# Patient Record
Sex: Male | Born: 1945 | Race: White | Hispanic: No | Smoking: Former smoker
Health system: Southern US, Community
[De-identification: ages and names within clinical notes are randomized; demographics above are authoritative.]

## PROBLEM LIST (undated history)

## (undated) DIAGNOSIS — I10 Essential (primary) hypertension: Secondary | ICD-10-CM

## (undated) DIAGNOSIS — C4491 Basal cell carcinoma of skin, unspecified: Secondary | ICD-10-CM

## (undated) DIAGNOSIS — G35 Multiple sclerosis: Secondary | ICD-10-CM

## (undated) DIAGNOSIS — F32A Depression, unspecified: Secondary | ICD-10-CM

## (undated) DIAGNOSIS — G4733 Obstructive sleep apnea (adult) (pediatric): Secondary | ICD-10-CM

## (undated) DIAGNOSIS — E785 Hyperlipidemia, unspecified: Secondary | ICD-10-CM

## (undated) DIAGNOSIS — Z9289 Personal history of other medical treatment: Secondary | ICD-10-CM

## (undated) DIAGNOSIS — R42 Dizziness and giddiness: Secondary | ICD-10-CM

## (undated) DIAGNOSIS — R7303 Prediabetes: Secondary | ICD-10-CM

## (undated) DIAGNOSIS — F329 Major depressive disorder, single episode, unspecified: Secondary | ICD-10-CM

## (undated) DIAGNOSIS — I2510 Atherosclerotic heart disease of native coronary artery without angina pectoris: Secondary | ICD-10-CM

## (undated) DIAGNOSIS — G35D Multiple sclerosis, unspecified: Secondary | ICD-10-CM

## (undated) HISTORY — PX: COLONOSCOPY: SHX174

## (undated) HISTORY — DX: Obstructive sleep apnea (adult) (pediatric): G47.33

## (undated) HISTORY — PX: TONSILLECTOMY: SHX5217

## (undated) HISTORY — DX: Atherosclerotic heart disease of native coronary artery without angina pectoris: I25.10

## (undated) HISTORY — PX: CATARACT EXTRACTION, BILATERAL: SHX1313

## (undated) HISTORY — DX: Multiple sclerosis, unspecified: G35.D

## (undated) HISTORY — DX: Essential (primary) hypertension: I10

## (undated) HISTORY — DX: Major depressive disorder, single episode, unspecified: F32.9

## (undated) HISTORY — DX: Dizziness and giddiness: R42

## (undated) HISTORY — DX: Hyperlipidemia, unspecified: E78.5

## (undated) HISTORY — DX: Personal history of other medical treatment: Z92.89

## (undated) HISTORY — DX: Multiple sclerosis: G35

## (undated) HISTORY — DX: Basal cell carcinoma of skin, unspecified: C44.91

## (undated) HISTORY — DX: Depression, unspecified: F32.A

---

## 1998-06-26 ENCOUNTER — Ambulatory Visit (HOSPITAL_COMMUNITY): Admission: RE | Admit: 1998-06-26 | Discharge: 1998-06-26 | Payer: Self-pay | Admitting: Internal Medicine

## 1998-06-26 ENCOUNTER — Encounter: Payer: Self-pay | Admitting: Internal Medicine

## 1999-01-14 HISTORY — PX: CORONARY STENT PLACEMENT: SHX1402

## 1999-05-16 ENCOUNTER — Inpatient Hospital Stay (HOSPITAL_COMMUNITY): Admission: RE | Admit: 1999-05-16 | Discharge: 1999-05-17 | Payer: Self-pay | Admitting: *Deleted

## 2003-02-05 ENCOUNTER — Ambulatory Visit (HOSPITAL_BASED_OUTPATIENT_CLINIC_OR_DEPARTMENT_OTHER): Admission: RE | Admit: 2003-02-05 | Discharge: 2003-02-05 | Payer: Self-pay | Admitting: Internal Medicine

## 2003-02-05 ENCOUNTER — Encounter: Payer: Self-pay | Admitting: Internal Medicine

## 2003-11-24 ENCOUNTER — Ambulatory Visit: Payer: Self-pay | Admitting: Psychology

## 2003-12-12 ENCOUNTER — Ambulatory Visit: Payer: Self-pay | Admitting: Psychology

## 2003-12-25 ENCOUNTER — Ambulatory Visit: Payer: Self-pay | Admitting: Psychology

## 2004-05-15 ENCOUNTER — Ambulatory Visit: Payer: Self-pay | Admitting: *Deleted

## 2004-12-26 ENCOUNTER — Ambulatory Visit: Payer: Self-pay | Admitting: *Deleted

## 2004-12-31 ENCOUNTER — Ambulatory Visit: Payer: Self-pay | Admitting: *Deleted

## 2005-08-07 ENCOUNTER — Ambulatory Visit: Payer: Self-pay | Admitting: Internal Medicine

## 2005-08-13 ENCOUNTER — Ambulatory Visit: Payer: Self-pay | Admitting: *Deleted

## 2005-08-20 ENCOUNTER — Ambulatory Visit: Payer: Self-pay | Admitting: *Deleted

## 2005-12-08 ENCOUNTER — Ambulatory Visit: Payer: Self-pay | Admitting: Internal Medicine

## 2005-12-08 LAB — CONVERTED CEMR LAB
ALT: 49 units/L — ABNORMAL HIGH (ref 0–40)
AST: 33 units/L (ref 0–37)
Albumin: 3.8 g/dL (ref 3.5–5.2)
Alkaline Phosphatase: 48 units/L (ref 39–117)
BUN: 14 mg/dL (ref 6–23)
Basophils Absolute: 0.1 10*3/uL (ref 0.0–0.1)
Basophils Relative: 0.8 % (ref 0.0–1.0)
Bilirubin Urine: NEGATIVE
CO2: 30 meq/L (ref 19–32)
Calcium: 9.3 mg/dL (ref 8.4–10.5)
Chloride: 110 meq/L (ref 96–112)
Chol/HDL Ratio, serum: 3.4
Cholesterol: 165 mg/dL (ref 0–200)
Creatinine, Ser: 1.1 mg/dL (ref 0.4–1.5)
Eosinophil percent: 4.3 % (ref 0.0–5.0)
GFR calc non Af Amer: 73 mL/min
Glomerular Filtration Rate, Af Am: 88 mL/min/{1.73_m2}
Glucose, Bld: 118 mg/dL — ABNORMAL HIGH (ref 70–99)
HCT: 45.9 % (ref 39.0–52.0)
HDL: 48.4 mg/dL (ref >39.0)
Hemoglobin, Urine: NEGATIVE
Hemoglobin: 15.7 g/dL (ref 13.0–17.0)
Ketones, ur: NEGATIVE mg/dL
LDL Cholesterol: 91 mg/dL (ref 0–99)
Leukocytes, UA: NEGATIVE
Lymphocytes Relative: 28.4 % (ref 12.0–46.0)
MCHC: 34.1 g/dL (ref 30.0–36.0)
MCV: 95 fL (ref 78.0–100.0)
Monocytes Absolute: 0.8 10*3/uL — ABNORMAL HIGH (ref 0.2–0.7)
Monocytes Relative: 10 % (ref 3.0–11.0)
Neutro Abs: 4.6 10*3/uL (ref 1.4–7.7)
Neutrophils Relative %: 56.5 % (ref 43.0–77.0)
Nitrite: NEGATIVE
PSA: 0.51 ng/mL (ref 0.10–4.00)
Platelets: 212 10*3/uL (ref 150–400)
Potassium: 5.5 meq/L — ABNORMAL HIGH (ref 3.5–5.1)
RBC: 4.83 M/uL (ref 4.22–5.81)
RDW: 12.4 % (ref 11.5–14.6)
Sodium: 143 meq/L (ref 135–145)
Specific Gravity, Urine: 1.025 (ref 1.000–1.03)
TSH: 1.89 microintl units/mL (ref 0.35–5.50)
Total Bilirubin: 0.9 mg/dL (ref 0.3–1.2)
Total Protein, Urine: NEGATIVE mg/dL
Total Protein: 6.3 g/dL (ref 6.0–8.3)
Triglyceride fasting, serum: 130 mg/dL (ref 0–149)
Urine Glucose: NEGATIVE mg/dL
Urobilinogen, UA: 0.2 (ref 0.0–1.0)
VLDL: 26 mg/dL (ref 0–40)
WBC: 8.2 10*3/uL (ref 4.5–10.5)
pH: 6 (ref 5.0–8.0)

## 2005-12-15 ENCOUNTER — Ambulatory Visit: Payer: Self-pay | Admitting: Internal Medicine

## 2006-01-23 ENCOUNTER — Ambulatory Visit: Payer: Self-pay | Admitting: Gastroenterology

## 2006-08-21 ENCOUNTER — Ambulatory Visit: Payer: Self-pay | Admitting: Internal Medicine

## 2006-09-22 ENCOUNTER — Ambulatory Visit: Payer: Self-pay | Admitting: Cardiology

## 2006-09-29 ENCOUNTER — Ambulatory Visit: Payer: Self-pay

## 2007-07-06 ENCOUNTER — Ambulatory Visit: Payer: Self-pay | Admitting: Internal Medicine

## 2007-07-06 DIAGNOSIS — R252 Cramp and spasm: Secondary | ICD-10-CM | POA: Insufficient documentation

## 2007-07-06 DIAGNOSIS — I2510 Atherosclerotic heart disease of native coronary artery without angina pectoris: Secondary | ICD-10-CM | POA: Insufficient documentation

## 2007-07-06 DIAGNOSIS — I10 Essential (primary) hypertension: Secondary | ICD-10-CM | POA: Insufficient documentation

## 2007-07-06 DIAGNOSIS — I152 Hypertension secondary to endocrine disorders: Secondary | ICD-10-CM | POA: Insufficient documentation

## 2007-07-06 DIAGNOSIS — F339 Major depressive disorder, recurrent, unspecified: Secondary | ICD-10-CM | POA: Insufficient documentation

## 2007-07-07 ENCOUNTER — Ambulatory Visit: Payer: Self-pay | Admitting: Internal Medicine

## 2007-07-07 LAB — CONVERTED CEMR LAB
ALT: 33 units/L (ref 0–53)
AST: 21 units/L (ref 0–37)
Albumin: 3.7 g/dL (ref 3.5–5.2)
Alkaline Phosphatase: 48 units/L (ref 39–117)
BUN: 14 mg/dL (ref 6–23)
Bilirubin, Direct: 0.1 mg/dL (ref 0.0–0.3)
CO2: 26 meq/L (ref 19–32)
Calcium: 9.2 mg/dL (ref 8.4–10.5)
Chloride: 107 meq/L (ref 96–112)
Cholesterol: 189 mg/dL (ref 0–200)
Creatinine, Ser: 1 mg/dL (ref 0.4–1.5)
GFR calc Af Amer: 98 mL/min
GFR calc non Af Amer: 81 mL/min
Glucose, Bld: 120 mg/dL — ABNORMAL HIGH (ref 70–99)
HDL: 61.1 mg/dL (ref >39.0)
LDL Cholesterol: 91 mg/dL (ref 0–99)
Magnesium: 2.1 mg/dL (ref 1.5–2.5)
Potassium: 4.2 meq/L (ref 3.5–5.1)
Sodium: 141 meq/L (ref 135–145)
Total Bilirubin: 0.8 mg/dL (ref 0.3–1.2)
Total CHOL/HDL Ratio: 3.1
Total Protein: 6.7 g/dL (ref 6.0–8.3)
Triglycerides: 185 mg/dL — ABNORMAL HIGH (ref 0–149)
VLDL: 37 mg/dL (ref 0–40)

## 2007-07-12 ENCOUNTER — Encounter: Payer: Self-pay | Admitting: Internal Medicine

## 2007-07-30 ENCOUNTER — Telehealth: Payer: Self-pay | Admitting: Internal Medicine

## 2007-08-11 ENCOUNTER — Ambulatory Visit: Payer: Self-pay | Admitting: Psychology

## 2007-08-11 ENCOUNTER — Ambulatory Visit: Payer: Self-pay | Admitting: Internal Medicine

## 2007-08-24 ENCOUNTER — Ambulatory Visit: Payer: Self-pay | Admitting: Psychology

## 2007-09-09 ENCOUNTER — Ambulatory Visit: Payer: Self-pay | Admitting: Psychology

## 2007-09-30 ENCOUNTER — Ambulatory Visit: Payer: Self-pay | Admitting: Psychology

## 2007-10-29 ENCOUNTER — Ambulatory Visit: Payer: Self-pay | Admitting: Psychology

## 2007-11-02 ENCOUNTER — Telehealth: Payer: Self-pay | Admitting: Internal Medicine

## 2007-11-05 ENCOUNTER — Ambulatory Visit: Payer: Self-pay | Admitting: Cardiovascular Disease

## 2007-11-15 ENCOUNTER — Telehealth: Payer: Self-pay | Admitting: Internal Medicine

## 2007-11-16 ENCOUNTER — Ambulatory Visit: Payer: Self-pay

## 2007-11-26 ENCOUNTER — Ambulatory Visit: Payer: Self-pay | Admitting: Psychology

## 2007-11-30 ENCOUNTER — Ambulatory Visit: Payer: Self-pay | Admitting: Cardiovascular Disease

## 2008-11-09 DIAGNOSIS — E1169 Type 2 diabetes mellitus with other specified complication: Secondary | ICD-10-CM | POA: Insufficient documentation

## 2008-11-09 DIAGNOSIS — E785 Hyperlipidemia, unspecified: Secondary | ICD-10-CM | POA: Insufficient documentation

## 2008-11-10 ENCOUNTER — Ambulatory Visit: Payer: Self-pay | Admitting: Cardiovascular Disease

## 2009-01-01 ENCOUNTER — Telehealth: Payer: Self-pay | Admitting: Internal Medicine

## 2009-01-02 ENCOUNTER — Telehealth: Payer: Self-pay | Admitting: Internal Medicine

## 2009-01-26 ENCOUNTER — Telehealth: Payer: Self-pay | Admitting: Internal Medicine

## 2009-05-28 ENCOUNTER — Ambulatory Visit: Payer: Self-pay | Admitting: Internal Medicine

## 2009-07-03 ENCOUNTER — Telehealth: Payer: Self-pay | Admitting: Internal Medicine

## 2009-08-03 ENCOUNTER — Encounter: Payer: Self-pay | Admitting: Internal Medicine

## 2009-08-06 ENCOUNTER — Telehealth: Payer: Self-pay | Admitting: Internal Medicine

## 2009-08-13 ENCOUNTER — Telehealth: Payer: Self-pay | Admitting: Internal Medicine

## 2009-08-20 ENCOUNTER — Ambulatory Visit: Payer: Self-pay | Admitting: Pulmonary Disease

## 2009-08-20 DIAGNOSIS — G4733 Obstructive sleep apnea (adult) (pediatric): Secondary | ICD-10-CM | POA: Insufficient documentation

## 2009-08-21 ENCOUNTER — Telehealth: Payer: Self-pay | Admitting: Pulmonary Disease

## 2009-08-29 ENCOUNTER — Encounter: Payer: Self-pay | Admitting: Pulmonary Disease

## 2009-09-24 ENCOUNTER — Encounter: Payer: Self-pay | Admitting: Cardiovascular Disease

## 2009-09-25 ENCOUNTER — Encounter: Admission: RE | Admit: 2009-09-25 | Discharge: 2009-09-25 | Payer: Self-pay | Admitting: Otolaryngology

## 2009-10-29 ENCOUNTER — Encounter: Payer: Self-pay | Admitting: Cardiovascular Disease

## 2009-10-29 ENCOUNTER — Ambulatory Visit: Payer: Self-pay | Admitting: Cardiovascular Disease

## 2009-10-29 DIAGNOSIS — R079 Chest pain, unspecified: Secondary | ICD-10-CM | POA: Insufficient documentation

## 2009-10-29 DIAGNOSIS — R0602 Shortness of breath: Secondary | ICD-10-CM | POA: Insufficient documentation

## 2009-11-06 ENCOUNTER — Telehealth (INDEPENDENT_AMBULATORY_CARE_PROVIDER_SITE_OTHER): Payer: Self-pay | Admitting: Radiology

## 2009-11-07 ENCOUNTER — Encounter: Payer: Self-pay | Admitting: Cardiovascular Disease

## 2009-11-07 ENCOUNTER — Ambulatory Visit: Payer: Self-pay | Admitting: Cardiology

## 2009-11-07 ENCOUNTER — Ambulatory Visit (HOSPITAL_COMMUNITY): Admission: RE | Admit: 2009-11-07 | Discharge: 2009-11-07 | Payer: Self-pay | Admitting: Cardiovascular Disease

## 2009-11-07 ENCOUNTER — Encounter (HOSPITAL_COMMUNITY)
Admission: RE | Admit: 2009-11-07 | Discharge: 2010-01-12 | Payer: Self-pay | Source: Home / Self Care | Attending: Cardiovascular Disease | Admitting: Cardiovascular Disease

## 2009-11-07 ENCOUNTER — Ambulatory Visit: Payer: Self-pay

## 2009-11-07 ENCOUNTER — Ambulatory Visit: Payer: Self-pay | Admitting: Cardiovascular Disease

## 2009-12-13 ENCOUNTER — Telehealth: Payer: Self-pay | Admitting: Internal Medicine

## 2010-02-07 ENCOUNTER — Ambulatory Visit
Admission: RE | Admit: 2010-02-07 | Discharge: 2010-02-07 | Payer: Self-pay | Source: Home / Self Care | Attending: Internal Medicine | Admitting: Internal Medicine

## 2010-02-12 NOTE — Assessment & Plan Note (Signed)
Summary: F1Y/DM  Medications Added IMIQUIMOD 5 % CREA (IMIQUIMOD) as directed TRIAMTERENE-HCTZ 37.5-25 MG TABS (TRIAMTERENE-HCTZ) 1 by mouth daily PRAVASTATIN SODIUM 80 MG TABS (PRAVASTATIN SODIUM) take 1 tablet every evening      Allergies Added: NKDA  Referring Provider:  Dr. Debby Bud, Dr. Eden Emms Primary Provider:  Jacques Navy MD   History of Present Illness: Roy [name] is seen today for F/U CAD, HTN, and elevated lipids.  He had a stent to the RCA in 2001. He had a non-ischemic myovue in 2009.  He is not having any SSCP.  He is compliant with his meds and indicates that Dr. Arthur Holms is following his lipids.  He is unemployed now and very depressed about it.  He has been on prozac and this may need to be changes.  He denies dyspnea, palpitations or edema.  He has had some positions vertigo with certain neck positions 1/month.  He is a non-smoker He is going throug a separation with his wife and this is very stressful.  He has had some chest pressure with stress and some walking.  Previous angina was bilateral arm tingling.  Also marked increase in SOB and fatigue.  Recent decreased hearing especially in left ear.  Seeing Lovey Newcomer and had lab work recently.  Has seen Sood for SOB but does not want to wear CPAP  Current Problems (verified): 1)  Obstructive Sleep Apnea  (ICD-327.23) 2)  Coronary Artery Disease  (ICD-414.00) 3)  Hypercholesterolemia  (ICD-272.0) 4)  Hypertension  (ICD-401.9) 5)  Hyperlipidemia  (ICD-272.4) 6)  Depression  (ICD-311) 7)  Cramp in Limb  (ICD-729.82)  Current Medications (verified): 1)  Lisinopril 5 Mg Tabs (Lisinopril) .... Take One Tablet By Mouth Daily 2)  Simvastatin 80 Mg  Tabs (Simvastatin) .... 1/2 Tab By Mouth Once Daily 3)  Lunesta 3 Mg Tabs (Eszopiclone) .... Use As Directed As Needed 4)  Carvedilol 3.125 Mg Tabs (Carvedilol) .... Take One Tablet By Mouth Twice A Day 5)  Alprazolam 0.25 Mg  Tabs (Alprazolam) .... Take 1 Tablet By Mouth Three Times A  Day As Needed 6)  Aspirin 81 Mg  Tabs (Aspirin) .... Take 1 Tablet By Mouth Once A Day 7)  Fluoxetine Hcl 40 Mg  Caps (Fluoxetine Hcl) .Marland Kitchen.. 1 By Mouth Once Daily 8)  Nitrostat 0.4 Mg Subl (Nitroglycerin) .Marland Kitchen.. 1 Tablet Under Tongue At Onset of Chest Pain; You May Repeat Every 5 Minutes For Up To 3 Doses. 9)  Imiquimod 5 % Crea (Imiquimod) .... As Directed 10)  Triamterene-Hctz 37.5-25 Mg Tabs (Triamterene-Hctz) .Marland Kitchen.. 1 By Mouth Daily  Allergies (verified): No Known Drug Allergies  Past History:  Past Medical History: Last updated: 08/20/2009 Current Problems:  CORONARY ARTERY DISEASE (ICD-414.00) HYPERCHOLESTEROLEMIA (ICD-272.0) HYPERTENSION (ICD-401.9) HYPERLIPIDEMIA (ICD-272.4) DEPRESSION (ICD-311) Relative bradycardia may be contributing to fatigue Obstructive sleep apnea      - PSG 02/05/03 RDI 36 Cramps-Generalized  Past Surgical History: Last updated: 11/09/2008 Tonsillectomy   coronary artery bypass surgery I believe in 2001   Successful PTCA with stent placement in the distal right coronary artery reducing an 80% stenosis to 0% residual with TIMI-3 flow.PLAN:  Integrilin will be continued for 18 hours.  Plavix will be administered for four weeks.DD:  05/03/01TD:  05/07/01Job: [id number]:  Alric Quan CC:  Sonda Primes, M.D. [facility] Cecil Cranker, M.D. [facility] Catheterization Lab  Family History: Last updated: 07/06/2007 Coronary artery disease in his father. Vascular disease, periperal in his father. Mother had cirrhosis  Social History: Last updated: 05/28/2009  The patient is currently working Hydrographic surveyor.  He reports that his marriage is problematic and they have been to counseling. Had to stop due to loss of insurance. He is a grandfather.   Review of Systems       Denies fever, malais, weight loss, blurry vision, decreased visual acuity, cough, sputum,  hemoptysis, pleuritic pain, palpitaitons, heartburn,  abdominal pain, melena, lower extremity edema, claudication, or rash.   Vital Signs:  Patient profile:   65 year old male Height:      66 inches Weight:      165 pounds BMI:     26.73 Pulse rate:   102 / minute Resp:     16 per minute BP sitting:   100 / 62  (left arm)  Vitals Entered By: Marrion Coy, CNA (October 29, 2009 11:45 AM)  Physical Exam  General:  Affect appropriate Healthy:  appears stated age HEENT: normal Neck supple with no adenopathy JVP normal no bruits no thyromegaly Lungs clear with no wheezing and good diaphragmatic motion Heart:  S1/S2 no murmur,rub, gallop or click PMI normal Abdomen: benighn, BS positve, no tenderness, no AAA no bruit.  No HSM or HJR Distal pulses intact with no bruits No edema Neuro non-focal Skin warm and dry    Impression & Recommendations:  Problem # 1:  CORONARY ARTERY DISEASE (ICD-414.00)  Pressure with stress and anxiety.  Continue meds  Stress myovue His updated medication list for this problem includes:    Lisinopril 5 Mg Tabs (Lisinopril) .Marland Kitchen... Take one tablet by mouth daily    Carvedilol 3.125 Mg Tabs (Carvedilol) .Marland Kitchen... Take one tablet by mouth twice a day    Aspirin 81 Mg Tabs (Aspirin) .Marland Kitchen... Take 1 tablet by mouth once a day    Nitrostat 0.4 Mg Subl (Nitroglycerin) .Marland Kitchen... 1 tablet under tongue at onset of chest pain; you may repeat every 5 minutes for up to 3 doses.  His updated medication list for this problem includes:    Lisinopril 5 Mg Tabs (Lisinopril) .Marland Kitchen... Take one tablet by mouth daily    Carvedilol 3.125 Mg Tabs (Carvedilol) .Marland Kitchen... Take one tablet by mouth twice a day    Aspirin 81 Mg Tabs (Aspirin) .Marland Kitchen... Take 1 tablet by mouth once a day    Nitrostat 0.4 Mg Subl (Nitroglycerin) .Marland Kitchen... 1 tablet under tongue at onset of chest pain; you may repeat every 5 minutes for up to 3 doses.  Problem # 2:  HYPERTENSION (ICD-401.9)  Well controlled His updated medication list for this problem includes:    Lisinopril  5 Mg Tabs (Lisinopril) .Marland Kitchen... Take one tablet by mouth daily    Carvedilol 3.125 Mg Tabs (Carvedilol) .Marland Kitchen... Take one tablet by mouth twice a day    Aspirin 81 Mg Tabs (Aspirin) .Marland Kitchen... Take 1 tablet by mouth once a day    Triamterene-hctz 37.5-25 Mg Tabs (Triamterene-hctz) .Marland Kitchen... 1 by mouth daily  His updated medication list for this problem includes:    Lisinopril 5 Mg Tabs (Lisinopril) .Marland Kitchen... Take one tablet by mouth daily    Carvedilol 3.125 Mg Tabs (Carvedilol) .Marland Kitchen... Take one tablet by mouth twice a day    Aspirin 81 Mg Tabs (Aspirin) .Marland Kitchen... Take 1 tablet by mouth once a day    Triamterene-hctz 37.5-25 Mg Tabs (Triamterene-hctz) .Marland Kitchen... 1 by mouth daily  Problem # 3:  HYPERCHOLESTEROLEMIA (ICD-272.0) Change to pravachol given FDA warning on high dose simvastatin The following medications were removed from the medication list:  Simvastatin 80 Mg Tabs (Simvastatin) .Marland Kitchen... 1/2 tab by mouth once daily His updated medication list for this problem includes:    Pravastatin Sodium 80 Mg Tabs (Pravastatin sodium) .Marland Kitchen... Take 1 tablet every evening  CHOL: 189 (07/07/2007)   LDL: 91 (07/07/2007)   HDL: 61.1 (07/07/2007)   TG: 185 (07/07/2007)  Problem # 4:  DYSPNEA (ICD-786.05)  No signs of CHF Prev normal EF  Echo.  Get lab work from [place] to review His updated medication list for this problem includes:    Lisinopril 5 Mg Tabs (Lisinopril) .Marland Kitchen... Take one tablet by mouth daily    Carvedilol 3.125 Mg Tabs (Carvedilol) .Marland Kitchen... Take one tablet by mouth twice a day    Aspirin 81 Mg Tabs (Aspirin) .Marland Kitchen... Take 1 tablet by mouth once a day    Triamterene-hctz 37.5-25 Mg Tabs (Triamterene-hctz) .Marland Kitchen... 1 by mouth daily  Orders: Echocardiogram (Echo)  His updated medication list for this problem includes:    Lisinopril 5 Mg Tabs (Lisinopril) .Marland Kitchen... Take one tablet by mouth daily    Carvedilol 3.125 Mg Tabs (Carvedilol) .Marland Kitchen... Take one tablet by mouth twice a day    Aspirin 81 Mg Tabs (Aspirin) .Marland Kitchen... Take 1  tablet by mouth once a day    Triamterene-hctz 37.5-25 Mg Tabs (Triamterene-hctz) .Marland Kitchen... 1 by mouth daily  Other Orders: Nuclear Stress Test (Nuc Stress Test)  Patient Instructions: 1)  Your physician recommends that you schedule a follow-up appointment in: we will call you 2)  Your physician has recommended you make the following change in your medication: please discontinue your simvastatin and start pravastatin 80mg  each evening 3)  Your physician has requested that you have an echocardiogram.  Echocardiography is a painless test that uses sound waves to create images of your heart. It provides your doctor with information about the size and shape of your heart and how well your heart's chambers and valves are working.  This procedure takes approximately one hour. There are no restrictions for this procedure. 4)  Your physician has requested that you have an exercise stress myoview.  For further information please visit [email or web address]  Please follow instruction sheet, as given. Prescriptions: CARVEDILOL 3.125 MG TABS (CARVEDILOL) Take one tablet by mouth twice a day  #180 x 3   Entered by:   Ledon Snare, RN   Authorized by:   Colon Branch, MD, [facility]   Signed by:   Ledon Snare, RN on 10/29/2009   Method used:   Electronically to        [facility]* (retail)       [address]       [place], [place]  [id number]       Ph: [phone]       Fax: [phone]   RxID:   [id number] LISINOPRIL 5 MG TABS (LISINOPRIL) Take one tablet by mouth daily  #90 x 3   Entered by:   Ledon Snare, RN   Authorized by:   Colon Branch, MD, [facility]   Signed by:   Ledon Snare, RN on 10/29/2009   Method used:   Electronically to        [facility]* (retail)       [address]       [place], [place]  [id number]       Ph: [phone]       Fax: [phone]   RxID:   [id number] PRAVASTATIN SODIUM 80 MG TABS (PRAVASTATIN SODIUM) take 1 tablet  every evening  #  90 x 3   Entered by:   Ledon Snare, RN   Authorized by:   Colon Branch, MD, [facility]   Signed by:   Ledon Snare, RN on 10/29/2009   Method used:   Electronically to        [facility]* (retail)       [address]       [place], [place]  [id number]       Ph: [phone]       Fax: [phone]   RxID:   [id number]

## 2010-02-12 NOTE — Assessment & Plan Note (Signed)
Summary: SLEEP APNEA///kp   Visit Type:  Initial Consult Copy to:  Dr. Debby Bud, Dr. Eden Emms Primary Provider/Referring Provider:  Jacques Navy MD  CC:  sleep consult. Epworth score is 5. The patient had sleep study in 2005.Marland Kitchen  History of Present Illness: 65 yo male with sleep difficulties.  He has noticed problems with his sleep for years.  His wife has told him that he snores, and stops breathing while asleep.  He had a sleep test in January of 2005, and was found to have severe sleep apnea.  However, he was under the impression that his sleep apnea was mild, and nothing further needed to be done.  He has not been on any specific therapy for sleep apnea.  His symptoms have persisted, and as a result he contacted his primary physician to determine if he needed CPAP.  As a result sleep consultation was requested.  He goes to bed at 10pm.  It can sometimes take up to an hour to fall asleep.  He has lunesta, but does not use this much.  He has a lot on his mind.  He is worried he may lose his job soon, and he is separated from his wife.  Thinking about these things can make it difficult for him to fall asleep.  Once he is asleep he wakes up once to use the bathroom, but then doesnt have too much trouble falling back to sleep.  He gets out of bed at 7am.  He usually feels tired, and has been feeling more depressed lately.  He denies headaches.  As the day goes on he will feel more tired, and will sometimes nap in the late afternoon.  He does a lot of driving with his work, and will sometimes get sleepy while driving.  When this happens he will pull to the side of the road and nap.  He does not dream much.  He denies sleep walking, sleep talking, or bruxism.  There is no history of restless legs, but he does get frequent leg cramps.  He denies sleep hallucinations, sleep paralysis, or cataplexy.  He drinks one cup of coffee in the morning.  He will drink one to two glasses of alcohol per day, but never  to help sleep.  His weight has been steady.  There is no history of thyroid disease.   Preventive Screening-Counseling & Management  Alcohol-Tobacco     Alcohol drinks/day: 2     Smoking Status: quit     Packs/Day: 1.5     Year Started: 1963     Year Quit: 2006  Comments: The patient says he quit  2 other times during his years of smoking.Michel Bickers [facility]  August 20, 2009 2:27 PM  Current Medications (verified): 1)  Lisinopril 5 Mg Tabs (Lisinopril) .... Take One Tablet By Mouth Daily 2)  Simvastatin 80 Mg  Tabs (Simvastatin) .... 1/2 Tab By Mouth Once Daily 3)  Lunesta 3 Mg Tabs (Eszopiclone) .... Use As Directed As Needed 4)  Carvedilol 3.125 Mg Tabs (Carvedilol) .... Take One Tablet By Mouth Twice A Day 5)  Alprazolam 0.25 Mg  Tabs (Alprazolam) .... Take 1 Tablet By Mouth Three Times A Day As Needed 6)  Aspirin 81 Mg  Tabs (Aspirin) .... Take 1 Tablet By Mouth Once A Day 7)  Fluoxetine Hcl 40 Mg  Caps (Fluoxetine Hcl) .Marland Kitchen.. 1 By Mouth Once Daily 8)  Nitrostat 0.4 Mg Subl (Nitroglycerin) .Marland Kitchen.. 1 Tablet Under Tongue At Onset of Chest  Pain; You May Repeat Every 5 Minutes For Up To 3 Doses.  Allergies (verified): No Known Drug Allergies  Past History:  Past Medical History: Current Problems:  CORONARY ARTERY DISEASE (ICD-414.00) HYPERCHOLESTEROLEMIA (ICD-272.0) HYPERTENSION (ICD-401.9) HYPERLIPIDEMIA (ICD-272.4) DEPRESSION (ICD-311) Relative bradycardia may be contributing to fatigue Obstructive sleep apnea      - PSG 02/05/03 RDI 36 Cramps-Generalized  Past Surgical History: Reviewed history from 11/09/2008 and no changes required. Tonsillectomy   coronary artery bypass surgery I believe in 2001   Successful PTCA with stent placement in the distal right coronary artery reducing an 80% stenosis to 0% residual with TIMI-3 flow.PLAN:  Integrilin will be continued for 18 hours.  Plavix will be administered for four weeks.DD:  05/03/01TD:  05/07/01Job: [id number]:   Alric Quan CC:  Sonda Primes, M.D. [facility] Cecil Cranker, M.D. [facility] Catheterization Lab  Family History: Reviewed history from 07/06/2007 and no changes required. Coronary artery disease in his father. Vascular disease, periperal in his father. Mother had cirrhosis  Social History: Reviewed history from 05/28/2009 and no changes required. The patient is currently working Hydrographic surveyor.  He reports that his marriage is problematic and they have been to counseling. Had to stop due to loss of insurance. He is a grandfather. Alcohol drinks/day:  2 Packs/Day:  1.5  Review of Systems       The patient complains of shortness of breath with activity, productive cough, anxiety, and depression.  The patient denies shortness of breath at rest, non-productive cough, coughing up blood, chest pain, irregular heartbeats, acid heartburn, indigestion, loss of appetite, weight change, abdominal pain, difficulty swallowing, sore throat, tooth/dental problems, headaches, nasal congestion/difficulty breathing through nose, sneezing, itching, ear ache, hand/feet swelling, joint stiffness or pain, rash, change in color of mucus, and fever.    Vital Signs:  Patient profile:   65 year old male Height:      66 inches (167.64 cm) Weight:      171 pounds (77.73 kg) BMI:     27.70 O2 Sat:      96 % on Room air Temp:     98.5 degrees F (36.94 degrees C) oral Pulse rate:   87 / minute BP sitting:   106 / 82  (right arm) Cuff size:   regular  Vitals Entered By: Michel Bickers CMA (August 20, 2009 2:23 PM)  O2 Sat at Rest %:  96 O2 Flow:  Room air CC: sleep consult. Epworth score is 5. The patient had sleep study in 2005.   Physical Exam  General:  normal appearance and healthy appearing.   Eyes:  PERRLA and EOMI.   Nose:  no deformity, discharge, inflammation, or lesions Mouth:  MP 2, decreased AP diameter, scalloped tongue Neck:  no JVD.   Chest Wall:  no  deformities noted Lungs:  clear bilaterally to auscultation and percussion Heart:  regular rate and rhythm, S1, S2 without murmurs, rubs, gallops, or clicks Abdomen:  bowel sounds positive; abdomen soft and non-tender without masses, or organomegaly Msk:  no deformity or scoliosis noted with normal posture Pulses:  pulses normal Extremities:  no clubbing, cyanosis, edema, or deformity noted Neurologic:  normal CN II-XII and strength normal.   Cervical Nodes:  no significant adenopathy Psych:  depressed affect.     Impression & Recommendations:  Problem # 1:  OBSTRUCTIVE SLEEP APNEA (ICD-327.23) He had his sleep study in 2005.  This showed severe sleep apnea.  He has continued symptoms of  sleep disruption and daytime sleepiness.  He also has a history of cardiovascular disease and depression.    I reviewed his sleep test with him.  I explained how sleep apnea can affect his health.  Driving precautions were discussed.  Explained the need to maintain a reasonable weight.    Treatment options were discussed.  He would like to pursue an oral appliance if this is feasible.  I explained that he would need to check with his dentist to determine if he is a candidate for an oral appliance.  He will also need to check with his insurance company to determine if he has coverage for an oral appliance.  Explained that he would need repeat testing after he was fitted for the oral appliance.  Also explained that his insurance company may require him to repeat his sleep test since his original study was 6 years ago.  If he can not get fitted for an oral appliance, then he will call me to proceed with setting up CPAP.  Of note is that he may lose his job soon, and he is not sure what he will be able to do about medical insurance.  Problem # 2:  DEPRESSION (ICD-311) He has continued anxiety and depression.  Much of this is related to his marital status and work situation.  Hopefully, treating his sleep apnea  will help to some degree.  He is to follow up with primary care for further assessment of his depression.  Complete Medication List: 1)  Lisinopril 5 Mg Tabs (Lisinopril) .... Take one tablet by mouth daily 2)  Simvastatin 80 Mg Tabs (Simvastatin) .... 1/2 tab by mouth once daily 3)  Lunesta 3 Mg Tabs (Eszopiclone) .... Use as directed as needed 4)  Carvedilol 3.125 Mg Tabs (Carvedilol) .... Take one tablet by mouth twice a day 5)  Alprazolam 0.25 Mg Tabs (Alprazolam) .... Take 1 tablet by mouth three times a day as needed 6)  Aspirin 81 Mg Tabs (Aspirin) .... Take 1 tablet by mouth once a day 7)  Fluoxetine Hcl 40 Mg Caps (Fluoxetine hcl) .Marland Kitchen.. 1 by mouth once daily 8)  Nitrostat 0.4 Mg Subl (Nitroglycerin) .Marland Kitchen.. 1 tablet under tongue at onset of chest pain; you may repeat every 5 minutes for up to 3 doses.  Other Orders: Consultation Level IV ([address])  Patient Instructions: 1)  Check with your dentist and insurance company to see if you are a candidate for an oral appliance (mandibular advancement device) for the treatment of sleep apnea. 2)  Follow up in 4 to 6 weeks

## 2010-02-12 NOTE — Progress Notes (Signed)
Summary: ORDER  Phone Note Call from Patient Call back at Home Phone [phone]   Summary of Call: Patient left message on triage that he needs script for cpap. Please call when ready and it is ok to speak with patient spouse Rod Holler. Initial call taken by: Lucious Groves,  July 03, 2009 10:58 AM  Follow-up for Phone Call        reviewed EMR - no documentation of sleep apnea or sleep evaluation. I will need some doucmentation before I can write an order for CPAP Follow-up by: Jacques Navy MD,  July 03, 2009 5:21 PM  Additional Follow-up for Phone Call Additional follow up Details #1::        left message on voicemail to call back to office.Lucious Groves,  July 04, 2009 9:50 AM    Additional Follow-up for Phone Call Additional follow up Details #2::    pt went to sleep center at [facility]. Pt states this was about 3 years ago. What do you advise Follow-up by: Ami Bullins CMA,  July 04, 2009 3:00 PM  Additional Follow-up for Phone Call Additional follow up Details #3:: Details for Additional Follow-up Action Taken: call the sleep center and have them send me a copy of the report. Will also need paper chart to see if he had pulmonary consult.   Thanks  Rx done Additional Follow-up by: Jacques Navy MD,  July 05, 2009 9:29 AM  spoke with Sleep center. They are faxing over Pt's last sleep study/Ami Bullins CMA  August 03, 2009 3:45 PM  recieved fax from sleep center/Ami Bullins CMA  August 03, 2009 3:54 PM

## 2010-02-12 NOTE — Assessment & Plan Note (Signed)
Summary: Cardiology Nuclear Testing  Nuclear Med Background Indications for Stress Test: Evaluation for Ischemia, Graft Patency  Indications Comments: OSA  History: Angioplasty, CABG, Echo, Myocardial Perfusion Study, Stents  History Comments: '01 CABG, STENT-RCA, ECHO NML EF, MPI NO ISCH.   Symptoms: Chest Pain, Chest Pressure with Exertion, Fatigue, Palpitations, SOB    Nuclear Pre-Procedure Cardiac Risk Factors: Family History - CAD, Hypertension, Lipids Caffeine/Decaff Intake: None NPO After: 7:30 PM Lungs: clear IV 0.9% NS with Angio Cath: 22g     IV Site: R Wrist IV Started by: Bonnita Levan, RN Chest Size (in) 40     Height (in): 66 Weight (lb): 169 BMI: 27.38  Nuclear Med Study 1 or 2 day study:  1 day     Stress Test Type:  Stress Reading MD:  Charlton Haws, MD     Referring MD:  P.Nishan Resting Radionuclide:  Technetium 44m Tetrofosmin     Resting Radionuclide Dose:  11 mCi  Stress Radionuclide:  Technetium 71m Tetrofosmin     Stress Radionuclide Dose:  32.9 mCi   Stress Protocol Exercise Time (min):  5:00 min     Max HR:  153 bpm     Predicted Max HR:  157 bpm  Max Systolic BP: 152 mm Hg     Percent Max HR:  97.45 %     METS: 7.2 Rate Pressure Product:  62130    Stress Test Technologist:  Milana Na, EMT-P     Nuclear Technologist:  Domenic Polite, CNMT  Rest Procedure  Myocardial perfusion imaging was performed at rest 45 minutes following the intravenous administration of Technetium 38m Tetrofosmin.  Stress Procedure  The patient exercised for 5:00. The patient stopped due to fatigue, sob, and denied any chest pain.  There were no significant ST-T wave changes and a rare pac.  Technetium 43m Tetrofosmin was injected at peak exercise and myocardial perfusion imaging was performed after a brief delay.  QPS Raw Data Images:  Normal; no motion artifact; normal heart/lung ratio. Stress Images:  Normal homogeneous uptake in all areas of the  myocardium. Rest Images:  Normal homogeneous uptake in all areas of the myocardium. Subtraction (SDS):  Normal Transient Ischemic Dilatation:  0.91  (Normal <1.22)  Lung/Heart Ratio:  0.20  (Normal <0.45)  Quantitative Gated Spect Images QGS EDV:  70 ml QGS ESV:  22 ml QGS EF:  68 % QGS cine images:  normal  Findings Normal nuclear study      Overall Impression  Exercise Capacity: Fair exercise capacity. BP Response: Normal blood pressure response. Clinical Symptoms: Dyspnea ECG Impression: No significant ST segment change suggestive of ischemia. Overall Impression: Normal stress nuclear study.  Appended Document: Cardiology Nuclear Testing normal nulcear  Appended Document: Cardiology Nuclear Testing pt aware of results

## 2010-02-12 NOTE — Progress Notes (Signed)
  Phone Note Refill Request Message from:  Fax from Pharmacy on December 13, 2009 2:40 PM  Refills Requested: Medication #1:  LUNESTA 3 MG TABS use as directed as needed   Dosage confirmed as above?Dosage Confirmed   Supply Requested: 1 month   Last Refilled: 01/26/2009  Medication #2:  ALPRAZOLAM 0.25 MG  TABS Take 1 tablet by mouth three times a day as needed  Is this ok to refill? [facility] pharmacy  Initial call taken by: Rock Nephew CMA,  December 13, 2009 2:40 PM  Follow-up for Phone Call        1. refill as needed 2. refll x 5 Follow-up by: Jacques Navy MD,  December 13, 2009 3:50 PM    Prescriptions: ALPRAZOLAM 0.25 MG  TABS (ALPRAZOLAM) Take 1 tablet by mouth three times a day as needed  #90 x 5   Entered by:   Ami Bullins CMA   Authorized by:   Jacques Navy MD   Signed by:   Bill Salinas CMA on 12/13/2009   Method used:   Telephoned to ...       [facility]* (retail)       [address]       [place], [place]  [id number]       Ph: [phone]       Fax: [phone]   RxID:   [id number] LUNESTA 3 MG TABS (ESZOPICLONE) use as directed as needed  #90 x 0   Entered by:   Bill Salinas CMA   Authorized by:   Jacques Navy MD   Signed by:   Bill Salinas CMA on 12/13/2009   Method used:   Telephoned to ...       [facility]* (retail)       [address]       [place], [place]  [id number]       Ph: [phone]       Fax: [phone]   RxID:   [id number]

## 2010-02-12 NOTE — Progress Notes (Signed)
Summary: urgent due to ins  Phone Note Call from Patient Call back at [phone]   Caller: Patient Call For: sood Reason for Call: Talk to Nurse Summary of Call: pt has decided to go with cpap machine.  Needs to be done asap - pt about to lose job. Initial call taken by: Eugene Gavia,  August 21, 2009 9:57 AM  Follow-up for Phone Call        Per OV with VS pending this phone call for next step. Will forward to VS for review. Please advise. Thanks. Zackery Barefoot CMA  August 21, 2009 10:03 AM   Additional Follow-up for Phone Call Additional follow up Details #1::        will send order for [facility] to arrange for autoCPAP. Additional Follow-up by: Coralyn Helling MD,  August 21, 2009 10:06 AM    Additional Follow-up for Phone Call Additional follow up Details #2::    Pt informed. Zackery Barefoot CMA  August 21, 2009 10:11 AM

## 2010-02-12 NOTE — Progress Notes (Signed)
Summary: CPAP  Phone Note Call from Patient Call back at [phone]   Summary of Call: Spoke w/pt. He had sleep study approx 4 years ago, See previous phone notes report must be in paper chart. Pt has never had a CPAP or any other sleep machine. Will he need another study? pulmonary referral? Please advise  Initial call taken by: Lamar Sprinkles, CMA,  August 13, 2009 3:55 PM  Follow-up for Phone Call        needs to see sleep doc. Referral made - [facility] notified. Follow-up by: Jacques Navy MD,  August 13, 2009 5:52 PM  Additional Follow-up for Phone Call Additional follow up Details #1::        left message with pt's wife for pt to call back Additional Follow-up by: Ami Bullins CMA,  August 14, 2009 8:24 AM  New Problems: SLEEP APNEA (ICD-780.57)   Additional Follow-up for Phone Call Additional follow up Details #2::    lmoam for pt to call back Follow-up by: Ami Bullins CMA,  August 15, 2009 11:01 AM  Additional Follow-up for Phone Call Additional follow up Details #3:: Details for Additional Follow-up Action Taken: Pt has had appt with sleep MD Additional Follow-up by: Ami Bullins CMA,  August 21, 2009 10:08 AM  New Problems: SLEEP APNEA (ICD-780.57)

## 2010-02-12 NOTE — Assessment & Plan Note (Signed)
Summary: R ARM AND HAND PAIN  X  2 WKS  STC   Vital Signs:  Patient profile:   65 year old male Height:      65 inches Weight:      179 pounds BMI:     29.89 O2 Sat:      96 % on Room air Temp:     97.0 degrees F oral Pulse rate:   78 / minute BP sitting:   100 / 66  (left arm) Cuff size:   regular  Vitals Entered By: Bill Salinas CMA (May 28, 2009 4:35 PM)  O2 Flow:  Room air CC: pt here with complaint of pain in his right arm x 2 weeks/ ab   Primary Care Provider:  Norins  CC:  pt here with complaint of pain in his right arm x 2 weeks/ ab.  History of Present Illness: Patient presents for pain in the hand and shoulder in the morning early. The pain is greatest in the forearm. He does have paresthesia in the right UE at times. Decreased grip strength. NO recent injury. He has been doing more physical activity but no chronic reptitive activity.   Current Medications (verified): 1)  Lisinopril 5 Mg Tabs (Lisinopril) .... Take One Tablet By Mouth Daily 2)  Simvastatin 80 Mg  Tabs (Simvastatin) .... 1/2 Tab By Mouth Once Daily 3)  Lunesta 3 Mg Tabs (Eszopiclone) .... Use As Directed As Needed 4)  Carvedilol 3.125 Mg Tabs (Carvedilol) .... Take One Tablet By Mouth Twice A Day 5)  Alprazolam 0.25 Mg  Tabs (Alprazolam) .... Take 1 Tablet By Mouth Three Times A Day As Needed 6)  Aspirin 81 Mg  Tabs (Aspirin) .... Take 1 Tablet By Mouth Once A Day 7)  Fluoxetine Hcl 40 Mg  Caps (Fluoxetine Hcl) .Marland Kitchen.. 1 By Mouth Once Daily  Allergies (verified): No Known Drug Allergies  Past History:  Past Surgical History: Last updated: 11/09/2008 Tonsillectomy   coronary artery bypass surgery I believe in 2001   Successful PTCA with stent placement in the distal right coronary artery reducing an 80% stenosis to 0% residual with TIMI-3 flow.PLAN:  Integrilin will be continued for 18 hours.  Plavix will be administered for four weeks.DD:  05/03/01TD:  05/07/01Job: [id number]:   Alric Quan CC:  Sonda Primes, M.D. [facility] Cecil Cranker, M.D. [facility] Catheterization Lab  Family History: Last updated: 07/06/2007 Coronary artery disease in his father. Vascular disease, periperal in his father. Mother had cirrhosis  Social History: The patient is currently working Hydrographic surveyor.  He reports that his marriage is problematic and they have been to counseling. Had to stop due to loss of insurance. He is a grandfather.   Review of Systems  The patient denies anorexia, weight loss, weight gain, chest pain, syncope, dyspnea on exertion, abdominal pain, muscle weakness, depression, and enlarged lymph nodes.    Physical Exam  General:  alert, well-developed, well-nourished, and well-hydrated.   Head:  normocephalic and atraumatic.   Eyes:  pupils equal, pupils round, corneas and lenses clear, and no injection.   Lungs:  normal respiratory effort and normal breath sounds.   Heart:  normal rate and regular rhythm.   Msk:  no deformity of UE, hands Neurologic:  alert & oriented X3 and cranial nerves II-XII intact.  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  Plan - advised to wear a cock-up wrist splint           if symptoms persist will need to move ahead with c-spine series.   Complete Medication List: 1)  Lisinopril 5 Mg Tabs (Lisinopril) .... Take one tablet by mouth daily 2)  Simvastatin 80 Mg Tabs (Simvastatin) .... 1/2 tab by mouth once daily 3)  Lunesta 3 Mg Tabs (Eszopiclone) .... Use as directed as needed 4)  Carvedilol 3.125 Mg Tabs (Carvedilol) .... Take one tablet by mouth twice a day 5)  Alprazolam 0.25 Mg Tabs (Alprazolam) .... Take 1 tablet by mouth three times a day as needed 6)  Aspirin 81 Mg Tabs (Aspirin) .... Take 1 tablet by mouth once a  day 7)  Fluoxetine Hcl 40 Mg Caps (Fluoxetine hcl) .Marland Kitchen.. 1 by mouth once daily

## 2010-02-12 NOTE — Progress Notes (Signed)
Summary: CPAP RX  Phone Note Call from Patient Call back at [phone]   Summary of Call: Patient called again to check on the status of CPAP prescription. Please see previous phone note. Initial call taken by: Lucious Groves CMA,  August 06, 2009 10:08 AM  Follow-up for Phone Call        Seem to recall writing a an order. But....does he know the settings for his CPAP? Is this on record with the providing company? Written order, without settings, on triage cart.  Follow-up by: Jacques Navy MD,  August 06, 2009 10:55 AM  Additional Follow-up for Phone Call Additional follow up Details #1::        left mess to call office back w/info regarding where order needs to go Additional Follow-up by: Lamar Sprinkles, CMA,  August 08, 2009 5:14 PM    Additional Follow-up for Phone Call Additional follow up Details #2::    left message to callback office to leave info on where order needs to go Brenton Grills MA  August 10, 2009 9:51 AM   Left vm. Pt wants cpap rx to go to [facility]. I will fax to pharm and then mail to patient. Follow-up by: Lamar Sprinkles, CMA,  August 10, 2009 11:45 AM

## 2010-02-12 NOTE — Medication Information (Signed)
Summary: PAP Supplies/[facility]  PAP Supplies/[facility]   Imported By: Sherian Rein 09/07/2009 15:18:57  _____________________________________________________________________  External Attachment:    Type:   Image     Comment:   External Document

## 2010-02-12 NOTE — Progress Notes (Signed)
  Phone Note Refill Request Message from:  Fax from Pharmacy on January 26, 2009 9:42 AM  Refills Requested: Medication #1:  LUNESTA 3 MG TABS use as directed as needed Please Advise refill? thanks  Initial call taken by: Ami Bullins CMA,  January 26, 2009 9:42 AM  Follow-up for Phone Call        ok for two months. Last OV to me July '09. Needs OV Follow-up by: Jacques Navy MD,  January 26, 2009 1:00 PM    Prescriptions: LUNESTA 3 MG TABS (ESZOPICLONE) use as directed as needed  #90 x 0   Entered by:   Ami Bullins CMA   Authorized by:   Jacques Navy MD   Signed by:   Bill Salinas CMA on 01/26/2009   Method used:   Telephoned to ...       [facility]* (retail)       [address]       [place], [place]  [id number]       Ph: [phone]       Fax: [phone]   RxID:   [id number]

## 2010-02-12 NOTE — Progress Notes (Signed)
Summary: NUC PRE-PROCEDURE  Phone Note Outgoing Call   Call placed by: Domenic Polite, CNMT,  November 06, 2009 4:15 PM Call placed to: Patient Reason for Call: Confirm/change Appt Summary of Call: Reviewed information on Myoview Information Sheet (see scanned document for further details).  Spoke with patient.  Initial call taken by: Domenic Polite, CNMT,  November 06, 2009 4:15 PM     Nuclear Med Background Indications for Stress Test: Evaluation for Ischemia, Graft Patency  Indications Comments: OSA  History: Angioplasty, CABG, Echo, Myocardial Perfusion Study, Stents  History Comments: '01 CABG, STENT-RCA, ECHO NML EF, MPI NO ISCH.   Symptoms: Chest Pain, Chest Pressure with Exertion, Fatigue, SOB    Nuclear Pre-Procedure Cardiac Risk Factors: Family History - CAD, Hypertension, Lipids Height (in): 66

## 2010-02-20 NOTE — Assessment & Plan Note (Signed)
Summary: pt requests consultation to discuss a few issues/#/cd   Vital Signs:  Patient profile:   65 year old male Height:      66 inches Weight:      185 pounds BMI:     29.97 O2 Sat:      96 % on Room air Temp:     97.9 degrees F oral Pulse rate:   97 / minute BP sitting:   120 / 76  (left arm) Cuff size:   regular  Vitals Entered By: Bill Salinas CMA (February 07, 2010 1:47 PM)  O2 Flow:  Room air CC: ov to discuss issue with MD/ ab   Primary Care Provider:  Jacques Navy MD  CC:  ov to discuss issue with MD/ ab.  History of Present Illness: Patient presents with the complaint of deep depression and restlessness. He recounts how he has lost two jobs in the last 12 months and is now unemployed; his marriage is in bad shape and he has moved out living in a small place approx. 1 hr from [place]. He feels restless, purposeless, frustrated and uncertain about his future. He is also financially stressed. He denies suicidal thoughts. He is sporadic about taking his medications.   Current Medications (verified): 1)  Lisinopril 5 Mg Tabs (Lisinopril) .... Take One Tablet By Mouth Daily 2)  Lunesta 3 Mg Tabs (Eszopiclone) .... Use As Directed As Needed 3)  Carvedilol 3.125 Mg Tabs (Carvedilol) .... Take One Tablet By Mouth Twice A Day 4)  Alprazolam 0.25 Mg  Tabs (Alprazolam) .... Take 1 Tablet By Mouth Three Times A Day As Needed 5)  Aspirin 81 Mg  Tabs (Aspirin) .... Take 1 Tablet By Mouth Once A Day 6)  Fluoxetine Hcl 40 Mg  Caps (Fluoxetine Hcl) .Marland Kitchen.. 1 By Mouth Once Daily 7)  Nitrostat 0.4 Mg Subl (Nitroglycerin) .Marland Kitchen.. 1 Tablet Under Tongue At Onset of Chest Pain; You May Repeat Every 5 Minutes For Up To 3 Doses. 8)  Triamterene-Hctz 37.5-25 Mg Tabs (Triamterene-Hctz) .Marland Kitchen.. 1 By Mouth Daily 9)  Pravastatin Sodium 80 Mg Tabs (Pravastatin Sodium) .... Take 1 Tablet Every Evening  Allergies (verified): No Known Drug Allergies  Past History:  Past Medical History: Last updated:  08/20/2009 Current Problems:  CORONARY ARTERY DISEASE (ICD-414.00) HYPERCHOLESTEROLEMIA (ICD-272.0) HYPERTENSION (ICD-401.9) HYPERLIPIDEMIA (ICD-272.4) DEPRESSION (ICD-311) Relative bradycardia may be contributing to fatigue Obstructive sleep apnea      - PSG 02/05/03 RDI 36 Cramps-Generalized  Past Surgical History: Last updated: 11/09/2008 Tonsillectomy   coronary artery bypass surgery I believe in 2001   Successful PTCA with stent placement in the distal right coronary artery reducing an 80% stenosis to 0% residual with TIMI-3 flow.PLAN:  Integrilin will be continued for 18 hours.  Plavix will be administered for four weeks.DD:  05/03/01TD:  05/07/01Job: [id number]:  Alric Quan CC:  Sonda Primes, M.D. [facility] Cecil Cranker, M.D. [facility] Catheterization Lab  Family History: Last updated: 07/06/2007 Coronary artery disease in his father. Vascular disease, periperal in his father. Mother had cirrhosis  Social History: The patient is currently unemplyed. He had been working Hydrographic surveyor.  He reports that his marriage is problematic and they have been to counseling. Currently he is seperated from his weife.  He is a grandfather.   Review of Systems  The patient denies anorexia, fever, weight loss, weight gain, chest pain, dyspnea on exertion, peripheral edema, headaches, abdominal pain, severe indigestion/heartburn, muscle weakness, transient blindness, difficulty walking, abnormal  bleeding, and angioedema.    Physical Exam  General:  WNWD well groomed white man Head:  normocephalic and atraumatic.   Eyes:  pupils equal and pupils round.  C&S clear Lungs:  normal respiratory effort.   Heart:  normal rate and regular rhythm.   Neurologic:  alert & oriented X3 and gait normal.   Skin:  turgor normal and color normal.   Psych:  Oriented X3, memory intact for recent and remote, normally interactive, good eye contact, and  moderately anxious.  Depressed.   Impression & Recommendations:  Problem # 1:  DEPRESSION (ICD-311) Primary issue today. He is not suicidal. He is willing to have help.  Plan Rx - effexor XR starting at 75mg  once daily        provided names of therapist and number for behavioral medicine. He has seen Dr. Dellia Cloud in the past but doesn't wish to return there.   His updated medication list for this problem includes:    Alprazolam 0.25 Mg Tabs (Alprazolam) .Marland Kitchen... Take 1 tablet by mouth three times a day as needed    Venlafaxine Hcl 75 Mg Xr24h-tab (Venlafaxine hcl) .Marland Kitchen... 1 by mouth once daily for depression.  Problem # 2:  HYPERCHOLESTEROLEMIA (ICD-272.0) Over due for follow-up lab.  His updated medication list for this problem includes:    Pravastatin Sodium 80 Mg Tabs (Pravastatin sodium) .Marland Kitchen... Take 1 tablet every evening  Problem # 3:  HYPERTENSION (ICD-401.9)  His updated medication list for this problem includes:    Lisinopril 5 Mg Tabs (Lisinopril) .Marland Kitchen... Take one tablet by mouth daily    Carvedilol 3.125 Mg Tabs (Carvedilol) .Marland Kitchen... Take one tablet by mouth twice a day    Triamterene-hctz 37.5-25 Mg Tabs (Triamterene-hctz) .Marland Kitchen... 1 by mouth daily  BP today: 120/76 Prior BP: 100/62 (10/29/2009)  BP is well controlled.  Complete Medication List: 1)  Lisinopril 5 Mg Tabs (Lisinopril) .... Take one tablet by mouth daily 2)  Lunesta 3 Mg Tabs (Eszopiclone) .... Use as directed as needed 3)  Carvedilol 3.125 Mg Tabs (Carvedilol) .... Take one tablet by mouth twice a day 4)  Alprazolam 0.25 Mg Tabs (Alprazolam) .... Take 1 tablet by mouth three times a day as needed 5)  Aspirin 81 Mg Tabs (Aspirin) .... Take 1 tablet by mouth once a day 6)  Venlafaxine Hcl 75 Mg Xr24h-tab (Venlafaxine hcl) .Marland Kitchen.. 1 by mouth once daily for depression. 7)  Nitrostat 0.4 Mg Subl (Nitroglycerin) .Marland Kitchen.. 1 tablet under tongue at onset of chest pain; you may repeat every 5 minutes for up to 3 doses. 8)   Triamterene-hctz 37.5-25 Mg Tabs (Triamterene-hctz) .Marland Kitchen.. 1 by mouth daily 9)  Pravastatin Sodium 80 Mg Tabs (Pravastatin sodium) .... Take 1 tablet every evening Prescriptions: VENLAFAXINE HCL 75 MG XR24H-TAB (VENLAFAXINE HCL) 1 by mouth once daily for depression.  #30 x 2   Entered and Authorized by:   Jacques Navy MD   Signed by:   Bill Salinas CMA on 02/08/2010   Method used:   Electronically to        [facility]* (retail)       [address]       [place], [place]  [id number]       Ph: [phone]       Fax: [phone]   RxID:   [id number]

## 2010-05-05 ENCOUNTER — Other Ambulatory Visit: Payer: Self-pay | Admitting: Internal Medicine

## 2010-05-28 NOTE — Assessment & Plan Note (Signed)
[facility]                            CARDIOLOGY OFFICE NOTE   NAME:Roy [name]                   MRN:          [id number]  DATE:11/30/2007                            DOB:          1945-04-19    [name] seen today in followup.  He is a previous patient of Dr.  Diona Browner.  He has had coronary artery bypass surgery I believe in 2001.  He had a Myoview study done in November which was nonischemic.  He is  not having any significant chest pain.  His risk factors well modified.  He is about to lose his [place] insurance and wanted to make sure that no  other testing needed to be done.  I told him I thought that his heart  was stable.  He needs a refill on his prescriptions through [facility].  Last  time I saw him, he was relatively hypotensive and bradycardic.  We  switched him over to lower dose ACE inhibitor and beta blockers.  He has  had a nice response to this.  Unfortunately, he continues to be a bit  depressed about his unemployment.   He is otherwise doing well.   REVIEW OF SYSTEMS:  Negative.  In particular, he is not having any  significant chest pain, PND, or orthopnea.  No palpitations.   MEDICATIONS:  1. Aspirin a day.  2. Lisinopril 5 a day.  3. Coreg 3.125 b.i.d.  4. Simvastatin 40 a day.  5. Centrum.  6. Fluoxetine 40 a day.  7. P.r.n. Xanax.   PHYSICAL EXAMINATION:  GENERAL:  Remarkable for a well-kept elderly male  in no distress.  VITAL SIGNS:  His blood pressure is 120/70, pulse is 68 and regular,  respiratory rate 14, afebrile.  HEENT:  Unremarkable.  NECK:  Carotids are normal without bruit.  No lymphadenopathy,  thyromegaly, or JVP elevation.  LUNGS:  Clear.  Good diaphragmatic motion.  No wheezing.  CARDIAC:  S1, S2 with normal heart sounds.  PMI normal.  ABDOMEN:  Benign.  Bowel sounds positive.  No AAA.  No tenderness.  No  bruit.  No hepatosplenomegaly or hepatojugular reflux.  No tenderness.  EXTREMITIES:  Distal  pulses are intact.  No edema.  NEUROLOGICAL:  Nonfocal.  SKIN:  Warm and dry.  MUSCULOSKELETAL:  No muscular weakness.   IMPRESSION:  1. Stable coronary artery disease, previous coronary artery bypass      graft.  Continue aspirin and beta-blocker.  Nonischemic Myoview      earlier this month.  2. Hypercholesterolemia in the setting of coronary artery disease.      Continue simvastatin.  Lipid and liver profile in 6 months.  3. Depression secondary to job loss.  Continue fluoxetine.  Follow up      with primary care MD.  4. Hypertension, currently well controlled.  Continue low-dose      lisinopril.  Overall, I think Roy [name] is doing fine.  I will see      him back in a year.     Noralyn Pick. Eden Emms, MD, [facility]  Electronically Signed    PCN/MedQ  DD: 11/30/2007  DT: 11/30/2007  Job #: [id number]

## 2010-05-28 NOTE — Assessment & Plan Note (Signed)
[facility]                            CARDIOLOGY OFFICE NOTE   NAME:[name], Roy COSTANZO                   MRN:          [id number]  DATE:11/05/2007                            DOB:          1945/12/07    Roy [name] turns today for followup.  He is a new patient to me.  He  has previously seen by Dr. Diona Browner.  He has a distant history of  coronary artery bypass surgery with a stent to the right coronary artery  in 2001.  His last Myoview was done in September 2008, which showed a  question of an inferior wall infarct but no ischemia.  EF 60% was deemed  low-risk.  The patient has been having a little bit of exertional  dyspnea and chest pain.  The chest pain is not typical of angina.  It is  central in his chest.  It can radiate to the shoulders.  There is no  associated diaphoresis, I suspect the biggest issue is that he lost his  job.  He worked for [employer], a Hospital doctor.  He had been  there for 4 years, but in the industry for 38 years.  He was let go a  few months ago.  He has been severely pressed about this.  He has been  placed on fluoxetine 40 mg a day and has Xanax.   He also complains of significant fatigue.  He does not seem to have a  lot of energy.  He occasionally gets dizziness.  Sometimes he thinks  when he turns to the right, his eyes do not track.  He almost appears to  be describing nystagmus.   ALLERGIES:  He has no known allergies.   MEDICATIONS:  1. Aspirin a day.  2. Altace 10 a day.  3. Metoprolol 50 a day.  4. Simvastatin 40 a day.  5. Centrum.  6. Fluoxetine 40 a day.  7. P.r.n. Xanax.   PHYSICAL EXAMINATION:  GENERAL:  Exam is remarkable for a pleasant  middle-aged white male, in no distress.  VITAL SIGNS:  Blood pressure is 108/73, is not postural, pulse is only  48-51, respiratory rate is 14, afebrile.  Weight is 164.  HEENT:  Unremarkable.  NECK:  Carotids are normal without bruit.  No  lymphadenopathy,  thyromegaly, or JVP elevation.  LUNGS:  Clear with good diaphragmatic motion.  No wheezing.  HEART:  S1 and S2.  Normal heart sounds.  PMI normal.  ABDOMEN:  Benign.  Bowel sounds positive.  No AAA.  No tenderness.  No  bruit.  No hepatosplenomegaly or hepatojugular reflux.  No tenderness.  EXTREMITIES:  Distal pulses are intact.  No edema.  NEURO:  Nonfocal.  SKIN:  Warm and dry.  MUSCULOSKELETAL:  No muscular weakness.   EKG shows sinus bradycardia, is otherwise normal.   IMPRESSION:  1. Chest pain, history of coronary artery disease with stent to the      right coronary artery in 2001.  Follow up stress Myoview.  2. Relative bradycardia may be contributing to fatigue, decrease long-      acting  metoprolol 50 a day changed to Coreg 3.125 b.i.d.  I will      further assess and it may be that he needs to be off beta-blockers      altogether.  I will see him back in 4 weeks to see if his heart      rate is running in the 60s.  We will also see what his heart rate      responses to exercise during his treadmill.  3. Relatively low blood pressure in the setting of fatigue.  We will      change his Altace from 10 mg to lisinopril 5 mg.  Again, I get the      feeling that he is being overmedicated slightly in the addition of      Prozac and Xanax is probably not helping.   Further recommendations will be based on the results of his stress test  in response to adjusting his medicines.     Noralyn Pick. Eden Emms, MD, [facility]  Electronically Signed    PCN/MedQ  DD: 11/05/2007  DT: 11/05/2007  Job #: [id number]

## 2010-05-28 NOTE — Assessment & Plan Note (Signed)
[facility]                            CARDIOLOGY OFFICE NOTE   NAME:Graef, MARCO RAPER                   MRN:          [id number]  DATE:09/22/2006                            DOB:          1945/05/04    PRIMARY CARE PHYSICIAN:  Rosalyn Gess. Norins, M.D.   REASON FOR VISIT:  Cardiology followup.   HISTORY OF PRESENT ILLNESS:  Mr. Yusko comes in for a routine visit.  He is a former patient of Dr. Corinda Gubler.  Cardiac history includes  previous stent placement to the right coronary artery, back in May of  2001.  He has done fairly well over the years, in terms of no major  recurrent symptomatology.  His last Myoview was in 2004, demonstrating a  moderate to large inferior defect, which was nonreversible, suggesting  potential scar, although ejection fraction was 57% at that time.   Since his last visit, he quit smoking, back in April.  He states that,  generally, he has been more tired and felt bloated, although has also  gained weight, at least ten pounds, since this time.  He is not  exercising regularly, except that he walks his dog at a slow pace most  days of the week.  He is not experiencing any angina or limiting dyspnea  on exertion.  His medications are outlined below.   Today, his electrocardiogram shows normal sinus rhythm at 74 beats per  minute.   ALLERGIES:  No known drug allergies.   MEDICATIONS:  1. Enteric coated aspirin 81 mg p.o. daily.  2. Altace 10 mg p.o. daily.  3. Toprol XL 50 mg p.o. daily.  4. Zocor 40 mg p.o. daily.  5. He has Xanax and Lunesta p.r.n.   REVIEW OF SYSTEMS:  As described in History of Present Illness,  otherwise negative.   PHYSICAL EXAMINATION:  Blood pressure 112/80, heart rate 74, weight 181  pounds, up from 167 at his last visit.  Patient is comfortable, in no acute distress, tanned.  HEENT:  Normal.  NECK:  Supple, no elevated jugular venous pressure, no loud bruits, no  thyromegaly is noted.  LUNGS:  Clear without labored breathing.  CARDIAC EXAM:  Reveals a regular rate and rhythm, no loud murmur or  gallop, no pericardial rub.  ABDOMEN:  Soft, no bruits.  EXTREMITIES:  Exhibit no pitting edema.  Distal pulses 2+.  SKIN:  Warm and dry.  MUSCULOSKELETAL:  No kyphosis noted.  NEUROPSYCHIATRIC:  Patient is alert and oriented times three.  Affect is  normal.   IMPRESSION/RECOMMENDATIONS:  1. History of single-vessel coronary artery disease, status post      remote intervention in 2001.  Last ischemic evaluation was in 2004.      We will plan a followup adenosine Myoview with low-level ambulation      on medical therapy for reassessment of underlying ischemic burden      and cardiac risk stratification.  I suspect that his general      fatigue may be more related to inactivity and weight-gain,      following his smoking cessation, rather than progressive ischemia.  I spoke with him about increasing his walking regimen, assuming his      Myoview is low risk, and otherwise diet.  We will plan annual      followup, assuming he does not develop any new symptoms.  He will      otherwise continue regular followup with Dr. Debby Bud, who has been      managing his cholesterol.  Goal LDL would be around 70.  2. Further plans to follow.     Jonelle Sidle, MD  Electronically Signed    SGM/MedQ  DD: 09/22/2006  DT: 09/23/2006  Job #: [id number]   cc:   Rosalyn Gess. Norins, MD

## 2010-05-31 NOTE — Cardiovascular Report (Signed)
[place]  Patient:    Roy [name], Roy [name]                   MRN: [id number] Proc. Date: 05/16/99 Adm. Date:  [id number] Disc. Date: [id number] Attending:  Alric Quan CC:         Sonda Primes, M.D. [facility]             E. Graceann Congress, M.D. [facility]             Catheterization Lab                        Cardiac Catheterization  PROCEDURE PERFORMED: 1. Left heart catheterization with coronary angiography and left    ventriculography. 2. Percutaneous transluminal coronary angioplasty with stent placement in the    distal right coronary artery.  INDICATION:  Mr. Antkowiak is a 65 year old male who presented with recent onset of unstable angina.  CATHETERIZATION PROCEDURAL NOTE:  A 6 French sheath was placed in the right femoral artery.  Standard Judkins 6 French catheters were utilized.  Contrast was Omnipaque.  There were no complications.  CATHETERIZATION RESULTS:  Hemodynamics:  Left ventricular pressure 134/16.  Aortic pressure 134/84. No aortic valve gradient.  Left ventriculogram:  Wall motion is normal.  Ejection fraction calculated at 58%.  There is trace mitral regurgitation.  Coronary arteriography (right dominant): 1. Left main is absent.  The left anterior descending artery and left    circumflex arise from separate ostia. 2. LAD has a 25% stenosis in the proximal vessel and a 25% stenosis in the mid    vessel.  The LAD gives rise to two normal-sized diagonal branches. 3. Left circumflex is a relatively small-caliber vessel.  It gives rise to a    normal-sized OM #1 and a small OM #2.  It is free of angiographic disease. 4. Right coronary artery is a very tortuous vessel.  In the proximal right    coronary artery is a 25% stenosis.  In the mid vessel is a 20%.  In the    distal vessel is an 80% stenosis with haze and the appearance of a ruptured    plaque.  The right coronary artery gives rise to a normal-sized posterior  descending artery, a normal-sized first posterolateral branch, small second    posterolateral, and a normal third posterolateral.  IMPRESSIONS: 1. Normal left ventricular systolic function. 2. One-vessel coronary artery disease as described with an 80% stenosis in the    distal right coronary artery which has the appearance of a ruptured plaque.  PLAN:  Percutaneous intervention of the distal right coronary artery.  See below.  PTCA PROCEDURAL NOTE:  Following completion of the diagnostic catheterization, we proceed with percutaneous intervention of the right coronary artery.  The patient was enrolled in the Cruise trial and randomized to receive heparin and Integrilin.  We used a 7 [place] JR-4 guiding catheter with side holes and a BMW wire.  Pre-dilatation of the lesion was performed with a 3.0 x 15 mm Quantum Ranger balloon inflated to 10 atmospheres.  We then placed a 3.5 x 13 mm BX Velocity stent.  The stent was initially deployed at 10 atmospheres. We subsequently performed inflations with the stent delivery balloon to 15 atmospheres and then to 20 atmospheres for a final diameter of 3.9 mm.  Final angiographic images reveal an excellent result with 0% residual stenosis and TIMI-3 flow.  COMPLICATIONS:  None.  RESULTS:  Successful PTCA with stent placement in the distal right coronary artery reducing an 80% stenosis to 0% residual with TIMI-3 flow.  PLAN:  Integrilin will be continued for 18 hours.  Plavix will be administered for four weeks. DD:  05/16/99 TD:  05/20/99 Job: [id number]

## 2010-05-31 NOTE — Assessment & Plan Note (Signed)
[facility]                              CARDIOLOGY OFFICE NOTE   NAME:Bruna, DECOREY WAHLERT                   MRN:          [id number]  DATE:                                      DOB:          [birth year]    ADDENDUM:  His EKG was within normal limits.   Because his rate is a little fast, we plan to increase the metoprolol to 75  mg b.i.d.                              Cecil Cranker, MD, [facility]    EJL/MedQ  DD:  08/13/2005  DT:  08/13/2005  Job #:  [id number]

## 2010-05-31 NOTE — Assessment & Plan Note (Signed)
[facility]                           PRIMARY CARE OFFICE NOTE   NAME:Roy [name], Roy [name]                   MRN:          [id number]  DATE:12/15/2005                            DOB:          [birth year]    Roy [name] is a 65 year old Caucasian gentleman who is followed for  hyperlipidemia, hypertension, and a history of depression.  He was last  seen by cardiology on August 13, 2005.  Please see that dictated note by  Dr. Corinda Gubler.  The patient was stable at that time.  The patient was last  seen in primary care by Dr. Jonny Ruiz on August 07, 2005 when he presented with  fever of unknown origin.  His laboratory work at that time included  blood cultures that were negative x2, a CBC with a white count of 4200.  Urinalysis was unremarkable with no sign of bacteria.  Chest x-ray at  that time was stable with no active cardiac or pulmonary process.  The  patient had a prostate exam done at that visit which was nontender and  unremarkable.  The patient has made a good recovery since that time.   INTERVAL HISTORY:  The patient was to increase his Toprol-XL to 75 mg  daily, but this made him too tired, and he actually resumed 50 mg daily.  The patient has had followup laboratory with Dr. Glennon Hamilton which  revealed that his cholesterol was 121 with triglycerides of 191.  HDL  was 25.6 and LDL was 57.  The patient has had mildly elevated serum  glucoses, a fasting value of 118.   The patient reports he does have some difficulty initiating his stream.  It feels like his bladder is not emptying completely.   PAST MEDICAL HISTORY:   SURGERIES:  Tonsillectomy.   MEDICAL:  1. Usual childhood diseases.  2. Hyperlipidemia.  3. Hypertension.  4. Coronary artery disease status post PCI and stent placement in the      RCA in 2001.  5. History of tobacco abuse in the past.  6. Depression.   CURRENT MEDICATIONS:  1. Aspirin 81 mg daily.  2. Altace 10 mg daily.  3.  Toprol-XL 50 mg daily.  4. Lipitor 40 mg daily.  5. Wellbutrin XL 300 mg daily.   FAMILY HISTORY:  Coronary artery disease in his father.  Vascular  disease, peripheral, in his father.  Insulin-dependent diabetes in his  father.  Mother had _________ cirrhosis.   SOCIAL HISTORY:  The patient is in sales of dry cleaning equipment.  He  reports his marriage is stable, and he is moderately happy.   REVIEW OF SYSTEMS:  Negative for any constitutional, cardiovascular,  respiratory or GI complaints at this time.   PHYSICAL EXAMINATION:  Physical with the assistance of Stevphen Meuse,  NP student.  VITAL SIGNS:  Temperature was 98.1, blood pressure 109/78, pulse 81,  weight 176.  GENERAL:  This is a well-nourished, well-developed, Caucasian male in no  acute distress.  HEENT:  Normocephalic and atraumatic.  Right EAC clear with normal TM.  Left EAC with a cerumen impaction which was irrigated without  difficulty.  Oropharynx with native dentition in good repair.  No buccal  or palatal lesions were noted.  Posterior pharynx was clear.  Conjunctivae and sclerae were clear.  Pupils equal, round and reactive  to light and accommodation.  Extraocular movements intact.  Funduscopic  exam was unremarkable.  NECK:  Supple without thyromegaly.  NODES:  No adenopathy was noted in the cervical or supraclavicular  regions.  CHEST:  No CVA tenderness.  LUNGS:  Clear to auscultation and percussion.  CARDIOVASCULAR:  There were 2+ radial pulses.  No JVD or carotid bruits.  He had a quite precordium with regular rate and rhythm without murmurs,  rubs, or gallops.  ABDOMEN:  Soft.  No guarding, no rebound.  No organomegaly was noted.  RECTAL EXAM:  Normal sphincter tone was noted.  Prostate was smooth with  normal size and contour but generous.  No nodules were appreciated.  Stool was negative.  EXTREMITIES:  Without clubbing, cyanosis, edema or deformity.  NEUROLOGIC:  Nonfocal.  DERMATOLOGIC:   The patient has keratotic lesions in the left axillary  line.   DATABASE:  Labs from December 08, 2005 with hemoglobin of 15.7 gm.  White count was 8200 with a normal differential.  Chemistries were  unremarkable, except for minimally elevated potassium at 5.5.  Glucose  elevated at 118.  Liver functions were normal.  Kidney function was  normal with a creatinine of 1.1, GFR of 73.  TSH normal at 1.89.  PSA  was normal at 0.51.  Urinalysis was negative.  Cholesterol was 165,  triglycerides 130, HDL 48.4, LDL was 91.   ASSESSMENT AND PLAN:  1. Hypertension, well controlled.  The patient will continue his      present medications, even on a reduced of Toprol.  2. Cardiovascular.  The patient is stable.  Will follow on a regular      basis by Dr. Glennon Hamilton.  The patient's last stress/Cardiolite      study was in September of 2004 and was unremarkable.  3. Hyperlipidemia.  The patient is well controlled.  He will continue      on Lipitor.  4. Hyperglycemia.  The patient does have an elevated serum glucose of      118.  I have encouraged the patient to follow a low carbonate no      sweets diet.  5. Genitourinary.  Question if the patient has benign prostatic      hypertrophy.  Have given him a slower screen.  He is not having      nocturia.  He is not having urinary frequency or disruption in his      sleep pattern.  Would not start medications at this time.  6. Hyperkalemia.  The patient with borderline elevated potassium.  He      takes no exogenous potassium.  None of his medications would foster      a rise in potassium, except possibly Altace.  The patient's      creatinine was normal.  Plan - The patient to have followup      laboratory in 6 months.  7. Psychosocial.  The patient reports he is stable, doing well.  His      mood and affect seem normal.  We discussed whether Wellbutrin      should be continued at this point, given that he is doing well.  He     will continue  Wellbutrin XL 300 mg daily, as well as Xanax 0.25 mg  t.i.d. on a p.r.n. basis.  The patient also will continue on      Lunesta 3 mg q.h.s. p.r.n. for sleep.  8. Health maintenance.  The patient last had a flexible sigmoidoscopy      in 1997.  He is a candidate for colonoscopy.  We attempted to      schedule in 2005, but will re-attempt to schedule at this time for      routine colorectal cancer screening.   In summary, this is a pleasant gentleman with problems as outlined  above.  He does seem medically stable at this time.  He will return to  see me on a p.r.n. basis or in 1 year.  He will return in 6 months for  followup laboratory.     Rosalyn Gess Norins, MD  Electronically Signed    MEN/MedQ  DD: 12/15/2005  DT: 12/16/2005  Job #: [id number]   cc:   Lyndel Safe, MD, [facility]

## 2010-05-31 NOTE — Discharge Summary (Signed)
[place]  Patient:    Roy [name], Roy [name]                   MRN: [id number] Adm. Date:  [id number] Disc. Date: [id number] Attending:  Alric Quan Dictator:   Leonides Cave, P.A. CC:         Rosalyn Gess. Norins, M.D. [facility]                  Referring Physician Discharge [place]  DATE OF BIRTH:  1945-12-13.  DISCHARGE DIAGNOSES: 1. Coronary artery disease status post percutaneous coronary intervention on    the right coronary artery reduced in a distal 80% ruptured plaque to 0%. 2. Patient enrolled in ______ study. 3. Multiple risk factors for coronary artery disease including tobacco abuse,    family history and hyperlipidemia.  HISTORY OF PRESENT ILLNESS:  The patient is a pleasant 65 year old white married male with recurrent chest pain radiating to both arms.  Initially, his symptoms presented as angina decubitus occurring while he lied on his back. Dr. Posey Rea had ordered a Cardiolite, and this was done on May 09, 1999 which revealed poor exercise tolerance and was electronically negative for ischemia. Ejection fraction was normal at 61%.  The patient, however, continued to have symptoms and was seen by Dr. Graceann Congress on May 16, 1999, and Dr. Corinda Gubler felt that aggressive care should be taken; so, therefore, he was brought over to [facility] on the same day to the catheterization lab.  HOSPITAL COURSE:  Dr. Loraine Leriche Pulsipher performed a cardiac catheterization on the patient finding the left main absent of coronary disease, the LAD with a proximal 25% lesion, a mid 25% lesion, the left circumflex was small and free of disease, the RCA had a proximal 25% lesion and mid 20% lesion and a distal 80% lesion with haze and ruptured plaque seen.  Dr. Gerri Spore proceeded to do a percutaneous intervention and stenting of the distal RCA reducing the 80% lesion to 0% residually.  The patient was enrolled into the ______ study and was  continued on Integrilin post procedure.  The patient was discharged home on the following day, May 17, 1999, in stable condition the following medications.  DISCHARGE MEDICATIONS: 1. Plavix 75 mg for four weeks. 2. Toprol XL 50 mg q.d. 3. Altace 2.5 mg q.d. 4. Nortriptyline as taken before. 5. Coated aspirin 325 mg q.d. 6. Nitroglycerin 0.4 mg sublingually p.r.n. for chest pain.  DISCHARGE INSTRUCTIONS:  The patient was instructed to undergo no heavy lifting, driving or sexual activity for two days.  He was told to adhere to a low-fat, low-cholesterol diet.  He was told to stop smoking.  He was told that if bleeding or swelling occurred at the catheterization site he should call the [facility] cardiology office immediately.  FOLLOW-UP:  The patient was asked to follow up with Dr. Debby Bud in two to three weeks and to follow up with Dr. Graceann Congress on June 30 at 3 p.m. DD:  05/17/99 TD:  05/17/99 Job: [id number]

## 2010-05-31 NOTE — Assessment & Plan Note (Signed)
[facility]                              CARDIOLOGY OFFICE NOTE   NAME:Roy [name]                   MRN:          [id number]  DATE:08/13/2005                            DOB:          02-26-1945    A very pleasant 65 year old, white, married male, with coronary artery  disease, multiple risk factors, underwent percutaneous intervention with  stenting of the right coronary artery in February 2001.  He has had no  recurrent symptoms.  Lipids have been well controlled.  He is having no  cardiac symptoms, working full time and feeling well, unfortunately  continues to smoke and has no desire to stop.   MEDICATIONS:  1.  Aspirin 81.  2.  Altace 10.  3.  Toprol XL 50.  4.  Lipitor 40.  5.  Wellbutrin.  6.  Toprol XL 50.   Recently, the patient had a febrile illness.  He was seen by Dr. Jonny [name], had  some lab work revealing 4200 white count, hemoglobin 16.2, platelet count  98,000.  There were 15 monocytes which is slightly increased.  The  urinalysis revealed trace ketones, small amount of blood and bilirubin and  1+ hyaline casts.   PHYSICAL EXAMINATION:  VITAL SIGNS:  Blood pressure 122/72, pulse 97.  In  the past his pulse has been in the 60s.  He has been taking his beta-  blocker.  GENERAL APPEARANCE:  Normal.  NECK:  JVP is not elevated.  Carotid pulses palpable without bruits.  LUNGS:  Clear.  CARDIAC:  Normal.  ABDOMEN:  Unremarkable.  EXTREMITIES:  Normal.   IMPRESSION:  1.  Coronary artery disease with prior percutaneous intervention with      stenting of the circumflex February 15, 1999, asymptomatic.  2.  Hyperlipidemia on therapy.  3.  Cigarette abuse.  4.  Hypertension.  5.  Recent febrile illness, undetermined cause.  He had low platelet count,      slightly increased monocytes, and hyaline casts, and small amount of      blood and trace ketones, small amount of bilirubin in his urinalysis.   I have suggested a lipid  profile, LFTs, BMP, and repeat urinalysis and  platelet count.  I will see him back in 6 months or p.r.n.   ADDENDUM:  His EKG was within normal limits.   Because his rate is a little fast, we plan to increase the metoprolol to 75  mg b.i.d.                               Roy Cranker, MD, [facility]   Job# [id number]  EJL/MedQ  DD:  08/13/2005  DT:  08/13/2005  Job #:  [id number]

## 2010-09-06 ENCOUNTER — Other Ambulatory Visit: Payer: Self-pay | Admitting: Internal Medicine

## 2010-10-31 ENCOUNTER — Other Ambulatory Visit: Payer: Self-pay | Admitting: *Deleted

## 2010-10-31 MED ORDER — CARVEDILOL 3.125 MG PO TABS: 3.1250 mg | ORAL_TABLET | Freq: Two times a day (BID) | ORAL | Status: DC

## 2010-10-31 MED ORDER — LISINOPRIL 5 MG PO TABS: 5.0000 mg | ORAL_TABLET | Freq: Every day | ORAL | Status: DC

## 2010-11-13 ENCOUNTER — Other Ambulatory Visit: Payer: Self-pay | Admitting: *Deleted

## 2010-11-13 MED ORDER — PRAVASTATIN SODIUM 80 MG PO TABS: 80.0000 mg | ORAL_TABLET | Freq: Every day | ORAL | Status: DC

## 2011-03-18 ENCOUNTER — Telehealth: Payer: Self-pay | Admitting: *Deleted

## 2011-03-18 NOTE — Telephone Encounter (Signed)
REQUEST 90 DAY SUPPLY

## 2011-03-18 NOTE — Telephone Encounter (Signed)
REQUEST RX ON LUNESTA 3MG  AND EFFEXOR XR 75 MG 24 hr capsule, LAST OV 02/07/10 OK TO REFILL?

## 2011-03-19 MED ORDER — ESZOPICLONE 3 MG PO TABS: 3.0000 mg | ORAL_TABLET | Freq: Every day | ORAL | Status: DC

## 2011-03-19 MED ORDER — VENLAFAXINE HCL ER 75 MG PO CP24: ORAL_CAPSULE | ORAL | Status: DC

## 2011-03-19 NOTE — Telephone Encounter (Signed)
Done

## 2011-03-19 NOTE — Telephone Encounter (Signed)
Ok for 90 day supply 3 refills

## 2011-03-21 ENCOUNTER — Other Ambulatory Visit: Payer: Self-pay | Admitting: *Deleted

## 2011-03-21 MED ORDER — ESZOPICLONE 3 MG PO TABS: 3.0000 mg | ORAL_TABLET | Freq: Every day | ORAL | Status: DC

## 2011-03-21 MED ORDER — VENLAFAXINE HCL ER 75 MG PO CP24: ORAL_CAPSULE | ORAL | Status: DC

## 2011-03-21 NOTE — Progress Notes (Signed)
Patient request Rx[s] previously filled to different pharmacy.

## 2011-06-20 ENCOUNTER — Telehealth: Payer: Self-pay | Admitting: Cardiovascular Disease

## 2011-06-20 NOTE — Telephone Encounter (Signed)
New problem:  Patient calling . C/O sore throat at night for about month.  lightheadedness in am. Patient states something doesn't feel right.

## 2011-06-20 NOTE — Telephone Encounter (Signed)
Patient called stated he just don't feel right.States he is tired,indigestion, has had pain in jaws and also when wakes up in mornings has noticed lips tingling.No chest pain. Patient also states he has had a sore throat for 1 month.Advised to see PCP about sore throat.Patient wants appointment with Dr.Nishan.Patient was told Dr.Nishan's schedule is full will send message to Dr.Nishans nurse for appointment to be scheduled.

## 2011-06-23 NOTE — Telephone Encounter (Signed)
SPOKE WITH PT , COMPLAINS OF  FATIGUE  MAINLY,  ALONG  WITH THE OTHER NIGHT HAD WHAT THOUGHT WAS INDIGESTION AND JAW PAIN ,ROLAIDS TAKEN WITH RELIEF.  ALSO NOTES PAIN DIFFERS FROM WHEN HAD STENTS DONE APPT  MADE WITH LORI GERHARDT  NP FOR 06-27-11 AT 1:30 PM  INSTRUCTED IF S/S WORSEN PRIOR TO APPT TO GO TO ER FOR EVAL AND TX. PT VERBALIZED UNDERSTANDING./CY

## 2011-06-27 ENCOUNTER — Ambulatory Visit (INDEPENDENT_AMBULATORY_CARE_PROVIDER_SITE_OTHER): Payer: Medicare Other | Admitting: Nurse Practitioner

## 2011-06-27 ENCOUNTER — Encounter: Payer: Self-pay | Admitting: Nurse Practitioner

## 2011-06-27 VITALS — BP 112/80 | HR 92 | Ht 66.0 in | Wt 178.8 lb

## 2011-06-27 DIAGNOSIS — R079 Chest pain, unspecified: Secondary | ICD-10-CM

## 2011-06-27 DIAGNOSIS — E785 Hyperlipidemia, unspecified: Secondary | ICD-10-CM

## 2011-06-27 DIAGNOSIS — I10 Essential (primary) hypertension: Secondary | ICD-10-CM

## 2011-06-27 MED ORDER — OMEPRAZOLE 20 MG PO CPDR: 20.0000 mg | DELAYED_RELEASE_CAPSULE | Freq: Every day | ORAL | Status: DC

## 2011-06-27 MED ORDER — OLMESARTAN MEDOXOMIL 20 MG PO TABS: 20.0000 mg | ORAL_TABLET | Freq: Every day | ORAL | Status: DC

## 2011-06-27 NOTE — Assessment & Plan Note (Signed)
Patient presents with atypical chest pain, some similar characteristics like his prior angina but more like GI. He also has a cough and is on ACE. I have stopped the ACE. Gave him samples of Benicar 20 mg to try for 2 weeks. If the cough resolves, will change to generic losartan. If the cough does not resolve, will go back to the ACE. Will also add Omeprazole 20 mg a day. Will arrange for stress Myoview testing. Check fasting labs that day as well. Further disposition to follow. He does have NTG on hand. Patient is agreeable to this plan and will call if any problems develop in the interim.

## 2011-06-27 NOTE — Patient Instructions (Signed)
Stop the Lisinopril and try the Benicar 20 mg in its place - one each day. This may help with your cough  I have sent a prescription to the drug store for Omeprazole 20 mg daily for your stomach  We are going to arrange for a stress test  We will check fasting labs at the time of your stress test.  Call the [facility] office at [phone] if you have any questions, problems or concerns.

## 2011-06-27 NOTE — Progress Notes (Signed)
Roy [name] Date of Birth: 10-16-1945 Medical Record #[id number]  History of Present Illness: Roy [name] is seen today for a work in visit. He is seen for Dr. Eden Emms. He has known CAD with remote stent to the RCA in 2001, and last stress test in 2012. Other problems include HTN, HLD, OSA with refusal to use CPAP and depression. He denies having CABG.   He comes in today. He is here alone. He had called earlier this week to complain of fatigue mainly. He has had some indigestion the other night about 2 weeks ago and noted some jaw pain. Took Rolaids with relief. Says this is different from his symptoms when he had his stent placed but later during the visit says it may have some similarities, except he has no radiation to his arms. Has had a few recurrent episodes and uses the Rolaids with relief. No NTG use reported and he does have it available but did not feel he needed. He is able to push mow his yard without issue but was winded 2 weeks ago, but not last week when he mowed. Feels like he has a clump in his throat. Some cough that may be dry and hacky. Wonders if that is due to his medicines. No sputum production. Not really belching or burping. Diet is not ideal. He stays active and enjoys being outdoor. He has had no recent labs. He is tolerating his medicines. Takes them all at one time. Only using the Coreg once a day.   Current Outpatient Prescriptions on File Prior to Visit  Medication Sig Dispense Refill  . Eszopiclone (ESZOPICLONE) 3 MG TABS Take 1 tablet (3 mg total) by mouth at bedtime. Take immediately before bedtime  90 tablet  3  . lisinopril (PRINIVIL,ZESTRIL) 5 MG tablet Take 1 tablet (5 mg total) by mouth daily.  90 tablet  12  . pravastatin (PRAVACHOL) 80 MG tablet Take 1 tablet (80 mg total) by mouth daily.  90 tablet  3  . venlafaxine (EFFEXOR XR) 75 MG 24 hr capsule TAKE 1 CAPSULE ONCE DAILY FOR DEPRESSION.  90 capsule  3  . DISCONTD: carvedilol (COREG) 3.125 MG  tablet Take 1 tablet (3.125 mg total) by mouth 2 (two) times daily with a meal.  180 tablet  3    No Known Allergies  Past Medical History  Diagnosis Date  . CAD (coronary artery disease)     stent to the RCA in 2001. Negative Myoview in January 2012  . HTN (hypertension)   . HLD (hyperlipidemia)   . Depression   . Vertigo   . OSA (obstructive sleep apnea)     Past Surgical History  Procedure Date  . Coronary stent placement 2001     RCA  . Tonsillectomy     History  Smoking status  . Former Smoker  . Quit date: 07/03/2010  Smokeless tobacco  . Not on file    History  Alcohol Use No    Family History  Problem Relation Age of Onset  . Cirrhosis Mother   . Heart disease Father   . Hypertension Father   . Peripheral vascular disease Father     Review of Systems: The review of systems is per the HPI.  All other systems were reviewed and are negative.  Physical Exam: BP 112/80  Pulse 92  Ht 5\' 6"  (1.676 m)  Wt 178 lb 12.8 oz (81.103 kg)  BMI 28.86 kg/m2 Patient is very pleasant and in no acute  distress. Skin is warm and dry. He is very suntanned. Color is normal.  HEENT is unremarkable. Normocephalic/atraumatic. PERRL. Sclera are nonicteric. Neck is supple. No masses. No JVD. Lungs are clear. Cardiac exam shows a regular rate and rhythm. Abdomen is soft. Extremities are without edema. Gait and ROM are intact. No gross neurologic deficits noted.   LABORATORY DATA: EKG today shows sinus rhythm. No acute changes.  Lab Results  Component Value Date   WBC 8.2 12/08/2005   HGB 15.7 12/08/2005   HCT 45.9 12/08/2005   PLT 212 12/08/2005   GLUCOSE 120* 07/07/2007   CHOL 189 07/07/2007   TRIG 185* 07/07/2007   HDL 61.1 07/07/2007   LDLCALC 91 07/07/2007   ALT 33 07/07/2007   AST 21 07/07/2007   NA 141 07/07/2007   K 4.2 07/07/2007   CL 107 07/07/2007   CREATININE 1.0 07/07/2007   [name] 14 07/07/2007   CO2 26 07/07/2007   TSH 1.89 12/08/2005   PSA 0.51 12/08/2005      Assessment / Plan:

## 2011-07-03 ENCOUNTER — Other Ambulatory Visit (INDEPENDENT_AMBULATORY_CARE_PROVIDER_SITE_OTHER): Payer: Medicare Other

## 2011-07-03 ENCOUNTER — Ambulatory Visit (HOSPITAL_COMMUNITY): Payer: Medicare Other | Attending: Cardiology | Admitting: Radiology

## 2011-07-03 VITALS — BP 122/68 | Ht 66.0 in | Wt 180.0 lb

## 2011-07-03 DIAGNOSIS — I2510 Atherosclerotic heart disease of native coronary artery without angina pectoris: Secondary | ICD-10-CM

## 2011-07-03 DIAGNOSIS — R0989 Other specified symptoms and signs involving the circulatory and respiratory systems: Secondary | ICD-10-CM | POA: Insufficient documentation

## 2011-07-03 DIAGNOSIS — Z8249 Family history of ischemic heart disease and other diseases of the circulatory system: Secondary | ICD-10-CM | POA: Insufficient documentation

## 2011-07-03 DIAGNOSIS — I10 Essential (primary) hypertension: Secondary | ICD-10-CM | POA: Insufficient documentation

## 2011-07-03 DIAGNOSIS — R079 Chest pain, unspecified: Secondary | ICD-10-CM

## 2011-07-03 DIAGNOSIS — R5383 Other fatigue: Secondary | ICD-10-CM | POA: Insufficient documentation

## 2011-07-03 DIAGNOSIS — E785 Hyperlipidemia, unspecified: Secondary | ICD-10-CM

## 2011-07-03 DIAGNOSIS — R0609 Other forms of dyspnea: Secondary | ICD-10-CM | POA: Insufficient documentation

## 2011-07-03 DIAGNOSIS — Z87891 Personal history of nicotine dependence: Secondary | ICD-10-CM | POA: Insufficient documentation

## 2011-07-03 DIAGNOSIS — R5381 Other malaise: Secondary | ICD-10-CM | POA: Insufficient documentation

## 2011-07-03 LAB — BASIC METABOLIC PANEL
BUN: 23 mg/dL (ref 6–23)
CO2: 24 mEq/L (ref 19–32)
Calcium: 9.2 mg/dL (ref 8.4–10.5)
Chloride: 109 mEq/L (ref 96–112)
Creatinine, Ser: 1 mg/dL (ref 0.4–1.5)
GFR: 78.7 mL/min (ref >60.00)
Glucose, Bld: 109 mg/dL — ABNORMAL HIGH (ref 70–99)
Potassium: 5 mEq/L (ref 3.5–5.1)
Sodium: 141 mEq/L (ref 135–145)

## 2011-07-03 LAB — HEPATIC FUNCTION PANEL
ALT: 37 U/L (ref 0–53)
AST: 25 U/L (ref 0–37)
Albumin: 3.8 g/dL (ref 3.5–5.2)
Alkaline Phosphatase: 53 U/L (ref 39–117)
Bilirubin, Direct: 0 mg/dL (ref 0.0–0.3)
Total Bilirubin: 0.4 mg/dL (ref 0.3–1.2)
Total Protein: 6.7 g/dL (ref 6.0–8.3)

## 2011-07-03 LAB — LDL CHOLESTEROL, DIRECT: Direct LDL: 105.5 mg/dL

## 2011-07-03 LAB — LIPID PANEL
Cholesterol: 199 mg/dL (ref 0–200)
HDL: 58.1 mg/dL (ref >39.00)
Total CHOL/HDL Ratio: 3
Triglycerides: 299 mg/dL — ABNORMAL HIGH (ref 0.0–149.0)
VLDL: 59.8 mg/dL — ABNORMAL HIGH (ref 0.0–40.0)

## 2011-07-03 MED ORDER — TECHNETIUM TC 99M TETROFOSMIN IV KIT
10.9000 | PACK | Freq: Once | INTRAVENOUS | Status: AC | PRN
  Administered 2011-07-03: 10.9 via INTRAVENOUS

## 2011-07-03 MED ORDER — REGADENOSON 0.4 MG/5ML IV SOLN
0.4000 mg | Freq: Once | INTRAVENOUS | Status: AC
  Administered 2011-07-03: 0.4 mg via INTRAVENOUS

## 2011-07-03 MED ORDER — TECHNETIUM TC 99M TETROFOSMIN IV KIT
33.0000 | PACK | Freq: Once | INTRAVENOUS | Status: AC | PRN
  Administered 2011-07-03: 33 via INTRAVENOUS

## 2011-07-03 NOTE — Progress Notes (Signed)
[facility] SITE 3 NUCLEAR MED [address] [place] [address] [phone]  Cardiology Nuclear Med Study  Roy [name] is a 66 y.o. male     MRN : [id number]     DOB: 1946/01/05  Procedure Date: 07/03/2011  Nuclear Med Background Indication for Stress Test:  Evaluation for Ischemia, Stent Patency and PTCA Patency History:  2/01 Heart Cath: RCA 80% PTCA/Stent: RCA '11: MPS:EF:68%  NL  ECHO: Stress EF 55% Cardiac Risk Factors: Family History - CAD, History of Smoking, Hypertension and Lipids  Symptoms:  Chest Pain, DOE and Fatigue   Nuclear Pre-Procedure Caffeine/Decaff Intake:  None NPO After: 8:00pm   Lungs:  clear O2 Sat: 95% on room air. IV 0.9% NS with Angio Cath:  20g  IV Site: R Antecubital  IV Started by:  Bonnita Levan, RN  Chest Size (in):  46 Cup Size: n/a  Height: 5\' 6"  (1.676 m)  Weight:  180 lb (81.647 kg)  BMI:  Body mass index is 29.05 kg/(m^2). Tech Comments:  N/A    Nuclear Med Study 1 or 2 day study: 1 day  Stress Test Type:  Lexiscan  Reading MD: Marca Ancona, MD  Order Authorizing Provider:  Charlton Haws, MD  Resting Radionuclide: Technetium 57m Tetrofosmin  Resting Radionuclide Dose: 10.9 mCi   Stress Radionuclide:  Technetium 53m Tetrofosmin  Stress Radionuclide Dose: 31.0 mCi           Stress Protocol Rest HR: 67 Stress HR: 98  Rest BP: 122/68 Stress BP: 122/84  Exercise Time (min): n/a METS: n/a   Predicted Max HR: 155 bpm % Max HR: 63.23 bpm Rate Pressure Product: 21308   Dose of Adenosine (mg):  n/a Dose of Lexiscan: 0.4 mg  Dose of Atropine (mg): n/a Dose of Dobutamine: n/a mcg/kg/min (at max HR)  Stress Test Technologist: Milana Na, EMT-P  Nuclear Technologist:  Doyne Keel, CNMT     Rest Procedure:  Myocardial perfusion imaging was performed at rest 45 minutes following the intravenous administration of Technetium 20m Tetrofosmin. Rest ECG: NSR - Normal EKG  Stress Procedure:  The patient received IV  Lexiscan 0.4 mg over 15-seconds.  Technetium 6m Tetrofosmin injected at 30-seconds.  There were no significant changes, + sob, and abdominal pain with Lexiscan.  Quantitative spect images were obtained after a 45 minute delay. Stress ECG: No significant change from baseline ECG  QPS Raw Data Images:  Normal; no motion artifact; normal heart/lung ratio. Stress Images:  Normal homogeneous uptake in all areas of the myocardium. Rest Images:  Normal homogeneous uptake in all areas of the myocardium. Subtraction (SDS):  There is no evidence of scar or ischemia. Transient Ischemic Dilatation (Normal <1.22):  0.97 Lung/Heart Ratio (Normal <0.45):  0.32  Quantitative Gated Spect Images QGS EDV:  79 ml QGS ESV:  28 ml  Impression Exercise Capacity:  Lexiscan with no exercise. BP Response:  Normal blood pressure response. Clinical Symptoms:  Shortness of breath.  ECG Impression:  No significant ST segment change suggestive of ischemia. Comparison with Prior Nuclear Study: No significant change from previous study  Overall Impression:  Normal stress nuclear study.  LV Ejection Fraction: 65%.  LV Wall Motion:  NL LV Function; NL Wall Motion  [employer]

## 2011-07-07 ENCOUNTER — Other Ambulatory Visit: Payer: Self-pay | Admitting: Nurse Practitioner

## 2011-07-07 DIAGNOSIS — E785 Hyperlipidemia, unspecified: Secondary | ICD-10-CM

## 2011-07-07 DIAGNOSIS — I10 Essential (primary) hypertension: Secondary | ICD-10-CM

## 2011-07-07 DIAGNOSIS — I2510 Atherosclerotic heart disease of native coronary artery without angina pectoris: Secondary | ICD-10-CM

## 2011-07-24 ENCOUNTER — Other Ambulatory Visit: Payer: Self-pay | Admitting: Nurse Practitioner

## 2011-07-24 DIAGNOSIS — I10 Essential (primary) hypertension: Secondary | ICD-10-CM

## 2011-07-24 DIAGNOSIS — E785 Hyperlipidemia, unspecified: Secondary | ICD-10-CM

## 2011-07-24 DIAGNOSIS — R079 Chest pain, unspecified: Secondary | ICD-10-CM

## 2011-07-24 MED ORDER — OMEPRAZOLE 20 MG PO CPDR: 20.0000 mg | DELAYED_RELEASE_CAPSULE | Freq: Every day | ORAL | Status: DC

## 2011-11-27 ENCOUNTER — Other Ambulatory Visit: Payer: Self-pay | Admitting: Cardiovascular Disease

## 2011-11-27 MED ORDER — PRAVASTATIN SODIUM 80 MG PO TABS: 80.0000 mg | ORAL_TABLET | Freq: Every day | ORAL | Status: DC

## 2011-11-27 NOTE — Telephone Encounter (Signed)
Fax Received. Refill Completed. Roy [name] (R.M.A)   

## 2011-12-05 ENCOUNTER — Telehealth: Payer: Self-pay | Admitting: Cardiovascular Disease

## 2011-12-05 MED ORDER — LISINOPRIL 5 MG PO TABS: 5.0000 mg | ORAL_TABLET | Freq: Every day | ORAL | Status: DC

## 2011-12-05 NOTE — Telephone Encounter (Signed)
plz return call to pt [phone] regarding lisinopril Med refill

## 2011-12-05 NOTE — Telephone Encounter (Signed)
I spoke with pt and reviewed his medication list with him.  It appears at an ov 06/27/11 he was supposed to stop his Lisinopril and start Olmesartan.  Pt did not seem aware of this. Reviewed all medications.  He has white pills mixed with blue pills in a bottle.  He is not sure if he is taking carvedilol or olmesartan. [facility]. Pt has not had carvedilol filled since last year. Has not picked up Olmesartan  Spoke with pt again. He is taking his Carvedilol, omeprazole, pravastatin and Lisinopril  His cough resolved after seeing Lawson Fiscal in June. He did not pick up the Olmesartan  I have sent in a refill for the Lisinopril as he never started the Olmesartan [name] RN

## 2011-12-05 NOTE — Telephone Encounter (Signed)
Noted  

## 2011-12-30 ENCOUNTER — Other Ambulatory Visit (INDEPENDENT_AMBULATORY_CARE_PROVIDER_SITE_OTHER): Payer: Medicare Other

## 2011-12-30 DIAGNOSIS — I10 Essential (primary) hypertension: Secondary | ICD-10-CM

## 2011-12-30 DIAGNOSIS — I2510 Atherosclerotic heart disease of native coronary artery without angina pectoris: Secondary | ICD-10-CM

## 2011-12-30 DIAGNOSIS — E785 Hyperlipidemia, unspecified: Secondary | ICD-10-CM

## 2011-12-30 LAB — BASIC METABOLIC PANEL
BUN: 17 mg/dL (ref 6–23)
CO2: 26 mEq/L (ref 19–32)
Calcium: 9.4 mg/dL (ref 8.4–10.5)
Chloride: 104 mEq/L (ref 96–112)
Creatinine, Ser: 0.9 mg/dL (ref 0.4–1.5)
GFR: 88.63 mL/min (ref >60.00)
Glucose, Bld: 127 mg/dL — ABNORMAL HIGH (ref 70–99)
Potassium: 4.3 mEq/L (ref 3.5–5.1)
Sodium: 137 mEq/L (ref 135–145)

## 2011-12-30 LAB — LDL CHOLESTEROL, DIRECT: Direct LDL: 115.9 mg/dL

## 2011-12-30 LAB — LIPID PANEL
Cholesterol: 210 mg/dL — ABNORMAL HIGH (ref 0–200)
HDL: 52.9 mg/dL (ref >39.00)
Total CHOL/HDL Ratio: 4
Triglycerides: 291 mg/dL — ABNORMAL HIGH (ref 0.0–149.0)
VLDL: 58.2 mg/dL — ABNORMAL HIGH (ref 0.0–40.0)

## 2011-12-30 LAB — HEPATIC FUNCTION PANEL
ALT: 49 U/L (ref 0–53)
AST: 29 U/L (ref 0–37)
Albumin: 4.1 g/dL (ref 3.5–5.2)
Alkaline Phosphatase: 59 U/L (ref 39–117)
Bilirubin, Direct: 0 mg/dL (ref 0.0–0.3)
Total Bilirubin: 0.7 mg/dL (ref 0.3–1.2)
Total Protein: 7.5 g/dL (ref 6.0–8.3)

## 2012-02-06 ENCOUNTER — Telehealth: Payer: Self-pay | Admitting: Cardiovascular Disease

## 2012-02-06 NOTE — Telephone Encounter (Signed)
New problem:   C/O chest pressure. Nitro taken on 1/12 & 1/14 .rest during that time with  No problem .  Nitro taken on  These days 1/19, 1/20, 1/21, 1/23. No emergency visit.   H/o stent in the past.

## 2012-02-06 NOTE — Telephone Encounter (Signed)
LMTCB ./CY 

## 2012-02-06 NOTE — Telephone Encounter (Signed)
SPOKE WITH PT  RE MESSAGE  WITH    LISTED EPISODES  ALSO C/O  BILATERAL ARM NUMBNESS  ALL EPISODES  HAPPENED  AT REST   AFTER EACH EPISODE PAIN RESOLVED AND WAS ABLE TO BE VERY ACTIVE  THE REST OF DAY  ONLY HAD TO TAKE 1 NTG  AT EACH TIME WITH RELIEF AFTER 15 MIN  STATED SYMPTOMS WERE SIMILAR  AS TO WHEN HAD TO HAVE STENT DONE  IN 2001  ENCOURAGED TO GO TO ER   FOR EVAL  AND TREATMENT REFUSES AT  THIS TIME APPT MADE WITH LORI GERHARDT NP FOR 02-10-12 AT 10:00 AM  INFORMED PT IF S/S WORSEN  BETWEEN NOW AND APPT TIME TO GO TO ER FOR EVAL AND TREATMENT PT VERBALIZED UNDERSTANDING./CY

## 2012-02-10 ENCOUNTER — Other Ambulatory Visit: Payer: Self-pay | Admitting: Nurse Practitioner

## 2012-02-10 ENCOUNTER — Ambulatory Visit (INDEPENDENT_AMBULATORY_CARE_PROVIDER_SITE_OTHER): Payer: Medicare Other | Admitting: Nurse Practitioner

## 2012-02-10 ENCOUNTER — Ambulatory Visit
Admission: RE | Admit: 2012-02-10 | Discharge: 2012-02-10 | Disposition: A | Discharge status: Home / Self Care | Payer: Medicare Other | Source: Ambulatory Visit | Attending: Nurse Practitioner | Admitting: Nurse Practitioner

## 2012-02-10 ENCOUNTER — Encounter: Payer: Self-pay | Admitting: Nurse Practitioner

## 2012-02-10 VITALS — BP 130/78 | HR 84 | Ht 66.0 in | Wt 179.8 lb

## 2012-02-10 DIAGNOSIS — R079 Chest pain, unspecified: Secondary | ICD-10-CM

## 2012-02-10 DIAGNOSIS — Z01810 Encounter for preprocedural cardiovascular examination: Secondary | ICD-10-CM

## 2012-02-10 LAB — BASIC METABOLIC PANEL
BUN: 16 mg/dL (ref 6–23)
CO2: 31 mEq/L (ref 19–32)
Calcium: 9.4 mg/dL (ref 8.4–10.5)
Chloride: 106 mEq/L (ref 96–112)
Creatinine, Ser: 1.1 mg/dL (ref 0.4–1.5)
GFR: 71.94 mL/min (ref >60.00)
Glucose, Bld: 71 mg/dL (ref 70–99)
Potassium: 4.2 mEq/L (ref 3.5–5.1)
Sodium: 143 mEq/L (ref 135–145)

## 2012-02-10 LAB — CBC WITH DIFFERENTIAL/PLATELET
Basophils Absolute: 0 10*3/uL (ref 0.0–0.1)
Basophils Relative: 0.2 % (ref 0.0–3.0)
Eosinophils Absolute: 0.1 10*3/uL (ref 0.0–0.7)
Eosinophils Relative: 1.2 % (ref 0.0–5.0)
HCT: 38.2 % — ABNORMAL LOW (ref 39.0–52.0)
Hemoglobin: 13.2 g/dL (ref 13.0–17.0)
Lymphocytes Relative: 17.8 % (ref 12.0–46.0)
Lymphs Abs: 1.5 10*3/uL (ref 0.7–4.0)
MCHC: 34.5 g/dL (ref 30.0–36.0)
MCV: 94.3 fl (ref 78.0–100.0)
Monocytes Absolute: 0.9 10*3/uL (ref 0.1–1.0)
Monocytes Relative: 10.6 % (ref 3.0–12.0)
Neutro Abs: 5.7 10*3/uL (ref 1.4–7.7)
Neutrophils Relative %: 70.2 % (ref 43.0–77.0)
Platelets: 218 10*3/uL (ref 150.0–400.0)
RBC: 4.05 Mil/uL — ABNORMAL LOW (ref 4.22–5.81)
RDW: 13.3 % (ref 11.5–14.6)
WBC: 8.2 10*3/uL (ref 4.5–10.5)

## 2012-02-10 MED ORDER — NITROGLYCERIN 0.4 MG SL SUBL: 0.4000 mg | SUBLINGUAL_TABLET | SUBLINGUAL | Status: DC | PRN

## 2012-02-10 MED ORDER — CARVEDILOL 3.125 MG PO TABS: 3.1250 mg | ORAL_TABLET | Freq: Two times a day (BID) | ORAL | Status: DC

## 2012-02-10 NOTE — Progress Notes (Addendum)
Roy [name] Date of Birth: [birth year] Medical Record #[id number]  History of Present Illness: Roy [name] is seen back today for a work in visit. He is seen for Dr. Eden Emms. He has known CAD with remote stent to the RCA back in 2001. Last stress test in 2013. Other problems include HTN, HLD, OSA with refusal to use CPAP and depression.   He was last here in June. Referred for stress testing at that time. This was normal.   He comes in today. He reports that he has been out of the country for most of this month [place] Had 5 spells where after waking up he had chest discomfort that radiated to both arms. No other associated symptoms. He used NTG with each spell. NTG x 1 with each spell. Was very active during the course of his days. Did have excessive alcohol at night. 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 No real exertional symptoms at this time. No spells since he has been home. He is only taking his Coreg once a day. Says he is too lazy to take it twice a day. He is also not sure about the pills in his bottle of Coreg. Some are tiny and white and some are larger and blue.   Current Outpatient Prescriptions on File Prior to Visit  Medication Sig Dispense Refill  . aspirin EC 81 MG tablet Take 81 mg by mouth daily.      . carvedilol (COREG) 3.125 MG tablet Take 3.125 mg by mouth daily.      Marland Kitchen lisinopril (PRINIVIL,ZESTRIL) 5 MG tablet Take 1 tablet (5 mg total) by mouth daily.  90 tablet  3  . Multiple Vitamins-Minerals (CENTRUM SILVER PO) Take by mouth.      . nitroGLYCERIN (NITROSTAT) 0.4 MG SL tablet Place 0.4 mg under the tongue every 5 (five) minutes as needed.      Marland Kitchen omeprazole (PRILOSEC) 20 MG capsule Take 1 capsule (20 mg total) by mouth daily.  90 capsule  3  . pravastatin (PRAVACHOL) 80 MG tablet Take 1 tablet (80 mg total) by mouth daily.  90 tablet  3  . venlafaxine (EFFEXOR XR) 75 MG 24 hr capsule TAKE 1 CAPSULE ONCE DAILY FOR DEPRESSION.  90 capsule  3    No  Known Allergies  Past Medical History  Diagnosis Date  . CAD (coronary artery disease)     stent to the RCA in 2001. Negative Myoview in January 2012  . HTN (hypertension)   . HLD (hyperlipidemia)   . Depression   . Vertigo   . OSA (obstructive sleep apnea)     Past Surgical History  Procedure Date  . Coronary stent placement 2001     RCA  . Tonsillectomy     History  Smoking status  . Former Smoker  . Quit date: 01/14/2000  Smokeless tobacco  . Not on file    History  Alcohol Use No    Family History  Problem Relation Age of Onset  . Cirrhosis Mother   . Heart disease Father   . Hypertension Father   . Peripheral vascular disease Father     Review of Systems: The review of systems is per the HPI.  All other systems were reviewed and are negative.  Physical Exam: BP 130/78  Pulse 84  Ht 5\' 6"  (1.676 m)  Wt 179 lb 12.8 oz (81.557 kg)  BMI 29.02 kg/m2 Patient is very pleasant and in no acute distress. Skin is  warm and dry. Color is normal but he is very suntanned.  HEENT is unremarkable. Normocephalic/atraumatic. PERRL. Sclera are nonicteric. Neck is supple. No masses. No JVD. Lungs are clear. Cardiac exam shows a regular rate and rhythm. Abdomen is soft. Extremities are without edema. Gait and ROM are intact. No gross neurologic deficits noted.   LABORATORY DATA: EKG today shows sinus rhythm and is normal. Other labs are pending today. CXR is pending.  Lab Results  Component Value Date   WBC 8.2 12/08/2005   HGB 15.7 12/08/2005   HCT 45.9 12/08/2005   PLT 212 12/08/2005   GLUCOSE 127* 12/30/2011   CHOL 210* 12/30/2011   TRIG 291.0* 12/30/2011   HDL 52.90 12/30/2011   LDLDIRECT 115.9 12/30/2011   LDLCALC 91 07/07/2007   ALT 49 12/30/2011   AST 29 12/30/2011   NA 137 12/30/2011   K 4.3 12/30/2011   CL 104 12/30/2011   CREATININE 0.9 12/30/2011   [name] 17 12/30/2011   CO2 26 12/30/2011   TSH 1.89 12/08/2005   PSA 0.51 12/08/2005   Myoview Overall  Impression from June 2013:  Normal stress nuclear study.  LV Ejection Fraction: 65%. LV Wall Motion: NL LV Function; NL Wall Motion   Roy [name]    Assessment / Plan: 1. Chest pain - with known CAD and remote stent. Now with recurrent chest pain earlier this month - 5 spells requiring NTG. His NTG was old. Recent negative Myoview. We will need to refer on for cardiac cath. The procedure, risks and benefits have been reviewed and he is willing to proceed. Coreg is increased to BID. NTG is refilled today. Will check labs and send for CXR today.   2. HTN - get back on Coreg BID  3. HLD  Patient is agreeable to this plan and will call if any problems develop in the interim.

## 2012-02-10 NOTE — Patient Instructions (Addendum)
We need to check labs today  Go to [facility] for a CXR - you can walk in  You are scheduled for a cardiac catheterization on Friday, January 31st with Dr. [place] or associate.  Go to [facility] 2nd Floor Short Stay on Friday, January 31st  at 7AM  No food or drink after midnight on Thursday. You may take your medications with a sip of water on the day of your procedure.   Stay on your current medicines  Take the Coreg two times a day  I have refilled your NTG today to your drug store  Coronary Angiography Coronary angiography is an X-ray procedure used to look at the arteries in the heart. In this procedure, a dye is injected through a long, hollow tube (catheter). The catheter is about the size of a piece of cooked spaghetti. The catheter injects a dye into an artery in your groin. X-rays are then taken to show if there is a blockage in the arteries of your heart. BEFORE THE PROCEDURE   Let your caregiver know if you have allergies to shellfish or contrast dye. Also let your caregiver know if you have kidney problems or failure.  Do not eat or drink starting from midnight up to the time of the procedure, or as directed.  You may drink enough water to take your medications the morning of the procedure if you were instructed to do so.  You should be at the hospital or outpatient facility where the procedure is to be done 60 minutes prior to the procedure or as directed. PROCEDURE  You may be given an IV medication to help you relax before the procedure.  You will be prepared for the procedure by washing and shaving the area where the catheter will be inserted. This is usually done in the groin but may be done in the fold of your arm by your elbow.  A medicine will be given to numb your groin where the catheter will be inserted.  A specially trained doctor will insert the catheter into an artery in your groin. The catheter is guided by using a special  type of X-ray (fluoroscopy) to the blood vessel being examined.  A special dye is then injected into the catheter and X-rays are taken. The dye helps to show where any narrowing or blockages are located in the heart arteries. AFTER THE PROCEDURE   After the procedure you will be kept in bed lying flat for several hours. You will be instructed to not bend or cross your legs.  The groin insertion site will be watched and checked frequently.  The pulse in your feet will be checked frequently.  Additional blood tests, X-rays and an EKG may be done.  You may stay in the hospital overnight for observation. SEEK IMMEDIATE MEDICAL CARE IF:   You develop chest pain, shortness of breath, feel faint, or pass out.  There is bleeding, swelling, or drainage from the catheter insertion site.  You develop pain, discoloration, coldness, or severe bruising in the leg or area where the catheter was inserted.  You have a fever. Document Released: 07/06/2002 Document Revised: 03/24/2011 Document Reviewed: 08/25/2007 [facility] Patient Information 2013 [place], [place].

## 2012-02-11 LAB — APTT: aPTT: 25.3 s (ref 21.7–28.8)

## 2012-02-11 LAB — PROTIME-INR
INR: 1 ratio (ref 0.8–1.0)
Prothrombin Time: 10.9 s (ref 10.2–12.4)

## 2012-02-13 ENCOUNTER — Encounter (HOSPITAL_COMMUNITY)
Admission: RE | Disposition: A | Discharge status: Home / Self Care | Payer: Self-pay | Source: Ambulatory Visit | Attending: Cardiology

## 2012-02-13 ENCOUNTER — Ambulatory Visit (HOSPITAL_COMMUNITY)
Admission: RE | Admit: 2012-02-13 | Discharge: 2012-02-13 | Disposition: A | Discharge status: Home / Self Care | Payer: Medicare Other | Source: Ambulatory Visit | Attending: Cardiology | Admitting: Cardiology

## 2012-02-13 DIAGNOSIS — F329 Major depressive disorder, single episode, unspecified: Secondary | ICD-10-CM | POA: Insufficient documentation

## 2012-02-13 DIAGNOSIS — Z9861 Coronary angioplasty status: Secondary | ICD-10-CM | POA: Insufficient documentation

## 2012-02-13 DIAGNOSIS — Z79899 Other long term (current) drug therapy: Secondary | ICD-10-CM | POA: Insufficient documentation

## 2012-02-13 DIAGNOSIS — F3289 Other specified depressive episodes: Secondary | ICD-10-CM | POA: Insufficient documentation

## 2012-02-13 DIAGNOSIS — Z7902 Long term (current) use of antithrombotics/antiplatelets: Secondary | ICD-10-CM | POA: Insufficient documentation

## 2012-02-13 DIAGNOSIS — Z7982 Long term (current) use of aspirin: Secondary | ICD-10-CM | POA: Insufficient documentation

## 2012-02-13 DIAGNOSIS — I2510 Atherosclerotic heart disease of native coronary artery without angina pectoris: Secondary | ICD-10-CM

## 2012-02-13 DIAGNOSIS — I10 Essential (primary) hypertension: Secondary | ICD-10-CM | POA: Insufficient documentation

## 2012-02-13 DIAGNOSIS — R079 Chest pain, unspecified: Secondary | ICD-10-CM

## 2012-02-13 DIAGNOSIS — G4733 Obstructive sleep apnea (adult) (pediatric): Secondary | ICD-10-CM | POA: Insufficient documentation

## 2012-02-13 DIAGNOSIS — Z01810 Encounter for preprocedural cardiovascular examination: Secondary | ICD-10-CM

## 2012-02-13 DIAGNOSIS — E785 Hyperlipidemia, unspecified: Secondary | ICD-10-CM | POA: Insufficient documentation

## 2012-02-13 HISTORY — PX: LEFT HEART CATHETERIZATION WITH CORONARY ANGIOGRAM: SHX5451

## 2012-02-13 SURGERY — LEFT HEART CATHETERIZATION WITH CORONARY ANGIOGRAM
Anesthesia: LOCAL

## 2012-02-13 MED ORDER — ONDANSETRON HCL 4 MG/2ML IJ SOLN: 4.0000 mg | Freq: Four times a day (QID) | INTRAMUSCULAR | Status: DC | PRN

## 2012-02-13 MED ORDER — FENTANYL CITRATE 0.05 MG/ML IJ SOLN
INTRAMUSCULAR | Status: AC
  Filled 2012-02-13: qty 2

## 2012-02-13 MED ORDER — ASPIRIN 81 MG PO CHEW
324.0000 mg | CHEWABLE_TABLET | ORAL | Status: AC
  Administered 2012-02-13: 324 mg via ORAL
  Filled 2012-02-13: qty 4

## 2012-02-13 MED ORDER — SODIUM CHLORIDE 0.9 % IV SOLN: 1.0000 mL/kg/h | INTRAVENOUS | Status: DC

## 2012-02-13 MED ORDER — ACETAMINOPHEN 325 MG PO TABS: 650.0000 mg | ORAL_TABLET | ORAL | Status: DC | PRN

## 2012-02-13 MED ORDER — HEPARIN (PORCINE) IN NACL 2-0.9 UNIT/ML-% IJ SOLN
INTRAMUSCULAR | Status: AC
  Filled 2012-02-13: qty 1000

## 2012-02-13 MED ORDER — SODIUM CHLORIDE 0.9 % IV SOLN
INTRAVENOUS | Status: DC
  Administered 2012-02-13: 07:00:00 via INTRAVENOUS

## 2012-02-13 MED ORDER — SODIUM CHLORIDE 0.9 % IV SOLN: 250.0000 mL | INTRAVENOUS | Status: DC | PRN

## 2012-02-13 MED ORDER — SODIUM CHLORIDE 0.9 % IJ SOLN: 3.0000 mL | Freq: Two times a day (BID) | INTRAMUSCULAR | Status: DC

## 2012-02-13 MED ORDER — MIDAZOLAM HCL 2 MG/2ML IJ SOLN
INTRAMUSCULAR | Status: AC
  Filled 2012-02-13: qty 2

## 2012-02-13 MED ORDER — HEPARIN SODIUM (PORCINE) 1000 UNIT/ML IJ SOLN
INTRAMUSCULAR | Status: AC
  Filled 2012-02-13: qty 1

## 2012-02-13 MED ORDER — VERAPAMIL HCL 2.5 MG/ML IV SOLN
INTRAVENOUS | Status: AC
  Filled 2012-02-13: qty 2

## 2012-02-13 MED ORDER — DIAZEPAM 5 MG PO TABS
10.0000 mg | ORAL_TABLET | ORAL | Status: AC
  Administered 2012-02-13: 10 mg via ORAL
  Filled 2012-02-13: qty 2

## 2012-02-13 MED ORDER — ISOSORBIDE MONONITRATE ER 30 MG PO TB24: 30.0000 mg | ORAL_TABLET | Freq: Every day | ORAL | Status: DC

## 2012-02-13 MED ORDER — CARVEDILOL 3.125 MG PO TABS: 6.2500 mg | ORAL_TABLET | Freq: Two times a day (BID) | ORAL | Status: DC

## 2012-02-13 MED ORDER — SODIUM CHLORIDE 0.9 % IJ SOLN: 3.0000 mL | INTRAMUSCULAR | Status: DC | PRN

## 2012-02-13 MED ORDER — LIDOCAINE HCL (PF) 1 % IJ SOLN
INTRAMUSCULAR | Status: AC
  Filled 2012-02-13: qty 30

## 2012-02-13 MED ORDER — ATORVASTATIN CALCIUM 80 MG PO TABS: 80.0000 mg | ORAL_TABLET | Freq: Every day | ORAL | Status: DC

## 2012-02-13 MED ORDER — NITROGLYCERIN 1 MG/10 ML FOR IR/CATH LAB
INTRA_ARTERIAL | Status: AC
  Filled 2012-02-13: qty 10

## 2012-02-13 NOTE — CV Procedure (Signed)
   Cardiac Catheterization Procedure Note  Name: Roy [name] MRN: [id number] DOB: 06-20-1945  Procedure: Left Heart Cath, Selective Coronary Angiography, LV angiography  Indication: 67 year-old white male with history of hyperlipidemia and coronary disease. He has experienced recent symptoms of chest pain at rest. Some relief with sublingual nitroglycerin. He had remote stenting of the distal right coronary in 2001.   Procedural Details: The right wrist was prepped, draped, and anesthetized with 1% lidocaine. Using the modified Seldinger technique, a 5 French sheath was introduced into the right radial artery. 3 mg of verapamil was administered through the sheath, weight-based unfractionated heparin was administered intravenously. Standard Judkins catheters were used for selective coronary angiography and left ventriculography. Catheter exchanges were performed over an exchange length guidewire. There were no immediate procedural complications. A TR band was used for radial hemostasis at the completion of the procedure.  The patient was transferred to the post catheterization recovery area for further monitoring.  Procedural Findings: Hemodynamics: AO 131/75 with a mean of 102 mmHg LV 142/25 mmHg  Coronary angiography: Coronary dominance: right  Left mainstem: The left main is very short with essentially a shared ostium. No significant stenosis.  Left anterior descending (LAD): The LAD is severely calcified in the proximal vessel. There is diffuse 30% disease in the proximal LAD. In the mid vessel the LAD gives rise to a first septal perforator, first diagonal, and then immediately a second diagonal branch. The LAD in this segment has a diffuse 40-50% stenosis. The first diagonal branch has 40-50% disease proximally. The second diagonal branch has a focal 80-90% stenosis. This vessel is moderate in size and moderately calcified.  Left circumflex (LCx): The left circumflex gives rise to  a small first obtuse marginal vessel before terminating on the lateral wall is a second marginal branch. There is 30% disease proximally. The remainder the vessel has scattered irregularities.  Right coronary artery (RCA): The right coronary is a large dominant vessel. In the mid vessel there is a segmental 30% stenosis which is heavily calcified. There is a stent noted in the distal vessel which is widely patent with diffuse 20-30% disease.  Left ventriculography: Left ventricular systolic function is normal, LVEF is estimated at 55-65%, there is no significant mitral regurgitation   Final Conclusions:   1. Single vessel obstructive coronary disease involving the second diagonal branch. The stent in the distal RCA is widely patent.  2. Normal left ventricular function.  Recommendations: I would recommend initial medical therapy. We will increase his carvedilol to 6.25 mg twice a day. We will start him on Imdur 30 mg daily. We will switch him from pravastatin to Lipitor 80 mg daily. We'll follow him closely. If he has refractory angina despite good medical therapy we could consider PCI of the second diagonal branch.  Lottie Siska [facility] 02/13/2012, 9:22 AM

## 2012-02-13 NOTE — Progress Notes (Signed)
TRB deflateed per protocol.  Band removed, site CDI and occlusive dressing applied. Instructions reinforced with patient.

## 2012-02-13 NOTE — Interval H&P Note (Signed)
History and Physical Interval Note:  02/13/2012 8:32 AM  Roy [name]  has presented today for surgery, with the diagnosis of cp  The various methods of treatment have been discussed with the patient and family. After consideration of risks, benefits and other options for treatment, the patient has consented to  Procedure(s) (LRB) with comments: LEFT HEART CATHETERIZATION WITH CORONARY ANGIOGRAM (N/A) as a surgical intervention .  The patient's history has been reviewed, patient examined, no change in status, stable for surgery.  I have reviewed the patient's chart and labs.  Questions were answered to the patient's satisfaction.     Theron Arista [facility] 02/13/2012 8:32 AM

## 2012-02-13 NOTE — H&P (View-Only) (Signed)
 Roy [name] Date of Birth: 05/26/1945 Medical Record #[id number]  History of Present Illness: Roy [name] is seen back today for a work in visit. He is seen for Dr. Nishan. He has known CAD with remote stent to the RCA back in 2001. Last stress test in 2013. Other problems include HTN, HLD, OSA with refusal to use CPAP and depression.   He was last here in June. Referred for stress testing at that time. This was normal.   He comes in today. He reports that he has been out of the country for most of this month ([place]). Had 5 spells where after waking up he had chest discomfort that radiated to both arms. No other associated symptoms. He used NTG with each spell. NTG x 1 with each spell. Was very active during the course of his days. Did have excessive alcohol at night. 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 No real exertional symptoms at this time. No spells since he has been home. He is only taking his Coreg once a day. Says he is too lazy to take it twice a day. He is also not sure about the pills in his bottle of Coreg. Some are tiny and white and some are larger and blue.   Current Outpatient Prescriptions on File Prior to Visit  Medication Sig Dispense Refill  . aspirin EC 81 MG tablet Take 81 mg by mouth daily.      . carvedilol (COREG) 3.125 MG tablet Take 3.125 mg by mouth daily.      . lisinopril (PRINIVIL,ZESTRIL) 5 MG tablet Take 1 tablet (5 mg total) by mouth daily.  90 tablet  3  . Multiple Vitamins-Minerals (CENTRUM SILVER PO) Take by mouth.      . nitroGLYCERIN (NITROSTAT) 0.4 MG SL tablet Place 0.4 mg under the tongue every 5 (five) minutes as needed.      . omeprazole (PRILOSEC) 20 MG capsule Take 1 capsule (20 mg total) by mouth daily.  90 capsule  3  . pravastatin (PRAVACHOL) 80 MG tablet Take 1 tablet (80 mg total) by mouth daily.  90 tablet  3  . venlafaxine (EFFEXOR XR) 75 MG 24 hr capsule TAKE 1 CAPSULE ONCE DAILY FOR DEPRESSION.  90 capsule  3    No  Known Allergies  Past Medical History  Diagnosis Date  . CAD (coronary artery disease)     stent to the RCA in 2001. Negative Myoview in January 2012  . HTN (hypertension)   . HLD (hyperlipidemia)   . Depression   . Vertigo   . OSA (obstructive sleep apnea)     Past Surgical History  Procedure Date  . Coronary stent placement 2001     RCA  . Tonsillectomy     History  Smoking status  . Former Smoker  . Quit date: 01/14/2000  Smokeless tobacco  . Not on file    History  Alcohol Use No    Family History  Problem Relation Age of Onset  . Cirrhosis Mother   . Heart disease Father   . Hypertension Father   . Peripheral vascular disease Father     Review of Systems: The review of systems is per the HPI.  All other systems were reviewed and are negative.  Physical Exam: BP 130/78  Pulse 84  Ht 5' 6" (1.676 m)  Wt 179 lb 12.8 oz (81.557 kg)  BMI 29.02 kg/m2 Patient is very pleasant and in no acute distress. Skin is   warm and dry. Color is normal but he is very suntanned.  HEENT is unremarkable. Normocephalic/atraumatic. PERRL. Sclera are nonicteric. Neck is supple. No masses. No JVD. Lungs are clear. Cardiac exam shows a regular rate and rhythm. Abdomen is soft. Extremities are without edema. Gait and ROM are intact. No gross neurologic deficits noted.   LABORATORY DATA: EKG today shows sinus rhythm and is normal. Other labs are pending today. CXR is pending.  Lab Results  Component Value Date   WBC 8.2 12/08/2005   HGB 15.7 12/08/2005   HCT 45.9 12/08/2005   PLT 212 12/08/2005   GLUCOSE 127* 12/30/2011   CHOL 210* 12/30/2011   TRIG 291.0* 12/30/2011   HDL 52.90 12/30/2011   LDLDIRECT 115.9 12/30/2011   LDLCALC 91 07/07/2007   ALT 49 12/30/2011   AST 29 12/30/2011   NA 137 12/30/2011   K 4.3 12/30/2011   CL 104 12/30/2011   CREATININE 0.9 12/30/2011   BUN 17 12/30/2011   CO2 26 12/30/2011   TSH 1.89 12/08/2005   PSA 0.51 12/08/2005   Myoview Overall  Impression from June 2013:  Normal stress nuclear study.  LV Ejection Fraction: 65%. LV Wall Motion: NL LV Function; NL Wall Motion   Roy [name]    Assessment / Plan: 1. Chest pain - with known CAD and remote stent. Now with recurrent chest pain earlier this month - 5 spells requiring NTG. His NTG was old. Recent negative Myoview. We will need to refer on for cardiac cath. The procedure, risks and benefits have been reviewed and he is willing to proceed. Coreg is increased to BID. NTG is refilled today. Will check labs and send for CXR today.   2. HTN - get back on Coreg BID  3. HLD  Patient is agreeable to this plan and will call if any problems develop in the interim.   

## 2012-02-15 ENCOUNTER — Encounter: Payer: Self-pay | Admitting: Nurse Practitioner

## 2012-02-17 ENCOUNTER — Other Ambulatory Visit: Payer: Self-pay

## 2012-02-17 ENCOUNTER — Encounter: Payer: Self-pay | Admitting: Nurse Practitioner

## 2012-02-17 DIAGNOSIS — R079 Chest pain, unspecified: Secondary | ICD-10-CM

## 2012-02-17 MED ORDER — CARVEDILOL 3.125 MG PO TABS: 6.2500 mg | ORAL_TABLET | Freq: Two times a day (BID) | ORAL | Status: DC

## 2012-02-17 NOTE — Telephone Encounter (Signed)
New Problem    Refill Request Carvedilol 3.125 mg to [facility]

## 2012-02-17 NOTE — Telephone Encounter (Signed)
PT dose was up to 3.125mg  2 Tabs = 6.25mg  twice a day after cath that was done by Dr Swaziland

## 2012-02-17 NOTE — Telephone Encounter (Signed)
Opened in Error.

## 2012-02-25 ENCOUNTER — Telehealth: Payer: Self-pay | Admitting: *Deleted

## 2012-02-25 NOTE — Telephone Encounter (Signed)
S/W PT MOVED APPOINTMENT TO 03/03/12 @ 3:00 PT AGREED DUE TO INCLEMENT WEATHER

## 2012-02-25 NOTE — Telephone Encounter (Signed)
LMOM TO CALL AND RESCHEDULE APPT

## 2012-02-27 ENCOUNTER — Encounter: Payer: Medicare Other | Admitting: Nurse Practitioner

## 2012-02-28 ENCOUNTER — Other Ambulatory Visit: Payer: Self-pay

## 2012-03-03 ENCOUNTER — Encounter: Payer: Self-pay | Admitting: Nurse Practitioner

## 2012-03-03 ENCOUNTER — Ambulatory Visit (INDEPENDENT_AMBULATORY_CARE_PROVIDER_SITE_OTHER): Payer: Medicare Other | Admitting: Nurse Practitioner

## 2012-03-03 VITALS — BP 126/80 | HR 74 | Ht 66.0 in | Wt 182.0 lb

## 2012-03-03 DIAGNOSIS — I2510 Atherosclerotic heart disease of native coronary artery without angina pectoris: Secondary | ICD-10-CM

## 2012-03-03 NOTE — Progress Notes (Signed)
Roy Roy [name] Date of Birth: 07/17/1945 Medical Record #[id number]  History of Present Illness: Roy Roy [name] is seen back today for a post cath visit. He is seen for Roy Roy [name]. He has known CAD with remote stent to the RCA back in 2001. Last stress test in 2013. Other issues include HTN, depression, HLD, OSA with refusal to wear CPAP.   I saw him at the end of January. He was having recurrent chest pain. Had just returned from [place] and had "had too good of a time". Referred for repeat cardiac cath. This showed single vessel obstructive disease involving the 2nd diagonal branch. His stent in the distal RCA was patent. Medical management was advised. Coreg was increased to 6.25 mg BID and Imdur was added at 30 mg. Pravachol was switched to Lipitor. PCI could be considered if he has refractory symptoms.   He comes in today. He is here alone. He is doing ok. Tolerating his medicines. Did have some issues initially with headache, but this has resolved. Has only had 2 spells of arm pain. He had tried to go outside in the extreme cold weather to walk his dog. He tried this twice within a 30 minute period and had to just go back inside. Was able to go later that day and had no trouble. Has done fine since. Overall he feels he is doing much better than he had been while in [place]. Drinking less alcohol. Not dizzy. No palpitations.   Current Outpatient Prescriptions on File Prior to Visit  Medication Sig Dispense Refill  . aspirin EC 81 MG tablet Take 81 mg by mouth daily.      Marland Kitchen atorvastatin (LIPITOR) 80 MG tablet Take 1 tablet (80 mg total) by mouth daily.  30 tablet  2  . carvedilol (COREG) 3.125 MG tablet Take 2 tablets (6.25 mg total) by mouth 2 (two) times daily with a meal.  120 tablet  3  . eszopiclone (LUNESTA) 2 MG TABS Take 2 mg by mouth at bedtime. Take immediately before bedtime      . isosorbide mononitrate (IMDUR) 30 MG 24 hr tablet Take 1 tablet (30 mg total) by mouth daily.   30 tablet  2  . lisinopril (PRINIVIL,ZESTRIL) 5 MG tablet Take 1 tablet (5 mg total) by mouth daily.  90 tablet  3  . Multiple Vitamins-Minerals (CENTRUM SILVER PO) Take 1 tablet by mouth daily.       . nitroGLYCERIN (NITROSTAT) 0.4 MG SL tablet Place 1 tablet (0.4 mg total) under the tongue every 5 (five) minutes as needed.  25 tablet  6  . omeprazole (PRILOSEC) 20 MG capsule Take 1 capsule (20 mg total) by mouth daily.  90 capsule  3  . venlafaxine (EFFEXOR XR) 75 MG 24 hr capsule TAKE 1 CAPSULE ONCE DAILY FOR DEPRESSION.  90 capsule  3   No current facility-administered medications on file prior to visit.    No Known Allergies  Past Medical History  Diagnosis Date  . CAD (coronary artery disease)     stent to the RCA in 2001. Negative Myoview in January 2012  . HTN (hypertension)   . HLD (hyperlipidemia)   . Depression   . Vertigo   . OSA (obstructive sleep apnea)     Past Surgical History  Procedure Laterality Date  . Coronary stent placement  2001     RCA  . Tonsillectomy      History  Smoking status  . Former Smoker  .  Quit date: 01/14/2000  Smokeless tobacco  . Not on file    History  Alcohol Use No    Family History  Problem Relation Age of Onset  . Cirrhosis Mother   . Heart disease Father   . Hypertension Father   . Peripheral vascular disease Father     Review of Systems: The review of systems is per the HPI.  All other systems were reviewed and are negative.  Physical Exam: BP 126/80  Pulse 74  Ht 5\' 6"  (1.676 m)  Wt 182 lb (82.555 kg)  BMI 29.39 kg/m2 Patient is very pleasant and in no acute distress. Skin is warm and dry. Color is normal.  HEENT is unremarkable. Normocephalic/atraumatic. PERRL. Sclera are nonicteric. Neck is supple. No masses. No JVD. Lungs are clear. Cardiac exam shows a regular rate and rhythm. Rate was initially a little fast by my exam - in the 90's and then slowed. Abdomen is soft. Extremities are without edema. Gait and  ROM are intact. No gross neurologic deficits noted. Right arm looks fine.    LABORATORY DATA:  Lab Results  Component Value Date   WBC 8.2 02/10/2012   HGB 13.2 02/10/2012   HCT 38.2* 02/10/2012   PLT 218.0 02/10/2012   GLUCOSE 71 02/10/2012   CHOL 210* 12/30/2011   TRIG 291.0* 12/30/2011   HDL 52.90 12/30/2011   LDLDIRECT 115.9 12/30/2011   LDLCALC 91 07/07/2007   ALT 49 12/30/2011   AST 29 12/30/2011   NA 143 02/10/2012   K 4.2 02/10/2012   CL 106 02/10/2012   CREATININE 1.1 02/10/2012   Roy [name] 16 02/10/2012   CO2 31 02/10/2012   TSH 1.89 12/08/2005   PSA 0.51 12/08/2005   INR 1.0 02/10/2012  . Coronary angiography:   Left mainstem: The left main is very short with essentially a shared ostium. No significant stenosis.  Left anterior descending (LAD): The LAD is severely calcified in the proximal vessel. There is diffuse 30% disease in the proximal LAD. In the mid vessel the LAD gives rise to a first septal perforator, first diagonal, and then immediately a second diagonal branch. The LAD in this segment has a diffuse 40-50% stenosis. The first diagonal branch has 40-50% disease proximally. The second diagonal branch has a focal 80-90% stenosis. This vessel is moderate in size and moderately calcified.  Left circumflex (LCx): The left circumflex gives rise to a small first obtuse marginal vessel before terminating on the lateral wall is a second marginal branch. There is 30% disease proximally. The remainder the vessel has scattered irregularities.  Right coronary artery (RCA): The right coronary is a large dominant vessel. In the mid vessel there is a segmental 30% stenosis which is heavily calcified. There is a stent noted in the distal vessel which is widely patent with diffuse 20-30% disease.   Left ventriculography: Left ventricular systolic function is normal, LVEF is estimated at 55-65%, there is no significant mitral regurgitation   Final Conclusions:  1. Single vessel obstructive  coronary disease involving the second diagonal branch. The stent in the distal RCA is widely patent.  2. Normal left ventricular function.   Recommendations: I would recommend initial medical therapy. We will increase his carvedilol to 6.25 mg twice a day. We will start him on Imdur 30 mg daily. We will switch him from pravastatin to Lipitor 80 mg daily. We'll follow him closely. If he has refractory angina despite good medical therapy we could consider PCI of the second diagonal branch.  Theron Arista [facility]  02/13/2012, 9:22 AM  Assessment / Plan: 1. Chest pain - known CAD with recent cath - findings as noted above - he has improved with medical therapy. I have left him on his current regimen for now. See Roy Roy [name] back in 3 months. He is to let us know if he has any change/worsening symptoms and/or using NTG with increasing frequency. PCI could be an option if needed.   2. HLD - now on Lipitor 80 mg. Will need labs on return.   3. HTN - blood pressure looks good.   Patient is agreeable to this plan and will call if any problems develop in the interim.

## 2012-03-03 NOTE — Patient Instructions (Addendum)
Stay on your current medicines  Stay active  See Dr. Eden Emms in 3 months  Call us if you have any worsening of symptoms (chest pain) and/or using more NTG  Call the [facility] Care office at [phone] if you have any questions, problems or concerns.

## 2012-03-22 ENCOUNTER — Other Ambulatory Visit: Payer: Self-pay | Admitting: Internal Medicine

## 2012-04-26 ENCOUNTER — Encounter: Payer: Self-pay | Admitting: Nurse Practitioner

## 2012-04-27 ENCOUNTER — Telehealth: Payer: Self-pay | Admitting: Nurse Practitioner

## 2012-04-27 NOTE — Telephone Encounter (Signed)
LMTCB ./CY 

## 2012-04-27 NOTE — Telephone Encounter (Signed)
Can try to take SL nitro for symptoms Already on imdur cath this January was low risk with medical Rx recommended by Dr Swaziland

## 2012-04-27 NOTE — Telephone Encounter (Signed)
PT  AWARE OF  DR  NISHAN'S  RECOMMENDATIONS ./CY 

## 2012-04-27 NOTE — Telephone Encounter (Signed)
Called patient per Norma Fredrickson, NP request.  Patient sent Lawson Fiscal an email describing: "About three nights per week I am having tightness across my chest and half down my arms. I am not having to take nitro. I just relaxe and slow my breathing and the symptons go away. Usually takes about 5 min. Please advise as to medication. Do I need to increase anything or what. Again, I am having these symptons while laying down and not after exercise." Patient c/o chest tightness only in the morning when he wakes up.  States tightness does not wake him up.  States it radiates halfway down bilateral arms but is resolved with deep breathing.  Patient states tightness is not exertional and is not severe.  Patient would like to move May appointment with Dr. Eden Emms up.  Scheduled patient to see Lawson Fiscal 4/30.  Patient instructed to call us if symptoms worsen or increase in frequency.

## 2012-05-07 ENCOUNTER — Other Ambulatory Visit (HOSPITAL_COMMUNITY): Payer: Self-pay | Admitting: Cardiology

## 2012-05-12 ENCOUNTER — Encounter: Payer: Self-pay | Admitting: Physician Assistant

## 2012-05-12 ENCOUNTER — Ambulatory Visit (INDEPENDENT_AMBULATORY_CARE_PROVIDER_SITE_OTHER): Payer: Medicare Other | Admitting: Physician Assistant

## 2012-05-12 ENCOUNTER — Ambulatory Visit: Payer: Medicare Other | Admitting: Nurse Practitioner

## 2012-05-12 VITALS — BP 122/76 | HR 57 | Ht 66.0 in | Wt 185.0 lb

## 2012-05-12 DIAGNOSIS — I10 Essential (primary) hypertension: Secondary | ICD-10-CM

## 2012-05-12 DIAGNOSIS — I2510 Atherosclerotic heart disease of native coronary artery without angina pectoris: Secondary | ICD-10-CM

## 2012-05-12 DIAGNOSIS — R079 Chest pain, unspecified: Secondary | ICD-10-CM

## 2012-05-12 MED ORDER — ISOSORBIDE MONONITRATE ER 60 MG PO TB24: 60.0000 mg | ORAL_TABLET | Freq: Every day | ORAL | Status: DC

## 2012-05-12 MED ORDER — CARVEDILOL 6.25 MG PO TABS: 6.2500 mg | ORAL_TABLET | Freq: Two times a day (BID) | ORAL | Status: DC

## 2012-05-12 NOTE — Patient Instructions (Addendum)
Your physician recommends that you schedule a follow-up appointment in: 1 month with Dr Eden Emms  Your physician has recommended you make the following change in your medication: INCREASE Isosorbide to 60 mg at night  Your physician has recommended that you decrease your alcohol intake

## 2012-05-12 NOTE — Assessment & Plan Note (Signed)
Roy [name] January 2014 showed patent stent to the RCA and single vessel obstructive disease in the second diagonal. Medical therapy was advised. PCI is possible if he has refractory symptoms. He is having symptoms but they are related to when he drinks alcohol and early in the morning. I've asked him to cut back his alcohol intake and have increased his Imdur to take q.h.s.

## 2012-05-12 NOTE — Assessment & Plan Note (Signed)
Stable

## 2012-05-12 NOTE — Progress Notes (Signed)
HPI:  This is a 67 year old male Patient of Dr.Nishan birth history coronary artery disease status post remote stent to the RCA back in 2001. He recently had a cardiac catheterization in January 2014 that showed single vessel obstructive disease involving the second diagonal branch. The stent in the distal RCA was patent. Medical management was advised. Coreg and Imdur were added. PCI would be considered if he had refractory symptoms.  The patient is having chest tightness radiating to his right elbow in the early morning when he first gets up. This occurs about every other day and usually eases spontaneously but he did take a nitroglycerin on Tuesday. He says that he has 2 or 3 cocktails a night before he has guaranteed to get the chest pain in the morning. When he doesn't drink its not as frequent. He does walk and pushes his lawnmower without any symptoms. All his symptoms are in the early morning. He would like to continue medical therapy rather than going back for cardiac catheterization at this point.  No Known Allergies  Current Outpatient Prescriptions on File Prior to Visit: aspirin EC 81 MG tablet, Take 81 mg by mouth daily., Disp: , Rfl:  atorvastatin (LIPITOR) 80 MG tablet, TAKE 1 TABLET (80 MG TOTAL) BY MOUTH DAILY., Disp: 30 tablet, Rfl: 5 carvedilol (COREG) 3.125 MG tablet, Take 2 tablets (6.25 mg total) by mouth 2 (two) times daily with a meal., Disp: 120 tablet, Rfl: 3 eszopiclone (LUNESTA) 2 MG TABS, Take 2 mg by mouth at bedtime. Take immediately before bedtime, Disp: , Rfl:  isosorbide mononitrate (IMDUR) 30 MG 24 hr tablet, TAKE 1 TABLET (30 MG TOTAL) BY MOUTH DAILY., Disp: 30 tablet, Rfl: 5 lisinopril (PRINIVIL,ZESTRIL) 5 MG tablet, Take 1 tablet (5 mg total) by mouth daily., Disp: 90 tablet, Rfl: 3 Multiple Vitamins-Minerals (CENTRUM SILVER PO), Take 1 tablet by mouth daily. , Disp: , Rfl:  nitroGLYCERIN (NITROSTAT) 0.4 MG SL tablet, Place 1 tablet (0.4 mg total) under the  tongue every 5 (five) minutes as needed., Disp: 25 tablet, Rfl: 6 omeprazole (PRILOSEC) 20 MG capsule, Take 1 capsule (20 mg total) by mouth daily., Disp: 90 capsule, Rfl: 3 venlafaxine XR (EFFEXOR-XR) 75 MG 24 hr capsule, TAKE ONE TABLET BY MOUTH DAILY FOR DEPRESSION, Disp: 90 capsule, Rfl: 0  No current facility-administered medications on file prior to visit.   Past Medical History:   CAD (coronary artery disease)                                  Comment:stent to the RCA in 2001. Negative Myoview in               January 2012   HTN (hypertension)                                           HLD (hyperlipidemia)                                         Depression  Vertigo                                                      OSA (obstructive sleep apnea)                               Past Surgical History:   CORONARY STENT PLACEMENT                         2001           Comment: RCA   TONSILLECTOMY                                                Review of patient's family history indicates:   Cirrhosis                      Mother                   Heart disease                  Father                   Hypertension                   Father                   Peripheral vascular disease    Father                   Social History   Marital Status: Legally Separated   Spouse Name:                      Years of Education:                 Number of children:             Occupational History   None on file  Social History Main Topics   Smoking Status: Former Smoker                   Packs/Day: 0.00  Years:           Quit date: 01/14/2000   Smokeless Status: Not on file                      Alcohol Use: No             Drug Use: No             Sexual Activity: Not Currently      Other Topics            Concern   None on file  Social History Narrative   None on file    ROS:see history of present illness otherwise  negative   PHYSICAL EXAM: Well-nournished, in no acute distress. Neck: No JVD, HJR, Bruit, or thyroid enlargement  Lungs: No tachypnea, clear without wheezing, rales, or rhonchi  Cardiovascular: RRR, PMI not displaced, heart sounds normal, no murmurs, gallops, bruit, thrill,  or heave.  Abdomen: BS normal. Soft without organomegaly, masses, lesions or tenderness.  Extremities: without cyanosis, clubbing or edema. Good distal pulses bilateral  SKin: Warm, no lesions or rashes   Musculoskeletal: No deformities  Neuro: no focal signs  BP 122/76  Pulse 57  Ht 5\' 6"  (1.676 m)  Wt 185 lb (83.915 kg)  BMI 29.87 kg/m2   ZOX:WRUEA bradycardia 57 beats per minute otherwise normal   Coronary angiography:    Left mainstem: The left main is very short with essentially a shared ostium. No significant stenosis.   Left anterior descending (LAD): The LAD is severely calcified in the proximal vessel. There is diffuse 30% disease in the proximal LAD. In the mid vessel the LAD gives rise to a first septal perforator, first diagonal, and then immediately a second diagonal branch. The LAD in this segment has a diffuse 40-50% stenosis. The first diagonal branch has 40-50% disease proximally. The second diagonal branch has a focal 80-90% stenosis. This vessel is moderate in size and moderately calcified.   Left circumflex (LCx): The left circumflex gives rise to a small first obtuse marginal vessel before terminating on the lateral wall is a second marginal branch. There is 30% disease proximally. The remainder the vessel has scattered irregularities.   Right coronary artery (RCA): The right coronary is a large dominant vessel. In the mid vessel there is a segmental 30% stenosis which is heavily calcified. There is a stent noted in the distal vessel which is widely patent with diffuse 20-30% disease.   Left ventriculography: Left ventricular systolic function is normal, LVEF is estimated at 55-65%, there  is no significant mitral regurgitation   Final Conclusions:   1. Single vessel obstructive coronary disease involving the second diagonal branch. The stent in the distal RCA is widely patent.   2. Normal left ventricular function.   Recommendations: I would recommend initial medical therapy. We will increase his carvedilol to 6.25 mg twice a day. We will start him on Imdur 30 mg daily. We will switch him from pravastatin to Lipitor 80 mg daily. We'll follow him closely. If he has refractory angina despite good medical therapy we could consider PCI of the second diagonal branch.   [facility]   02/13/2012, 9:22 AM

## 2012-05-12 NOTE — Assessment & Plan Note (Signed)
Patient continues to have her early morning angina about every other day, worse if he has 2-3 cocktails the night before. Have asked him to cut back on his alcohol intake. Have also increased his Imdur to 60 mg daily and asked him to take it at night to see if that takes a difference. Will followup with Dr.Nishan in one month.

## 2012-06-01 ENCOUNTER — Ambulatory Visit: Payer: Medicare Other | Admitting: Cardiovascular Disease

## 2012-06-09 ENCOUNTER — Ambulatory Visit (INDEPENDENT_AMBULATORY_CARE_PROVIDER_SITE_OTHER): Payer: Medicare Other | Admitting: Cardiovascular Disease

## 2012-06-09 ENCOUNTER — Ambulatory Visit: Payer: Medicare Other | Admitting: Cardiovascular Disease

## 2012-06-09 ENCOUNTER — Encounter: Payer: Self-pay | Admitting: Cardiovascular Disease

## 2012-06-09 VITALS — BP 100/70 | HR 80 | Ht 66.0 in | Wt 182.0 lb

## 2012-06-09 DIAGNOSIS — I2510 Atherosclerotic heart disease of native coronary artery without angina pectoris: Secondary | ICD-10-CM

## 2012-06-09 DIAGNOSIS — I10 Essential (primary) hypertension: Secondary | ICD-10-CM

## 2012-06-09 DIAGNOSIS — E785 Hyperlipidemia, unspecified: Secondary | ICD-10-CM

## 2012-06-09 NOTE — Assessment & Plan Note (Signed)
Cholesterol is at goal.  Continue current dose of statin and diet Rx.  No myalgias or side effects.  F/U  LFT's in 6 months. Lab Results  Component Value Date   LDLCALC 91 07/07/2007             

## 2012-06-09 NOTE — Progress Notes (Signed)
Patient ID: Roy [name], male   DOB: 1945/11/16, 67 y.o.   MRN: [id number] This is a 67 year old male with  history coronary artery disease status post remote stent to the RCA back in 2001.  He recently had a cardiac catheterization in January 2014 that showed single vessel obstructive disease involving the second diagonal branch. The stent in the distal RCA was patent. Medical management was advised. Coreg and Imdur were added. PCI would be considered if he had refractory symptoms.  The patient is having chest tightness radiating to his right elbow in the early morning when he first gets up. He mows the lawn and is active with no chest pain 5 episodes in May all resting Nitro takes about 10 minutes to help He says that he has 2 or 3 cocktails a night before he has guaranteed to get the chest pain in the morning. When he doesn't drink its not as frequent. He does walk and pushes his lawnmower without any symptoms. All his symptoms are in the early morning. He would like to continue medical therapy rather than going back for cardiac catheterization at this point.  ROS: Denies fever, malais, weight loss, blurry vision, decreased visual acuity, cough, sputum, SOB, hemoptysis, pleuritic pain, palpitaitons, heartburn, abdominal pain, melena, lower extremity edema, claudication, or rash.  All other systems reviewed and negative  General: Affect appropriate Healthy:  appears stated age HEENT: normal Neck supple with no adenopathy JVP normal no bruits no thyromegaly Lungs clear with no wheezing and good diaphragmatic motion Heart:  S1/S2 no murmur, no rub, gallop or click PMI normal Abdomen: benighn, BS positve, no tenderness, no AAA no bruit.  No HSM or HJR Distal pulses intact with no bruits No edema Neuro non-focal Skin warm and dry No muscular weakness   Current Outpatient Prescriptions  Medication Sig Dispense Refill  . aspirin EC 81 MG tablet Take 81 mg by mouth daily.      Marland Kitchen  atorvastatin (LIPITOR) 80 MG tablet TAKE 1 TABLET (80 MG TOTAL) BY MOUTH DAILY.  30 tablet  5  . carvedilol (COREG) 6.25 MG tablet Take 1 tablet (6.25 mg total) by mouth 2 (two) times daily with a meal.  120 tablet  3  . eszopiclone (LUNESTA) 2 MG TABS Take 2 mg by mouth at bedtime. Take immediately before bedtime      . isosorbide mononitrate (IMDUR) 60 MG 24 hr tablet Take 1 tablet (60 mg total) by mouth at bedtime.  90 tablet  3  . lisinopril (PRINIVIL,ZESTRIL) 5 MG tablet Take 1 tablet (5 mg total) by mouth daily.  90 tablet  3  . Multiple Vitamins-Minerals (CENTRUM SILVER PO) Take 1 tablet by mouth daily.       . nitroGLYCERIN (NITROSTAT) 0.4 MG SL tablet Place 1 tablet (0.4 mg total) under the tongue every 5 (five) minutes as needed.  25 tablet  6  . omeprazole (PRILOSEC) 20 MG capsule Take 1 capsule (20 mg total) by mouth daily.  90 capsule  3  . venlafaxine XR (EFFEXOR-XR) 75 MG 24 hr capsule TAKE ONE TABLET BY MOUTH DAILY FOR DEPRESSION  90 capsule  0   No current facility-administered medications for this visit.    Allergies  Review of patient's allergies indicates no known allergies.  Electrocardiogram:  4/30  SR rate 57 normal  Assessment and Plan

## 2012-06-09 NOTE — Assessment & Plan Note (Signed)
Stable with no angina and good activity level.  Continue medical Rx Can add ranexa if needed.  Chest pain may not be angina as it is always resting and nitro does not help immediately Continue medical Rx

## 2012-06-09 NOTE — Assessment & Plan Note (Signed)
Well controlled.  Continue current medications and low sodium Dash type diet.    

## 2012-06-09 NOTE — Patient Instructions (Addendum)
Your physician recommends that you continue on your current medications as directed. Please refer to the Current Medication list given to you today.  Your physician wants you to follow-up in: 6 months. You will receive a reminder letter in the mail two months in advance. If you don't receive a letter, please call our office to schedule the follow-up appointment.  

## 2012-06-24 ENCOUNTER — Other Ambulatory Visit: Payer: Self-pay | Admitting: Internal Medicine

## 2012-06-24 ENCOUNTER — Telehealth: Payer: Self-pay | Admitting: Internal Medicine

## 2012-06-24 NOTE — Telephone Encounter (Signed)
kevery other day for a week, every third day for a week and then stop

## 2012-06-24 NOTE — Telephone Encounter (Signed)
Pt called wondering if he could stop taking Venlafaxine 75 mg. Offer the first appt open. Pt stated he does not think he need this med anymore and he is healthy, no need an appt. Last ov was 2012. Please advise.

## 2012-06-24 NOTE — Telephone Encounter (Signed)
If he chooses to stop effexor he should taper off . No new Rx from me without an office visit. After three years his record is closed

## 2012-06-24 NOTE — Telephone Encounter (Signed)
Pt notified and will call back to schedule an appt. He states he does not have enough pills.

## 2012-06-24 NOTE — Telephone Encounter (Signed)
Pt notified. He asks how should he taper off the medication? One every other day or what? Please advise. thanks

## 2012-08-02 ENCOUNTER — Encounter: Payer: Self-pay | Admitting: Nurse Practitioner

## 2012-08-02 ENCOUNTER — Telehealth: Payer: Self-pay | Admitting: Nurse Practitioner

## 2012-08-02 NOTE — Telephone Encounter (Signed)
Needs to cut back his ETOH  Would not start ranexa at this time f/u with me next available

## 2012-08-02 NOTE — Telephone Encounter (Signed)
Follow up  ° ° ° ° °Pt is returning your call  °

## 2012-08-02 NOTE — Telephone Encounter (Addendum)
Left message for call back. Spoke with patient in detail (see phone note).

## 2012-08-02 NOTE — Telephone Encounter (Signed)
Patient aware of MD recommendations. Made appointment for patient to see Dr.Nishan on 8/15 at 1145am.

## 2012-08-02 NOTE — Telephone Encounter (Signed)
Spoke with patient concerning his chest pain over the weekend. He had chest and bilateral arm pains on Saturday. He took a total of 3 NTG throughout the day with relief of pain. On Sunday he had one episode of chest and bilateral arm pain relieved with NTG. Sometimes the episodes are with activity and some with rest. Dose not smoke but had 2 vodka drinks on Saturday and 1 on Sunday. Advised will discuss with Dr.Nishan about medical therapy/possible Ranexa use noted in last office visit note.

## 2012-08-14 ENCOUNTER — Encounter: Payer: Self-pay | Admitting: Internal Medicine

## 2012-08-16 MED ORDER — ALPRAZOLAM 0.25 MG PO TABS: 0.2500 mg | ORAL_TABLET | Freq: Four times a day (QID) | ORAL | Status: DC | PRN

## 2012-08-18 ENCOUNTER — Encounter: Payer: Self-pay | Admitting: Internal Medicine

## 2012-08-18 ENCOUNTER — Other Ambulatory Visit: Payer: Self-pay

## 2012-08-25 ENCOUNTER — Other Ambulatory Visit: Payer: Self-pay | Admitting: Nurse Practitioner

## 2012-08-27 ENCOUNTER — Ambulatory Visit (INDEPENDENT_AMBULATORY_CARE_PROVIDER_SITE_OTHER): Payer: Medicare Other | Admitting: Cardiovascular Disease

## 2012-08-27 ENCOUNTER — Encounter: Payer: Self-pay | Admitting: Cardiovascular Disease

## 2012-08-27 VITALS — BP 120/88 | HR 88 | Ht 66.0 in | Wt 183.0 lb

## 2012-08-27 DIAGNOSIS — I2510 Atherosclerotic heart disease of native coronary artery without angina pectoris: Secondary | ICD-10-CM

## 2012-08-27 DIAGNOSIS — E785 Hyperlipidemia, unspecified: Secondary | ICD-10-CM

## 2012-08-27 DIAGNOSIS — I10 Essential (primary) hypertension: Secondary | ICD-10-CM

## 2012-08-27 DIAGNOSIS — R079 Chest pain, unspecified: Secondary | ICD-10-CM

## 2012-08-27 MED ORDER — CARVEDILOL 6.25 MG PO TABS: ORAL_TABLET | ORAL | Status: DC

## 2012-08-27 MED ORDER — CLOPIDOGREL BISULFATE 75 MG PO TABS: 75.0000 mg | ORAL_TABLET | Freq: Every day | ORAL | Status: DC

## 2012-08-27 NOTE — Assessment & Plan Note (Signed)
Stable angina Add plavix  Increase coreg to 12.5 am and 6.25 pm  F/U in 6 months Patient prefers continued medical Rx "There is nothing I want to do that I cant"

## 2012-08-27 NOTE — Assessment & Plan Note (Signed)
Well controlled.  Continue current medications and low sodium Dash type diet.    

## 2012-08-27 NOTE — Assessment & Plan Note (Signed)
Cholesterol is at goal.  Continue current dose of statin and diet Rx.  No myalgias or side effects.  F/U  LFT's in 6 months. Lab Results  Component Value Date   LDLCALC 91 07/07/2007             

## 2012-08-27 NOTE — Addendum Note (Signed)
Addended by: Scherrie Bateman E on: 08/27/2012 12:20 PM   Modules accepted: Orders

## 2012-08-27 NOTE — Patient Instructions (Addendum)
Your physician wants you to follow-up in:   6 MONTHS WITH DR Haywood Filler will receive a reminder letter in the mail two months in advance. If you don't receive a letter, please call our office to schedule the follow-up appointment. Your physician has recommended you make the following change in your medication:  CARVEDILOL  12.5 MG  EVERY AM  AND 6.25 MG EVERY PM  PLAVIX  75 MG  1 DAILY

## 2012-08-27 NOTE — Progress Notes (Signed)
Patient ID: Roy [name], male   DOB: [birth year], 67 y.o.   MRN: [id number] This is a 67 year old male with history coronary artery disease status post remote stent to the RCA back in 2001.  He recently had a cardiac catheterization in January 2014 that showed single vessel obstructive disease involving the second diagonal branch. The stent in the distal RCA was patent. Medical management was advised. Coreg and Imdur were added. PCI would be considered if he had refractory symptoms.  The patient is having chest tightness radiating to his right elbow in the early morning when he first gets up. He mows the lawn and is active with no chest pain  5 episodes in May all resting Nitro takes about 10 minutes to help He says that he has 2 or 3 cocktails a night before he has guaranteed to get the chest pain in the morning. When he doesn't drink its not as frequent. He does walk and pushes his lawnmower without any symptoms.   Not getting pain weekly  Maybe a few episodes every 3 weeks.  Compliant with meds   Reviewed cath films with patient.  Big dominant RCA with patent distal stent. Lots of calcium in LM and proximal LAD.  D1 is long but small caliber and likely would need a 2.0 stent Native mid and distal LAD also small caliber  ROS: Denies fever, malais, weight loss, blurry vision, decreased visual acuity, cough, sputum, SOB, hemoptysis, pleuritic pain, palpitaitons, heartburn, abdominal pain, melena, lower extremity edema, claudication, or rash.  All other systems reviewed and negative  General: Affect appropriate Healthy:  appears stated age HEENT: normal Neck supple with no adenopathy JVP normal no bruits no thyromegaly Lungs clear with no wheezing and good diaphragmatic motion Heart:  S1/S2 no murmur, no rub, gallop or click PMI normal Abdomen: benighn, BS positve, no tenderness, no AAA no bruit.  No HSM or HJR Distal pulses intact with no bruits No edema Neuro non-focal Skin warm and  dry No muscular weakness   Current Outpatient Prescriptions  Medication Sig Dispense Refill  . ALPRAZolam (XANAX) 0.25 MG tablet Take 1 tablet (0.25 mg total) by mouth every 6 (six) hours as needed for sleep.  60 tablet  2  . aspirin EC 81 MG tablet Take 81 mg by mouth daily.      Marland Kitchen atorvastatin (LIPITOR) 80 MG tablet TAKE 1 TABLET (80 MG TOTAL) BY MOUTH DAILY.  30 tablet  5  . carvedilol (COREG) 6.25 MG tablet Take 1 tablet (6.25 mg total) by mouth 2 (two) times daily with a meal.  120 tablet  3  . eszopiclone (LUNESTA) 2 MG TABS Take 2 mg by mouth at bedtime. Take immediately before bedtime      . isosorbide mononitrate (IMDUR) 60 MG 24 hr tablet Take 1 tablet (60 mg total) by mouth at bedtime.  90 tablet  3  . lisinopril (PRINIVIL,ZESTRIL) 5 MG tablet Take 1 tablet (5 mg total) by mouth daily.  90 tablet  3  . Multiple Vitamins-Minerals (CENTRUM SILVER PO) Take 1 tablet by mouth daily.       . nitroGLYCERIN (NITROSTAT) 0.4 MG SL tablet Place 1 tablet (0.4 mg total) under the tongue every 5 (five) minutes as needed.  25 tablet  6  . omeprazole (PRILOSEC) 20 MG capsule TAKE ONE CAPSULE BY MOUTH EVERY DAY  90 capsule  3  . venlafaxine XR (EFFEXOR-XR) 75 MG 24 hr capsule TAKE ONE TABLET BY MOUTH DAILY FOR DEPRESSION  90 capsule  0   No current facility-administered medications for this visit.    Allergies  Review of patient's allergies indicates no known allergies.  Electrocardiogram:  04/15/12  SR rate 57 normalECG  Assessment and Plan

## 2012-09-06 ENCOUNTER — Other Ambulatory Visit: Payer: Self-pay | Admitting: Nurse Practitioner

## 2012-10-05 ENCOUNTER — Encounter: Payer: Self-pay | Admitting: Internal Medicine

## 2012-10-05 MED ORDER — VENLAFAXINE HCL ER 75 MG PO CP24: 75.0000 mg | ORAL_CAPSULE | Freq: Every day | ORAL | Status: DC

## 2012-11-02 ENCOUNTER — Other Ambulatory Visit: Payer: Self-pay

## 2012-11-02 MED ORDER — ATORVASTATIN CALCIUM 80 MG PO TABS: ORAL_TABLET | ORAL | Status: DC

## 2012-11-02 MED ORDER — VENLAFAXINE HCL ER 75 MG PO CP24: 75.0000 mg | ORAL_CAPSULE | Freq: Every day | ORAL | Status: DC

## 2012-11-18 ENCOUNTER — Other Ambulatory Visit: Payer: Self-pay

## 2013-02-07 ENCOUNTER — Encounter: Payer: Self-pay | Admitting: Cardiovascular Disease

## 2013-02-07 ENCOUNTER — Ambulatory Visit (INDEPENDENT_AMBULATORY_CARE_PROVIDER_SITE_OTHER): Payer: Medicare Other | Admitting: Cardiovascular Disease

## 2013-02-07 VITALS — BP 114/68 | HR 90 | Wt 189.0 lb

## 2013-02-07 DIAGNOSIS — R079 Chest pain, unspecified: Secondary | ICD-10-CM

## 2013-02-07 DIAGNOSIS — L97909 Non-pressure chronic ulcer of unspecified part of unspecified lower leg with unspecified severity: Secondary | ICD-10-CM

## 2013-02-07 DIAGNOSIS — L97929 Non-pressure chronic ulcer of unspecified part of left lower leg with unspecified severity: Secondary | ICD-10-CM

## 2013-02-07 MED ORDER — NITROGLYCERIN 0.4 MG SL SUBL: 0.4000 mg | SUBLINGUAL_TABLET | SUBLINGUAL | Status: DC | PRN

## 2013-02-07 MED ORDER — CARVEDILOL 12.5 MG PO TABS: ORAL_TABLET | ORAL | Status: DC

## 2013-02-07 NOTE — Progress Notes (Signed)
Patient ID: Roy [name], male   DOB: [birth year], 68 y.o.   MRN: [id number] This is a 68 year old male with history coronary artery disease status post remote stent to the RCA back in 2001.  He recently had a cardiac catheterization in January 2014 that showed single vessel obstructive disease involving the second diagonal branch. The stent in the distal RCA was patent. Medical management was advised. Coreg and Imdur were added. PCI would be considered if he had refractory symptoms.  The patient is having chest tightness radiating to his right elbow in the early morning when he first gets up. He mows the lawn and is active with no chest pain  5 episodes in May all resting Nitro takes about 10 minutes to help He says that he has 2 or 3 cocktails a night before he has guaranteed to get the chest pain in the morning. When he doesn't drink its not as frequent. He does walk and pushes his lawnmower without any symptoms.  Not getting pain weekly Maybe a few episodes every 3 weeks. Compliant with meds  Reviewed cath films with patient. Big dominant RCA with patent distal stent. Lots of calcium in LM and proximal LAD. D1 is long but small caliber and likely would need a 2.0 stent  Native mid and distal LAD also small caliber  Last visit increased coreg and plavix added   HR high today taking imdur at night Has a spot on his left shin that has not healed from excision by Dr Tonia Brooms 4 years ago     ROS: Denies fever, malais, weight loss, blurry vision, decreased visual acuity, cough, sputum, SOB, hemoptysis, pleuritic pain, palpitaitons, heartburn, abdominal pain, melena, lower extremity edema, claudication, or rash.  All other systems reviewed and negative  General: Affect appropriate Healthy:  appears stated age 89: normal Neck supple with no adenopathy JVP normal no bruits no thyromegaly Lungs clear with no wheezing and good diaphragmatic motion Heart:  S1/S2 no murmur, no rub, gallop or  click PMI normal Abdomen: benighn, BS positve, no tenderness, no AAA no bruit.  No HSM or HJR Distal pulses intact with no bruits No edema  Scabbed over lesion left shin  Neuro non-focal Skin warm and dry No muscular weakness   Current Outpatient Prescriptions  Medication Sig Dispense Refill  . ALPRAZolam (XANAX) 0.25 MG tablet Take 0.25 mg by mouth at bedtime as needed for sleep.      Marland Kitchen aspirin EC 81 MG tablet Take 81 mg by mouth daily.      Marland Kitchen atorvastatin (LIPITOR) 80 MG tablet TAKE 1 TABLET (80 MG TOTAL) BY MOUTH DAILY.  30 tablet  5  . carvedilol (COREG) 6.25 MG tablet TAKE  12.5 MG EVERY AM   AND  6.25 MG EVERY  EVENING  270 tablet  3  . clopidogrel (PLAVIX) 75 MG tablet Take 1 tablet (75 mg total) by mouth daily.  90 tablet  3  . eszopiclone (LUNESTA) 2 MG TABS Take 2 mg by mouth as needed. Take immediately before bedtime      . isosorbide mononitrate (IMDUR) 60 MG 24 hr tablet Take 1 tablet (60 mg total) by mouth at bedtime.  90 tablet  3  . lisinopril (PRINIVIL,ZESTRIL) 5 MG tablet TAKE 1 TABLET BY MOUTH DAILY  90 tablet  3  . Multiple Vitamins-Minerals (CENTRUM SILVER PO) Take 1 tablet by mouth daily.       . nitroGLYCERIN (NITROSTAT) 0.4 MG SL tablet Place 1 tablet (0.4 mg  total) under the tongue every 5 (five) minutes as needed.  25 tablet  6  . omeprazole (PRILOSEC) 20 MG capsule TAKE ONE CAPSULE BY MOUTH EVERY DAY  90 capsule  3  . venlafaxine XR (EFFEXOR XR) 75 MG 24 hr capsule Take 1 capsule (75 mg total) by mouth daily.  90 capsule  3   No current facility-administered medications for this visit.    Allergies  Review of patient's allergies indicates no known allergies.  Electrocardiogram:  05/12/12  SR rate 56 normal   Assessment and Plan

## 2013-02-07 NOTE — Assessment & Plan Note (Signed)
Cholesterol is at goal.  Continue current dose of statin and diet Rx.  No myalgias or side effects.  F/U  LFT's in 6 months. Lab Results  Component Value Date   LDLCALC 91 07/07/2007

## 2013-02-07 NOTE — Assessment & Plan Note (Signed)
Not clear why excision has not healed Concern for CA  Refer to [facility] dermatology  He is going to [place] and would like to see them after his return

## 2013-02-07 NOTE — Assessment & Plan Note (Signed)
Well controlled.  Continue current medications and low sodium Dash type diet.    

## 2013-02-07 NOTE — Assessment & Plan Note (Signed)
Increase coreg to 12.5 bid  Nitro called in stable anginal pattern  Can add Ranexa in future if needed

## 2013-02-07 NOTE — Patient Instructions (Addendum)
Your physician wants you to follow-up in:   [place] will receive a reminder letter in the mail two months in advance. If you don't receive a letter, please call our office to schedule the follow-up appointment. Your physician has recommended you make the following change in your medication: INCREASE CARVEDILOL TO 12.5 MG  TWICE DAILY  [place]  DERMATOLOGY   [phone] [place] [address]

## 2013-03-04 ENCOUNTER — Ambulatory Visit: Payer: Medicare Other | Admitting: Cardiovascular Disease

## 2013-03-28 ENCOUNTER — Other Ambulatory Visit: Payer: Self-pay

## 2013-03-28 MED ORDER — ISOSORBIDE MONONITRATE ER 60 MG PO TB24: 60.0000 mg | ORAL_TABLET | Freq: Every day | ORAL | Status: DC

## 2013-04-05 ENCOUNTER — Encounter: Payer: Self-pay | Admitting: Internal Medicine

## 2013-04-11 ENCOUNTER — Telehealth: Payer: Self-pay | Admitting: Cardiovascular Disease

## 2013-04-11 NOTE — Telephone Encounter (Signed)
Received request from Nurse fax box, documents faxed for surgical clearance. To: [facility] Fax number: [phone] Attention: [phone]

## 2013-04-27 ENCOUNTER — Other Ambulatory Visit: Payer: Self-pay | Admitting: Nurse Practitioner

## 2013-05-10 ENCOUNTER — Other Ambulatory Visit: Payer: Self-pay

## 2013-05-10 MED ORDER — ATORVASTATIN CALCIUM 80 MG PO TABS: ORAL_TABLET | ORAL | Status: DC

## 2013-08-05 ENCOUNTER — Encounter: Payer: Self-pay | Admitting: Cardiovascular Disease

## 2013-08-07 ENCOUNTER — Other Ambulatory Visit: Payer: Self-pay

## 2013-08-07 MED ORDER — ATORVASTATIN CALCIUM 80 MG PO TABS: ORAL_TABLET | ORAL | Status: DC

## 2013-08-31 ENCOUNTER — Encounter: Payer: Self-pay | Admitting: Cardiovascular Disease

## 2013-09-01 ENCOUNTER — Other Ambulatory Visit: Payer: Self-pay | Admitting: *Deleted

## 2013-09-01 MED ORDER — CLOPIDOGREL BISULFATE 75 MG PO TABS: 75.0000 mg | ORAL_TABLET | Freq: Every day | ORAL | Status: DC

## 2013-09-02 ENCOUNTER — Other Ambulatory Visit: Payer: Self-pay | Admitting: *Deleted

## 2013-09-02 MED ORDER — CARVEDILOL 12.5 MG PO TABS: ORAL_TABLET | ORAL | Status: DC

## 2013-09-13 ENCOUNTER — Other Ambulatory Visit: Payer: Self-pay

## 2013-09-13 ENCOUNTER — Encounter: Payer: Self-pay | Admitting: Cardiovascular Disease

## 2013-09-13 MED ORDER — OMEPRAZOLE 20 MG PO CPDR: DELAYED_RELEASE_CAPSULE | ORAL | Status: DC

## 2013-09-13 MED ORDER — LISINOPRIL 5 MG PO TABS: ORAL_TABLET | ORAL | Status: DC

## 2013-10-10 ENCOUNTER — Other Ambulatory Visit: Payer: Self-pay | Admitting: Cardiovascular Disease

## 2013-10-11 ENCOUNTER — Other Ambulatory Visit: Payer: Self-pay

## 2013-10-11 MED ORDER — CARVEDILOL 12.5 MG PO TABS: ORAL_TABLET | ORAL | Status: DC

## 2013-11-04 ENCOUNTER — Other Ambulatory Visit: Payer: Self-pay | Admitting: Cardiovascular Disease

## 2013-11-07 ENCOUNTER — Other Ambulatory Visit: Payer: Self-pay

## 2013-12-04 ENCOUNTER — Other Ambulatory Visit: Payer: Self-pay | Admitting: Cardiovascular Disease

## 2013-12-06 ENCOUNTER — Other Ambulatory Visit: Payer: Self-pay | Admitting: Cardiovascular Disease

## 2013-12-14 ENCOUNTER — Ambulatory Visit (INDEPENDENT_AMBULATORY_CARE_PROVIDER_SITE_OTHER): Payer: Medicare Other | Admitting: Cardiovascular Disease

## 2013-12-14 ENCOUNTER — Encounter: Payer: Self-pay | Admitting: Cardiovascular Disease

## 2013-12-14 VITALS — BP 120/82 | HR 94 | Ht 66.0 in | Wt 188.0 lb

## 2013-12-14 DIAGNOSIS — I25118 Atherosclerotic heart disease of native coronary artery with other forms of angina pectoris: Secondary | ICD-10-CM

## 2013-12-14 DIAGNOSIS — E785 Hyperlipidemia, unspecified: Secondary | ICD-10-CM

## 2013-12-14 DIAGNOSIS — I10 Essential (primary) hypertension: Secondary | ICD-10-CM

## 2013-12-14 NOTE — Assessment & Plan Note (Signed)
Well controlled.  Continue current medications and low sodium Dash type diet.    

## 2013-12-14 NOTE — Assessment & Plan Note (Signed)
Stable with no angina and good activity level.  Continue medical Rx D1 not ideal for PCI  F/u stress nuclear study in March to assess ischemic burden Stable pattern no need for Ranexa at this time

## 2013-12-14 NOTE — Progress Notes (Signed)
Patient ID: Roy [name], male   DOB: 05-24-1945, 68 y.o.   MRN: [id number] This is a 68 year old male with history coronary artery disease status post remote stent to the RCA back in 2001.  He y had a cardiac catheterization in January 2014 that showed single vessel obstructive disease involving the second diagonal branch. The stent in the distal RCA was patent. Medical management was advised. Coreg and Imdur were added. PCI would be considered if he had refractory symptoms.  The patient is having chest tightness radiating to his right elbow in the early morning when he first gets up. He mows the lawn and is active with no chest pain  5 episodes in May all resting Nitro takes about 10 minutes to help He says that he has 2 or 3 cocktails a night before he has guaranteed to get the chest pain in the morning. When he doesn't drink its not as frequent. He does walk and pushes his lawnmower without any symptoms.  Not getting pain weekly Maybe a few episodes every 3 weeks. Compliant with meds   Reviewed cath films with patient. Big dominant RCA with patent distal stent. Lots of calcium in LM and proximal LAD. D1 is long but small caliber and likely would need a 2.0 stent  Native mid and distal LAD also small caliber  Last visit increased coreg and plavix added HR high today taking imdur at night Chest pain pattern stable Going to [place] February Compliant with meds.  He seems to notice connection between [place] intake and chest pain next day  On omeprozole already      ROS: Denies fever, malais, weight loss, blurry vision, decreased visual acuity, cough, sputum, SOB, hemoptysis, pleuritic pain, palpitaitons, heartburn, abdominal pain, melena, lower extremity edema, claudication, or rash.  All other systems reviewed and negative  General: Affect appropriate Healthy:  appears stated age 73: normal Neck supple with no adenopathy JVP normal no bruits no thyromegaly Lungs clear  with no wheezing and good diaphragmatic motion Heart:  S1/S2 no murmur, no rub, gallop or click PMI normal Abdomen: benighn, BS positve, no tenderness, no AAA no bruit.  No HSM or HJR Distal pulses intact with no bruits Plus one bilateral edema with venous insuf.  Neuro non-focal Skin warm and dry No muscular weakness   Current Outpatient Prescriptions  Medication Sig Dispense Refill  . aspirin EC 81 MG tablet Take 81 mg by mouth daily.    Marland Kitchen atorvastatin (LIPITOR) 80 MG tablet TAKE 1 TABLET (80 MG TOTAL) BY MOUTH DAILY. 90 tablet 0  . carvedilol (COREG) 12.5 MG tablet TAKE 1 TABLET BY MOUTH TWICE DAILY 60 tablet 2  . clopidogrel (PLAVIX) 75 MG tablet Take 1 tablet (75 mg total) by mouth daily. 90 tablet 3  . isosorbide mononitrate (IMDUR) 60 MG 24 hr tablet Take 1 tablet (60 mg total) by mouth at bedtime. 90 tablet 3  . lisinopril (PRINIVIL,ZESTRIL) 5 MG tablet TAKE 1 TABLET BY MOUTH DAILY 90 tablet 1  . Multiple Vitamins-Minerals (CENTRUM SILVER PO) Take 1 tablet by mouth daily.     Marland Kitchen omeprazole (PRILOSEC) 20 MG capsule TAKE ONE CAPSULE BY MOUTH EVERY DAY 90 capsule 1  . venlafaxine XR (EFFEXOR XR) 75 MG 24 hr capsule Take 1 capsule (75 mg total) by mouth daily. 90 capsule 3  . ALPRAZolam (XANAX) 0.25 MG tablet Take 0.25 mg by mouth at bedtime as needed for sleep.    . eszopiclone (LUNESTA) 2 MG TABS Take 2  mg by mouth as needed. Take immediately before bedtime    . nitroGLYCERIN (NITROSTAT) 0.4 MG SL tablet Place 1 tablet (0.4 mg total) under the tongue every 5 (five) minutes as needed. (Patient not taking: Reported on 12/14/2013) 25 tablet 6   No current facility-administered medications for this visit.    Allergies  Review of patient's allergies indicates no known allergies.  Electrocardiogram:  SR rate 94  Normal slightly poor R wave progression   Assessment and Plan

## 2013-12-14 NOTE — Assessment & Plan Note (Signed)
Cholesterol is at goal.  Continue current dose of statin and diet Rx.  No myalgias or side effects.  F/U  LFT's in 6 months. Lab Results  Component Value Date   LDLCALC 91 07/07/2007

## 2013-12-14 NOTE — Patient Instructions (Signed)
Your physician recommends that you schedule a follow-up appointment in:  3 MONTHS WITH  DR NISHAN  Your physician recommends that you continue on your current medications as directed. Please refer to the Current Medication list given to you today.  

## 2013-12-22 ENCOUNTER — Encounter (HOSPITAL_COMMUNITY): Payer: Self-pay | Admitting: Cardiology

## 2014-01-15 ENCOUNTER — Encounter: Payer: Self-pay | Admitting: Cardiovascular Disease

## 2014-01-15 ENCOUNTER — Other Ambulatory Visit: Payer: Self-pay | Admitting: Cardiovascular Disease

## 2014-01-16 ENCOUNTER — Other Ambulatory Visit: Payer: Self-pay

## 2014-01-16 NOTE — Telephone Encounter (Signed)
Roy [name] from [place] left v/m; pt has new pt appt 02/09/14 with Dr Diona Browner; pt had been seeing Dr Linda Hedges but since his retirement Dr Linda Hedges office will not refill venlafaxine ER 75mg ; pt is out of venlafaxine and [facility] request # [phone] refill until pt can be seen 02/09/2014.Please advise.

## 2014-01-17 ENCOUNTER — Other Ambulatory Visit: Payer: Self-pay | Admitting: *Deleted

## 2014-01-17 MED ORDER — ATORVASTATIN CALCIUM 80 MG PO TABS: ORAL_TABLET | ORAL | Status: DC

## 2014-01-17 MED ORDER — VENLAFAXINE HCL ER 75 MG PO CP24: 75.0000 mg | ORAL_CAPSULE | Freq: Every day | ORAL | Status: DC

## 2014-01-23 ENCOUNTER — Encounter: Payer: Self-pay | Admitting: Cardiovascular Disease

## 2014-02-06 ENCOUNTER — Encounter: Payer: Self-pay | Admitting: Family Medicine

## 2014-02-09 ENCOUNTER — Ambulatory Visit: Payer: Medicare Other | Admitting: Family Medicine

## 2014-02-14 ENCOUNTER — Telehealth: Payer: Self-pay | Admitting: Family Medicine

## 2014-02-14 NOTE — Telephone Encounter (Signed)
Scheduled 03/07/14

## 2014-02-14 NOTE — Telephone Encounter (Signed)
Will refill, but pt needs appt before refills after this.

## 2014-02-14 NOTE — Telephone Encounter (Signed)
LVM on pt home phone to call and schedule new pt appt (30 min ov) in order to keep receiving med refills.

## 2014-02-14 NOTE — Telephone Encounter (Signed)
Patient is transferring from Dr. Linda Hedges.  Cancelled New Patient appointment on 02/09/2014 and has not rescheduled his appointment.  Ok to refill?

## 2014-02-14 NOTE — Telephone Encounter (Signed)
Please call and reschedule patient's appointment to establish care with Dr. Diona Browner (transfter from Dr. Linda Hedges.  She gave him a month of his medication but will need to be seen before any additional refills can be given.

## 2014-02-15 ENCOUNTER — Other Ambulatory Visit: Payer: Self-pay | Admitting: Family Medicine

## 2014-02-15 MED ORDER — VENLAFAXINE HCL ER 75 MG PO CP24
75.0000 mg | ORAL_CAPSULE | Freq: Every day | ORAL | Status: DC
Start: 2014-02-15 — End: 2014-03-07

## 2014-02-16 DIAGNOSIS — H905 Unspecified sensorineural hearing loss: Secondary | ICD-10-CM | POA: Diagnosis not present

## 2014-02-22 DIAGNOSIS — H903 Sensorineural hearing loss, bilateral: Secondary | ICD-10-CM | POA: Diagnosis not present

## 2014-02-27 ENCOUNTER — Other Ambulatory Visit: Payer: Self-pay | Admitting: Nurse Practitioner

## 2014-02-27 MED ORDER — CARVEDILOL 12.5 MG PO TABS: 12.5000 mg | ORAL_TABLET | Freq: Two times a day (BID) | ORAL | Status: DC

## 2014-03-03 ENCOUNTER — Other Ambulatory Visit: Payer: Self-pay

## 2014-03-03 MED ORDER — NITROGLYCERIN 0.4 MG SL SUBL: 0.4000 mg | SUBLINGUAL_TABLET | SUBLINGUAL | Status: DC | PRN

## 2014-03-07 ENCOUNTER — Ambulatory Visit (INDEPENDENT_AMBULATORY_CARE_PROVIDER_SITE_OTHER): Payer: Medicare Other | Admitting: Family Medicine

## 2014-03-07 ENCOUNTER — Encounter: Payer: Self-pay | Admitting: Family Medicine

## 2014-03-07 VITALS — BP 110/82 | HR 85 | Temp 98.4°F | Ht 66.0 in | Wt 184.5 lb

## 2014-03-07 DIAGNOSIS — G4733 Obstructive sleep apnea (adult) (pediatric): Secondary | ICD-10-CM

## 2014-03-07 DIAGNOSIS — F331 Major depressive disorder, recurrent, moderate: Secondary | ICD-10-CM | POA: Diagnosis not present

## 2014-03-07 DIAGNOSIS — Z125 Encounter for screening for malignant neoplasm of prostate: Secondary | ICD-10-CM

## 2014-03-07 DIAGNOSIS — E7800 Pure hypercholesterolemia, unspecified: Secondary | ICD-10-CM

## 2014-03-07 DIAGNOSIS — Z85828 Personal history of other malignant neoplasm of skin: Secondary | ICD-10-CM | POA: Diagnosis not present

## 2014-03-07 DIAGNOSIS — R339 Retention of urine, unspecified: Secondary | ICD-10-CM

## 2014-03-07 DIAGNOSIS — K219 Gastro-esophageal reflux disease without esophagitis: Secondary | ICD-10-CM | POA: Diagnosis not present

## 2014-03-07 DIAGNOSIS — R5382 Chronic fatigue, unspecified: Secondary | ICD-10-CM

## 2014-03-07 MED ORDER — VENLAFAXINE HCL ER 75 MG PO CP24: 75.0000 mg | ORAL_CAPSULE | Freq: Every day | ORAL | Status: DC

## 2014-03-07 NOTE — Progress Notes (Signed)
Subjective:    Patient ID: Roy [name], male    DOB: 07-Dec-1945, 69 y.o.   MRN: [id number]  HPI  69 year old male presents tpo establish.  previous MD was Dr. Linda Hedges.  Last OV with Dr. Linda Hedges was ? 2012  He also sees Dr. Johnsie Cancel for :coronary artery disease status post remote stent to the RCA back in 2001.  He had a cardiac catheterization in January 2014 that showed single vessel obstructive disease involving the second diagonal branch. The stent in the distal RCA was patent. Medical management was advised. Coreg and Imdur were added. PCI would be considered if he had refractory symptoms.   Last OV 01/05/14  Elevated Cholesterol: Goal with CAD < 70, per pt checked by insurance.. On lipitor 80 mg daily. Lab Results  Component Value Date   CHOL 210* 12/30/2011   HDL 52.90 12/30/2011   LDLCALC 91 07/07/2007   LDLDIRECT 115.9 12/30/2011   TRIG 291.0* 12/30/2011   CHOLHDL 4 12/30/2011  Using medications without problems: Occ leg cramp Muscle aches:  none Diet compliance:Poor Exercise: walks , but not regularly. Other complaints:  Hypertension:  On coreg and lisinopril BP Readings from Last 3 Encounters:  03/07/14 110/82  12/14/13 120/82  02/07/13 114/68   Using medication without problems or lightheadedness:  None Chest pain with exertion: occ angina uses isosorbide and nitroglycerin Edema: None Short of breath:None Average home BPs: Not checking  at home. Other issues:  Depression, major recurrent and generalize anxiety disorder:  Stable on venlafaxine. When coming off.. Symptoms returned.  Sleeping well, not needing Lunesta. Rarely using alprazolam... 2 in last 4 months.   Due for CPX. Never had colonoscopy, has not had blood test in a longtime. Sister is diabetic and top of his feet sting occ..  Review of Systems  Constitutional: Negative for fever, fatigue and unexpected weight change.       Moderate endurance, not as great as before heart issues.  HENT:  Negative for congestion, ear pain, postnasal drip, rhinorrhea, sore throat and trouble swallowing.   Eyes: Negative for pain.  Respiratory: Negative for cough, shortness of breath and wheezing.   Cardiovascular: Positive for chest pain. Negative for palpitations and leg swelling.  Gastrointestinal: Negative for nausea, abdominal pain, diarrhea, constipation and blood in stool.  Genitourinary: Negative for dysuria, urgency, hematuria, flank pain, discharge, penile swelling, scrotal swelling, difficulty urinating, penile pain and testicular pain.       Urinates 2 times a night, dribbling, feels like he does not empty completely. Occ decreased flow.  Skin: Negative for rash.  Neurological: Positive for numbness. Negative for syncope, weakness, light-headedness and headaches.  Psychiatric/Behavioral: Negative for behavioral problems and dysphoric mood. The patient is not nervous/anxious.        Objective:   Physical Exam  Constitutional: Vital signs are normal. He appears well-developed and well-nourished.  HENT:  Head: Normocephalic.  Right Ear: Hearing normal.  Left Ear: Hearing normal.  Nose: Nose normal.  Mouth/Throat: Oropharynx is clear and moist and mucous membranes are normal.  Neck: Trachea normal. Carotid bruit is not present. No thyroid mass and no thyromegaly present.  Cardiovascular: Normal rate, regular rhythm and normal pulses.  Exam reveals no gallop, no distant heart sounds and no friction rub.   No murmur heard. No peripheral edema  Pulmonary/Chest: Effort normal and breath sounds normal. No respiratory distress.  Skin: Skin is warm, dry and intact. No rash noted.  Psychiatric: He has a normal mood and affect.  His speech is normal and behavior is normal. Thought content normal.          Assessment & Plan:

## 2014-03-07 NOTE — Assessment & Plan Note (Signed)
Well controlle don venlafaxine . Rarely using alprazolam or lunesta.

## 2014-03-07 NOTE — Assessment & Plan Note (Signed)
Possible BPH. Will eval with labs (cr and PSA) and prostate exam at next OV.  Will check UA and micro at next OV as well. Consider med treatment if BPH.

## 2014-03-07 NOTE — Progress Notes (Signed)
Pre visit review using our clinic review tool, if applicable. No additional management support is needed unless otherwise documented below in the visit note. 

## 2014-03-07 NOTE — Assessment & Plan Note (Signed)
Previously well controlled on lipitor 80 mg daly per pt,. Due for re-eval.

## 2014-03-07 NOTE — Patient Instructions (Addendum)
Schedule medicare  in few months. Stop by the [place] [facility] lab few days prior for fasting labs.

## 2014-03-07 NOTE — Assessment & Plan Note (Signed)
Well controlled on prilosec.

## 2014-03-08 ENCOUNTER — Other Ambulatory Visit: Payer: Self-pay | Admitting: *Deleted

## 2014-03-08 ENCOUNTER — Encounter: Payer: Self-pay | Admitting: Cardiovascular Disease

## 2014-03-08 MED ORDER — CARVEDILOL 12.5 MG PO TABS: 12.5000 mg | ORAL_TABLET | Freq: Two times a day (BID) | ORAL | Status: DC

## 2014-03-08 NOTE — Telephone Encounter (Signed)
Patient emailed request to refill Carvedilol, as [place] and will need to have his refill activated before he goes so that he does not run out. Completed. Emailed patient back to let him know this has been completed.

## 2014-04-03 ENCOUNTER — Ambulatory Visit: Payer: Medicare Other | Admitting: Cardiovascular Disease

## 2014-04-04 ENCOUNTER — Telehealth: Payer: Self-pay

## 2014-04-04 NOTE — Telephone Encounter (Signed)
Left message for pt to call back if he still wants flu vaccine 

## 2014-04-17 ENCOUNTER — Other Ambulatory Visit: Payer: Self-pay | Admitting: *Deleted

## 2014-04-17 MED ORDER — ISOSORBIDE MONONITRATE ER 60 MG PO TB24: 60.0000 mg | ORAL_TABLET | Freq: Every day | ORAL | Status: DC

## 2014-04-19 ENCOUNTER — Ambulatory Visit (INDEPENDENT_AMBULATORY_CARE_PROVIDER_SITE_OTHER): Payer: Medicare Other | Admitting: Cardiovascular Disease

## 2014-04-19 ENCOUNTER — Encounter: Payer: Self-pay | Admitting: Cardiovascular Disease

## 2014-04-19 VITALS — BP 112/80 | HR 81 | Ht 66.0 in | Wt 184.8 lb

## 2014-04-19 DIAGNOSIS — I25119 Atherosclerotic heart disease of native coronary artery with unspecified angina pectoris: Secondary | ICD-10-CM | POA: Diagnosis not present

## 2014-04-19 DIAGNOSIS — R079 Chest pain, unspecified: Secondary | ICD-10-CM

## 2014-04-19 DIAGNOSIS — G4733 Obstructive sleep apnea (adult) (pediatric): Secondary | ICD-10-CM | POA: Diagnosis not present

## 2014-04-19 DIAGNOSIS — K219 Gastro-esophageal reflux disease without esophagitis: Secondary | ICD-10-CM

## 2014-04-19 DIAGNOSIS — E7800 Pure hypercholesterolemia, unspecified: Secondary | ICD-10-CM

## 2014-04-19 NOTE — Assessment & Plan Note (Signed)
Cholesterol is at goal.  Continue current dose of statin and diet Rx.  No myalgias or side effects.  F/U  LFT's in 6 months. Lab Results  Component Value Date   [facility] 91 07/07/2007   Labs with primary at [facility]

## 2014-04-19 NOTE — Assessment & Plan Note (Signed)
Well controlled.  Continue current medications and low sodium Dash type diet.    

## 2014-04-19 NOTE — Assessment & Plan Note (Signed)
Discussed weight loss continue CPAP

## 2014-04-19 NOTE — Progress Notes (Signed)
Patient ID: Roy [name], male   DOB: 02-Jun-1945, 69 y.o.   MRN: [id number] This is a 69 y.o.  male with history coronary artery disease status post remote stent to the RCA back in 2001.  He y had a cardiac catheterization in January 2014 that showed single vessel obstructive disease involving the second diagonal branch. The stent in the distal RCA was patent. Medical management was advised. Coreg and Imdur were added. PCI would be considered if he had refractory symptoms.   Last myovue 07/03/11  Normal no ischemia   He says that he has 2 or 3 cocktails a night before he has guaranteed to get the chest pain in the morning. When he doesn't drink its not as frequent. He does walk and pushes his lawnmower without any symptoms.   Reviewed cath films with patient. Big dominant RCA with patent distal stent. Lots of calcium in LM and proximal LAD. D1 is long but small caliber and likely would need a 2.0 stent  Native mid and distal LAD also small caliber  Last visit increased coreg and plavix added Continues to take nitro 6x/month  Got dehydrated and dizzy in [place]     ROS: Denies fever, malais, weight loss, blurry vision, decreased visual acuity, cough, sputum, SOB, hemoptysis, pleuritic pain, palpitaitons, heartburn, abdominal pain, melena, lower extremity edema, claudication, or rash.  All other systems reviewed and negative  General: Affect appropriate Healthy:  appears stated age 69: normal Neck supple with no adenopathy JVP normal no bruits no thyromegaly Lungs clear with no wheezing and good diaphragmatic motion Heart:  S1/S2 no murmur, no rub, gallop or click PMI normal Abdomen: benighn, BS positve, no tenderness, no AAA no bruit.  No HSM or HJR Distal pulses intact with no bruits Plus one bilateral edema with venous insuf.  Neuro non-focal Skin warm and dry No muscular weakness   Current Outpatient Prescriptions  Medication Sig Dispense Refill  . ALPRAZolam  (XANAX) 0.25 MG tablet Take 0.25 mg by mouth at bedtime as needed for sleep.    Marland Kitchen aspirin EC 81 MG tablet Take 81 mg by mouth daily.    Marland Kitchen atorvastatin (LIPITOR) 80 MG tablet TAKE 1 TABLET (80 MG TOTAL) BY MOUTH DAILY. 90 tablet 1  . carvedilol (COREG) 12.5 MG tablet Take 1 tablet (12.5 mg total) by mouth 2 (two) times daily. 60 tablet 2  . clopidogrel (PLAVIX) 75 MG tablet Take 1 tablet (75 mg total) by mouth daily. 90 tablet 3  . eszopiclone (LUNESTA) 2 MG TABS Take 2 mg by mouth as needed. Take immediately before bedtime    . isosorbide mononitrate (IMDUR) 60 MG 24 hr tablet Take 1 tablet (60 mg total) by mouth at bedtime. 30 tablet 0  . lisinopril (PRINIVIL,ZESTRIL) 5 MG tablet TAKE 1 TABLET BY MOUTH DAILY 90 tablet 1  . Multiple Vitamins-Minerals (CENTRUM SILVER PO) Take 1 tablet by mouth daily.     . nitroGLYCERIN (NITROSTAT) 0.4 MG SL tablet Place 1 tablet (0.4 mg total) under the tongue every 5 (five) minutes as needed. 25 tablet 6  . omeprazole (PRILOSEC) 20 MG capsule TAKE ONE CAPSULE BY MOUTH EVERY DAY 90 capsule 1  . venlafaxine XR (EFFEXOR-XR) 75 MG 24 hr capsule Take 1 capsule (75 mg total) by mouth daily. 90 capsule 3   No current facility-administered medications for this visit.    Allergies  Review of patient's allergies indicates no known allergies.  Electrocardiogram:  12/14/13  SR rate 94  Normal  slightly poor R wave progression   Assessment and Plan

## 2014-04-19 NOTE — Patient Instructions (Signed)
Your physician wants you to follow-up in:   6  MONTHS WITH DR NISHAN  You will receive a reminder letter in the mail two months in advance. If you don't receive a letter, please call our office to schedule the follow-up appointment. Your physician recommends that you continue on your current medications as directed. Please refer to the Current Medication list given to you today. Your physician has requested that you have en exercise stress myoview. For further information please visit [email or web address]. Please follow instruction sheet, as given.  

## 2014-04-19 NOTE — Assessment & Plan Note (Signed)
Continued angina but stable pattern on reasonable medical Rx  Has new nitro no rest pain  Will order exercise stress myovue and make sure no severe ischemia

## 2014-04-19 NOTE — Assessment & Plan Note (Signed)
Likely related to ETOH  cotinue H2 blocker

## 2014-04-20 ENCOUNTER — Other Ambulatory Visit (INDEPENDENT_AMBULATORY_CARE_PROVIDER_SITE_OTHER): Payer: Medicare Other

## 2014-04-20 DIAGNOSIS — R5382 Chronic fatigue, unspecified: Secondary | ICD-10-CM | POA: Diagnosis not present

## 2014-04-20 DIAGNOSIS — Z125 Encounter for screening for malignant neoplasm of prostate: Secondary | ICD-10-CM

## 2014-04-20 DIAGNOSIS — E7800 Pure hypercholesterolemia, unspecified: Secondary | ICD-10-CM

## 2014-04-20 LAB — COMPREHENSIVE METABOLIC PANEL
ALK PHOS: 63 U/L (ref 39–117)
ALT: 33 U/L (ref 0–53)
AST: 20 U/L (ref 0–37)
Albumin: 3.7 g/dL (ref 3.5–5.2)
BILIRUBIN TOTAL: 0.5 mg/dL (ref 0.2–1.2)
BUN: 18 mg/dL (ref 6–23)
CO2: 29 mEq/L (ref 19–32)
CREATININE: 1.33 mg/dL (ref 0.40–1.50)
Calcium: 9.3 mg/dL (ref 8.4–10.5)
Chloride: 107 mEq/L (ref 96–112)
GFR: 56.8 mL/min — ABNORMAL LOW (ref >60.00)
Glucose, Bld: 112 mg/dL — ABNORMAL HIGH (ref 70–99)
Potassium: 5.4 mEq/L — ABNORMAL HIGH (ref 3.5–5.1)
SODIUM: 140 meq/L (ref 135–145)
Total Protein: 6.3 g/dL (ref 6.0–8.3)

## 2014-04-20 LAB — CBC WITH DIFFERENTIAL/PLATELET
BASOS PCT: 0.3 % (ref 0.0–3.0)
Basophils Absolute: 0 10*3/uL (ref 0.0–0.1)
EOS ABS: 0.2 10*3/uL (ref 0.0–0.7)
EOS PCT: 2.3 % (ref 0.0–5.0)
HEMATOCRIT: 37.1 % — AB (ref 39.0–52.0)
Hemoglobin: 13.3 g/dL (ref 13.0–17.0)
LYMPHS ABS: 1.5 10*3/uL (ref 0.7–4.0)
Lymphocytes Relative: 21.6 % (ref 12.0–46.0)
MCHC: 35.8 g/dL (ref 30.0–36.0)
MCV: 92.5 fl (ref 78.0–100.0)
MONO ABS: 0.7 10*3/uL (ref 0.1–1.0)
Monocytes Relative: 10.3 % (ref 3.0–12.0)
Neutro Abs: 4.4 10*3/uL (ref 1.4–7.7)
Neutrophils Relative %: 65.5 % (ref 43.0–77.0)
Platelets: 217 10*3/uL (ref 150.0–400.0)
RBC: 4.01 Mil/uL — AB (ref 4.22–5.81)
RDW: 13.6 % (ref 11.5–15.5)
WBC: 6.8 10*3/uL (ref 4.0–10.5)

## 2014-04-20 LAB — TSH: TSH: 1.65 u[IU]/mL (ref 0.35–4.50)

## 2014-04-20 LAB — LIPID PANEL
Cholesterol: 166 mg/dL (ref 0–200)
HDL: 47.1 mg/dL (ref >39.00)
LDL CALC: 80 mg/dL (ref 0–99)
NONHDL: 118.9
Total CHOL/HDL Ratio: 4
Triglycerides: 193 mg/dL — ABNORMAL HIGH (ref 0.0–149.0)
VLDL: 38.6 mg/dL (ref 0.0–40.0)

## 2014-04-20 LAB — PSA, MEDICARE: PSA: 0.71 ng/ml (ref 0.10–4.00)

## 2014-04-24 ENCOUNTER — Encounter (HOSPITAL_COMMUNITY): Payer: Self-pay | Admitting: General Practice

## 2014-04-24 ENCOUNTER — Other Ambulatory Visit: Payer: Self-pay

## 2014-04-24 ENCOUNTER — Inpatient Hospital Stay (HOSPITAL_COMMUNITY)
Admission: EM | Admit: 2014-04-24 | Discharge: 2014-04-26 | DRG: 247 | Disposition: A | Discharge status: Home / Self Care | Payer: Medicare Other | Source: Ambulatory Visit | Attending: Cardiovascular Disease | Admitting: Cardiovascular Disease

## 2014-04-24 ENCOUNTER — Ambulatory Visit (HOSPITAL_BASED_OUTPATIENT_CLINIC_OR_DEPARTMENT_OTHER): Payer: Medicare Other | Admitting: Radiology

## 2014-04-24 ENCOUNTER — Encounter (HOSPITAL_COMMUNITY)
Admission: EM | Disposition: A | Discharge status: Home / Self Care | Payer: Medicare Other | Source: Ambulatory Visit | Attending: Cardiovascular Disease

## 2014-04-24 ENCOUNTER — Telehealth: Payer: Self-pay | Admitting: Family Medicine

## 2014-04-24 DIAGNOSIS — Z9842 Cataract extraction status, left eye: Secondary | ICD-10-CM

## 2014-04-24 DIAGNOSIS — Z9861 Coronary angioplasty status: Secondary | ICD-10-CM

## 2014-04-24 DIAGNOSIS — E785 Hyperlipidemia, unspecified: Secondary | ICD-10-CM

## 2014-04-24 DIAGNOSIS — Z7982 Long term (current) use of aspirin: Secondary | ICD-10-CM

## 2014-04-24 DIAGNOSIS — I2119 ST elevation (STEMI) myocardial infarction involving other coronary artery of inferior wall: Secondary | ICD-10-CM | POA: Insufficient documentation

## 2014-04-24 DIAGNOSIS — I2102 ST elevation (STEMI) myocardial infarction involving left anterior descending coronary artery: Secondary | ICD-10-CM | POA: Diagnosis not present

## 2014-04-24 DIAGNOSIS — Z9841 Cataract extraction status, right eye: Secondary | ICD-10-CM | POA: Diagnosis not present

## 2014-04-24 DIAGNOSIS — Z7902 Long term (current) use of antithrombotics/antiplatelets: Secondary | ICD-10-CM | POA: Insufficient documentation

## 2014-04-24 DIAGNOSIS — E1159 Type 2 diabetes mellitus with other circulatory complications: Secondary | ICD-10-CM | POA: Diagnosis present

## 2014-04-24 DIAGNOSIS — Z87891 Personal history of nicotine dependence: Secondary | ICD-10-CM

## 2014-04-24 DIAGNOSIS — F329 Major depressive disorder, single episode, unspecified: Secondary | ICD-10-CM | POA: Diagnosis present

## 2014-04-24 DIAGNOSIS — Z955 Presence of coronary angioplasty implant and graft: Secondary | ICD-10-CM

## 2014-04-24 DIAGNOSIS — I10 Essential (primary) hypertension: Secondary | ICD-10-CM | POA: Insufficient documentation

## 2014-04-24 DIAGNOSIS — I25110 Atherosclerotic heart disease of native coronary artery with unstable angina pectoris: Secondary | ICD-10-CM | POA: Diagnosis not present

## 2014-04-24 DIAGNOSIS — I200 Unstable angina: Secondary | ICD-10-CM

## 2014-04-24 DIAGNOSIS — E780 Pure hypercholesterolemia: Secondary | ICD-10-CM | POA: Diagnosis present

## 2014-04-24 DIAGNOSIS — Z79899 Other long term (current) drug therapy: Secondary | ICD-10-CM | POA: Insufficient documentation

## 2014-04-24 DIAGNOSIS — I2109 ST elevation (STEMI) myocardial infarction involving other coronary artery of anterior wall: Secondary | ICD-10-CM | POA: Diagnosis not present

## 2014-04-24 DIAGNOSIS — I2510 Atherosclerotic heart disease of native coronary artery without angina pectoris: Secondary | ICD-10-CM | POA: Diagnosis present

## 2014-04-24 DIAGNOSIS — I25119 Atherosclerotic heart disease of native coronary artery with unspecified angina pectoris: Secondary | ICD-10-CM | POA: Diagnosis not present

## 2014-04-24 DIAGNOSIS — I152 Hypertension secondary to endocrine disorders: Secondary | ICD-10-CM | POA: Diagnosis present

## 2014-04-24 DIAGNOSIS — G4733 Obstructive sleep apnea (adult) (pediatric): Secondary | ICD-10-CM | POA: Diagnosis present

## 2014-04-24 DIAGNOSIS — R079 Chest pain, unspecified: Secondary | ICD-10-CM | POA: Diagnosis not present

## 2014-04-24 DIAGNOSIS — Z85828 Personal history of other malignant neoplasm of skin: Secondary | ICD-10-CM | POA: Diagnosis not present

## 2014-04-24 DIAGNOSIS — E1169 Type 2 diabetes mellitus with other specified complication: Secondary | ICD-10-CM | POA: Diagnosis present

## 2014-04-24 HISTORY — PX: LEFT HEART CATHETERIZATION WITH CORONARY ANGIOGRAM: SHX5451

## 2014-04-24 LAB — TROPONIN I: Troponin I: 0.07 ng/mL — ABNORMAL HIGH (ref <0.031)

## 2014-04-24 LAB — POCT I-STAT, CHEM 8
BUN: 14 mg/dL (ref 6–23)
CHLORIDE: 107 mmol/L (ref 96–112)
Calcium, Ion: 1.03 mmol/L — ABNORMAL LOW (ref 1.13–1.30)
Creatinine, Ser: 1 mg/dL (ref 0.50–1.35)
GLUCOSE: 101 mg/dL — AB (ref 70–99)
HEMATOCRIT: 33 % — AB (ref 39.0–52.0)
Hemoglobin: 11.2 g/dL — ABNORMAL LOW (ref 13.0–17.0)
POTASSIUM: 3.9 mmol/L (ref 3.5–5.1)
SODIUM: 143 mmol/L (ref 135–145)
TCO2: 17 mmol/L (ref 0–100)

## 2014-04-24 LAB — MRSA PCR SCREENING: MRSA BY PCR: NEGATIVE

## 2014-04-24 LAB — POCT ACTIVATED CLOTTING TIME: Activated Clotting Time: 442 seconds

## 2014-04-24 SURGERY — LEFT HEART CATHETERIZATION WITH CORONARY ANGIOGRAM
Anesthesia: LOCAL

## 2014-04-24 MED ORDER — HYDRALAZINE HCL 20 MG/ML IJ SOLN: 10.0000 mg | INTRAMUSCULAR | Status: DC | PRN

## 2014-04-24 MED ORDER — ONDANSETRON HCL 4 MG/2ML IJ SOLN: 4.0000 mg | Freq: Four times a day (QID) | INTRAMUSCULAR | Status: DC | PRN

## 2014-04-24 MED ORDER — BIVALIRUDIN 250 MG IV SOLR
INTRAVENOUS | Status: AC
  Filled 2014-04-24: qty 250

## 2014-04-24 MED ORDER — HEPARIN (PORCINE) IN NACL 2-0.9 UNIT/ML-% IJ SOLN
INTRAMUSCULAR | Status: AC
  Filled 2014-04-24: qty 1000

## 2014-04-24 MED ORDER — CARVEDILOL 12.5 MG PO TABS
12.5000 mg | ORAL_TABLET | Freq: Two times a day (BID) | ORAL | Status: DC
  Administered 2014-04-24 – 2014-04-26 (×4): 12.5 mg via ORAL
  Filled 2014-04-24 (×6): qty 1

## 2014-04-24 MED ORDER — ACETAMINOPHEN 325 MG PO TABS: 650.0000 mg | ORAL_TABLET | ORAL | Status: DC | PRN

## 2014-04-24 MED ORDER — ASPIRIN EC 81 MG PO TBEC: 81.0000 mg | DELAYED_RELEASE_TABLET | Freq: Every day | ORAL | Status: DC

## 2014-04-24 MED ORDER — ALPRAZOLAM 0.25 MG PO TABS: 0.2500 mg | ORAL_TABLET | Freq: Every evening | ORAL | Status: DC | PRN

## 2014-04-24 MED ORDER — MORPHINE SULFATE 2 MG/ML IJ SOLN: 2.0000 mg | INTRAMUSCULAR | Status: DC | PRN

## 2014-04-24 MED ORDER — ISOSORBIDE MONONITRATE ER 60 MG PO TB24
60.0000 mg | ORAL_TABLET | Freq: Every day | ORAL | Status: DC
  Administered 2014-04-24 – 2014-04-25 (×2): 60 mg via ORAL
  Filled 2014-04-24 (×4): qty 1

## 2014-04-24 MED ORDER — HEPARIN SODIUM (PORCINE) 5000 UNIT/ML IJ SOLN
5000.0000 [IU] | Freq: Once | INTRAMUSCULAR | Status: AC
  Administered 2014-04-24: 5000 [IU] via INTRAVENOUS

## 2014-04-24 MED ORDER — TECHNETIUM TC 99M SESTAMIBI GENERIC - CARDIOLITE
11.0000 | Freq: Once | INTRAVENOUS | Status: AC | PRN
  Administered 2014-04-24: 11 via INTRAVENOUS

## 2014-04-24 MED ORDER — LIDOCAINE HCL (PF) 1 % IJ SOLN
INTRAMUSCULAR | Status: AC
  Filled 2014-04-24: qty 30

## 2014-04-24 MED ORDER — CLOPIDOGREL BISULFATE 300 MG PO TABS
ORAL_TABLET | ORAL | Status: AC
  Filled 2014-04-24: qty 2

## 2014-04-24 MED ORDER — NITROGLYCERIN IN D5W 200-5 MCG/ML-% IV SOLN
INTRAVENOUS | Status: AC
Start: 2014-04-24 — End: 2014-04-24
  Filled 2014-04-24: qty 250

## 2014-04-24 MED ORDER — ATORVASTATIN CALCIUM 80 MG PO TABS
80.0000 mg | ORAL_TABLET | Freq: Every day | ORAL | Status: DC
  Administered 2014-04-24 – 2014-04-25 (×2): 80 mg via ORAL
  Filled 2014-04-24 (×3): qty 1

## 2014-04-24 MED ORDER — ZOLPIDEM TARTRATE 5 MG PO TABS: 5.0000 mg | ORAL_TABLET | Freq: Every evening | ORAL | Status: DC | PRN

## 2014-04-24 MED ORDER — NITROGLYCERIN 1 MG/10 ML FOR IR/CATH LAB
INTRA_ARTERIAL | Status: AC
  Filled 2014-04-24: qty 10

## 2014-04-24 MED ORDER — ASPIRIN 81 MG PO CHEW
81.0000 mg | CHEWABLE_TABLET | Freq: Every day | ORAL | Status: DC
  Administered 2014-04-25 – 2014-04-26 (×2): 81 mg via ORAL
  Filled 2014-04-24 (×2): qty 1

## 2014-04-24 MED ORDER — NITROGLYCERIN 0.4 MG SL SUBL
0.4000 mg | SUBLINGUAL_TABLET | Freq: Once | SUBLINGUAL | Status: AC
  Administered 2014-04-24: 0.4 mg via SUBLINGUAL

## 2014-04-24 MED ORDER — CLOPIDOGREL BISULFATE 75 MG PO TABS: 75.0000 mg | ORAL_TABLET | Freq: Every day | ORAL | Status: DC

## 2014-04-24 MED ORDER — LISINOPRIL 5 MG PO TABS
5.0000 mg | ORAL_TABLET | Freq: Every day | ORAL | Status: DC
  Administered 2014-04-25 – 2014-04-26 (×2): 5 mg via ORAL
  Filled 2014-04-24 (×2): qty 1

## 2014-04-24 MED ORDER — SODIUM CHLORIDE 0.9 % IV SOLN
INTRAVENOUS | Status: AC
  Administered 2014-04-24: 13:00:00 via INTRAVENOUS

## 2014-04-24 MED ORDER — ATORVASTATIN CALCIUM 80 MG PO TABS: 80.0000 mg | ORAL_TABLET | Freq: Every day | ORAL | Status: DC

## 2014-04-24 MED ORDER — PANTOPRAZOLE SODIUM 40 MG PO TBEC
40.0000 mg | DELAYED_RELEASE_TABLET | Freq: Every day | ORAL | Status: DC
  Administered 2014-04-24 – 2014-04-26 (×3): 40 mg via ORAL
  Filled 2014-04-24 (×3): qty 1

## 2014-04-24 MED ORDER — ASPIRIN 81 MG PO CHEW
324.0000 mg | CHEWABLE_TABLET | Freq: Once | ORAL | Status: AC
  Administered 2014-04-24: 324 mg via ORAL

## 2014-04-24 MED ORDER — CLOPIDOGREL BISULFATE 75 MG PO TABS
75.0000 mg | ORAL_TABLET | Freq: Every day | ORAL | Status: DC
  Administered 2014-04-25 – 2014-04-26 (×2): 75 mg via ORAL
  Filled 2014-04-24 (×3): qty 1

## 2014-04-24 MED ORDER — NITROGLYCERIN 0.4 MG SL SUBL: 0.4000 mg | SUBLINGUAL_TABLET | SUBLINGUAL | Status: DC | PRN

## 2014-04-24 MED ORDER — TECHNETIUM TC 99M SESTAMIBI GENERIC - CARDIOLITE
33.0000 | Freq: Once | INTRAVENOUS | Status: AC | PRN
  Administered 2014-04-24: 33 via INTRAVENOUS

## 2014-04-24 NOTE — Progress Notes (Signed)
33fr sheath aspirated and removed from rfa, manua; pressure applied for 20 minutes. Area of hematoma was marked by nursing staff prior to sheath pull, no defined hematoma present prior to removal. Post site status, no hematoma or ecchymosis. Groin level 0. Right dp pulse palpable. Tegaderm dressing applied, bedrest instructions given.   Bedrest begins at 17:30:00

## 2014-04-24 NOTE — Progress Notes (Signed)
[facility] [address] [place], [place] [address] [phone]    Cardiology Nuclear Med Study  Roy [name] is a 69 y.o. male     MRN : [id number]     DOB: 1945-07-11  Procedure Date: 04/24/2014  Nuclear Med Background Indication for Stress Test:  Evaluation for Ischemia and Stent Patency History:  CAD, MPI 2013 (normal) Cardiac Risk Factors: History of Smoking, Hypertension and Lipids  Symptoms:  Chest Pain (last date of chest discomfort this morning in the car)   Nuclear Pre-Procedure Caffeine/Decaff Intake:  None> 12 hrs NPO After: 8:00pm   Lungs:  clear O2 Sat: 97% on room air. IV 0.9% NS with Angio Cath:  22g  IV Site: R Wrist x 1, tolerated well IV Started by:  Irven Baltimore, RN  Chest Size (in):  42 Cup Size: n/a  Height: 5\' 6"  (1.676 m)  Weight:  180 lb (81.647 kg)  BMI:  Body mass index is 29.07 kg/(m^2). Tech Comments:  Patient took NTG x 1 at 0725 while driving with relief of chest pain within 5 minutes per patient.  No chest pain or symptoms on arrival. Patient instructed to notify nurse if any further chest pain while here. Irven Baltimore, RN    Nuclear Med Study 1 or 2 day study: 1 day  Stress Test Type:  Stress  Reading MD: N/A  Order Authorizing Provider:  Jenkins Rouge, MD  Resting Radionuclide: Technetium 2m Sestamibi  Resting Radionuclide Dose: 11.0 mCi   Stress Radionuclide:  n/a  Stress Radionuclide Dose: n/a           Stress Protocol Rest HR: 57 Stress HR: 153  Rest BP: 154/84 Stress BP: 141/79  Exercise Time (min): 3:43 METS: 4.6   Predicted Max HR: 152 bpm % Max HR: 98.68 bpm Rate Pressure Product: 25650   Dose of Adenosine (mg):  n/a Dose of Lexiscan: n/a mg  Dose of Atropine (mg): n/a Dose of Dobutamine: n/a mcg/kg/min (at max HR)  Stress Test Technologist: Glade Lloyd, BS-ES  Nuclear Technologist:  Annye Rusk, CNMT     Rest Procedure:  Myocardial perfusion imaging was performed at rest 45 minutes  following the intravenous administration of Technetium 78m Sestamibi. Rest ECG: NSR - Normal EKG  Stress Procedure: Prior to stress testing the patient was not actively having any chest pain. The patient exercised on the treadmill utilizing the Bruce Protocol for 3:43 minutes. The patient stopped due to ST elevation and chest pain 7/10.  Immediately had patient sit in chair and gave 0.4mg  sublingual nitroglycerin at 0932 and notified Dr. Harrington Challenger.  Dr. Harrington Challenger present reviewed EKG and examined the patient. Dr. Harrington Challenger called EMS and Cath lab. The patient chewed ASA 81 mg x 4 tablets at 0935. Chest pain continues 5/10 with 2nd Nitroglycerin 0.4mg  sublingual given at 0936 with relief of chest pain. Heparin 5000u IVP given at 0938, and  250 mL IV bag premixed Heparin 25,000u started on Alaris Infusion Pump at 1000u /hr per orders by Dr. Dorris Carnes, MD.EMS present and patient remains pain free. The patient was transferred to [facility] Cath Lab via EMS. Irven Baltimore, RN.  Stress ECG: Exercise induced ST elevation in V5-V6 and inferior leads, QRS broadening, frequent PVCs and couplets/3-beat runs of NSVT.  QPS Raw Data Images:  Normal; no motion artifact; normal heart/lung ratio. Stress Images:  n/a Rest Images:  mildly reduced tracer uptake in the inferior wall Subtraction (SDS):  There is a fixed  inferior defect that might represent resting ischemia or diaphragmatic attenuation. Transient Ischemic Dilatation (Normal <1.22):  n/a Lung/Heart Ratio (Normal <0.45):    Quantitative Gated Spect Images QGS EDV:  n/a QGS ESV:  n/a  Impression Exercise Capacity:  Poor exercise capacity. BP Response:  Hypotensive blood pressure response. Clinical Symptoms:  Typical chest pain. ECG Impression:  Significant ST abnormalities consistent with ischemia. ST elevation suggest large "superdominant" right coronary artery or "wrap-around" LAD stenosis Comparison with Prior Nuclear Study: No previous nuclear study  performed  Overall Impression:  High risk stress ECG study consistent with large superdominant right coronary artery stenosis or "wrap-around" LAD artery stenosis. Resting nuclear images show an extensive, but mild inferior defect. In the absence of stress images and gated images, cannot distinguish between resting ischemia and diaphragmatic attenuation.  LV Ejection Fraction: n/a.  LV Wall Motion:  n/a   Sanda Klein, MD, [facility] [phone] office [phone] pager

## 2014-04-24 NOTE — Telephone Encounter (Signed)
Pt called to cancel cpx appointment tomorrow.  [facility] they will call back to schedule hospital follow up at discharge

## 2014-04-24 NOTE — Interval H&P Note (Signed)
Cath Lab Visit (complete for each Cath Lab visit)  Clinical Evaluation Leading to the Procedure:   ACS: Yes.    Non-ACS:    Anginal Classification: CCS IV  Anti-ischemic medical therapy: Maximal Therapy (2 or more classes of medications)  Non-Invasive Test Results: No non-invasive testing performed  Prior CABG: No previous CABG      History and Physical Interval Note:  04/24/2014 10:28 AM  Roy [name]  has presented today for surgery, with the diagnosis of STEMI  The various methods of treatment have been discussed with the patient and family. After consideration of risks, benefits and other options for treatment, the patient has consented to  Procedure(s): LEFT HEART CATHETERIZATION WITH CORONARY ANGIOGRAM (N/A) as a surgical intervention .  The patient's history has been reviewed, patient examined, no change in status, stable for surgery.  I have reviewed the patient's chart and labs.  Questions were answered to the patient's satisfaction.     [name]

## 2014-04-24 NOTE — H&P (View-Only) (Signed)
Patient ID: Roy [name], male   DOB: 1945-07-06, 69 y.o.   MRN: [id number] This is a 69 y.o.  male with history coronary artery disease status post remote stent to the RCA back in 2001.  He y had a cardiac catheterization in January 2014 that showed single vessel obstructive disease involving the second diagonal branch. The stent in the distal RCA was patent. Medical management was advised. Coreg and Imdur were added. PCI would be considered if he had refractory symptoms.   Last myovue 07/03/11  Normal no ischemia   He says that he has 2 or 3 cocktails a night before he has guaranteed to get the chest pain in the morning. When he doesn't drink its not as frequent. He does walk and pushes his lawnmower without any symptoms.   Reviewed cath films with patient. Big dominant RCA with patent distal stent. Lots of calcium in LM and proximal LAD. D1 is long but small caliber and likely would need a 2.0 stent  Native mid and distal LAD also small caliber  Last visit increased coreg and plavix added Continues to take nitro 6x/month  Got dehydrated and dizzy in [place]     ROS: Denies fever, malais, weight loss, blurry vision, decreased visual acuity, cough, sputum, SOB, hemoptysis, pleuritic pain, palpitaitons, heartburn, abdominal pain, melena, lower extremity edema, claudication, or rash.  All other systems reviewed and negative  General: Affect appropriate Healthy:  appears stated age 72: normal Neck supple with no adenopathy JVP normal no bruits no thyromegaly Lungs clear with no wheezing and good diaphragmatic motion Heart:  S1/S2 no murmur, no rub, gallop or click PMI normal Abdomen: benighn, BS positve, no tenderness, no AAA no bruit.  No HSM or HJR Distal pulses intact with no bruits Plus one bilateral edema with venous insuf.  Neuro non-focal Skin warm and dry No muscular weakness   Current Outpatient Prescriptions  Medication Sig Dispense Refill  . ALPRAZolam  (XANAX) 0.25 MG tablet Take 0.25 mg by mouth at bedtime as needed for sleep.    Marland Kitchen aspirin EC 81 MG tablet Take 81 mg by mouth daily.    Marland Kitchen atorvastatin (LIPITOR) 80 MG tablet TAKE 1 TABLET (80 MG TOTAL) BY MOUTH DAILY. 90 tablet 1  . carvedilol (COREG) 12.5 MG tablet Take 1 tablet (12.5 mg total) by mouth 2 (two) times daily. 60 tablet 2  . clopidogrel (PLAVIX) 75 MG tablet Take 1 tablet (75 mg total) by mouth daily. 90 tablet 3  . eszopiclone (LUNESTA) 2 MG TABS Take 2 mg by mouth as needed. Take immediately before bedtime    . isosorbide mononitrate (IMDUR) 60 MG 24 hr tablet Take 1 tablet (60 mg total) by mouth at bedtime. 30 tablet 0  . lisinopril (PRINIVIL,ZESTRIL) 5 MG tablet TAKE 1 TABLET BY MOUTH DAILY 90 tablet 1  . Multiple Vitamins-Minerals (CENTRUM SILVER PO) Take 1 tablet by mouth daily.     . nitroGLYCERIN (NITROSTAT) 0.4 MG SL tablet Place 1 tablet (0.4 mg total) under the tongue every 5 (five) minutes as needed. 25 tablet 6  . omeprazole (PRILOSEC) 20 MG capsule TAKE ONE CAPSULE BY MOUTH EVERY DAY 90 capsule 1  . venlafaxine XR (EFFEXOR-XR) 75 MG 24 hr capsule Take 1 capsule (75 mg total) by mouth daily. 90 capsule 3   No current facility-administered medications for this visit.    Allergies  Review of patient's allergies indicates no known allergies.  Electrocardiogram:  12/14/13  SR rate 94  Normal  slightly poor R wave progression   Assessment and Plan

## 2014-04-24 NOTE — H&P (Signed)
Cardiologist:  Dhanvin Szeto is an 69 y.o. male.   Chief Complaint: Chest Pain HPI:   The patient is a 69 y.o.male with history coronary artery disease status post remote stent to the RCA back in 2001. He had a cardiac catheterization in January 2014 that showed single vessel obstructive disease involving the second diagonal branch. The stent in the distal RCA was patent. Medical management was advised. Coreg and Imdur were added.   Last myovue 07/03/11 Normal no ischemia.  He was last seen by Dr. Johnsie Cancel 04/19/14 at which time he reported that if he has 2 or 3 cocktails the night before, he has guaranteed to get the chest pain in the morning. When he doesn't drink its not as frequent. He has been walking and pushing his lawnmower without any symptoms. Cath films were reviewed with patient during the visit. Big dominant RCA with patent distal stent. Lots of calcium in LM and proximal LAD.  D1 is long but small caliber and likely would need a 2.0 stent. Native mid and distal LAD also small caliber.  Last visit increased coreg and plavix added Continues to take nitro 6x/month Got dehydrated and dizzy in [place].  He presented today from the office where he was having a treadmill stress test.  He has been having CP off and on for a year for which he uses SL NTG.  He never takes more than one at a time and no more than two in an entire day.  He can tell when the pain is coming on.  It radiates down both arms and to jaw.  He gets "clammy" sometimes if it is severe.  This morning he had CP at 0530 hrs and while on route to the office.  During the stress test he developed severe CP and ST elevation. He was given four 81mg  ASA and LS NTG.  He was sent directly to the cath lab for emergent LHC.  The patient currently denies nausea, vomiting, fever, orthopnea, dizziness, PND, cough, congestion, abdominal pain, hematochezia, melena, lower extremity edema, claudication.  Medication Sig    ALPRAZolam (XANAX) 0.25 MG tablet Take 0.25 mg by mouth at bedtime as needed for sleep.  aspirin EC 81 MG tablet Take 81 mg by mouth daily.  atorvastatin (LIPITOR) 80 MG tablet TAKE 1 TABLET (80 MG TOTAL) BY MOUTH DAILY.  carvedilol (COREG) 12.5 MG tablet Take 1 tablet (12.5 mg total) by mouth 2 (two) times daily.  clopidogrel (PLAVIX) 75 MG tablet Take 1 tablet (75 mg total) by mouth daily.  eszopiclone (LUNESTA) 2 MG TABS Take 2 mg by mouth as needed. Take immediately before bedtime  isosorbide mononitrate (IMDUR) 60 MG 24 hr tablet Take 1 tablet (60 mg total) by mouth at bedtime.  lisinopril (PRINIVIL,ZESTRIL) 5 MG tablet TAKE 1 TABLET BY MOUTH DAILY  Multiple Vitamins-Minerals (CENTRUM SILVER PO) Take 1 tablet by mouth daily.   nitroGLYCERIN (NITROSTAT) 0.4 MG SL tablet Place 1 tablet (0.4 mg total) under the tongue every 5 (five) minutes as needed.  omeprazole (PRILOSEC) 20 MG capsule TAKE ONE CAPSULE BY MOUTH EVERY DAY  venlafaxine XR (EFFEXOR-XR) 75 MG 24 hr capsule Take 1 capsule (75 mg total) by mouth daily.     Past Medical History  Diagnosis Date  . CAD (coronary artery disease)     stent to the RCA in 2001. Negative Myoview in January 2012  . HTN (hypertension)   . HLD (hyperlipidemia)   . Depression   .  Vertigo   . OSA (obstructive sleep apnea)   . Basal cell carcinoma     Bilateral Legs    Past Surgical History  Procedure Laterality Date  . Coronary stent placement  2001     RCA  . Tonsillectomy    . Left heart catheterization with coronary angiogram N/A 02/13/2012    Procedure: LEFT HEART CATHETERIZATION WITH CORONARY ANGIOGRAM;  Surgeon: [name], MD;  Location: [facility] CATH LAB;  Service: Cardiovascular;  Laterality: N/A;  . Cataract extraction, bilateral      Family History  Problem Relation Age of Onset  . Cirrhosis Mother   . Alcohol abuse Mother   . Heart disease Father   . Hypertension Father   . Peripheral vascular disease Father   . Depression  Sister    Social History:  reports that he quit smoking about 14 years ago. His smoking use included Cigarettes. He has a 25 pack-year smoking history. He has never used smokeless tobacco. He reports that he drinks about 3.0 oz of alcohol per week. He reports that he does not use illicit drugs.  Allergies: No Known Allergies  Medications Prior to Admission  Medication Sig Dispense Refill  . ALPRAZolam (XANAX) 0.25 MG tablet Take 0.25 mg by mouth at bedtime as needed for sleep.    Marland Kitchen aspirin EC 81 MG tablet Take 81 mg by mouth daily.    Marland Kitchen atorvastatin (LIPITOR) 80 MG tablet TAKE 1 TABLET (80 MG TOTAL) BY MOUTH DAILY. 90 tablet 1  . carvedilol (COREG) 12.5 MG tablet Take 1 tablet (12.5 mg total) by mouth 2 (two) times daily. 60 tablet 2  . clopidogrel (PLAVIX) 75 MG tablet Take 1 tablet (75 mg total) by mouth daily. 90 tablet 3  . eszopiclone (LUNESTA) 2 MG TABS Take 2 mg by mouth as needed. Take immediately before bedtime    . isosorbide mononitrate (IMDUR) 60 MG 24 hr tablet Take 1 tablet (60 mg total) by mouth at bedtime. 30 tablet 0  . lisinopril (PRINIVIL,ZESTRIL) 5 MG tablet TAKE 1 TABLET BY MOUTH DAILY 90 tablet 1  . Multiple Vitamins-Minerals (CENTRUM SILVER PO) Take 1 tablet by mouth daily.     . nitroGLYCERIN (NITROSTAT) 0.4 MG SL tablet Place 1 tablet (0.4 mg total) under the tongue every 5 (five) minutes as needed. 25 tablet 6  . omeprazole (PRILOSEC) 20 MG capsule TAKE ONE CAPSULE BY MOUTH EVERY DAY 90 capsule 1  . venlafaxine XR (EFFEXOR-XR) 75 MG 24 hr capsule Take 1 capsule (75 mg total) by mouth daily. 90 capsule 3    Results for orders placed or performed during the hospital encounter of 04/24/14 (from the past 48 hour(s))  I-STAT, chem 8     Status: Abnormal   Collection Time: 04/24/14 10:41 AM  Result Value Ref Range   Sodium 143 135 - 145 mmol/L   Potassium 3.9 3.5 - 5.1 mmol/L   Chloride 107 96 - 112 mmol/L   BUN 14 6 - 23 mg/dL   Creatinine, Ser 1.00 0.50 - 1.35  mg/dL   Glucose, Bld 101 (H) 70 - 99 mg/dL   Calcium, Ion 1.03 (L) 1.13 - 1.30 mmol/L   TCO2 17 0 - 100 mmol/L   Hemoglobin 11.2 (L) 13.0 - 17.0 g/dL   HCT 33.0 (L) 39.0 - 52.0 %  POCT Activated clotting time     Status: None   Collection Time: 04/24/14 11:04 AM  Result Value Ref Range   Activated Clotting Time 442 seconds  MRSA PCR Screening     Status: None   Collection Time: 04/24/14 12:37 PM  Result Value Ref Range   MRSA by PCR NEGATIVE NEGATIVE    Comment:        The GeneXpert MRSA Assay (FDA approved for NASAL specimens only), is one component of a comprehensive MRSA colonization surveillance program. It is not intended to diagnose MRSA infection nor to guide or monitor treatment for MRSA infections.    No results found.  Review of Systems  Constitutional: Positive for diaphoresis. Negative for fever.  HENT: Negative for congestion and sore throat.   Respiratory: Positive for shortness of breath. Negative for cough.   Cardiovascular: Positive for chest pain. Negative for orthopnea, leg swelling and PND.  Gastrointestinal: Negative for nausea, vomiting, abdominal pain, blood in stool and melena.  Genitourinary: Negative for hematuria.  Musculoskeletal: Positive for myalgias (CHest pain radiated to both arms).  Neurological: Negative for dizziness.  All other systems reviewed and are negative.   Blood pressure 137/96, pulse 72, temperature 98.2 F (36.8 C), temperature source Oral, resp. rate 19, SpO2 97 %. Physical Exam  Nursing note and vitals reviewed. Constitutional: He is oriented to person, place, and time. He appears well-developed and well-nourished.  HENT:  Head: Normocephalic and atraumatic.  Eyes: EOM are normal. Pupils are equal, round, and reactive to light. No scleral icterus.  Neck: Normal range of motion. Neck supple.  Cardiovascular: Normal rate and regular rhythm.   No murmur heard. Respiratory: Effort normal and breath sounds normal. He has  no wheezes. He has no rales.  GI: Soft. Bowel sounds are normal. He exhibits no distension. There is no tenderness.  Musculoskeletal: He exhibits no edema.  Neurological: He is alert and oriented to person, place, and time.  Skin: Skin is warm and dry.  Psychiatric: He has a normal mood and affect.     Assessment/Plan Principal Problem:   Unstable angina Active Problems:   HYPERCHOLESTEROLEMIA   Obstructive sleep apnea   Essential hypertension   Coronary atherosclerosis   CAD (coronary artery disease)  Plan:  The patient was taken emergently to the cath lab for a left heart cath.    Tarri Fuller, [facility] 04/24/2014, 2:32 PM    Agree with note written by Luisa Dago [facility]  Patient with known CAD status post remote RCA stenting back in 2001. He sees Dr. Johnsie Cancel as an outpatient. He's had nitrate responsive chest pain up to 6 times a month. He had a treadmill Myoview this morning and developed chest pain with ST segment elevation in the inferolateral leads with nonsustained ventricular tachycardia which normalized once the stress test was terminated prematurely. Because of this, he was transferred to [facility] for urgent cardiac catheterization.  [name] 04/24/2014 3:31 PM

## 2014-04-24 NOTE — CV Procedure (Signed)
Roy [name] is a 69 y.o. male    [id number] LOCATION:  FACILITY: [facility]  PHYSICIAN: Quay Burow, M.D. Jan 11, 1946   DATE OF PROCEDURE:  04/24/2014  DATE OF DISCHARGE:     CARDIAC CATHETERIZATION / PCI    History obtained from chart review. Roy [name] is a 69 year old gentleman with a history of CAD who had stenting of his RCA back in 2001. His other problems include hypertension and hyperlipidemia. 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 Johnsie Cancel . He has been experiencing nitroglycerin responsive chest pain on a regular basis. He had a stress test ordered today during which she developed chest pain with ST segment elevation inferolaterally and nonsustained ventricular tachycardia. His pain subsided after the test was terminated as EKG changes returned to baseline. He was urgently transferred to [facility] hospital for catheterization.   PROCEDURE DESCRIPTION:   The patient was brought to the second floor [facility]iac cath lab in the postabsorptive state. He was not premedicated  His right groinwas prepped and shaved in usual sterile fashion. Xylocaine 1% was used for local anesthesia. A 6 French sheath was inserted into the right common femoral artery using standard Seldinger technique. 5 French right and left Judkins catheters on the 5 French pigtail catheter was used for selective coronary angiography and left ventriculography respectively. Visipaque dye was used for the entirety of the case. Retrograde aortic, left ventricular and pullback pressures were recorded.   HEMODYNAMICS:    AO SYSTOLIC/AO DIASTOLIC: 409/81   LV SYSTOLIC/LV DIASTOLIC: 191/47  ANGIOGRAPHIC RESULTS:   1. Left main; 20% distal  2. LAD; heavily calcified fluoroscopically. There was a very small first diagonal branch with a 50-70% ostial stenosis slightly larger second branch which arose just distal to this that was subtotally occluded and probably  represented the infarct related vessel 3. Left circumflex; this is a nondominant vessel with a separate ostia and 50-70% ostial stenosis.  4. Right coronary artery; dominant with heavy calcification and some midportion. The stent in the distal portion was widely patent. There was a 90% distal posterolateral branch stenosis 5. Left ventriculography; RAO left ventriculogram was performed using  25 mL of Visipaque dye at 12 mL/second. The overall LVEF estimated  35-40 %  Wit wall motion abnormalities notable for severe anterolateral/anteroapical hypokinesia.  IMPRESSION:Roy [name] has a high grade small to medium size second diagonal branch probably responsible for his inferolateral ST segment elevation and anterolateral wall motion abnormality. He does have a distal posterolateral branch stenosis off his dominant right which I do not think is the culprit. We'll proceed with PCI and stenting using drug-eluting stent and Angiomax. He is already on aspirin and Plavix.  Procedure description: The patient received Angiomax bolus and a QT of 442. Total contrast administered the patient was 180 mL. He was already on dual antibiotic therapy with aspirin and Plavix and I elected to give him 300 mg of Plavix in addition. Using a 6 [place] XB LAD 3-1/2 cm curve guide catheter along with an 014/1 90 cm pro-water guide wire and a 2 mm x 12 mm balloon the culprit vessel was intervened on. The patient did get reproducible pain with balloon inflation. I then deployed a 2.25 mm x 20 mm long synergy drug-eluting stent at 12 atm (2.36 mm) resulting in reduction of a subtotal occlusion to 0% residual with TIMI-3 flow. There was a cyst "step up/step down". I gave 200 g of intra-coronary resolution which resulted  in improvement in the appearance of the distal diagonal branch.  Overall impression: Successful PCI and stenting of a subtotally occluded diagonal branch in the setting of acute coronary syndrome with exercise-induced  ST segment elevation using Angiomax, drug eluting stent, aspirin and Plavix with an excellent angiographic result. There was some oozing around the sheath with a small to medium size hematoma and therefore the sheath was exchanged for a 7 French sheath over a short wire and the Angiomax was discontinued. The patient left the lab in stable condition. We will get a 2-D echocardiogram for LV function. His medicines will be adjusted. He will be observed in 2H/ICU for the next 24-48 hours.  [name] MD, [facility] 04/24/2014 11:55 AM

## 2014-04-24 NOTE — Plan of Care (Signed)
Problem: Consults Goal: Tobacco Cessation referral if indicated Outcome: Not Applicable Date Met:  10/20/10 Quit smoking years ago

## 2014-04-25 ENCOUNTER — Encounter: Payer: Medicare Other | Admitting: Family Medicine

## 2014-04-25 DIAGNOSIS — I2109 ST elevation (STEMI) myocardial infarction involving other coronary artery of anterior wall: Principal | ICD-10-CM

## 2014-04-25 DIAGNOSIS — R079 Chest pain, unspecified: Secondary | ICD-10-CM

## 2014-04-25 LAB — BASIC METABOLIC PANEL
Anion gap: 8 (ref 5–15)
BUN: 13 mg/dL (ref 6–23)
CHLORIDE: 108 mmol/L (ref 96–112)
CO2: 25 mmol/L (ref 19–32)
Calcium: 8.4 mg/dL (ref 8.4–10.5)
Creatinine, Ser: 1.19 mg/dL (ref 0.50–1.35)
GFR calc Af Amer: 71 mL/min — ABNORMAL LOW (ref >90)
GFR, EST NON AFRICAN AMERICAN: 61 mL/min — AB (ref >90)
Glucose, Bld: 117 mg/dL — ABNORMAL HIGH (ref 70–99)
POTASSIUM: 4 mmol/L (ref 3.5–5.1)
SODIUM: 141 mmol/L (ref 135–145)

## 2014-04-25 LAB — CBC
HEMATOCRIT: 35.4 % — AB (ref 39.0–52.0)
HEMOGLOBIN: 12 g/dL — AB (ref 13.0–17.0)
MCH: 30.9 pg (ref 26.0–34.0)
MCHC: 33.9 g/dL (ref 30.0–36.0)
MCV: 91.2 fL (ref 78.0–100.0)
PLATELETS: 152 10*3/uL (ref 150–400)
RBC: 3.88 MIL/uL — AB (ref 4.22–5.81)
RDW: 13.6 % (ref 11.5–15.5)
WBC: 7.5 10*3/uL (ref 4.0–10.5)

## 2014-04-25 LAB — TROPONIN I: Troponin I: 0.04 ng/mL — ABNORMAL HIGH (ref <0.031)

## 2014-04-25 MED FILL — Sodium Chloride IV Soln 0.9%: INTRAVENOUS | Qty: 50 | Status: AC

## 2014-04-25 NOTE — Progress Notes (Signed)
     SUBJECTIVE: No chest pain or SOB.   BP 112/65 mmHg  Pulse 61  Temp(Src) 97.7 F (36.5 C) (Oral)  Resp 15  Ht 5\' 6"  (1.676 m)  Wt 191 lb 2.2 oz (86.7 kg)  BMI 30.87 kg/m2  SpO2 97%  Intake/Output Summary (Last 24 hours) at 04/25/14 0738 Last data filed at 04/25/14 0400  Gross per 24 hour  Intake  762.5 ml  Output   1350 ml  Net -587.5 ml    PHYSICAL EXAM General: Well developed, well nourished, in no acute distress. Alert and oriented x 3.  Psych:  Good affect, responds appropriately Neck: No JVD. No masses noted.  Lungs: Clear bilaterally with no wheezes or rhonci noted.  Heart: RRR with no murmurs noted. Abdomen: Bowel sounds are present. Soft, non-tender.  Extremities: No lower extremity edema.   LABS: Basic Metabolic Panel:  Recent Labs  04/24/14 1041 04/25/14 0230  NA 143 141  K 3.9 4.0  CL 107 108  CO2  --  25  GLUCOSE 101* 117*  BUN 14 13  CREATININE 1.00 1.19  CALCIUM  --  8.4   CBC:  Recent Labs  04/24/14 1041 04/25/14 0230  WBC  --  7.5  HGB 11.2* 12.0*  HCT 33.0* 35.4*  MCV  --  91.2  PLT  --  152   Cardiac Enzymes:  Recent Labs  04/24/14 2006 04/25/14 0230  TROPONINI 0.07* 0.04*   Current Meds: . aspirin  81 mg Oral Daily  . atorvastatin  80 mg Oral q1800  . carvedilol  12.5 mg Oral BID WC  . clopidogrel  75 mg Oral Q breakfast  . isosorbide mononitrate  60 mg Oral QHS  . lisinopril  5 mg Oral Daily  . pantoprazole  40 mg Oral Daily    ASSESSMENT AND PLAN:  1. CAD/Anterolateral STEMI: Admitted yesterday from the office to the cath lab with ST elevation during his stress test. Cardiac cath per Dr. Gwenlyn Found with occluded Diagonal branch treated with DES x 1. He is doing well this am on ASA, Plavix, statin, beta blocker, Ace -inh, Imdur. Echo later today. Will ambulate this am and transfer to telemetry unit. Probably discharge 04/26/14.    MCALHANY,CHRISTOPHER  4/12/20167:38 AM

## 2014-04-25 NOTE — Significant Event (Signed)
Accepted patient from [facility], transferred in via wheelchair with RN Teal. Patient alert/oriented. Gave himself a bath after being settled in room. Patient oriented to room and unit. No family or personal belongings at bedside. Roy [name], Therapist, sports.

## 2014-04-25 NOTE — Significant Event (Signed)
Patient ambulating in the halls independently. Walked approximately 600 feet around unit.

## 2014-04-25 NOTE — Progress Notes (Signed)
Ambulated around unit with patient this a.m. Patient walked around entire unit x 4 Laps. No SOB, no weakness, dizziness or chest pain. SATS maintained >98% on room air. HR in 80's, NSR. Patient tolerated walk very will. Will continue to support and encourage activity.   Roxan Hockey, RN

## 2014-04-25 NOTE — Progress Notes (Signed)
Pt had just walked with RN. He is an avid walker, normally 30-40 min daily. Ed completed with good reception. Interested in [place] and will send referral to [facility]. Await echo to determine function. Pt can walk himself. [id number] Roy [name] CES, ACSM 11:00 AM 04/25/2014

## 2014-04-25 NOTE — Progress Notes (Signed)
*  PRELIMINARY RESULTS* Echocardiogram 2D Echocardiogram has been performed.  Leavy Cella 04/25/2014, 3:54 PM

## 2014-04-26 LAB — BASIC METABOLIC PANEL
Anion gap: 9 (ref 5–15)
BUN: 9 mg/dL (ref 6–23)
CHLORIDE: 108 mmol/L (ref 96–112)
CO2: 23 mmol/L (ref 19–32)
Calcium: 9.2 mg/dL (ref 8.4–10.5)
Creatinine, Ser: 0.96 mg/dL (ref 0.50–1.35)
GFR calc non Af Amer: 83 mL/min — ABNORMAL LOW (ref >90)
GLUCOSE: 130 mg/dL — AB (ref 70–99)
Potassium: 3.8 mmol/L (ref 3.5–5.1)
SODIUM: 140 mmol/L (ref 135–145)

## 2014-04-26 LAB — CBC
HCT: 36.4 % — ABNORMAL LOW (ref 39.0–52.0)
Hemoglobin: 12.3 g/dL — ABNORMAL LOW (ref 13.0–17.0)
MCH: 31.5 pg (ref 26.0–34.0)
MCHC: 33.8 g/dL (ref 30.0–36.0)
MCV: 93.3 fL (ref 78.0–100.0)
Platelets: 147 10*3/uL — ABNORMAL LOW (ref 150–400)
RBC: 3.9 MIL/uL — AB (ref 4.22–5.81)
RDW: 13.7 % (ref 11.5–15.5)
WBC: 6.3 10*3/uL (ref 4.0–10.5)

## 2014-04-26 NOTE — Care Management Note (Signed)
    Page 1 of 1   04/26/2014     2:00:37 PM CARE MANAGEMENT NOTE 04/26/2014  Patient:  ROGEN, PORTE   Account Number:  [id number]  Date Initiated:  04/25/2014  Documentation initiated by:  [facility]  Subjective/Objective Assessment:   Admitted with STEMI - during stress test - emergent cath.     Action/Plan:   Anticipated DC Date:  04/28/2014   Anticipated DC Plan:  [place]  CM consult      Choice offered to / List presented to:             Status of service:  Completed, signed off Medicare Important Message given?  NA - LOS <3 / Initial given by admissions (If response is "NO", the following Medicare IM given date fields will be blank) Date Medicare IM given:   Medicare IM given by:   Date Additional Medicare IM given:   Additional Medicare IM given by:    Discharge Disposition:  HOME/SELF CARE  Per UR Regulation:  Reviewed for med. necessity/level of care/duration of stay  If discussed at [place] of Stay Meetings, dates discussed:    Comments:

## 2014-04-26 NOTE — Discharge Instructions (Signed)
No lifting more than a half gallon of milk or driving for two more days.

## 2014-04-26 NOTE — Discharge Summary (Signed)
Physician Discharge Summary     Cardiologist:  Johnsie Cancel Patient ID: Roy [name] MRN: [id number] DOB/AGE: [birth year] 68 y.o.  Admit date: 04/24/2014 Discharge date: 04/26/2014  Admission Diagnoses:   Unstable Angina  Discharge Diagnoses:  Principal Problem:   Unstable angina Active Problems:   HYPERCHOLESTEROLEMIA   Obstructive sleep apnea   Essential hypertension   Coronary atherosclerosis   CAD (coronary artery disease)   Discharged Condition: stable  Hospital Course:   The patient is a 69 y.o.male with history coronary artery disease status post remote stent to the RCA back in 2001. He had a cardiac catheterization in January 2014 that showed single vessel obstructive disease involving the second diagonal branch. The stent in the distal RCA was patent. Medical management was advised. Coreg and Imdur were added. Last myovue 07/03/11 Normal no ischemia. He was last seen by Dr. Johnsie Cancel 04/19/14 at which time he reported that if he has 2 or 3 cocktails the night before, he has guaranteed to get the chest pain in the morning. When he doesn't drink its not as frequent. He has been walking and pushing his lawnmower without any symptoms. Cath films were reviewed with patient during the visit. Big dominant RCA with patent distal stent. Lots of calcium in LM and proximal LAD. D1 is long but small caliber and likely would need a 2.0 stent. Native mid and distal LAD also small caliber. Last visit increased coreg and plavix added Continues to take nitro 6x/month Got dehydrated and dizzy in [place]. He presented today from the office where he was having a treadmill stress test. He has been having CP off and on for a year for which he uses SL NTG. He never takes more than one at a time and no more than two in an entire day. He can tell when the pain is coming on. It radiates down both arms and to jaw. He gets "clammy" sometimes if it is severe. This morning he had CP  at 0530 hrs and while on route to the office. During the stress test he developed severe CP and ST elevation. He was given four $RemoveBe'81mg'kVEbwyNwr$  ASA and LS NTG. He was sent directly to the cath lab for emergent LHC. The patient currently denies nausea, vomiting, fever, orthopnea, dizziness, PND, cough, congestion, abdominal pain, hematochezia, melena, lower extremity edema, claudication.  He was taken directly to the cath lab for emergent LHC.  The procedure revealed an LAD that is heavily calcified fluoroscopically. There was a very small first diagonal branch with a 50-70% ostial stenosis slightly larger second branch which arose just distal to this that was subtotally occluded and probably represented the infarct related vessel.  He underwent successful PCI and stenting with a DES.  An extra $Remove'300mg'GUlGOZH$  Plavix was given.  Also on ASA, statin, imdur, ACE-I and BB.  Seen by cardiac rehab.  Ambulating without difficulty.  Interested in phase II.  EF 55-60% by echo G1DD Trace MR/TR.  The patient was seen by Dr. Mare Ferrari who felt he was stable for DC home.    Consults: Cardiac Rehab  Significant Diagnostic Studies: CARDIAC CATHETERIZATION / PCI   PROCEDURE DESCRIPTION:   The patient was brought to the second floor [facility]iac cath lab in the postabsorptive state. He was not premedicated His right groinwas prepped and shaved in usual sterile fashion. Xylocaine 1% was used for local anesthesia. A 6 French sheath was inserted into the right common femoral artery using standard Seldinger technique. 5 French right and  left Judkins catheters on the 5 French pigtail catheter was used for selective coronary angiography and left ventriculography respectively. Visipaque dye was used for the entirety of the case. Retrograde aortic, left ventricular and pullback pressures were recorded.   HEMODYNAMICS:   AO SYSTOLIC/AO DIASTOLIC: 299/37  LV SYSTOLIC/LV DIASTOLIC: 169/67  ANGIOGRAPHIC RESULTS:   1. Left main;  20% distal  2. LAD; heavily calcified fluoroscopically. There was a very small first diagonal branch with a 50-70% ostial stenosis slightly larger second branch which arose just distal to this that was subtotally occluded and probably represented the infarct related vessel 3. Left circumflex; this is a nondominant vessel with a separate ostia and 50-70% ostial stenosis.  4. Right coronary artery; dominant with heavy calcification and some midportion. The stent in the distal portion was widely patent. There was a 90% distal posterolateral branch stenosis 5. Left ventriculography; RAO left ventriculogram was performed using  25 mL of Visipaque dye at 12 mL/second. The overall LVEF estimated  35-40 % Wit wall motion abnormalities notable for severe anterolateral/anteroapical hypokinesia.  IMPRESSION:Mr. Dass has a high grade small to medium size second diagonal branch probably responsible for his inferolateral ST segment elevation and anterolateral wall motion abnormality. He does have a distal posterolateral branch stenosis off his dominant right which I do not think is the culprit. We'll proceed with PCI and stenting using drug-eluting stent and Angiomax. He is already on aspirin and Plavix.  Procedure description: The patient received Angiomax bolus and a QT of 442. Total contrast administered the patient was 180 mL. He was already on dual antibiotic therapy with aspirin and Plavix and I elected to give him 300 mg of Plavix in addition. Using a 6 [place] XB LAD 3-1/2 cm curve guide catheter along with an 014/1 90 cm pro-water guide wire and a 2 mm x 12 mm balloon the culprit vessel was intervened on. The patient did get reproducible pain with balloon inflation. I then deployed a 2.25 mm x 20 mm long synergy drug-eluting stent at 12 atm (2.36 mm) resulting in reduction of a subtotal occlusion to 0% residual with TIMI-3 flow. There was a cyst "step up/step down". I gave 200 g of intra-coronary  resolution which resulted in improvement in the appearance of the distal diagonal branch.  Overall impression: Successful PCI and stenting of a subtotally occluded diagonal branch in the setting of acute coronary syndrome with exercise-induced ST segment elevation using Angiomax, drug eluting stent, aspirin and Plavix with an excellent angiographic result. There was some oozing around the sheath with a small to medium size hematoma and therefore the sheath was exchanged for a 7 French sheath over a short wire and the Angiomax was discontinued. The patient left the lab in stable condition. We will get a 2-D echocardiogram for LV function. His medicines will be adjusted. He will be observed in 2H/ICU for the next 24-48 hours.  [name] MD, [facility] 04/24/2014  Echo Study Conclusions  - Left ventricle: The cavity size was normal. Wall thickness was increased in a pattern of mild LVH. Systolic function was normal. The estimated ejection fraction was in the range of 55% to 60%. Wall motion was normal; there were no regional wall motion abnormalities. Doppler parameters are consistent with abnormal left ventricular relaxation (grade 1 diastolic dysfunction). - Mitral valve: Calcified annulus.  Impressions:  - Normal LV function; grade 1 diastolic dysfunction; trace MR and TR.  Treatments:See above  Discharge Exam: Blood pressure 112/69, pulse 66, temperature 97.5 F (  36.4 C), temperature source Oral, resp. rate 17, height $RemoveBe'5\' 6"'jchtZhNid$  (1.676 m), weight 191 lb 2.2 oz (86.7 kg), SpO2 95 %.   Disposition: 01-Home or Self Care      Discharge Instructions    Amb Referral to Cardiac Rehabilitation    Complete by:  As directed      Diet - low sodium heart healthy    Complete by:  As directed      Increase activity slowly    Complete by:  As directed             Medication List    TAKE these medications        ALPRAZolam 0.25 MG tablet  Commonly known as:  XANAX  Take  0.25 mg by mouth at bedtime as needed for sleep.     aspirin EC 81 MG tablet  Take 81 mg by mouth daily.     atorvastatin 80 MG tablet  Commonly known as:  LIPITOR  TAKE 1 TABLET (80 MG TOTAL) BY MOUTH DAILY.     carvedilol 12.5 MG tablet  Commonly known as:  COREG  Take 1 tablet (12.5 mg total) by mouth 2 (two) times daily.     CENTRUM SILVER PO  Take 1 tablet by mouth daily.     clopidogrel 75 MG tablet  Commonly known as:  PLAVIX  Take 1 tablet (75 mg total) by mouth daily.     eszopiclone 2 MG Tabs tablet  Commonly known as:  LUNESTA  Take 2 mg by mouth as needed. Take immediately before bedtime     isosorbide mononitrate 60 MG 24 hr tablet  Commonly known as:  IMDUR  Take 1 tablet (60 mg total) by mouth at bedtime.     lisinopril 5 MG tablet  Commonly known as:  PRINIVIL,ZESTRIL  TAKE 1 TABLET BY MOUTH DAILY     Magnesium Oxide 200 MG Tabs  Take 200 mg by mouth daily.     nitroGLYCERIN 0.4 MG SL tablet  Commonly known as:  NITROSTAT  Place 1 tablet (0.4 mg total) under the tongue every 5 (five) minutes as needed.     omeprazole 20 MG capsule  Commonly known as:  PRILOSEC  TAKE ONE CAPSULE BY MOUTH EVERY DAY     venlafaxine XR 75 MG 24 hr capsule  Commonly known as:  EFFEXOR-XR  Take 1 capsule (75 mg total) by mouth daily.       Follow-up Information    Follow up with Melina Copa, PA-C On 05/24/2014.   Specialty:  Cardiology   Why:  9:45 AM   Contact information:   [address] [place] [address] [phone]      Greater than 30 minutes was spent completing the patient's discharge.    SignedTarri Fuller , [place]  04/26/2014, 9:17 AM

## 2014-04-26 NOTE — Progress Notes (Signed)
Patient Name: Roy [name] Date of Encounter: 04/26/2014     Principal Problem:   Unstable angina Active Problems:   HYPERCHOLESTEROLEMIA   Obstructive sleep apnea   Essential hypertension   Coronary atherosclerosis   CAD (coronary artery disease)    SUBJECTIVE  The patient has had no problems overnight.  No chest pain or shortness of breath.  He is tolerating ambulating in the hall.  Rhythm is stable normal sinus rhythm. 2-D echo done yesterday shows normal left ventricular systolic function with grade 1 diastolic dysfunction  CURRENT MEDS . aspirin  81 mg Oral Daily  . atorvastatin  80 mg Oral q1800  . carvedilol  12.5 mg Oral BID WC  . clopidogrel  75 mg Oral Q breakfast  . isosorbide mononitrate  60 mg Oral QHS  . lisinopril  5 mg Oral Daily  . pantoprazole  40 mg Oral Daily    OBJECTIVE  Filed Vitals:   04/25/14 0900 04/25/14 1150 04/25/14 2057 04/26/14 0612  BP: 132/78 125/87 140/90 112/69  Pulse: 70 65 65 66  Temp:  97.9 F (36.6 C) 98.7 F (37.1 C) 97.5 F (36.4 C)  TempSrc:  Oral Oral Oral  Resp: 15 16 17 17   Height:      Weight:      SpO2: 97% 97% 98% 95%    Intake/Output Summary (Last 24 hours) at 04/26/14 2130 Last data filed at 04/25/14 2300  Gross per 24 hour  Intake    120 ml  Output    250 ml  Net   -130 ml   Filed Weights   04/24/14 1300  Weight: 191 lb 2.2 oz (86.7 kg)    PHYSICAL EXAM  General: Pleasant, NAD. Neuro: Alert and oriented X 3. Moves all extremities spontaneously. Psych: Normal affect. HEENT:  Normal  Neck: Supple without bruits or JVD. Lungs:  Resp regular and unlabored, CTA. Heart: RRR no s3, s4, or murmurs. Abdomen: Soft, non-tender, non-distended, BS + x 4.  Extremities: No clubbing, cyanosis or edema. DP/PT/Radials 2+ and equal bilaterally.  Accessory Clinical Findings  CBC  Recent Labs  04/25/14 0230 04/26/14 0355  WBC 7.5 6.3  HGB 12.0* 12.3*  HCT 35.4* 36.4*  MCV 91.2 93.3  PLT 152 147*     Basic Metabolic Panel  Recent Labs  04/25/14 0230 04/26/14 0355  NA 141 140  K 4.0 3.8  CL 108 108  CO2 25 23  GLUCOSE 117* 130*  BUN 13 9  CREATININE 1.19 0.96  CALCIUM 8.4 9.2   Liver Function Tests No results for input(s): AST, ALT, ALKPHOS, BILITOT, PROT, ALBUMIN in the last 72 hours. No results for input(s): LIPASE, AMYLASE in the last 72 hours. Cardiac Enzymes  Recent Labs  04/24/14 2006 04/25/14 0230 04/25/14 0850  TROPONINI 0.07* 0.04* <0.03   BNP Invalid input(s): POCBNP D-Dimer No results for input(s): DDIMER in the last 72 hours. Hemoglobin A1C No results for input(s): HGBA1C in the last 72 hours. Fasting Lipid Panel No results for input(s): CHOL, HDL, LDLCALC, TRIG, CHOLHDL, LDLDIRECT in the last 72 hours. Thyroid Function Tests No results for input(s): TSH, T4TOTAL, T3FREE, THYROIDAB in the last 72 hours.  Invalid input(s): FREET3  TELE  Normal sinus rhythm  ECG  2D Echo: - Left ventricle: The cavity size was normal. Wall thickness was increased in a pattern of mild LVH. Systolic function was normal. The estimated ejection fraction was in the range of 55% to 60%. Wall motion was normal; there were  no regional wall motion abnormalities. Doppler parameters are consistent with abnormal left ventricular relaxation (grade 1 diastolic dysfunction). - Mitral valve: Calcified annulus.  Impressions:  - Normal LV function; grade 1 diastolic dysfunction; trace MR and TR. Radiology/Studies  No results found.  ASSESSMENT AND PLAN 1.  Coronary artery disease.Cardiac cath per Dr. Gwenlyn Found with occluded Diagonal branch treated with DES x 1. 2.  Essential hypertension 3.  Obstructive sleep apnea 4.  Hypercholesterolemia  Plan: Okay for discharge today.  He will follow-up with his primary cardiologist Dr. Johnsie Cancel  Signed, Darlin Coco MD

## 2014-04-26 NOTE — Progress Notes (Signed)
Patient discharged to home. Discharge instructions given, patient verbalized understanding of instructions given. Patient taken out to private vehicle via wheelchair. [place], RN

## 2014-05-03 ENCOUNTER — Encounter: Payer: Self-pay | Admitting: Family Medicine

## 2014-05-17 ENCOUNTER — Other Ambulatory Visit: Payer: Self-pay

## 2014-05-17 MED ORDER — CARVEDILOL 12.5 MG PO TABS: 12.5000 mg | ORAL_TABLET | Freq: Two times a day (BID) | ORAL | Status: DC

## 2014-05-17 MED ORDER — ISOSORBIDE MONONITRATE ER 60 MG PO TB24: 60.0000 mg | ORAL_TABLET | Freq: Every day | ORAL | Status: DC

## 2014-05-22 ENCOUNTER — Other Ambulatory Visit: Payer: Self-pay | Admitting: Cardiovascular Disease

## 2014-05-24 ENCOUNTER — Ambulatory Visit (INDEPENDENT_AMBULATORY_CARE_PROVIDER_SITE_OTHER): Payer: Medicare Other | Admitting: Cardiology

## 2014-05-24 ENCOUNTER — Encounter: Payer: Self-pay | Admitting: Cardiology

## 2014-05-24 VITALS — BP 120/80 | HR 71 | Ht 66.0 in | Wt 180.4 lb

## 2014-05-24 DIAGNOSIS — I10 Essential (primary) hypertension: Secondary | ICD-10-CM | POA: Diagnosis not present

## 2014-05-24 DIAGNOSIS — I2510 Atherosclerotic heart disease of native coronary artery without angina pectoris: Secondary | ICD-10-CM

## 2014-05-24 DIAGNOSIS — R079 Chest pain, unspecified: Secondary | ICD-10-CM | POA: Insufficient documentation

## 2014-05-24 DIAGNOSIS — Z9861 Coronary angioplasty status: Secondary | ICD-10-CM

## 2014-05-24 DIAGNOSIS — I25118 Atherosclerotic heart disease of native coronary artery with other forms of angina pectoris: Secondary | ICD-10-CM | POA: Diagnosis not present

## 2014-05-24 DIAGNOSIS — E785 Hyperlipidemia, unspecified: Secondary | ICD-10-CM

## 2014-05-24 MED ORDER — RANOLAZINE ER 500 MG PO TB12: 500.0000 mg | ORAL_TABLET | Freq: Two times a day (BID) | ORAL | Status: DC

## 2014-05-24 NOTE — Assessment & Plan Note (Signed)
I'm concerned he may be having angina secondary to residual disease.

## 2014-05-24 NOTE — Assessment & Plan Note (Signed)
On high dose statin.

## 2014-05-24 NOTE — Assessment & Plan Note (Signed)
Pt sent from the office 04/24/14 after he had chest pain, NSVT, and ST elevation on a treadmill test.

## 2014-05-24 NOTE — Progress Notes (Signed)
05/24/2014 Roy [name]   1945/05/03  [id number]  Primary Physician Amy Diona Browner, MD Primary Cardiologist: Dr Johnsie Cancel  HPI:  69 y.o.male with history coronary artery disease status post remote stent to the RCA back in 2001. He had a cardiac catheterization in January 2014 that showed single vessel obstructive disease involving the second diagonal branch. The stent in the distal RCA was patent. Medical management was advised.    Recently he was seen for recurrent symptoms. He had been having CP off and on for a year for which he used SL NTG. He was set up to have an exercise Myoview at the office 04/24/14. During the stress test he developed severe CP and ST elevation. He was given four 81mg  ASA and LS NTG. He was sent directly to the cath lab for emergent LHC. The procedure revealed an LAD that is heavily calcified. There was a very small first diagonal branch with a 50-70% ostial stenosis, and a larger second branch that was subtotally occluded and probably represented the infarct related vessel. He underwent successful PCI and stenting with a DES. He is seen in the office today in f/u. He tells me he had been doing well until the past week. He noted on episode of Rt arm and chest pain that was typical of his angina. It was not during any exertion. He notes he has felt the beginings of chest discomfort but if he stops whatever he is doing and sits down it goes away.    Current Outpatient Prescriptions  Medication Sig Dispense Refill  . ALPRAZolam (XANAX) 0.25 MG tablet Take 0.25 mg by mouth at bedtime as needed for sleep.    Marland Kitchen aspirin EC 81 MG tablet Take 81 mg by mouth daily.    Marland Kitchen atorvastatin (LIPITOR) 80 MG tablet TAKE 1 TABLET (80 MG TOTAL) BY MOUTH DAILY. 90 tablet 1  . carvedilol (COREG) 12.5 MG tablet Take 1 tablet (12.5 mg total) by mouth 2 (two) times daily. 60 tablet 6  . clopidogrel (PLAVIX) 75 MG tablet Take 1 tablet (75 mg total) by mouth daily. 90 tablet 3  .  eszopiclone (LUNESTA) 2 MG TABS Take 2 mg by mouth as needed. Take immediately before bedtime    . isosorbide mononitrate (IMDUR) 60 MG 24 hr tablet Take 1 tablet (60 mg total) by mouth at bedtime. 30 tablet 6  . lisinopril (PRINIVIL,ZESTRIL) 5 MG tablet TAKE 1 TABLET BY MOUTH DAILY 90 tablet 1  . Magnesium Oxide 200 MG TABS Take 200 mg by mouth daily.    . Multiple Vitamins-Minerals (CENTRUM SILVER PO) Take 1 tablet by mouth daily.     . nitroGLYCERIN (NITROSTAT) 0.4 MG SL tablet Place 1 tablet (0.4 mg total) under the tongue every 5 (five) minutes as needed. 25 tablet 6  . omeprazole (PRILOSEC) 20 MG capsule TAKE ONE CAPSULE BY MOUTH EVERY DAY 90 capsule 1  . venlafaxine XR (EFFEXOR-XR) 75 MG 24 hr capsule Take 1 capsule (75 mg total) by mouth daily. 90 capsule 3  . ranolazine (RANEXA) 500 MG 12 hr tablet Take 1 tablet (500 mg total) by mouth 2 (two) times daily. 90 tablet 3   No current facility-administered medications for this visit.    No Known Allergies  History   Social History  . Marital Status: Legally Separated    Spouse Name: N/A  . Number of Children: N/A  . Years of Education: N/A   Occupational History  . Not on file.   Social  History Main Topics  . Smoking status: Former Smoker -- 1.00 packs/day for 25 years    Types: Cigarettes    Quit date: 01/14/2000  . Smokeless tobacco: Never Used  . Alcohol Use: 3.0 oz/week    5 Standard drinks or equivalent per week  . Drug Use: No  . Sexual Activity: Not Currently   Other Topics Concern  . 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     Review of Systems: General: negative for chills, fever, night sweats or weight changes.  Cardiovascular: negative for chest pain, dyspnea on exertion, edema, orthopnea, palpitations, paroxysmal nocturnal dyspnea or shortness of breath Dermatological: negative for rash Respiratory:  negative for cough or wheezing Urologic: negative for hematuria Abdominal: negative for nausea, vomiting, diarrhea, bright red blood per rectum, melena, or hematemesis Neurologic: negative for visual changes, syncope, or dizziness All other systems reviewed and are otherwise negative except as noted above.    Blood pressure 120/80, pulse 71, height 5\' 6"  (1.676 m), weight 180 lb 6.4 oz (81.829 kg).  General appearance: alert, cooperative and no distress Neck: no carotid bruit and no JVD Lungs: clear to auscultation bilaterally Heart: regular rate and rhythm Extremities: extremities normal, atraumatic, no cyanosis or edema Skin: Skin color, texture, turgor normal. No rashes or lesions Neurologic: Grossly normal    Cath 04/24/14 ANGIOGRAPHIC RESULTS:   1. Left main; 20% distal  2. LAD; heavily calcified fluoroscopically. There was a very small first diagonal branch with a 50-70% ostial stenosis slightly larger second branch which arose just distal to this that was subtotally occluded and probably represented the infarct related vessel 3. Left circumflex; this is a nondominant vessel with a separate ostia and 50-70% ostial stenosis.  4. Right coronary artery; dominant with heavy calcification and some midportion. The stent in the distal portion was widely patent. There was a 90% distal posterolateral branch stenosis 5. Left ventriculography; RAO left ventriculogram was performed using  25 mL of Visipaque dye at 12 mL/second. The overall LVEF estimated  35-40 % Wit wall motion abnormalities notable for severe anterolateral/anteroapical hypokinesia.   Echo 04/25/14 Study Conclusions  - Left ventricle: The cavity size was normal. Wall thickness was increased in a pattern of mild LVH. Systolic function was normal. The estimated ejection fraction was in the range of 55% to 60%. Wall motion was normal; there were no regional wall motion abnormalities. Doppler parameters are  consistent with abnormal left ventricular relaxation (grade 1 diastolic dysfunction). - Mitral valve: Calcified annulus.  Impressions:  - Normal LV function; grade 1 diastolic dysfunction; trace MR and TR.    ASSESSMENT AND PLAN:   Chest pain with moderate risk for cardiac etiology Pt has had one episode of chest pain Rt arm pain since discharge.   CAD, RCA PCI 2001, urgent Dx2 DES 04/24/14 Pt sent from the office 04/24/14 after he had chest pain, NSVT, and ST elevation on a treadmill test.    Coronary atherosclerosis-residual PDA, LAD, CFX disease I'm concerned he may be having angina secondary to residual disease.    Essential hypertension Controlled   Dyslipidemia On high dose statin    PLAN  He had significant residual CAD in the Dx1,CXF, and distal PDA at cath. I added Ranexa 500 mg BID to his current medications. I gave him samples. If he feels its helping he'll fill the Rx. He should  be seen in f/u in 2 weeks or so to see how he is doing.   Mackenzee Becvar KPA-C 05/24/2014 10:41 AM

## 2014-05-24 NOTE — Assessment & Plan Note (Signed)
Controlled.  

## 2014-05-24 NOTE — Assessment & Plan Note (Signed)
Pt has had one episode of chest pain Rt arm pain since discharge.

## 2014-05-24 NOTE — Patient Instructions (Addendum)
Medication Instructions:  Your physician has recommended you make the following change in your medication:  START Ranexa 500 mg by mouth twice dialy   Labwork: NONE  Testing/Procedures: NONE  Follow-Up: Your physician recommends that you schedule a follow-up appointment in: 2 weeks with [name] PA or another PA/NP  Your physician wants you to follow-up in: 3 months with Dr. Johnsie Cancel. You will receive a reminder letter in the mail two months in advance. If you don't receive a letter, please call our office to schedule the follow-up appointment.    Any Other Special Instructions Will Be Listed Below (If Applicable).

## 2014-05-29 ENCOUNTER — Encounter: Payer: Self-pay | Admitting: Family Medicine

## 2014-06-01 ENCOUNTER — Encounter: Payer: Self-pay | Admitting: Family Medicine

## 2014-06-11 ENCOUNTER — Other Ambulatory Visit: Payer: Self-pay | Admitting: Cardiovascular Disease

## 2014-06-18 NOTE — Progress Notes (Signed)
Cardiology Office Note   Date:  06/19/2014   ID:  Roy [name], Roy [name] January 10, 1946, MRN [id number]  PCP:  Eliezer Lofts, MD  Cardiologist:  Dr. Jenkins Rouge     Chief Complaint  Patient presents with  . Coronary Artery Disease     History of Present Illness: [name] is a 69 y.o. male with a hx of CAD s/p prior stent to the RCA in 2001, HTN, HL, OSA.  He had a recent admission 4/16 with chest pain and STE during a stress test.  LHC demonstrated a subtotally occluded D2 which was treated with a Synergy DES.  There was a residual distal PLB 90% treated medically.  He was seen by [name], PA-C in FU on 05/24/14.  He was having some residual chest pain and Ranexa was started. He returns for FU.  He continues to have occasional episodes of angina. This is described as anterior tightness with radiation to bilateral arms. He can take Dr. Darrow Bussing with prompt relief. It usually occurs in the mornings. He denies exertional symptoms. He's only had 2-3 episodes since last seen. There has not been a noticeable improvement since starting on Ranexa. Ranexa is quite expensive for him. Overall, his angina has significantly improved since PCI. He denies significant dyspnea, orthopnea, PND or edema. He denies syncope. He denies any chest discomfort related to meals. He denies pleuritic chest discomfort.  Studies/Reports Reviewed Today:  LHC/PCI 04/24/14 1. Left main; 20% distal   2. LAD: Very small D1 50-70% ostial, D2 subtotally occluded 3. LCx:  50-70% ostial stenosis.   4. RCA: Patent stent in the distal portion; 90% distal PLB 5. LVEF 35-40%  Severe anterolateral/anteroapical hypokinesia. PCI:  Synergy DES 2.25 x 20 mm to D2  Echo 04/25/14 - Mild LVH. EF 55% to 60%.  Wall motion was normal; Grade 1 diastolic dysfunction). - Mitral valve: Calcified annulus. Impressions:   Normal LV function; grade 1 diastolic dysfunction; trace MR and TR.    Past Medical History  Diagnosis Date  .  CAD (coronary artery disease)     stent to the RCA in 2001. Negative Myoview in January 2012  . HTN (hypertension)   . HLD (hyperlipidemia)   . Depression   . Vertigo   . OSA (obstructive sleep apnea)   . Basal cell carcinoma     Bilateral Legs    Past Surgical History  Procedure Laterality Date  . Coronary stent placement  2001     RCA  . Tonsillectomy    . Left heart catheterization with coronary angiogram N/A 02/13/2012    Procedure: LEFT HEART CATHETERIZATION WITH CORONARY ANGIOGRAM;  Surgeon: [name], MD;  Location: [facility] CATH LAB;  Service: Cardiovascular;  Laterality: N/A;  . Cataract extraction, bilateral    . Left heart catheterization with coronary angiogram N/A 04/24/2014    Procedure: LEFT HEART CATHETERIZATION WITH CORONARY ANGIOGRAM;  Surgeon: [name], MD;  Location: [facility] CATH LAB;  Service: Cardiovascular;  Laterality: N/A;     Current Outpatient Prescriptions  Medication Sig Dispense Refill  . ALPRAZolam (XANAX) 0.25 MG tablet Take 0.25 mg by mouth at bedtime as needed for sleep.    Marland Kitchen aspirin EC 81 MG tablet Take 81 mg by mouth daily.    Marland Kitchen atorvastatin (LIPITOR) 80 MG tablet TAKE 1 TABLET (80 MG TOTAL) BY MOUTH DAILY. 90 tablet 1  . carvedilol (COREG) 12.5 MG tablet Take 1 tablet (12.5 mg total) by mouth 2 (two) times daily.  60 tablet 6  . clopidogrel (PLAVIX) 75 MG tablet Take 1 tablet (75 mg total) by mouth daily. 90 tablet 3  . eszopiclone (LUNESTA) 2 MG TABS Take 2 mg by mouth as needed. Take immediately before bedtime    . isosorbide mononitrate (IMDUR) 60 MG 24 hr tablet Take 1 tablet (60 mg total) by mouth at bedtime. 30 tablet 6  . lisinopril (PRINIVIL,ZESTRIL) 5 MG tablet TAKE 1 TABLET BY MOUTH DAILY 90 tablet 0  . Magnesium Oxide 200 MG TABS Take 200 mg by mouth daily.    . Multiple Vitamins-Minerals (CENTRUM SILVER PO) Take 1 tablet by mouth daily.     . nitroGLYCERIN (NITROSTAT) 0.4 MG SL tablet Place 1 tablet (0.4 mg total) under the tongue  every 5 (five) minutes as needed. 25 tablet 6  . omeprazole (PRILOSEC) 20 MG capsule TAKE ONE CAPSULE BY MOUTH EVERY DAY 90 capsule 1  . ranolazine (RANEXA) 500 MG 12 hr tablet Take 1 tablet (500 mg total) by mouth 2 (two) times daily. 90 tablet 3  . venlafaxine XR (EFFEXOR-XR) 75 MG 24 hr capsule Take 1 capsule (75 mg total) by mouth daily. 90 capsule 3   No current facility-administered medications for this visit.    Allergies:   Review of patient's allergies indicates no known allergies.    Social History:  The patient  reports that he quit smoking about 14 years ago. His smoking use included Cigarettes. He has a 25 pack-year smoking history. He has never used smokeless tobacco. He reports that he drinks about 3.0 oz of alcohol per week. He reports that he does not use illicit drugs.   Family History:  The patient's family history includes Alcohol abuse in his mother; Cirrhosis in his mother; Depression in his sister; Heart disease in his father; Hypertension in his father; Peripheral vascular disease in his father. There is no history of Heart attack or Stroke.    ROS:   Please see the history of present illness.   Review of Systems  All other systems reviewed and are negative.    PHYSICAL EXAM: VS:  BP 109/70 mmHg  Pulse 72  Ht 5\' 6"  (1.676 m)  Wt 180 lb (81.647 kg)  BMI 29.07 kg/m2    Wt Readings from Last 3 Encounters:  06/19/14 180 lb (81.647 kg)  05/24/14 180 lb 6.4 oz (81.829 kg)  04/24/14 191 lb 2.2 oz (86.7 kg)     GEN: Well nourished, well developed, in no acute distress HEENT: normal Neck: no JVD,  no masses Cardiac:  Normal S1/S2, RRR; no murmur,  no rubs or gallops, no edema  Respiratory:  clear to auscultation bilaterally, no wheezing, rhonchi or rales. GI: soft, nontender, nondistended, + BS MS: no deformity or atrophy Skin: warm and dry  Neuro:  CNs II-XII intact, Strength and sensation are intact Psych: Normal affect   EKG:  EKG is ordered today.  It  demonstrates:   NSR, HR 72, no change from prior tracing   Recent Labs: 04/20/2014: ALT 33; TSH 1.65 04/26/2014: BUN 9; Creatinine 0.96; Hemoglobin 12.3*; Platelets 147*; Potassium 3.8; Sodium 140    Lipid Panel    Component Value Date/Time   CHOL 166 04/20/2014 0916   TRIG 193.0* 04/20/2014 0916   TRIG 130 12/08/2005 0741   HDL 47.10 04/20/2014 0916   CHOLHDL 4 04/20/2014 0916   CHOLHDL 3.4 CALC 12/08/2005 0741   VLDL 38.6 04/20/2014 0916   LDLCALC 80 04/20/2014 0916   LDLDIRECT 115.9 12/30/2011  0901      ASSESSMENT AND PLAN:  Coronary artery disease involving native coronary artery of native heart with angina pectoris:  He continues to have occasional/rare episodes of angina. These usually occur in the morning at rest. He denies exertional symptoms. His symptoms have clearly improved since PCI. I suspect his discomfort is related to residual distal PL branch stenosis. His symptoms are nitroglycerin responsive. He has not noticed much of a benefit with Ranexa. Ranexa is also not covered well on his insurance plan and would be quite expensive for him. I have asked him to stop Ranexa. I will increase his isosorbide to 90 mg daily at bedtime. If his blood pressure tends to run low with this, we can decrease his lisinopril to 2.5 mg daily. Otherwise continue aspirin, Lipitor, Coreg, Plavix, Imdur, lisinopril.  If he continues to have symptoms in follow-up, I will review further with Dr. Johnsie Cancel as to whether or not the distal PL branch stenosis could be approached with PCI.  Essential hypertension:  Controlled.  Dyslipidemia:  Continue statin.   Current medicines are reviewed at length with the patient today.  Concerns regarding medicines are as outlined above.  The following changes have been made:    DC Ranexa  Increase isosorbide 90 mg daily at bedtime   Labs/ tests ordered today include:   Orders Placed This Encounter  Procedures  . EKG 12-Lead    Disposition:   FU with Dr.  Jenkins Rouge 4 weeks   Signed, Versie Starks, MHS 06/19/2014 8:41 AM    [facility] [address] [place], [place], [place]  [address] Phone: [phone]; Fax: [phone]

## 2014-06-19 ENCOUNTER — Encounter: Payer: Self-pay | Admitting: Physician Assistant

## 2014-06-19 ENCOUNTER — Ambulatory Visit (INDEPENDENT_AMBULATORY_CARE_PROVIDER_SITE_OTHER): Payer: Medicare Other | Admitting: Physician Assistant

## 2014-06-19 VITALS — BP 109/70 | HR 72 | Ht 66.0 in | Wt 180.0 lb

## 2014-06-19 DIAGNOSIS — I25119 Atherosclerotic heart disease of native coronary artery with unspecified angina pectoris: Secondary | ICD-10-CM

## 2014-06-19 DIAGNOSIS — I10 Essential (primary) hypertension: Secondary | ICD-10-CM

## 2014-06-19 DIAGNOSIS — E785 Hyperlipidemia, unspecified: Secondary | ICD-10-CM

## 2014-06-19 DIAGNOSIS — I2510 Atherosclerotic heart disease of native coronary artery without angina pectoris: Secondary | ICD-10-CM | POA: Diagnosis not present

## 2014-06-19 MED ORDER — ISOSORBIDE MONONITRATE ER 60 MG PO TB24: 90.0000 mg | ORAL_TABLET | Freq: Every day | ORAL | Status: DC

## 2014-06-19 NOTE — Patient Instructions (Addendum)
Medication Instructions:  1. STOP RANEXA  2. INCREASE IMDUR TO 90 MG DAILY; NEW RX SENT IN  Labwork: NONE  Testing/Procedures: NONE  Follow-Up: WITH Jennessy Sandridge, [facility] 07/26/14 @ 8:30  Any Other Special Instructions Will Be Listed Below (If Applicable).  1. The goal for blood pressure is less than 140/90. 2. If you check your blood pressure and the top number (systolic reading) is less than 100 call us. 3. If your blood pressure is always above 140/90 (after checking several times over several days) call us. 4. If your blood pressure is greater than 160 (top number) or greater than 100 (bottom number), call us.

## 2014-07-03 ENCOUNTER — Ambulatory Visit: Payer: Medicare Other | Admitting: Physician Assistant

## 2014-07-10 ENCOUNTER — Other Ambulatory Visit: Payer: Self-pay

## 2014-07-25 NOTE — Progress Notes (Signed)
Cardiology Office Note   Date:  07/26/2014   ID:  Roy, [name] July 22, 1945, MRN [id number]  PCP:  Eliezer Lofts, MD  Cardiologist:  Dr. Jenkins Rouge     Chief Complaint  Patient presents with  . Coronary Artery Disease     History of Present Illness: Roy [name] is a 69 y.o. male with a hx of CAD s/p prior stent to the RCA in 2001, HTN, HL, OSA.  Admitted 4/16 with chest pain and STE during a stress test.  LHC demonstrated a subtotally occluded D2 which was treated with a Synergy DES.  There was a residual distal PLB 90% treated medically.  He was seen by [name], PA-C in FU on 05/24/14.  He was having some residual chest pain and Ranexa was started. I saw him in FU last month.  He continued to have occasional episodes of angina. This was described as anterior tightness with radiation to bilateral arms. He could take NTG with relief.  He had not noticed much of a benefit from Ranexa.  It was also too expensive.  I DC'd Ranexa and increased his nitrates.  He returns for FU.    Since last seen, he has been doing well. He denies any further chest discomfort. He walks his dog at least 20 minutes, 3 times a day. He denies chest symptoms with this. He denies significant dyspnea. He denies orthopnea, PND or edema. He denies syncope.   Studies/Reports Reviewed Today:  LHC/PCI 04/24/14 1. Left main; 20% distal   2. LAD: Very small D1 50-70% ostial, D2 subtotally occluded 3. LCx:  50-70% ostial stenosis.   4. RCA: Patent stent in the distal portion; 90% distal PLB 5. LVEF 35-40%  Severe anterolateral/anteroapical hypokinesia. PCI:  Synergy DES 2.25 x 20 mm to D2  Echo 04/25/14 - Mild LVH. EF 55% to 60%.  Wall motion was normal; Grade 1 diastolic dysfunction). - Mitral valve: Calcified annulus. Impressions:   Normal LV function; grade 1 diastolic dysfunction; trace MR and TR.    Past Medical History  Diagnosis Date  . CAD (coronary artery disease)     a.  stent to the  RCA in 2001;  b.  Negative Myoview in January 2012;  c.  LHC 4/16:  dLM 20, small D1 50-70, D2 sub-total, oLCx 50-70, RCA stent ok, dPLB 90, EF 35-40% >> DES to D2  . HTN (hypertension)   . HLD (hyperlipidemia)   . Depression   . Vertigo   . OSA (obstructive sleep apnea)   . Basal cell carcinoma     Bilateral Legs  . History of echocardiogram     a.  Echo 4/16: Mild LVH, EF 53-61%, grade 1 diastolic dysfunction, normal wall motion, MAC    Past Surgical History  Procedure Laterality Date  . Coronary stent placement  2001     RCA  . Tonsillectomy    . Left heart catheterization with coronary angiogram N/A 02/13/2012    Procedure: LEFT HEART CATHETERIZATION WITH CORONARY ANGIOGRAM;  Surgeon: [name], MD;  Location: [facility] CATH LAB;  Service: Cardiovascular;  Laterality: N/A;  . Cataract extraction, bilateral    . Left heart catheterization with coronary angiogram N/A 04/24/2014    Procedure: LEFT HEART CATHETERIZATION WITH CORONARY ANGIOGRAM;  Surgeon: [name], MD;  Location: [facility] CATH LAB;  Service: Cardiovascular;  Laterality: N/A;     Current Outpatient Prescriptions  Medication Sig Dispense Refill  . ALPRAZolam (XANAX) 0.25 MG tablet Take  0.25 mg by mouth at bedtime as needed for sleep.    Marland Kitchen aspirin EC 81 MG tablet Take 81 mg by mouth daily.    Marland Kitchen atorvastatin (LIPITOR) 80 MG tablet TAKE 1 TABLET (80 MG TOTAL) BY MOUTH DAILY. 90 tablet 1  . carvedilol (COREG) 12.5 MG tablet Take 1 tablet (12.5 mg total) by mouth 2 (two) times daily. 60 tablet 6  . clopidogrel (PLAVIX) 75 MG tablet Take 1 tablet (75 mg total) by mouth daily. 90 tablet 3  . eszopiclone (LUNESTA) 2 MG TABS Take 2 mg by mouth as needed. Take immediately before bedtime    . isosorbide mononitrate (IMDUR) 60 MG 24 hr tablet Take 1.5 tablets (90 mg total) by mouth at bedtime. 45 tablet 11  . lisinopril (PRINIVIL,ZESTRIL) 5 MG tablet TAKE 1 TABLET BY MOUTH DAILY 90 tablet 0  . Magnesium Oxide 200 MG TABS Take 200  mg by mouth daily.    . Multiple Vitamins-Minerals (CENTRUM SILVER PO) Take 1 tablet by mouth daily.     . nitroGLYCERIN (NITROSTAT) 0.4 MG SL tablet Place 1 tablet (0.4 mg total) under the tongue every 5 (five) minutes as needed. 25 tablet 6  . omeprazole (PRILOSEC) 20 MG capsule TAKE ONE CAPSULE BY MOUTH EVERY DAY 90 capsule 1  . venlafaxine XR (EFFEXOR-XR) 75 MG 24 hr capsule Take 1 capsule (75 mg total) by mouth daily. 90 capsule 3   No current facility-administered medications for this visit.    Allergies:   Review of patient's allergies indicates no known allergies.    Social History:  The patient  reports that he quit smoking about 14 years ago. His smoking use included Cigarettes. He has a 25 pack-year smoking history. He has never used smokeless tobacco. He reports that he drinks about 3.0 oz of alcohol per week. He reports that he does not use illicit drugs.   Family History:  The patient's family history includes Alcohol abuse in his mother; Cirrhosis in his mother; Depression in his sister; Heart disease in his father; Hypertension in his father; Peripheral vascular disease in his father. There is no history of Heart attack or Stroke.    ROS:   Please see the history of present illness.   Review of Systems  All other systems reviewed and are negative.    PHYSICAL EXAM: VS:  BP 115/72 mmHg  Pulse 70  Ht 5\' 6"  (1.676 m)  Wt 183 lb 12.8 oz (83.371 kg)  BMI 29.68 kg/m2    Wt Readings from Last 3 Encounters:  07/26/14 183 lb 12.8 oz (83.371 kg)  06/19/14 180 lb (81.647 kg)  05/24/14 180 lb 6.4 oz (81.829 kg)     GEN: Well nourished, well developed, in no acute distress HEENT: normal Neck: no JVD,  no masses Cardiac:  Normal S1/S2, RRR; no murmur,  no rubs or gallops, no edema   Respiratory:  clear to auscultation bilaterally, no wheezing, rhonchi or rales. GI: soft, nontender, nondistended, + BS MS: no deformity or atrophy Skin: warm and dry  Neuro:  CNs II-XII  intact, Strength and sensation are intact Psych: Normal affect   EKG:  EKG is ordered today.  It demonstrates:   NSR, HR 70, no change from last tracing.   Recent Labs: 04/20/2014: ALT 33; TSH 1.65 04/26/2014: BUN 9; Creatinine, Ser 0.96; Hemoglobin 12.3*; Platelets 147*; Potassium 3.8; Sodium 140    Lipid Panel    Component Value Date/Time   CHOL 166 04/20/2014 0916   TRIG  193.0* 04/20/2014 0916   TRIG 130 12/08/2005 0741   HDL 47.10 04/20/2014 0916   CHOLHDL 4 04/20/2014 0916   CHOLHDL 3.4 CALC 12/08/2005 0741   VLDL 38.6 04/20/2014 0916   LDLCALC 80 04/20/2014 0916   LDLDIRECT 115.9 12/30/2011 0901      ASSESSMENT AND PLAN:  1.  Coronary artery disease involving native coronary artery of native heart with angina pectoris:   Anginal symptoms have greatly improved. He is overall stable. Continue aspirin, Lipitor, Coreg, Plavix, Imdur, lisinopril.   2.  Essential hypertension:  Controlled. 3.  Dyslipidemia:  Continue statin.   Medication Adjustments: Current medicines are reviewed at length with the patient today.  Concerns regarding medicines are as outlined above.  The following changes have been made:   Discontinued Medications   No medications on file   Modified Medications   No medications on file   New Prescriptions   No medications on file    Labs/ tests ordered today include:   Orders Placed This Encounter  Procedures  . EKG 12-Lead    Disposition:   FU Dr. Jenkins Rouge Oct 2016   Signed, Pratt Bress Jorene Minors, [facility] 07/26/2014 9:02 AM    [facility] [place], [place], [place]  [address] Phone: [phone]; Fax: [phone]

## 2014-07-26 ENCOUNTER — Ambulatory Visit (INDEPENDENT_AMBULATORY_CARE_PROVIDER_SITE_OTHER): Payer: Medicare Other | Admitting: Physician Assistant

## 2014-07-26 ENCOUNTER — Encounter: Payer: Self-pay | Admitting: Physician Assistant

## 2014-07-26 VITALS — BP 115/72 | HR 70 | Ht 66.0 in | Wt 183.8 lb

## 2014-07-26 DIAGNOSIS — I10 Essential (primary) hypertension: Secondary | ICD-10-CM

## 2014-07-26 DIAGNOSIS — E785 Hyperlipidemia, unspecified: Secondary | ICD-10-CM

## 2014-07-26 DIAGNOSIS — I25119 Atherosclerotic heart disease of native coronary artery with unspecified angina pectoris: Secondary | ICD-10-CM | POA: Diagnosis not present

## 2014-07-26 NOTE — Patient Instructions (Signed)
Medication Instructions:  Your physician recommends that you continue on your current medications as directed. Please refer to the Current Medication list given to you today.   Labwork: NONE  Testing/Procedures: NONE  Follow-Up: Your physician wants you to follow-up in: 10/2014 WITH DR. Johnsie Cancel You will receive a reminder letter in the mail two months in advance. If you don't receive a letter, please call our office to schedule the follow-up appointment.   Any Other Special Instructions Will Be Listed Below (If Applicable).

## 2014-08-15 ENCOUNTER — Other Ambulatory Visit: Payer: Self-pay | Admitting: Cardiovascular Disease

## 2014-08-29 ENCOUNTER — Other Ambulatory Visit: Payer: Self-pay | Admitting: Cardiovascular Disease

## 2014-09-10 ENCOUNTER — Other Ambulatory Visit: Payer: Self-pay | Admitting: Cardiovascular Disease

## 2014-09-14 LAB — FECAL OCCULT BLOOD, GUAIAC: Fecal Occult Blood: NEGATIVE

## 2014-10-02 ENCOUNTER — Encounter: Payer: Self-pay | Admitting: Family Medicine

## 2014-11-10 ENCOUNTER — Other Ambulatory Visit: Payer: Self-pay | Admitting: Cardiovascular Disease

## 2014-11-11 ENCOUNTER — Other Ambulatory Visit: Payer: Self-pay | Admitting: Cardiovascular Disease

## 2014-11-13 ENCOUNTER — Encounter: Payer: Self-pay | Admitting: *Deleted

## 2014-11-14 NOTE — Progress Notes (Signed)
Patient ID: Roy [name], male   DOB: 1945/04/09, 69 y.o.   MRN: [id number]    Cardiology Office Note   Date:  11/16/2014   ID:  Roy, [name] [birth year], MRN [id number]  PCP:  Eliezer Lofts, MD  Cardiologist:  Dr. Jenkins Rouge     No chief complaint on file.    History of Present Illness: Roy [name] is a 69 y.o. male with a hx of CAD s/p prior stent to the RCA in 2001, HTN, HL, OSA.  Admitted 4/16 with chest pain and STE during a stress test.  LHC demonstrated a subtotally occluded D2 which was treated with a Synergy DES.  There was a residual distal PLB 90% treated medically.  He was seen by [name], PA-C in FU on 05/24/14.  He was having some residual chest pain and Ranexa was started. [name] PA saw him in FU  He continued to have occasional episodes of angina. This was described as anterior tightness with radiation to bilateral arms. He could take NTG with relief.  He had not noticed much of a benefit from Ranexa.  It was also too expensive.  I DC'd Ranexa and increased his nitrates.  He returns for FU.    Some cramps ? From statin  Started CoQ Infrequent angina  Dr Redmond Baseman to do a sinus procedure no general anesthesia   Since last seen, he has been doing well. He denies any further chest discomfort. He walks his dog at least 20 minutes, 3 times a day. He denies chest symptoms with this. He denies significant dyspnea. He denies orthopnea, PND or edema. He denies syncope.   Studies/Reports Reviewed Today:  LHC/PCI 04/24/14 1. Left main; 20% distal   2. LAD: Very small D1 50-70% ostial, D2 subtotally occluded 3. LCx:  50-70% ostial stenosis.   4. RCA: Patent stent in the distal portion; 90% distal PLB 5. LVEF 35-40%  Severe anterolateral/anteroapical hypokinesia. PCI:  Synergy DES 2.25 x 20 mm to D2  Echo 04/25/14 - Mild LVH. EF 55% to 60%.  Wall motion was normal; Grade 1 diastolic dysfunction). - Mitral valve: Calcified annulus. Impressions:   Normal  LV function; grade 1 diastolic dysfunction; trace MR and TR.    Past Medical History  Diagnosis Date  . CAD (coronary artery disease)     a.  stent to the RCA in 2001;  b.  Negative Myoview in January 2012;  c.  LHC 4/16:  dLM 20, small D1 50-70, D2 sub-total, oLCx 50-70, RCA stent ok, dPLB 90, EF 35-40% >> DES to D2  . HTN (hypertension)   . HLD (hyperlipidemia)   . Depression   . Vertigo   . OSA (obstructive sleep apnea)   . Basal cell carcinoma     Bilateral Legs  . History of echocardiogram     a.  Echo 4/16: Mild LVH, EF 78-29%, grade 1 diastolic dysfunction, normal wall motion, MAC    Past Surgical History  Procedure Laterality Date  . Coronary stent placement  2001     RCA  . Tonsillectomy    . Left heart catheterization with coronary angiogram N/A 02/13/2012    Procedure: LEFT HEART CATHETERIZATION WITH CORONARY ANGIOGRAM;  Surgeon: Roy Mckelvy M Martinique, MD;  Location: [facility] CATH LAB;  Service: Cardiovascular;  Laterality: N/A;  . Cataract extraction, bilateral    . Left heart catheterization with coronary angiogram N/A 04/24/2014    Procedure: LEFT HEART CATHETERIZATION WITH CORONARY ANGIOGRAM;  Surgeon: Roy Palau  Adora Fridge, MD;  Location: [facility] CATH LAB;  Service: Cardiovascular;  Laterality: N/A;     Current Outpatient Prescriptions  Medication Sig Dispense Refill  . ALPRAZolam (XANAX) 0.25 MG tablet Take 0.25 mg by mouth at bedtime as needed for sleep.    Marland Kitchen aspirin EC 81 MG tablet Take 81 mg by mouth daily.    Marland Kitchen atorvastatin (LIPITOR) 80 MG tablet TAKE 1 TABLET (80 MG TOTAL) BY MOUTH DAILY. 90 tablet 0  . carvedilol (COREG) 12.5 MG tablet Take 1 tablet (12.5 mg total) by mouth 2 (two) times daily. 60 tablet 6  . clopidogrel (PLAVIX) 75 MG tablet TAKE 1 TABLET BY MOUTH DAILY 90 tablet 0  . Coenzyme Q10 (CO Q 10 PO) Take 1 tablet by mouth daily.    . eszopiclone (LUNESTA) 2 MG TABS Take 2 mg by mouth daily as needed for sleep. Take immediately before bedtime    . isosorbide  mononitrate (IMDUR) 60 MG 24 hr tablet Take 1.5 tablets (90 mg total) by mouth at bedtime. 45 tablet 11  . lisinopril (PRINIVIL,ZESTRIL) 5 MG tablet TAKE 1 TABLET BY MOUTH DAILY 90 tablet 0  . Magnesium Oxide 200 MG TABS Take 200 mg by mouth daily.    . Multiple Vitamins-Minerals (CENTRUM SILVER PO) Take 1 tablet by mouth daily.     . nitroGLYCERIN (NITROSTAT) 0.4 MG SL tablet Place 0.4 mg under the tongue every 5 (five) minutes as needed for chest pain (3 DOSES MAX).    Marland Kitchen omeprazole (PRILOSEC) 20 MG capsule TAKE ONE CAPSULE BY MOUTH EVERY DAY 90 capsule 1  . venlafaxine XR (EFFEXOR-XR) 75 MG 24 hr capsule Take 1 capsule (75 mg total) by mouth daily. 90 capsule 3   No current facility-administered medications for this visit.    Allergies:   Review of patient's allergies indicates no known allergies.    Social History:  The patient  reports that he quit smoking about 14 years ago. His smoking use included Cigarettes. He has a 25 pack-year smoking history. He has never used smokeless tobacco. He reports that he drinks about 3.0 oz of alcohol per week. He reports that he does not use illicit drugs.   Family History:  The patient's family history includes Alcohol abuse in his mother; Cirrhosis in his mother; Depression in his sister; Heart disease in his father; Hypertension in his father; Peripheral vascular disease in his father. There is no history of Heart attack or Stroke.    ROS:   Please see the history of present illness.   Review of Systems  All other systems reviewed and are negative.    PHYSICAL EXAM: VS:  BP 102/70 mmHg  Pulse 96  Ht 5\' 6"  (1.676 m)  Wt 82.918 kg (182 lb 12.8 oz)  BMI 29.52 kg/m2    Wt Readings from Last 3 Encounters:  11/16/14 82.918 kg (182 lb 12.8 oz)  07/26/14 83.371 kg (183 lb 12.8 oz)  06/19/14 81.647 kg (180 lb)     GEN: Well nourished, well developed, in no acute distress HEENT: normal Neck: no JVD,  no masses Cardiac:  Normal S1/S2, RRR; no  murmur,  no rubs or gallops, no edema   Respiratory:  clear to auscultation bilaterally, no wheezing, rhonchi or rales. GI: soft, nontender, nondistended, + BS MS: no deformity or atrophy Skin: warm and dry  Neuro:  CNs II-XII intact, Strength and sensation are intact Psych: Normal affect   EKG:  07/26/14    NSR, HR 70, no  change from last tracing.   Recent Labs: 04/20/2014: ALT 33; TSH 1.65 04/26/2014: BUN 9; Creatinine, Ser 0.96; Hemoglobin 12.3*; Platelets 147*; Potassium 3.8; Sodium 140    Lipid Panel    Component Value Date/Time   CHOL 166 04/20/2014 0916   TRIG 193.0* 04/20/2014 0916   TRIG 130 12/08/2005 0741   HDL 47.10 04/20/2014 0916   CHOLHDL 4 04/20/2014 0916   CHOLHDL 3.4 CALC 12/08/2005 0741   VLDL 38.6 04/20/2014 0916   LDLCALC 80 04/20/2014 0916   LDLDIRECT 115.9 12/30/2011 0901      ASSESSMENT AND PLAN:  1.  Coronary artery disease involving native coronary artery of native heart with angina pectoris:   Anginal symptoms have greatly improved. He is overall stable. Continue aspirin, Lipitor, Coreg, Plavix, Imdur, lisinopril.  Ok to have sinus surgery.   2.  Essential hypertension:  Controlled. 3.  Dyslipidemia:  Continue statin. Check lipids today if LDL well under 100 can lower lipitor to 40 mg to help with side effects    Medication Adjustments: Current medicines are reviewed at length with the patient today.  Concerns regarding medicines are as outlined above.  The following changes have been made:   Discontinued Medications   No medications on file   Modified Medications   No medications on file   New Prescriptions   No medications on file    Labs/ tests ordered today include:   No orders of the defined types were placed in this encounter.    Disposition:   FU with me in 3 months    Jenkins Rouge

## 2014-11-16 ENCOUNTER — Ambulatory Visit (INDEPENDENT_AMBULATORY_CARE_PROVIDER_SITE_OTHER): Payer: Medicare Other | Admitting: Cardiovascular Disease

## 2014-11-16 ENCOUNTER — Encounter: Payer: Self-pay | Admitting: Cardiovascular Disease

## 2014-11-16 VITALS — BP 102/70 | HR 96 | Ht 66.0 in | Wt 182.8 lb

## 2014-11-16 DIAGNOSIS — I10 Essential (primary) hypertension: Secondary | ICD-10-CM

## 2014-11-16 DIAGNOSIS — E785 Hyperlipidemia, unspecified: Secondary | ICD-10-CM

## 2014-11-16 LAB — LIPID PANEL
CHOL/HDL RATIO: 2.5 ratio (ref <=5.0)
Cholesterol: 153 mg/dL (ref 125–200)
HDL: 62 mg/dL (ref >=40)
LDL Cholesterol: 55 mg/dL (ref <130)
TRIGLYCERIDES: 179 mg/dL — AB (ref <150)
VLDL: 36 mg/dL — AB (ref <30)

## 2014-11-16 LAB — HEPATIC FUNCTION PANEL
ALT: 47 U/L — AB (ref 9–46)
AST: 38 U/L — ABNORMAL HIGH (ref 10–35)
Albumin: 3.8 g/dL (ref 3.6–5.1)
Alkaline Phosphatase: 63 U/L (ref 40–115)
Bilirubin, Direct: 0.1 mg/dL (ref <=0.2)
Indirect Bilirubin: 0.4 mg/dL (ref 0.2–1.2)
TOTAL PROTEIN: 6.6 g/dL (ref 6.1–8.1)
Total Bilirubin: 0.5 mg/dL (ref 0.2–1.2)

## 2014-11-16 NOTE — Patient Instructions (Signed)
Medication Instructions:  Your physician recommends that you continue on your current medications as directed. Please refer to the Current Medication list given to you today.   Labwork: Lipid and Liver panel today  Testing/Procedures: None ordered  Follow-Up: Your physician recommends that you schedule a follow-up appointment in: 3 months with Dr.Nishan   Any Other Special Instructions Will Be Listed Below (If Applicable).     If you need a refill on your cardiac medications before your next appointment, please call your pharmacy.

## 2014-11-17 ENCOUNTER — Telehealth: Payer: Self-pay | Admitting: Cardiovascular Disease

## 2014-11-17 ENCOUNTER — Other Ambulatory Visit: Payer: Self-pay | Admitting: *Deleted

## 2014-11-17 DIAGNOSIS — E785 Hyperlipidemia, unspecified: Secondary | ICD-10-CM

## 2014-11-17 DIAGNOSIS — Z79899 Other long term (current) drug therapy: Secondary | ICD-10-CM

## 2014-11-17 NOTE — Telephone Encounter (Signed)
PT AWARE OF LAB RESULTS./CY 

## 2014-11-17 NOTE — Telephone Encounter (Signed)
New message  ° ° °Patient calling back for test results.  °

## 2014-11-18 ENCOUNTER — Other Ambulatory Visit: Payer: Self-pay | Admitting: Cardiovascular Disease

## 2014-11-19 ENCOUNTER — Encounter: Payer: Self-pay | Admitting: Cardiovascular Disease

## 2014-11-20 ENCOUNTER — Other Ambulatory Visit: Payer: Self-pay | Admitting: *Deleted

## 2014-11-20 MED ORDER — ATORVASTATIN CALCIUM 40 MG PO TABS: 40.0000 mg | ORAL_TABLET | Freq: Every day | ORAL | Status: DC

## 2014-11-30 ENCOUNTER — Other Ambulatory Visit: Payer: Self-pay | Admitting: Cardiovascular Disease

## 2014-12-10 ENCOUNTER — Other Ambulatory Visit: Payer: Self-pay | Admitting: Cardiovascular Disease

## 2014-12-17 ENCOUNTER — Other Ambulatory Visit: Payer: Self-pay | Admitting: Cardiovascular Disease

## 2015-01-18 ENCOUNTER — Other Ambulatory Visit: Payer: Self-pay | Admitting: Cardiovascular Disease

## 2015-01-26 ENCOUNTER — Encounter: Payer: Self-pay | Admitting: Cardiovascular Disease

## 2015-02-23 ENCOUNTER — Other Ambulatory Visit: Payer: Self-pay | Admitting: Family Medicine

## 2015-03-14 ENCOUNTER — Encounter: Payer: Self-pay | Admitting: Cardiovascular Disease

## 2015-03-20 ENCOUNTER — Other Ambulatory Visit: Payer: Self-pay | Admitting: Family Medicine

## 2015-03-20 MED ORDER — VENLAFAXINE HCL ER 75 MG PO CP24: 75.0000 mg | ORAL_CAPSULE | Freq: Every day | ORAL | Status: DC

## 2015-03-27 ENCOUNTER — Encounter: Payer: Self-pay | Admitting: Cardiovascular Disease

## 2015-03-30 ENCOUNTER — Other Ambulatory Visit (INDEPENDENT_AMBULATORY_CARE_PROVIDER_SITE_OTHER): Payer: Medicare HMO | Admitting: *Deleted

## 2015-03-30 DIAGNOSIS — Z79899 Other long term (current) drug therapy: Secondary | ICD-10-CM

## 2015-03-30 DIAGNOSIS — E785 Hyperlipidemia, unspecified: Secondary | ICD-10-CM

## 2015-03-30 LAB — LIPID PANEL
Cholesterol: 196 mg/dL (ref 125–200)
HDL: 54 mg/dL (ref >=40)
LDL CALC: 97 mg/dL (ref <130)
Total CHOL/HDL Ratio: 3.6 Ratio (ref <=5.0)
Triglycerides: 225 mg/dL — ABNORMAL HIGH (ref <150)
VLDL: 45 mg/dL — ABNORMAL HIGH (ref <30)

## 2015-03-30 LAB — HEPATIC FUNCTION PANEL
ALBUMIN: 4.2 g/dL (ref 3.6–5.1)
ALK PHOS: 65 U/L (ref 40–115)
ALT: 42 U/L (ref 9–46)
AST: 25 U/L (ref 10–35)
BILIRUBIN INDIRECT: 0.5 mg/dL (ref 0.2–1.2)
Bilirubin, Direct: 0.1 mg/dL (ref <=0.2)
Total Bilirubin: 0.6 mg/dL (ref 0.2–1.2)
Total Protein: 6.8 g/dL (ref 6.1–8.1)

## 2015-03-31 NOTE — Progress Notes (Signed)
Patient ID: Roy [name], male   DOB: [birth year], 70 y.o.   MRN: [id number]    Cardiology Office Note   Date:  04/02/2015   ID:  Roy [name] Apr 22, 1945, MRN [id number]  PCP:  Roy Lofts, MD  Cardiologist:  Roy [name]     Chief Complaint  Patient presents with  . Coronary Artery Disease    taking 4 nitros since last ov, no activie cp today, per pt     History of Present Illness: Roy [name] is a 70 y.o. male with a hx of CAD s/p prior stent to the RCA in 2001, HTN, HL, OSA.  Admitted 4/16 with chest pain and STE during a stress test.  LHC demonstrated a subtotally occluded D2 which was treated with a Synergy DES.  There was a residual distal PLB 90% treated medically.  He was seen by Roy Ransom, PA-C in FU on 05/24/14.  He was having some residual chest pain and Ranexa was started. Roy Dopp PA saw him in FU  He continued to have occasional episodes of angina. This was described as anterior tightness with radiation to bilateral arms. He could take NTG with relief.  He had not noticed much of a benefit from Ranexa.  It was also too expensive.  I DC'd Ranexa and increased his nitrates.  He returns for FU.    Some cramps ? From statin  Started CoQ  Since last seen, he has been doing well. He denies any further chest discomfort. He walks his dog at least 20 minutes, 3 times a day. He denies chest symptoms with this. He denies significant dyspnea. He denies orthopnea, PND or edema. He denies syncope.  Was tachycardic in office today ECG sinus Taking coreg  History of anemia  No symptoms   Studies/Reports Reviewed Today:  LHC/PCI 04/24/14 1. Left main; 20% distal   2. LAD: Very small D1 50-70% ostial, D2 subtotally occluded 3. LCx:  50-70% ostial stenosis.   4. RCA: Patent stent in the distal portion; 90% distal PLB 5. LVEF 35-40%  Severe anterolateral/anteroapical hypokinesia. PCI:  Synergy DES 2.25 x 20 mm to D2  Echo 04/25/14 - Mild LVH. EF 55% to 60%.   Wall motion was normal; Grade 1 diastolic dysfunction). - Mitral valve: Calcified annulus. Impressions:   Normal LV function; grade 1 diastolic dysfunction; trace MR and TR.    Past Medical History  Diagnosis Date  . CAD (coronary artery disease)     a.  stent to the RCA in 2001;  b.  Negative Myoview in January 2012;  c.  LHC 4/16:  dLM 20, small D1 50-70, D2 sub-total, oLCx 50-70, RCA stent ok, dPLB 90, EF 35-40% >> DES to D2  . HTN (hypertension)   . HLD (hyperlipidemia)   . Depression   . Vertigo   . OSA (obstructive sleep apnea)   . Basal cell carcinoma     Bilateral Legs  . History of echocardiogram     a.  Echo 4/16: Mild LVH, EF 0000000, grade 1 diastolic dysfunction, normal wall motion, MAC    Past Surgical History  Procedure Laterality Date  . Coronary stent placement  2001     RCA  . Tonsillectomy    . Left heart catheterization with coronary angiogram N/A 02/13/2012    Procedure: LEFT HEART CATHETERIZATION WITH CORONARY ANGIOGRAM;  Surgeon: Roy Mccann M Martinique, MD;  Location: [facility] CATH LAB;  Service: Cardiovascular;  Laterality: N/A;  . Cataract extraction, bilateral    .  Left heart catheterization with coronary angiogram N/A 04/24/2014    Procedure: LEFT HEART CATHETERIZATION WITH CORONARY ANGIOGRAM;  Surgeon: [name], MD;  Location: [facility] CATH LAB;  Service: Cardiovascular;  Laterality: N/A;     Current Outpatient Prescriptions  Medication Sig Dispense Refill  . ALPRAZolam (XANAX) 0.25 MG tablet Take 0.25 mg by mouth at bedtime as needed for sleep.    Marland Kitchen aspirin EC 81 MG tablet Take 81 mg by mouth daily.    Marland Kitchen atorvastatin (LIPITOR) 40 MG tablet Take 1 tablet (40 mg total) by mouth at bedtime. 90 tablet 3  . carvedilol (COREG) 12.5 MG tablet TAKE 1 TABLET (12.5 MG TOTAL) BY MOUTH 2 (TWO) TIMES DAILY. 60 tablet 2  . clopidogrel (PLAVIX) 75 MG tablet Take 1 tablet (75 mg total) by mouth daily. 90 tablet 3  . Coenzyme Q10 (CO Q 10 PO) Take 1 tablet by mouth daily.    .  eszopiclone (LUNESTA) 2 MG TABS Take 2 mg by mouth daily as needed for sleep. Take immediately before bedtime    . Isosorbide Mononitrate (IMDUR PO) Take 90 mg by mouth daily.    Marland Kitchen lisinopril (PRINIVIL,ZESTRIL) 5 MG tablet TAKE 1 TABLET BY MOUTH DAILY 90 tablet 3  . Magnesium Oxide 200 MG TABS Take 200 mg by mouth daily.    . Multiple Vitamins-Minerals (CENTRUM SILVER PO) Take 1 tablet by mouth daily.     . nitroGLYCERIN (NITROSTAT) 0.4 MG SL tablet Place 0.4 mg under the tongue every 5 (five) minutes as needed for chest pain (3 DOSES MAX).    Marland Kitchen omeprazole (PRILOSEC) 20 MG capsule TAKE ONE CAPSULE BY MOUTH EVERY DAY 90 capsule 3  . venlafaxine XR (EFFEXOR-XR) 75 MG 24 hr capsule Take 1 capsule (75 mg total) by mouth daily with breakfast. NEEDS OFFICE VISIT 90 capsule 0   No current facility-administered medications for this visit.    Allergies:   Review of patient's allergies indicates no known allergies.    Social History:  The patient  reports that he quit smoking about 15 years ago. His smoking use included Cigarettes. He has a 25 pack-year smoking history. He has never used smokeless tobacco. He reports that he drinks about 3.0 oz of alcohol per week. He reports that he does not use illicit drugs.   Family History:  The patient's family history includes Alcohol abuse in his mother; Cirrhosis in his mother; Depression in his sister; Heart disease in his father; Hypertension in his father; Peripheral vascular disease in his father. There is no history of Heart attack or Stroke.    ROS:   Please see the history of present illness.   Review of Systems  All other systems reviewed and are negative.    PHYSICAL EXAM: VS:  BP 130/92 mmHg  Pulse 109  Ht 5\' 6"  (1.676 m)  Wt 83.553 kg (184 lb 3.2 oz)  BMI 29.75 kg/m2  SpO2 95%    Wt Readings from Last 3 Encounters:  04/02/15 83.553 kg (184 lb 3.2 oz)  11/16/14 82.918 kg (182 lb 12.8 oz)  07/26/14 83.371 kg (183 lb 12.8 oz)     GEN:  Well nourished, well developed, in no acute distress HEENT: normal Neck: no JVD,  no masses Cardiac:  Normal S1/S2, RRR; no murmur,  no rubs or gallops, no edema   Respiratory:  clear to auscultation bilaterally, no wheezing, rhonchi or rales. GI: soft, nontender, nondistended, + BS MS: no deformity or atrophy Skin: warm and dry  Neuro:  CNs II-XII intact, Strength and sensation are intact Psych: Normal affect   EKG:  07/26/14    NSR, HR 70, no change from last tracing.   04/02/15  ST rate 111 LAFB  Poor R wave progression  Recent Labs: 04/20/2014: TSH 1.65 04/26/2014: BUN 9; Creatinine, Ser 0.96; Hemoglobin 12.3*; Platelets 147*; Potassium 3.8; Sodium 140 03/30/2015: ALT 42    Lipid Panel    Component Value Date/Time   CHOL 196 03/30/2015 1036   TRIG 225* 03/30/2015 1036   TRIG 130 12/08/2005 0741   HDL 54 03/30/2015 1036   CHOLHDL 3.6 03/30/2015 1036   CHOLHDL 3.4 CALC 12/08/2005 0741   VLDL 45* 03/30/2015 1036   LDLCALC 97 03/30/2015 1036   LDLDIRECT 115.9 12/30/2011 0901      ASSESSMENT AND PLAN:  1.  Coronary artery disease involving native coronary artery of native heart with angina pectoris:   Anginal symptoms have greatly improved. He is overall stable. Continue aspirin, Lipitor, Coreg, Plavix, Imdur, lisinopril.  2.  Essential hypertension:  Controlled. 3.  Dyslipidemia:  Continue statin. Check lipids today if LDL well under 100 can lower lipitor to 40 mg to help with side effects  4. Tachycardia:  Etiology not clear.  Check Hb/Hct  Bmet and TSH  F/u echo make sure EF not decreased f/u with me next available may need monitor    Medication Adjustments: Current medicines are reviewed at length with the patient today.  Concerns regarding medicines are as outlined above.  The following changes have been made:   Discontinued Medications   ISOSORBIDE MONONITRATE (IMDUR) 60 MG 24 HR TABLET    Take 1.5 tablets (90 mg total) by mouth at bedtime.   Modified Medications   No  medications on file   New Prescriptions   No medications on file    Labs/ tests ordered today include:   Echo , Labs ,   Disposition:   FU with me next available    [employer]

## 2015-04-02 ENCOUNTER — Other Ambulatory Visit: Payer: Self-pay | Admitting: Cardiovascular Disease

## 2015-04-02 ENCOUNTER — Ambulatory Visit (INDEPENDENT_AMBULATORY_CARE_PROVIDER_SITE_OTHER): Payer: Medicare HMO | Admitting: Cardiovascular Disease

## 2015-04-02 ENCOUNTER — Encounter: Payer: Self-pay | Admitting: Cardiovascular Disease

## 2015-04-02 VITALS — BP 130/92 | HR 109 | Ht 66.0 in | Wt 184.2 lb

## 2015-04-02 DIAGNOSIS — Z79899 Other long term (current) drug therapy: Secondary | ICD-10-CM

## 2015-04-02 DIAGNOSIS — R000 Tachycardia, unspecified: Secondary | ICD-10-CM

## 2015-04-02 DIAGNOSIS — I2510 Atherosclerotic heart disease of native coronary artery without angina pectoris: Secondary | ICD-10-CM | POA: Diagnosis not present

## 2015-04-02 DIAGNOSIS — I25119 Atherosclerotic heart disease of native coronary artery with unspecified angina pectoris: Secondary | ICD-10-CM

## 2015-04-02 DIAGNOSIS — I209 Angina pectoris, unspecified: Secondary | ICD-10-CM | POA: Diagnosis not present

## 2015-04-02 LAB — BASIC METABOLIC PANEL
BUN: 17 mg/dL (ref 7–25)
CHLORIDE: 101 mmol/L (ref 98–110)
CO2: 24 mmol/L (ref 20–31)
CREATININE: 1.11 mg/dL (ref 0.70–1.25)
Calcium: 9.5 mg/dL (ref 8.6–10.3)
Glucose, Bld: 108 mg/dL — ABNORMAL HIGH (ref 65–99)
Potassium: 4.4 mmol/L (ref 3.5–5.3)
Sodium: 141 mmol/L (ref 135–146)

## 2015-04-02 LAB — TSH: TSH: 1.82 m[IU]/L (ref 0.40–4.50)

## 2015-04-02 LAB — HEMOGLOBIN: HEMOGLOBIN: 15.2 g/dL (ref 13.0–17.0)

## 2015-04-02 MED ORDER — NITROGLYCERIN 0.4 MG SL SUBL: 0.4000 mg | SUBLINGUAL_TABLET | SUBLINGUAL | Status: DC | PRN

## 2015-04-02 MED ORDER — CARVEDILOL 12.5 MG PO TABS: ORAL_TABLET | ORAL | Status: DC

## 2015-04-02 NOTE — Patient Instructions (Addendum)
Medication Instructions:  Your physician recommends that you continue on your current medications as directed. Please refer to the Current Medication list given to you today.  Labwork: Your physician recommends that you have lab work today Hemigoblin, BMET, TSH  Your physician recommends that you return for lab work in: 6 months for lipid and liver. This is fasting labs, so nothing to drink or eat after midnight.    Testing/Procedures: Your physician has requested that you have an echocardiogram. Echocardiography is a painless test that uses sound waves to create images of your heart. It provides your doctor with information about the size and shape of your heart and how well your heart's chambers and valves are working. This procedure takes approximately one hour. There are no restrictions for this procedure.  Follow-Up: Your physician wants you to follow-up in: 6 months with Dr. Johnsie Cancel. You will receive a reminder letter in the mail two months in advance. If you don't receive a letter, please call our office to schedule the follow-up appointment.  If you need a refill on your cardiac medications before your next appointment, please call your pharmacy.

## 2015-04-19 ENCOUNTER — Ambulatory Visit (HOSPITAL_COMMUNITY): Payer: Medicare HMO | Attending: Cardiology

## 2015-04-19 ENCOUNTER — Other Ambulatory Visit: Payer: Self-pay

## 2015-04-19 DIAGNOSIS — I25119 Atherosclerotic heart disease of native coronary artery with unspecified angina pectoris: Secondary | ICD-10-CM | POA: Insufficient documentation

## 2015-04-19 DIAGNOSIS — I119 Hypertensive heart disease without heart failure: Secondary | ICD-10-CM | POA: Insufficient documentation

## 2015-04-19 DIAGNOSIS — R000 Tachycardia, unspecified: Secondary | ICD-10-CM | POA: Diagnosis not present

## 2015-04-19 DIAGNOSIS — G4733 Obstructive sleep apnea (adult) (pediatric): Secondary | ICD-10-CM | POA: Insufficient documentation

## 2015-04-19 DIAGNOSIS — I2510 Atherosclerotic heart disease of native coronary artery without angina pectoris: Secondary | ICD-10-CM | POA: Diagnosis present

## 2015-05-17 ENCOUNTER — Encounter: Payer: Self-pay | Admitting: Family Medicine

## 2015-05-19 ENCOUNTER — Encounter: Payer: Self-pay | Admitting: Family Medicine

## 2015-05-19 DIAGNOSIS — Z125 Encounter for screening for malignant neoplasm of prostate: Secondary | ICD-10-CM

## 2015-05-25 ENCOUNTER — Telehealth: Payer: Self-pay | Admitting: Family Medicine

## 2015-05-25 ENCOUNTER — Other Ambulatory Visit (INDEPENDENT_AMBULATORY_CARE_PROVIDER_SITE_OTHER): Payer: Medicare HMO

## 2015-05-25 ENCOUNTER — Other Ambulatory Visit: Payer: Medicare HMO

## 2015-05-25 ENCOUNTER — Ambulatory Visit: Payer: Medicare HMO | Admitting: Family Medicine

## 2015-05-25 ENCOUNTER — Other Ambulatory Visit: Payer: Self-pay | Admitting: Family Medicine

## 2015-05-25 ENCOUNTER — Encounter: Payer: Self-pay | Admitting: Family Medicine

## 2015-05-25 ENCOUNTER — Ambulatory Visit (INDEPENDENT_AMBULATORY_CARE_PROVIDER_SITE_OTHER): Payer: Medicare HMO | Admitting: Family Medicine

## 2015-05-25 VITALS — BP 122/74 | HR 81 | Temp 98.2°F | Ht 66.0 in | Wt 188.5 lb

## 2015-05-25 DIAGNOSIS — K625 Hemorrhage of anus and rectum: Secondary | ICD-10-CM | POA: Diagnosis not present

## 2015-05-25 DIAGNOSIS — I10 Essential (primary) hypertension: Secondary | ICD-10-CM | POA: Diagnosis not present

## 2015-05-25 DIAGNOSIS — E785 Hyperlipidemia, unspecified: Secondary | ICD-10-CM

## 2015-05-25 DIAGNOSIS — Z125 Encounter for screening for malignant neoplasm of prostate: Secondary | ICD-10-CM

## 2015-05-25 DIAGNOSIS — Z1159 Encounter for screening for other viral diseases: Secondary | ICD-10-CM

## 2015-05-25 DIAGNOSIS — K5900 Constipation, unspecified: Secondary | ICD-10-CM

## 2015-05-25 DIAGNOSIS — K648 Other hemorrhoids: Secondary | ICD-10-CM

## 2015-05-25 LAB — LIPID PANEL
CHOL/HDL RATIO: 4
CHOLESTEROL: 187 mg/dL (ref 0–200)
HDL: 47.1 mg/dL (ref >39.00)
NonHDL: 139.47
TRIGLYCERIDES: 367 mg/dL — AB (ref 0.0–149.0)
VLDL: 73.4 mg/dL — AB (ref 0.0–40.0)

## 2015-05-25 LAB — COMPREHENSIVE METABOLIC PANEL
ALBUMIN: 4.2 g/dL (ref 3.5–5.2)
ALK PHOS: 62 U/L (ref 39–117)
ALT: 33 U/L (ref 0–53)
AST: 18 U/L (ref 0–37)
BUN: 16 mg/dL (ref 6–23)
CHLORIDE: 105 meq/L (ref 96–112)
CO2: 29 mEq/L (ref 19–32)
Calcium: 9.5 mg/dL (ref 8.4–10.5)
Creatinine, Ser: 1.03 mg/dL (ref 0.40–1.50)
GFR: 76.04 mL/min (ref >60.00)
Glucose, Bld: 129 mg/dL — ABNORMAL HIGH (ref 70–99)
POTASSIUM: 5.5 meq/L — AB (ref 3.5–5.1)
Sodium: 142 mEq/L (ref 135–145)
TOTAL PROTEIN: 6.7 g/dL (ref 6.0–8.3)
Total Bilirubin: 0.4 mg/dL (ref 0.2–1.2)

## 2015-05-25 LAB — CBC WITH DIFFERENTIAL/PLATELET
BASOS PCT: 0.3 % (ref 0.0–3.0)
Basophils Absolute: 0 10*3/uL (ref 0.0–0.1)
EOS PCT: 2.5 % (ref 0.0–5.0)
Eosinophils Absolute: 0.2 10*3/uL (ref 0.0–0.7)
HCT: 42 % (ref 39.0–52.0)
HEMOGLOBIN: 14.5 g/dL (ref 13.0–17.0)
LYMPHS ABS: 2.1 10*3/uL (ref 0.7–4.0)
Lymphocytes Relative: 25 % (ref 12.0–46.0)
MCHC: 34.5 g/dL (ref 30.0–36.0)
MCV: 92.8 fl (ref 78.0–100.0)
MONO ABS: 0.9 10*3/uL (ref 0.1–1.0)
MONOS PCT: 10.3 % (ref 3.0–12.0)
NEUTROS PCT: 61.9 % (ref 43.0–77.0)
Neutro Abs: 5.1 10*3/uL (ref 1.4–7.7)
Platelets: 211 10*3/uL (ref 150.0–400.0)
RBC: 4.53 Mil/uL (ref 4.22–5.81)
RDW: 13 % (ref 11.5–15.5)
WBC: 8.3 10*3/uL (ref 4.0–10.5)

## 2015-05-25 LAB — PSA, MEDICARE: PSA: 0.8 ng/ml (ref 0.10–4.00)

## 2015-05-25 LAB — LDL CHOLESTEROL, DIRECT: Direct LDL: 85 mg/dL

## 2015-05-25 MED ORDER — HYDROCORTISONE ACETATE 25 MG RE SUPP: 25.0000 mg | Freq: Every day | RECTAL | Status: DC

## 2015-05-25 NOTE — Telephone Encounter (Signed)
-----   Message from Marchia Bond sent at 05/24/2015  2:27 PM EDT ----- Regarding: Cpx labs tomorrow 5/12, need orders. Thanks! :-) Please order  future cpx labs for pt's upcoming lab appt. Thanks Aniceto Boss

## 2015-05-25 NOTE — Assessment & Plan Note (Signed)
Treat with miralax and increase in fiber, water and exercise.

## 2015-05-25 NOTE — Progress Notes (Signed)
Pre visit review using our clinic review tool, if applicable. No additional management support is needed unless otherwise documented below in the visit note. 

## 2015-05-25 NOTE — Assessment & Plan Note (Signed)
Most likely the cause of the bleeding. Treat with steroid suppository x several days. Restart anticoagulation as soon as able. Treat constipation to prevent further issue.

## 2015-05-25 NOTE — Assessment & Plan Note (Signed)
Anoscopy performed and found int hemorrhoids.

## 2015-05-25 NOTE — Progress Notes (Signed)
   Subjective:    Patient ID: Roy [name], male    DOB: Dec 12, 1945, 70 y.o.   MRN: [id number]  HPI   70 year old male with history of CAD, HTN, GERD and sleep apnea presetns with 1 month of intermittent bright red blood in stool. Drips on toiulet tissues and in commode He reports he has been more constipated in last  1.5 month as well. He has occ straining with BM, pushes a lot. No longer having BM daily.  Has tried fiber laxatives, stool softners off and on. Occ abdominal discomfort, heaviness.. Feels like he has to have BM. Not relieved with BM.  Occ pain with BM.Marland Kitchen Using intermittant OTC hemorrhoid med.  No change in diet  Has also noted some dribbling of urine after urination. Has been more tired lately. No chest pain with exertion.  It stopped temporarily off  ASA and plavix since 05/19/2015. No recent new meds except co enzyme Q 10,no ibuprofen , no aleve. Does drink wine occ 3 at a time.  Colonoscopy: never had a colonoscopy.  Social History /Family History/Past Medical History reviewed and updated if needed. No family history of colon cancer.   Review of Systems  Constitutional: Positive for fatigue. Negative for fever.  HENT: Negative for ear pain.   Eyes: Negative for pain.  Respiratory: Negative for cough and shortness of breath.   Cardiovascular: Negative for chest pain and leg swelling.       Objective:   Physical Exam  Constitutional: Vital signs are normal. He appears well-developed and well-nourished.  HENT:  Head: Normocephalic.  Right Ear: Hearing normal.  Left Ear: Hearing normal.  Nose: Nose normal.  Mouth/Throat: Oropharynx is clear and moist and mucous membranes are normal.  Neck: Trachea normal. Carotid bruit is not present. No thyroid mass and no thyromegaly present.  Cardiovascular: Normal rate, regular rhythm and normal pulses.  Exam reveals no gallop, no distant heart sounds and no friction rub.   No murmur heard. No peripheral edema    Pulmonary/Chest: Effort normal and breath sounds normal. No respiratory distress.  Genitourinary: Rectal exam shows internal hemorrhoid. Rectal exam shows no external hemorrhoid, no fissure, no mass, no tenderness and anal tone normal. Guaiac negative stool.  Skin: Skin is warm, dry and intact. No rash noted.  Psychiatric: He has a normal mood and affect. His speech is normal and behavior is normal. Thought content normal.    Anoscopy: no complications, non-tender.. Noted non oozing internal hemorrhoids grade 1      Assessment & Plan:

## 2015-05-25 NOTE — Patient Instructions (Signed)
Start topical treatment for internal hemorrhoids.  Start mirilax daily for constipation to stool soft. Increase water and fiber in diet. Increase exercsie. Review info given. Restart aspirin and plavix in 3 days if no further bleeding. Will schedule colonscopy for screen ing routine at upcoming physical.

## 2015-05-26 LAB — HEPATITIS C ANTIBODY: HCV AB: NEGATIVE

## 2015-06-01 ENCOUNTER — Ambulatory Visit (INDEPENDENT_AMBULATORY_CARE_PROVIDER_SITE_OTHER): Payer: Medicare HMO | Admitting: Family Medicine

## 2015-06-01 ENCOUNTER — Encounter: Payer: Self-pay | Admitting: Family Medicine

## 2015-06-01 VITALS — BP 100/70 | HR 74 | Temp 98.1°F | Ht 66.0 in | Wt 185.2 lb

## 2015-06-01 DIAGNOSIS — R7303 Prediabetes: Secondary | ICD-10-CM

## 2015-06-01 DIAGNOSIS — E785 Hyperlipidemia, unspecified: Secondary | ICD-10-CM | POA: Diagnosis not present

## 2015-06-01 DIAGNOSIS — Z0000 Encounter for general adult medical examination without abnormal findings: Secondary | ICD-10-CM

## 2015-06-01 DIAGNOSIS — E875 Hyperkalemia: Secondary | ICD-10-CM | POA: Insufficient documentation

## 2015-06-01 DIAGNOSIS — F331 Major depressive disorder, recurrent, moderate: Secondary | ICD-10-CM

## 2015-06-01 DIAGNOSIS — Z23 Encounter for immunization: Secondary | ICD-10-CM

## 2015-06-01 DIAGNOSIS — K625 Hemorrhage of anus and rectum: Secondary | ICD-10-CM | POA: Diagnosis not present

## 2015-06-01 DIAGNOSIS — I10 Essential (primary) hypertension: Secondary | ICD-10-CM

## 2015-06-01 NOTE — Assessment & Plan Note (Signed)
Eval with A1C. Encouraged exercise, weight loss, healthy eating habits. Low carb diet reviewed.

## 2015-06-01 NOTE — Addendum Note (Signed)
Addended by: Carter Kitten on: 06/01/2015 09:37 AM   Modules accepted: Orders, SmartSet

## 2015-06-01 NOTE — Assessment & Plan Note (Signed)
Likely due to lisinopril and high potassium in diet and multivitamin. Hold in diet and vitamin. Recheck in 1 week.

## 2015-06-01 NOTE — Assessment & Plan Note (Signed)
Well controlled. Continue current medication.  

## 2015-06-01 NOTE — Assessment & Plan Note (Addendum)
Resolved with treatment of constipation and int hemorrhoids.

## 2015-06-01 NOTE — Patient Instructions (Addendum)
Call insurance about colonoscopy coverage.. Let us know if you need a referral. Hold OJ and V8 juice, multivitamin. Decrease alcohol and sugar in diet, work on low carb diet.  Return in 1 weeks for recheck of potassium and Hg A1C.  Increase exercise.

## 2015-06-01 NOTE — Assessment & Plan Note (Signed)
Stable control on venlafaxine

## 2015-06-01 NOTE — Progress Notes (Signed)
Subjective:    Patient ID: Roy [name], male    DOB: Mar 06, 1945, 70 y.o.   MRN: [id number]  HPI I have personally reviewed the Medicare Annual Wellness questionnaire and have noted 1. The patient's medical and social history 2. Their use of alcohol, tobacco or illicit drugs 3. Their current medications and supplements 4. The patient's functional ability including ADL's, fall risks, home safety risks and hearing or visual             impairment. 5. Diet and physical activities 6. Evidence for depression or mood disorders 7.         Updated provider list Cognitive evaluation was performed and recorded on pt medicare questionnaire form. The patients weight, height, BMI and visual acuity have been recorded in the chart  I have made referrals, counseling and provided education to the patient based review of the above and I have provided the pt with a written personalized care plan for preventive services.    Last week.Marland Kitchen He never started Anusol given cost  for internal hemorrhoids causing brbpr.  Today he reports: no more straining with miralax and no further blood in stool.  He has been back on aspirin and plavix in last 5 days.   No abdominal pain.  He also sees Dr. Johnsie Cancel for :coronary artery disease  s/p prior stent to the RCA in 2001, HTN, HL, OSA. Admitted 04/2014 with chest pain and STE during a stress test. LHC demonstrated a subtotally occluded D2 which was treated with a Synergy DES. There was a residual distal PLB 90% treated medically. He was seen by [name], PA-C in FU on 05/24/14. He was having some residual chest pain and Ranexa was started..  Follow up with Dr. Johnsie Cancel last: 03/2015 no changes noted except statin lowered given SE at 80 mg daily.  Hyperkalemia: likely due to diet and lisinopril  Elevated Cholesterol: Almost at goal with CAD < 70, On lipitor 40 mg daily. On co Q 10. He reports he still has cramps in feet and toes.. Once or twice a month. Lab  Results  Component Value Date   CHOL 187 05/25/2015   HDL 47.10 05/25/2015   LDLCALC 97 03/30/2015   LDLDIRECT 85.0 05/25/2015   TRIG 367.0* 05/25/2015   CHOLHDL 4 05/25/2015  Using medications without problems: Occ leg cramp Muscle aches: none Diet compliance:moderate Exercise: walks , but not regularly. Other complaints:  Hypertension: On coreg and lisinopril BP Readings from Last 3 Encounters:  06/01/15 100/70  05/25/15 122/74  04/02/15 130/92  Using medication without problems or lightheadedness: None Chest pain with exertion: occ angina uses isosorbide and nitroglycerin Edema: None Short of breath:None Average home BPs: Not checking at home. Other issues:  Depression, major recurrent and generalize anxiety disorder: Stable on venlafaxine. When coming off.. Symptoms returned. Sleeping well, not needing Lunesta. Rarely using alprazolam... 2 in last 4 months.  Elevated blood sugar, doe drink juice, occ soda.  He does drink 2-4 ETOH beverages at night.        Social History /Family History/Past Medical History reviewed and updated if needed.    Review of Systems  Constitutional: Negative for fatigue.  HENT: Negative for ear pain.   Eyes: Negative for pain.  Respiratory: Negative for cough.   Cardiovascular: Negative for chest pain.  Gastrointestinal: Negative for abdominal pain.       Objective:   Physical Exam  Constitutional: He appears well-developed and well-nourished.  Non-toxic appearance. He does not appear ill.  No distress.  HENT:  Head: Normocephalic and atraumatic.  Right Ear: Hearing, tympanic membrane, external ear and ear canal normal.  Left Ear: Hearing, tympanic membrane, external ear and ear canal normal.  Nose: Nose normal.  Mouth/Throat: Uvula is midline, oropharynx is clear and moist and mucous membranes are normal.  Eyes: Conjunctivae, EOM and lids are normal. Pupils are equal, round, and reactive to light. Lids are everted and  swept, no foreign bodies found.  Neck: Trachea normal, normal range of motion and phonation normal. Neck supple. Carotid bruit is not present. No thyroid mass and no thyromegaly present.  Cardiovascular: Normal rate, regular rhythm, S1 normal, S2 normal, intact distal pulses and normal pulses.  Exam reveals no gallop.   No murmur heard. Pulmonary/Chest: Breath sounds normal. He has no wheezes. He has no rhonchi. He has no rales.  Abdominal: Soft. Normal appearance and bowel sounds are normal. There is no hepatosplenomegaly. There is no tenderness. There is no rebound, no guarding and no CVA tenderness. No hernia.  Lymphadenopathy:    He has no cervical adenopathy.  Neurological: He is alert. He has normal strength and normal reflexes. No cranial nerve deficit or sensory deficit. Gait normal.  Skin: Skin is warm, dry and intact. No rash noted.  Psychiatric: He has a normal mood and affect. His speech is normal and behavior is normal. Judgment normal.          Assessment & Plan:  The patient's preventative maintenance and recommended screening tests for Roy annual wellness exam were reviewed in full today. Brought up to date unless services declined.  Counselled on the importance of diet, exercise, and its role in overall health and mortality. The patient's FH and SH was reviewed, including their home life, tobacco status, and drug and alcohol status.   Vaccines: Due for Tdap, PCV 13/23 ( wil do today)  and zostavax.. Refused today  Colon: look into    nonsmoker: Hep C neg  PSA stable

## 2015-06-01 NOTE — Progress Notes (Signed)
Pre visit review using our clinic review tool, if applicable. No additional management support is needed unless otherwise documented below in the visit note. 

## 2015-06-01 NOTE — Assessment & Plan Note (Signed)
Almost at goal on lower dose lipitor.. Tolerating well. Encouraged exercise, weight loss, healthy eating habits.

## 2015-06-02 ENCOUNTER — Encounter: Payer: Self-pay | Admitting: Cardiovascular Disease

## 2015-06-04 ENCOUNTER — Other Ambulatory Visit: Payer: Self-pay

## 2015-06-04 MED ORDER — ATORVASTATIN CALCIUM 40 MG PO TABS: 40.0000 mg | ORAL_TABLET | Freq: Every day | ORAL | Status: DC

## 2015-06-04 MED ORDER — CLOPIDOGREL BISULFATE 75 MG PO TABS: 75.0000 mg | ORAL_TABLET | Freq: Every day | ORAL | Status: DC

## 2015-06-08 ENCOUNTER — Other Ambulatory Visit: Payer: Self-pay | Admitting: Cardiovascular Disease

## 2015-06-08 ENCOUNTER — Other Ambulatory Visit (INDEPENDENT_AMBULATORY_CARE_PROVIDER_SITE_OTHER): Payer: Medicare HMO | Admitting: *Deleted

## 2015-06-08 DIAGNOSIS — Z79899 Other long term (current) drug therapy: Secondary | ICD-10-CM

## 2015-06-08 LAB — HEPATIC FUNCTION PANEL
ALT: 27 U/L (ref 9–46)
AST: 22 U/L (ref 10–35)
Albumin: 4 g/dL (ref 3.6–5.1)
Alkaline Phosphatase: 58 U/L (ref 40–115)
BILIRUBIN INDIRECT: 0.5 mg/dL (ref 0.2–1.2)
Bilirubin, Direct: 0.1 mg/dL (ref <=0.2)
TOTAL PROTEIN: 6.3 g/dL (ref 6.1–8.1)
Total Bilirubin: 0.6 mg/dL (ref 0.2–1.2)

## 2015-06-08 LAB — LIPID PANEL
CHOLESTEROL: 149 mg/dL (ref 125–200)
HDL: 54 mg/dL (ref >=40)
LDL CALC: 59 mg/dL (ref <130)
TRIGLYCERIDES: 179 mg/dL — AB (ref <150)
Total CHOL/HDL Ratio: 2.8 Ratio (ref <=5.0)
VLDL: 36 mg/dL — ABNORMAL HIGH (ref <30)

## 2015-06-08 NOTE — Addendum Note (Signed)
Addended by: Eulis Foster on: 06/08/2015 10:55 AM   Modules accepted: Orders

## 2015-06-12 ENCOUNTER — Telehealth: Payer: Self-pay | Admitting: Cardiovascular Disease

## 2015-06-12 ENCOUNTER — Other Ambulatory Visit: Payer: Self-pay | Admitting: Cardiovascular Disease

## 2015-06-12 LAB — POTASSIUM: POTASSIUM: 4.8 mmol/L (ref 3.5–5.3)

## 2015-06-12 NOTE — Telephone Encounter (Signed)
Pt returned call to Pam-pls call [phone]

## 2015-06-12 NOTE — Telephone Encounter (Signed)
Patient had lipid and liver panel drawn at our office at [facility]. Patient was suppose to have a potassium and HgbA1c for Dr. Diona Browner. Called lab to see if potassium and HgbA1c could be added. [facility] lab stated they could add these two to order. Will send message to Dr. Diona Browner, so she is aware.

## 2015-06-14 ENCOUNTER — Other Ambulatory Visit: Payer: Self-pay | Admitting: Family Medicine

## 2015-06-14 NOTE — Telephone Encounter (Signed)
Called patient to let him know that his Potassium lab came back, but the HgbA1c is still pending. Since his HgbA1c is still pending, informed patient to go have this lab done at  [facility] lab that Dr. Diona Browner ordered. Informed patient that he would need to be fasting for this lab. Patient stated that he would have it done tomorrow. Will forward message to Dr. Diona Browner, so she is aware.

## 2015-06-14 NOTE — Telephone Encounter (Signed)
Follow up     Patient calling stating the nurse called him today  - returning call back

## 2015-06-15 ENCOUNTER — Telehealth: Payer: Self-pay | Admitting: Cardiovascular Disease

## 2015-06-15 ENCOUNTER — Other Ambulatory Visit (INDEPENDENT_AMBULATORY_CARE_PROVIDER_SITE_OTHER): Payer: Medicare HMO

## 2015-06-15 DIAGNOSIS — R7303 Prediabetes: Secondary | ICD-10-CM

## 2015-06-15 DIAGNOSIS — E875 Hyperkalemia: Secondary | ICD-10-CM | POA: Diagnosis not present

## 2015-06-15 LAB — HEMOGLOBIN A1C: Hgb A1c MFr Bld: 6 % (ref 4.6–6.5)

## 2015-06-15 LAB — POTASSIUM: Potassium: 4.4 mEq/L (ref 3.5–5.1)

## 2015-06-15 NOTE — Telephone Encounter (Signed)
Patient is seeing his PCP for this lab, we were just adding this lab as a curtesy for the patient. Patient will follow-up with his PCP for lab work. [facility] lab aware

## 2015-06-15 NOTE — Telephone Encounter (Signed)
New message     The lab did not have a lavender top for the A1C

## 2015-06-18 LAB — HEMOGLOBIN A1C

## 2015-06-19 ENCOUNTER — Encounter: Payer: Self-pay | Admitting: Family Medicine

## 2015-06-19 ENCOUNTER — Encounter: Payer: Self-pay | Admitting: *Deleted

## 2015-08-03 ENCOUNTER — Other Ambulatory Visit: Payer: Self-pay | Admitting: Cardiovascular Disease

## 2015-08-31 ENCOUNTER — Other Ambulatory Visit: Payer: Self-pay | Admitting: Physician Assistant

## 2015-08-31 NOTE — Telephone Encounter (Signed)
Will forward to Dr. Johnsie Cancel for clarification.

## 2015-08-31 NOTE — Telephone Encounter (Signed)
Should the patient still be taking this medication? Per last office visit with Dr Johnsie Cancel  Discontinued Medications    Reason for Discontinue  isosorbide mononitrate (IMDUR) 60 MG 24 hr tablet Change in therapy   Discontinued Medications   ISOSORBIDE MONONITRATE (IMDUR) 60 MG 24 HR TABLET    Take 1.5 tablets (90 mg total) by mouth at bedtime.   But in assessment and plan Dr Johnsie Cancel has it listed as a continued medication.  Please advise. Thanks, MI

## 2015-09-01 ENCOUNTER — Other Ambulatory Visit: Payer: Self-pay | Admitting: Cardiovascular Disease

## 2015-10-03 ENCOUNTER — Other Ambulatory Visit: Payer: Medicare HMO | Admitting: *Deleted

## 2015-10-03 DIAGNOSIS — E785 Hyperlipidemia, unspecified: Secondary | ICD-10-CM

## 2015-10-03 DIAGNOSIS — I10 Essential (primary) hypertension: Secondary | ICD-10-CM

## 2015-10-03 LAB — HEPATIC FUNCTION PANEL
ALT: 32 U/L (ref 9–46)
AST: 20 U/L (ref 10–35)
Albumin: 3.8 g/dL (ref 3.6–5.1)
Alkaline Phosphatase: 64 U/L (ref 40–115)
Bilirubin, Direct: 0.1 mg/dL (ref <=0.2)
Indirect Bilirubin: 0.5 mg/dL (ref 0.2–1.2)
Total Bilirubin: 0.6 mg/dL (ref 0.2–1.2)
Total Protein: 6.5 g/dL (ref 6.1–8.1)

## 2015-10-03 LAB — LIPID PANEL
Cholesterol: 172 mg/dL (ref 125–200)
HDL: 51 mg/dL (ref >=40)
LDL Cholesterol: 57 mg/dL (ref <130)
Total CHOL/HDL Ratio: 3.4 Ratio (ref <=5.0)
Triglycerides: 322 mg/dL — ABNORMAL HIGH (ref <150)
VLDL: 64 mg/dL — ABNORMAL HIGH (ref <30)

## 2015-10-03 NOTE — Addendum Note (Signed)
Addended by: Eulis Foster on: 10/03/2015 08:01 AM   Modules accepted: Orders

## 2015-10-03 NOTE — Progress Notes (Signed)
Lipid

## 2015-10-15 ENCOUNTER — Other Ambulatory Visit: Payer: Self-pay | Admitting: Cardiovascular Disease

## 2015-10-16 DIAGNOSIS — Z23 Encounter for immunization: Secondary | ICD-10-CM | POA: Diagnosis not present

## 2015-11-21 DIAGNOSIS — H6123 Impacted cerumen, bilateral: Secondary | ICD-10-CM | POA: Diagnosis not present

## 2015-11-21 DIAGNOSIS — H903 Sensorineural hearing loss, bilateral: Secondary | ICD-10-CM | POA: Diagnosis not present

## 2015-11-21 DIAGNOSIS — H6061 Unspecified chronic otitis externa, right ear: Secondary | ICD-10-CM | POA: Diagnosis not present

## 2015-11-25 ENCOUNTER — Other Ambulatory Visit: Payer: Self-pay | Admitting: Cardiovascular Disease

## 2015-11-26 ENCOUNTER — Encounter: Payer: Self-pay | Admitting: *Deleted

## 2015-11-26 ENCOUNTER — Other Ambulatory Visit: Payer: Self-pay | Admitting: Cardiovascular Disease

## 2015-12-05 ENCOUNTER — Other Ambulatory Visit: Payer: Self-pay | Admitting: Physician Assistant

## 2015-12-05 DIAGNOSIS — I25119 Atherosclerotic heart disease of native coronary artery with unspecified angina pectoris: Secondary | ICD-10-CM

## 2015-12-08 ENCOUNTER — Encounter: Payer: Self-pay | Admitting: Cardiovascular Disease

## 2015-12-31 DIAGNOSIS — R69 Illness, unspecified: Secondary | ICD-10-CM | POA: Diagnosis not present

## 2016-02-27 ENCOUNTER — Other Ambulatory Visit: Payer: Self-pay | Admitting: Physician Assistant

## 2016-02-27 DIAGNOSIS — I25119 Atherosclerotic heart disease of native coronary artery with unspecified angina pectoris: Secondary | ICD-10-CM

## 2016-02-28 ENCOUNTER — Other Ambulatory Visit: Payer: Self-pay | Admitting: Cardiovascular Disease

## 2016-02-28 DIAGNOSIS — I25119 Atherosclerotic heart disease of native coronary artery with unspecified angina pectoris: Secondary | ICD-10-CM

## 2016-02-28 MED ORDER — ISOSORBIDE MONONITRATE ER 60 MG PO TB24: 60.0000 mg | ORAL_TABLET | Freq: Every day | ORAL | 0 refills | Status: DC

## 2016-02-28 NOTE — Telephone Encounter (Signed)
Dr. Martinique pt. Please advise

## 2016-03-13 ENCOUNTER — Other Ambulatory Visit: Payer: Self-pay | Admitting: Family Medicine

## 2016-03-18 ENCOUNTER — Other Ambulatory Visit: Payer: Self-pay | Admitting: Cardiovascular Disease

## 2016-03-18 ENCOUNTER — Telehealth: Payer: Self-pay

## 2016-03-18 DIAGNOSIS — I25119 Atherosclerotic heart disease of native coronary artery with unspecified angina pectoris: Secondary | ICD-10-CM

## 2016-03-18 MED ORDER — ISOSORBIDE MONONITRATE ER 60 MG PO TB24: 60.0000 mg | ORAL_TABLET | Freq: Every day | ORAL | 0 refills | Status: DC

## 2016-03-18 NOTE — Telephone Encounter (Signed)
New message     Please call patient back he is returning your call thank you

## 2016-03-18 NOTE — Telephone Encounter (Signed)
Left message for patient to call back. Patient is due for a follow-up appointment with Dr. Johnsie Cancel. Patient also needing a refill for IMDUR.

## 2016-03-18 NOTE — Telephone Encounter (Signed)
Made follow-up appointment for patient to see Dr. Johnsie Cancel and our office will send in refills at his appointment.

## 2016-03-19 ENCOUNTER — Ambulatory Visit (INDEPENDENT_AMBULATORY_CARE_PROVIDER_SITE_OTHER): Payer: Medicare HMO | Admitting: Cardiovascular Disease

## 2016-03-19 ENCOUNTER — Encounter: Payer: Self-pay | Admitting: Cardiovascular Disease

## 2016-03-19 VITALS — BP 120/80 | HR 81 | Ht 66.0 in | Wt 188.1 lb

## 2016-03-19 DIAGNOSIS — I25119 Atherosclerotic heart disease of native coronary artery with unspecified angina pectoris: Secondary | ICD-10-CM

## 2016-03-19 DIAGNOSIS — Z79899 Other long term (current) drug therapy: Secondary | ICD-10-CM | POA: Diagnosis not present

## 2016-03-19 DIAGNOSIS — R079 Chest pain, unspecified: Secondary | ICD-10-CM | POA: Diagnosis not present

## 2016-03-19 MED ORDER — ISOSORBIDE MONONITRATE ER 60 MG PO TB24: 60.0000 mg | ORAL_TABLET | Freq: Every day | ORAL | 3 refills | Status: DC

## 2016-03-19 MED ORDER — CLOPIDOGREL BISULFATE 75 MG PO TABS: 75.0000 mg | ORAL_TABLET | Freq: Every day | ORAL | 3 refills | Status: DC

## 2016-03-19 MED ORDER — CARVEDILOL 12.5 MG PO TABS: ORAL_TABLET | ORAL | 3 refills | Status: DC

## 2016-03-19 MED ORDER — ATORVASTATIN CALCIUM 40 MG PO TABS: 40.0000 mg | ORAL_TABLET | Freq: Every day | ORAL | 3 refills | Status: DC

## 2016-03-19 NOTE — Patient Instructions (Addendum)

## 2016-03-19 NOTE — Progress Notes (Signed)
Patient ID: Roy [name], male   DOB: [birth year], 71 y.o.   MRN: [id number]    Cardiology Office Note   Date:  03/19/2016   ID:  Roy, [name] Sep 25, 1945, MRN [id number]  PCP:  Roy Lofts, MD  Cardiologist:  Dr. Jenkins Rouge     Chief Complaint  Patient presents with  . Coronary Artery Disease     History of Present Illness: Roy [name] is a 71 y.o. male with a hx of CAD s/p prior stent to the RCA in 2001, HTN, HL, OSA.  Admitted 4/16 with chest pain and STE during a stress test.  LHC demonstrated a subtotally occluded D2 which was treated with a Synergy DES.  There was a residual distal PLB 90% treated medically.  He was seen by [name], PA-C in FU on 05/24/14.  He was having some residual chest pain and Ranexa was started. [name] PA saw him in FU  He continued to have occasional episodes of angina. This was described as anterior tightness with radiation to bilateral arms. He could take NTG with relief.  He had not noticed much of a benefit from Ranexa.  It was also too expensive.  I DC'd Ranexa and increased his nitrates.  He returns for FU.    Some cramps ? From statin  Started CoQ lipitor dose lowered LDL under 70   Since last seen, he has been doing well. He denies any further chest discomfort. He walks his dog at least 20 minutes, 3 times a day. He denies chest symptoms with this. He denies significant dyspnea. He denies orthopnea, PND or edema. He denies syncope.  Tachycardic in office 03/3015  Compliant with coreg   Has had two episodes of SSCP in am on waking that he took nitro for and pain subsided In 3-5 minutes. Compliant with meds More sedentary   Studies/Reports Reviewed Today:  LHC/PCI 04/24/14 1. Left main; 20% distal   2. LAD: Very small D1 50-70% ostial, D2 subtotally occluded 3. LCx:  50-70% ostial stenosis.   4. RCA: Patent stent in the distal portion; 90% distal PLB 5. LVEF 35-40%  Severe anterolateral/anteroapical hypokinesia. PCI:   Synergy DES 2.25 x 20 mm to D2  Echo 04/25/14 - Mild LVH. EF 55% to 60%.  Wall motion was normal; Grade 1 diastolic dysfunction). - Mitral valve: Calcified annulus. Impressions:   Normal LV function; grade 1 diastolic dysfunction; trace MR and TR.    Past Medical History:  Diagnosis Date  . Basal cell carcinoma    Bilateral Legs  . CAD (coronary artery disease)    a.  stent to the RCA in 2001;  b.  Negative Myoview in January 2012;  c.  LHC 4/16:  dLM 20, small D1 50-70, D2 sub-total, oLCx 50-70, RCA stent ok, dPLB 90, EF 35-40% >> DES to D2  . Depression   . History of echocardiogram    a.  Echo 4/16: Mild LVH, EF 98-33%, grade 1 diastolic dysfunction, normal wall motion, MAC  . HLD (hyperlipidemia)   . HTN (hypertension)   . OSA (obstructive sleep apnea)   . Vertigo     Past Surgical History:  Procedure Laterality Date  . CATARACT EXTRACTION, BILATERAL    . CORONARY STENT PLACEMENT  2001    RCA  . LEFT HEART CATHETERIZATION WITH CORONARY ANGIOGRAM N/A 02/13/2012   Procedure: LEFT HEART CATHETERIZATION WITH CORONARY ANGIOGRAM;  Surgeon: [name], MD;  Location: [facility] C[facility] LAB;  Service:  Cardiovascular;  Laterality: N/A;  . LEFT HEART CATHETERIZATION WITH CORONARY ANGIOGRAM N/A 04/24/2014   Procedure: LEFT HEART CATHETERIZATION WITH CORONARY ANGIOGRAM;  Surgeon: [name], MD;  Location: [facility] CATH LAB;  Service: Cardiovascular;  Laterality: N/A;  . TONSILLECTOMY       Current Outpatient Prescriptions  Medication Sig Dispense Refill  . ALPRAZolam (XANAX) 0.25 MG tablet Take 0.25 mg by mouth at bedtime as needed for sleep.    Marland Kitchen atorvastatin (LIPITOR) 40 MG tablet Take 1 tablet (40 mg total) by mouth at bedtime. 90 tablet 3  . carvedilol (COREG) 12.5 MG tablet TAKE 1 TABLET (12.5 MG TOTAL) BY MOUTH 2 (TWO) TIMES DAILY. 180 tablet 3  . clopidogrel (PLAVIX) 75 MG tablet Take 1 tablet (75 mg total) by mouth daily. 90 tablet 3  . Coenzyme Q10 (CO Q 10 PO) Take 1 tablet by  mouth daily.    . eszopiclone (LUNESTA) 2 MG TABS Take 2 mg by mouth daily as needed for sleep. Take immediately before bedtime    . hydrocortisone (ANUSOL-HC) 25 MG suppository Place 1 suppository (25 mg total) rectally daily. 6 suppository 0  . isosorbide mononitrate (IMDUR) 60 MG 24 hr tablet Take 1 tablet (60 mg total) by mouth daily. 30 tablet 0  . lisinopril (PRINIVIL,ZESTRIL) 5 MG tablet TAKE 1 TABLET BY MOUTH EVERY DAY 90 tablet 0  . Magnesium Oxide 200 MG TABS Take 200 mg by mouth daily.    . Multiple Vitamins-Minerals (CENTRUM SILVER PO) Take 1 tablet by mouth daily.     . nitroGLYCERIN (NITROSTAT) 0.4 MG SL tablet PLACE 1 TABLET UNDER THE TONGUE EVERY 5 MINUTES AS NEEDED FOR CHEST PAIN, MAX OF 3 DOSES 75 tablet 1  . omeprazole (PRILOSEC) 20 MG capsule TAKE 1 CAPSULE BY MOUTH EVERY DAY 90 capsule 0  . venlafaxine XR (EFFEXOR-XR) 75 MG 24 hr capsule TAKE 1 CAPSULE (75 MG TOTAL) BY MOUTH DAILY WITH BREAKFAST. NEEDS OFFICE VISIT 90 capsule 0   No current facility-administered medications for this visit.     Allergies:   Patient has no known allergies.    Social History:  The patient  reports that he quit smoking about 16 years ago. His smoking use included Cigarettes. He has a 25.00 pack-year smoking history. He has never used smokeless tobacco. He reports that he drinks about 3.0 oz of alcohol per week . He reports that he does not use drugs.   Family History:  The patient's family history includes Alcohol abuse in his mother; Cirrhosis in his mother; Depression in his sister; Heart disease in his father; Hypertension in his father; Peripheral vascular disease in his father.    ROS:   Please see the history of present illness.   Review of Systems  All other systems reviewed and are negative.    PHYSICAL EXAM: VS:  BP 120/80   Pulse 81   Ht 5' 6"  (1.676 m)   Wt 188 lb 1.9 oz (85.3 kg)   SpO2 97%   BMI 30.36 kg/m     Wt Readings from Last 3 Encounters:  03/19/16 188 lb 1.9  oz (85.3 kg)  06/01/15 185 lb 4 oz (84 kg)  05/25/15 188 lb 8 oz (85.5 kg)     GEN: Well nourished, well developed, in no acute distress  HEENT: normal  Neck: no JVD,  no masses Cardiac:  Normal S1/S2, RRR; no murmur,  no rubs or gallops, no edema   Respiratory:  clear to auscultation bilaterally, no  wheezing, rhonchi or rales. GI: soft, nontender, nondistended, + BS MS: no deformity or atrophy  Skin: warm and dry  Neuro:  CNs II-XII intact, Strength and sensation are intact Psych: Normal affect   EKG:  07/26/14    NSR, HR 70, no change from last tracing.   04/02/15  ST rate 111 LAFB  Poor R wave progression   03/19/16  SR rate 61 PAC otherwise normal   Recent Labs: 04/02/2015: TSH 1.82 05/25/2015: BUN 16; Creatinine, Ser 1.03; Hemoglobin 14.5; Platelets 211.0; Sodium 142 06/15/2015: Potassium 4.4 10/03/2015: ALT 32    Lipid Panel    Component Value Date/Time   CHOL 172 10/03/2015 0801   TRIG 322 (H) 10/03/2015 0801   TRIG 130 12/08/2005 0741   HDL 51 10/03/2015 0801   CHOLHDL 3.4 10/03/2015 0801   VLDL 64 (H) 10/03/2015 0801   LDLCALC 57 10/03/2015 0801   LDLDIRECT 85.0 05/25/2015 0859      ASSESSMENT AND PLAN:  1.  Coronary artery disease involving native coronary artery of native heart with angina pectoris:   DES to D2 04/2014  Residual disease SSCP requiring nitro will order exercise myovue to further risk stratify . Continue aspirin, Lipitor, Coreg, Plavix, Imdur, lisinopril.   2.  Essential hypertension:  Controlled.  3.  Dyslipidemia:  conitnue statin  Lab Results  Component Value Date   LDLCALC 57 10/03/2015    4. Tachycardia:  Labs including Hct and TSH were ok better now observe    Medication Adjustments: Current medicines are reviewed at length with the patient today.  Concerns regarding medicines are as outlined above.  The following changes have been made:   Discontinued Medications   NITROGLYCERIN (NITROSTAT) 0.4 MG SL TABLET    PLACE 1 TABLET UNDER THE  TONGUE EVERY 5 MINUTES AS NEEDED FOR CHEST PAIN, MAX OF 3 DOSES   Modified Medications   No medications on file   New Prescriptions   No medications on file    Labs/ tests ordered today include:  Exercise Myovue   Disposition:   FU with me  In a year if myovue ok    [employer]

## 2016-03-20 ENCOUNTER — Telehealth (HOSPITAL_COMMUNITY): Payer: Self-pay | Admitting: *Deleted

## 2016-03-20 NOTE — Telephone Encounter (Signed)
Left message on voicemail in reference to upcoming appointment scheduled for 03/20/16 Phone number given for a call back so details instructions can be given.  Roy [name]

## 2016-03-24 ENCOUNTER — Telehealth (HOSPITAL_COMMUNITY): Payer: Self-pay | Admitting: *Deleted

## 2016-03-24 NOTE — Telephone Encounter (Signed)
Patient given detailed instructions per Myocardial Perfusion Study Information Sheet for the test on 03/25/16 at 0745. Patient notified to arrive 15 minutes early and that it is imperative to arrive on time for appointment to keep from having the test rescheduled.  If you need to cancel or reschedule your appointment, please call the office within 24 hours of your appointment. Failure to do so may result in a cancellation of your appointment, and a $50 no show fee. Patient verbalized understanding.[place], Ranae Palms

## 2016-03-25 ENCOUNTER — Encounter (HOSPITAL_COMMUNITY): Payer: Medicare HMO

## 2016-03-25 ENCOUNTER — Other Ambulatory Visit: Payer: Medicare HMO

## 2016-03-26 ENCOUNTER — Ambulatory Visit (HOSPITAL_COMMUNITY): Payer: Medicare HMO | Attending: Cardiovascular Disease

## 2016-03-26 ENCOUNTER — Other Ambulatory Visit: Payer: Medicare HMO | Admitting: *Deleted

## 2016-03-26 DIAGNOSIS — I10 Essential (primary) hypertension: Secondary | ICD-10-CM | POA: Insufficient documentation

## 2016-03-26 DIAGNOSIS — R931 Abnormal findings on diagnostic imaging of heart and coronary circulation: Secondary | ICD-10-CM | POA: Insufficient documentation

## 2016-03-26 DIAGNOSIS — E785 Hyperlipidemia, unspecified: Secondary | ICD-10-CM | POA: Insufficient documentation

## 2016-03-26 DIAGNOSIS — I25119 Atherosclerotic heart disease of native coronary artery with unspecified angina pectoris: Secondary | ICD-10-CM | POA: Diagnosis not present

## 2016-03-26 DIAGNOSIS — Z79899 Other long term (current) drug therapy: Secondary | ICD-10-CM

## 2016-03-26 DIAGNOSIS — R079 Chest pain, unspecified: Secondary | ICD-10-CM

## 2016-03-26 LAB — LIPID PANEL
CHOL/HDL RATIO: 4.4 ratio (ref 0.0–5.0)
Cholesterol, Total: 174 mg/dL (ref 100–199)
HDL: 40 mg/dL (ref >39)
LDL Calculated: 65 mg/dL (ref 0–99)
Triglycerides: 347 mg/dL — ABNORMAL HIGH (ref 0–149)
VLDL Cholesterol Cal: 69 mg/dL — ABNORMAL HIGH (ref 5–40)

## 2016-03-26 LAB — HEPATIC FUNCTION PANEL
ALBUMIN: 4 g/dL (ref 3.5–4.8)
ALK PHOS: 72 IU/L (ref 39–117)
ALT: 52 IU/L — ABNORMAL HIGH (ref 0–44)
AST: 38 IU/L (ref 0–40)
BILIRUBIN TOTAL: 0.3 mg/dL (ref 0.0–1.2)
BILIRUBIN, DIRECT: 0.08 mg/dL (ref 0.00–0.40)
TOTAL PROTEIN: 6.6 g/dL (ref 6.0–8.5)

## 2016-03-26 LAB — MYOCARDIAL PERFUSION IMAGING
CHL CUP RESTING HR STRESS: 75 {beats}/min
CSEPED: 6 min
CSEPEDS: 0 s
CSEPEW: 7 METS
LHR: 0.28
LV dias vol: 78 mL (ref 62–150)
LV sys vol: 33 mL
MPHR: 150 {beats}/min
Peak HR: 157 {beats}/min
Percent HR: 104 %
SDS: 1
SRS: 11
SSS: 12
TID: 0.95

## 2016-03-26 MED ORDER — TECHNETIUM TC 99M TETROFOSMIN IV KIT
33.0000 | PACK | Freq: Once | INTRAVENOUS | Status: AC | PRN
  Administered 2016-03-26: 33 via INTRAVENOUS
  Filled 2016-03-26: qty 33

## 2016-03-26 MED ORDER — TECHNETIUM TC 99M TETROFOSMIN IV KIT
9.8000 | PACK | Freq: Once | INTRAVENOUS | Status: AC | PRN
  Administered 2016-03-26: 9.8 via INTRAVENOUS
  Filled 2016-03-26: qty 10

## 2016-03-31 DIAGNOSIS — I209 Angina pectoris, unspecified: Secondary | ICD-10-CM | POA: Diagnosis not present

## 2016-03-31 DIAGNOSIS — H9113 Presbycusis, bilateral: Secondary | ICD-10-CM | POA: Diagnosis not present

## 2016-03-31 DIAGNOSIS — Z87891 Personal history of nicotine dependence: Secondary | ICD-10-CM | POA: Diagnosis not present

## 2016-03-31 DIAGNOSIS — Z955 Presence of coronary angioplasty implant and graft: Secondary | ICD-10-CM | POA: Diagnosis not present

## 2016-03-31 DIAGNOSIS — E785 Hyperlipidemia, unspecified: Secondary | ICD-10-CM | POA: Diagnosis not present

## 2016-03-31 DIAGNOSIS — Z8679 Personal history of other diseases of the circulatory system: Secondary | ICD-10-CM | POA: Diagnosis not present

## 2016-03-31 DIAGNOSIS — Z7982 Long term (current) use of aspirin: Secondary | ICD-10-CM | POA: Diagnosis not present

## 2016-03-31 DIAGNOSIS — Z9849 Cataract extraction status, unspecified eye: Secondary | ICD-10-CM | POA: Diagnosis not present

## 2016-03-31 DIAGNOSIS — Z0000 Encounter for general adult medical examination without abnormal findings: Secondary | ICD-10-CM | POA: Diagnosis not present

## 2016-03-31 DIAGNOSIS — Z79899 Other long term (current) drug therapy: Secondary | ICD-10-CM | POA: Diagnosis not present

## 2016-03-31 DIAGNOSIS — K219 Gastro-esophageal reflux disease without esophagitis: Secondary | ICD-10-CM | POA: Diagnosis not present

## 2016-03-31 DIAGNOSIS — Z974 Presence of external hearing-aid: Secondary | ICD-10-CM | POA: Diagnosis not present

## 2016-03-31 DIAGNOSIS — E669 Obesity, unspecified: Secondary | ICD-10-CM | POA: Diagnosis not present

## 2016-03-31 DIAGNOSIS — R233 Spontaneous ecchymoses: Secondary | ICD-10-CM | POA: Diagnosis not present

## 2016-03-31 DIAGNOSIS — R69 Illness, unspecified: Secondary | ICD-10-CM | POA: Diagnosis not present

## 2016-03-31 DIAGNOSIS — I10 Essential (primary) hypertension: Secondary | ICD-10-CM | POA: Diagnosis not present

## 2016-04-03 ENCOUNTER — Other Ambulatory Visit: Payer: Self-pay | Admitting: Cardiovascular Disease

## 2016-04-19 ENCOUNTER — Other Ambulatory Visit: Payer: Self-pay | Admitting: Cardiovascular Disease

## 2016-04-19 DIAGNOSIS — I25119 Atherosclerotic heart disease of native coronary artery with unspecified angina pectoris: Secondary | ICD-10-CM

## 2016-04-21 NOTE — Telephone Encounter (Signed)
Medication Detail    Disp Refills Start End   isosorbide mononitrate (IMDUR) 60 MG 24 hr tablet 90 tablet 3 03/19/2016    Sig - Route: Take 1 tablet (60 mg total) by mouth daily. - Oral   E-Prescribing Status: Receipt confirmed by pharmacy (03/19/2016 10:40 AM EST)   Associated Diagnoses   Coronary artery disease involving native coronary artery of native heart with angina pectoris [facility]) - Primary     Pharmacy   [id number] - [place], [place] - [address]

## 2016-05-26 ENCOUNTER — Telehealth: Payer: Self-pay

## 2016-05-26 NOTE — Telephone Encounter (Signed)
Patient is on the list for [employer] 2018 and may be a good candidate for an AWV. Please let me know if/when appt is scheduled.   

## 2016-06-08 ENCOUNTER — Other Ambulatory Visit: Payer: Self-pay | Admitting: Family Medicine

## 2016-06-11 NOTE — Telephone Encounter (Signed)
Left pt message asking to call Allison back directly at [phone] to schedule AWV.+ labs with Lesia and CPE with PCP. °

## 2016-06-11 NOTE — Telephone Encounter (Signed)
Scheduled 07/08/16 °

## 2016-06-24 ENCOUNTER — Other Ambulatory Visit: Payer: Self-pay | Admitting: Cardiovascular Disease

## 2016-06-30 DIAGNOSIS — R69 Illness, unspecified: Secondary | ICD-10-CM | POA: Diagnosis not present

## 2016-07-08 ENCOUNTER — Ambulatory Visit (INDEPENDENT_AMBULATORY_CARE_PROVIDER_SITE_OTHER): Payer: Medicare HMO

## 2016-07-08 ENCOUNTER — Telehealth: Payer: Self-pay | Admitting: Family Medicine

## 2016-07-08 VITALS — BP 102/80 | HR 82 | Temp 98.0°F | Ht 65.5 in | Wt 186.5 lb

## 2016-07-08 DIAGNOSIS — I10 Essential (primary) hypertension: Secondary | ICD-10-CM

## 2016-07-08 DIAGNOSIS — Z0000 Encounter for general adult medical examination without abnormal findings: Secondary | ICD-10-CM

## 2016-07-08 DIAGNOSIS — Z125 Encounter for screening for malignant neoplasm of prostate: Secondary | ICD-10-CM | POA: Diagnosis not present

## 2016-07-08 DIAGNOSIS — Z23 Encounter for immunization: Secondary | ICD-10-CM | POA: Diagnosis not present

## 2016-07-08 DIAGNOSIS — R7303 Prediabetes: Secondary | ICD-10-CM

## 2016-07-08 LAB — CBC WITH DIFFERENTIAL/PLATELET
BASOS ABS: 0 10*3/uL (ref 0.0–0.1)
BASOS PCT: 0.5 % (ref 0.0–3.0)
EOS ABS: 0.2 10*3/uL (ref 0.0–0.7)
Eosinophils Relative: 2.4 % (ref 0.0–5.0)
HCT: 42.5 % (ref 39.0–52.0)
Hemoglobin: 14.7 g/dL (ref 13.0–17.0)
LYMPHS ABS: 1.9 10*3/uL (ref 0.7–4.0)
Lymphocytes Relative: 20.6 % (ref 12.0–46.0)
MCHC: 34.6 g/dL (ref 30.0–36.0)
MCV: 93.6 fl (ref 78.0–100.0)
Monocytes Absolute: 1 10*3/uL (ref 0.1–1.0)
Monocytes Relative: 10.7 % (ref 3.0–12.0)
NEUTROS ABS: 6 10*3/uL (ref 1.4–7.7)
NEUTROS PCT: 65.8 % (ref 43.0–77.0)
PLATELETS: 226 10*3/uL (ref 150.0–400.0)
RBC: 4.54 Mil/uL (ref 4.22–5.81)
RDW: 12.9 % (ref 11.5–15.5)
WBC: 9.1 10*3/uL (ref 4.0–10.5)

## 2016-07-08 LAB — COMPREHENSIVE METABOLIC PANEL
ALK PHOS: 63 U/L (ref 39–117)
ALT: 34 U/L (ref 0–53)
AST: 19 U/L (ref 0–37)
Albumin: 4.1 g/dL (ref 3.5–5.2)
BUN: 12 mg/dL (ref 6–23)
CHLORIDE: 105 meq/L (ref 96–112)
CO2: 30 mEq/L (ref 19–32)
Calcium: 9.8 mg/dL (ref 8.4–10.5)
Creatinine, Ser: 1.1 mg/dL (ref 0.40–1.50)
GFR: 70.26 mL/min (ref >60.00)
GLUCOSE: 128 mg/dL — AB (ref 70–99)
POTASSIUM: 5.4 meq/L — AB (ref 3.5–5.1)
Sodium: 141 mEq/L (ref 135–145)
TOTAL PROTEIN: 6.5 g/dL (ref 6.0–8.3)
Total Bilirubin: 0.6 mg/dL (ref 0.2–1.2)

## 2016-07-08 LAB — PSA, MEDICARE: PSA: 0.61 ng/ml (ref 0.10–4.00)

## 2016-07-08 LAB — TSH: TSH: 1.63 u[IU]/mL (ref 0.35–4.50)

## 2016-07-08 LAB — HEMOGLOBIN A1C: HEMOGLOBIN A1C: 6.7 % — AB (ref 4.6–6.5)

## 2016-07-08 NOTE — Progress Notes (Signed)
Pre visit review using our clinic review tool, if applicable. No additional management support is needed unless otherwise documented below in the visit note. 

## 2016-07-08 NOTE — Telephone Encounter (Signed)
-----   Message from Eustace Pen, LPN sent at 03/27/3886  4:13 PM EDT ----- Regarding: Labs 6/26 [employer]  Please place lab orders.

## 2016-07-08 NOTE — Patient Instructions (Signed)
[name] , Thank you for taking time to come for your Medicare Wellness Visit. I appreciate your ongoing commitment to your health goals. Please review the following plan we discussed and let me know if I can assist you in the future.   These are the goals we discussed: Goals    . Increase water intake          Starting 07/08/2016, I will attempt to drink at least 6-8 glasses of water daily.        This is a list of the screening recommended for you and due dates:  Health Maintenance  Topic Date Due  . Colon Cancer Screening  07/09/2026*  . Tetanus Vaccine  07/09/2026*  . Flu Shot  08/13/2016  .  Hepatitis C: One time screening is recommended by Center for Disease Control  (CDC) for  adults born from [birth year] through 1964.   Completed  . Pneumonia vaccines  Completed  *Topic was postponed. The date shown is not the original due date.   Preventive Care for Adults  A healthy lifestyle and preventive care can promote health and wellness. Preventive health guidelines for adults include the following key practices.  . A routine yearly physical is a good way to check with your health care provider about your health and preventive screening. It is a chance to share any concerns and updates on your health and to receive a thorough exam.  . Visit your dentist for a routine exam and preventive care every 6 months. Brush your teeth twice a day and floss once a day. Good oral hygiene prevents tooth decay and gum disease.  . The frequency of eye exams is based on your age, health, family medical history, use  of contact lenses, and other factors. Follow your health care provider's ecommendations for frequency of eye exams.  . Eat a healthy diet. Foods like vegetables, fruits, whole grains, low-fat dairy products, and lean protein foods contain the nutrients you need without too many calories. Decrease your intake of foods high in solid fats, added sugars, and salt. Eat the right amount of  calories for you. Get information about a proper diet from your health care provider, if necessary.  . Regular physical exercise is one of the most important things you can do for your health. Most adults should get at least 150 minutes of moderate-intensity exercise (any activity that increases your heart rate and causes you to sweat) each week. In addition, most adults need muscle-strengthening exercises on 2 or more days a week.  Silver Sneakers may be a benefit available to you. To determine eligibility, you may visit the website: www.silversneakers.com or contact program at (718) 431-0678 Mon-Fri between 8AM-8PM.   . Maintain a healthy weight. The body mass index (BMI) is a screening tool to identify possible weight problems. It provides an estimate of body fat based on height and weight. Your health care provider can find your BMI and can help you achieve or maintain a healthy weight.   For adults 20 years and older: ? A BMI below 18.5 is considered underweight. ? A BMI of 18.5 to 24.9 is normal. ? A BMI of 25 to 29.9 is considered overweight. ? A BMI of 30 and above is considered obese.   . Maintain normal blood lipids and cholesterol levels by exercising and minimizing your intake of saturated fat. Eat a balanced diet with plenty of fruit and vegetables. Blood tests for lipids and cholesterol should begin at age 53 and be  repeated every 5 years. If your lipid or cholesterol levels are high, you are over 50, or you are at high risk for heart disease, you may need your cholesterol levels checked more frequently. Ongoing high lipid and cholesterol levels should be treated with medicines if diet and exercise are not working.  . If you smoke, find out from your health care provider how to quit. If you do not use tobacco, please do not start.  . If you choose to drink alcohol, please do not consume more than 2 drinks per day. One drink is considered to be 12 ounces (355 mL) of beer, 5 ounces  (148 mL) of wine, or 1.5 ounces (44 mL) of liquor.  . If you are 57-66 years old, ask your health care provider if you should take aspirin to prevent strokes.  . Use sunscreen. Apply sunscreen liberally and repeatedly throughout the day. You should seek shade when your shadow is shorter than you. Protect yourself by wearing long sleeves, pants, a wide-brimmed hat, and sunglasses year round, whenever you are outdoors.  . Once a month, do a whole body skin exam, using a mirror to look at the skin on your back. Tell your health care provider of new moles, moles that have irregular borders, moles that are larger than a pencil eraser, or moles that have changed in shape or color.

## 2016-07-08 NOTE — Progress Notes (Signed)
Subjective:   Roy [name] is a 71 y.o. male who presents for Medicare Annual/Subsequent preventive examination.  Review of Systems:  N/A Cardiac Risk Factors include: advanced age (>2mn, >>71women);male gender;obesity (BMI >30kg/m2);dyslipidemia;hypertension     Objective:    Vitals: BP 102/80 (BP Location: Right Arm, Patient Position: Sitting, Cuff Size: Normal)   Pulse 82   Temp 98 F (36.7 C) (Oral)   Ht 5' 5.5" (1.664 m) Comment: no shoes  Wt 186 lb 8 oz (84.6 kg)   SpO2 94%   BMI 30.56 kg/m   Body mass index is 30.56 kg/m.  Tobacco History  Smoking Status  . Former Smoker  . Packs/day: 1.00  . Years: 25.00  . Types: Cigarettes  . Quit date: 01/14/2000  Smokeless Tobacco  . Never Used     Counseling given: No   Past Medical History:  Diagnosis Date  . Basal cell carcinoma    Bilateral Legs  . CAD (coronary artery disease)    a.  stent to the RCA in 2001;  b.  Negative Myoview in January 2012;  c.  LHC 4/16:  dLM 20, small D1 50-70, D2 sub-total, oLCx 50-70, RCA stent ok, dPLB 90, EF 35-40% >> DES to D2  . Depression   . History of echocardiogram    a.  Echo 4/16: Mild LVH, EF 507-68% grade 1 diastolic dysfunction, normal wall motion, MAC  . HLD (hyperlipidemia)   . HTN (hypertension)   . OSA (obstructive sleep apnea)   . Vertigo    Past Surgical History:  Procedure Laterality Date  . CATARACT EXTRACTION, BILATERAL    . CORONARY STENT PLACEMENT  2001    RCA  . LEFT HEART CATHETERIZATION WITH CORONARY ANGIOGRAM N/A 02/13/2012   Procedure: LEFT HEART CATHETERIZATION WITH CORONARY ANGIOGRAM;  Surgeon: Peter M JMartinique MD;  [facility];  Service: Cardiovascular;  Laterality: N/A;  . LEFT HEART CATHETERIZATION WITH CORONARY ANGIOGRAM N/A 04/24/2014   Procedure: LEFT HEART CATHETERIZATION WITH CORONARY ANGIOGRAM;  Surgeon: JLorretta Harp MD;  [facility];  Service: Cardiovascular;  Laterality: N/A;  . TONSILLECTOMY     Family  History  Problem Relation Age of Onset  . Cirrhosis Mother   . Alcohol abuse Mother   . Heart disease Father   . Hypertension Father   . Peripheral vascular disease Father   . Depression Sister   . Heart attack Neg Hx   . Stroke Neg Hx    History  Sexual Activity  . Sexual activity: Not Currently    Outpatient Encounter Prescriptions as of 07/08/2016  Medication Sig  . ALPRAZolam (XANAX) 0.25 MG tablet Take 0.25 mg by mouth at bedtime as needed for sleep.  .Marland Kitchenatorvastatin (LIPITOR) 40 MG tablet Take 1 tablet (40 mg total) by mouth at bedtime.  . carvedilol (COREG) 12.5 MG tablet TAKE 1 TABLET (12.5 MG TOTAL) BY MOUTH 2 (TWO) TIMES DAILY.  .Marland Kitchenclopidogrel (PLAVIX) 75 MG tablet Take 1 tablet (75 mg total) by mouth daily.  . Coenzyme Q10 (CO Q 10 PO) Take 1 tablet by mouth daily.  . isosorbide mononitrate (IMDUR) 60 MG 24 hr tablet Take 1 tablet (60 mg total) by mouth daily.  .Marland Kitchenlisinopril (PRINIVIL,ZESTRIL) 5 MG tablet TAKE 1 TABLET BY MOUTH EVERY DAY  . Magnesium Oxide 200 MG TABS Take 200 mg by mouth daily.  . Multiple Vitamins-Minerals (CENTRUM SILVER PO) Take 1 tablet by mouth daily.   . nitroGLYCERIN (NITROSTAT) 0.4 MG  SL tablet PLACE 1 TABLET UNDER THE TONGUE EVERY 5 MINUTES AS NEEDED FOR CHEST PAIN, MAX OF 3 DOSES  . omeprazole (PRILOSEC) 20 MG capsule TAKE 1 CAPSULE BY MOUTH EVERY DAY  . venlafaxine XR (EFFEXOR-XR) 75 MG 24 hr capsule TAKE 1 CAPSULE (75 MG TOTAL) BY MOUTH DAILY WITH BREAKFAST. NEEDS OFFICE VISIT  . eszopiclone (LUNESTA) 2 MG TABS Take 2 mg by mouth daily as needed for sleep. Take immediately before bedtime  . hydrocortisone (ANUSOL-HC) 25 MG suppository Place 1 suppository (25 mg total) rectally daily. (Patient not taking: Reported on 07/08/2016)   No facility-administered encounter medications on file as of 07/08/2016.     Activities of Daily Living In your present state of health, do you have any difficulty performing the following activities: 07/08/2016    Hearing? Y  Vision? N  Difficulty concentrating or making decisions? N  Walking or climbing stairs? N  Dressing or bathing? N  Doing errands, shopping? N  Preparing Food and eating ? N  Using the Toilet? N  In the past six months, have you accidently leaked urine? N  Do you have problems with loss of bowel control? N  Managing your Medications? N  Managing your Finances? N  Housekeeping or managing your Housekeeping? N  Some recent data might be hidden    Patient Care Team: Jinny Sanders, MD as PCP - General (Family Medicine)   Assessment:     Visual Acuity Screening   Right eye Left eye Both eyes  Without correction:     With correction: 20/30 20/25 20/20-1  Hearing Screening Comments: Bilateral hearing aids   Exercise Activities and Dietary recommendations Current Exercise Habits: The patient does not participate in regular exercise at present, Exercise limited by: None identified  Goals    . Increase water intake          Starting 07/08/2016, I will attempt to drink at least 6-8 glasses of water daily.       Fall Risk Fall Risk  07/08/2016 06/01/2015  Falls in the past year? No No   Depression Screen PHQ 2/9 Scores 07/08/2016 06/01/2015  PHQ - 2 Score 0 0    Cognitive Function MMSE - Mini Mental State Exam 07/08/2016  Orientation to time 5  Orientation to Place 5  Registration 3  Attention/ Calculation 0  Recall 3  Language- name 2 objects 0  Language- repeat 1  Language- follow 3 step command 3  Language- read & follow direction 0  Write a sentence 0  Copy design 0  Total score 20       PLEASE NOTE: A Mini-Cog screen was completed. Maximum score is 20. A value of 0 denotes this part of Folstein MMSE was not completed or the patient failed this part of the Mini-Cog screening.   Mini-Cog Screening Orientation to Time - Max 5 pts Orientation to Place - Max 5 pts Registration - Max 3 pts Recall - Max 3 pts Language Repeat - Max 1 pts Language Follow  3 Step Command - Max 3 pts   Immunization History  Administered Date(s) Administered  . Influenza-Unspecified 11/13/2012, 09/14/2015  . Pneumococcal Conjugate-13 06/01/2015   Screening Tests Health Maintenance  Topic Date Due  . COLONOSCOPY  07/09/2026 (Originally 02/11/1996)  . TETANUS/TDAP  07/09/2026 (Originally 02/10/1965)  . INFLUENZA VACCINE  08/13/2016  . Hepatitis C Screening  Completed  . PNA vac Low Risk Adult  Completed      Plan:     I  have personally reviewed and addressed the Medicare Annual Wellness questionnaire and have noted the following in the patient's chart:  A. Medical and social history B. Use of alcohol, tobacco or illicit drugs  C. Current medications and supplements D. Functional ability and status E.  Nutritional status F.  Physical activity G. Advance directives H. List of other physicians I.  Hospitalizations, surgeries, and ER visits in previous 12 months J.  [place] to include hearing, vision, cognitive, depression L. Referrals and appointments - none  In addition, I have reviewed and discussed with patient certain preventive protocols, quality metrics, and best practice recommendations. A written personalized care plan for preventive services as well as general preventive health recommendations were provided to patient.  See attached scanned questionnaire for additional information.   Signed,   Lindell Noe, MHA, BS, LPN Health Coach

## 2016-07-08 NOTE — Progress Notes (Signed)
PCP notes:   Health maintenance:  Colon cancer screening - pt declined Tetanus - postponed/insurance PPSV23 - administered  Abnormal screenings:   None  Patient concerns:   Pt states cholesterol medication is causing increased cramping.  Nurse concerns:  None  Next PCP appt:   07/11/16 @ 1515

## 2016-07-08 NOTE — Progress Notes (Signed)
I reviewed health advisor's note, was available for consultation, and agree with documentation and plan.  

## 2016-07-11 ENCOUNTER — Encounter: Payer: Self-pay | Admitting: Family Medicine

## 2016-07-11 ENCOUNTER — Ambulatory Visit (INDEPENDENT_AMBULATORY_CARE_PROVIDER_SITE_OTHER): Payer: Medicare HMO | Admitting: Family Medicine

## 2016-07-11 VITALS — BP 134/80 | HR 75 | Temp 98.4°F | Ht 65.5 in | Wt 186.5 lb

## 2016-07-11 DIAGNOSIS — Z0000 Encounter for general adult medical examination without abnormal findings: Secondary | ICD-10-CM

## 2016-07-11 DIAGNOSIS — E119 Type 2 diabetes mellitus without complications: Secondary | ICD-10-CM | POA: Diagnosis not present

## 2016-07-11 DIAGNOSIS — R351 Nocturia: Secondary | ICD-10-CM | POA: Diagnosis not present

## 2016-07-11 DIAGNOSIS — K625 Hemorrhage of anus and rectum: Secondary | ICD-10-CM

## 2016-07-11 DIAGNOSIS — E785 Hyperlipidemia, unspecified: Secondary | ICD-10-CM

## 2016-07-11 DIAGNOSIS — E875 Hyperkalemia: Secondary | ICD-10-CM | POA: Diagnosis not present

## 2016-07-11 DIAGNOSIS — R252 Cramp and spasm: Secondary | ICD-10-CM | POA: Diagnosis not present

## 2016-07-11 DIAGNOSIS — I10 Essential (primary) hypertension: Secondary | ICD-10-CM

## 2016-07-11 DIAGNOSIS — F331 Major depressive disorder, recurrent, moderate: Secondary | ICD-10-CM

## 2016-07-11 DIAGNOSIS — R69 Illness, unspecified: Secondary | ICD-10-CM | POA: Diagnosis not present

## 2016-07-11 LAB — HM DIABETES FOOT EXAM

## 2016-07-11 NOTE — Assessment & Plan Note (Signed)
Discussed low carb diet. No indication for medication.  Needs yearly eye exam.  on ACEI.  Follow up in 6 months.

## 2016-07-11 NOTE — Assessment & Plan Note (Signed)
Well controlled. Continue current medication. Trigs elevated given increased sugar.

## 2016-07-11 NOTE — Assessment & Plan Note (Signed)
Decrease potassium in diet.Roy [name] refuses recheck.

## 2016-07-11 NOTE — Assessment & Plan Note (Signed)
In part due to sugar and dehydration. Increase water and lower carbs.

## 2016-07-11 NOTE — Assessment & Plan Note (Signed)
Pt continues to refuse colon cancer screening with colonoscopy despite recommendations.

## 2016-07-11 NOTE — Assessment & Plan Note (Signed)
Stable control on venlafaxine, pt wishes to continue. Rarely using alprazolam.

## 2016-07-11 NOTE — Progress Notes (Signed)
Subjective:    Patient ID: Roy [name], male    DOB: May 22, 1945, 71 y.o.   MRN: [id number]  HPI  The patient presents for complete physical and review of chronic health problems. He/She also has the following acute concerns today:  He has noted nocturia at night.. Trouble emptying bladder, has to get back up immediately.  no dysuria, no urinary frequency.  does not bother him much.  He has been having some cramps in legs off and on.  The patient saw Candis Musa, LPN for medicare wellness. Note reviewed in detail and important notes copied below.  Health maintenance: Colon cancer screening - pt declined Tetanus - postponed/insurance PPSV23 - administered Abnormal screenings:  None Patient concerns:  Pt states cholesterol medication is causing increased cramping.  Hypertension:    Good control on coreg and lisinopril BP Readings from Last 3 Encounters:  07/11/16 134/80  07/08/16 102/80  03/19/16 120/80  Using medication without problems or lightheadedness: none  Chest pain with exertion: none Edema:none Short of breath: none Average home BPs: Other issues:   Elevated Cholesterol:  LDL at goal last check on lipitor .Marland Kitchen Triglycerides elevated Lab Results  Component Value Date   CHOL 174 03/26/2016   HDL 40 03/26/2016   LDLCALC 65 03/26/2016   LDLDIRECT 85.0 05/25/2015   TRIG 347 (H) 03/26/2016   CHOLHDL 4.4 03/26/2016  Using medications without problems: Muscle aches:  Diet compliance: moderate, drinking soda. Exercise: Other complaints:   Recent high potassium:  Stopped multivitamin, and V8 juice   Depression, major recurrent: Good control on venlafaxine. Using alprazolam prn.. Using about every 6 months.   prediabetes:  Worsened control Lab Results  Component Value Date   HGBA1C 6.7 (H) 07/08/2016     He also sees Dr. Johnsie Cancel for :coronary artery disease  s/p prior stent to the RCA in 2001, HTN, HL, OSA. Admitted 04/2014 with chest pain  and STE during a stress test. LHC demonstrated a subtotally occluded D2 which was treated with a Synergy DES. There was a residual distal PLB 90% treated medically. He was seen by [name], PA-C in FU on 05/24/14. He was having some residual chest pain and Ranexa was started..  On plavix for anticoag  Social History /Family History/Past Medical History reviewed in detail and updated in EMR if needed. Blood pressure 134/80, pulse 75, temperature 98.4 F (36.9 C), temperature source Oral, height 5' 5.5" (1.664 m), weight 186 lb 8 oz (84.6 kg), SpO2 95 %.  Review of Systems     Objective:   Physical Exam  Constitutional: He appears well-developed and well-nourished.  Non-toxic appearance. He does not appear ill. No distress.  HENT:  Head: Normocephalic and atraumatic.  Right Ear: Hearing, tympanic membrane, external ear and ear canal normal.  Left Ear: Hearing, tympanic membrane, external ear and ear canal normal.  Nose: Nose normal.  Mouth/Throat: Uvula is midline, oropharynx is clear and moist and mucous membranes are normal.  Eyes: Conjunctivae, EOM and lids are normal. Pupils are equal, round, and reactive to light. Lids are everted and swept, no foreign bodies found.  Neck: Trachea normal, normal range of motion and phonation normal. Neck supple. Carotid bruit is not present. No thyroid mass and no thyromegaly present.  Cardiovascular: Normal rate, regular rhythm, S1 normal, S2 normal, intact distal pulses and normal pulses.  Exam reveals no gallop.   No murmur heard. Pulmonary/Chest: Breath sounds normal. He has no wheezes. He has no rhonchi. He has no rales.  Abdominal: Soft. Normal appearance and bowel sounds are normal. There is no hepatosplenomegaly. There is no tenderness. There is no rebound, no guarding and no CVA tenderness. No hernia.  Lymphadenopathy:    He has no cervical adenopathy.  Neurological: He is alert. He has normal strength and normal reflexes. No cranial  nerve deficit or sensory deficit. Gait normal.  Skin: Skin is warm, dry and intact. No rash noted.  Psychiatric: He has a normal mood and affect. His speech is normal and behavior is normal. Judgment normal.      Diabetic foot exam: Normal inspection No skin breakdown No calluses  Normal DP pulses Normal sensation to light touch and monofilament Nails normal     Assessment & Plan:  The patient's preventative maintenance and recommended screening tests for an annual wellness exam were reviewed in full today. Brought up to date unless services declined.  Counselled on the importance of diet, exercise, and its role in overall health and mortality. The patient's FH and SH was reviewed, including their home life, tobacco status, and drug and alcohol status.   Vaccines: Due for Tdap, PCV 13/23 ( wil do today)  and zostavax.. Refused today  Colon:  Refused, no family history of colon cancer.Marland Kitchen Has blood with constipation and hemorrhoids..cologuard and stool cards would not be helpful.  Nonsmoker Hep C neg  PSA stable  Lab Results  Component Value Date   PSA 0.61 07/08/2016   PSA 0.80 05/25/2015   PSA 0.71 04/20/2014   Alcohol 3 per week

## 2016-07-11 NOTE — Patient Instructions (Addendum)
Work on low carbohydrate diet.. Stop soda, stop sweet tea.  Increase water as able. Decrease beer and wince intake.  Start regular exercise, walk 3-5 days a week.  Schedule yearly eye exam.

## 2016-07-11 NOTE — Assessment & Plan Note (Signed)
Well controlled. Continue current medication.  

## 2016-07-11 NOTE — Assessment & Plan Note (Signed)
Likely BPH.. Does not bother pt much. No indication for treatment.

## 2016-08-18 ENCOUNTER — Other Ambulatory Visit: Payer: Self-pay | Admitting: Family Medicine

## 2016-10-08 DIAGNOSIS — R69 Illness, unspecified: Secondary | ICD-10-CM | POA: Diagnosis not present

## 2017-01-12 DIAGNOSIS — R69 Illness, unspecified: Secondary | ICD-10-CM | POA: Diagnosis not present

## 2017-03-15 ENCOUNTER — Other Ambulatory Visit: Payer: Self-pay | Admitting: Cardiovascular Disease

## 2017-03-23 ENCOUNTER — Other Ambulatory Visit: Payer: Self-pay | Admitting: Cardiovascular Disease

## 2017-03-26 ENCOUNTER — Other Ambulatory Visit: Payer: Self-pay | Admitting: Cardiovascular Disease

## 2017-03-28 NOTE — Progress Notes (Signed)
Patient ID: Roy [name], male   DOB: 1945-02-06, 72 y.o.   MRN: [id number]    Cardiology Office Note   Date:  03/30/2017   ID:  Roy [name], Roy [name] [birth year], MRN [id number]  PCP:  Jinny Sanders, MD  Cardiologist:  Dr. Jenkins Rouge     No chief complaint on file.    History of Present Illness:.    72 y.o. distant stent to RCA in 2001. Had STEMI during stres test 2016 with DES to Diagonal. Had residual PLB disease Rx medically  Ranexa not much benefit. Last myovue 03/26/16 non ischemic EF 58% Cramps with statin improved with CoQ and lower dose   Has significant depression despite being on Effexor. Back from annual trip to [place] and did not enjoy it this time. Has ex wife and one son in [place] one in [place] but no daily contact. Seeing primary tomorrow and will need further RX  Studies/Reports Reviewed Today:  LHC/PCI 04/24/14 1. Left main; 20% distal   2. LAD: Very small D1 50-70% ostial, D2 subtotally occluded 3. LCx:  50-70% ostial stenosis.   4. RCA: Patent stent in the distal portion; 90% distal PLB 5. LVEF 35-40%  Severe anterolateral/anteroapical hypokinesia. PCI:  Synergy DES 2.25 x 20 mm to D2  Echo 04/25/14 - Mild LVH. EF 55% to 60%.  Wall motion was normal; Grade 1 diastolic dysfunction). - Mitral valve: Calcified annulus. Impressions:   Normal LV function; grade 1 diastolic dysfunction; trace MR and TR.  Myovue 03/26/16 old IMI no ischemia EF 58%   Past Medical History:  Diagnosis Date  . Basal cell carcinoma    Bilateral Legs  . CAD (coronary artery disease)    a.  stent to the RCA in 2001;  b.  Negative Myoview in January 2012;  c.  LHC 4/16:  dLM 20, small D1 50-70, D2 sub-total, oLCx 50-70, RCA stent ok, dPLB 90, EF 35-40% >> DES to D2  . Depression   . History of echocardiogram    a.  Echo 4/16: Mild LVH, EF 89-38%, grade 1 diastolic dysfunction, normal wall motion, MAC  . HLD (hyperlipidemia)   . HTN (hypertension)   . OSA (obstructive  sleep apnea)   . Vertigo     Past Surgical History:  Procedure Laterality Date  . CATARACT EXTRACTION, BILATERAL    . CORONARY STENT PLACEMENT  2001    RCA  . LEFT HEART CATHETERIZATION WITH CORONARY ANGIOGRAM N/A 02/13/2012   Procedure: LEFT HEART CATHETERIZATION WITH CORONARY ANGIOGRAM;  Surgeon: Carsen Machi M Martinique, MD;  Location: [facility] LAB;  Service: Cardiovascular;  Laterality: N/A;  . LEFT HEART CATHETERIZATION WITH CORONARY ANGIOGRAM N/A 04/24/2014   Procedure: LEFT HEART CATHETERIZATION WITH CORONARY ANGIOGRAM;  Surgeon: [name], MD;  Location: [facility] C[facility] LAB;  Service: Cardiovascular;  Laterality: N/A;  . TONSILLECTOMY       Current Outpatient Medications  Medication Sig Dispense Refill  . ALPRAZolam (XANAX) 0.25 MG tablet Take 0.25 mg by mouth at bedtime as needed for sleep.    Marland Kitchen atorvastatin (LIPITOR) 40 MG tablet TAKE 1 TABLET (40 MG TOTAL) BY MOUTH AT BEDTIME. 90 tablet 0  . carvedilol (COREG) 12.5 MG tablet TAKE 1 TABLET (12.5 MG TOTAL) BY MOUTH 2 (TWO) TIMES DAILY. 180 tablet 0  . clopidogrel (PLAVIX) 75 MG tablet Take 1 tablet (75 mg total) by mouth daily. 90 tablet 3  . Coenzyme Q10 (CO Q 10 PO) Take 1 tablet by mouth daily.    Marland Kitchen  isosorbide mononitrate (IMDUR) 60 MG 24 hr tablet Take 1 tablet (60 mg total) by mouth daily. 90 tablet 3  . lisinopril (PRINIVIL,ZESTRIL) 5 MG tablet TAKE 1 TABLET BY MOUTH EVERY DAY 90 tablet 3  . Magnesium Oxide 200 MG TABS Take 200 mg by mouth daily.    . nitroGLYCERIN (NITROSTAT) 0.4 MG SL tablet PLACE 1 TABLET UNDER THE TONGUE EVERY 5 MINUTES AS NEEDED FOR CHEST PAIN. MAX OF 3 DOSES 25 tablet 0  . omeprazole (PRILOSEC) 20 MG capsule Take 1 capsule (20 mg total) by mouth daily. Please keep upcoming appt for future refills. Thank you 90 capsule 0  . venlafaxine XR (EFFEXOR-XR) 75 MG 24 hr capsule Take 1 capsule (75 mg total) by mouth daily with breakfast. 90 capsule 3   No current facility-administered medications for this visit.      Allergies:   Patient has no known allergies.    Social History:  The patient  reports that he quit smoking about 17 years ago. His smoking use included cigarettes. He has a 25.00 pack-year smoking history. he has never used smokeless tobacco. He reports that he drinks about 3.0 oz of alcohol per week. He reports that he does not use drugs.   Family History:  The patient's family history includes Alcohol abuse in his mother; Cirrhosis in his mother; Depression in his sister; Heart disease in his father; Hypertension in his father; Peripheral vascular disease in his father.    ROS:   Please see the history of present illness.   Review of Systems  All other systems reviewed and are negative.    PHYSICAL EXAM: VS:  BP 100/74   Pulse 82   Ht 5' 6"  (1.676 m)   Wt 180 lb 8 oz (81.9 kg)   SpO2 96%   BMI 29.13 kg/m     Wt Readings from Last 3 Encounters:  03/30/17 180 lb 8 oz (81.9 kg)  07/11/16 186 lb 8 oz (84.6 kg)  07/08/16 186 lb 8 oz (84.6 kg)    Affect appropriate Healthy:  appears stated age HEENT: normal Neck supple with no adenopathy JVP normal no bruits no thyromegaly Lungs clear with no wheezing and good diaphragmatic motion Heart:  S1/S2 no murmur, no rub, gallop or click PMI normal Abdomen: benighn, BS positve, no tenderness, no AAA no bruit.  No HSM or HJR Distal pulses intact with no bruits No edema Neuro non-focal Skin warm and dry No muscular weakness    EKG:  07/26/14    NSR, HR 70, no change from last tracing.   04/02/15  ST rate 111 LAFB  Poor R wave progression   03/19/16  SR rate 61 PAC otherwise normal 03/30/17  SR rate 71 normal   Recent Labs: 07/08/2016: ALT 34; BUN 12; Creatinine, Ser 1.10; Hemoglobin 14.7; Platelets 226.0; Potassium 5.4; Sodium 141; TSH 1.63    Lipid Panel    Component Value Date/Time   CHOL 174 03/26/2016 0736   TRIG 347 (H) 03/26/2016 0736   TRIG 130 12/08/2005 0741   HDL 40 03/26/2016 0736   CHOLHDL 4.4 03/26/2016 0736    CHOLHDL 3.4 10/03/2015 0801   VLDL 64 (H) 10/03/2015 0801   LDLCALC 65 03/26/2016 0736   LDLDIRECT 85.0 05/25/2015 0859      ASSESSMENT AND PLAN:  1.  CAD: Distant stent to RCA.    DES to D2 04/2014 Non ischemic myovue 03/26/16 EF 58%   . Continue aspirin, Lipitor, Coreg, Plavix, Imdur, lisinopril.  2.  Essential hypertension:  Well controlled.  Continue current medications and low sodium Dash type diet.     3.  Dyslipidemia:  conitnue statin  Lab Results  Component Value Date   LDLCALC 65 03/26/2016    4. Tachycardia:  Improved on beta blocker  TSH noraml 07/08/16   5. Depression:  Seems much worse f/u primary consider counseling and alternative SSRI   Medication Adjustments: Current medicines are reviewed at length with the patient today.  Concerns regarding medicines are as outlined above.  The following changes have been made:   Discontinued Medications   No medications on file   Modified Medications   No medications on file   New Prescriptions   No medications on file    Labs/ tests ordered today include:  Exercise Myovue   Disposition:   FU with me  In a year    Jenkins Rouge

## 2017-03-30 ENCOUNTER — Other Ambulatory Visit: Payer: Self-pay | Admitting: Cardiovascular Disease

## 2017-03-30 ENCOUNTER — Ambulatory Visit: Payer: Medicare HMO | Admitting: Cardiovascular Disease

## 2017-03-30 ENCOUNTER — Encounter: Payer: Self-pay | Admitting: Cardiovascular Disease

## 2017-03-30 VITALS — BP 100/74 | HR 82 | Ht 66.0 in | Wt 180.5 lb

## 2017-03-30 DIAGNOSIS — I10 Essential (primary) hypertension: Secondary | ICD-10-CM

## 2017-03-30 DIAGNOSIS — E785 Hyperlipidemia, unspecified: Secondary | ICD-10-CM | POA: Diagnosis not present

## 2017-03-30 DIAGNOSIS — R000 Tachycardia, unspecified: Secondary | ICD-10-CM

## 2017-03-30 DIAGNOSIS — I25119 Atherosclerotic heart disease of native coronary artery with unspecified angina pectoris: Secondary | ICD-10-CM

## 2017-03-30 NOTE — Patient Instructions (Addendum)

## 2017-03-31 ENCOUNTER — Encounter: Payer: Self-pay | Admitting: Family Medicine

## 2017-03-31 ENCOUNTER — Ambulatory Visit (INDEPENDENT_AMBULATORY_CARE_PROVIDER_SITE_OTHER): Payer: Medicare HMO | Admitting: Family Medicine

## 2017-03-31 ENCOUNTER — Other Ambulatory Visit: Payer: Self-pay

## 2017-03-31 VITALS — BP 90/64 | HR 86 | Temp 98.2°F | Ht 65.5 in | Wt 182.8 lb

## 2017-03-31 DIAGNOSIS — R531 Weakness: Secondary | ICD-10-CM | POA: Insufficient documentation

## 2017-03-31 DIAGNOSIS — R5382 Chronic fatigue, unspecified: Secondary | ICD-10-CM | POA: Diagnosis not present

## 2017-03-31 DIAGNOSIS — R197 Diarrhea, unspecified: Secondary | ICD-10-CM | POA: Diagnosis not present

## 2017-03-31 DIAGNOSIS — E119 Type 2 diabetes mellitus without complications: Secondary | ICD-10-CM

## 2017-03-31 DIAGNOSIS — R05 Cough: Secondary | ICD-10-CM | POA: Diagnosis not present

## 2017-03-31 DIAGNOSIS — H9311 Tinnitus, right ear: Secondary | ICD-10-CM | POA: Insufficient documentation

## 2017-03-31 DIAGNOSIS — R059 Cough, unspecified: Secondary | ICD-10-CM | POA: Insufficient documentation

## 2017-03-31 LAB — T3, FREE: T3, Free: 3.2 pg/mL (ref 2.3–4.2)

## 2017-03-31 LAB — COMPREHENSIVE METABOLIC PANEL
ALK PHOS: 75 U/L (ref 39–117)
ALT: 27 U/L (ref 0–53)
AST: 19 U/L (ref 0–37)
Albumin: 3.5 g/dL (ref 3.5–5.2)
BILIRUBIN TOTAL: 0.5 mg/dL (ref 0.2–1.2)
BUN: 8 mg/dL (ref 6–23)
CO2: 29 mEq/L (ref 19–32)
Calcium: 8.8 mg/dL (ref 8.4–10.5)
Chloride: 102 mEq/L (ref 96–112)
Creatinine, Ser: 1.12 mg/dL (ref 0.40–1.50)
GFR: 68.67 mL/min (ref >60.00)
GLUCOSE: 121 mg/dL — AB (ref 70–99)
Potassium: 4.4 mEq/L (ref 3.5–5.1)
SODIUM: 140 meq/L (ref 135–145)
TOTAL PROTEIN: 6.5 g/dL (ref 6.0–8.3)

## 2017-03-31 LAB — CBC WITH DIFFERENTIAL/PLATELET
BASOS ABS: 0 10*3/uL (ref 0.0–0.1)
Basophils Relative: 0.3 % (ref 0.0–3.0)
EOS ABS: 0.2 10*3/uL (ref 0.0–0.7)
Eosinophils Relative: 2.6 % (ref 0.0–5.0)
HCT: 37.8 % — ABNORMAL LOW (ref 39.0–52.0)
HEMOGLOBIN: 13.1 g/dL (ref 13.0–17.0)
Lymphocytes Relative: 18 % (ref 12.0–46.0)
Lymphs Abs: 1.1 10*3/uL (ref 0.7–4.0)
MCHC: 34.6 g/dL (ref 30.0–36.0)
MCV: 91.7 fl (ref 78.0–100.0)
MONO ABS: 0.8 10*3/uL (ref 0.1–1.0)
Monocytes Relative: 13.1 % — ABNORMAL HIGH (ref 3.0–12.0)
Neutro Abs: 4.2 10*3/uL (ref 1.4–7.7)
Neutrophils Relative %: 66 % (ref 43.0–77.0)
Platelets: 208 10*3/uL (ref 150.0–400.0)
RBC: 4.12 Mil/uL — AB (ref 4.22–5.81)
RDW: 13.2 % (ref 11.5–15.5)
WBC: 6.3 10*3/uL (ref 4.0–10.5)

## 2017-03-31 LAB — TSH: TSH: 1.83 u[IU]/mL (ref 0.35–4.50)

## 2017-03-31 LAB — HEMOGLOBIN A1C: Hgb A1c MFr Bld: 6.6 % — ABNORMAL HIGH (ref 4.6–6.5)

## 2017-03-31 LAB — VITAMIN B12: VITAMIN B 12: 393 pg/mL (ref 211–911)

## 2017-03-31 LAB — T4, FREE: FREE T4: 0.79 ng/dL (ref 0.60–1.60)

## 2017-03-31 MED ORDER — GUAIFENESIN-CODEINE 100-10 MG/5ML PO SYRP: 5.0000 mL | ORAL_SOLUTION | Freq: Every evening | ORAL | 0 refills | Status: DC | PRN

## 2017-03-31 NOTE — Assessment & Plan Note (Signed)
May be due to mood.. But will eval for other etiology.  If neg labs.. Consider referral for counseling and or increase in venlafaxine.

## 2017-03-31 NOTE — Assessment & Plan Note (Signed)
Most consistent with viral URi. Cough suppressant prn.

## 2017-03-31 NOTE — Assessment & Plan Note (Signed)
OV due for eval.

## 2017-03-31 NOTE — Assessment & Plan Note (Signed)
May be due to hearing loss ETD or wax. Moderate cerumen in right ear.  Start nasal flonase for ETD.

## 2017-03-31 NOTE — Patient Instructions (Addendum)
Please stop at the lab to have labs drawn.  Start flonase 2 sprays per nostril daily.  Can use mucinex DM during the day and prescription at night.  call if new fever late in illness, or worsening shortness of breath.

## 2017-03-31 NOTE — Assessment & Plan Note (Signed)
Likely resolving infectious cause. Pt not interested in work up at this time.

## 2017-03-31 NOTE — Progress Notes (Signed)
Subjective:    Patient ID: Roy [name], male    DOB: 1945/04/22, 72 y.o.   MRN: [id number]  HPI   72 year old male with history of DM, CAD, HTN presents with multiple symptoms  1.  Fatigue  and anhedonia, less interested in doing things ongoing x  Several month  Hx of major recurrent depression.. On venlafaxine   2. Some decreased appetite in last month given eating causes diarrhea... Improved some with immodium off and on.  Got back from [place] and symptoms started after being there.  no fever, no blood in stool.   He is not interested in further work up as it is improving.  2. Dripping sound in  right ear off and on in last several weeks. does not sound like heartbeat.  mild right ear ache and itchy right ear.  Has hx of hearing loss and hearing aids.  3. Cough x 2 days, keeping him up at night  Using mucinex DM prn. No fever but did have chills.  Stomach ache from coughing. Intermittant  Wheeze,  No overt SOB.   Former remote smoker.. No COPD.    Review of Systems  Constitutional: Positive for fatigue. Negative for fever.  HENT: Positive for ear pain, postnasal drip and rhinorrhea.   Eyes: Negative for pain.  Respiratory: Positive for cough and wheezing. Negative for chest tightness and shortness of breath.   Cardiovascular: Negative for chest pain.  Gastrointestinal: Positive for diarrhea. Negative for nausea, rectal pain and vomiting.  Genitourinary: Negative for dysuria.  Musculoskeletal: Negative for myalgias.       Objective:   Physical Exam  Constitutional: Vital signs are normal. He appears well-developed and well-nourished.  Non-toxic appearance. He does not appear ill. No distress.  HENT:  Head: Normocephalic and atraumatic.  Right Ear: Hearing, tympanic membrane, external ear and ear canal normal. No tenderness. No foreign bodies. Tympanic membrane is not retracted and not bulging.  Left Ear: Hearing, tympanic membrane, external ear and ear  canal normal. No tenderness. No foreign bodies. Tympanic membrane is not retracted and not bulging.  Nose: Nose normal. No mucosal edema or rhinorrhea. Right sinus exhibits no maxillary sinus tenderness and no frontal sinus tenderness. Left sinus exhibits no maxillary sinus tenderness and no frontal sinus tenderness.  Mouth/Throat: Uvula is midline, oropharynx is clear and moist and mucous membranes are normal. Normal dentition. No dental caries. No oropharyngeal exudate or tonsillar abscesses.  Eyes: Conjunctivae, EOM and lids are normal. Pupils are equal, round, and reactive to light. Lids are everted and swept, no foreign bodies found.  Neck: Trachea normal, normal range of motion and phonation normal. Neck supple. Carotid bruit is not present. No thyroid mass and no thyromegaly present.  Cardiovascular: Normal rate, regular rhythm, S1 normal, S2 normal, normal heart sounds, intact distal pulses and normal pulses. Exam reveals no gallop, no distant heart sounds and no friction rub.  No murmur heard. No peripheral edema  Pulmonary/Chest: Effort normal and breath sounds normal. No respiratory distress. He has no wheezes. He has no rhonchi. He has no rales.  Abdominal: Soft. Normal appearance and bowel sounds are normal. There is no hepatosplenomegaly. There is no tenderness. There is no rebound, no guarding and no CVA tenderness. No hernia.  Neurological: He is alert. He has normal reflexes.  Skin: Skin is warm, dry and intact. No rash noted.  Psychiatric: He has a normal mood and affect. His speech is normal and behavior is normal. Judgment and  thought content normal.          Assessment & Plan:

## 2017-04-01 DIAGNOSIS — Z0100 Encounter for examination of eyes and vision without abnormal findings: Secondary | ICD-10-CM | POA: Diagnosis not present

## 2017-04-06 LAB — TESTOS,TOTAL,FREE AND SHBG (FEMALE)
Free Testosterone: 19.8 pg/mL — ABNORMAL LOW (ref 30.0–135.0)
SEX HORMONE BINDING: 74 nmol/L (ref 22–77)
Testosterone, Total, LC-MS-MS: 347 ng/dL (ref 250–1100)

## 2017-04-23 ENCOUNTER — Other Ambulatory Visit: Payer: Self-pay | Admitting: Cardiovascular Disease

## 2017-04-23 DIAGNOSIS — I10 Essential (primary) hypertension: Secondary | ICD-10-CM | POA: Diagnosis not present

## 2017-04-23 DIAGNOSIS — I25119 Atherosclerotic heart disease of native coronary artery with unspecified angina pectoris: Secondary | ICD-10-CM | POA: Diagnosis not present

## 2017-04-23 DIAGNOSIS — N529 Male erectile dysfunction, unspecified: Secondary | ICD-10-CM | POA: Diagnosis not present

## 2017-04-23 DIAGNOSIS — K5900 Constipation, unspecified: Secondary | ICD-10-CM | POA: Diagnosis not present

## 2017-04-23 DIAGNOSIS — E785 Hyperlipidemia, unspecified: Secondary | ICD-10-CM | POA: Diagnosis not present

## 2017-04-23 DIAGNOSIS — K219 Gastro-esophageal reflux disease without esophagitis: Secondary | ICD-10-CM | POA: Diagnosis not present

## 2017-04-23 DIAGNOSIS — G4730 Sleep apnea, unspecified: Secondary | ICD-10-CM | POA: Diagnosis not present

## 2017-04-23 DIAGNOSIS — R69 Illness, unspecified: Secondary | ICD-10-CM | POA: Diagnosis not present

## 2017-04-23 MED ORDER — CLOPIDOGREL BISULFATE 75 MG PO TABS: 75.0000 mg | ORAL_TABLET | Freq: Every day | ORAL | 3 refills | Status: DC

## 2017-04-27 ENCOUNTER — Encounter: Payer: Self-pay | Admitting: Family Medicine

## 2017-05-12 ENCOUNTER — Encounter: Payer: Self-pay | Admitting: *Deleted

## 2017-05-12 NOTE — Telephone Encounter (Signed)
Letter mailed to Mr. Swartz as instructed by Dr. Diona Browner since patient has not read response on MyChart.

## 2017-06-09 ENCOUNTER — Ambulatory Visit: Payer: Medicare HMO | Admitting: Internal Medicine

## 2017-06-09 ENCOUNTER — Encounter: Payer: Self-pay | Admitting: Internal Medicine

## 2017-06-09 VITALS — BP 112/76 | HR 83 | Temp 98.2°F | Wt 178.0 lb

## 2017-06-09 DIAGNOSIS — L551 Sunburn of second degree: Secondary | ICD-10-CM

## 2017-06-09 MED ORDER — SILVER SULFADIAZINE 1 % EX CREA: 1.0000 "application " | TOPICAL_CREAM | Freq: Every day | CUTANEOUS | 0 refills | Status: DC

## 2017-06-09 NOTE — Patient Instructions (Signed)
Sunburn Sunburn is damage to the skin that is caused by getting too much sun. Getting sunburned over and over can cause wrinkles and dark spots on the skin (sun spots). It can also increase your chance of getting skin cancer. Follow these instructions at home: Medicines  Take or apply over-the-counter and prescription medicines only as told by your doctor.  If you were prescribed an antibiotic medicine, use it as told by your doctor. Do not stop using the antibiotic even if your condition improves. General instructions  Avoid being in the sun. Wear clothing that covers your sunburn.  Do not put ice on your sunburn. Try taking a cool bath or putting a cool, wet cloth (cool compress) on your skin. This may help with pain.  Drink enough fluid to keep your pee (urine) clear or pale yellow.  Try applying aloe vera or a moisturizer that has soy in it to your sunburn. This may help your pain. Do not do this if you have blisters.  Do not break any blisters if you have them. How is this prevented? To keep from getting sunburned:  Try to stay out of the sun between 10:00 a.m. and 2:00 p.m. This is when the sun is the strongest.  Put on sunscreen at least 15 minutes before you go out in the sun.  Use a sunscreen with an SPF of 15 or higher. If you will be in the sun for a long time, think about using an SPF of 30 or higher. Use a sunscreen that protects against all of the sun's rays (broad-spectrum) and is water-resistant.  Put sunscreen on again: ? About every two hours while you are in the sun. ? More often if you are sweating a lot while you are in the sun. ? After you get wet from swimming or playing in water.  Wear long sleeves, a hat, and sunglasses when you are outside.  Talk with your doctor about medicines, herbs, and foods that can make you more sensitive to light. Avoid these, if possible.  Do not use tanning beds.  Contact a doctor if:  You have a fever.  Your symptoms do  not get better with treatment.  Medicine does not help your pain.  Your burn becomes more painful and swollen. Get help right away if:  You start to throw up (vomit).  You start to have watery poop (diarrhea).  You feel faint.  You pass out.  You have a headache and you feel confused.  You have very bad blisters.  You have pus or fluid coming from the blisters. This information is not intended to replace advice given to you by your health care provider. Make sure you discuss any questions you have with your health care provider. Document Released: 09/11/2010 Document Revised: 08/29/2015 Document Reviewed: 07/03/2014 Elsevier Interactive Patient Education  2018 [employer]  

## 2017-06-09 NOTE — Progress Notes (Signed)
Subjective:    Patient ID: Roy [name], male    DOB: 05-09-1945, 72 y.o.   MRN: [id number]  HPI  Pt presents to the clinic today with c/o sunburn to bilateral feet. He report this occurred over the weekend. He has developed large blisters on bilateral feet. The blister on the bottom of the left foot has drained clear fluid. He has pain with ambulation. He has tried Ibuprofen and Lidocaine with minimal relief.  Review of Systems      Past Medical History:  Diagnosis Date  . Basal cell carcinoma    Bilateral Legs  . CAD (coronary artery disease)    a.  stent to the RCA in 2001;  b.  Negative Myoview in January 2012;  c.  LHC 4/16:  dLM 20, small D1 50-70, D2 sub-total, oLCx 50-70, RCA stent ok, dPLB 90, EF 35-40% >> DES to D2  . Depression   . History of echocardiogram    a.  Echo 4/16: Mild LVH, EF 58-30%, grade 1 diastolic dysfunction, normal wall motion, MAC  . HLD (hyperlipidemia)   . HTN (hypertension)   . OSA (obstructive sleep apnea)   . Vertigo     Current Outpatient Medications  Medication Sig Dispense Refill  . ALPRAZolam (XANAX) 0.25 MG tablet Take 0.25 mg by mouth at bedtime as needed for sleep.    Marland Kitchen atorvastatin (LIPITOR) 40 MG tablet TAKE 1 TABLET (40 MG TOTAL) BY MOUTH AT BEDTIME. 90 tablet 0  . carvedilol (COREG) 12.5 MG tablet TAKE 1 TABLET (12.5 MG TOTAL) BY MOUTH 2 (TWO) TIMES DAILY. 180 tablet 0  . clopidogrel (PLAVIX) 75 MG tablet Take 1 tablet (75 mg total) by mouth daily. 90 tablet 3  . Coenzyme Q10 (CO Q 10 PO) Take 1 tablet by mouth daily.    . isosorbide mononitrate (IMDUR) 60 MG 24 hr tablet TAKE 1 TABLET (60 MG TOTAL) BY MOUTH DAILY. 90 tablet 3  . lisinopril (PRINIVIL,ZESTRIL) 5 MG tablet TAKE 1 TABLET BY MOUTH EVERY DAY 90 tablet 3  . Magnesium Oxide 200 MG TABS Take 200 mg by mouth daily.    . nitroGLYCERIN (NITROSTAT) 0.4 MG SL tablet PLACE 1 TABLET UNDER THE TONGUE EVERY 5 MINUTES AS NEEDED FOR CHEST PAIN. MAX OF 3 DOSES 25 tablet 0  .  omeprazole (PRILOSEC) 20 MG capsule Take 1 capsule (20 mg total) by mouth daily. Please keep upcoming appt for future refills. Thank you 90 capsule 0  . venlafaxine XR (EFFEXOR-XR) 75 MG 24 hr capsule Take 1 capsule (75 mg total) by mouth daily with breakfast. 90 capsule 3  . silver sulfADIAZINE (SILVADENE) 1 % cream Apply 1 application topically daily. 50 g 0   No current facility-administered medications for this visit.     No Known Allergies  Family History  Problem Relation Age of Onset  . Cirrhosis Mother   . Alcohol abuse Mother   . Heart disease Father   . Hypertension Father   . Peripheral vascular disease Father   . Depression Sister   . Heart attack Neg Hx   . Stroke Neg Hx     Social History   Socioeconomic History  . Marital status: Legally Separated    Spouse name: Not on file  . Number of children: Not on file  . Years of education: Not on file  . Highest education level: Not on file  Occupational History  . Not on file  Social Needs  . Financial resource strain: Not  on file  . Food insecurity:    Worry: Not on file    Inability: Not on file  . Transportation needs:    Medical: Not on file    Non-medical: Not on file  Tobacco Use  . Smoking status: Former Smoker    Packs/day: 1.00    Years: 25.00    Pack years: 25.00    Types: Cigarettes    Last attempt to quit: 01/14/2000    Years since quitting: 17.4  . Smokeless tobacco: Never Used  Substance and Sexual Activity  . Alcohol use: Yes    Alcohol/week: 3.0 oz    Types: 5 Standard drinks or equivalent per week  . Drug use: No  . Sexual activity: Not Currently  Lifestyle  . Physical activity:    Days per week: Not on file    Minutes per session: Not on file  . Stress: Not on file  Relationships  . Social connections:    Talks on phone: Not on file    Gets together: Not on file    Attends religious service: Not on file    Active member of club or organization: Not on file    Attends meetings of  clubs or organizations: Not on file    Relationship status: Not on file  . Intimate partner violence:    Fear of current or ex partner: Not on file    Emotionally abused: Not on file    Physically abused: Not on file    Forced sexual activity: Not on file  Other Topics Concern  . 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     Constitutional: Denies fever, malaise, fatigue, headache or abrupt weight changes.  Skin: Pt reports sunburn of bilateral feet. Denies rashes, lesions or ulcercations.    No other specific complaints in a complete review of systems (except as listed in HPI above).  Objective:   Physical Exam  BP 112/76   Pulse 83   Temp 98.2 F (36.8 C) (Oral)   Wt 178 lb (80.7 kg)   SpO2 98%   BMI 29.17 kg/m  Wt Readings from Last 3 Encounters:  06/09/17 178 lb (80.7 kg)  03/31/17 182 lb 12 oz (82.9 kg)  03/30/17 180 lb 8 oz (81.9 kg)    General: Appears her stated age, well developed, well nourished in NAD. Skin: Large blisters cover 2/3 of plantar surfaces of bilateral feet.   BMET    Component Value Date/Time   NA 140 03/31/2017 1141   K 4.4 03/31/2017 1141   CL 102 03/31/2017 1141   CO2 29 03/31/2017 1141   GLUCOSE 121 (H) 03/31/2017 1141   GLUCOSE 118 (H) 12/08/2005 0741   BUN 8 03/31/2017 1141   CREATININE 1.12 03/31/2017 1141   CREATININE 1.11 04/02/2015 1514   CALCIUM 8.8 03/31/2017 1141   GFRNONAA 83 (L) 04/26/2014 0355   GFRAA >90 04/26/2014 0355    Lipid Panel     Component Value Date/Time   CHOL 174 03/26/2016 0736   TRIG 347 (H) 03/26/2016 0736   TRIG 130 12/08/2005 0741   HDL 40 03/26/2016 0736   CHOLHDL 4.4 03/26/2016 0736   CHOLHDL 3.4 10/03/2015 0801   VLDL 64 (H) 10/03/2015 0801   LDLCALC 65 03/26/2016 0736    CBC    Component Value Date/Time   WBC 6.3 03/31/2017 1141  RBC 4.12 (L) 03/31/2017 1141   HGB 13.1  03/31/2017 1141   HCT 37.8 (L) 03/31/2017 1141   PLT 208.0 03/31/2017 1141   MCV 91.7 03/31/2017 1141   MCH 31.5 04/26/2014 0355   MCHC 34.6 03/31/2017 1141   RDW 13.2 03/31/2017 1141   LYMPHSABS 1.1 03/31/2017 1141   MONOABS 0.8 03/31/2017 1141   EOSABS 0.2 03/31/2017 1141   BASOSABS 0.0 03/31/2017 1141    Hgb A1C Lab Results  Component Value Date   HGBA1C 6.6 (H) 03/31/2017            Assessment & Plan:   Sunburn of Feet:  eRx for Silvadene cream- use as directed Discussed avoiding disturbing the blisters as this can lead to infection  Return precautions discussed Webb Silversmith, NP

## 2017-06-14 ENCOUNTER — Other Ambulatory Visit: Payer: Self-pay | Admitting: Cardiovascular Disease

## 2017-06-16 DIAGNOSIS — H903 Sensorineural hearing loss, bilateral: Secondary | ICD-10-CM | POA: Diagnosis not present

## 2017-06-16 DIAGNOSIS — H6123 Impacted cerumen, bilateral: Secondary | ICD-10-CM | POA: Diagnosis not present

## 2017-06-18 ENCOUNTER — Other Ambulatory Visit: Payer: Self-pay | Admitting: Cardiovascular Disease

## 2017-07-09 ENCOUNTER — Ambulatory Visit: Payer: Medicare HMO

## 2017-07-17 ENCOUNTER — Encounter: Payer: Medicare HMO | Admitting: Family Medicine

## 2017-07-17 DIAGNOSIS — Z0289 Encounter for other administrative examinations: Secondary | ICD-10-CM

## 2017-07-22 DIAGNOSIS — R69 Illness, unspecified: Secondary | ICD-10-CM | POA: Diagnosis not present

## 2017-07-30 ENCOUNTER — Other Ambulatory Visit: Payer: Self-pay | Admitting: Family Medicine

## 2017-08-27 ENCOUNTER — Other Ambulatory Visit: Payer: Self-pay | Admitting: Family Medicine

## 2017-08-27 NOTE — Telephone Encounter (Signed)
Last office visit 06/09/2017 with R. Baity for Sunburn.  Last AVS when seen by Dr. Diona Browner on 03/31/2017  states to return in 3 months for [place].  No future appointments.  Last refill states patient must schedule office visit.  Refill?

## 2017-08-27 NOTE — Telephone Encounter (Signed)
Robin, please schedule CPE with fasting labs prior.  Must schedule appointment before his medication can be refilled.  Please send back to Butch Penny once appointment is made for her to refill until appt. Thanks!

## 2017-08-27 NOTE — Telephone Encounter (Signed)
Medicare wellness with lisa 10/25 cpx 10/29 Pt aware

## 2017-09-16 DIAGNOSIS — R69 Illness, unspecified: Secondary | ICD-10-CM | POA: Diagnosis not present

## 2017-11-06 ENCOUNTER — Telehealth: Payer: Self-pay | Admitting: Family Medicine

## 2017-11-06 ENCOUNTER — Ambulatory Visit (INDEPENDENT_AMBULATORY_CARE_PROVIDER_SITE_OTHER): Payer: Medicare HMO

## 2017-11-06 VITALS — BP 90/70 | HR 93 | Temp 98.5°F | Ht 66.5 in | Wt 189.5 lb

## 2017-11-06 DIAGNOSIS — Z0000 Encounter for general adult medical examination without abnormal findings: Secondary | ICD-10-CM | POA: Diagnosis not present

## 2017-11-06 DIAGNOSIS — E119 Type 2 diabetes mellitus without complications: Secondary | ICD-10-CM | POA: Diagnosis not present

## 2017-11-06 LAB — COMPREHENSIVE METABOLIC PANEL
ALBUMIN: 4.2 g/dL (ref 3.5–5.2)
ALT: 32 U/L (ref 0–53)
AST: 16 U/L (ref 0–37)
Alkaline Phosphatase: 60 U/L (ref 39–117)
BUN: 14 mg/dL (ref 6–23)
CHLORIDE: 102 meq/L (ref 96–112)
CO2: 29 mEq/L (ref 19–32)
Calcium: 9.3 mg/dL (ref 8.4–10.5)
Creatinine, Ser: 1.22 mg/dL (ref 0.40–1.50)
GFR: 62.11 mL/min (ref >60.00)
GLUCOSE: 146 mg/dL — AB (ref 70–99)
POTASSIUM: 4.6 meq/L (ref 3.5–5.1)
SODIUM: 140 meq/L (ref 135–145)
Total Bilirubin: 0.7 mg/dL (ref 0.2–1.2)
Total Protein: 7.1 g/dL (ref 6.0–8.3)

## 2017-11-06 LAB — LDL CHOLESTEROL, DIRECT: LDL DIRECT: 89 mg/dL

## 2017-11-06 LAB — LIPID PANEL
CHOL/HDL RATIO: 3
CHOLESTEROL: 166 mg/dL (ref 0–200)
HDL: 52.3 mg/dL (ref >39.00)
NonHDL: 113.43
Triglycerides: 241 mg/dL — ABNORMAL HIGH (ref 0.0–149.0)
VLDL: 48.2 mg/dL — AB (ref 0.0–40.0)

## 2017-11-06 LAB — HEMOGLOBIN A1C: Hgb A1c MFr Bld: 6.2 % (ref 4.6–6.5)

## 2017-11-06 NOTE — Patient Instructions (Signed)
Roy [name] , Thank you for taking time to come for your Medicare Wellness Visit. I appreciate your ongoing commitment to your health goals. Please review the following plan we discussed and let me know if I can assist you in the future.   These are the goals we discussed: Goals    . Increase water intake     Starting 11/06/2017, I will attempt to drink at least 6-8 glasses of water daily.        This is a list of the screening recommended for you and due dates:  Health Maintenance  Topic Date Due  . Complete foot exam   11/10/2017*  . Colon Cancer Screening  01/13/2019*  . Tetanus Vaccine  01/13/2019*  . Hemoglobin A1C  05/08/2018  . Eye exam for diabetics  06/14/2018  . Flu Shot  Completed  .  Hepatitis C: One time screening is recommended by Center for Disease Control  (CDC) for  adults born from [birth year] through 1964.   Completed  . Pneumonia vaccines  Completed  *Topic was postponed. The date shown is not the original due date.   Preventive Care for Adults  A healthy lifestyle and preventive care can promote health and wellness. Preventive health guidelines for adults include the following key practices.  . A routine yearly physical is a good way to check with your health care provider about your health and preventive screening. It is a chance to share any concerns and updates on your health and to receive a thorough exam.  . Visit your dentist for a routine exam and preventive care every 6 months. Brush your teeth twice a day and floss once a day. Good oral hygiene prevents tooth decay and gum disease.  . The frequency of eye exams is based on your age, health, family medical history, use  of contact lenses, and other factors. Follow your health care provider's recommendations for frequency of eye exams.  . Eat a healthy diet. Foods like vegetables, fruits, whole grains, low-fat dairy products, and lean protein foods contain the nutrients you need without too many calories.  Decrease your intake of foods high in solid fats, added sugars, and salt. Eat the right amount of calories for you. Get information about a proper diet from your health care provider, if necessary.  . Regular physical exercise is one of the most important things you can do for your health. Most adults should get at least 150 minutes of moderate-intensity exercise (any activity that increases your heart rate and causes you to sweat) each week. In addition, most adults need muscle-strengthening exercises on 2 or more days a week.  Silver Sneakers may be a benefit available to you. To determine eligibility, you may visit the website: www.silversneakers.com or contact program at [phone] Mon-Fri between 8AM-8PM.   . Maintain a healthy weight. The body mass index (BMI) is a screening tool to identify possible weight problems. It provides an estimate of body fat based on height and weight. Your health care provider can find your BMI and can help you achieve or maintain a healthy weight.   For adults 20 years and older: ? A BMI below 18.5 is considered underweight. ? A BMI of 18.5 to 24.9 is normal. ? A BMI of 25 to 29.9 is considered overweight. ? A BMI of 30 and above is considered obese.   . Maintain normal blood lipids and cholesterol levels by exercising and minimizing your intake of saturated fat. Eat a balanced diet with plenty  of fruit and vegetables. Blood tests for lipids and cholesterol should begin at age 43 and be repeated every 5 years. If your lipid or cholesterol levels are high, you are over 50, or you are at high risk for heart disease, you may need your cholesterol levels checked more frequently. Ongoing high lipid and cholesterol levels should be treated with medicines if diet and exercise are not working.  . If you smoke, find out from your health care provider how to quit. If you do not use tobacco, please do not start.  . If you choose to drink alcohol, please do not consume  more than 2 drinks per day. One drink is considered to be 12 ounces (355 mL) of beer, 5 ounces (148 mL) of wine, or 1.5 ounces (44 mL) of liquor.  . If you are 32-47 years old, ask your health care provider if you should take aspirin to prevent strokes.  . Use sunscreen. Apply sunscreen liberally and repeatedly throughout the day. You should seek shade when your shadow is shorter than you. Protect yourself by wearing long sleeves, pants, a wide-brimmed hat, and sunglasses year round, whenever you are outdoors.  . Once a month, do a whole body skin exam, using a mirror to look at the skin on your back. Tell your health care provider of new moles, moles that have irregular borders, moles that are larger than a pencil eraser, or moles that have changed in shape or color.

## 2017-11-06 NOTE — Progress Notes (Signed)
Subjective:   Roy [name] is a 72 y.o. male who presents for Medicare Annual/Subsequent preventive examination.  Review of Systems:  N/A Cardiac Risk Factors include: advanced age (>36mn, >>38women);male gender;obesity (BMI >30kg/m2);dyslipidemia;hypertension     Objective:    Vitals: BP 90/70 (BP Location: Right Arm, Patient Position: Sitting, Cuff Size: Normal)   Pulse 93   Temp 98.5 F (36.9 C) (Oral)   Ht 5' 6.5" (1.689 m) Comment: shoes  Wt 189 lb 8 oz (86 kg)   SpO2 94%   BMI 30.13 kg/m   Body mass index is 30.13 kg/m.  Advanced Directives 11/06/2017 07/08/2016 06/01/2015 04/24/2014 02/13/2012  Does Patient Have a Medical Advance Directive? Yes Yes Yes Yes Patient has advance directive, copy not in chart  Type of Advance Directive [place]ow[place]an PointLiving will HFour OaksLiving will [place]woodLiving will Living will [place]itage HillsLiving will  Copy of [place] Chart? No - copy requested No - copy requested No - copy requested - -  Pre-existing out of facility DNR order (yellow form or pink MOST form) - - - - No    Tobacco Social History   Tobacco Use  Smoking Status Former Smoker  . Packs/day: 1.00  . Years: 25.00  . Pack years: 25.00  . Types: Cigarettes  . Last attempt to quit: 01/14/2000  . Years since quitting: 17.8  Smokeless Tobacco Never Used     Counseling given: No   Clinical Intake:  Pre-visit preparation completed: Yes  Pain : No/denies pain Pain Score: 0-No pain     Nutritional Status: BMI > 30  Obese Nutritional Risks: None Diabetes: Yes CBG done?: No Did pt. bring in CBG monitor from home?: No  How often do you need to have someone help you when you read instructions, pamphlets, or other written materials from your doctor or pharmacy?: 1 - Never What is the last grade level you completed in school?: Bachelor degree  Interpreter Needed?:  No  Comments: pt is single and lives alone Information entered by :: LPinson, LPN  Past Medical History:  Diagnosis Date  . Basal cell carcinoma    Bilateral Legs  . CAD (coronary artery disease)    a.  stent to the RCA in 2001;  b.  Negative Myoview in January 2012;  c.  LHC 4/16:  dLM 20, small D1 50-70, D2 sub-total, oLCx 50-70, RCA stent ok, dPLB 90, EF 35-40% >> DES to D2  . Depression   . History of echocardiogram    a.  Echo 4/16: Mild LVH, EF 594-80% grade 1 diastolic dysfunction, normal wall motion, MAC  . HLD (hyperlipidemia)   . HTN (hypertension)   . OSA (obstructive sleep apnea)   . Vertigo    Past Surgical History:  Procedure Laterality Date  . CATARACT EXTRACTION, BILATERAL    . CORONARY STENT PLACEMENT  2001    RCA  . LEFT HEART CATHETERIZATION WITH CORONARY ANGIOGRAM N/A 02/13/2012   Procedure: LEFT HEART CATHETERIZATION WITH CORONARY ANGIOGRAM;  Surgeon: Peter M JMartinique MD;  [facility];  Service: Cardiovascular;  Laterality: N/A;  . LEFT HEART CATHETERIZATION WITH CORONARY ANGIOGRAM N/A 04/24/2014   Procedure: LEFT HEART CATHETERIZATION WITH CORONARY ANGIOGRAM;  Surgeon: JLorretta Harp MD;  [facility];  Service: Cardiovascular;  Laterality: N/A;  . TONSILLECTOMY     Family History  Problem Relation Age of Onset  . Cirrhosis Mother   .  Alcohol abuse Mother   . Heart disease Father   . Hypertension Father   . Peripheral vascular disease Father   . Depression Sister   . Heart attack Neg Hx   . Stroke Neg Hx    Social History   Socioeconomic History  . Marital status: Legally Separated    Spouse name: Not on file  . Number of children: Not on file  . Years of education: Not on file  . Highest education level: Not on file  Occupational History  . Not on file  Social Needs  . Financial resource strain: Not on file  . Food insecurity:    Worry: Not on file    Inability: Not on file  . Transportation needs:    Medical: Not on  file    Non-medical: Not on file  Tobacco Use  . Smoking status: Former Smoker    Packs/day: 1.00    Years: 25.00    Pack years: 25.00    Types: Cigarettes    Last attempt to quit: 01/14/2000    Years since quitting: 17.8  . Smokeless tobacco: Never Used  Substance and Sexual Activity  . Alcohol use: Yes    Alcohol/week: 3.0 standard drinks    Types: 2 Shots of liquor, 1 Cans of beer per week  . Drug use: No  . Sexual activity: Not Currently  Lifestyle  . Physical activity:    Days per week: Not on file    Minutes per session: Not on file  . Stress: Not on file  Relationships  . Social connections:    Talks on phone: Not on file    Gets together: Not on file    Attends religious service: Not on file    Active member of club or organization: Not on file    Attends meetings of clubs or organizations: Not on file    Relationship status: Not on file  Other Topics Concern  . 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    Outpatient Encounter Medications as of 11/06/2017  Medication Sig  . ALPRAZolam (XANAX) 0.25 MG tablet Take 0.25 mg by mouth at bedtime as needed for sleep.  Marland Kitchen atorvastatin (LIPITOR) 40 MG tablet TAKE 1 TABLET (40 MG TOTAL) BY MOUTH AT BEDTIME.  . carvedilol (COREG) 12.5 MG tablet TAKE 1 TABLET (12.5 MG TOTAL) BY MOUTH 2 (TWO) TIMES DAILY.  Marland Kitchen clopidogrel (PLAVIX) 75 MG tablet Take 1 tablet (75 mg total) by mouth daily.  . Coenzyme Q10 (CO Q 10 PO) Take 1 tablet by mouth daily.  . isosorbide mononitrate (IMDUR) 60 MG 24 hr tablet TAKE 1 TABLET (60 MG TOTAL) BY MOUTH DAILY.  Marland Kitchen lisinopril (PRINIVIL,ZESTRIL) 5 MG tablet TAKE 1 TABLET BY MOUTH EVERY DAY  . Magnesium Oxide 200 MG TABS Take 200 mg by mouth daily.  . nitroGLYCERIN (NITROSTAT) 0.4 MG SL tablet PLACE 1 TABLET UNDER THE TONGUE EVERY 5 MINUTES AS NEEDED FOR CHEST PAIN. MAX OF 3 DOSES  . omeprazole  (PRILOSEC) 20 MG capsule Take 1 capsule (20 mg total) by mouth daily.  . silver sulfADIAZINE (SILVADENE) 1 % cream Apply 1 application topically daily.  Marland Kitchen venlafaxine XR (EFFEXOR-XR) 75 MG 24 hr capsule Take 1 capsule (75 mg total) by mouth daily with breakfast.   No facility-administered encounter medications on file as of 11/06/2017.     Activities of  Daily Living In your present state of health, do you have any difficulty performing the following activities: 11/06/2017  Hearing? Y  Vision? N  Difficulty concentrating or making decisions? N  Walking or climbing stairs? N  Dressing or bathing? N  Doing errands, shopping? N  Preparing Food and eating ? N  Using the Toilet? N  In the past six months, have you accidently leaked urine? Y  Do you have problems with loss of bowel control? N  Managing your Medications? N  Managing your Finances? N  Housekeeping or managing your Housekeeping? N  Some recent data might be hidden    Patient Care Team: Jinny Sanders, MD as PCP - General (Family Medicine)   Assessment:   This is a routine wellness examination for Yanis.  Hearing Screening Comments: Bilateral hearing aids Vision Screening Comments: Vision exam in June 2019 @ [facility]   Exercise Activities and Dietary recommendations Current Exercise Habits: The patient does not participate in regular exercise at present, Exercise limited by: None identified  Goals    . Increase water intake     Starting 11/06/2017, I will attempt to drink at least 6-8 glasses of water daily.        Fall Risk Fall Risk  11/06/2017 07/08/2016 06/01/2015  Falls in the past year? No No No   Depression Screen PHQ 2/9 Scores 11/06/2017 07/08/2016 06/01/2015  PHQ - 2 Score 0 0 0  PHQ- 9 Score 0 - -    Cognitive Function MMSE - Mini Mental State Exam 11/06/2017 07/08/2016  Orientation to time 5 5  Orientation to Place 5 5  Registration 3 3  Attention/ Calculation 0 0  Recall 3 3  Language- name  2 objects 0 0  Language- repeat 1 1  Language- follow 3 step command 3 3  Language- read & follow direction 0 0  Write a sentence 0 0  Copy design 0 0  Total score 20 20     PLEASE NOTE: A Mini-Cog screen was completed. Maximum score is 20. A value of 0 denotes this part of Folstein MMSE was not completed or the patient failed this part of the Mini-Cog screening.   Mini-Cog Screening Orientation to Time - Max 5 pts Orientation to Place - Max 5 pts Registration - Max 3 pts Recall - Max 3 pts Language Repeat - Max 1 pts Language Follow 3 Step Command - Max 3 pts     Immunization History  Administered Date(s) Administered  . Influenza, High Dose Seasonal PF 10/08/2016, 09/16/2017  . Influenza-Unspecified 11/13/2012, 09/14/2015  . Pneumococcal Conjugate-13 06/01/2015  . Pneumococcal Polysaccharide-23 07/08/2016    Screening Tests Health Maintenance  Topic Date Due  . FOOT EXAM  11/10/2017 (Originally 07/11/2017)  . COLONOSCOPY  01/13/2019 (Originally 02/11/1996)  . TETANUS/TDAP  01/13/2019 (Originally 02/10/1965)  . HEMOGLOBIN A1C  05/08/2018  . OPHTHALMOLOGY EXAM  06/14/2018  . INFLUENZA VACCINE  Completed  . Hepatitis C Screening  Completed  . PNA vac Low Risk Adult  Completed       Plan:     I have personally reviewed, addressed, and noted the following in the patient's chart:  A. Medical and social history B. Use of alcohol, tobacco or illicit drugs  C. Current medications and supplements D. Functional ability and status E.  Nutritional status F.  Physical activity G. Advance directives H. List of other physicians I.  Hospitalizations, surgeries, and ER visits in previous 12 months J.  Vitals K. Screenings to  include hearing, vision, cognitive, depression L. Referrals and appointments - none  In addition, I have reviewed and discussed with patient certain preventive protocols, quality metrics, and best practice recommendations. A written personalized care plan  for preventive services as well as general preventive health recommendations were provided to patient.  See attached scanned questionnaire for additional information.   Signed,   Lindell Noe, MHA, BS, LPN Health Coach

## 2017-11-06 NOTE — Telephone Encounter (Signed)
-----   Message from Eustace Pen, LPN sent at [id number]  4:31 PM EDT ----- Regarding: Labs 10/25 Lab orders needed. Thank you.  Insurance:  [employer]  If none, please advise. Thank you.

## 2017-11-06 NOTE — Progress Notes (Signed)
PCP notes:   Health maintenance:  Foot exam - PCP follow-up needed Colon cancer screening - pt declined Tetanus vaccine - postponed/insurance A1C - completed Eye exam - per pt, exam in June 2019  Abnormal screenings:   None  Patient concerns:   None  Nurse concerns:  None  Next PCP appt:   11/10/17 @ 1030

## 2017-11-10 ENCOUNTER — Ambulatory Visit (INDEPENDENT_AMBULATORY_CARE_PROVIDER_SITE_OTHER): Payer: Medicare HMO | Admitting: Family Medicine

## 2017-11-10 ENCOUNTER — Telehealth: Payer: Self-pay | Admitting: Family Medicine

## 2017-11-10 ENCOUNTER — Encounter: Payer: Self-pay | Admitting: Family Medicine

## 2017-11-10 VITALS — BP 100/70 | HR 109 | Temp 98.5°F | Ht 66.5 in | Wt 190.2 lb

## 2017-11-10 DIAGNOSIS — Z9861 Coronary angioplasty status: Secondary | ICD-10-CM

## 2017-11-10 DIAGNOSIS — I2510 Atherosclerotic heart disease of native coronary artery without angina pectoris: Secondary | ICD-10-CM

## 2017-11-10 DIAGNOSIS — I10 Essential (primary) hypertension: Secondary | ICD-10-CM

## 2017-11-10 DIAGNOSIS — E785 Hyperlipidemia, unspecified: Secondary | ICD-10-CM

## 2017-11-10 DIAGNOSIS — F331 Major depressive disorder, recurrent, moderate: Secondary | ICD-10-CM

## 2017-11-10 DIAGNOSIS — E119 Type 2 diabetes mellitus without complications: Secondary | ICD-10-CM

## 2017-11-10 DIAGNOSIS — R69 Illness, unspecified: Secondary | ICD-10-CM | POA: Diagnosis not present

## 2017-11-10 DIAGNOSIS — G4733 Obstructive sleep apnea (adult) (pediatric): Secondary | ICD-10-CM | POA: Diagnosis not present

## 2017-11-10 DIAGNOSIS — Z0000 Encounter for general adult medical examination without abnormal findings: Secondary | ICD-10-CM

## 2017-11-10 NOTE — Assessment & Plan Note (Signed)
LDL almost at goal on statin. Encouraged exercise, weight loss, healthy eating habits.

## 2017-11-10 NOTE — Telephone Encounter (Signed)
Patient was seen today and told to come back for labs and follow up appointment in 6 months.  Patient would like to have his labs done at the [place] office.  Please put orders in Epic.

## 2017-11-10 NOTE — Patient Instructions (Addendum)
Work on increasing exercie sand low carb low cholesterol diet.  Increase water, try daily miralax for constipation.  Call if interested colon cancer.

## 2017-11-10 NOTE — Addendum Note (Signed)
Addended by: Eliezer Lofts E on: 11/10/2017 04:43 PM   Modules accepted: Orders

## 2017-11-10 NOTE — Assessment & Plan Note (Signed)
Refuses referral for repeat testing or consideration of oral appliance or CPAP.

## 2017-11-10 NOTE — Assessment & Plan Note (Signed)
Diet controlled.  

## 2017-11-10 NOTE — Assessment & Plan Note (Signed)
Well controlled. Continue current medication.  

## 2017-11-10 NOTE — Progress Notes (Signed)
Subjective:    Patient ID: Roy [name], male    DOB: Jul 21, 1945, 72 y.o.   MRN: [id number]  HPI  The patient presents for complete physical and review of chronic health problems. He/She also has the following acute concerns today:  The patient saw Candis Musa, LPN for medicare wellness. Note reviewed in detail and important notes copied below. Foot exam - PCP follow-up needed Colon cancer screening - pt declined Tetanus vaccine - postponed/insurance A1C - completed Eye exam - per pt, exam in June 2019  Abnormal screenings:   None  Patient concerns:   None  Nurse concerns:  None  11/10/17  Diabetes:  Diet controlled. Lab Results  Component Value Date   HGBA1C 6.2 11/06/2017  Using medications without difficulties: Hypoglycemic episodes: Hyperglycemic episodes: Feet problems: Blood Sugars averaging: eye exam within last year:  MDD, well controlled on venlafaxine 75 mg daily  Just started a new part-time job.. 30 hours a week.  Hypertension:   Good control on lisinopril  Using medication without problems or lightheadedness: none Chest pain with exertion:none Edema:none Short of breath:none Average home BPs: Other issues: CAD on plavix s/p stent, coreg, imdur,   Followed by cards.  OSA untreated.. Never tried any treatment. Refuses treatment.  Elevated Cholesterol: LDL almost at goal < 70 on lipitor 40 mg daily Lab Results  Component Value Date   CHOL 166 11/06/2017   HDL 52.30 11/06/2017   LDLCALC 65 03/26/2016   LDLDIRECT 89.0 11/06/2017   TRIG 241.0 (H) 11/06/2017   CHOLHDL 3 11/06/2017  Using medications without problems: Muscle aches:  Diet compliance: moderate Exercise:none Other complaints:  Marland Kitchen   Mild constipation.. Firmer stools, some straining. Has tried metamucil.  Has taken twice daily stool softner.. Minimal benefit.    No recent bright red blood in stool  Social History /Family History/Past Medical History reviewed  in detail and updated in EMR if needed. Blood pressure 100/70, pulse (!) 109, temperature 98.5 F (36.9 C), temperature source Oral, height 5' 6.5" (1.689 m), weight 190 lb 4 oz (86.3 kg).   Review of Systems  Constitutional: Negative for fatigue and fever.  HENT: Negative for ear pain.   Eyes: Negative for pain.  Respiratory: Negative for cough and shortness of breath.   Cardiovascular: Negative for chest pain, palpitations and leg swelling.  Gastrointestinal: Positive for constipation. Negative for abdominal pain.  Genitourinary: Negative for dysuria.  Musculoskeletal: Negative for arthralgias.  Neurological: Negative for syncope, light-headedness and headaches.  Psychiatric/Behavioral: Negative for dysphoric mood.       Objective:   Physical Exam  Constitutional: He appears well-developed and well-nourished.  Non-toxic appearance. He does not appear ill. No distress.  HENT:  Head: Normocephalic and atraumatic.  Right Ear: Hearing, tympanic membrane, external ear and ear canal normal.  Left Ear: Hearing, tympanic membrane, external ear and ear canal normal.  Nose: Nose normal.  Mouth/Throat: Uvula is midline, oropharynx is clear and moist and mucous membranes are normal.  Eyes: Pupils are equal, round, and reactive to light. Conjunctivae, EOM and lids are normal. Lids are everted and swept, no foreign bodies found.  Neck: Trachea normal, normal range of motion and phonation normal. Neck supple. Carotid bruit is not present. No thyroid mass and no thyromegaly present.  Cardiovascular: Normal rate, regular rhythm, S1 normal, S2 normal, intact distal pulses and normal pulses. Exam reveals no gallop.  No murmur heard. Pulmonary/Chest: Breath sounds normal. He has no wheezes. He has no rhonchi. He has no  rales.  Abdominal: Soft. Normal appearance and bowel sounds are normal. There is no hepatosplenomegaly. There is no tenderness. There is no rebound, no guarding and no CVA tenderness.  No hernia.  Lymphadenopathy:    He has no cervical adenopathy.  Neurological: He is alert. He has normal strength and normal reflexes. No cranial nerve deficit or sensory deficit. Gait normal.  Skin: Skin is warm, dry and intact. No rash noted.  Psychiatric: He has a normal mood and affect. His speech is normal and behavior is normal. Judgment normal.          Assessment & Plan:  The patient's preventative maintenance and recommended screening tests for an annual wellness exam were reviewed in full today. Brought up to date unless services declined.  Counselled on the importance of diet, exercise, and its role in overall health and mortality. The patient's FH and SH was reviewed, including their home life, tobacco status, and drug and alcohol status.  Vaccines: Up to date for flu, PCV 13/23 and zostavax.. Refused Tdap Colon:  Refused despite recommendations to eval given constipation/past blood in stool, no family history of colon cancer. Nonsmoker Hep C neg PSA previously stable, not indicated after age 6. Father with prostate cancer.  Alcohol 2 cocktails a day.

## 2017-11-11 NOTE — Progress Notes (Signed)
I reviewed health advisor's note, was available for consultation, and agree with documentation and plan.  

## 2017-12-16 ENCOUNTER — Other Ambulatory Visit: Payer: Self-pay | Admitting: Family Medicine

## 2018-01-28 DIAGNOSIS — R69 Illness, unspecified: Secondary | ICD-10-CM | POA: Diagnosis not present

## 2018-03-14 ENCOUNTER — Other Ambulatory Visit: Payer: Self-pay | Admitting: Cardiovascular Disease

## 2018-03-14 DIAGNOSIS — I25119 Atherosclerotic heart disease of native coronary artery with unspecified angina pectoris: Secondary | ICD-10-CM

## 2018-03-18 NOTE — Progress Notes (Deleted)
Patient ID: Roy [name], male   DOB: [birth year], 73 y.o.   MRN: [id number]    Cardiology Office Note   Date:  03/18/2018   ID:  Roy [name], Roy [name] [birth year], MRN [id number]  PCP:  Jinny Sanders, MD  Cardiologist:  Dr. Jenkins Rouge     No chief complaint on file.    History of Present Illness:.    73 y.o. distant stent to RCA in 2001. Had STEMI during stres test 2016 with DES to Diagonal. Had residual PLB disease Rx medically  Ranexa not much benefit. Last myovue 03/26/16 non ischemic EF 58% old IMI  Cramps with statin improved with CoQ and lower dose   Has significant depression despite being on Effexor. Back from annual trip to [place] and did not enjoy it this time. Has ex wife and one son in [place] one in [place] but no daily contact.   ***  Past Medical History:  Diagnosis Date  . Basal cell carcinoma    Bilateral Legs  . CAD (coronary artery disease)    a.  stent to the RCA in 2001;  b.  Negative Myoview in January 2012;  c.  LHC 4/16:  dLM 20, small D1 50-70, D2 sub-total, oLCx 50-70, RCA stent ok, dPLB 90, EF 35-40% >> DES to D2  . Depression   . History of echocardiogram    a.  Echo 4/16: Mild LVH, EF 83-38%, grade 1 diastolic dysfunction, normal wall motion, MAC  . HLD (hyperlipidemia)   . HTN (hypertension)   . OSA (obstructive sleep apnea)   . Vertigo     Past Surgical History:  Procedure Laterality Date  . CATARACT EXTRACTION, BILATERAL    . CORONARY STENT PLACEMENT  2001    RCA  . LEFT HEART CATHETERIZATION WITH CORONARY ANGIOGRAM N/A 02/13/2012   Procedure: LEFT HEART CATHETERIZATION WITH CORONARY ANGIOGRAM;  Surgeon: Venetia Prewitt M Martinique, MD;  Location: [facility] LAB;  Service: Cardiovascular;  Laterality: N/A;  . LEFT HEART CATHETERIZATION WITH CORONARY ANGIOGRAM N/A 04/24/2014   Procedure: LEFT HEART CATHETERIZATION WITH CORONARY ANGIOGRAM;  Surgeon: [name], MD;  [facility];  Service: Cardiovascular;  Laterality: N/A;  .  TONSILLECTOMY       Current Outpatient Medications  Medication Sig Dispense Refill  . ALPRAZolam (XANAX) 0.25 MG tablet Take 0.25 mg by mouth at bedtime as needed for sleep.    Marland Kitchen atorvastatin (LIPITOR) 40 MG tablet TAKE 1 TABLET (40 MG TOTAL) BY MOUTH AT BEDTIME. 90 tablet 3  . carvedilol (COREG) 12.5 MG tablet TAKE 1 TABLET (12.5 MG TOTAL) BY MOUTH 2 (TWO) TIMES DAILY. 180 tablet 3  . clopidogrel (PLAVIX) 75 MG tablet Take 1 tablet (75 mg total) by mouth daily. 90 tablet 3  . Coenzyme Q10 (CO Q 10 PO) Take 1 tablet by mouth daily.    . isosorbide mononitrate (IMDUR) 60 MG 24 hr tablet Take 1 tablet (60 mg total) by mouth daily. Pt needs to keep upcoming appt for further refills - Thank you 90 tablet 0  . lisinopril (PRINIVIL,ZESTRIL) 5 MG tablet Take 1 tablet (5 mg total) by mouth daily. Patient needs to keep upcoming appt for future refills. - Thank you 90 tablet 0  . Magnesium Oxide 200 MG TABS Take 200 mg by mouth daily.    . nitroGLYCERIN (NITROSTAT) 0.4 MG SL tablet PLACE 1 TABLET UNDER THE TONGUE EVERY 5 MINUTES AS NEEDED FOR CHEST PAIN. MAX OF 3 DOSES 25 tablet 0  .  omeprazole (PRILOSEC) 20 MG capsule Take 1 capsule (20 mg total) by mouth daily. 90 capsule 2  . silver sulfADIAZINE (SILVADENE) 1 % cream Apply 1 application topically daily. 50 g 0  . venlafaxine XR (EFFEXOR-XR) 75 MG 24 hr capsule TAKE 1 CAPSULE (75 MG TOTAL) BY MOUTH DAILY WITH BREAKFAST. 90 capsule 1   No current facility-administered medications for this visit.     Allergies:   Patient has no known allergies.    Social History:  The patient  reports that he quit smoking about 18 years ago. His smoking use included cigarettes. He has a 25.00 pack-year smoking history. He has never used smokeless tobacco. He reports current alcohol use of about 3.0 standard drinks of alcohol per week. He reports that he does not use drugs.   Family History:  The patient's family history includes Alcohol abuse in his mother;  Cirrhosis in his mother; Depression in his sister; Heart disease in his father; Hypertension in his father; Peripheral vascular disease in his father.    ROS:   Please see the history of present illness.   Review of Systems  All other systems reviewed and are negative.    PHYSICAL EXAM: VS:  There were no vitals taken for this visit.    Wt Readings from Last 3 Encounters:  11/10/17 86.3 kg  11/06/17 86 kg  06/09/17 80.7 kg    Affect Depressed  Healthy:  appears stated age 25: normal Neck supple with no adenopathy JVP normal no bruits no thyromegaly Lungs clear with no wheezing and good diaphragmatic motion Heart:  S1/S2 no murmur, no rub, gallop or click PMI normal Abdomen: benighn, BS positve, no tenderness, no AAA no bruit.  No HSM or HJR Distal pulses intact with no bruits No edema Neuro non-focal Skin warm and dry No muscular weakness    EKG:  07/26/14    NSR, HR 70, no change from last tracing.   04/02/15  ST rate 111 LAFB  Poor R wave progression   03/19/16  SR rate 61 PAC otherwise normal 03/30/17  SR rate 71 normal   Recent Labs: 03/31/2017: Hemoglobin 13.1; Platelets 208.0; TSH 1.83 11/06/2017: ALT 32; BUN 14; Creatinine, Ser 1.22; Potassium 4.6; Sodium 140    Lipid Panel    Component Value Date/Time   CHOL 166 11/06/2017 1048   CHOL 174 03/26/2016 0736   TRIG 241.0 (H) 11/06/2017 1048   TRIG 130 12/08/2005 0741   HDL 52.30 11/06/2017 1048   HDL 40 03/26/2016 0736   CHOLHDL 3 11/06/2017 1048   VLDL 48.2 (H) 11/06/2017 1048   LDLCALC 65 03/26/2016 0736   LDLDIRECT 89.0 11/06/2017 1048      ASSESSMENT AND PLAN:  1.  CAD: Distant stent to RCA.    DES to D2 04/2014 Non ischemic myovue 03/26/16 EF 58%   . Continue aspirin, Lipitor, Coreg, Plavix, Imdur, lisinopril.   2.  Essential hypertension:  Well controlled.  Continue current medications and low sodium Dash type diet.     3.  Dyslipidemia:   On statin labs with primary   4. Tachycardia:  Improved  on beta blocker  TSH noraml 07/08/16   5. Depression:  Seems much worse f/u primary consider counseling and alternative SSRI     Disposition:   FU with me  In a year    Jenkins Rouge

## 2018-03-29 ENCOUNTER — Telehealth: Payer: Self-pay | Admitting: Cardiovascular Disease

## 2018-03-29 NOTE — Telephone Encounter (Signed)
New message   Patient states that he is returning a call in reference to returning from being out of the country.

## 2018-03-29 NOTE — Telephone Encounter (Signed)
Called patient back. Moved patient's appointment out a few months, since this is a routine visit and patient has no issues at this time. Encouraged patient to give our office a call if he does. Patient agreed to plan and will give our office a call if he anything comes up between now and his next appointment.

## 2018-03-30 ENCOUNTER — Ambulatory Visit: Payer: Medicare HMO | Admitting: Cardiovascular Disease

## 2018-04-05 ENCOUNTER — Other Ambulatory Visit: Payer: Self-pay | Admitting: Cardiovascular Disease

## 2018-04-11 ENCOUNTER — Other Ambulatory Visit: Payer: Self-pay | Admitting: Cardiovascular Disease

## 2018-05-13 ENCOUNTER — Ambulatory Visit: Payer: Medicare HMO | Admitting: Family Medicine

## 2018-06-03 ENCOUNTER — Telehealth: Payer: Self-pay

## 2018-06-03 NOTE — Telephone Encounter (Signed)
    COVID-19 Pre-Screening Questions:  . In the past 7 to 10 days have you had a cough,  shortness of breath, headache, congestion, fever (100 or greater) body aches, chills, sore throat, or sudden loss of taste or sense of smell? NO . Have you been around anyone with known Covid 19. NO . Have you been around anyone who is awaiting Covid 19 test results in the past 7 to 10 days? No . Have you been around anyone who has been exposed to Covid 19, or has mentioned symptoms of Covid 19 within the past 7 to 10 days? NO  If you have any concerns/questions about symptoms patients report during screening (either on the phone or at threshold). Contact the provider seeing the patient or DOD for further guidance.  If neither are available contact a member of the leadership team.

## 2018-06-16 ENCOUNTER — Ambulatory Visit: Payer: Medicare HMO | Admitting: Cardiovascular Disease

## 2018-06-17 ENCOUNTER — Other Ambulatory Visit: Payer: Self-pay | Admitting: Cardiovascular Disease

## 2018-06-17 DIAGNOSIS — I25119 Atherosclerotic heart disease of native coronary artery with unspecified angina pectoris: Secondary | ICD-10-CM

## 2018-06-20 ENCOUNTER — Other Ambulatory Visit: Payer: Self-pay | Admitting: Cardiovascular Disease

## 2018-06-21 ENCOUNTER — Other Ambulatory Visit: Payer: Self-pay | Admitting: Family Medicine

## 2018-06-25 ENCOUNTER — Telehealth: Payer: Self-pay

## 2018-06-25 NOTE — Telephone Encounter (Signed)
Left message letting pt know appointment has been canceled

## 2018-06-25 NOTE — Telephone Encounter (Signed)
[place] Night - Client Nonclinical Telephone Record AccessNurse Client [place] Night - Client Client Site [place] - Night Physician Eliezer Lofts - MD Contact Type Call Who Is Calling Patient / Member / Family / Caregiver Caller Name Pantelis Elgersma Caller Phone Number [phone] Patient Name Roy [name] Patient DOB May 01, 1945 Call Type Message Only Information Provided Reason for Call Request to [place] Appointment Initial Comment Caller states, he needs to cancel a appointment. Additional Comment Tuesday appointment, denied triage and stated he will call the office back. Call Closed By: Pierce Date/

## 2018-06-28 ENCOUNTER — Other Ambulatory Visit: Payer: Self-pay | Admitting: Cardiovascular Disease

## 2018-06-29 ENCOUNTER — Ambulatory Visit: Payer: Medicare HMO | Admitting: Family Medicine

## 2018-07-13 DIAGNOSIS — H903 Sensorineural hearing loss, bilateral: Secondary | ICD-10-CM | POA: Diagnosis not present

## 2018-07-18 ENCOUNTER — Other Ambulatory Visit: Payer: Self-pay | Admitting: Cardiovascular Disease

## 2018-07-29 DIAGNOSIS — R69 Illness, unspecified: Secondary | ICD-10-CM | POA: Diagnosis not present

## 2018-08-03 ENCOUNTER — Other Ambulatory Visit: Payer: Self-pay | Admitting: Cardiovascular Disease

## 2018-08-09 DIAGNOSIS — R69 Illness, unspecified: Secondary | ICD-10-CM | POA: Diagnosis not present

## 2018-08-25 NOTE — Progress Notes (Signed)
Virtual Visit via Telephone Note   This visit type was conducted due to national recommendations for restrictions regarding the COVID-19 Pandemic (e.g. social distancing) in an effort to limit this patient's exposure and mitigate transmission in our community.  Due to his co-morbid illnesses, this patient is at least at moderate risk for complications without adequate follow up.  This format is felt to be most appropriate for this patient at this time.  The patient did not have access to video technology/had technical difficulties with video requiring transitioning to audio format only (telephone).  All issues noted in this document were discussed and addressed.  No physical exam could be performed with this format.  Please refer to the patient's chart for his  consent to telehealth for [facility].   Date:  08/30/2018   ID:  Roy [name], DOB Jul 25, 1945, MRN [id number]  Patient Location: Home Provider Location: Office  PCP:  Jinny Sanders, MD  Cardiologist:   Johnsie Cancel Electrophysiologist:  None   Evaluation Performed:  Follow-Up Visit  Chief Complaint:  CAD  History of Present Illness:    73 y.o. distant stent to RCA in 2001. Had STEMI during stres test 2016 with DES to Diagonal. Had residual PLB disease Rx medically  Ranexa not much benefit. Last myovue 03/26/16 non ischemic EF 58% Cramps with statin improved with CoQ and lower dose   Has significant depression despite being on Effexor. 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    Past Medical History:  Diagnosis Date  . Basal cell carcinoma    Bilateral Legs  . CAD (coronary artery disease)    a.  stent to the RCA in 2001;  b.  Negative Myoview in January 2012;  c.  LHC 4/16:   dLM 20, small D1 50-70, D2 sub-total, oLCx 50-70, RCA stent ok, dPLB 90, EF 35-40% >> DES to D2  . Depression   . History of echocardiogram    a.  Echo 4/16: Mild LVH, EF 03-54%, grade 1 diastolic dysfunction, normal wall motion, MAC  . HLD (hyperlipidemia)   . HTN (hypertension)   . OSA (obstructive sleep apnea)   . Vertigo    Past Surgical History:  Procedure Laterality Date  . CATARACT EXTRACTION, BILATERAL    . CORONARY STENT PLACEMENT  2001    RCA  . LEFT HEART CATHETERIZATION WITH CORONARY ANGIOGRAM N/A 02/13/2012   Procedure: LEFT HEART CATHETERIZATION WITH CORONARY ANGIOGRAM;  Surgeon: Ziggy Chanthavong M Martinique, MD;  Location: [facility] CATH LAB;  Service: Cardiovascular;  Laterality: N/A;  . LEFT HEART CATHETERIZATION WITH CORONARY ANGIOGRAM N/A 04/24/2014   Procedure: LEFT HEART CATHETERIZATION WITH CORONARY ANGIOGRAM;  Surgeon: [name], MD;  Location: [facility] CATH LAB;  Service: Cardiovascular;  Laterality: N/A;  . TONSILLECTOMY       Current Meds  Medication Sig  . ALPRAZolam (XANAX) 0.25 MG tablet Take 0.25 mg by mouth at bedtime as needed for sleep.  Marland Kitchen atorvastatin (LIPITOR) 40 MG tablet Take 1 tablet (40 mg total) by mouth at bedtime. Please keep upcoming appt with Dr. Johnsie Cancel in August for future refills. Thank you  . carvedilol (COREG) 12.5 MG tablet TAKE 1 TABLET (12.5 MG TOTAL) BY MOUTH 2 (TWO) TIMES  DAILY. Pleas keep appt for refills. Thank you  . clopidogrel (PLAVIX) 75 MG tablet TAKE 1 TABLET BY MOUTH EVERY DAY  . Coenzyme Q10 (CO Q 10 PO) Take 1 tablet by mouth daily.  . isosorbide mononitrate (IMDUR) 60 MG 24 hr tablet Take 1 tablet (60 mg total) by mouth daily. Please keep upcoming appt with Dr. Johnsie Cancel in August before anymore refills. Final Attempt  . lisinopril (ZESTRIL) 5 MG tablet Take 1 tablet (5 mg total) by mouth daily. Please keep upcoming appt in August with Dr. Johnsie Cancel before anymore refills. Final Attempt  . Magnesium Oxide 200 MG TABS Take 200 mg by mouth daily.  .  nitroGLYCERIN (NITROSTAT) 0.4 MG SL tablet PLACE 1 TABLET UNDER THE TONGUE EVERY 5 MINUTES AS NEEDED FOR CHEST PAIN. MAX OF 3 DOSES  . omeprazole (PRILOSEC) 20 MG capsule Take 1 capsule (20 mg total) by mouth daily. Please keep upcoming appt for future refills. Thank you.  . silver sulfADIAZINE (SILVADENE) 1 % cream Apply 1 application topically daily.  Marland Kitchen venlafaxine XR (EFFEXOR-XR) 75 MG 24 hr capsule TAKE 1 CAPSULE (75 MG TOTAL) BY MOUTH DAILY WITH BREAKFAST.     Allergies:   Patient has no known allergies.   Social History   Tobacco Use  . Smoking status: Former Smoker    Packs/day: 1.00    Years: 25.00    Pack years: 25.00    Types: Cigarettes    Quit date: 01/14/2000    Years since quitting: 18.6  . Smokeless tobacco: Never Used  Substance Use Topics  . Alcohol use: Yes    Alcohol/week: 3.0 standard drinks    Types: 2 Shots of liquor, 1 Cans of beer per week  . Drug use: No     Family Hx: The patient's family history includes Alcohol abuse in his mother; Cirrhosis in his mother; Depression in his sister; Heart disease in his father; Hypertension in his father; Peripheral vascular disease in his father. There is no history of Heart attack or Stroke.  ROS:   Please see the history of present illness.     All other systems reviewed and are negative.   Prior CV studies:   The following studies were reviewed today:  Myovue 03/26/16   Labs/Other Tests and Data Reviewed:    EKG:  SR rate 71 normal   Recent Labs: 11/06/2017: ALT 32; BUN 14; Creatinine, Ser 1.22; Potassium 4.6; Sodium 140   Recent Lipid Panel Lab Results  Component Value Date/Time   CHOL 166 11/06/2017 10:48 AM   CHOL 174 03/26/2016 07:36 AM   TRIG 241.0 (H) 11/06/2017 10:48 AM   TRIG 130 12/08/2005 07:41 AM   HDL 52.30 11/06/2017 10:48 AM   HDL 40 03/26/2016 07:36 AM   CHOLHDL 3 11/06/2017 10:48 AM   LDLCALC 65 03/26/2016 07:36 AM   LDLDIRECT 89.0 11/06/2017 10:48 AM    Wt Readings from Last 3  Encounters:  08/30/18 180 lb (81.6 kg)  11/10/17 190 lb 4 oz (86.3 kg)  11/06/17 189 lb 8 oz (86 kg)     Objective:    Vital Signs:  BP (!) 141/96   Pulse 70   Ht _0  (1.676 m)   Wt 180 lb (81.6 kg)   BMI 29.05 kg/m    Telephone visit no exam   ASSESSMENT & PLAN:    CAD:  Distant stent to RCA 2001 DES to diagonal 2016 with residual small PLB Dx Rx medically Ranexa no beneift non ischemic myovue  March 2018 Normal ECG continue medical Rx  HLD LDL 89 on low dose statin does not tolerate higher doses  HTN:  Well controlled.  Continue current medications and low sodium Dash type diet.    Depression:  On Effexor f/u primary     COVID-19 Education: The signs and symptoms of COVID-19 were discussed with the patient and how to seek care for testing (follow up with PCP or arrange E-visit).  The importance of social distancing was discussed today.  Time:   Today, I have spent 30 minutes with the patient with telehealth technology discussing the above problems.     Medication Adjustments/Labs and Tests Ordered: Current medicines are reviewed at length with the patient today.  Concerns regarding medicines are outlined above.   Tests Ordered:  None   Medication Changes:  None   Disposition:  Follow up in a year  Signed, Jenkins Rouge, MD  08/30/2018 8:42 AM    [place]

## 2018-08-27 ENCOUNTER — Telehealth: Payer: Self-pay

## 2018-08-27 NOTE — Telephone Encounter (Signed)
No answer when called to review medications and discuss appointment details.

## 2018-08-30 ENCOUNTER — Other Ambulatory Visit: Payer: Self-pay

## 2018-08-30 ENCOUNTER — Telehealth (INDEPENDENT_AMBULATORY_CARE_PROVIDER_SITE_OTHER): Payer: Medicare HMO | Admitting: Cardiovascular Disease

## 2018-08-30 ENCOUNTER — Encounter: Payer: Self-pay | Admitting: Cardiovascular Disease

## 2018-08-30 VITALS — BP 141/96 | HR 70 | Ht 66.0 in | Wt 180.0 lb

## 2018-08-30 DIAGNOSIS — I2510 Atherosclerotic heart disease of native coronary artery without angina pectoris: Secondary | ICD-10-CM | POA: Diagnosis not present

## 2018-08-30 NOTE — Patient Instructions (Addendum)
Your physician recommends that you continue on your current medications as directed. Please refer to the Current Medication list given to you today.  Your physician wants you to follow-up in: YEAR WITH DR NISHAN You will receive a reminder letter in the mail two months in advance. If you don't receive a letter, please call our office to schedule the follow-up appointment.  

## 2018-09-12 ENCOUNTER — Other Ambulatory Visit: Payer: Self-pay | Admitting: Cardiovascular Disease

## 2018-09-12 DIAGNOSIS — I25119 Atherosclerotic heart disease of native coronary artery with unspecified angina pectoris: Secondary | ICD-10-CM

## 2018-09-13 DIAGNOSIS — R69 Illness, unspecified: Secondary | ICD-10-CM | POA: Diagnosis not present

## 2018-09-25 ENCOUNTER — Other Ambulatory Visit: Payer: Self-pay | Admitting: Cardiovascular Disease

## 2018-09-28 DIAGNOSIS — H524 Presbyopia: Secondary | ICD-10-CM | POA: Diagnosis not present

## 2018-09-28 DIAGNOSIS — Z961 Presence of intraocular lens: Secondary | ICD-10-CM | POA: Diagnosis not present

## 2018-09-28 LAB — HM DIABETES EYE EXAM

## 2018-10-12 ENCOUNTER — Other Ambulatory Visit: Payer: Self-pay | Admitting: Cardiovascular Disease

## 2018-10-31 ENCOUNTER — Other Ambulatory Visit: Payer: Self-pay | Admitting: Cardiovascular Disease

## 2018-11-04 ENCOUNTER — Telehealth: Payer: Self-pay | Admitting: Family Medicine

## 2018-11-04 DIAGNOSIS — E119 Type 2 diabetes mellitus without complications: Secondary | ICD-10-CM

## 2018-11-04 NOTE — Telephone Encounter (Signed)
-----   Message from Ellamae Sia sent at 11/01/2018 11:31 AM EDT ----- Regarding: Lab orders for Monday, 10.26.20 Patient is scheduled for CPX labs, please order future labs, Thanks , Karna Christmas

## 2018-11-08 ENCOUNTER — Other Ambulatory Visit (INDEPENDENT_AMBULATORY_CARE_PROVIDER_SITE_OTHER): Payer: Medicare HMO

## 2018-11-08 DIAGNOSIS — E119 Type 2 diabetes mellitus without complications: Secondary | ICD-10-CM

## 2018-11-08 LAB — COMPREHENSIVE METABOLIC PANEL
ALT: 30 U/L (ref 0–53)
AST: 21 U/L (ref 0–37)
Albumin: 4 g/dL (ref 3.5–5.2)
Alkaline Phosphatase: 67 U/L (ref 39–117)
BUN: 19 mg/dL (ref 6–23)
CO2: 28 mEq/L (ref 19–32)
Calcium: 9 mg/dL (ref 8.4–10.5)
Chloride: 105 mEq/L (ref 96–112)
Creatinine, Ser: 1.18 mg/dL (ref 0.40–1.50)
GFR: 60.55 mL/min (ref >60.00)
Glucose, Bld: 115 mg/dL — ABNORMAL HIGH (ref 70–99)
Potassium: 5.5 mEq/L — ABNORMAL HIGH (ref 3.5–5.1)
Sodium: 138 mEq/L (ref 135–145)
Total Bilirubin: 0.5 mg/dL (ref 0.2–1.2)
Total Protein: 6.5 g/dL (ref 6.0–8.3)

## 2018-11-08 LAB — LDL CHOLESTEROL, DIRECT: Direct LDL: 97 mg/dL

## 2018-11-08 LAB — HEMOGLOBIN A1C: Hgb A1c MFr Bld: 6.1 % (ref 4.6–6.5)

## 2018-11-08 LAB — LIPID PANEL
Cholesterol: 198 mg/dL (ref 0–200)
HDL: 57.3 mg/dL (ref >39.00)
NonHDL: 141.13
Total CHOL/HDL Ratio: 3
Triglycerides: 295 mg/dL — ABNORMAL HIGH (ref 0.0–149.0)
VLDL: 59 mg/dL — ABNORMAL HIGH (ref 0.0–40.0)

## 2018-11-09 NOTE — Progress Notes (Signed)
No critical labs need to be addressed urgently. We will discuss labs in detail at upcoming office visit.   

## 2018-11-16 ENCOUNTER — Other Ambulatory Visit: Payer: Self-pay

## 2018-11-16 ENCOUNTER — Encounter: Payer: Self-pay | Admitting: Family Medicine

## 2018-11-16 ENCOUNTER — Ambulatory Visit: Payer: Medicare HMO

## 2018-11-16 ENCOUNTER — Ambulatory Visit (INDEPENDENT_AMBULATORY_CARE_PROVIDER_SITE_OTHER): Payer: Medicare HMO | Admitting: Family Medicine

## 2018-11-16 VITALS — BP 122/84 | HR 88 | Temp 97.0°F | Ht 66.0 in | Wt 178.5 lb

## 2018-11-16 DIAGNOSIS — Z0000 Encounter for general adult medical examination without abnormal findings: Secondary | ICD-10-CM

## 2018-11-16 DIAGNOSIS — F331 Major depressive disorder, recurrent, moderate: Secondary | ICD-10-CM | POA: Diagnosis not present

## 2018-11-16 DIAGNOSIS — Z85828 Personal history of other malignant neoplasm of skin: Secondary | ICD-10-CM | POA: Diagnosis not present

## 2018-11-16 DIAGNOSIS — L989 Disorder of the skin and subcutaneous tissue, unspecified: Secondary | ICD-10-CM

## 2018-11-16 DIAGNOSIS — E875 Hyperkalemia: Secondary | ICD-10-CM

## 2018-11-16 DIAGNOSIS — E119 Type 2 diabetes mellitus without complications: Secondary | ICD-10-CM | POA: Diagnosis not present

## 2018-11-16 DIAGNOSIS — R69 Illness, unspecified: Secondary | ICD-10-CM | POA: Diagnosis not present

## 2018-11-16 DIAGNOSIS — I10 Essential (primary) hypertension: Secondary | ICD-10-CM | POA: Diagnosis not present

## 2018-11-16 LAB — HM DIABETES FOOT EXAM

## 2018-11-16 LAB — POTASSIUM: Potassium: 4.5 mEq/L (ref 3.5–5.1)

## 2018-11-16 NOTE — Assessment & Plan Note (Signed)
well controlled on venlafaxine.

## 2018-11-16 NOTE — Assessment & Plan Note (Signed)
Well controlled. Continue current medication.  

## 2018-11-16 NOTE — Patient Instructions (Addendum)
Set up yearly eye exam if not already done in last year.  We will call to set dermatology referral.  Work on low cholesterol diet.  Stop at lab on way out for potasssium check.

## 2018-11-16 NOTE — Progress Notes (Signed)
Chief Complaint  Patient presents with  . Medicare Wellness    History of Present Illness: HPI  The patient presents for annual medicare wellness, complete physical and review of chronic health problems. He/She also has the following acute concerns today:  I have personally reviewed the Medicare Annual Wellness questionnaire and have noted 1. The patient's medical and social history 2. Their use of alcohol, tobacco or illicit drugs 3. Their current medications and supplements 4. The patient's functional ability including ADL's, fall risks, home safety risks and hearing or visual             impairment. 5. Diet and physical activities 6. Evidence for depression or mood disorders 7.         Updated provider list Cognitive evaluation was performed and recorded on pt medicare questionnaire form. The patients weight, height, BMI and visual acuity have been recorded in the chart  I have made referrals, counseling and provided education to the patient based review of the above and I have provided the pt with a written personalized care plan for preventive services.   Documentation of this information was scanned into the electronic record under the media tab.   Advance directives and end of life planning reviewed in detail with patient and documented in EMR. Patient given handout on advance care directives if needed. HCPOA and living will updated if needed.   Hearing Screening   _0  _1  _2  _3  _4  _5  _6  _7  _8   Right ear:           Left ear:           Comments: Wears bi-lateral hearing aides  Vision Screening Comments: Wears glasses- eye exam 09/28/2018 at [facility]     Office Visit from 11/16/2018 in [place] at [facility]  PHQ-2 Total Score  0      Fall Risk  11/16/2018 11/06/2017 07/08/2016 06/01/2015  Falls in the past year? 0 No No No   Diabetes:   Diet controlled. Lab Results  Component Value Date   HGBA1C 6.1 11/08/2018  Using  medications without difficulties: Hypoglycemic episodes: Hyperglycemic episodes: Feet problems: no ulcer Blood Sugars averaging:not checking eye exam within last year: yes  Hx of basal cell on legs.. now has new skin lesions in last 3 months.. no pain, no itching.. requests referral to derm.  Hypertension:   At goal on current regimen  Using medication without problems or lightheadedness: none Chest pain with exertion:none Edema:none Short of breath:none Average home BPs: Other issues:  MDD: well controlled on venlafaxine.  Elevated Cholesterol:  Not at goal LDL< 70 ( CAD hx) on statin lipitor 40 mg daily  Lab Results  Component Value Date   CHOL 198 11/08/2018   HDL 57.30 11/08/2018   LDLCALC 65 03/26/2016   LDLDIRECT 97.0 11/08/2018   TRIG 295.0 (H) 11/08/2018   CHOLHDL 3 11/08/2018  Using medications without problems: Muscle aches:  Diet compliance: good Exercise: walking Other complaints:   Patient Care Team: Jinny Sanders, MD as PCP - General (Family Medicine)   COVID 19 screen No recent travel or known exposure to [place] The patient denies respiratory symptoms of COVID 19 at this time.  The importance of social distancing was discussed today.   Review of Systems  Constitutional: Negative for chills and fever.  HENT: Negative for congestion and ear pain.   Eyes: Negative for pain and redness.  Respiratory: Negative for cough and shortness of breath.   Cardiovascular: Negative for  chest pain, palpitations and leg swelling.  Gastrointestinal: Negative for abdominal pain, blood in stool, constipation, diarrhea, nausea and vomiting.  Genitourinary: Negative for dysuria.  Musculoskeletal: Negative for falls and myalgias.  Skin: Negative for rash.  Neurological: Negative for dizziness.  Psychiatric/Behavioral: Negative for depression. The patient is not nervous/anxious.       Past Medical History:  Diagnosis Date  . Basal cell carcinoma    Bilateral Legs   . CAD (coronary artery disease)    a.  stent to the RCA in 2001;  b.  Negative Myoview in January 2012;  c.  LHC 4/16:  dLM 20, small D1 50-70, D2 sub-total, oLCx 50-70, RCA stent ok, dPLB 90, EF 35-40% >> DES to D2  . Depression   . History of echocardiogram    a.  Echo 4/16: Mild LVH, EF 97-35%, grade 1 diastolic dysfunction, normal wall motion, MAC  . HLD (hyperlipidemia)   . HTN (hypertension)   . OSA (obstructive sleep apnea)   . Vertigo     reports that he quit smoking about 18 years ago. His smoking use included cigarettes. He has a 25.00 pack-year smoking history. He has never used smokeless tobacco. He reports current alcohol use of about 3.0 standard drinks of alcohol per week. He reports that he does not use drugs.   Current Outpatient Medications:  .  ALPRAZolam (XANAX) 0.25 MG tablet, Take 0.25 mg by mouth at bedtime as needed for sleep., Disp: , Rfl:  .  atorvastatin (LIPITOR) 40 MG tablet, TAKE 1 TABLET (40 MG TOTAL) BY MOUTH AT BEDTIME. PLEASE KEEP UPCOMING APPT WITH DR. Johnsie Cancel IN AUGUST FOR FUTURE REFILLS. THANK YOU, Disp: 90 tablet, Rfl: 3 .  carvedilol (COREG) 12.5 MG tablet, TAKE 1 TABLET (12.5 MG TOTAL) BY MOUTH 2 (TWO) TIMES DAILY., Disp: 180 tablet, Rfl: 2 .  clopidogrel (PLAVIX) 75 MG tablet, TAKE 1 TABLET BY MOUTH EVERY DAY, Disp: 90 tablet, Rfl: 3 .  Coenzyme Q10 (CO Q 10 PO), Take 1 tablet by mouth daily., Disp: , Rfl:  .  isosorbide mononitrate (IMDUR) 60 MG 24 hr tablet, Take 1 tablet (60 mg total) by mouth daily., Disp: 90 tablet, Rfl: 3 .  lisinopril (ZESTRIL) 5 MG tablet, Take 1 tablet (5 mg total) by mouth daily., Disp: 90 tablet, Rfl: 3 .  Magnesium Oxide 200 MG TABS, Take 200 mg by mouth daily., Disp: , Rfl:  .  nitroGLYCERIN (NITROSTAT) 0.4 MG SL tablet, PLACE 1 TABLET UNDER THE TONGUE EVERY 5 MINUTES AS NEEDED FOR CHEST PAIN. MAX OF 3 DOSES, Disp: 25 tablet, Rfl: 0 .  omeprazole (PRILOSEC) 20 MG capsule, Take 1 capsule (20 mg total) by mouth daily. Please  keep upcoming appt for future refills. Thank you., Disp: 90 capsule, Rfl: 0 .  venlafaxine XR (EFFEXOR-XR) 75 MG 24 hr capsule, TAKE 1 CAPSULE (75 MG TOTAL) BY MOUTH DAILY WITH BREAKFAST., Disp: 90 capsule, Rfl: 1 .  silver sulfADIAZINE (SILVADENE) 1 % cream, Apply 1 application topically daily., Disp: 50 g, Rfl: 0   Observations/Objective: Blood pressure 122/84, pulse 88, temperature (!) 97 F (36.1 C), temperature source Temporal, height _0  (1.676 m), weight 178 lb 8 oz (81 kg), SpO2 97 %.  Physical Exam Constitutional:      General: He is not in acute distress.    Appearance: Normal appearance. He is well-developed. He is not ill-appearing or toxic-appearing.  HENT:     Head: Normocephalic and atraumatic.     Right Ear: Hearing,  tympanic membrane, ear canal and external ear normal.     Left Ear: Hearing, tympanic membrane, ear canal and external ear normal.     Nose: Nose normal.     Mouth/Throat:     Pharynx: Uvula midline.  Eyes:     General: Lids are normal. Lids are everted, no foreign bodies appreciated.     Conjunctiva/sclera: Conjunctivae normal.     Pupils: Pupils are equal, round, and reactive to light.  Neck:     Musculoskeletal: Normal range of motion and neck supple.     Thyroid: No thyroid mass or thyromegaly.     Vascular: No carotid bruit.     Trachea: Trachea and phonation normal.  Cardiovascular:     Rate and Rhythm: Normal rate and regular rhythm.     Pulses: Normal pulses.     Heart sounds: S1 normal and S2 normal. No murmur. No gallop.   Pulmonary:     Breath sounds: Normal breath sounds. No wheezing, rhonchi or rales.  Abdominal:     General: Bowel sounds are normal.     Palpations: Abdomen is soft.     Tenderness: There is no abdominal tenderness. There is no guarding or rebound.     Hernia: No hernia is present.  Lymphadenopathy:     Cervical: No cervical adenopathy.  Skin:    General: Skin is warm and dry.     Findings: No rash.  Neurological:      Mental Status: He is alert.     Cranial Nerves: No cranial nerve deficit.     Sensory: No sensory deficit.     Gait: Gait normal.     Deep Tendon Reflexes: Reflexes are normal and symmetric.  Psychiatric:        Speech: Speech normal.        Behavior: Behavior normal.        Judgment: Judgment normal.     Diabetic foot exam: Normal inspection No skin breakdown No calluses  Normal DP pulses Normal sensation to light touch and monofilament Nails normal  Assessment and Plan   The patient's preventative maintenance and recommended screening tests for an annual wellness exam were reviewed in full today. Brought up to date unless services declined.  Counselled on the importance of diet, exercise, and its role in overall health and mortality. The patient's FH and SH was reviewed, including their home life, tobacco status, and drug and alcohol status.   Vaccines: Up to date for flu, PCV 13/23 and zostavax.. Refused Tdap Colon:Refused despite recommendations to eval given constipation/past blood in stool, no family history of colon cancer. Nonsmoker Hep C neg PSA previously stable, not indicated after age 80. Father with prostate cancer. He does have some urinary frequency at times... does not want to look into with rectal exam or to treat at this time. Alcohol 2 cocktails a day.    Eliezer Lofts, MD

## 2018-11-16 NOTE — Assessment & Plan Note (Signed)
Diet controlled. Encouraged exercise, weight loss, healthy eating habits.  

## 2018-11-24 ENCOUNTER — Encounter: Payer: Self-pay | Admitting: Family Medicine

## 2018-12-01 DIAGNOSIS — L814 Other melanin hyperpigmentation: Secondary | ICD-10-CM | POA: Diagnosis not present

## 2018-12-01 DIAGNOSIS — C44729 Squamous cell carcinoma of skin of left lower limb, including hip: Secondary | ICD-10-CM | POA: Diagnosis not present

## 2018-12-01 DIAGNOSIS — Z85828 Personal history of other malignant neoplasm of skin: Secondary | ICD-10-CM | POA: Diagnosis not present

## 2018-12-01 DIAGNOSIS — C44712 Basal cell carcinoma of skin of right lower limb, including hip: Secondary | ICD-10-CM | POA: Diagnosis not present

## 2018-12-01 DIAGNOSIS — L821 Other seborrheic keratosis: Secondary | ICD-10-CM | POA: Diagnosis not present

## 2018-12-01 DIAGNOSIS — C44519 Basal cell carcinoma of skin of other part of trunk: Secondary | ICD-10-CM | POA: Diagnosis not present

## 2018-12-01 NOTE — Telephone Encounter (Signed)
Please see My Chart message.. pt needs to be seen today or tommorow. He was aske to call to make an appt but if he has not read message by 2 PM.. call to make sure he makes an appointment. Thanks!

## 2018-12-02 ENCOUNTER — Other Ambulatory Visit: Payer: Self-pay | Admitting: Family Medicine

## 2018-12-02 ENCOUNTER — Ambulatory Visit (INDEPENDENT_AMBULATORY_CARE_PROVIDER_SITE_OTHER): Payer: Medicare HMO | Admitting: Family Medicine

## 2018-12-02 ENCOUNTER — Encounter: Payer: Self-pay | Admitting: Family Medicine

## 2018-12-02 ENCOUNTER — Other Ambulatory Visit: Payer: Self-pay

## 2018-12-02 ENCOUNTER — Ambulatory Visit (INDEPENDENT_AMBULATORY_CARE_PROVIDER_SITE_OTHER)
Admission: RE | Admit: 2018-12-02 | Discharge: 2018-12-02 | Disposition: A | Discharge status: Home / Self Care | Payer: Medicare HMO | Source: Ambulatory Visit | Attending: Family Medicine | Admitting: Family Medicine

## 2018-12-02 VITALS — BP 112/70 | HR 88 | Temp 98.7°F | Ht 66.0 in | Wt 185.0 lb

## 2018-12-02 DIAGNOSIS — G35 Multiple sclerosis: Secondary | ICD-10-CM | POA: Diagnosis not present

## 2018-12-02 DIAGNOSIS — R200 Anesthesia of skin: Secondary | ICD-10-CM | POA: Diagnosis not present

## 2018-12-02 DIAGNOSIS — Z820 Family history of epilepsy and other diseases of the nervous system: Secondary | ICD-10-CM | POA: Insufficient documentation

## 2018-12-02 DIAGNOSIS — M47816 Spondylosis without myelopathy or radiculopathy, lumbar region: Secondary | ICD-10-CM | POA: Diagnosis not present

## 2018-12-02 DIAGNOSIS — M4807 Spinal stenosis, lumbosacral region: Secondary | ICD-10-CM

## 2018-12-02 DIAGNOSIS — G35D Multiple sclerosis, unspecified: Secondary | ICD-10-CM | POA: Insufficient documentation

## 2018-12-02 LAB — TSH: TSH: 2.69 u[IU]/mL (ref 0.35–4.50)

## 2018-12-02 LAB — CBC WITH DIFFERENTIAL/PLATELET
Basophils Absolute: 0 10*3/uL (ref 0.0–0.1)
Basophils Relative: 0.5 % (ref 0.0–3.0)
Eosinophils Absolute: 0.2 10*3/uL (ref 0.0–0.7)
Eosinophils Relative: 2.4 % (ref 0.0–5.0)
HCT: 44.7 % (ref 39.0–52.0)
Hemoglobin: 15.6 g/dL (ref 13.0–17.0)
Lymphocytes Relative: 21.8 % (ref 12.0–46.0)
Lymphs Abs: 1.8 10*3/uL (ref 0.7–4.0)
MCHC: 34.9 g/dL (ref 30.0–36.0)
MCV: 93 fl (ref 78.0–100.0)
Monocytes Absolute: 0.9 10*3/uL (ref 0.1–1.0)
Monocytes Relative: 10.7 % (ref 3.0–12.0)
Neutro Abs: 5.2 10*3/uL (ref 1.4–7.7)
Neutrophils Relative %: 64.6 % (ref 43.0–77.0)
Platelets: 198 10*3/uL (ref 150.0–400.0)
RBC: 4.81 Mil/uL (ref 4.22–5.81)
RDW: 13.2 % (ref 11.5–15.5)
WBC: 8.1 10*3/uL (ref 4.0–10.5)

## 2018-12-02 LAB — VITAMIN B12: Vitamin B-12: 309 pg/mL (ref 211–911)

## 2018-12-02 NOTE — Patient Instructions (Signed)
Please stop at the lab to have labs drawn and X-ray. We will call with results.Marland Kitchen

## 2018-12-02 NOTE — Addendum Note (Signed)
Addended by: Eliezer Lofts E on: 12/02/2018 01:34 PM   Modules accepted: Orders

## 2018-12-02 NOTE — Progress Notes (Signed)
Chief Complaint  Patient presents with  . Numbness    C/o numbness in bilateral feet causing balancing issues.  Started about 1 wk ago.     History of Present Illness: HPI  73 year old male presents with new onset numbness in both feet and in groin as well as new balance issues and stumbling. No sensation in right leg foot to thigh,  Numbness in left foot.  Feeling some numbness in perineum when wiping.  no weakness in legs...  Balance feels off..no dizziness.. feet don't follow  Where he is thinking about moving.  No low back pain. No neck pain, no arm symptoms. Works at [employer].Marland Kitchen gets in and out of cars 20 tines a day. No recent falls.  No trouble urinating..nml flow.. does have to go back and drips, incomplete emptying ongoing x 6 months.  No new meds.   2-3 drinks ETOH daily.  Hx:  No history of back surgeries, no back problem. Dx in 1970s with MS... was having numbness and dizziness. No further  Issues.. resolved.  Did not follow up further with neuro. No symptoms since.  COVID 19 screen No recent travel or known exposure to COVID19 The patient denies respiratory symptoms of COVID 19 at this time.  The importance of social distancing was discussed today.   Review of Systems  Constitutional: Negative for chills and fever.  HENT: Negative for congestion and ear pain.   Eyes: Negative for pain and redness.  Respiratory: Negative for cough and shortness of breath.   Cardiovascular: Negative for chest pain, palpitations and leg swelling.  Gastrointestinal: Negative for abdominal pain, blood in stool, constipation, diarrhea, nausea and vomiting.  Genitourinary: Negative for dysuria.  Musculoskeletal: Negative for falls and myalgias.  Skin: Negative for rash.  Neurological: Negative for dizziness.  Psychiatric/Behavioral: Negative for depression. The patient is not nervous/anxious.       Past Medical History:  Diagnosis Date  . Basal cell carcinoma    Bilateral  Legs  . CAD (coronary artery disease)    a.  stent to the RCA in 2001;  b.  Negative Myoview in January 2012;  c.  LHC 4/16:  dLM 20, small D1 50-70, D2 sub-total, oLCx 50-70, RCA stent ok, dPLB 90, EF 35-40% >> DES to D2  . Depression   . History of echocardiogram    a.  Echo 4/16: Mild LVH, EF 45-80%, grade 1 diastolic dysfunction, normal wall motion, MAC  . HLD (hyperlipidemia)   . HTN (hypertension)   . OSA (obstructive sleep apnea)   . Vertigo     reports that he quit smoking about 18 years ago. His smoking use included cigarettes. He has a 25.00 pack-year smoking history. He has never used smokeless tobacco. He reports current alcohol use of about 3.0 standard drinks of alcohol per week. He reports that he does not use drugs.   Current Outpatient Medications:  .  ALPRAZolam (XANAX) 0.25 MG tablet, Take 0.25 mg by mouth at bedtime as needed for sleep., Disp: , Rfl:  .  atorvastatin (LIPITOR) 40 MG tablet, TAKE 1 TABLET (40 MG TOTAL) BY MOUTH AT BEDTIME. PLEASE KEEP UPCOMING APPT WITH DR. Johnsie Cancel IN AUGUST FOR FUTURE REFILLS. THANK YOU, Disp: 90 tablet, Rfl: 3 .  carvedilol (COREG) 12.5 MG tablet, TAKE 1 TABLET (12.5 MG TOTAL) BY MOUTH 2 (TWO) TIMES DAILY., Disp: 180 tablet, Rfl: 2 .  clopidogrel (PLAVIX) 75 MG tablet, TAKE 1 TABLET BY MOUTH EVERY DAY, Disp: 90 tablet, Rfl: 3 .  isosorbide mononitrate (IMDUR) 60 MG 24 hr tablet, Take 1 tablet (60 mg total) by mouth daily., Disp: 90 tablet, Rfl: 3 .  lisinopril (ZESTRIL) 5 MG tablet, Take 1 tablet (5 mg total) by mouth daily., Disp: 90 tablet, Rfl: 3 .  Magnesium Oxide 200 MG TABS, Take 200 mg by mouth daily., Disp: , Rfl:  .  nitroGLYCERIN (NITROSTAT) 0.4 MG SL tablet, PLACE 1 TABLET UNDER THE TONGUE EVERY 5 MINUTES AS NEEDED FOR CHEST PAIN. MAX OF 3 DOSES, Disp: 25 tablet, Rfl: 0 .  omeprazole (PRILOSEC) 20 MG capsule, Take 1 capsule (20 mg total) by mouth daily. Please keep upcoming appt for future refills. Thank you., Disp: 90 capsule,  Rfl: 0 .  venlafaxine XR (EFFEXOR-XR) 75 MG 24 hr capsule, TAKE 1 CAPSULE (75 MG TOTAL) BY MOUTH DAILY WITH BREAKFAST., Disp: 90 capsule, Rfl: 1   Observations/Objective: Blood pressure 112/70, pulse 88, temperature 98.7 F (37.1 C), temperature source Temporal, height _0  (1.676 m), weight 185 lb (83.9 kg), SpO2 97 %.  Physical Exam Constitutional:      Appearance: He is well-developed.  HENT:     Head: Normocephalic.     Right Ear: Hearing normal.     Left Ear: Hearing normal.     Nose: Nose normal.  Neck:     Thyroid: No thyroid mass or thyromegaly.     Vascular: No carotid bruit.     Trachea: Trachea normal.  Cardiovascular:     Rate and Rhythm: Normal rate and regular rhythm.     Pulses: Normal pulses.     Heart sounds: Heart sounds not distant. No murmur. No friction rub. No gallop.      Comments: No peripheral edema Pulmonary:     Effort: Pulmonary effort is normal. No respiratory distress.     Breath sounds: Normal breath sounds.  Skin:    General: Skin is warm and dry.     Findings: No rash.  Neurological:     Cranial Nerves: Cranial nerves are intact.     Sensory: Sensory deficit present.     Motor: Motor function is intact. No weakness, tremor, atrophy or abnormal muscle tone.     Coordination: Romberg sign negative. Coordination abnormal. Finger-Nose-Finger Test normal.     Gait: Gait abnormal.     Comments: Total numbness in right leg, numbnes in left foot  Psychiatric:        Speech: Speech normal.        Behavior: Behavior normal.        Thought Content: Thought content normal.      Assessment and Plan   New onset numbness bilaterally in legs and in perineum... concerning for recurrence of MS vs spinal stenosis vs ETOH neuropathy.  Will eval with labs.. cbc, TSH, B12.  Stop ETOH use.  Eval with X-ray lumbar spine and likely follow with MRI spine urgently.  Out of work and no driving until symptoms resolved.   High level of medical  complexity.  Eliezer Lofts, MD

## 2018-12-06 NOTE — Telephone Encounter (Signed)
Pt's MRI authorization is in Physician Review.   If you would like to call and speak to MD reviewer, please call [phone], [phone].

## 2018-12-10 ENCOUNTER — Other Ambulatory Visit: Payer: Self-pay

## 2018-12-10 ENCOUNTER — Ambulatory Visit (HOSPITAL_COMMUNITY)
Admission: RE | Admit: 2018-12-10 | Discharge: 2018-12-10 | Disposition: A | Discharge status: Home / Self Care | Payer: Medicare HMO | Source: Ambulatory Visit | Attending: Family Medicine | Admitting: Family Medicine

## 2018-12-10 DIAGNOSIS — M4807 Spinal stenosis, lumbosacral region: Secondary | ICD-10-CM | POA: Diagnosis not present

## 2018-12-10 DIAGNOSIS — M48061 Spinal stenosis, lumbar region without neurogenic claudication: Secondary | ICD-10-CM | POA: Diagnosis not present

## 2018-12-14 ENCOUNTER — Other Ambulatory Visit: Payer: Self-pay | Admitting: Family Medicine

## 2018-12-14 MED ORDER — PREDNISONE 20 MG PO TABS: ORAL_TABLET | ORAL | 0 refills | Status: DC

## 2018-12-20 ENCOUNTER — Other Ambulatory Visit: Payer: Self-pay | Admitting: Cardiovascular Disease

## 2018-12-20 ENCOUNTER — Other Ambulatory Visit: Payer: Self-pay | Admitting: Family Medicine

## 2018-12-21 MED ORDER — PREDNISONE 20 MG PO TABS: ORAL_TABLET | ORAL | 0 refills | Status: DC

## 2019-01-17 DIAGNOSIS — L820 Inflamed seborrheic keratosis: Secondary | ICD-10-CM | POA: Diagnosis not present

## 2019-01-17 DIAGNOSIS — Z85828 Personal history of other malignant neoplasm of skin: Secondary | ICD-10-CM | POA: Diagnosis not present

## 2019-01-17 DIAGNOSIS — C44519 Basal cell carcinoma of skin of other part of trunk: Secondary | ICD-10-CM | POA: Diagnosis not present

## 2019-01-17 DIAGNOSIS — C44719 Basal cell carcinoma of skin of left lower limb, including hip: Secondary | ICD-10-CM | POA: Diagnosis not present

## 2019-01-17 DIAGNOSIS — C44712 Basal cell carcinoma of skin of right lower limb, including hip: Secondary | ICD-10-CM | POA: Diagnosis not present

## 2019-01-17 DIAGNOSIS — R69 Illness, unspecified: Secondary | ICD-10-CM | POA: Diagnosis not present

## 2019-01-17 DIAGNOSIS — C44729 Squamous cell carcinoma of skin of left lower limb, including hip: Secondary | ICD-10-CM | POA: Diagnosis not present

## 2019-03-21 DIAGNOSIS — Z85828 Personal history of other malignant neoplasm of skin: Secondary | ICD-10-CM | POA: Diagnosis not present

## 2019-03-21 DIAGNOSIS — L821 Other seborrheic keratosis: Secondary | ICD-10-CM | POA: Diagnosis not present

## 2019-03-21 DIAGNOSIS — D485 Neoplasm of uncertain behavior of skin: Secondary | ICD-10-CM | POA: Diagnosis not present

## 2019-03-21 DIAGNOSIS — D225 Melanocytic nevi of trunk: Secondary | ICD-10-CM | POA: Diagnosis not present

## 2019-06-18 ENCOUNTER — Other Ambulatory Visit: Payer: Self-pay | Admitting: Family Medicine

## 2019-07-01 ENCOUNTER — Other Ambulatory Visit: Payer: Self-pay

## 2019-07-01 ENCOUNTER — Encounter: Payer: Self-pay | Admitting: Family Medicine

## 2019-07-01 ENCOUNTER — Ambulatory Visit (INDEPENDENT_AMBULATORY_CARE_PROVIDER_SITE_OTHER): Payer: Medicare HMO | Admitting: Family Medicine

## 2019-07-01 VITALS — BP 78/57 | HR 105 | Temp 96.8°F | Ht 66.0 in | Wt 178.5 lb

## 2019-07-01 DIAGNOSIS — E860 Dehydration: Secondary | ICD-10-CM | POA: Diagnosis not present

## 2019-07-01 DIAGNOSIS — R197 Diarrhea, unspecified: Secondary | ICD-10-CM | POA: Diagnosis not present

## 2019-07-01 DIAGNOSIS — R195 Other fecal abnormalities: Secondary | ICD-10-CM

## 2019-07-01 DIAGNOSIS — E861 Hypovolemia: Secondary | ICD-10-CM

## 2019-07-01 DIAGNOSIS — I9589 Other hypotension: Secondary | ICD-10-CM

## 2019-07-01 DIAGNOSIS — I959 Hypotension, unspecified: Secondary | ICD-10-CM | POA: Insufficient documentation

## 2019-07-01 LAB — CBC WITH DIFFERENTIAL/PLATELET
Basophils Absolute: 0 10*3/uL (ref 0.0–0.1)
Basophils Relative: 0.4 % (ref 0.0–3.0)
Eosinophils Absolute: 0.1 10*3/uL (ref 0.0–0.7)
Eosinophils Relative: 1.1 % (ref 0.0–5.0)
HCT: 44 % (ref 39.0–52.0)
Hemoglobin: 15 g/dL (ref 13.0–17.0)
Lymphocytes Relative: 15.6 % (ref 12.0–46.0)
Lymphs Abs: 1.8 10*3/uL (ref 0.7–4.0)
MCHC: 34.1 g/dL (ref 30.0–36.0)
MCV: 89.2 fl (ref 78.0–100.0)
Monocytes Absolute: 1.4 10*3/uL — ABNORMAL HIGH (ref 0.1–1.0)
Monocytes Relative: 12.6 % — ABNORMAL HIGH (ref 3.0–12.0)
Neutro Abs: 8 10*3/uL — ABNORMAL HIGH (ref 1.4–7.7)
Neutrophils Relative %: 70.3 % (ref 43.0–77.0)
Platelets: 278 10*3/uL (ref 150.0–400.0)
RBC: 4.93 Mil/uL (ref 4.22–5.81)
RDW: 13.6 % (ref 11.5–15.5)
WBC: 11.4 10*3/uL — ABNORMAL HIGH (ref 4.0–10.5)

## 2019-07-01 LAB — COMPREHENSIVE METABOLIC PANEL
ALT: 19 U/L (ref 0–53)
AST: 11 U/L (ref 0–37)
Albumin: 4 g/dL (ref 3.5–5.2)
Alkaline Phosphatase: 75 U/L (ref 39–117)
BUN: 15 mg/dL (ref 6–23)
CO2: 27 mEq/L (ref 19–32)
Calcium: 9.4 mg/dL (ref 8.4–10.5)
Chloride: 101 mEq/L (ref 96–112)
Creatinine, Ser: 1.32 mg/dL (ref 0.40–1.50)
GFR: 53.11 mL/min — ABNORMAL LOW (ref >60.00)
Glucose, Bld: 137 mg/dL — ABNORMAL HIGH (ref 70–99)
Potassium: 4.3 mEq/L (ref 3.5–5.1)
Sodium: 135 mEq/L (ref 135–145)
Total Bilirubin: 0.9 mg/dL (ref 0.2–1.2)
Total Protein: 6.5 g/dL (ref 6.0–8.3)

## 2019-07-01 MED ORDER — ONDANSETRON HCL 4 MG PO TABS: 4.0000 mg | ORAL_TABLET | Freq: Three times a day (TID) | ORAL | 0 refills | Status: DC | PRN

## 2019-07-01 NOTE — Progress Notes (Signed)
Chief Complaint  Patient presents with  . Back Pain    Radiates around to abdomen  . Nausea  . Dark Stools  . Loss control of stool    x 3 occassions    History of Present Illness: HPI    74 year old male with history of  Dm, CAD, HTN, GERD presents with new onset in last 4 days of stool incontinence x 3 .. stool has been darker black, loose.  He has been nauseous and vomiting intermittently in last 2 days. None in last 2 days. Able to keep down liquids.. but only has small V8 this AM... no fluids last night.  Low abdominal pain bilaterally 4-7/10 intermittent, increased gurgling.  Increased gas.  Last BM small and loose and black.    He has been taking pepto bismol since first day ( stool started brown and changed to dark black later in illness) took some immodium.  He has taken BP meds today at 7:45 AM. He is on plavix.Marland Kitchen blood thinner. No   Using omeprazole 20 mg for GERD.   He is not lightheaded, but very tired.  No CP, no SOB. This visit occurred during the SARS-CoV-2 public health emergency.  Safety protocols were in place, including screening questions prior to the visit, additional usage of staff PPE, and extensive cleaning of exam room while observing appropriate contact time as indicated for disinfecting solutions.   COVID 19 screen:  No recent travel or known exposure to COVID19 The patient denies respiratory symptoms of COVID 19 at this time. The importance of social distancing was discussed today.     Review of Systems  Constitutional: Positive for malaise/fatigue. Negative for chills and fever.  HENT: Negative for congestion and ear pain.   Eyes: Negative for pain and redness.  Respiratory: Negative for cough and shortness of breath.   Cardiovascular: Negative for chest pain, palpitations and leg swelling.  Gastrointestinal: Positive for abdominal pain, melena, nausea and vomiting. Negative for constipation and diarrhea.  Genitourinary: Negative for  dysuria.  Musculoskeletal: Negative for falls and myalgias.  Skin: Negative for rash.  Neurological: Positive for weakness. Negative for dizziness, focal weakness, seizures and loss of consciousness.  Psychiatric/Behavioral: Negative for depression. The patient is not nervous/anxious.       Past Medical History:  Diagnosis Date  . Basal cell carcinoma    Bilateral Legs  . CAD (coronary artery disease)    a.  stent to the RCA in 2001;  b.  Negative Myoview in January 2012;  c.  LHC 4/16:  dLM 20, small D1 50-70, D2 sub-total, oLCx 50-70, RCA stent ok, dPLB 90, EF 35-40% >> DES to D2  . Depression   . History of echocardiogram    a.  Echo 4/16: Mild LVH, EF 60-45%, grade 1 diastolic dysfunction, normal wall motion, MAC  . HLD (hyperlipidemia)   . HTN (hypertension)   . OSA (obstructive sleep apnea)   . Vertigo     reports that he quit smoking about 19 years ago. His smoking use included cigarettes. He has a 25.00 pack-year smoking history. He has never used smokeless tobacco. He reports current alcohol use of about 3.0 standard drinks of alcohol per week. He reports that he does not use drugs.   Current Outpatient Medications:  .  ALPRAZolam (XANAX) 0.25 MG tablet, Take 0.25 mg by mouth at bedtime as needed for sleep., Disp: , Rfl:  .  atorvastatin (LIPITOR) 40 MG tablet, TAKE 1 TABLET (40 MG TOTAL)  BY MOUTH AT BEDTIME. PLEASE KEEP UPCOMING APPT WITH DR. Johnsie Cancel IN AUGUST FOR FUTURE REFILLS. THANK YOU, Disp: 90 tablet, Rfl: 3 .  carvedilol (COREG) 12.5 MG tablet, TAKE 1 TABLET (12.5 MG TOTAL) BY MOUTH 2 (TWO) TIMES DAILY., Disp: 180 tablet, Rfl: 2 .  clopidogrel (PLAVIX) 75 MG tablet, TAKE 1 TABLET BY MOUTH EVERY DAY, Disp: 90 tablet, Rfl: 3 .  isosorbide mononitrate (IMDUR) 60 MG 24 hr tablet, Take 1 tablet (60 mg total) by mouth daily., Disp: 90 tablet, Rfl: 3 .  lisinopril (ZESTRIL) 5 MG tablet, Take 1 tablet (5 mg total) by mouth daily., Disp: 90 tablet, Rfl: 3 .  Magnesium Oxide 200  MG TABS, Take 200 mg by mouth daily., Disp: , Rfl:  .  nitroGLYCERIN (NITROSTAT) 0.4 MG SL tablet, PLACE 1 TABLET UNDER THE TONGUE EVERY 5 MINUTES AS NEEDED FOR CHEST PAIN. MAX OF 3 DOSES, Disp: 25 tablet, Rfl: 0 .  omeprazole (PRILOSEC) 20 MG capsule, Take 1 capsule (20 mg total) by mouth daily., Disp: 90 capsule, Rfl: 2 .  venlafaxine XR (EFFEXOR-XR) 75 MG 24 hr capsule, TAKE 1 CAPSULE (75 MG TOTAL) BY MOUTH DAILY WITH BREAKFAST., Disp: 90 capsule, Rfl: 1   Observations/Objective: Blood pressure (!) 78/62, pulse (!) 102, temperature (!) 96.8 F (36 C), temperature source Temporal, height _0  (1.676 m), weight 178 lb 8 oz (81 kg), SpO2 95 %.  Physical Exam Constitutional:      Appearance: He is well-developed.  HENT:     Head: Normocephalic.     Right Ear: Hearing normal.     Left Ear: Hearing normal.     Nose: Nose normal.  Neck:     Thyroid: No thyroid mass or thyromegaly.     Vascular: No carotid bruit.     Trachea: Trachea normal.  Cardiovascular:     Rate and Rhythm: Normal rate and regular rhythm.     Pulses: Normal pulses.     Heart sounds: Heart sounds not distant. No murmur heard.  No friction rub. No gallop.      Comments: No peripheral edema Pulmonary:     Effort: Pulmonary effort is normal. No respiratory distress.     Breath sounds: Normal breath sounds.  Abdominal:     General: Bowel sounds are increased. There is distension.     Tenderness: There is abdominal tenderness in the epigastric area. There is no right CVA tenderness, left CVA tenderness, guarding or rebound.     Hernia: No hernia is present.  Genitourinary:    Rectum: Normal. Guaiac result negative. No mass, tenderness, anal fissure, external hemorrhoid or internal hemorrhoid.  Skin:    General: Skin is warm and dry.     Findings: No rash.  Psychiatric:        Speech: Speech normal.        Behavior: Behavior normal.        Thought Content: Thought content normal.        Assessment and  Plan Hypotension Hold BP meds. Increase fluid intake as toelraing po. Use zofran as needed for N/V.  Pt refuses ER visit for IVF but will have low threshold for ER visit.  Dark stools Likely due to pepto bismol. Neg hemeoccult stool test in office today.  Pearletha Furl started brown until started pepto bismol.  Diarrhea Likely due to gastroenteritis. Eval with labs and GI path panel.   Focus is rehydration.Marland Kitchen gradually increase fluids.  Acute dehydration Pt non toxica and now tolerating po.. oral  rehydration.       Eliezer Lofts, MD

## 2019-07-01 NOTE — Assessment & Plan Note (Signed)
Likely due to pepto bismol. Neg hemeoccult stool test in office today.  Pearletha Furl started brown until started pepto bismol.

## 2019-07-01 NOTE — Patient Instructions (Addendum)
Hold lisinopril and coreg. Do not take afternoon dose of Coreg. Follow blood pressures daily.. restart when BP > 140/90. Increase omeprazole to 40 mg daily. Push fluids likely gatorade or vitamin water, water. This is very important. Can use zofran for nausea. Please stop at the lab to have labs drawn. Stop pepto bismol.  Go to ER if cannot keep dwon liquids.. BP remains < 90/60, lightheadedness, severe abdominal pain.

## 2019-07-01 NOTE — Assessment & Plan Note (Signed)
Pt non toxica and now tolerating po.. oral rehydration.

## 2019-07-01 NOTE — Assessment & Plan Note (Signed)
Likely due to gastroenteritis. Eval with labs and GI path panel.   Focus is rehydration.Marland Kitchen gradually increase fluids.

## 2019-07-01 NOTE — Assessment & Plan Note (Signed)
Hold BP meds. Increase fluid intake as toelraing po. Use zofran as needed for N/V.  Pt refuses ER visit for IVF but will have low threshold for ER visit.

## 2019-07-05 ENCOUNTER — Other Ambulatory Visit (INDEPENDENT_AMBULATORY_CARE_PROVIDER_SITE_OTHER): Payer: Medicare HMO

## 2019-07-05 DIAGNOSIS — R195 Other fecal abnormalities: Secondary | ICD-10-CM

## 2019-07-05 DIAGNOSIS — R197 Diarrhea, unspecified: Secondary | ICD-10-CM

## 2019-07-08 ENCOUNTER — Telehealth: Payer: Self-pay | Admitting: Family Medicine

## 2019-07-08 LAB — GASTROINTESTINAL PATHOGEN PANEL PCR
C. difficile Tox A/B, PCR: NOT DETECTED
Campylobacter, PCR: NOT DETECTED
Cryptosporidium, PCR: NOT DETECTED
E coli (ETEC) LT/ST PCR: NOT DETECTED
E coli (STEC) stx1/stx2, PCR: NOT DETECTED
E coli 0157, PCR: NOT DETECTED
Giardia lamblia, PCR: NOT DETECTED
Norovirus, PCR: NOT DETECTED
Rotavirus A, PCR: NOT DETECTED
Salmonella, PCR: NOT DETECTED
Shigella, PCR: NOT DETECTED

## 2019-07-08 NOTE — Telephone Encounter (Addendum)
Pt seen 07/01/19 with abd pain; pt still has sharp to dull abd pain on and off and no pain now but when has pain is pain level of 7. Pt said he stopped pepto on 03/31/19 but still has black tarry stools and pt has no appetite so is not eating a lot and is trying to drink more fluids. Pt has diarrhea still but no worse than when seen on 07/01/19.today pt is traveling with work and is in [facility] and pt does not know have way to ck fever, pt feels warm and having chills. Pt had both pfizer covid vaccines. Pt said he does not feel worse than when seen on 07/01/19 but he does not feel better either.Dr Lorelei Pont said to send Dr Diona Browner a note. I spoke with Larene Beach RN and she said I could schedule appt on 07/12/19 at 9:20 with Dr Diona Browner and send note to Dr Diona Browner about above symptoms; if pt continues with ? Fever, chills and diarrhea pt will call on 06/29/212 early morning to see what recommendations Dr Diona Browner would have about pt coming into office. Pt said he cannot do virtual. UC & ED precautions given and pt voiced understanding.

## 2019-07-08 NOTE — Telephone Encounter (Signed)
Opened in error

## 2019-07-11 NOTE — Telephone Encounter (Signed)
See result note.  

## 2019-07-12 ENCOUNTER — Encounter: Payer: Self-pay | Admitting: Family Medicine

## 2019-07-12 ENCOUNTER — Ambulatory Visit (INDEPENDENT_AMBULATORY_CARE_PROVIDER_SITE_OTHER): Payer: Medicare HMO | Admitting: Family Medicine

## 2019-07-12 ENCOUNTER — Other Ambulatory Visit: Payer: Self-pay

## 2019-07-12 VITALS — BP 95/71 | HR 84 | Temp 97.4°F | Ht 66.0 in | Wt 173.2 lb

## 2019-07-12 DIAGNOSIS — I9589 Other hypotension: Secondary | ICD-10-CM

## 2019-07-12 DIAGNOSIS — R195 Other fecal abnormalities: Secondary | ICD-10-CM

## 2019-07-12 DIAGNOSIS — E861 Hypovolemia: Secondary | ICD-10-CM

## 2019-07-12 DIAGNOSIS — R197 Diarrhea, unspecified: Secondary | ICD-10-CM | POA: Diagnosis not present

## 2019-07-12 LAB — COMPREHENSIVE METABOLIC PANEL
ALT: 22 U/L (ref 0–53)
AST: 13 U/L (ref 0–37)
Albumin: 4.3 g/dL (ref 3.5–5.2)
Alkaline Phosphatase: 86 U/L (ref 39–117)
BUN: 12 mg/dL (ref 6–23)
CO2: 28 mEq/L (ref 19–32)
Calcium: 9.9 mg/dL (ref 8.4–10.5)
Chloride: 100 mEq/L (ref 96–112)
Creatinine, Ser: 1.25 mg/dL (ref 0.40–1.50)
GFR: 56.55 mL/min — ABNORMAL LOW (ref >60.00)
Glucose, Bld: 124 mg/dL — ABNORMAL HIGH (ref 70–99)
Potassium: 4.2 mEq/L (ref 3.5–5.1)
Sodium: 136 mEq/L (ref 135–145)
Total Bilirubin: 0.8 mg/dL (ref 0.2–1.2)
Total Protein: 6.9 g/dL (ref 6.0–8.3)

## 2019-07-12 LAB — CBC WITH DIFFERENTIAL/PLATELET
Basophils Absolute: 0 10*3/uL (ref 0.0–0.1)
Basophils Relative: 0.4 % (ref 0.0–3.0)
Eosinophils Absolute: 0.1 10*3/uL (ref 0.0–0.7)
Eosinophils Relative: 0.6 % (ref 0.0–5.0)
HCT: 45.1 % (ref 39.0–52.0)
Hemoglobin: 15.8 g/dL (ref 13.0–17.0)
Lymphocytes Relative: 17 % (ref 12.0–46.0)
Lymphs Abs: 1.9 10*3/uL (ref 0.7–4.0)
MCHC: 35.1 g/dL (ref 30.0–36.0)
MCV: 91.1 fl (ref 78.0–100.0)
Monocytes Absolute: 1.3 10*3/uL — ABNORMAL HIGH (ref 0.1–1.0)
Monocytes Relative: 11.7 % (ref 3.0–12.0)
Neutro Abs: 7.7 10*3/uL (ref 1.4–7.7)
Neutrophils Relative %: 70.3 % (ref 43.0–77.0)
Platelets: 286 10*3/uL (ref 150.0–400.0)
RBC: 4.95 Mil/uL (ref 4.22–5.81)
RDW: 13.6 % (ref 11.5–15.5)
WBC: 11 10*3/uL — ABNORMAL HIGH (ref 4.0–10.5)

## 2019-07-12 NOTE — Progress Notes (Signed)
Chief Complaint  Patient presents with  . Follow-up    Adb pain/diarrhea/gas    History of Present Illness: HPI   74 year old male presents with continued abdominal pain , gas and diarrhea.  Seen on 6.18.2021 for similar CBC : wbc 11.4 but Hg 15 CMEt nml GI panel negative. Heme occult at last OV negative.  He is feeling some better overall He reports in last week he has continue to have gurgles and fluctuance.  Feels gas pain centrally and pressure.. when gas passes he feels better.  He is having loose stool once daily.Marland Kitchen still black color.  No brbpr   No appetite. Occ nausea.Marland Kitchen eating minimally. No emesis.   He is taking omeprazole  40 mg daily.  He is feeling very tired and weak. occ lightheaded.  Stopped pepto bismol but stool remains dark.  BPs at home 161 systolic  He has stopped the plavix and the coreg in the last week.  not using  ibuprofen or aleve. ( did take 3 tab once in last 2 weeks)  Wt Readings from Last 3 Encounters:  07/12/19 173 lb 4 oz (78.6 kg)  07/01/19 178 lb 8 oz (81 kg)  12/02/18 185 lb (83.9 kg)     This visit occurred during the SARS-CoV-2 public health emergency.  Safety protocols were in place, including screening questions prior to the visit, additional usage of staff PPE, and extensive cleaning of exam room while observing appropriate contact time as indicated for disinfecting solutions.   COVID 19 screen:  No recent travel or known exposure to COVID19 The patient denies respiratory symptoms of COVID 19 at this time. The importance of social distancing was discussed today.     Review of Systems  Constitutional: Negative for chills and fever.  HENT: Negative for congestion and ear pain.   Eyes: Negative for pain and redness.  Respiratory: Negative for cough and shortness of breath.   Cardiovascular: Negative for chest pain, palpitations and leg swelling.  Gastrointestinal: Positive for diarrhea. Negative for abdominal pain, blood in  stool, constipation, nausea and vomiting.  Genitourinary: Negative for dysuria.  Musculoskeletal: Negative for falls and myalgias.  Skin: Negative for rash.  Neurological: Negative for dizziness.  Psychiatric/Behavioral: Negative for depression. The patient is not nervous/anxious.     Wt Readings from Last 3 Encounters:  07/12/19 173 lb 4 oz (78.6 kg)  07/01/19 178 lb 8 oz (81 kg)  12/02/18 185 lb (83.9 kg)      Past Medical History:  Diagnosis Date  . Basal cell carcinoma    Bilateral Legs  . CAD (coronary artery disease)    a.  stent to the RCA in 2001;  b.  Negative Myoview in January 2012;  c.  LHC 4/16:  dLM 20, small D1 50-70, D2 sub-total, oLCx 50-70, RCA stent ok, dPLB 90, EF 35-40% >> DES to D2  . Depression   . History of echocardiogram    a.  Echo 4/16: Mild LVH, EF 09-60%, grade 1 diastolic dysfunction, normal wall motion, MAC  . HLD (hyperlipidemia)   . HTN (hypertension)   . OSA (obstructive sleep apnea)   . Vertigo     reports that he quit smoking about 19 years ago. His smoking use included cigarettes. He has a 25.00 pack-year smoking history. He has never used smokeless tobacco. He reports current alcohol use of about 3.0 standard drinks of alcohol per week. He reports that he does not use drugs.   Current Outpatient Medications:  .  ALPRAZolam (XANAX) 0.25 MG tablet, Take 0.25 mg by mouth at bedtime as needed for sleep., Disp: , Rfl:  .  atorvastatin (LIPITOR) 40 MG tablet, TAKE 1 TABLET (40 MG TOTAL) BY MOUTH AT BEDTIME. PLEASE KEEP UPCOMING APPT WITH DR. Johnsie Cancel IN AUGUST FOR FUTURE REFILLS. THANK YOU, Disp: 90 tablet, Rfl: 3 .  carvedilol (COREG) 12.5 MG tablet, TAKE 1 TABLET (12.5 MG TOTAL) BY MOUTH 2 (TWO) TIMES DAILY., Disp: 180 tablet, Rfl: 2 .  clopidogrel (PLAVIX) 75 MG tablet, TAKE 1 TABLET BY MOUTH EVERY DAY, Disp: 90 tablet, Rfl: 3 .  isosorbide mononitrate (IMDUR) 60 MG 24 hr tablet, Take 1 tablet (60 mg total) by mouth daily., Disp: 90 tablet, Rfl:  3 .  lisinopril (ZESTRIL) 5 MG tablet, Take 1 tablet (5 mg total) by mouth daily., Disp: 90 tablet, Rfl: 3 .  Magnesium Oxide 200 MG TABS, Take 200 mg by mouth daily., Disp: , Rfl:  .  nitroGLYCERIN (NITROSTAT) 0.4 MG SL tablet, PLACE 1 TABLET UNDER THE TONGUE EVERY 5 MINUTES AS NEEDED FOR CHEST PAIN. MAX OF 3 DOSES, Disp: 25 tablet, Rfl: 0 .  omeprazole (PRILOSEC) 20 MG capsule, Take 1 capsule (20 mg total) by mouth daily., Disp: 90 capsule, Rfl: 2 .  ondansetron (ZOFRAN) 4 MG tablet, Take 1 tablet (4 mg total) by mouth every 8 (eight) hours as needed for nausea or vomiting., Disp: 20 tablet, Rfl: 0 .  venlafaxine XR (EFFEXOR-XR) 75 MG 24 hr capsule, TAKE 1 CAPSULE (75 MG TOTAL) BY MOUTH DAILY WITH BREAKFAST., Disp: 90 capsule, Rfl: 1   Observations/Objective: Blood pressure 95/71, pulse 84, temperature (!) 97.4 F (36.3 C), temperature source Temporal, height _0  (1.676 m), weight 173 lb 4 oz (78.6 kg), SpO2 95 %.  Physical Exam Constitutional:      Appearance: He is well-developed.  HENT:     Head: Normocephalic.     Right Ear: Hearing normal.     Left Ear: Hearing normal.     Nose: Nose normal.  Neck:     Thyroid: No thyroid mass or thyromegaly.     Vascular: No carotid bruit.     Trachea: Trachea normal.  Cardiovascular:     Rate and Rhythm: Normal rate and regular rhythm.     Pulses: Normal pulses.     Heart sounds: Heart sounds not distant. No murmur heard.  No friction rub. No gallop.      Comments: No peripheral edema Pulmonary:     Effort: Pulmonary effort is normal. No respiratory distress.     Breath sounds: Normal breath sounds.  Abdominal:     General: There is no distension.     Palpations: There is no fluid wave.     Tenderness: There is abdominal tenderness in the right upper quadrant, epigastric area and left upper quadrant. There is no right CVA tenderness or left CVA tenderness.     Hernia: No hernia is present.  Genitourinary:    Rectum: Guaiac result  negative. No mass, tenderness, external hemorrhoid or internal hemorrhoid.  Skin:    General: Skin is warm and dry.     Findings: No rash.  Psychiatric:        Speech: Speech normal.        Behavior: Behavior normal.        Thought Content: Thought content normal.      Assessment and Plan  Dark stool and diarrhea: decreased frequency. Repeat hemeoccult negative again.  Neg GI panel.  Re-eval cbc  for hemoglobin drop... continue omeprazole 40 mg daily. May need to change to pantoprazole 40 mg daily.Marland Kitchen and GI referral. If cbc stable... restart plavix as no verified blood in stool.   Re-eval cbc to determine if wbc change reflecting continue or resolving infection.       Eliezer Lofts, MD

## 2019-07-12 NOTE — Patient Instructions (Addendum)
Please stop at the lab to have labs drawn. Can use tylenol for pain. Stay off coreg until BP increased to > 130/90.   Continue omeprazole 40 mg daily for now.

## 2019-07-16 DIAGNOSIS — R197 Diarrhea, unspecified: Secondary | ICD-10-CM

## 2019-07-19 NOTE — Telephone Encounter (Signed)
Patient left a voicemail wanting to make sure that you got this mychart message and requested a call back regarding this.

## 2019-07-20 NOTE — Telephone Encounter (Signed)
Pt is following up on his message regarding him needing a specialist.

## 2019-07-21 ENCOUNTER — Encounter: Payer: Self-pay | Admitting: Nurse Practitioner

## 2019-07-23 ENCOUNTER — Emergency Department (HOSPITAL_COMMUNITY): Payer: Medicare HMO

## 2019-07-23 ENCOUNTER — Encounter (HOSPITAL_COMMUNITY): Payer: Self-pay

## 2019-07-23 ENCOUNTER — Other Ambulatory Visit: Payer: Self-pay

## 2019-07-23 ENCOUNTER — Inpatient Hospital Stay (HOSPITAL_COMMUNITY)
Admission: AD | Admit: 2019-07-23 | Discharge: 2019-07-28 | DRG: 388 | Disposition: A | Discharge status: Home / Self Care | Payer: Medicare HMO | Attending: Internal Medicine | Admitting: Internal Medicine

## 2019-07-23 DIAGNOSIS — E46 Unspecified protein-calorie malnutrition: Secondary | ICD-10-CM | POA: Diagnosis not present

## 2019-07-23 DIAGNOSIS — K921 Melena: Secondary | ICD-10-CM | POA: Diagnosis not present

## 2019-07-23 DIAGNOSIS — K529 Noninfective gastroenteritis and colitis, unspecified: Secondary | ICD-10-CM | POA: Diagnosis not present

## 2019-07-23 DIAGNOSIS — K6389 Other specified diseases of intestine: Secondary | ICD-10-CM | POA: Diagnosis present

## 2019-07-23 DIAGNOSIS — E1159 Type 2 diabetes mellitus with other circulatory complications: Secondary | ICD-10-CM | POA: Diagnosis present

## 2019-07-23 DIAGNOSIS — N179 Acute kidney failure, unspecified: Secondary | ICD-10-CM | POA: Diagnosis present

## 2019-07-23 DIAGNOSIS — D649 Anemia, unspecified: Secondary | ICD-10-CM | POA: Diagnosis not present

## 2019-07-23 DIAGNOSIS — G4733 Obstructive sleep apnea (adult) (pediatric): Secondary | ICD-10-CM | POA: Diagnosis present

## 2019-07-23 DIAGNOSIS — N1831 Chronic kidney disease, stage 3a: Secondary | ICD-10-CM | POA: Diagnosis not present

## 2019-07-23 DIAGNOSIS — R112 Nausea with vomiting, unspecified: Secondary | ICD-10-CM | POA: Diagnosis not present

## 2019-07-23 DIAGNOSIS — R1110 Vomiting, unspecified: Secondary | ICD-10-CM | POA: Diagnosis not present

## 2019-07-23 DIAGNOSIS — Z7902 Long term (current) use of antithrombotics/antiplatelets: Secondary | ICD-10-CM

## 2019-07-23 DIAGNOSIS — E876 Hypokalemia: Secondary | ICD-10-CM | POA: Diagnosis not present

## 2019-07-23 DIAGNOSIS — I129 Hypertensive chronic kidney disease with stage 1 through stage 4 chronic kidney disease, or unspecified chronic kidney disease: Secondary | ICD-10-CM | POA: Diagnosis not present

## 2019-07-23 DIAGNOSIS — R748 Abnormal levels of other serum enzymes: Secondary | ICD-10-CM | POA: Diagnosis not present

## 2019-07-23 DIAGNOSIS — R197 Diarrhea, unspecified: Secondary | ICD-10-CM | POA: Diagnosis not present

## 2019-07-23 DIAGNOSIS — I2510 Atherosclerotic heart disease of native coronary artery without angina pectoris: Secondary | ICD-10-CM | POA: Diagnosis not present

## 2019-07-23 DIAGNOSIS — K5730 Diverticulosis of large intestine without perforation or abscess without bleeding: Secondary | ICD-10-CM | POA: Diagnosis present

## 2019-07-23 DIAGNOSIS — Z818 Family history of other mental and behavioral disorders: Secondary | ICD-10-CM

## 2019-07-23 DIAGNOSIS — Z85828 Personal history of other malignant neoplasm of skin: Secondary | ICD-10-CM

## 2019-07-23 DIAGNOSIS — Z20822 Contact with and (suspected) exposure to covid-19: Secondary | ICD-10-CM | POA: Diagnosis not present

## 2019-07-23 DIAGNOSIS — N189 Chronic kidney disease, unspecified: Secondary | ICD-10-CM

## 2019-07-23 DIAGNOSIS — F329 Major depressive disorder, single episode, unspecified: Secondary | ICD-10-CM | POA: Diagnosis present

## 2019-07-23 DIAGNOSIS — G35 Multiple sclerosis: Secondary | ICD-10-CM | POA: Diagnosis not present

## 2019-07-23 DIAGNOSIS — I898 Other specified noninfective disorders of lymphatic vessels and lymph nodes: Secondary | ICD-10-CM | POA: Diagnosis not present

## 2019-07-23 DIAGNOSIS — E871 Hypo-osmolality and hyponatremia: Secondary | ICD-10-CM | POA: Diagnosis present

## 2019-07-23 DIAGNOSIS — E785 Hyperlipidemia, unspecified: Secondary | ICD-10-CM | POA: Diagnosis not present

## 2019-07-23 DIAGNOSIS — K56609 Unspecified intestinal obstruction, unspecified as to partial versus complete obstruction: Secondary | ICD-10-CM | POA: Diagnosis not present

## 2019-07-23 DIAGNOSIS — Z87891 Personal history of nicotine dependence: Secondary | ICD-10-CM

## 2019-07-23 DIAGNOSIS — I700 Atherosclerosis of aorta: Secondary | ICD-10-CM | POA: Diagnosis not present

## 2019-07-23 DIAGNOSIS — Z6826 Body mass index (BMI) 26.0-26.9, adult: Secondary | ICD-10-CM

## 2019-07-23 DIAGNOSIS — Z955 Presence of coronary angioplasty implant and graft: Secondary | ICD-10-CM

## 2019-07-23 DIAGNOSIS — R933 Abnormal findings on diagnostic imaging of other parts of digestive tract: Secondary | ICD-10-CM

## 2019-07-23 DIAGNOSIS — Z79899 Other long term (current) drug therapy: Secondary | ICD-10-CM

## 2019-07-23 DIAGNOSIS — G4700 Insomnia, unspecified: Secondary | ICD-10-CM | POA: Diagnosis not present

## 2019-07-23 DIAGNOSIS — R52 Pain, unspecified: Secondary | ICD-10-CM | POA: Diagnosis not present

## 2019-07-23 DIAGNOSIS — E860 Dehydration: Secondary | ICD-10-CM | POA: Diagnosis not present

## 2019-07-23 DIAGNOSIS — R1084 Generalized abdominal pain: Secondary | ICD-10-CM | POA: Diagnosis not present

## 2019-07-23 DIAGNOSIS — I252 Old myocardial infarction: Secondary | ICD-10-CM

## 2019-07-23 DIAGNOSIS — E1122 Type 2 diabetes mellitus with diabetic chronic kidney disease: Secondary | ICD-10-CM | POA: Diagnosis present

## 2019-07-23 DIAGNOSIS — E43 Unspecified severe protein-calorie malnutrition: Secondary | ICD-10-CM | POA: Insufficient documentation

## 2019-07-23 DIAGNOSIS — K219 Gastro-esophageal reflux disease without esophagitis: Secondary | ICD-10-CM | POA: Diagnosis present

## 2019-07-23 DIAGNOSIS — J9811 Atelectasis: Secondary | ICD-10-CM | POA: Diagnosis not present

## 2019-07-23 DIAGNOSIS — K5669 Other partial intestinal obstruction: Secondary | ICD-10-CM | POA: Diagnosis not present

## 2019-07-23 DIAGNOSIS — I10 Essential (primary) hypertension: Secondary | ICD-10-CM | POA: Diagnosis not present

## 2019-07-23 DIAGNOSIS — R69 Illness, unspecified: Secondary | ICD-10-CM | POA: Diagnosis not present

## 2019-07-23 DIAGNOSIS — K5939 Other megacolon: Secondary | ICD-10-CM | POA: Diagnosis not present

## 2019-07-23 DIAGNOSIS — E1165 Type 2 diabetes mellitus with hyperglycemia: Secondary | ICD-10-CM | POA: Diagnosis present

## 2019-07-23 DIAGNOSIS — R110 Nausea: Secondary | ICD-10-CM | POA: Diagnosis not present

## 2019-07-23 DIAGNOSIS — K56600 Partial intestinal obstruction, unspecified as to cause: Principal | ICD-10-CM | POA: Diagnosis present

## 2019-07-23 DIAGNOSIS — Z01810 Encounter for preprocedural cardiovascular examination: Secondary | ICD-10-CM | POA: Diagnosis not present

## 2019-07-23 DIAGNOSIS — Z8249 Family history of ischemic heart disease and other diseases of the circulatory system: Secondary | ICD-10-CM

## 2019-07-23 DIAGNOSIS — K56699 Other intestinal obstruction unspecified as to partial versus complete obstruction: Secondary | ICD-10-CM | POA: Diagnosis not present

## 2019-07-23 LAB — CBC WITH DIFFERENTIAL/PLATELET
Abs Immature Granulocytes: 0.08 10*3/uL — ABNORMAL HIGH (ref 0.00–0.07)
Basophils Absolute: 0 10*3/uL (ref 0.0–0.1)
Basophils Relative: 0 %
Eosinophils Absolute: 0.1 10*3/uL (ref 0.0–0.5)
Eosinophils Relative: 1 %
HCT: 48.7 % (ref 39.0–52.0)
Hemoglobin: 16.6 g/dL (ref 13.0–17.0)
Immature Granulocytes: 1 %
Lymphocytes Relative: 9 %
Lymphs Abs: 1.5 10*3/uL (ref 0.7–4.0)
MCH: 29.7 pg (ref 26.0–34.0)
MCHC: 34.1 g/dL (ref 30.0–36.0)
MCV: 87.1 fL (ref 80.0–100.0)
Monocytes Absolute: 1.9 10*3/uL — ABNORMAL HIGH (ref 0.1–1.0)
Monocytes Relative: 11 %
Neutro Abs: 13.5 10*3/uL — ABNORMAL HIGH (ref 1.7–7.7)
Neutrophils Relative %: 78 %
Platelets: 413 10*3/uL — ABNORMAL HIGH (ref 150–400)
RBC: 5.59 MIL/uL (ref 4.22–5.81)
RDW: 12.8 % (ref 11.5–15.5)
WBC: 17.1 10*3/uL — ABNORMAL HIGH (ref 4.0–10.5)
nRBC: 0 % (ref 0.0–0.2)

## 2019-07-23 LAB — CBC
HCT: 48.2 % (ref 39.0–52.0)
Hemoglobin: 16.4 g/dL (ref 13.0–17.0)
MCH: 29.3 pg (ref 26.0–34.0)
MCHC: 34 g/dL (ref 30.0–36.0)
MCV: 86.2 fL (ref 80.0–100.0)
Platelets: 385 10*3/uL (ref 150–400)
RBC: 5.59 MIL/uL (ref 4.22–5.81)
RDW: 12.9 % (ref 11.5–15.5)
WBC: 17.7 10*3/uL — ABNORMAL HIGH (ref 4.0–10.5)
nRBC: 0 % (ref 0.0–0.2)

## 2019-07-23 LAB — HEMOGLOBIN A1C
Hgb A1c MFr Bld: 6.2 % — ABNORMAL HIGH (ref 4.8–5.6)
Mean Plasma Glucose: 131.24 mg/dL

## 2019-07-23 LAB — GLUCOSE, CAPILLARY
Glucose-Capillary: 130 mg/dL — ABNORMAL HIGH (ref 70–99)
Glucose-Capillary: 151 mg/dL — ABNORMAL HIGH (ref 70–99)

## 2019-07-23 LAB — LIPASE, BLOOD: Lipase: 22 U/L (ref 11–51)

## 2019-07-23 LAB — COMPREHENSIVE METABOLIC PANEL
ALT: 42 U/L (ref 0–44)
AST: 30 U/L (ref 15–41)
Albumin: 3.4 g/dL — ABNORMAL LOW (ref 3.5–5.0)
Alkaline Phosphatase: 103 U/L (ref 38–126)
Anion gap: 19 — ABNORMAL HIGH (ref 5–15)
BUN: 28 mg/dL — ABNORMAL HIGH (ref 8–23)
CO2: 24 mmol/L (ref 22–32)
Calcium: 10 mg/dL (ref 8.9–10.3)
Chloride: 95 mmol/L — ABNORMAL LOW (ref 98–111)
Creatinine, Ser: 1.65 mg/dL — ABNORMAL HIGH (ref 0.61–1.24)
GFR calc Af Amer: 47 mL/min — ABNORMAL LOW (ref >60)
GFR calc non Af Amer: 41 mL/min — ABNORMAL LOW (ref >60)
Glucose, Bld: 221 mg/dL — ABNORMAL HIGH (ref 70–99)
Potassium: 4 mmol/L (ref 3.5–5.1)
Sodium: 138 mmol/L (ref 135–145)
Total Bilirubin: 1.2 mg/dL (ref 0.3–1.2)
Total Protein: 7.5 g/dL (ref 6.5–8.1)

## 2019-07-23 LAB — SARS CORONAVIRUS 2 BY RT PCR (HOSPITAL ORDER, PERFORMED IN ~~LOC~~ HOSPITAL LAB): SARS Coronavirus 2: NEGATIVE

## 2019-07-23 LAB — CBG MONITORING, ED: Glucose-Capillary: 146 mg/dL — ABNORMAL HIGH (ref 70–99)

## 2019-07-23 MED ORDER — SODIUM CHLORIDE 0.9 % IV SOLN
INTRAVENOUS | Status: DC
  Administered 2019-07-23: 100 mL/h via INTRAVENOUS

## 2019-07-23 MED ORDER — MORPHINE SULFATE (PF) 2 MG/ML IV SOLN: 2.0000 mg | INTRAVENOUS | Status: DC | PRN

## 2019-07-23 MED ORDER — ACETAMINOPHEN 325 MG PO TABS: 650.0000 mg | ORAL_TABLET | Freq: Four times a day (QID) | ORAL | Status: DC | PRN

## 2019-07-23 MED ORDER — ACETAMINOPHEN 650 MG RE SUPP: 650.0000 mg | Freq: Four times a day (QID) | RECTAL | Status: DC | PRN

## 2019-07-23 MED ORDER — ENOXAPARIN SODIUM 40 MG/0.4ML ~~LOC~~ SOLN
40.0000 mg | SUBCUTANEOUS | Status: DC
  Administered 2019-07-24 – 2019-07-27 (×4): 40 mg via SUBCUTANEOUS
  Filled 2019-07-23 (×4): qty 0.4

## 2019-07-23 MED ORDER — LABETALOL HCL 5 MG/ML IV SOLN: 5.0000 mg | INTRAVENOUS | Status: DC | PRN

## 2019-07-23 MED ORDER — ONDANSETRON HCL 4 MG/2ML IJ SOLN
4.0000 mg | Freq: Four times a day (QID) | INTRAMUSCULAR | Status: DC | PRN
  Administered 2019-07-26: 4 mg via INTRAVENOUS
  Filled 2019-07-23 (×2): qty 2

## 2019-07-23 MED ORDER — IOHEXOL 300 MG/ML  SOLN
75.0000 mL | Freq: Once | INTRAMUSCULAR | Status: AC | PRN
  Administered 2019-07-23: 75 mL via INTRAVENOUS

## 2019-07-23 MED ORDER — LIDOCAINE VISCOUS HCL 2 % MT SOLN
15.0000 mL | Freq: Once | OROMUCOSAL | Status: AC
  Administered 2019-07-23: 15 mL via OROMUCOSAL
  Filled 2019-07-23: qty 15

## 2019-07-23 MED ORDER — PANTOPRAZOLE SODIUM 40 MG IV SOLR
40.0000 mg | Freq: Two times a day (BID) | INTRAVENOUS | Status: DC
  Administered 2019-07-23 – 2019-07-27 (×9): 40 mg via INTRAVENOUS
  Filled 2019-07-23 (×9): qty 40

## 2019-07-23 MED ORDER — LACTATED RINGERS IV BOLUS
1000.0000 mL | Freq: Once | INTRAVENOUS | Status: AC
  Administered 2019-07-23: 1000 mL via INTRAVENOUS

## 2019-07-23 MED ORDER — LORAZEPAM 2 MG/ML IJ SOLN: 0.2500 mg | Freq: Every evening | INTRAMUSCULAR | Status: DC | PRN

## 2019-07-23 MED ORDER — SODIUM CHLORIDE 0.9% FLUSH
3.0000 mL | Freq: Two times a day (BID) | INTRAVENOUS | Status: DC
  Administered 2019-07-23 – 2019-07-27 (×5): 3 mL via INTRAVENOUS

## 2019-07-23 MED ORDER — FAMOTIDINE IN NACL 20-0.9 MG/50ML-% IV SOLN
20.0000 mg | Freq: Once | INTRAVENOUS | Status: AC
  Administered 2019-07-23: 20 mg via INTRAVENOUS
  Filled 2019-07-23: qty 50

## 2019-07-23 MED ORDER — OXYMETAZOLINE HCL 0.05 % NA SOLN
1.0000 | Freq: Once | NASAL | Status: AC
  Administered 2019-07-23: 1 via NASAL
  Filled 2019-07-23: qty 30

## 2019-07-23 MED ORDER — INSULIN ASPART 100 UNIT/ML ~~LOC~~ SOLN
0.0000 [IU] | Freq: Four times a day (QID) | SUBCUTANEOUS | Status: DC
  Administered 2019-07-24 – 2019-07-26 (×2): 1 [IU] via SUBCUTANEOUS

## 2019-07-23 MED ORDER — SODIUM CHLORIDE 0.9% FLUSH: 3.0000 mL | Freq: Once | INTRAVENOUS | Status: DC

## 2019-07-23 MED ORDER — ALBUTEROL SULFATE (2.5 MG/3ML) 0.083% IN NEBU: 2.5000 mg | INHALATION_SOLUTION | Freq: Four times a day (QID) | RESPIRATORY_TRACT | Status: DC | PRN

## 2019-07-23 NOTE — ED Provider Notes (Addendum)
[facility] EMERGENCY DEPARTMENT Provider Note   CSN: [id number] Arrival date & time: 07/23/19  0944     History No chief complaint on file.   Roy [name] is a 74 y.o. male.  HPI    74 year old male with history of CAD, hypertension, hyperlipidemia and basal cell carcinoma of the legs comes in with chief complaint of nausea, vomiting.  Patient reports that over the last month or longer, he has had abdominal discomfort.  The abdominal discomfort is typically in the back and radiates to the front.  The pain has been constant, and improved over time.  Early on he was having 4 loose bowel movements, which have gotten better.  Patient continues to have lack of appetite.  He also has associated weight loss and reports that last night he had nausea with vomiting which was new.  He had emesis x3 and they were brown in color.  Patient was having dark tarry stools, but it was because he was taking Pepto-Bismol because of diarrhea.  Review of system is positive for night sweats.  Patient has not had a colonoscopy in his life.  He denies any history of bloody stools or any abdominal surgeries.  Past Medical History:  Diagnosis Date  . Basal cell carcinoma    Bilateral Legs  . CAD (coronary artery disease)    a.  stent to the RCA in 2001;  b.  Negative Myoview in January 2012;  c.  LHC 4/16:  dLM 20, small D1 50-70, D2 sub-total, oLCx 50-70, RCA stent ok, dPLB 90, EF 35-40% >> DES to D2  . Depression   . History of echocardiogram    a.  Echo 4/16: Mild LVH, EF 86-57%, grade 1 diastolic dysfunction, normal wall motion, MAC  . HLD (hyperlipidemia)   . HTN (hypertension)   . OSA (obstructive sleep apnea)   . Vertigo     Patient Active Problem List   Diagnosis Date Noted  . SBO (small bowel obstruction) ([place]) 07/23/2019  . Dark stools 07/01/2019  . Hypotension 07/01/2019  . Acute dehydration 07/01/2019  . Personal history of multiple sclerosis ([place]) 12/02/2018  .  Chronic fatigue 03/31/2017  . Diarrhea 03/31/2017  . Diabetes mellitus without complication ([place]) 84/69/6295  . Acute constipation 05/25/2015  . Internal hemorrhoids with complication 28/41/3244  . CAD, RCA PCI 2001, urgent Dx2 DES 04/24/14 04/24/2014  . Unstable angina with ST elelvation and NSVT while on treadmill 04/24/14   . History of basal cell carcinoma 03/07/2014  . GERD (gastroesophageal reflux disease) 03/07/2014  . Incomplete emptying of bladder 03/07/2014  . Obstructive sleep apnea 08/20/2009  . Dyslipidemia 11/09/2008  . Depression, major, recurrent ([place]) 07/06/2007  . Essential hypertension 07/06/2007  . Coronary atherosclerosis-residual PDA, LAD, CFX disease 07/06/2007    Past Surgical History:  Procedure Laterality Date  . CATARACT EXTRACTION, BILATERAL    . CORONARY STENT PLACEMENT  2001    RCA  . LEFT HEART CATHETERIZATION WITH CORONARY ANGIOGRAM N/A 02/13/2012   Procedure: LEFT HEART CATHETERIZATION WITH CORONARY ANGIOGRAM;  Surgeon: [name], MD;  Location: [facility] C[facility] LAB;  Service: Cardiovascular;  Laterality: N/A;  . LEFT HEART CATHETERIZATION WITH CORONARY ANGIOGRAM N/A 04/24/2014   Procedure: LEFT HEART CATHETERIZATION WITH CORONARY ANGIOGRAM;  Surgeon: [name], MD;  Location: [facility] CATH LAB;  Service: Cardiovascular;  Laterality: N/A;  . TONSILLECTOMY         Family History  Problem Relation Age of Onset  . Cirrhosis Mother   .  Alcohol abuse Mother   . Heart disease Father   . Hypertension Father   . Peripheral vascular disease Father   . Depression Sister   . Heart attack Neg Hx   . Stroke Neg Hx     Social History   Tobacco Use  . Smoking status: Former Smoker    Packs/day: 1.00    Years: 25.00    Pack years: 25.00    Types: Cigarettes    Quit date: 01/14/2000    Years since quitting: 19.5  . Smokeless tobacco: Never Used  Vaping Use  . Vaping Use: Never used  Substance Use Topics  . Alcohol use: Yes    Alcohol/week: 3.0 standard  drinks    Types: 2 Shots of liquor, 1 Cans of beer per week  . Drug use: No    Home Medications Prior to Admission medications   Medication Sig Start Date End Date Taking? Authorizing Provider  ALPRAZolam Duanne Moron) 0.25 MG tablet Take 0.25 mg by mouth at bedtime as needed for sleep.   Yes [provider]  atorvastatin (LIPITOR) 40 MG tablet TAKE 1 TABLET (40 MG TOTAL) BY MOUTH AT BEDTIME. PLEASE KEEP UPCOMING APPT WITH DR. Johnsie Cancel IN AUGUST FOR FUTURE REFILLS. [place] YOU Patient taking differently: Take 40 mg by mouth in the morning.  09/27/18  Yes Josue Hector, MD  carvedilol (COREG) 12.5 MG tablet TAKE 1 TABLET (12.5 MG TOTAL) BY MOUTH 2 (TWO) TIMES DAILY. Patient taking differently: Take 12.5 mg by mouth 2 (two) times daily with a meal.  11/02/18  Yes Josue Hector, MD  clopidogrel (PLAVIX) 75 MG tablet TAKE 1 TABLET BY MOUTH EVERY DAY Patient taking differently: Take 75 mg by mouth in the morning.  10/12/18  Yes Josue Hector, MD  Glucosamine HCl (GLUCOSAMINE PO) Take 2 tablets by mouth daily.   Yes [provider]  isosorbide mononitrate (IMDUR) 60 MG 24 hr tablet Take 1 tablet (60 mg total) by mouth daily. 09/14/18  Yes Josue Hector, MD  lisinopril (ZESTRIL) 5 MG tablet Take 1 tablet (5 mg total) by mouth daily. 09/14/18  Yes Josue Hector, MD  Magnesium Oxide 200 MG TABS Take 200 mg by mouth daily.   Yes [provider]  nitroGLYCERIN (NITROSTAT) 0.4 MG SL tablet PLACE 1 TABLET UNDER THE TONGUE EVERY 5 MINUTES AS NEEDED FOR CHEST PAIN. MAX OF 3 DOSES Patient taking differently: Place 0.4 mg under the tongue every 5 (five) minutes x 3 doses as needed for chest pain.  03/23/17  Yes Josue Hector, MD  omeprazole (PRILOSEC) 20 MG capsule Take 1 capsule (20 mg total) by mouth daily. Patient taking differently: Take 20 mg by mouth daily before breakfast.  12/21/18  Yes Josue Hector, MD  ondansetron (ZOFRAN) 4 MG tablet Take 1 tablet (4 mg total) by mouth every  8 (eight) hours as needed for nausea or vomiting. 07/01/19  Yes [name], [name], MD  venlafaxine XR (EFFEXOR-XR) 75 MG 24 hr capsule TAKE 1 CAPSULE (75 MG TOTAL) BY MOUTH DAILY WITH BREAKFAST. 06/20/19  Yes [name], [name], MD    Allergies    Patient has no known allergies.  Review of Systems   Review of Systems  Constitutional: Positive for activity change and fatigue.  Respiratory: Negative for shortness of breath.   Cardiovascular: Negative for chest pain.  Gastrointestinal: Positive for anal bleeding, nausea and vomiting.  Genitourinary: Negative for dysuria.  Neurological: Positive for dizziness.  All other systems  reviewed and are negative.   Physical Exam Updated Vital Signs BP 113/89 (BP Location: Right Arm)   Pulse 98   Temp 98 F (36.7 C) (Oral)   Resp 18   SpO2 100%   Physical Exam Vitals and nursing note reviewed.  Constitutional:      Appearance: He is well-developed.  HENT:     Head: Atraumatic.  Eyes:     Extraocular Movements: Extraocular movements intact.     Pupils: Pupils are equal, round, and reactive to light.  Cardiovascular:     Rate and Rhythm: Tachycardia present.  Pulmonary:     Effort: Pulmonary effort is normal.  Abdominal:     General: There is no distension.     Tenderness: There is abdominal tenderness. There is no guarding or rebound.     Comments: Generalized tenderness of the abdomen  Musculoskeletal:     Cervical back: Neck supple.  Skin:    General: Skin is warm.     Coloration: Skin is not jaundiced.  Neurological:     Mental Status: He is alert and oriented to person, place, and time.     ED Results / Procedures / Treatments   Labs (all labs ordered are listed, but only abnormal results are displayed) Labs Reviewed  COMPREHENSIVE METABOLIC PANEL - Abnormal; Notable for the following components:      Result Value   Chloride 95 (*)    Glucose, Bld 221 (*)    BUN 28 (*)    Creatinine, Ser 1.65 (*)    Albumin 3.4 (*)    GFR  calc non Af Amer 41 (*)    GFR calc Af Amer 47 (*)    Anion gap 19 (*)    All other components within normal limits  CBC - Abnormal; Notable for the following components:   WBC 17.7 (*)    All other components within normal limits  CBC WITH DIFFERENTIAL/PLATELET - Abnormal; Notable for the following components:   WBC 17.1 (*)    Platelets 413 (*)    Neutro Abs 13.5 (*)    Monocytes Absolute 1.9 (*)    Abs Immature Granulocytes 0.08 (*)    All other components within normal limits  HEMOGLOBIN A1C - Abnormal; Notable for the following components:   Hgb A1c MFr Bld 6.2 (*)    All other components within normal limits  SARS CORONAVIRUS 2 BY RT PCR (HOSPITAL ORDER, [place] [facility])  SARS CORONAVIRUS 2 BY RT PCR (HOSPITAL ORDER, [place] [facility])  LIPASE, BLOOD  URINALYSIS, ROUTINE W REFLEX MICROSCOPIC    EKG None  Radiology CT ABDOMEN PELVIS W CONTRAST  Result Date: 07/23/2019 CLINICAL DATA:  Abdominal distension and back pain.  Bloody stools. EXAM: CT ABDOMEN AND PELVIS WITH CONTRAST TECHNIQUE: Multidetector CT imaging of the abdomen and pelvis was performed using the standard protocol following bolus administration of intravenous contrast. CONTRAST:  21m OMNIPAQUE IOHEXOL 300 MG/ML  SOLN COMPARISON:  None. FINDINGS: Lower chest: Streaky right basilar scarring changes are noted. There is a calcified granuloma noted at the left lung base. No worrisome pulmonary lesions or pleural effusion. The heart is normal in size. No pericardial effusion. Coronary artery calcifications are noted. The distal esophagus is grossly normal. Hepatobiliary: Very small low-attenuation lesion at the hepatic dome is likely a benign cyst. No worrisome hepatic lesions are identified. The gallbladder is unremarkable. No common bile duct dilatation. Pancreas: No mass, inflammation or ductal dilatation. Spleen: Normal size.  A few small scattered calcified granulomas are noted.  Adrenals/Urinary Tract: The adrenal glands are normal. No renal lesions or hydronephrosis.  Bladder is unremarkable. Stomach/Bowel: The stomach is mildly distended with fluid and air. The small bowel is dilated and demonstrates scattered air-fluid levels. The colon is also dilated with fluid and air down to an obstructing enhancing lesion in the upper sigmoid colon near the sigmoid colon descending colon junction region. This measures approximately 5 cm and is highly suspicious for colonic neoplasm causing low colonic obstruction. Recommend surgical consultation. Vascular/Lymphatic: Advanced atherosclerotic calcifications involving the aorta and iliac arteries. No aneurysm. The branch vessels are patent. The major venous structures are patent. There are small scattered subcentimeter retroperitoneal lymph nodes. No mass or overt adenopathy. No pelvic adenopathy. I do not see any enlarged lymph nodes in the sigmoid mesocolon or in the pelvis to suggest locoregional adenopathy. Reproductive: The prostate gland and seminal vesicles are unremarkable. Other: No free pelvic fluid collections. No inguinal mass or adenopathy. Musculoskeletal: No significant bony findings. IMPRESSION: 1. 5 cm obstructing enhancing lesion in the upper sigmoid colon near the sigmoid colon descending colon junction region worrisome for colonic neoplasm causing low colonic obstruction. Fairly dilated colon and small bowel above the lesion. Recommend surgical consultation. 2. No findings for locoregional adenopathy or distant metastatic disease. 3. Advanced atherosclerotic calcifications involving the aorta and iliac arteries. 4. Aortic atherosclerosis. Aortic Atherosclerosis (ICD10-I70.0). Electronically Signed   By: Marijo Sanes M.D.   On: 07/23/2019 13:47   DG Chest Port 1 View  Result Date: 07/23/2019 CLINICAL DATA:  Patient arrived by [facility] complaining of lower back pain radiating through to abdomen x 1 month. States that he has had  increased gas and dark stools for several weeks. Has just developed nausea and vomiting x 1 day. Alert EXAM: PORTABLE CHEST - 1 VIEW COMPARISON:  02/10/2012 FINDINGS: Linear scarring or subsegmental atelectasis in the right mid lung. Stable calcified granuloma, left lower lung. No evidence of edema. Heart size and mediastinal contours are within normal limits. No effusion.  No pneumothorax. Visualized bones unremarkable. IMPRESSION: New linear scarring or atelectasis, right mid lung. Otherwise negative. Electronically Signed   By: Lucrezia Europe M.D.   On: 07/23/2019 12:21    Procedures Procedures (including critical care time)  Medications Ordered in ED Medications  sodium chloride flush (NS) 0.9 % injection 3 mL (has no administration in time range)  ondansetron (ZOFRAN) injection 4 mg (0 mg Intravenous Hold 07/23/19 1211)  enoxaparin (LOVENOX) injection 40 mg (has no administration in time range)  sodium chloride flush (NS) 0.9 % injection 3 mL (has no administration in time range)  acetaminophen (TYLENOL) tablet 650 mg (has no administration in time range)    Or  acetaminophen (TYLENOL) suppository 650 mg (has no administration in time range)  albuterol (PROVENTIL) (2.5 MG/3ML) 0.083% nebulizer solution 2.5 mg (has no administration in time range)  insulin aspart (novoLOG) injection 0-6 Units (has no administration in time range)  0.9 %  sodium chloride infusion (has no administration in time range)  oxymetazoline (AFRIN) 0.05 % nasal spray 1 spray (has no administration in time range)  famotidine (PEPCID) IVPB 20 mg premix (has no administration in time range)  lidocaine (XYLOCAINE) 2 % viscous mouth solution 15 mL (has no administration in time range)  lactated ringers bolus 1,000 mL (1,000 mLs Intravenous New Bag/Given 07/23/19 1214)  iohexol (OMNIPAQUE) 300 MG/ML solution 75 mL (75 mLs Intravenous Contrast Given 07/23/19 1309)    ED Course  I have reviewed the triage vital signs and the  nursing notes.  Pertinent labs & imaging results that were available during my care of the patient were reviewed by me and considered in my medical decision making (see chart for details).  Clinical Course as of Jul 23 1526  Sat Jul 23, 2019  1527 CT scan findings concerning for tumor.  I discussed the findings with the patient and the surgeon.  Dr. Dema Severin recommends the patient be admitted to medicine for further diagnostic work-up of the tumor.  They will see the patient as well.  CT ABDOMEN PELVIS W CONTRAST [AN]    Clinical Course User Index [AN] Varney Biles, MD   MDM Rules/Calculators/A&P                          74 year old comes in a chief complaint of ongoing abdominal pain, anorexia, irregular bowel movements, generalized weakness with new episode of nausea and vomiting.  Patient has seen his PCP.  He was noted to be hypotensive on couple of his visits and his blood pressure medication was discontinued.  He has a GI follow-up coming up.  My suspicion for an acute process is low.  I think he will need a CT scan to assess for any signs of malignancy.  Ultimately he will need to see the GI as planned and perhaps get scoped.  Ileus versus small bowel obstruction also considered in the differential given the irregular BM, loose watery BM and nausea and vomiting with early satiety.  Final Clinical Impression(s) / ED Diagnoses Final diagnoses:  SBO (small bowel obstruction) [facility])    Rx / DC Orders ED Discharge Orders    None       Varney Biles, MD 07/23/19 1257    Varney Biles, MD 07/23/19 1528

## 2019-07-23 NOTE — H&P (Signed)
History and Physical    [name] ION:[id number] DOB: 1945-09-29 DOA: 07/23/2019  Referring MD/NP/PA: Varney Biles, MD PCP: Jinny Sanders, MD  Patient coming from: home   Chief Complaint:   Abdominal pain  I have personally briefly reviewed patient's old medical records in [place]   HPI: Roy [name] is a 74 y.o. male with medical history significant of hypertension, hyperlipidemia, CAD s/p stents, diabetes mellitus type 2, diastolic dysfunction, and depression presents with complaints of abdominal pain.  Patient reports symptoms have been present for over 1 month now.  He describes pain as a "rolling" generalized crampy and bloated feeling that waxes and wanes in intensity.  Symptoms temporary alleviated with and able to pass flatus and thinks that trying to eat food makes symptoms worse.  His last bowel movement was sometime last night, but has been able to pass some flatus.  Since yesterday he developed nausea and vomiting and has been unable to keep any food or liquids down.  Noted associated symptoms of cold sweats last night, belching, and unintentional weight loss of approximately 15 pounds over the last couple months.  He has never had a colonoscopy before and is on Plavix for history of coronary artery disease.  He had been working with his primary care provider to evaluate the cause of symptoms, and was checked with stool guaiacs which were noted to be negative.  He denies any family history of colon cancer, but reports that his sister had to have part of her colon removed due to opioid abuse.  ED Course: Upon admission to the emergency department patient was seen to be afebrile with heart rates elevated to 125, and all other vital signs relatively maintained.  Labs significant for WBC 17.1, platelets 04/17/2011, BUN 28, creatinine 1.65, and glucose 221.  CT scan of the abdomen pelvis with contrast showed a 5 cm obstructing lesion in the upper sigmoid colon with  concern for small bowel obstruction.  General surgery was consulted.  Patient was given 1 L of lactated Ringer's and Zofran.  Review of Systems  Constitutional: Positive for diaphoresis, malaise/fatigue and weight loss. Negative for fever.  HENT: Negative for congestion and sinus pain.   Eyes: Negative for double vision and photophobia.  Respiratory: Negative for cough.   Cardiovascular: Negative for chest pain and leg swelling.  Gastrointestinal: Positive for abdominal pain, heartburn, nausea and vomiting. Negative for blood in stool.  Genitourinary: Negative for dysuria and hematuria.  Musculoskeletal: Negative for joint pain and myalgias.  Neurological: Positive for weakness. Negative for focal weakness and loss of consciousness.  Psychiatric/Behavioral: Negative for memory loss and substance abuse.    Past Medical History:  Diagnosis Date  . Basal cell carcinoma    Bilateral Legs  . CAD (coronary artery disease)    a.  stent to the RCA in 2001;  b.  Negative Myoview in January 2012;  c.  LHC 4/16:  dLM 20, small D1 50-70, D2 sub-total, oLCx 50-70, RCA stent ok, dPLB 90, EF 35-40% >> DES to D2  . Depression   . History of echocardiogram    a.  Echo 4/16: Mild LVH, EF 24-40%, grade 1 diastolic dysfunction, normal wall motion, MAC  . HLD (hyperlipidemia)   . HTN (hypertension)   . OSA (obstructive sleep apnea)   . Vertigo     Past Surgical History:  Procedure Laterality Date  . CATARACT EXTRACTION, BILATERAL    . CORONARY STENT PLACEMENT  2001  RCA  . LEFT HEART CATHETERIZATION WITH CORONARY ANGIOGRAM N/A 02/13/2012   Procedure: LEFT HEART CATHETERIZATION WITH CORONARY ANGIOGRAM;  Surgeon: [name], MD;  Location: [facility] CATH LAB;  Service: Cardiovascular;  Laterality: N/A;  . LEFT HEART CATHETERIZATION WITH CORONARY ANGIOGRAM N/A 04/24/2014   Procedure: LEFT HEART CATHETERIZATION WITH CORONARY ANGIOGRAM;  Surgeon: [name], MD;  Location: [facility] CATH LAB;  Service:  Cardiovascular;  Laterality: N/A;  . TONSILLECTOMY       reports that he quit smoking about 19 years ago. His smoking use included cigarettes. He has a 25.00 pack-year smoking history. He has never used smokeless tobacco. He reports current alcohol use of about 3.0 standard drinks of alcohol per week. He reports that he does not use drugs.  No Known Allergies  Family History  Problem Relation Age of Onset  . Cirrhosis Mother   . Alcohol abuse Mother   . Heart disease Father   . Hypertension Father   . Peripheral vascular disease Father   . Depression Sister   . Heart attack Neg Hx   . Stroke Neg Hx     Prior to Admission medications   Medication Sig Start Date End Date Taking? Authorizing Provider  atorvastatin (LIPITOR) 40 MG tablet TAKE 1 TABLET (40 MG TOTAL) BY MOUTH AT BEDTIME. PLEASE KEEP UPCOMING APPT WITH DR. Johnsie Cancel IN AUGUST FOR FUTURE REFILLS. [place] YOU Patient taking differently: Take 40 mg by mouth in the morning.  09/27/18  Yes Josue Hector, MD  carvedilol (COREG) 12.5 MG tablet TAKE 1 TABLET (12.5 MG TOTAL) BY MOUTH 2 (TWO) TIMES DAILY. Patient taking differently: Take 12.5 mg by mouth 2 (two) times daily with a meal.  11/02/18  Yes Josue Hector, MD  clopidogrel (PLAVIX) 75 MG tablet TAKE 1 TABLET BY MOUTH EVERY DAY Patient taking differently: Take 75 mg by mouth in the morning.  10/12/18  Yes Josue Hector, MD  lisinopril (ZESTRIL) 5 MG tablet Take 1 tablet (5 mg total) by mouth daily. 09/14/18  Yes Josue Hector, MD  omeprazole (PRILOSEC) 20 MG capsule Take 1 capsule (20 mg total) by mouth daily. Patient taking differently: Take 20 mg by mouth daily before breakfast.  12/21/18  Yes Josue Hector, MD  venlafaxine XR (EFFEXOR-XR) 75 MG 24 hr capsule TAKE 1 CAPSULE (75 MG TOTAL) BY MOUTH DAILY WITH BREAKFAST. 06/20/19  Yes [name], [name], MD  ALPRAZolam (XANAX) 0.25 MG tablet Take 0.25 mg by mouth at bedtime as needed for sleep.    [provider]    isosorbide mononitrate (IMDUR) 60 MG 24 hr tablet Take 1 tablet (60 mg total) by mouth daily. 09/14/18   Josue Hector, MD  Magnesium Oxide 200 MG TABS Take 200 mg by mouth daily.    [provider]  nitroGLYCERIN (NITROSTAT) 0.4 MG SL tablet PLACE 1 TABLET UNDER THE TONGUE EVERY 5 MINUTES AS NEEDED FOR CHEST PAIN. MAX OF 3 DOSES Patient taking differently: Place 0.4 mg under the tongue every 5 (five) minutes x 3 doses as needed for chest pain.  03/23/17   Josue Hector, MD  ondansetron (ZOFRAN) 4 MG tablet Take 1 tablet (4 mg total) by mouth every 8 (eight) hours as needed for nausea or vomiting. 07/01/19   Jinny Sanders, MD    Physical Exam:  Constitutional: Elderly male currently in no acute distress Vitals:   07/23/19 0948 07/23/19 1459 07/23/19 1611  BP: (!) 118/101 113/89   Pulse: Marland Kitchen)  125 98   Resp: 18 18   Temp: 98.1 F (36.7 C) 98 F (36.7 C) 98 F (36.7 C)  TempSrc: Oral Oral   SpO2: 99% 100%    Eyes: PERRL, lids and conjunctivae normal ENMT: Mucous membranes are moist. Posterior pharynx clear of any exudate or lesions. NGT to suction with bile appearing fluid present Neck: normal, supple, no masses, no thyromegaly Respiratory: clear to auscultation bilaterally, no wheezing, no crackles. Normal respiratory effort. No accessory muscle use.  Cardiovascular: Regular rate and rhythm, no murmurs / rubs / gallops. No extremity edema. 2+ pedal pulses. No carotid bruits.  Abdomen: No abdominal tenderness appreciated at this time with decreased bowel sounds Musculoskeletal: no clubbing / cyanosis. No joint deformity upper and lower extremities. Good ROM, no contractures. Normal muscle tone.  Skin: no rashes, lesions, ulcers. No induration Neurologic: CN 2-12 grossly intact. Sensation intact, DTR normal. Strength 5/5 in all 4.  Psychiatric: Normal judgment and insight. Alert and oriented x 3. Normal mood.     Labs on Admission: I have personally reviewed following labs  and imaging studies  CBC: Recent Labs  Lab 07/23/19 0957  WBC 17.1*  17.7*  NEUTROABS 13.5*  HGB 16.6  16.4  HCT 48.7  48.2  MCV 87.1  86.2  PLT 413*  176   Basic Metabolic Panel: Recent Labs  Lab 07/23/19 0957  NA 138  K 4.0  CL 95*  CO2 24  GLUCOSE 221*  BUN 28*  CREATININE 1.65*  CALCIUM 10.0   GFR: Estimated Creatinine Clearance: 39.3 mL/min (A) (by C-G formula based on SCr of 1.65 mg/dL (H)). Liver Function Tests: Recent Labs  Lab 07/23/19 0957  AST 30  ALT 42  ALKPHOS 103  BILITOT 1.2  PROT 7.5  ALBUMIN 3.4*   Recent Labs  Lab 07/23/19 0957  LIPASE 22   No results for input(s): AMMONIA in the last 168 hours. Coagulation Profile: No results for input(s): INR, PROTIME in the last 168 hours. Cardiac Enzymes: No results for input(s): CKTOTAL, CKMB, CKMBINDEX, TROPONINI in the last 168 hours. BNP (last 3 results) No results for input(s): PROBNP in the last 8760 hours. HbA1C: Recent Labs    07/23/19 0957  HGBA1C 6.2*   CBG: Recent Labs  Lab 07/23/19 1541  GLUCAP 146*   Lipid Profile: No results for input(s): CHOL, HDL, LDLCALC, TRIG, CHOLHDL, LDLDIRECT in the last 72 hours. Thyroid Function Tests: No results for input(s): TSH, T4TOTAL, FREET4, T3FREE, THYROIDAB in the last 72 hours. Anemia Panel: No results for input(s): VITAMINB12, FOLATE, FERRITIN, TIBC, IRON, RETICCTPCT in the last 72 hours. Urine analysis:    Component Value Date/Time   COLORURINE LT YELLOW 12/08/2005 0741   LABSPEC 1.025 12/08/2005 0741   PHURINE 6.0 12/08/2005 0741   GLUCOSEU NEGATIVE 12/08/2005 0741   BILIRUBINUR NEGATIVE 12/08/2005 0741   KETONESUR NEGATIVE 12/08/2005 0741   UROBILINOGEN 0.2 mg/dL 12/08/2005 0741   NITRITE Negative 12/08/2005 0741   LEUKOCYTESUR Negative 12/08/2005 0741   Sepsis Labs: No results found for this or any previous visit (from the past 240 hour(s)).   Radiological Exams on Admission: CT ABDOMEN PELVIS W CONTRAST  Result  Date: 07/23/2019 CLINICAL DATA:  Abdominal distension and back pain.  Bloody stools. EXAM: CT ABDOMEN AND PELVIS WITH CONTRAST TECHNIQUE: Multidetector CT imaging of the abdomen and pelvis was performed using the standard protocol following bolus administration of intravenous contrast. CONTRAST:  85m OMNIPAQUE IOHEXOL 300 MG/ML  SOLN COMPARISON:  None. FINDINGS: Lower chest: Streaky  right basilar scarring changes are noted. There is a calcified granuloma noted at the left lung base. No worrisome pulmonary lesions or pleural effusion. The heart is normal in size. No pericardial effusion. Coronary artery calcifications are noted. The distal esophagus is grossly normal. Hepatobiliary: Very small low-attenuation lesion at the hepatic dome is likely a benign cyst. No worrisome hepatic lesions are identified. The gallbladder is unremarkable. No common bile duct dilatation. Pancreas: No mass, inflammation or ductal dilatation. Spleen: Normal size. A few small scattered calcified granulomas are noted. Adrenals/Urinary Tract: The adrenal glands are normal. No renal lesions or hydronephrosis.  Bladder is unremarkable. Stomach/Bowel: The stomach is mildly distended with fluid and air. The small bowel is dilated and demonstrates scattered air-fluid levels. The colon is also dilated with fluid and air down to an obstructing enhancing lesion in the upper sigmoid colon near the sigmoid colon descending colon junction region. This measures approximately 5 cm and is highly suspicious for colonic neoplasm causing low colonic obstruction. Recommend surgical consultation. Vascular/Lymphatic: Advanced atherosclerotic calcifications involving the aorta and iliac arteries. No aneurysm. The branch vessels are patent. The major venous structures are patent. There are small scattered subcentimeter retroperitoneal lymph nodes. No mass or overt adenopathy. No pelvic adenopathy. I do not see any enlarged lymph nodes in the sigmoid mesocolon or  in the pelvis to suggest locoregional adenopathy. Reproductive: The prostate gland and seminal vesicles are unremarkable. Other: No free pelvic fluid collections. No inguinal mass or adenopathy. Musculoskeletal: No significant bony findings. IMPRESSION: 1. 5 cm obstructing enhancing lesion in the upper sigmoid colon near the sigmoid colon descending colon junction region worrisome for colonic neoplasm causing low colonic obstruction. Fairly dilated colon and small bowel above the lesion. Recommend surgical consultation. 2. No findings for locoregional adenopathy or distant metastatic disease. 3. Advanced atherosclerotic calcifications involving the aorta and iliac arteries. 4. Aortic atherosclerosis. Aortic Atherosclerosis (ICD10-I70.0). Electronically Signed   By: Marijo Sanes M.D.   On: 07/23/2019 13:47   DG Chest Port 1 View  Result Date: 07/23/2019 CLINICAL DATA:  Patient arrived by [facility] complaining of lower back pain radiating through to abdomen x 1 month. States that he has had increased gas and dark stools for several weeks. Has just developed nausea and vomiting x 1 day. Alert EXAM: PORTABLE CHEST - 1 VIEW COMPARISON:  02/10/2012 FINDINGS: Linear scarring or subsegmental atelectasis in the right mid lung. Stable calcified granuloma, left lower lung. No evidence of edema. Heart size and mediastinal contours are within normal limits. No effusion.  No pneumothorax. Visualized bones unremarkable. IMPRESSION: New linear scarring or atelectasis, right mid lung. Otherwise negative. Electronically Signed   By: Lucrezia Europe M.D.   On: 07/23/2019 12:21    EKG: Independently reviewed.  Sinus rhythm at 95 bpm  Assessment/Plan Small bowel obstruction secondary to colonic mass: Acute.  Patient present with nausea and vomiting over the last day and complaints of abdominal pain for the last month.  CT scan revealed signs of a subcentimeter obstructing sigmoid lesion malignancy.  Patient never previously had a  colonoscopy.  General surgery was formally consulted. -Admit to a medical telemetry bed -Monitor intake and output -N.p.o. -Continue NGT to suction -Normal saline IV fluids at 100 mL/h  -Check CT scan of the chest with contrast for screening  -Appreciate general surgery consultative services, will follow-up for further recommendations -Consider need to consult gastroenterology based on findings of CT scan  Acute kidney injury disease superimposed on chronic kidney disease IIIa: Patient's baseline  creatinine previously noted to be around 1.25, patient presents with creatinine elevated up to 1.65 with BUN 28.  In the setting of decreased p.o. intake and nausea and vomiting suspect prerenal cause.  Patient had been given 1 L of lactated Ringer's in the ED. -Continue IV fluids as seen above -Recheck kidney function in a.m.  Essential hypertension: At home patient on Coreg 12.5 mg twice daily, isosorbide mononitrate 60 mg daily, and Lisinopril 5 mg daily. -Labetalol IV as needed for elevated blood pressure  Coronary artery disease: Patient with prior history of stents.  Home medications include Plavix. -Plavix currently on hold last NPO  Diabetes mellitus type 2 with hyperglycemia: On admission glucose elevated up to 221.  Last hemoglobin A1c was 6.1 on 11/08/2018.  Patient does not appear to be on any diabetic medication. -Hypoglycemic protocol -Check hemoglobin A1c -CBGs every 6 hours with very sensitive SSI  Insomnia: Patient normally on Xanax 0.25 mg nightly for sleep. -Ativan 0.25 mg IV as needed for sleep  GI prophylaxis -Protonix IV DVT prophylaxis: Lovenox Code Status: Full Disposition Plan: Possible discharge home depending plan for area of obstructing bowel Consults called: General surgery Admission status: Inpatient  Norval Morton MD [facility] Pager [phone]   If 7PM-7AM, please contact night-coverage [email or web address] Password [facility]  07/23/2019, 4:17 PM

## 2019-07-23 NOTE — Consult Note (Signed)
CC/Reason of consult: Partially obstructing sigmoid mass  Requesting provider: Dr. Kathrynn Humble  HPI: Roy [name] is an 74 y.o. male with hx of HTN, HLD, ?DM, CAD (s/p stent x2, on plavix, last dose 07/22/19) who presents to the emergency department today with an at least 1 month history of crampy abdominal pain and bloating.  He has noted some distention associated with this.  He has been seen with his primary care for this reason and has been undergoing work-up including what was reported to be a negative fecal occult blood test.  He initially had some black stool but had attributed this to Pepto-Bismol.  He has been on Plavix and this was temporarily halted until he had a negative FOBT.  He has since been back on his Plavix.  He reports that the discomfort he has been experiencing is throughout his abdomen and more generalized crampy/achy bloating feeling that any sort of pain.  It waxes and wanes.  Nothing seems to make it worse.  Passing flatus will alleviate his discomfort.  He reports an unintentional 15 pound weight loss over the last 2 to 3 months.  Over the last day or so, he developed emesis 3 times.  For these reasons, he has not taken his medications today including his Plavix.  Last BM/flatus was yesterday.  His only other associated symptom is night sweats which she has been dealing with for a couple of months.  He has never had a colonoscopy  He denies any known family history of colon, rectal, breast, or ovarian cancer.  He does report that he has a sister whom had her entire colon removed due to a condition that he reports was caused by chronic opioid use  He does not smoke.  He reports social EtOH use.  He denies illicit drug use.  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 Basal cell carcinoma    Bilateral Legs  . CAD (coronary  artery disease)    a.  stent to the RCA in 2001;  b.  Negative Myoview in January 2012;  c.  LHC 4/16:  dLM 20, small D1 50-70, D2 sub-total, oLCx 50-70, RCA stent ok, dPLB 90, EF 35-40% >> DES to D2  . Depression   . History of echocardiogram    a.  Echo 4/16: Mild LVH, EF 01-31%, grade 1 diastolic dysfunction, normal wall motion, MAC  . HLD (hyperlipidemia)   . HTN (hypertension)   . OSA (obstructive sleep apnea)   . Vertigo     Past Surgical History:  Procedure Laterality Date  . CATARACT EXTRACTION, BILATERAL    . CORONARY STENT PLACEMENT  2001    RCA  . LEFT HEART CATHETERIZATION WITH CORONARY ANGIOGRAM N/A 02/13/2012   Procedure: LEFT HEART CATHETERIZATION WITH CORONARY ANGIOGRAM;  Surgeon: [name], MD;  Location: [facility] CATH LAB;  Service: Cardiovascular;  Laterality: N/A;  . LEFT HEART CATHETERIZATION WITH CORONARY ANGIOGRAM N/A 04/24/2014   Procedure: LEFT HEART CATHETERIZATION WITH CORONARY ANGIOGRAM;  Surgeon: [name], MD;  Location: [facility] CATH LAB;  Service: Cardiovascular;  Laterality: N/A;  . TONSILLECTOMY      Family History  Problem Relation Age of Onset  . Cirrhosis Mother   . Alcohol abuse Mother   . Heart disease Father   . Hypertension Father   . Peripheral vascular disease  Father   . Depression Sister   . Heart attack Neg Hx   . Stroke Neg Hx     Social:  reports that he quit smoking about 19 years ago. His smoking use included cigarettes. He has a 25.00 pack-year smoking history. He has never used smokeless tobacco. He reports current alcohol use of about 3.0 standard drinks of alcohol per week. He reports that he does not use drugs.  Allergies: No Known Allergies  Medications: I have reviewed the patient's current medications.  Results for orders placed or performed during the hospital encounter of 07/23/19 (from the past 48 hour(s))  Lipase, blood     Status: None   Collection Time: 07/23/19  9:57 AM  Result Value Ref Range   Lipase 22 11 -  51 U/L    Comment: Performed at [facility], [address] [place], [place] [address]  Comprehensive metabolic panel     Status: Abnormal   Collection Time: 07/23/19  9:57 AM  Result Value Ref Range   Sodium 138 135 - 145 mmol/L   Potassium 4.0 3.5 - 5.1 mmol/L   Chloride 95 (L) 98 - 111 mmol/L   CO2 24 22 - 32 mmol/L   Glucose, Bld 221 (H) 70 - 99 mg/dL    Comment: Glucose reference range applies only to samples taken after fasting for at least 8 hours.   BUN 28 (H) 8 - 23 mg/dL   Creatinine, Ser 1.65 (H) 0.61 - 1.24 mg/dL   Calcium 10.0 8.9 - 10.3 mg/dL   Total Protein 7.5 6.5 - 8.1 g/dL   Albumin 3.4 (L) 3.5 - 5.0 g/dL   AST 30 15 - 41 U/L   ALT 42 0 - 44 U/L    Comment: RESULTS CONFIRMED BY MANUAL DILUTION   Alkaline Phosphatase 103 38 - 126 U/L   Total Bilirubin 1.2 0.3 - 1.2 mg/dL   GFR calc non Af Amer 41 (L) >60 mL/min   GFR calc Af Amer 47 (L) >60 mL/min   Anion gap 19 (H) 5 - 15    Comment: Performed at [facility] [address] [place], [place] [address]  CBC     Status: Abnormal   Collection Time: 07/23/19  9:57 AM  Result Value Ref Range   WBC 17.7 (H) 4.0 - 10.5 K/uL   RBC 5.59 4.22 - 5.81 MIL/uL   Hemoglobin 16.4 13.0 - 17.0 g/dL   HCT 48.2 39 - 52 %   MCV 86.2 80.0 - 100.0 fL   MCH 29.3 26.0 - 34.0 pg   MCHC 34.0 30.0 - 36.0 g/dL   RDW 12.9 11.5 - 15.5 %   Platelets 385 150 - 400 K/uL   nRBC 0.0 0.0 - 0.2 %    Comment: Performed at [facility], [place] [address], [place], [place] [address]  CBC with Differential/Platelet     Status: Abnormal   Collection Time: 07/23/19  9:57 AM  Result Value Ref Range   WBC 17.1 (H) 4.0 - 10.5 K/uL   RBC 5.59 4.22 - 5.81 MIL/uL   Hemoglobin 16.6 13.0 - 17.0 g/dL   HCT 48.7 39 - 52 %   MCV 87.1 80.0 - 100.0 fL   MCH 29.7 26.0 - 34.0 pg   MCHC 34.1 30.0 - 36.0 g/dL   RDW 12.8 11.5 - 15.5 %   Platelets 413 (H) 150 - 400 K/uL   nRBC 0.0 0.0 - 0.2 %  Neutrophils Relative % 78 %   Neutro Abs 13.5  (H) 1.7 - 7.7 K/uL   Lymphocytes Relative 9 %   Lymphs Abs 1.5 0.7 - 4.0 K/uL   Monocytes Relative 11 %   Monocytes Absolute 1.9 (H) 0 - 1 K/uL   Eosinophils Relative 1 %   Eosinophils Absolute 0.1 0 - 0 K/uL   Basophils Relative 0 %   Basophils Absolute 0.0 0 - 0 K/uL   Immature Granulocytes 1 %   Abs Immature Granulocytes 0.08 (H) 0.00 - 0.07 K/uL    Comment: Performed at [facility] [address] [place], [place] [address]    CT ABDOMEN PELVIS W CONTRAST  Result Date: 07/23/2019 CLINICAL DATA:  Abdominal distension and back pain.  Bloody stools. EXAM: CT ABDOMEN AND PELVIS WITH CONTRAST TECHNIQUE: Multidetector CT imaging of the abdomen and pelvis was performed using the standard protocol following bolus administration of intravenous contrast. CONTRAST:  52m OMNIPAQUE IOHEXOL 300 MG/ML  SOLN COMPARISON:  None. FINDINGS: Lower chest: Streaky right basilar scarring changes are noted. There is a calcified granuloma noted at the left lung base. No worrisome pulmonary lesions or pleural effusion. The heart is normal in size. No pericardial effusion. Coronary artery calcifications are noted. The distal esophagus is grossly normal. Hepatobiliary: Very small low-attenuation lesion at the hepatic dome is likely a benign cyst. No worrisome hepatic lesions are identified. The gallbladder is unremarkable. No common bile duct dilatation. Pancreas: No mass, inflammation or ductal dilatation. Spleen: Normal size. A few small scattered calcified granulomas are noted. Adrenals/Urinary Tract: The adrenal glands are normal. No renal lesions or hydronephrosis.  Bladder is unremarkable. Stomach/Bowel: The stomach is mildly distended with fluid and air. The small bowel is dilated and demonstrates scattered air-fluid levels. The colon is also dilated with fluid and air down to an obstructing enhancing lesion in the upper sigmoid colon near the sigmoid colon descending colon junction region. This measures  approximately 5 cm and is highly suspicious for colonic neoplasm causing low colonic obstruction. Recommend surgical consultation. Vascular/Lymphatic: Advanced atherosclerotic calcifications involving the aorta and iliac arteries. No aneurysm. The branch vessels are patent. The major venous structures are patent. There are small scattered subcentimeter retroperitoneal lymph nodes. No mass or overt adenopathy. No pelvic adenopathy. I do not see any enlarged lymph nodes in the sigmoid mesocolon or in the pelvis to suggest locoregional adenopathy. Reproductive: The prostate gland and seminal vesicles are unremarkable. Other: No free pelvic fluid collections. No inguinal mass or adenopathy. Musculoskeletal: No significant bony findings. IMPRESSION: 1. 5 cm obstructing enhancing lesion in the upper sigmoid colon near the sigmoid colon descending colon junction region worrisome for colonic neoplasm causing low colonic obstruction. Fairly dilated colon and small bowel above the lesion. Recommend surgical consultation. 2. No findings for locoregional adenopathy or distant metastatic disease. 3. Advanced atherosclerotic calcifications involving the aorta and iliac arteries. 4. Aortic atherosclerosis. Aortic Atherosclerosis (ICD10-I70.0). Electronically Signed   By: PMarijo SanesM.D.   On: 07/23/2019 13:47   DG Chest Port 1 View  Result Date: 07/23/2019 CLINICAL DATA:  [facility] abdomen x 1 month. States that he has had increased gas and dark stools for several weeks. Has just developed nausea and vomiting x 1 day. Alert EXAM: PORTABLE CHEST - 1 VIEW COMPARISON:  02/10/2012 FINDINGS: Linear scarring or subsegmental atelectasis in the right mid lung. Stable calcified granuloma, left lower lung. No evidence of edema.  Heart size and mediastinal contours are within normal limits. No effusion.  No pneumothorax. Visualized bones unremarkable. IMPRESSION: New  linear scarring or atelectasis, right mid lung. Otherwise negative. Electronically Signed   By: Lucrezia Europe M.D.   On: 07/23/2019 12:21    ROS - all of the below systems have been reviewed with the patient and positives are indicated with bold text General: chills, fever or night sweats Eyes: blurry vision or double vision ENT: epistaxis or sore throat Allergy/Immunology: itchy/watery eyes or nasal congestion Hematologic/Lymphatic: bleeding problems, blood clots or swollen lymph nodes Endocrine: temperature intolerance or unexpected weight changes Breast: new or changing breast lumps or nipple discharge Resp: cough, shortness of breath, or wheezing CV: chest pain or dyspnea on exertion GI: as per HPI GU: dysuria, trouble voiding, or hematuria MSK: joint pain or joint stiffness Neuro: TIA or stroke symptoms Derm: pruritus and skin lesion changes Psych: anxiety and depression  PE Blood pressure (!) 118/101, pulse (!) 125, temperature 98.1 F (36.7 C), temperature source Oral, resp. rate 18, SpO2 99 %. Constitutional: NAD; conversant; no deformities; wearing mask and glasses Eyes: Moist conjunctiva; no lid lag; anicteric; PERRL Neck: Trachea midline; no thyromegaly Lungs: Normal respiratory effort; no tactile fremitus CV: RRR; no palpable thrills; no pitting edema GI: Abd soft, moderately distended, nontender throughout. No rebound nor guarding. No palpable masses. No palpable hepatosplenomegaly MSK: Normal range of motion of extremities; no clubbing/cyanosis Psychiatric: Appropriate affect; alert and oriented x3 Lymphatic: No palpable cervical or axillary lymphadenopathy  Results for orders placed or performed during the hospital encounter of 07/23/19 (from the past 48 hour(s))  Lipase, blood     Status: None   Collection Time: 07/23/19  9:57 AM  Result Value Ref Range   Lipase 22 11 - 51 U/L    Comment: Performed at [facility], [address], [place], [place] [address]    Comprehensive metabolic panel     Status: Abnormal   Collection Time: 07/23/19  9:57 AM  Result Value Ref Range   Sodium 138 135 - 145 mmol/L   Potassium 4.0 3.5 - 5.1 mmol/L   Chloride 95 (L) 98 - 111 mmol/L   CO2 24 22 - 32 mmol/L   Glucose, Bld 221 (H) 70 - 99 mg/dL    Comment: Glucose reference range applies only to samples taken after fasting for at least 8 hours.   BUN 28 (H) 8 - 23 mg/dL   Creatinine, Ser 1.65 (H) 0.61 - 1.24 mg/dL   Calcium 10.0 8.9 - 10.3 mg/dL   Total Protein 7.5 6.5 - 8.1 g/dL   Albumin 3.4 (L) 3.5 - 5.0 g/dL   AST 30 15 - 41 U/L   ALT 42 0 - 44 U/L    Comment: RESULTS CONFIRMED BY MANUAL DILUTION   Alkaline Phosphatase 103 38 - 126 U/L   Total Bilirubin 1.2 0.3 - 1.2 mg/dL   GFR calc non Af Amer 41 (L) >60 mL/min   GFR calc Af Amer 47 (L) >60 mL/min   Anion gap 19 (H) 5 - 15    Comment: Performed at [facility] [address], [place] [address]  CBC     Status: Abnormal   Collection Time: 07/23/19  9:57 AM  Result Value Ref Range   WBC 17.7 (H) 4.0 - 10.5 K/uL   RBC 5.59 4.22 - 5.81 MIL/uL   Hemoglobin 16.4 13.0 - 17.0 g/dL   HCT 48.2 39 - 52 %  MCV 86.2 80.0 - 100.0 fL   MCH 29.3 26.0 - 34.0 pg   MCHC 34.0 30.0 - 36.0 g/dL   RDW 12.9 11.5 - 15.5 %   Platelets 385 150 - 400 K/uL   nRBC 0.0 0.0 - 0.2 %    Comment: Performed at [facility], [place] [address] [place], [place] [address]  CBC with Differential/Platelet     Status: Abnormal   Collection Time: 07/23/19  9:57 AM  Result Value Ref Range   WBC 17.1 (H) 4.0 - 10.5 K/uL   RBC 5.59 4.22 - 5.81 MIL/uL   Hemoglobin 16.6 13.0 - 17.0 g/dL   HCT 48.7 39 - 52 %   MCV 87.1 80.0 - 100.0 fL   MCH 29.7 26.0 - 34.0 pg   MCHC 34.1 30.0 - 36.0 g/dL   RDW 12.8 11.5 - 15.5 %   Platelets 413 (H) 150 - 400 K/uL   nRBC 0.0 0.0 - 0.2 %   Neutrophils Relative % 78 %   Neutro Abs 13.5 (H) 1.7 - 7.7 K/uL   Lymphocytes Relative 9 %   Lymphs Abs 1.5 0.7 - 4.0 K/uL   Monocytes  Relative 11 %   Monocytes Absolute 1.9 (H) 0 - 1 K/uL   Eosinophils Relative 1 %   Eosinophils Absolute 0.1 0 - 0 K/uL   Basophils Relative 0 %   Basophils Absolute 0.0 0 - 0 K/uL   Immature Granulocytes 1 %   Abs Immature Granulocytes 0.08 (H) 0.00 - 0.07 K/uL    Comment: Performed at [facility] [address], [place],  [address]    CT ABDOMEN PELVIS W CONTRAST  Result Date: 07/23/2019 CLINICAL DATA:  Abdominal distension and back pain.  Bloody stools. EXAM: CT ABDOMEN AND PELVIS WITH CONTRAST TECHNIQUE: Multidetector CT imaging of the abdomen and pelvis was performed using the standard protocol following bolus administration of intravenous contrast. CONTRAST:  63m OMNIPAQUE IOHEXOL 300 MG/ML  SOLN COMPARISON:  None. FINDINGS: Lower chest: Streaky right basilar scarring changes are noted. There is a calcified granuloma noted at the left lung base. No worrisome pulmonary lesions or pleural effusion. The heart is normal in size. No pericardial effusion. Coronary artery calcifications are noted. The distal esophagus is grossly normal. Hepatobiliary: Very small low-attenuation lesion at the hepatic dome is likely a benign cyst. No worrisome hepatic lesions are identified. The gallbladder is unremarkable. No common bile duct dilatation. Pancreas: No mass, inflammation or ductal dilatation. Spleen: Normal size. A few small scattered calcified granulomas are noted. Adrenals/Urinary Tract: The adrenal glands are normal. No renal lesions or hydronephrosis.  Bladder is unremarkable. Stomach/Bowel: The stomach is mildly distended with fluid and air. The small bowel is dilated and demonstrates scattered air-fluid levels. The colon is also dilated with fluid and air down to an obstructing enhancing lesion in the upper sigmoid colon near the sigmoid colon descending colon junction region. This measures approximately 5 cm and is highly suspicious for colonic neoplasm causing low colonic obstruction.  Recommend surgical consultation. Vascular/Lymphatic: Advanced atherosclerotic calcifications involving the aorta and iliac arteries. No aneurysm. The branch vessels are patent. The major venous structures are patent. There are small scattered subcentimeter retroperitoneal lymph nodes. No mass or overt adenopathy. No pelvic adenopathy. I do not see any enlarged lymph nodes in the sigmoid mesocolon or in the pelvis to suggest locoregional adenopathy. Reproductive: The prostate gland and seminal vesicles are unremarkable. Other: No free pelvic fluid collections. No inguinal mass  or adenopathy. Musculoskeletal: No significant bony findings. IMPRESSION: 1. 5 cm obstructing enhancing lesion in the upper sigmoid colon near the sigmoid colon descending colon junction region worrisome for colonic neoplasm causing low colonic obstruction. Fairly dilated colon and small bowel above the lesion. Recommend surgical consultation. 2. No findings for locoregional adenopathy or distant metastatic disease. 3. Advanced atherosclerotic calcifications involving the aorta and iliac arteries. 4. Aortic atherosclerosis. Aortic Atherosclerosis (ICD10-I70.0). Electronically Signed   By: Marijo Sanes M.D.   On: 07/23/2019 13:47   DG Chest Port 1 View  Result Date: 07/23/2019 CLINICAL DATA:  Patient arrived by [facility] complaining of lower back pain radiating through to abdomen x 1 month. States that he has had increased gas and dark stools for several weeks. Has just developed nausea and vomiting x 1 day. Alert EXAM: PORTABLE CHEST - 1 VIEW COMPARISON:  02/10/2012 FINDINGS: Linear scarring or subsegmental atelectasis in the right mid lung. Stable calcified granuloma, left lower lung. No evidence of edema. Heart size and mediastinal contours are within normal limits. No effusion.  No pneumothorax. Visualized bones unremarkable. IMPRESSION: New linear scarring or atelectasis, right mid lung. Otherwise negative. Electronically Signed   By: Lucrezia Europe M.D.   On: 07/23/2019 12:21   A/P: KHALED HERDA is an 74 y.o. male with HTN, HLD, CAD (on plavix, last dose 7/9), ?DM here with at least partially obstructing proximal sigmoid lesion/mass. Albumin 3.4  -Given symptoms, agree with NPO, NG tube -CEA level, A1c -IV resuscitation - appears somewhat contracted biochemically with hemoconcentration and AKI -GI consult for endoscopic evaluation and biopsy. May need gentle low volume enemas to clear rectum -Cardiology evaluation for cardiac clearance and ideally come off plavix if safe -In following days, may need CT chest with contrast but only after resuscitation and renal recovery - for staging purposes -If colon adenocarcinoma and no metastatic disease but persistent obstructive like sxs, could entertain stent placement for prep, complete scope and possibly resection/anastomosis +/- diverting loop ileostomy. If not stent candidate may need Hartmann's procedure as colon won't be able to be prepped and will have large column of stool proximally.  Sharon Mt. Dema Severin, M.D. [facility] Use [email or web address] to contact on call provider

## 2019-07-23 NOTE — ED Triage Notes (Addendum)
Patient arrived by [facility] complaining of lower back pain radiating through to abdomen x 1 month. States that he has had increased gas and dark stools for several weeks. Has just developed nausea and vomiting x 1 day. Alert and oriented, NAD. Skin color pale Decreased appetite x 2-3 days

## 2019-07-24 ENCOUNTER — Inpatient Hospital Stay: Payer: Self-pay

## 2019-07-24 ENCOUNTER — Inpatient Hospital Stay (HOSPITAL_COMMUNITY): Payer: Medicare HMO

## 2019-07-24 LAB — GLUCOSE, CAPILLARY
Glucose-Capillary: 105 mg/dL — ABNORMAL HIGH (ref 70–99)
Glucose-Capillary: 109 mg/dL — ABNORMAL HIGH (ref 70–99)
Glucose-Capillary: 123 mg/dL — ABNORMAL HIGH (ref 70–99)
Glucose-Capillary: 126 mg/dL — ABNORMAL HIGH (ref 70–99)
Glucose-Capillary: 134 mg/dL — ABNORMAL HIGH (ref 70–99)

## 2019-07-24 LAB — BASIC METABOLIC PANEL
Anion gap: 11 (ref 5–15)
BUN: 27 mg/dL — ABNORMAL HIGH (ref 8–23)
CO2: 29 mmol/L (ref 22–32)
Calcium: 9.2 mg/dL (ref 8.9–10.3)
Chloride: 99 mmol/L (ref 98–111)
Creatinine, Ser: 1.14 mg/dL (ref 0.61–1.24)
GFR calc Af Amer: >60 mL/min (ref >60)
GFR calc non Af Amer: >60 mL/min (ref >60)
Glucose, Bld: 151 mg/dL — ABNORMAL HIGH (ref 70–99)
Potassium: 4.5 mmol/L (ref 3.5–5.1)
Sodium: 139 mmol/L (ref 135–145)

## 2019-07-24 LAB — CBC
HCT: 43.7 % (ref 39.0–52.0)
Hemoglobin: 14.8 g/dL (ref 13.0–17.0)
MCH: 29.5 pg (ref 26.0–34.0)
MCHC: 33.9 g/dL (ref 30.0–36.0)
MCV: 87.1 fL (ref 80.0–100.0)
Platelets: 290 10*3/uL (ref 150–400)
RBC: 5.02 MIL/uL (ref 4.22–5.81)
RDW: 13.1 % (ref 11.5–15.5)
WBC: 9.8 10*3/uL (ref 4.0–10.5)
nRBC: 0 % (ref 0.0–0.2)

## 2019-07-24 LAB — PROTIME-INR
INR: 1.1 (ref 0.8–1.2)
Prothrombin Time: 13.8 seconds (ref 11.4–15.2)

## 2019-07-24 LAB — ABO/RH: ABO/RH(D): A POS

## 2019-07-24 LAB — PREALBUMIN: Prealbumin: 14.4 mg/dL — ABNORMAL LOW (ref 18–38)

## 2019-07-24 LAB — TYPE AND SCREEN
ABO/RH(D): A POS
Antibody Screen: NEGATIVE

## 2019-07-24 MED ORDER — KCL-LACTATED RINGERS-D5W 20 MEQ/L IV SOLN
INTRAVENOUS | Status: DC
  Administered 2019-07-25: 75 mL/h via INTRAVENOUS
  Filled 2019-07-24 (×5): qty 1000

## 2019-07-24 MED ORDER — MORPHINE SULFATE (PF) 2 MG/ML IV SOLN: 2.0000 mg | INTRAVENOUS | Status: DC | PRN

## 2019-07-24 MED ORDER — IOHEXOL 300 MG/ML  SOLN
75.0000 mL | Freq: Once | INTRAMUSCULAR | Status: AC | PRN
  Administered 2019-07-24: 75 mL via INTRAVENOUS

## 2019-07-24 MED ORDER — METHOCARBAMOL 1000 MG/10ML IJ SOLN
500.0000 mg | Freq: Four times a day (QID) | INTRAVENOUS | Status: DC | PRN
  Filled 2019-07-24: qty 5

## 2019-07-24 MED ORDER — ASPIRIN 300 MG RE SUPP
150.0000 mg | Freq: Every day | RECTAL | Status: DC
  Administered 2019-07-25 – 2019-07-26 (×2): 150 mg via RECTAL
  Filled 2019-07-24 (×5): qty 1

## 2019-07-24 NOTE — Progress Notes (Signed)
PROGRESS NOTE    Roy [name]  MCN:[id number] DOB: January 02, 1946 DOA: 07/23/2019 PCP: Jinny Sanders, MD      Brief Narrative:  [name] is a 74 y.o. M with CAD s/p PCI in 2001, 2016, depression, HTN, and OSA who presented with abdominal pain and bloating, progressive over a month, now severe.  Patient first noted abdominal discomfort, crampy with bloating about a month ago, this has been waxing and waning, but in the last day he developed nausea and vomiting, inability to pass stool, and cold sweats.  Of note he has had unintentional weight loss of 15 pounds over the last couple months.  In the ER, creatinine 1.65 from baseline 1.2, WBC 7.1, and CT the abdomen and pelvis showed a 5 cm obstructing lesion in the sigmoid colon with small bowel obstruction.        Assessment & Plan:  Malignant small bowel obstruction -Insert NG to LIS -IVF -Consult Gen surg and GI    Coronary disease, secondary prevention Hypertension Had stent in 2001, repeat stent after STEMI in 2016.  >12 months out from PCI.  From a perioperative risk standpoint, his CV risk is higher than average, but overall low: Gupta score is 0.5% and RCRI 1.  He has sleep apnea but does not use CPAP.  No significant respiratory disease, mild pre-renal AKI already resolved.  He is cleared for surgery: -Hold Plavix -Resume statin and metoprolol perioperatively as soon as able  -Hold ACE until post-op -Start aspirin suppository  AKI on CKD stage IIIa Baseline creatinine 1.2, admitted with creatinine 1.6 due to dehydration. -IV fluids -Follow creatinine  Depression -Restart Effexor when able to take p.o.  Prediabetes A1c 6.2%.  Hyperglycemic initially. -Continue q6 glucose checks  Sleep apnea Has sleep apnea, doesn't use CPAP.         Disposition: Status is: Inpatient  Remains inpatient appropriate because:IV treatments appropriate due to intensity of illness or inability to take  PO   Dispo: The patient is from: Home              Anticipated d/c is to: Home              Anticipated d/c date is: > 3 days              Patient currently is not medically stable to d/c.              MDM: The below labs and imaging reports were reviewed and summarized above.  Medication management as above.    DVT prophylaxis: enoxaparin (LOVENOX) injection 40 mg Start: 07/23/19 1800  Code Status: FULL Family Communication: Patient requested not    Consultants:   Gen Surg  GI  Procedures:     Antimicrobials:        Culture data:                Subjective: Patient feels "dead".  He is very weak.  Has some abdominal pain, bloating, nausea.  NG tube is in place now.  Objective: Vitals:   07/23/19 1625 07/23/19 1851 07/23/19 2157 07/24/19 0613  BP: (!) 140/91 124/82 131/86 123/83  Pulse: 90 88 86 78  Resp: 20 18 20 15   Temp: 98.6 F (37 C) 98.4 F (36.9 C) 98.8 F (37.1 C) 98.4 F (36.9 C)  TempSrc: Oral Oral Oral Oral  SpO2: 97% 96% 96% 94%    Intake/Output Summary (Last 24 hours) at 07/24/2019 1150 Last data filed at 07/24/2019 0600  Gross per 24 hour  Intake 1050 ml  Output 600 ml  Net 450 ml   There were no vitals filed for this visit.  Examination: General appearance:  adult male, alert and in no obvious distress.  Appears tense, tired.  Interactive. HEENT: Anicteric, conjunctiva pink, lids and lashes normal. No nasal deformity, discharge, epistaxis.  NG tube in place.  Lips moist, oropharynx moist, no oral lesions, hearing normal.   Skin: Warm and dry.  No jaundice.  No suspicious rashes or lesions. Cardiac: RRR, nl S1-S2, no murmurs appreciated.  Capillary refill is brisk.  JVP not visible.  No LE edema.  Radial pulses 2+ and symmetric. Respiratory: Normal respiratory rate and rhythm.  CTAB without rales or wheezes. Abdomen: Diffusely tender TP with voluntary guarding. No ascites, hepatosplenomegaly.  Mildly  distended. MSK: No deformities or effusions. Neuro: Awake and alert.  EOMI, moves all extremities with normal strength and coordination. Speech fluent.    Psych: Sensorium intact and responding to questions, attention normal. Affect blunted.  Judgment and insight appear normal.    Data Reviewed: I have personally reviewed following labs and imaging studies:  CBC: Recent Labs  Lab 07/23/19 0957 07/24/19 0144  WBC 17.1*  17.7* 9.8  NEUTROABS 13.5*  --   HGB 16.6  16.4 14.8  HCT 48.7  48.2 43.7  MCV 87.1  86.2 87.1  PLT 413*  385 094   Basic Metabolic Panel: Recent Labs  Lab 07/23/19 0957 07/24/19 0144  NA 138 139  K 4.0 4.5  CL 95* 99  CO2 24 29  GLUCOSE 221* 151*  BUN 28* 27*  CREATININE 1.65* 1.14  CALCIUM 10.0 9.2   GFR: Estimated Creatinine Clearance: 56.9 mL/min (by C-G formula based on SCr of 1.14 mg/dL). Liver Function Tests: Recent Labs  Lab 07/23/19 0957  AST 30  ALT 42  ALKPHOS 103  BILITOT 1.2  PROT 7.5  ALBUMIN 3.4*   Recent Labs  Lab 07/23/19 0957  LIPASE 22   No results for input(s): AMMONIA in the last 168 hours. Coagulation Profile: Recent Labs  Lab 07/24/19 0937  INR 1.1   Cardiac Enzymes: No results for input(s): CKTOTAL, CKMB, CKMBINDEX, TROPONINI in the last 168 hours. BNP (last 3 results) No results for input(s): PROBNP in the last 8760 hours. HbA1C: Recent Labs    07/23/19 0957  HGBA1C 6.2*   CBG: Recent Labs  Lab 07/23/19 1541 07/23/19 1731 07/23/19 2332 07/24/19 0635  GLUCAP 146* 151* 130* 134*   Lipid Profile: No results for input(s): CHOL, HDL, LDLCALC, TRIG, CHOLHDL, LDLDIRECT in the last 72 hours. Thyroid Function Tests: No results for input(s): TSH, T4TOTAL, FREET4, T3FREE, THYROIDAB in the last 72 hours. Anemia Panel: No results for input(s): VITAMINB12, FOLATE, FERRITIN, TIBC, IRON, RETICCTPCT in the last 72 hours. Urine analysis:    Component Value Date/Time   COLORURINE LT YELLOW 12/08/2005  0741   LABSPEC 1.025 12/08/2005 0741   PHURINE 6.0 12/08/2005 0741   GLUCOSEU NEGATIVE 12/08/2005 0741   BILIRUBINUR NEGATIVE 12/08/2005 0741   KETONESUR NEGATIVE 12/08/2005 0741   UROBILINOGEN 0.2 mg/dL 12/08/2005 0741   NITRITE Negative 12/08/2005 0741   LEUKOCYTESUR Negative 12/08/2005 0741   Sepsis Labs: @LABRCNTIP (procalcitonin:4,lacticacidven:4)  ) Recent Results (from the past 240 hour(s))  SARS Coronavirus 2 by RT PCR (hospital order, performed in [facility] hospital lab) Nasopharyngeal Nasopharyngeal Swab     Status: None   Collection Time: 07/23/19  3:24 PM   Specimen: Nasopharyngeal Swab  Result Value Ref  Range Status   SARS Coronavirus 2 NEGATIVE NEGATIVE Final    Comment: (NOTE) SARS-CoV-2 target nucleic acids are NOT DETECTED.  The SARS-CoV-2 RNA is generally detectable in upper and lower respiratory specimens during the acute phase of infection. The lowest concentration of SARS-CoV-2 viral copies this assay can detect is 250 copies / mL. A negative result does not preclude SARS-CoV-2 infection and should not be used as the sole basis for treatment or other patient management decisions.  A negative result may occur with improper specimen collection / handling, submission of specimen other than nasopharyngeal swab, presence of viral mutation(s) within the areas targeted by this assay, and inadequate number of viral copies (<250 copies / mL). A negative result must be combined with clinical observations, patient history, and epidemiological information.  Fact Sheet for Patients:   StrictlyIdeas.no  Fact Sheet for Healthcare Providers: BankingDealers.co.za  This test is not yet approved or  cleared by the [place] FDA and has been authorized for detection and/or diagnosis of SARS-CoV-2 by FDA under an Emergency Use Authorization (EUA).  This EUA will remain in effect (meaning this test can be used) for the  duration of the COVID-19 declaration under Section 564(b)(1) of the Act, 21 U.S.C. section 360bbb-3(b)(1), unless the authorization is terminated or revoked sooner.  Performed at [facility], [place] [address], [place], [place] [address]          Radiology Studies: CT ABDOMEN PELVIS W CONTRAST  Result Date: 07/23/2019 CLINICAL DATA:  Abdominal distension and back pain.  Bloody stools. EXAM: CT ABDOMEN AND PELVIS WITH CONTRAST TECHNIQUE: Multidetector CT imaging of the abdomen and pelvis was performed using the standard protocol following bolus administration of intravenous contrast. CONTRAST:  86mL OMNIPAQUE IOHEXOL 300 MG/ML  SOLN COMPARISON:  None. FINDINGS: Lower chest: Streaky right basilar scarring changes are noted. There is a calcified granuloma noted at the left lung base. No worrisome pulmonary lesions or pleural effusion. The heart is normal in size. No pericardial effusion. Coronary artery calcifications are noted. The distal esophagus is grossly normal. Hepatobiliary: Very small low-attenuation lesion at the hepatic dome is likely a benign cyst. No worrisome hepatic lesions are identified. The gallbladder is unremarkable. No common bile duct dilatation. Pancreas: No mass, inflammation or ductal dilatation. Spleen: Normal size. A few small scattered calcified granulomas are noted. Adrenals/Urinary Tract: The adrenal glands are normal. No renal lesions or hydronephrosis.  Bladder is unremarkable. Stomach/Bowel: The stomach is mildly distended with fluid and air. The small bowel is dilated and demonstrates scattered air-fluid levels. The colon is also dilated with fluid and air down to an obstructing enhancing lesion in the upper sigmoid colon near the sigmoid colon descending colon junction region. This measures approximately 5 cm and is highly suspicious for colonic neoplasm causing low colonic obstruction. Recommend surgical consultation. Vascular/Lymphatic: Advanced atherosclerotic  calcifications involving the aorta and iliac arteries. No aneurysm. The branch vessels are patent. The major venous structures are patent. There are small scattered subcentimeter retroperitoneal lymph nodes. No mass or overt adenopathy. No pelvic adenopathy. I do not see any enlarged lymph nodes in the sigmoid mesocolon or in the pelvis to suggest locoregional adenopathy. Reproductive: The prostate gland and seminal vesicles are unremarkable. Other: No free pelvic fluid collections. No inguinal mass or adenopathy. Musculoskeletal: No significant bony findings. IMPRESSION: 1. 5 cm obstructing enhancing lesion in the upper sigmoid colon near the sigmoid colon descending colon junction region worrisome for colonic neoplasm causing low colonic obstruction. Fairly dilated  colon and small bowel above the lesion. Recommend surgical consultation. 2. No findings for locoregional adenopathy or distant metastatic disease. 3. Advanced atherosclerotic calcifications involving the aorta and iliac arteries. 4. Aortic atherosclerosis. Aortic Atherosclerosis (ICD10-I70.0). Electronically Signed   By: Marijo Sanes M.D.   On: 07/23/2019 13:47   DG Chest Port 1 View  Result Date: 07/23/2019 CLINICAL DATA:  Patient arrived by [facility] complaining of lower back pain radiating through to abdomen x 1 month. States that he has had increased gas and dark stools for several weeks. Has just developed nausea and vomiting x 1 day. Alert EXAM: PORTABLE CHEST - 1 VIEW COMPARISON:  02/10/2012 FINDINGS: Linear scarring or subsegmental atelectasis in the right mid lung. Stable calcified granuloma, left lower lung. No evidence of edema. Heart size and mediastinal contours are within normal limits. No effusion.  No pneumothorax. Visualized bones unremarkable. IMPRESSION: New linear scarring or atelectasis, right mid lung. Otherwise negative. Electronically Signed   By: Lucrezia Europe M.D.   On: 07/23/2019 12:21        Scheduled Meds: . aspirin   150 mg Rectal Daily  . enoxaparin (LOVENOX) injection  40 mg Subcutaneous Q24H  . insulin aspart  0-6 Units Subcutaneous Q6H  . pantoprazole (PROTONIX) IV  40 mg Intravenous Q12H  . sodium chloride flush  3 mL Intravenous Once  . sodium chloride flush  3 mL Intravenous Q12H   Continuous Infusions: . dextrose 5% lactated ringers with KCl 20 mEq/L 75 mL/hr at 07/24/19 0852  . methocarbamol (ROBAXIN) IV       LOS: 1 day    Time spent: 25 minutes    Edwin Dada, MD [facility] 07/24/2019, 11:50 AM     Please page though [place] or Epic secure chat:  For [employer], Adult nurse

## 2019-07-24 NOTE — Progress Notes (Signed)
Patient observed not to have voided a significant amt since Friday.  Patient was bladder scanned by nurse tech for 634 ml.  MD notified and patient in and out cathed for 825 ml clear yellow urine.  Some resistence met at point of internal sphincter.  Suggest using coude cath in future.

## 2019-07-24 NOTE — Consult Note (Addendum)
Referring Provider: No ref. provider found Primary Care Physician:  Jinny Sanders, MD Primary Gastroenterologist:  Dr. Norina Buzzard   Reason for Consultation: Mild bowel obstruction, colon mass  HPI: Roy [name] is a 74 y.o. male with a past medical history of hypertension, hyperlipidemia, coronary artery disease s/p stent to RCA in 2001 and 2nd stent placed in 2012 on Plavix, sleep apnea and basal cell carcinoma to his legs. He presented to [facility] ED on 07/23/2019 with complaints of lower back pain and generalized abdominal pain which started 2 months ago and has progressively worsened.  He developed loose stools 1 month ago for which she took Pepto-Bismol.  His stools turn black while he was on Pepto-Bismol.  He was advised by his PCP to stop the Pepto-Bismol which he did and his black stools abated.  However, he continued to have abdominal and back pain.  He developed vomiting 2 days ago. Decreased appetite. He has lost 15 lbs over the past 1 1/2 months.  No fever. Some night swets. Labs in the ED showed a leukocytosis with a WBC 17.7. Elevated creatinine 1.65. LFTs were normal. Sars coronavirus 2 negative. An abdominal/pelvic CAT scan with contrast identified a 5 cm obstructing lesion in the right upper sigmoid/ascending colon junction concerning for a neoplastic process causing low colonic obstruction. Fairly dilated colon and small bowel above the lesion. General surgery was consulted with plans for a colon resection. A GI consult was requested to consider stent placement to facilitate a bowel preparation prior to colon resection with primary anastomosis  +/- diverting loop ileostomy. If he is not a candidate for a stent and cant undergo a bowel prep he will require a Hartman's procedure. He passed a small amount of loose brown stool on the bedpan as I entered his room. He is actively passing gas. He's never undergone a screening colonoscopy. No family history of colon cancer.  His sister underwent a colectomy secondary to chronic opioid use.  Mother with history of alcoholic cirrhosis.  History of coronary artery disease with a coronary stent.  His last dose of Plavix was taken on 07/22/2019.  He takes omeprazole 20 mg daily.  No current GERD symptoms.  No NSAID use.  He drinks 3 cocktails once weekly.  He is a non-smoker.  He lives alone.  ED course: Sodium 138. Potassium 4.0. Glucose 221. BUN 28. Creatinine 1.65. Alk phos 103. Albumin 3.4. Lipase 22. AST 30. ALT 42. Total bili 1.2. WBC 17.7. Hemoglobin 16.4. Hematocrit 48.2. Platelet 385.  Abdominal/Pelvic CT with contrast: 1. 5 cm obstructing enhancing lesion in the upper sigmoid colon near the sigmoid colon descending colon junction region worrisome for colonic neoplasm causing low colonic obstruction. Fairly dilated colon and small bowel above the lesion. Recommend surgical consultation. 2. No findings for locoregional adenopathy or distant metastatic disease. 3. Advanced atherosclerotic calcifications involving the aorta and iliac arteries. 4. Aortic atherosclerosis. Aortic Atherosclerosis    Past Medical History:  Diagnosis Date  . Basal cell carcinoma    Bilateral Legs  . CAD (coronary artery disease)    a.  stent to the RCA in 2001;  b.  Negative Myoview in January 2012;  c.  LHC 4/16:  dLM 20, small D1 50-70, D2 sub-total, oLCx 50-70, RCA stent ok, dPLB 90, EF 35-40% >> DES to D2  . Depression   . History of echocardiogram    a.  Echo 4/16: Mild LVH, EF 98-92%, grade 1 diastolic dysfunction, normal wall  motion, MAC  . HLD (hyperlipidemia)   . HTN (hypertension)   . OSA (obstructive sleep apnea)   . Vertigo     Past Surgical History:  Procedure Laterality Date  . CATARACT EXTRACTION, BILATERAL    . CORONARY STENT PLACEMENT  2001    RCA  . LEFT HEART CATHETERIZATION WITH CORONARY ANGIOGRAM N/A 02/13/2012   Procedure: LEFT HEART CATHETERIZATION WITH CORONARY ANGIOGRAM;  Surgeon: [name],  MD;  Location: [facility] CATH LAB;  Service: Cardiovascular;  Laterality: N/A;  . LEFT HEART CATHETERIZATION WITH CORONARY ANGIOGRAM N/A 04/24/2014   Procedure: LEFT HEART CATHETERIZATION WITH CORONARY ANGIOGRAM;  Surgeon: [name], MD;  Location: [facility] CATH LAB;  Service: Cardiovascular;  Laterality: N/A;  . TONSILLECTOMY      Prior to Admission medications   Medication Sig Start Date End Date Taking? Authorizing Provider  ALPRAZolam Duanne Moron) 0.25 MG tablet Take 0.25 mg by mouth at bedtime as needed for sleep.   Yes [provider]  atorvastatin (LIPITOR) 40 MG tablet TAKE 1 TABLET (40 MG TOTAL) BY MOUTH AT BEDTIME. PLEASE KEEP UPCOMING APPT WITH DR. Johnsie Cancel IN AUGUST FOR FUTURE REFILLS. Morgan YOU Patient taking differently: Take 40 mg by mouth in the morning.  09/27/18  Yes Josue Hector, MD  carvedilol (COREG) 12.5 MG tablet TAKE 1 TABLET (12.5 MG TOTAL) BY MOUTH 2 (TWO) TIMES DAILY. Patient taking differently: Take 12.5 mg by mouth 2 (two) times daily with a meal.  11/02/18  Yes Josue Hector, MD  clopidogrel (PLAVIX) 75 MG tablet TAKE 1 TABLET BY MOUTH EVERY DAY Patient taking differently: Take 75 mg by mouth in the morning.  10/12/18  Yes Josue Hector, MD  Glucosamine HCl (GLUCOSAMINE PO) Take 2 tablets by mouth daily.   Yes [provider]  isosorbide mononitrate (IMDUR) 60 MG 24 hr tablet Take 1 tablet (60 mg total) by mouth daily. 09/14/18  Yes Josue Hector, MD  lisinopril (ZESTRIL) 5 MG tablet Take 1 tablet (5 mg total) by mouth daily. 09/14/18  Yes Josue Hector, MD  Magnesium Oxide 200 MG TABS Take 200 mg by mouth daily.   Yes [provider]  nitroGLYCERIN (NITROSTAT) 0.4 MG SL tablet PLACE 1 TABLET UNDER THE TONGUE EVERY 5 MINUTES AS NEEDED FOR CHEST PAIN. MAX OF 3 DOSES Patient taking differently: Place 0.4 mg under the tongue every 5 (five) minutes x 3 doses as needed for chest pain.  03/23/17  Yes Josue Hector, MD  omeprazole (PRILOSEC) 20 MG  capsule Take 1 capsule (20 mg total) by mouth daily. Patient taking differently: Take 20 mg by mouth daily before breakfast.  12/21/18  Yes Josue Hector, MD  ondansetron (ZOFRAN) 4 MG tablet Take 1 tablet (4 mg total) by mouth every 8 (eight) hours as needed for nausea or vomiting. 07/01/19  Yes [name], [name], MD  venlafaxine XR (EFFEXOR-XR) 75 MG 24 hr capsule TAKE 1 CAPSULE (75 MG TOTAL) BY MOUTH DAILY WITH BREAKFAST. 06/20/19  Yes [name], [name], MD    Current Facility-Administered Medications  Medication Dose Route Frequency Provider Last Rate Last Admin  . acetaminophen (TYLENOL) tablet 650 mg  650 mg Oral Q6H PRN Fuller Plan A, MD       Or  . acetaminophen (TYLENOL) suppository 650 mg  650 mg Rectal Q6H PRN Smith, Rondell A, MD      . albuterol (PROVENTIL) (2.5 MG/3ML) 0.083% nebulizer solution 2.5 mg  2.5 mg Nebulization Q6H PRN Tamala Julian,  Rondell A, MD      . aspirin suppository 150 mg  150 mg Rectal Daily Lala Lund K, MD      . dextrose 5% in lactated ringers with KCl 20 mEq/L infusion   Intravenous Continuous Thurnell Lose, MD 75 mL/hr at 07/24/19 0852 New Bag at 07/24/19 4982  . enoxaparin (LOVENOX) injection 40 mg  40 mg Subcutaneous Q24H Smith, Rondell A, MD      . insulin aspart (novoLOG) injection 0-6 Units  0-6 Units Subcutaneous Q6H Smith, Rondell A, MD      . labetalol (NORMODYNE) injection 5 mg  5 mg Intravenous Q2H PRN Smith, Rondell A, MD      . LORazepam (ATIVAN) injection 0.25 mg  0.25 mg Intravenous QHS PRN Smith, Rondell A, MD      . methocarbamol (ROBAXIN) 500 mg in dextrose 5 % 50 mL IVPB  500 mg Intravenous Q6H PRN Meuth, Brooke A, PA-C      . morphine 2 MG/ML injection 2 mg  2 mg Intravenous Q3H PRN Meuth, Brooke A, PA-C      . ondansetron (ZOFRAN) injection 4 mg  4 mg Intravenous Q6H PRN Nanavati, Ankit, MD      . pantoprazole (PROTONIX) injection 40 mg  40 mg Intravenous Q12H Smith, Rondell A, MD   40 mg at 07/23/19 2211  . sodium chloride flush (NS) 0.9  % injection 3 mL  3 mL Intravenous Once Nanavati, Ankit, MD      . sodium chloride flush (NS) 0.9 % injection 3 mL  3 mL Intravenous Q12H Smith, Rondell A, MD   3 mL at 07/23/19 2211    Allergies as of 07/23/2019  . (No Known Allergies)    Family History  Problem Relation Age of Onset  . Cirrhosis Mother   . Alcohol abuse Mother   . Heart disease Father   . Hypertension Father   . Peripheral vascular disease Father   . Depression Sister   . Heart attack Neg Hx   . Stroke Neg Hx     Social History   Socioeconomic History  . Marital status: Divorced    Spouse name: Not on file  . Number of children: Not on file  . Years of education: Not on file  . Highest education level: Not on file  Occupational History  . Not on file  Tobacco Use  . Smoking status: Former Smoker    Packs/day: 1.00    Years: 25.00    Pack years: 25.00    Types: Cigarettes    Quit date: 01/14/2000    Years since quitting: 19.5  . Smokeless tobacco: Never Used  Vaping Use  . Vaping Use: Never used  Substance and Sexual Activity  . Alcohol use: Yes    Alcohol/week: 3.0 standard drinks    Types: 2 Shots of liquor, 1 Cans of beer per week  . Drug use: No  . Sexual activity: Not Currently  Other Topics Concern  . 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   Social Determinants of Health   Financial Resource Strain:   . Difficulty of Paying Living Expenses:   Food Insecurity:   . Worried About Charity fundraiser in the Last Year:   . Arboriculturist in the Last Year:   Transportation Needs:   . Film/video editor (Medical):   Marland Kitchen  Lack of Transportation (Non-Medical):   Physical Activity:   . Days of Exercise per Week:   . Minutes of Exercise per Session:   Stress:   . Feeling of Stress :   Social Connections:   . Frequency of Communication with Friends and Family:   . Frequency of  Social Gatherings with Friends and Family:   . Attends Religious Services:   . Active Member of Clubs or Organizations:   . Attends Archivist Meetings:   Marland Kitchen Marital Status:   Intimate Partner Violence:   . Fear of Current or Ex-Partner:   . Emotionally Abused:   Marland Kitchen Physically Abused:   . Sexually Abused:     Review of Systems: Gen: + weight loss.  CV: Denies chest pain, palpitations or edema. Resp: Denies cough, shortness of breath of hemoptysis.  GI: See HPI.  GU : Denies urinary burning, blood in urine, increased urinary frequency or incontinence. MS: Denies joint pain, muscles aches or weakness. Derm: Denies rash, itchiness, skin lesions or unhealing ulcers. Psych: Denies depression, anxiety, memory loss or confusion. Heme: Denies easy bruising, bleeding. Neuro:  Denies headaches, dizziness or paresthesias. Endo:  Denies any problems with DM, thyroid or adrenal function.  Physical Exam: Vital signs in last 24 hours: Temp:  [98 F (36.7 C)-98.8 F (37.1 C)] 98.4 F (36.9 C) (07/11 7425) Pulse Rate:  [78-98] 78 (07/11 0613) Resp:  [15-20] 15 (07/11 9563) BP: (113-140)/(82-91) 123/83 (07/11 8756) SpO2:  [94 %-100 %] 94 % (07/11 4332) Last BM Date: 07/23/19 General: Alert fatigued appearing 74 year old male in no acute distress. Head:  Normocephalic and atraumatic. Eyes:  No scleral icterus. Conjunctiva pink. Ears:  Normal auditory acuity. Nose:  No deformity, discharge or lesions. Mouth:  Dentition intact. No ulcers or lesions.  Neck:  Supple. No lymphadenopathy or thyromegaly.  Lungs: Sounds clear but diminished in bases bilaterally. Heart: Regular rate and rhythm, no murmurs. Abdomen: Abdomen is somewhat tight and distended with mild generalized tenderness.  Hypoactive bowel sounds present to all 4 quadrants.  NG tube currently clamped.  No HSM.  Palpable mass. Rectal: Deferred. Musculoskeletal:  Symmetrical without gross deformities.  Pulses:  Normal pulses  noted. Extremities:  Without clubbing or edema. Neurologic:  Alert and  oriented x4. No focal deficits.  Skin:  Intact without significant lesions or rashes. Psych:  Alert and cooperative. Normal mood and affect.  Intake/Output from previous day: 07/10 0701 - 07/11 0700 In: 1050 [IV Piggyback:1050] Out: 600 [Emesis/NG output:100] Intake/Output this shift: No intake/output data recorded.  Lab Results: Recent Labs    07/23/19 0957 07/24/19 0144  WBC 17.1*  17.7* 9.8  HGB 16.6  16.4 14.8  HCT 48.7  48.2 43.7  PLT 413*  385 290   BMET Recent Labs    07/23/19 0957 07/24/19 0144  NA 138 139  K 4.0 4.5  CL 95* 99  CO2 24 29  GLUCOSE 221* 151*  BUN 28* 27*  CREATININE 1.65* 1.14  CALCIUM 10.0 9.2   LFT Recent Labs    07/23/19 0957  PROT 7.5  ALBUMIN 3.4*  AST 30  ALT 42  ALKPHOS 103  BILITOT 1.2   PT/INR Recent Labs    07/24/19 0937  LABPROT 13.8  INR 1.1   Hepatitis Panel No results for input(s): HEPBSAG, HCVAB, HEPAIGM, HEPBIGM in the last 72 hours.    Studies/Results: CT ABDOMEN PELVIS W CONTRAST  Result Date: 07/23/2019 CLINICAL DATA:  Abdominal distension and back pain.  Bloody stools.  EXAM: CT ABDOMEN AND PELVIS WITH CONTRAST TECHNIQUE: Multidetector CT imaging of the abdomen and pelvis was performed using the standard protocol following bolus administration of intravenous contrast. CONTRAST:  52m OMNIPAQUE IOHEXOL 300 MG/ML  SOLN COMPARISON:  None. FINDINGS: Lower chest: Streaky right basilar scarring changes are noted. There is a calcified granuloma noted at the left lung base. No worrisome pulmonary lesions or pleural effusion. The heart is normal in size. No pericardial effusion. Coronary artery calcifications are noted. The distal esophagus is grossly normal. Hepatobiliary: Very small low-attenuation lesion at the hepatic dome is likely a benign cyst. No worrisome hepatic lesions are identified. The gallbladder is unremarkable. No common bile  duct dilatation. Pancreas: No mass, inflammation or ductal dilatation. Spleen: Normal size. A few small scattered calcified granulomas are noted. Adrenals/Urinary Tract: The adrenal glands are normal. No renal lesions or hydronephrosis.  Bladder is unremarkable. Stomach/Bowel: The stomach is mildly distended with fluid and air. The small bowel is dilated and demonstrates scattered air-fluid levels. The colon is also dilated with fluid and air down to an obstructing enhancing lesion in the upper sigmoid colon near the sigmoid colon descending colon junction region. This measures approximately 5 cm and is highly suspicious for colonic neoplasm causing low colonic obstruction. Recommend surgical consultation. Vascular/Lymphatic: Advanced atherosclerotic calcifications involving the aorta and iliac arteries. No aneurysm. The branch vessels are patent. The major venous structures are patent. There are small scattered subcentimeter retroperitoneal lymph nodes. No mass or overt adenopathy. No pelvic adenopathy. I do not see any enlarged lymph nodes in the sigmoid mesocolon or in the pelvis to suggest locoregional adenopathy. Reproductive: The prostate gland and seminal vesicles are unremarkable. Other: No free pelvic fluid collections. No inguinal mass or adenopathy. Musculoskeletal: No significant bony findings. IMPRESSION: 1. 5 cm obstructing enhancing lesion in the upper sigmoid colon near the sigmoid colon descending colon junction region worrisome for colonic neoplasm causing low colonic obstruction. Fairly dilated colon and small bowel above the lesion. Recommend surgical consultation. 2. No findings for locoregional adenopathy or distant metastatic disease. 3. Advanced atherosclerotic calcifications involving the aorta and iliac arteries. 4. Aortic atherosclerosis. Aortic Atherosclerosis (ICD10-I70.0). Electronically Signed   By: PMarijo SanesM.D.   On: 07/23/2019 13:47   DG Chest Port 1 View  Result Date:  07/23/2019 CLINICAL DATA:  [facility] radi[facility] through to abdomen x 1 month. States that he has had increased gas and dark stools for several weeks. Has just developed nausea and vomiting x 1 day. Alert EXAM: PORTABLE CHEST - 1 VIEW COMPARISON:  02/10/2012 FINDINGS: Linear scarring or subsegmental atelectasis in the right mid lung. Stable calcified granuloma, left lower lung. No evidence of edema. Heart size and mediastinal contours are within normal limits. No effusion.  No pneumothorax. Visualized bones unremarkable. IMPRESSION: New linear scarring or atelectasis, right mid lung. Otherwise negative. Electronically Signed   By: DLucrezia EuropeM.D.   On: 07/23/2019 12:21    IMPRESSION/PLAN:  155 74year old with N/V and generalized abdominal pain.  An abdominal/pelvic CAT scan with contrast identified a 5 cm obstructing lesion in the right upper sigmoid/ascending colon junction concerning for a neoplastic process causing low colonic obstruction. Fairly dilated colon and small bowel above the lesion. General surgery was consulted with plans for a colon resection. GI consult was requested to consider stent placement to facilitate a bowel preparation prior to colon resection with primary anastomosis  +/- diverting loop ileostomy. If he is not a  candidate for a stent and cant undergo a bowel prep he will require a Hartman's procedure. -NPO -NGT to LIS -Possible colonoscopy with stent placement, await recommendations from Dr. Lyndel Safe -IVF per the hospitalist  -PPI  IV Q 24 hours -Zofran 4 mg IV Q 6 hrs PRN -KUB in am -Recommend starting TPN  2. CAD with 2 stents. Last dose of Plavix was on 7/9  3. AKI, improving  4. Leukocytosis. WBC 17.1.   5. Sleep apnea  Noralyn Pick  07/24/2019, 12:45 PM    Attending physician's note   I have taken an interval history, reviewed the chart and examined the patient. I agree with the Advanced Practitioner's note,  impression and recommendations.   CT reviewed/patient examined  5 cm nearly completely obstructing Px sigmoid mass (without any distant mets) on CT.  Currently has NG tube.  Has passed flatus and had a BM today. Abdo appears to be much softer.  Surgery following.  CAD s/p PTCA/stents on Plavix (last dose 07/22/2019). Nl EF  Has associated HTN, HLD, OSA  Deconditioning/malnutrition with prealbumin 14  Plan: -Observe for the next 24 hours. Rpt x-ray KUB (supine/upright) in AM. to reassess for obstruction.  If obstn is better, then will prep through NG. -If no improvement, then colonic stenting after gentle enema to aid in prep and to perform Bx. Would prefer to avoid Hartman's if possible.  -Will discuss with Dr. Carlean Purl who is taking over inpt svc tomorrow. Dr Rush Landmark is also available tomorrow. Note that his last dose of Plavix was 07/22/2019. -Check CEA level. -Do recommend TPN in 24-48hrs to help with nutritional status.   Carmell Austria, MD Velora Heckler Fabienne Bruns [phone].

## 2019-07-24 NOTE — Progress Notes (Signed)
Spoke with patient to obtain consent for PICC placement to initiate TPN. Purpose explained to provide nutrition intravascularly, while patient is not able to obtain nutrition enterally, and other benefits of a PICC. This Pryor Curia explained the need for a PICC vs. the use of present IV vs. the placement of a central line by the physician. He stated that his nurse explained that he would be getting nutrition through his veins. This author explained that while his NGT was in place, it is used to decompress his stomach, and they were not feeding him. Due to needing nutrition and not being able to eat normally, nutrition is given through the veins. This is where a PICC line is the best alternative. He stated that he wanted to wait and speak with the treatment team to answer his questions in relation to the care plan.

## 2019-07-24 NOTE — Progress Notes (Signed)
PHARMACY - TOTAL PARENTERAL NUTRITION CONSULT NOTE   Indication: Small bowel obstruction  Patient Measurements:     There is no height or weight on file to calculate BMI. Usual Weight:   Assessment: 76 yom presenting 7/10 with low back/generalized abdominal pain x 2 months, decreased appetite, N/V, loose stools - noted progressively worsening. 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 General surgery consulted with plans for colon resection. GI consult requested to consider stent placement to facilitate bowel preparation prior to colon resection with primary anastomosis +/- diverting loop ileostomy. If not candidate for stent and cannot undergo bowel prep, will require Hartman's procedure. Pharmacy consulted to initiate TPN. Noted 15lb wt loss over last 1.5 months.  Glucose / Insulin: No hx DM. A1c 6.2%. CBGs 120-150s off TPN. sSSI q6h Electrolytes: all WNL, no phos or mag available yet Renal: SCr down to 1.14 LFTs / TGs: LFTs / Tbili WNL. No TG yet Prealbumin / albumin: Prealbumin 14.4; albumin 3.4 Intake / Output; MIVF: MIVF: D5LR+20K at 75 ml/hr. LBM 7/10 GI Imaging: 7/10 CT abd pelvis - worrisome for colonic neoplasm causing low colonic obstruction, fairly dilated colon and small bowel above the lesion 7/11 CT chest - no findings for pulmonary metastatic dz, no mediastinal or hilar mass or adenopathy, persistent dilated small bowel and colon Surgeries / Procedures:   Central access: none TPN start date: 07/25/2019  Nutritional Goals (per RD recommendation on pending): kCal: , Protein: , Fluid:  Current Nutrition:  NPO   Plan:  TPN will be started 7/12, as order placed after noon deadline TPN labs and dietitian consult ordered Contacted MD (Danford) regarding need for PICC placement - states he will place order   Arturo Morton, PharmD, BCPS Please check AMION for all [place] contact numbers Clinical Pharmacist 07/24/2019 2:22 PM

## 2019-07-24 NOTE — Progress Notes (Signed)
[facility] Progress Note     Subjective: CC-  Continues to have some abdominal discomfort and bloating. Passing flatus and loose  stool. NG tube with thick/brown drainage. GI consult, CT chest, CEA pending.  Objective: Vital signs in last 24 hours: Temp:  [98 F (36.7 C)-98.8 F (37.1 C)] 98.4 F (36.9 C) (07/11 5631) Pulse Rate:  [78-125] 78 (07/11 0613) Resp:  [15-20] 15 (07/11 0613) BP: (113-140)/(82-101) 123/83 (07/11 0613) SpO2:  [94 %-100 %] 94 % (07/11 0613) Last BM Date: 07/23/19  Intake/Output from previous day: 07/10 0701 - 07/11 0700 In: 1050 [IV Piggyback:1050] Out: 600 [Emesis/NG output:100] Intake/Output this shift: No intake/output data recorded.  PE: Gen:  Alert, NAD, pleasant HEENT: EOM's intact, pupils equal and round Card:  RRR Pulm:  CTAB, no W/R/R, rate and effort normal Abd: Soft, mild distension, +BS, no HSM, no hernia, mild diffuse tenderness without rebound or guarding Psych: A&Ox Skin: no rashes noted, warm and dry  Lab Results:  Recent Labs    07/23/19 0957 07/24/19 0144  WBC 17.1*  17.7* 9.8  HGB 16.6  16.4 14.8  HCT 48.7  48.2 43.7  PLT 413*  385 290   BMET Recent Labs    07/23/19 0957 07/24/19 0144  NA 138 139  K 4.0 4.5  CL 95* 99  CO2 24 29  GLUCOSE 221* 151*  BUN 28* 27*  CREATININE 1.65* 1.14  CALCIUM 10.0 9.2   PT/INR No results for input(s): LABPROT, INR in the last 72 hours. CMP     Component Value Date/Time   NA 139 07/24/2019 0144   K 4.5 07/24/2019 0144   CL 99 07/24/2019 0144   CO2 29 07/24/2019 0144   GLUCOSE 151 (H) 07/24/2019 0144   GLUCOSE 118 (H) 12/08/2005 0741   BUN 27 (H) 07/24/2019 0144   CREATININE 1.14 07/24/2019 0144   CREATININE 1.11 04/02/2015 1514   CALCIUM 9.2 07/24/2019 0144   PROT 7.5 07/23/2019 0957   PROT 6.6 03/26/2016 0736   ALBUMIN 3.4 (L) 07/23/2019 0957   ALBUMIN 4.0 03/26/2016 0736   AST 30 07/23/2019 0957   ALT 42 07/23/2019 0957   ALKPHOS 103  07/23/2019 0957   BILITOT 1.2 07/23/2019 0957   BILITOT 0.3 03/26/2016 0736   GFRNONAA >60 07/24/2019 0144   GFRAA >60 07/24/2019 0144   Lipase     Component Value Date/Time   LIPASE 22 07/23/2019 0957       Studies/Results: CT ABDOMEN PELVIS W CONTRAST  Result Date: 07/23/2019 CLINICAL DATA:  Abdominal distension and back pain.  Bloody stools. EXAM: CT ABDOMEN AND PELVIS WITH CONTRAST TECHNIQUE: Multidetector CT imaging of the abdomen and pelvis was performed using the standard protocol following bolus administration of intravenous contrast. CONTRAST:  18mL OMNIPAQUE IOHEXOL 300 MG/ML  SOLN COMPARISON:  None. FINDINGS: Lower chest: Streaky right basilar scarring changes are noted. There is a calcified granuloma noted at the left lung base. No worrisome pulmonary lesions or pleural effusion. The heart is normal in size. No pericardial effusion. Coronary artery calcifications are noted. The distal esophagus is grossly normal. Hepatobiliary: Very small low-attenuation lesion at the hepatic dome is likely a benign cyst. No worrisome hepatic lesions are identified. The gallbladder is unremarkable. No common bile duct dilatation. Pancreas: No mass, inflammation or ductal dilatation. Spleen: Normal size. A few small scattered calcified granulomas are noted. Adrenals/Urinary Tract: The adrenal glands are normal. No renal lesions or hydronephrosis.  Bladder is unremarkable. Stomach/Bowel: The stomach is mildly  distended with fluid and air. The small bowel is dilated and demonstrates scattered air-fluid levels. The colon is also dilated with fluid and air down to an obstructing enhancing lesion in the upper sigmoid colon near the sigmoid colon descending colon junction region. This measures approximately 5 cm and is highly suspicious for colonic neoplasm causing low colonic obstruction. Recommend surgical consultation. Vascular/Lymphatic: Advanced atherosclerotic calcifications involving the aorta and iliac  arteries. No aneurysm. The branch vessels are patent. The major venous structures are patent. There are small scattered subcentimeter retroperitoneal lymph nodes. No mass or overt adenopathy. No pelvic adenopathy. I do not see any enlarged lymph nodes in the sigmoid mesocolon or in the pelvis to suggest locoregional adenopathy. Reproductive: The prostate gland and seminal vesicles are unremarkable. Other: No free pelvic fluid collections. No inguinal mass or adenopathy. Musculoskeletal: No significant bony findings. IMPRESSION: 1. 5 cm obstructing enhancing lesion in the upper sigmoid colon near the sigmoid colon descending colon junction region worrisome for colonic neoplasm causing low colonic obstruction. Fairly dilated colon and small bowel above the lesion. Recommend surgical consultation. 2. No findings for locoregional adenopathy or distant metastatic disease. 3. Advanced atherosclerotic calcifications involving the aorta and iliac arteries. 4. Aortic atherosclerosis. Aortic Atherosclerosis (ICD10-I70.0). Electronically Signed   By: Marijo Sanes M.D.   On: 07/23/2019 13:47   DG Chest Port 1 View  Result Date: 07/23/2019 CLINICAL DATA:  Patient arrived by [facility] complaining of lower back pain radiating through to abdomen x 1 month. States that he has had increased gas and dark stools for several weeks. Has just developed nausea and vomiting x 1 day. Alert EXAM: PORTABLE CHEST - 1 VIEW COMPARISON:  02/10/2012 FINDINGS: Linear scarring or subsegmental atelectasis in the right mid lung. Stable calcified granuloma, left lower lung. No evidence of edema. Heart size and mediastinal contours are within normal limits. No effusion.  No pneumothorax. Visualized bones unremarkable. IMPRESSION: New linear scarring or atelectasis, right mid lung. Otherwise negative. Electronically Signed   By: Lucrezia Europe M.D.   On: 07/23/2019 12:21    Anti-infectives: Anti-infectives (From admission, onward)   None        Assessment/Plan HTN HLD CAD (on plavix, last dose 7/9) - hold plavix DM - A1c 6.2 Malnutrition - albumin 3.4, check prealbumin AKI - improving, continue IVF  Partially obstructing sigmoid lesion/mass - CT abd/pelvis withnNo findings for locoregional adenopathy or distant metastatic disease - CT chest and CEA pending - If colon adenocarcinoma and no metastatic disease but persistent obstructive like sxs, could entertain stent placement for prep, complete scope and possibly resection/anastomosis +/- diverting loop ileostomy. If not stent candidate may need Hartmann's procedure as colon won't be able to be prepped and will have large column of stool proximally  ID - none FEN - IVF, NPO/NGT to LIWS VTE - SCDs, lovenox Foley - none Follow up - TBD  Plan - Continue NPO/NGT. Await GI and cardiology consults. CT chest and CEA pending.   LOS: 1 day    [place], [facility] 07/24/2019, 8:55 AM Please see Amion for pager number during day hours 7:00am-4:30pm

## 2019-07-25 ENCOUNTER — Inpatient Hospital Stay: Payer: Self-pay

## 2019-07-25 ENCOUNTER — Inpatient Hospital Stay (HOSPITAL_COMMUNITY): Payer: Medicare HMO

## 2019-07-25 ENCOUNTER — Encounter (HOSPITAL_COMMUNITY): Payer: Self-pay | Admitting: Internal Medicine

## 2019-07-25 DIAGNOSIS — I2510 Atherosclerotic heart disease of native coronary artery without angina pectoris: Secondary | ICD-10-CM

## 2019-07-25 DIAGNOSIS — Z01810 Encounter for preprocedural cardiovascular examination: Secondary | ICD-10-CM

## 2019-07-25 DIAGNOSIS — K56609 Unspecified intestinal obstruction, unspecified as to partial versus complete obstruction: Secondary | ICD-10-CM

## 2019-07-25 DIAGNOSIS — K6389 Other specified diseases of intestine: Secondary | ICD-10-CM

## 2019-07-25 DIAGNOSIS — E43 Unspecified severe protein-calorie malnutrition: Secondary | ICD-10-CM | POA: Insufficient documentation

## 2019-07-25 LAB — COMPREHENSIVE METABOLIC PANEL
ALT: 32 U/L (ref 0–44)
AST: 23 U/L (ref 15–41)
Albumin: 2.8 g/dL — ABNORMAL LOW (ref 3.5–5.0)
Alkaline Phosphatase: 72 U/L (ref 38–126)
Anion gap: 10 (ref 5–15)
BUN: 20 mg/dL (ref 8–23)
CO2: 28 mmol/L (ref 22–32)
Calcium: 9.1 mg/dL (ref 8.9–10.3)
Chloride: 104 mmol/L (ref 98–111)
Creatinine, Ser: 1.1 mg/dL (ref 0.61–1.24)
GFR calc Af Amer: >60 mL/min (ref >60)
GFR calc non Af Amer: >60 mL/min (ref >60)
Glucose, Bld: 128 mg/dL — ABNORMAL HIGH (ref 70–99)
Potassium: 4.5 mmol/L (ref 3.5–5.1)
Sodium: 142 mmol/L (ref 135–145)
Total Bilirubin: 1.1 mg/dL (ref 0.3–1.2)
Total Protein: 6 g/dL — ABNORMAL LOW (ref 6.5–8.1)

## 2019-07-25 LAB — CBC WITH DIFFERENTIAL/PLATELET
Abs Immature Granulocytes: 0.02 10*3/uL (ref 0.00–0.07)
Basophils Absolute: 0 10*3/uL (ref 0.0–0.1)
Basophils Relative: 0 %
Eosinophils Absolute: 0.1 10*3/uL (ref 0.0–0.5)
Eosinophils Relative: 1 %
HCT: 43.4 % (ref 39.0–52.0)
Hemoglobin: 14.2 g/dL (ref 13.0–17.0)
Immature Granulocytes: 0 %
Lymphocytes Relative: 18 %
Lymphs Abs: 1.2 10*3/uL (ref 0.7–4.0)
MCH: 29.4 pg (ref 26.0–34.0)
MCHC: 32.7 g/dL (ref 30.0–36.0)
MCV: 89.9 fL (ref 80.0–100.0)
Monocytes Absolute: 1 10*3/uL (ref 0.1–1.0)
Monocytes Relative: 15 %
Neutro Abs: 4.4 10*3/uL (ref 1.7–7.7)
Neutrophils Relative %: 66 %
Platelets: 240 10*3/uL (ref 150–400)
RBC: 4.83 MIL/uL (ref 4.22–5.81)
RDW: 13.1 % (ref 11.5–15.5)
WBC Morphology: INCREASED
WBC: 6.8 10*3/uL (ref 4.0–10.5)
nRBC: 0 % (ref 0.0–0.2)

## 2019-07-25 LAB — BRAIN NATRIURETIC PEPTIDE: B Natriuretic Peptide: 54 pg/mL (ref 0.0–100.0)

## 2019-07-25 LAB — GLUCOSE, CAPILLARY
Glucose-Capillary: 132 mg/dL — ABNORMAL HIGH (ref 70–99)
Glucose-Capillary: 145 mg/dL — ABNORMAL HIGH (ref 70–99)
Glucose-Capillary: 128 mg/dL — ABNORMAL HIGH (ref 70–99)
Glucose-Capillary: 116 mg/dL — ABNORMAL HIGH (ref 70–99)
Glucose-Capillary: 127 mg/dL — ABNORMAL HIGH (ref 70–99)
Glucose-Capillary: 107 mg/dL — ABNORMAL HIGH (ref 70–99)

## 2019-07-25 LAB — TRIGLYCERIDES: Triglycerides: 130 mg/dL (ref <150)

## 2019-07-25 LAB — ECHOCARDIOGRAM COMPLETE
Height: 66 in
Weight: 2684.8 oz

## 2019-07-25 LAB — MAGNESIUM: Magnesium: 2.1 mg/dL (ref 1.7–2.4)

## 2019-07-25 LAB — PHOSPHORUS: Phosphorus: 2.7 mg/dL (ref 2.5–4.6)

## 2019-07-25 LAB — PREALBUMIN: Prealbumin: 13.3 mg/dL — ABNORMAL LOW (ref 18–38)

## 2019-07-25 MED ORDER — METOCLOPRAMIDE HCL 5 MG/ML IJ SOLN
5.0000 mg | Freq: Once | INTRAMUSCULAR | Status: AC
  Administered 2019-07-25: 5 mg via INTRAVENOUS
  Filled 2019-07-25: qty 2

## 2019-07-25 MED ORDER — PEG 3350-KCL-NA BICARB-NACL 420 G PO SOLR
4000.0000 mL | Freq: Once | ORAL | Status: AC
  Administered 2019-07-25: 4000 mL via ORAL
  Filled 2019-07-25: qty 4000

## 2019-07-25 MED ORDER — PEG 3350-KCL-NA BICARB-NACL 420 G PO SOLR
4000.0000 mL | Freq: Once | ORAL | Status: DC
  Filled 2019-07-25: qty 4000

## 2019-07-25 MED ORDER — METOCLOPRAMIDE HCL 5 MG/ML IJ SOLN
10.0000 mg | Freq: Once | INTRAMUSCULAR | Status: AC
  Administered 2019-07-25: 10 mg via INTRAVENOUS
  Filled 2019-07-25: qty 2

## 2019-07-25 NOTE — Consult Note (Addendum)
Cardiology Consultation:   Patient ID: Roy [name] MRN: [id number]; DOB: 11/18/1945  Admit date: 07/23/2019 Date of Consult: 07/25/2019  Primary Care Provider: Jinny Sanders, MD [facility] [place] [facility] Cardiologist: Jenkins Rouge, MD  [place] Electrophysiologist:  None    Patient Profile:   Roy [name] is a 74 y.o. male with a hx of stent to RCA 2001, STEMI 2016 with DES to [place].and residual PLB disease, ranexa not much benefit, last myovue 2018 non ischemic EF 58%, DM, HTN, HLD  who is being seen today for the evaluation of pre-op eval for colonoscopy for Small bowel obst. And distal colonic lesion at the request of Dr. Loleta Books .  History of Present Illness:   [name] with above cardiac hx and had been doing well until presented to ER for lower back pain and abd pain for 1 month.  + dark stools.  N&V.  Plavix held until stool returned neg for heme.  + 15 lb wt loss over last 2-3 months.  NG placed for small bowel obstruction.   CT scan of the abdomen pelvis with contrast showed a 5 cm obstructing lesion in the upper sigmoid colon with concern for small bowel obstruction.  General surgery was consulted and GI.    EKG:  The EKG was personally reviewed and demonstrates:  SR at 95 with LAD  LAFB no acute ST changes.  Telemetry:  Telemetry was personally reviewed and demonstrates:  SR  Na 142, K+ 4.5, Cr 1.10, Mg+ 2.1, Albumin 2.8   BNP 54 TG 130 H/H 14.2/43.4 WBC 6.8 plts 240   BP 122/81 P 83, T 98.1  Prior to 2 months ago he was walking 3 miles per day and doing yard with push mower with no chest pain or SOB. Once bloating began he stopped doing much.  He was weak, lack of appetite.     Past Medical History:  Diagnosis Date   Basal cell carcinoma    Bilateral Legs   CAD (coronary artery disease)    a.  stent to the RCA in 2001;  b.  Negative Myoview in January 2012;  c.  [place] 4/16:  dLM 20, small D1 50-70, D2 sub-total, oLCx 50-70, RCA stent ok, dPLB 90, EF 35-40%  >> DES to D2   Depression    History of echocardiogram    a.  Echo 4/16: Mild LVH, EF 13-24%, grade 1 diastolic dysfunction, normal wall motion, MAC   HLD (hyperlipidemia)    HTN (hypertension)    OSA (obstructive sleep apnea)    Vertigo     Past Surgical History:  Procedure Laterality Date   CATARACT EXTRACTION, BILATERAL     CORONARY STENT PLACEMENT  2001    RCA   LEFT HEART CATHETERIZATION WITH CORONARY ANGIOGRAM N/A 02/13/2012   Procedure: LEFT HEART CATHETERIZATION WITH CORONARY ANGIOGRAM;  Surgeon: Clois Treanor M Martinique, MD;  Location: [facility] C[facility] LAB;  Service: Cardiovascular;  Laterality: N/A;   LEFT HEART CATHETERIZATION WITH CORONARY ANGIOGRAM N/A 04/24/2014   Procedure: LEFT HEART CATHETERIZATION WITH CORONARY ANGIOGRAM;  Surgeon: [name], MD;  Location: [facility] CATH LAB;  Service: Cardiovascular;  Laterality: N/A;   TONSILLECTOMY       Home Medications:  Prior to Admission medications   Medication Sig Start Date End Date Taking? Authorizing Provider  ALPRAZolam Duanne Moron) 0.25 MG tablet Take 0.25 mg by mouth at bedtime as needed for sleep.   Yes [provider]  atorvastatin (LIPITOR) 40 MG tablet TAKE 1 TABLET (40  MG TOTAL) BY MOUTH AT BEDTIME. PLEASE KEEP UPCOMING APPT WITH DR. Johnsie Cancel IN AUGUST FOR FUTURE REFILLS. [place] YOU Patient taking differently: Take 40 mg by mouth in the morning.  09/27/18  Yes Josue Hector, MD  carvedilol (COREG) 12.5 MG tablet TAKE 1 TABLET (12.5 MG TOTAL) BY MOUTH 2 (TWO) TIMES DAILY. Patient taking differently: Take 12.5 mg by mouth 2 (two) times daily with a meal.  11/02/18  Yes Josue Hector, MD  clopidogrel (PLAVIX) 75 MG tablet TAKE 1 TABLET BY MOUTH EVERY DAY Patient taking differently: Take 75 mg by mouth in the morning.  10/12/18  Yes Josue Hector, MD  Glucosamine HCl (GLUCOSAMINE PO) Take 2 tablets by mouth daily.   Yes [provider]  isosorbide mononitrate (IMDUR) 60 MG 24 hr tablet Take 1 tablet (60 mg total) by mouth  daily. 09/14/18  Yes Josue Hector, MD  lisinopril (ZESTRIL) 5 MG tablet Take 1 tablet (5 mg total) by mouth daily. 09/14/18  Yes Josue Hector, MD  Magnesium Oxide 200 MG TABS Take 200 mg by mouth daily.   Yes [provider]  nitroGLYCERIN (NITROSTAT) 0.4 MG SL tablet PLACE 1 TABLET UNDER THE TONGUE EVERY 5 MINUTES AS NEEDED FOR CHEST PAIN. MAX OF 3 DOSES Patient taking differently: Place 0.4 mg under the tongue every 5 (five) minutes x 3 doses as needed for chest pain.  03/23/17  Yes Josue Hector, MD  omeprazole (PRILOSEC) 20 MG capsule Take 1 capsule (20 mg total) by mouth daily. Patient taking differently: Take 20 mg by mouth daily before breakfast.  12/21/18  Yes Josue Hector, MD  ondansetron (ZOFRAN) 4 MG tablet Take 1 tablet (4 mg total) by mouth every 8 (eight) hours as needed for nausea or vomiting. 07/01/19  Yes [name], [name], MD  venlafaxine XR (EFFEXOR-XR) 75 MG 24 hr capsule TAKE 1 CAPSULE (75 MG TOTAL) BY MOUTH DAILY WITH BREAKFAST. 06/20/19  Yes [name], [name], MD    Inpatient Medications: Scheduled Meds:  aspirin  150 mg Rectal Daily   enoxaparin (LOVENOX) injection  40 mg Subcutaneous Q24H   insulin aspart  0-6 Units Subcutaneous Q6H   metoCLOPramide (REGLAN) injection  5 mg Intravenous Once   pantoprazole (PROTONIX) IV  40 mg Intravenous Q12H   polyethylene glycol-electrolytes  4,000 mL Oral Once   sodium chloride flush  3 mL Intravenous Once   sodium chloride flush  3 mL Intravenous Q12H   Continuous Infusions:  dextrose 5% lactated ringers with KCl 20 mEq/L 75 mL/hr at 07/25/19 0600   methocarbamol (ROBAXIN) IV     PRN Meds: acetaminophen **OR** acetaminophen, albuterol, labetalol, LORazepam, methocarbamol (ROBAXIN) IV, morphine injection, ondansetron (ZOFRAN) IV  Allergies:   No Known Allergies  Social History:   Social History   Socioeconomic History   Marital status: Divorced    Spouse name: Not on file   Number of children: Not on file   Years  of education: Not on file   Highest education level: Not on file  Occupational History   Not on file  Tobacco Use   Smoking status: Former Smoker    Packs/day: 1.00    Years: 25.00    Pack years: 25.00    Types: Cigarettes    Quit date: 01/14/2000    Years since quitting: 19.5   Smokeless tobacco: Never Used  Vaping Use   Vaping Use: Never used  Substance and Sexual Activity   Alcohol use: Yes  Alcohol/week: 3.0 standard drinks    Types: 2 Shots of liquor, 1 Cans of beer per week   Drug use: No   Sexual activity: Not Currently  Other Topics Concern   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   Social Determinants of Health   Financial Resource Strain:    Difficulty of Paying Living Expenses:   Food Insecurity:    Worried About Charity fundraiser in the Last Year:    Arboriculturist in the Last Year:   Transportation Needs:    Film/video editor (Medical):    Lack of Transportation (Non-Medical):   Physical Activity:    Days of Exercise per Week:    Minutes of Exercise per Session:   Stress:    Feeling of Stress :   Social Connections:    Frequency of Communication with Friends and Family:    Frequency of Social Gatherings with Friends and Family:    Attends Religious Services:    Active Member of Clubs or Organizations:    Attends Music therapist:    Marital Status:   Intimate Partner Violence:    Fear of Current or Ex-Partner:    Emotionally Abused:    Physically Abused:    Sexually Abused:     Family History:    Family History  Problem Relation Age of Onset   Cirrhosis Mother    Alcohol abuse Mother    Heart disease Father    Hypertension Father    Peripheral vascular disease Father    Depression Sister    Heart attack Neg Hx    Stroke Neg Hx      ROS:  Please see the history of present illness.  General:no colds or fevers, +  weight changes with decreased appetite Skin:no rashes or ulcers HEENT:no blurred vision, no congestion CV:see HPI PUL:see HPI GI:no diarrhea constipation or melena, no indigestion GU:no hematuria, no dysuria MS:no joint pain, no claudication Neuro:no syncope, no lightheadedness Endo:no diabetes, no thyroid disease  All other ROS reviewed and negative.     Physical Exam/Data:   Vitals:   07/24/19 1538 07/24/19 1700 07/24/19 1956 07/25/19 0402  BP: 130/83  120/80 122/81  Pulse: 87  84 83  Resp: _0 Temp: 98.7 F (37.1 C)  98.3 F (36.8 C) 98.1 F (36.7 C)  TempSrc:    Oral  SpO2: 95%  95% 96%  Weight:  76.1 kg    Height:  _1  (1.676 m)      Intake/Output Summary (Last 24 hours) at 07/25/2019 1116 Last data filed at 07/24/2019 1859 Gross per 24 hour  Intake --  Output 1250 ml  Net -1250 ml   Last 3 Weights 07/24/2019 07/12/2019 07/01/2019  Weight (lbs) 167 lb 12.8 oz 173 lb 4 oz 178 lb 8 oz  Weight (kg) 76.114 kg 78.586 kg 80.967 kg     Body mass index is 27.08 kg/m.  General:  Well nourished, well developed, in no acute distress HEENT: normal Lymph: no adenopathy Neck: no JVD Endocrine:  No thryomegaly Vascular: No carotid bruits; pedal pulses 2+ bilaterally  Cardiac:  normal S1, S2; RRR; no murmur gallup rub or click Lungs:  clear to auscultation bilaterally, no wheezing, rhonchi or rales  Abd: soft, nontender, + pressure to palpation, no hepatomegaly  Ext: no edema Musculoskeletal:  No  deformities, BUE and BLE strength normal and equal Skin: warm and dry  Neuro:  Alert and oriented X 3 MAE follows commands , no focal abnormalities noted Psych:  Normal affect   Relevant CV Studies: Echo pending.   Laboratory Data:  High Sensitivity Troponin:  No results for input(s): TROPONINIHS in the last 720 hours.   Chemistry Recent Labs  Lab 07/23/19 0957 07/24/19 0144 07/25/19 0246  NA 138 139 142  K 4.0 4.5 4.5  CL 95* 99 104  CO2 _0 GLUCOSE  221* 151* 128*  BUN 28* 27* 20  CREATININE 1.65* 1.14 1.10  CALCIUM 10.0 9.2 9.1  GFRNONAA 41* >60 >60  GFRAA 47* >60 >60  ANIONGAP 19* 11 10    Recent Labs  Lab 07/23/19 0957 07/25/19 0246  PROT 7.5 6.0*  ALBUMIN 3.4* 2.8*  AST 30 23  ALT 42 32  ALKPHOS 103 72  BILITOT 1.2 1.1   Hematology Recent Labs  Lab 07/23/19 0957 07/24/19 0144 07/25/19 0246  WBC 17.1*   17.7* 9.8 6.8  RBC 5.59   5.59 5.02 4.83  HGB 16.6   16.4 14.8 14.2  HCT 48.7   48.2 43.7 43.4  MCV 87.1   86.2 87.1 89.9  MCH 29.7   29.3 29.5 29.4  MCHC 34.1   34.0 33.9 32.7  RDW 12.8   12.9 13.1 13.1  PLT 413*   385 290 240   BNP Recent Labs  Lab 07/25/19 0246  BNP 54.0    DDimer No results for input(s): DDIMER in the last 168 hours.   Radiology/Studies:  DG Abd 1 View  Result Date: 07/25/2019 CLINICAL DATA:  Bowel obstruction. EXAM: ABDOMEN - 1 VIEW COMPARISON:  The CT abdomen pelvis-07/23/2019 FINDINGS: There is moderate to marked gaseous distention of multiple loops of predominantly small bowel with index loop of small bowel in the left mid hemiabdomen measuring 3.5 cm in diameter. A small amount of air is seen within the ascending and transverse colon however otherwise series a conspicuous paucity of distal colonic gas. No supine evidence of pneumoperitoneum. No pneumatosis or portal venous gas Enteric tip and side port projected the expected location of in by the stomach Limited visualization of the lower thorax demonstrates mild elevation of the right hemidiaphragm. A punctate granuloma overlies the left lower lobe with associated partially calcified left hilar lymph nodes, the sequela of previous granulomatous infection. Excreted contrast is seen within the urinary bladder. Mild to moderate multilevel lumbar spine DDD is suspected though incompletely evaluated. IMPRESSION: 1. Findings most suggestive of small-bowel obstruction though note, a distal colonic lesion was identified on abdominal CT  performed 07/23/2019. 2. Enteric tube tip and side port project over the expected location of the mid body of the stomach. Electronically Signed   By: Sandi Mariscal M.D.   On: 07/25/2019 07:08   CT CHEST W CONTRAST  Result Date: 07/24/2019 CLINICAL DATA:  Obstructing sigmoid colon mass. Evaluate for metastatic disease. EXAM: CT CHEST WITH CONTRAST TECHNIQUE: Multidetector CT imaging of the chest was performed during intravenous contrast administration. CONTRAST:  75m OMNIPAQUE IOHEXOL 300 MG/ML  SOLN COMPARISON:  CT abdomen/pelvis 07/23/2019 FINDINGS: Cardiovascular: The heart is normal in size. No pericardial effusion. The aorta is normal in caliber. No dissection. Minimal atherosclerotic calcification at the aortic arch. Branch vessels are patent. Advanced three-vessel coronary artery calcifications are noted. Mediastinum/Nodes: No mediastinal or hilar mass or lymphadenopathy. The esophagus is grossly normal. There is an NG tube coursing down  the esophagus and into the stomach. Lungs/Pleura: No worrisome pulmonary lesions to suggest metastatic disease no acute pulmonary findings. No pleural effusions. Streaky right basilar atelectasis is noted. Upper Abdomen: Persistent dilated small bowel and colon. No free air. The gallbladder is mildly distended. No hepatic lesions are identified. Musculoskeletal: No significant bony findings. IMPRESSION: 1. No CT findings for pulmonary metastatic disease. 2. No mediastinal or hilar mass or adenopathy. 3. Advanced three-vessel coronary artery calcifications. 4. Persistent dilated small bowel and colon. 5. Aortic atherosclerosis. Aortic Atherosclerosis (ICD10-I70.0). Electronically Signed   By: Marijo Sanes M.D.   On: 07/24/2019 13:06   CT ABDOMEN PELVIS W CONTRAST  Result Date: 07/23/2019 CLINICAL DATA:  Abdominal distension and back pain.  Bloody stools. EXAM: CT ABDOMEN AND PELVIS WITH CONTRAST TECHNIQUE: Multidetector CT imaging of the abdomen and pelvis was  performed using the standard protocol following bolus administration of intravenous contrast. CONTRAST:  11m OMNIPAQUE IOHEXOL 300 MG/ML  SOLN COMPARISON:  None. FINDINGS: Lower chest: Streaky right basilar scarring changes are noted. There is a calcified granuloma noted at the left lung base. No worrisome pulmonary lesions or pleural effusion. The heart is normal in size. No pericardial effusion. Coronary artery calcifications are noted. The distal esophagus is grossly normal. Hepatobiliary: Very small low-attenuation lesion at the hepatic dome is likely a benign cyst. No worrisome hepatic lesions are identified. The gallbladder is unremarkable. No common bile duct dilatation. Pancreas: No mass, inflammation or ductal dilatation. Spleen: Normal size. A few small scattered calcified granulomas are noted. Adrenals/Urinary Tract: The adrenal glands are normal. No renal lesions or hydronephrosis.  Bladder is unremarkable. Stomach/Bowel: The stomach is mildly distended with fluid and air. The small bowel is dilated and demonstrates scattered air-fluid levels. The colon is also dilated with fluid and air down to an obstructing enhancing lesion in the upper sigmoid colon near the sigmoid colon descending colon junction region. This measures approximately 5 cm and is highly suspicious for colonic neoplasm causing low colonic obstruction. Recommend surgical consultation. Vascular/Lymphatic: Advanced atherosclerotic calcifications involving the aorta and iliac arteries. No aneurysm. The branch vessels are patent. The major venous structures are patent. There are small scattered subcentimeter retroperitoneal lymph nodes. No mass or overt adenopathy. No pelvic adenopathy. I do not see any enlarged lymph nodes in the sigmoid mesocolon or in the pelvis to suggest locoregional adenopathy. Reproductive: The prostate gland and seminal vesicles are unremarkable. Other: No free pelvic fluid collections. No inguinal mass or  adenopathy. Musculoskeletal: No significant bony findings. IMPRESSION: 1. 5 cm obstructing enhancing lesion in the upper sigmoid colon near the sigmoid colon descending colon junction region worrisome for colonic neoplasm causing low colonic obstruction. Fairly dilated colon and small bowel above the lesion. Recommend surgical consultation. 2. No findings for locoregional adenopathy or distant metastatic disease. 3. Advanced atherosclerotic calcifications involving the aorta and iliac arteries. 4. Aortic atherosclerosis. Aortic Atherosclerosis (ICD10-I70.0). Electronically Signed   By: PMarijo SanesM.D.   On: 07/23/2019 13:47   DG Chest Port 1 View  Result Date: 07/23/2019 CLINICAL DATA:  [facility] to abdomen x 1 month. States that he has had increased gas and dark stools for several weeks. Has just developed nausea and vomiting x 1 day. Alert EXAM: PORTABLE CHEST - 1 VIEW COMPARISON:  02/10/2012 FINDINGS: Linear scarring or subsegmental atelectasis in the right mid lung. Stable calcified granuloma, left lower lung. No evidence of edema. Heart size and mediastinal contours are within  normal limits. No effusion.  No pneumothorax. Visualized bones unremarkable. IMPRESSION: New linear scarring or atelectasis, right mid lung. Otherwise negative. Electronically Signed   By: Lucrezia Europe M.D.   On: 07/23/2019 12:21   [place] EKG SITE RITE  Result Date: 07/24/2019 If Site Rite image not attached, placement could not be confirmed due to current cardiac rhythm.       NO CHEST PAIN    Assessment and Plan:   Pre-op Eval for colonoscopy and possible stent.  plavix on hold and last stent to CAD 2016 with neg nuc studies since. Last nuc 2018  With hx old MI and no ischemia EF normal. Pt without angina and meeting 4 METS of activity prior to this illness acceptable risk for surgery.  Dr. Johnsie Cancel to see.  His BB is on hold as well.  Would recommend tele will  re-order.  CAD with last cath 2016 with stent placed to second diag.  He did have other disease with LAD 50-70% stenosis.  20% LM  3.  SBO and mass, for colonoscopy tomorrow. And prob stent. 4.  HTN stable outpt meds on hold.  5.  HLD on low dose statin does not tolerate higher dose.          For questions or updates, please contact [place] Please consult [email or web address] for contact info under    Signed, Cecilie Kicks, NP  07/25/2019 11:16 AM  Patient examined chart reviewed Discussed care with patient and PA. Exam with elderly male in no distress NG tube in place Clear lungs no bruits, no murmur Abdomen mildly distended and typanitic Trace edema with stasis palpable pedal pulses. 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 Last myovue non ischemic 2018 but has had stent to RCA/Diagonal with residual disease in LAD (moderate) and PLB distal RCA. Hold Plavix for colonoscopy and colectomy will follow. No need for telemetry preoperatively   Jenkins Rouge MD [facility]

## 2019-07-25 NOTE — Progress Notes (Signed)
PHARMACY - TOTAL PARENTERAL NUTRITION CONSULT NOTE   Indication: Small bowel obstruction  Patient Measurements: Height: 5\' 6"  (167.6 cm) Weight: 76.1 kg (167 lb 12.8 oz) IBW/kg (Calculated) : 63.8 TPN AdjBW (KG): 76.1 Body mass index is 27.08 kg/m. Usual Weight:   Assessment: 33 yom presenting 7/10 with low back/generalized abdominal pain x 2 months, decreased appetite, N/V, loose stools - noted progressively worsening. 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 General surgery consulted with plans for colon resection. GI consult requested to consider stent placement to facilitate bowel preparation prior to colon resection with primary anastomosis +/- diverting loop ileostomy. If not candidate for stent and cannot undergo bowel prep, will require Hartman's procedure. Pharmacy consulted to initiate TPN. Noted 15lb wt loss over last 1.5 months.  PICC line was refused by patient and patient is still undergoing work up by GI.   Glucose / Insulin: No hx DM. A1c 6.2%. CBGs  109 - 145. Utilized 1 unit SSI / 24 hrs.  Electrolytes: Phos 2.7. Mg wnl.  Renal: SCr down to 1.11 - at baseline. BUN wnl. UOP 0.5 ml/kg/hr.  LFTs / TGs: LFTs / Tbili WNL. No TG yet Prealbumin / albumin: Prealbumin 13.3; albumin 2.8 Intake / Output; MIVF: MIVF: D5LR+20K at 75 ml/hr. 425 ml / 24 hr NGT output. LBM 7/11 - 11 stool occurrences noted  GI Imaging:  7/10 CT abd pelvis - worrisome for colonic neoplasm causing low colonic obstruction, fairly dilated colon and small bowel above the lesion 7/11 CT chest - no findings for pulmonary metastatic dz, no mediastinal or hilar mass or adenopathy, persistent dilated small bowel and colon 7/12 DG abd - findings most suggestive of SBO  Surgeries / Procedures:   Central access: none - PICC refused by patient pending discussion with primary team  TPN start date: 07/25/2019  Nutritional Goals: RD recommendation on pending  Current Nutrition:  NPO   Plan:  Spoke with Dr.  Loleta Books and will not start TPN today and will follow up with Dr. Loleta Books and Dr. Carlean Purl tomorrow on plans for surgery and need to TPN as appropriate.  Cristela Felt, PharmD Clinical Pharmacist  07/25/2019 8:25 AM

## 2019-07-25 NOTE — Progress Notes (Addendum)
[facility] Progress Note     Subjective: CC-  Less bloating today. States that abdomen is sore but he really does not have much pain. NG tube with 425cc out last 24hr. Passing flatus and states that he has had several loose BMs. States that his bowels are moving almost constantly.  Objective: Vital signs in last 24 hours: Temp:  [98.1 F (36.7 C)-98.7 F (37.1 C)] 98.1 F (36.7 C) (07/12 0402) Pulse Rate:  [83-87] 83 (07/12 0402) Resp:  [17-18] 17 (07/12 0402) BP: (120-130)/(80-83) 122/81 (07/12 0402) SpO2:  [95 %-96 %] 96 % (07/12 0402) Weight:  [76.1 kg] 76.1 kg (07/11 1700) Last BM Date: 07/24/19  Intake/Output from previous day: 07/11 0701 - 07/12 0700 In: 0  Out: 1250 [Urine:825; Emesis/NG output:425] Intake/Output this shift: No intake/output data recorded.  PE: Gen:  Alert, NAD, pleasant HEENT: EOM's intact, pupils equal and round Card:  RRR Pulm:  CTAB, no W/R/R, rate and effort normal Abd: Soft, mild distension, +BS, no HSM, no hernia, nontender Psych: A&Ox Skin: no rashes noted, warm and dry   Lab Results:  Recent Labs    07/24/19 0144 07/25/19 0246  WBC 9.8 6.8  HGB 14.8 14.2  HCT 43.7 43.4  PLT 290 240   BMET Recent Labs    07/24/19 0144 07/25/19 0246  NA 139 142  K 4.5 4.5  CL 99 104  CO2 29 28  GLUCOSE 151* 128*  BUN 27* 20  CREATININE 1.14 1.10  CALCIUM 9.2 9.1   PT/INR Recent Labs    07/24/19 0937  LABPROT 13.8  INR 1.1   CMP     Component Value Date/Time   NA 142 07/25/2019 0246   K 4.5 07/25/2019 0246   CL 104 07/25/2019 0246   CO2 28 07/25/2019 0246   GLUCOSE 128 (H) 07/25/2019 0246   GLUCOSE 118 (H) 12/08/2005 0741   BUN 20 07/25/2019 0246   CREATININE 1.10 07/25/2019 0246   CREATININE 1.11 04/02/2015 1514   CALCIUM 9.1 07/25/2019 0246   PROT 6.0 (L) 07/25/2019 0246   PROT 6.6 03/26/2016 0736   ALBUMIN 2.8 (L) 07/25/2019 0246   ALBUMIN 4.0 03/26/2016 0736   AST 23 07/25/2019 0246   ALT 32  07/25/2019 0246   ALKPHOS 72 07/25/2019 0246   BILITOT 1.1 07/25/2019 0246   BILITOT 0.3 03/26/2016 0736   GFRNONAA >60 07/25/2019 0246   GFRAA >60 07/25/2019 0246   Lipase     Component Value Date/Time   LIPASE 22 07/23/2019 0957       Studies/Results: DG Abd 1 View  Result Date: 07/25/2019 CLINICAL DATA:  Bowel obstruction. EXAM: ABDOMEN - 1 VIEW COMPARISON:  The CT abdomen pelvis-07/23/2019 FINDINGS: There is moderate to marked gaseous distention of multiple loops of predominantly small bowel with index loop of small bowel in the left mid hemiabdomen measuring 3.5 cm in diameter. A small amount of air is seen within the ascending and transverse colon however otherwise series a conspicuous paucity of distal colonic gas. No supine evidence of pneumoperitoneum. No pneumatosis or portal venous gas Enteric tip and side port projected the expected location of in by the stomach Limited visualization of the lower thorax demonstrates mild elevation of the right hemidiaphragm. A punctate granuloma overlies the left lower lobe with associated partially calcified left hilar lymph nodes, the sequela of previous granulomatous infection. Excreted contrast is seen within the urinary bladder. Mild to moderate multilevel lumbar spine DDD is suspected though incompletely evaluated. IMPRESSION: 1.  Findings most suggestive of small-bowel obstruction though note, a distal colonic lesion was identified on abdominal CT performed 07/23/2019. 2. Enteric tube tip and side port project over the expected location of the mid body of the stomach. Electronically Signed   By: Sandi Mariscal M.D.   On: 07/25/2019 07:08   CT CHEST W CONTRAST  Result Date: 07/24/2019 CLINICAL DATA:  Obstructing sigmoid colon mass. Evaluate for metastatic disease. EXAM: CT CHEST WITH CONTRAST TECHNIQUE: Multidetector CT imaging of the chest was performed during intravenous contrast administration. CONTRAST:  60mL OMNIPAQUE IOHEXOL 300 MG/ML  SOLN  COMPARISON:  CT abdomen/pelvis 07/23/2019 FINDINGS: Cardiovascular: The heart is normal in size. No pericardial effusion. The aorta is normal in caliber. No dissection. Minimal atherosclerotic calcification at the aortic arch. Branch vessels are patent. Advanced three-vessel coronary artery calcifications are noted. Mediastinum/Nodes: No mediastinal or hilar mass or lymphadenopathy. The esophagus is grossly normal. There is an NG tube coursing down the esophagus and into the stomach. Lungs/Pleura: No worrisome pulmonary lesions to suggest metastatic disease no acute pulmonary findings. No pleural effusions. Streaky right basilar atelectasis is noted. Upper Abdomen: Persistent dilated small bowel and colon. No free air. The gallbladder is mildly distended. No hepatic lesions are identified. Musculoskeletal: No significant bony findings. IMPRESSION: 1. No CT findings for pulmonary metastatic disease. 2. No mediastinal or hilar mass or adenopathy. 3. Advanced three-vessel coronary artery calcifications. 4. Persistent dilated small bowel and colon. 5. Aortic atherosclerosis. Aortic Atherosclerosis (ICD10-I70.0). Electronically Signed   By: Marijo Sanes M.D.   On: 07/24/2019 13:06   CT ABDOMEN PELVIS W CONTRAST  Result Date: 07/23/2019 CLINICAL DATA:  Abdominal distension and back pain.  Bloody stools. EXAM: CT ABDOMEN AND PELVIS WITH CONTRAST TECHNIQUE: Multidetector CT imaging of the abdomen and pelvis was performed using the standard protocol following bolus administration of intravenous contrast. CONTRAST:  41mL OMNIPAQUE IOHEXOL 300 MG/ML  SOLN COMPARISON:  None. FINDINGS: Lower chest: Streaky right basilar scarring changes are noted. There is a calcified granuloma noted at the left lung base. No worrisome pulmonary lesions or pleural effusion. The heart is normal in size. No pericardial effusion. Coronary artery calcifications are noted. The distal esophagus is grossly normal. Hepatobiliary: Very small  low-attenuation lesion at the hepatic dome is likely a benign cyst. No worrisome hepatic lesions are identified. The gallbladder is unremarkable. No common bile duct dilatation. Pancreas: No mass, inflammation or ductal dilatation. Spleen: Normal size. A few small scattered calcified granulomas are noted. Adrenals/Urinary Tract: The adrenal glands are normal. No renal lesions or hydronephrosis.  Bladder is unremarkable. Stomach/Bowel: The stomach is mildly distended with fluid and air. The small bowel is dilated and demonstrates scattered air-fluid levels. The colon is also dilated with fluid and air down to an obstructing enhancing lesion in the upper sigmoid colon near the sigmoid colon descending colon junction region. This measures approximately 5 cm and is highly suspicious for colonic neoplasm causing low colonic obstruction. Recommend surgical consultation. Vascular/Lymphatic: Advanced atherosclerotic calcifications involving the aorta and iliac arteries. No aneurysm. The branch vessels are patent. The major venous structures are patent. There are small scattered subcentimeter retroperitoneal lymph nodes. No mass or overt adenopathy. No pelvic adenopathy. I do not see any enlarged lymph nodes in the sigmoid mesocolon or in the pelvis to suggest locoregional adenopathy. Reproductive: The prostate gland and seminal vesicles are unremarkable. Other: No free pelvic fluid collections. No inguinal mass or adenopathy. Musculoskeletal: No significant bony findings. IMPRESSION: 1. 5 cm obstructing enhancing lesion in  the upper sigmoid colon near the sigmoid colon descending colon junction region worrisome for colonic neoplasm causing low colonic obstruction. Fairly dilated colon and small bowel above the lesion. Recommend surgical consultation. 2. No findings for locoregional adenopathy or distant metastatic disease. 3. Advanced atherosclerotic calcifications involving the aorta and iliac arteries. 4. Aortic  atherosclerosis. Aortic Atherosclerosis (ICD10-I70.0). Electronically Signed   By: Marijo Sanes M.D.   On: 07/23/2019 13:47   DG Chest Port 1 View  Result Date: 07/23/2019 CLINICAL DATA:  Patient arrived by [facility] complaining of lower back pain radiating through to abdomen x 1 month. States that he has had increased gas and dark stools for several weeks. Has just developed nausea and vomiting x 1 day. Alert EXAM: PORTABLE CHEST - 1 VIEW COMPARISON:  02/10/2012 FINDINGS: Linear scarring or subsegmental atelectasis in the right mid lung. Stable calcified granuloma, left lower lung. No evidence of edema. Heart size and mediastinal contours are within normal limits. No effusion.  No pneumothorax. Visualized bones unremarkable. IMPRESSION: New linear scarring or atelectasis, right mid lung. Otherwise negative. Electronically Signed   By: Lucrezia Europe M.D.   On: 07/23/2019 12:21   [place] EKG SITE RITE  Result Date: 07/24/2019 If Site Rite image not attached, placement could not be confirmed due to current cardiac rhythm.   Anti-infectives: Anti-infectives (From admission, onward)   None       Assessment/Plan HTN HLD CAD s/p PCI 2001 and 2016 (on plavix, last dose 7/9) - hold plavix. Last ECHO in 2017, discussed with TR[facility] and well will ask cardiology to see DM - A1c 6.2 OSA Malnutrition - prealbumin 13.3 (7/12). Will see if he tolerates PO prior to starting TPN AKI - Cr 1.1, resolved  Partially obstructing sigmoid lesion/mass - CT abd/pelvis with no findings for locoregional adenopathy or distant metastatic disease - CT chest negative for metastatic disease - CEA pending - If colon adenocarcinoma and no metastatic disease but persistent obstructive like sxs, could entertain stent placement for prep, complete scope and possibly resection/anastomosis +/- diverting loop ileostomy. If not stent candidate may need Hartmann's procedure as colon won't be able to be prepped and will have large column of  stool proximally  ID - none FEN - IVF, NPO/NGT to LIWS VTE - SCDs, lovenox Foley - none Follow up - TBD  Plan - Discussed with GI. Patient is having bowel function so well will attempt PO bowel prep today and plan for colonoscopy tomorrow. If he does not tolerate prep will hopefully have colonic stent available for colonoscopy tomorrow.   LOS: 2 days    [facility] 07/25/2019, 8:15 AM Please see Amion for pager number during day hours 7:00am-4:30pm

## 2019-07-25 NOTE — Progress Notes (Addendum)
Initial Nutrition Assessment  DOCUMENTATION CODES:   Severe malnutrition in context of acute illness/injury  INTERVENTION:   - TPN per pharmacy, recommend initiating TPN within next 24 hours given severity of malnutrition  - RD will monitor for diet advancement and provide supplements as appropriate  Monitor magnesium, potassium, and phosphorus daily for at least 3 days, MD to replete as needed, as pt is at risk for refeeding syndrome given severe acute malnutrition, very poor PO intake x 2 weeks PTA  NUTRITION DIAGNOSIS:   Severe Malnutrition related to acute illness (SBO) as evidenced by moderate fat depletion, moderate muscle depletion, percent weight loss, energy intake < or equal to 50% for > or equal to 5 days (9.1% weight loss in less than 1 month).  GOAL:   Patient will meet greater than or equal to 90% of their needs  MONITOR:   Diet advancement, Labs, Weight trends, I & O's  REASON FOR ASSESSMENT:   Consult New TPN/TNA  ASSESSMENT:   74 year old male who presented on 7/10 with lower back pain and abdominal pain x 1 month, N/V. PMH of CAD, HTN, HLD, T2DM. CT scan revealing a partially obstructing sigmoid lesion/mass. Surgery consulted and NGT placed for decompression.   RD consulted for new TPN. Noted pt refused PICC line placement yesterday due to wanting to discuss plan of care with care team. Per pharmacy note, no plan to start TPN today.  NG tube in place, currently clamped for administering prep. Per Surgery and GI, plan is for bowel prep through NG tube today and plan for colonoscopy tomorrow. If pt does not tolerate prep, plan is for colonic stent.  Spoke with pt at bedside. RN in room administering bowel prep at time of RD visit. Pt reports decreased appetite and PO intake for 3 weeks PTA. When decreased appetite began, pt reports trying to eat eggs, beef tips, and noodles but "that did not go well." Pt reports that over the last 2 weeks, all he has been able  to consume is Gatorade and Boost. Pt reports that at most, he would consume 2 bottles of Gatorade (12 oz each) and 2 Boost supplements daily. There were several days over the last 2 weeks that pt did not take any POs.  Pt reports that he began vomiting on Friday. Pt denies any N/V prior to this time.  Pt endorses significant weight loss. Pt reports UBW as 180 lbs and that he last weighed this 1-2 months ago. RD obtained bed weight at time of visit: 162.2 lbs. Entered this weight into pt's chart.  Reviewed weight history in chart. Pt with a 7.4 kg (16.3 lb) weight loss since 07/01/19. This is a 9.1% weight loss in less than 1 month which is severe and significant for timeframe. Pt meets criteria for severe acute malnutrition.  Medications reviewed and include: SSI q 6 hours, IV protonix, IV abx IVF: D5 in LR with KCl @ 75 ml/hr  Labs reviewed. CBG's: 105-145 x 24 hours  UOP: 825 ml x 24 hours NGT output: 425 ml x 24 hours  NUTRITION - FOCUSED PHYSICAL EXAM:    Most Recent Value  Orbital Region Moderate depletion  Upper Arm Region Moderate depletion  Thoracic and Lumbar Region No depletion  Buccal Region Mild depletion  Temple Region Mild depletion  Clavicle Bone Region Moderate depletion  Clavicle and Acromion Bone Region Moderate depletion  Scapular Bone Region Mild depletion  Dorsal Hand Moderate depletion  Patellar Region Mild depletion  Anterior Thigh Region  Mild depletion  Posterior Calf Region Mild depletion  Edema (RD Assessment) None  Hair Reviewed  Eyes Reviewed  Mouth Reviewed  Skin Reviewed  Nails Reviewed       Diet Order:   Diet Order            Diet NPO time specified Except for: Ice Chips  Diet effective now                 EDUCATION NEEDS:   Education needs have been addressed  Skin:  Skin Assessment: Reviewed RN Assessment  Last BM:  07/24/19 type 7  Height:   Ht Readings from Last 1 Encounters:  07/24/19 5\' 6"  (1.676 m)    Weight:   Wt  Readings from Last 1 Encounters:  07/25/19 73.6 kg    Ideal Body Weight:  64.5 kg  BMI:  Body mass index is 26.18 kg/m.  Estimated Nutritional Needs:   Kcal:  1850-2050  Protein:  90-110 grams  Fluid:  1.8-2.0 L    Gaynell Face, MS, RD, LDN Inpatient Clinical Dietitian Please see AMiON for contact information.

## 2019-07-25 NOTE — Evaluation (Addendum)
Physical Therapy Evaluation/ discharge Patient Details Name: Roy [name] MRN: [id number] DOB: 1945/11/08 Today's Date: 07/25/2019   History of Present Illness  74 yo admitted with abdominal pain with partial SBO. 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 Pt on bedpan on arrival due to fear of incontinent stool but educated for need to not maintain bedpan to prevent pressure wounds and that transition to toilet and BSC with nursing assist recommended. Pt moving well in room with ability to perform all transfers and gait without assist. Pt educated to walk and maintain transfers acutely to prevent deconditioning. Pt able to verbalize understanding and agreeable to no further needs. Will sign off.      Follow Up Recommendations No PT follow up    Equipment Recommendations  None recommended by PT    Recommendations for Other Services       Precautions / Restrictions Precautions Precautions: Other (comment) Precaution Comments: NG tube      Mobility  Bed Mobility Overal bed mobility: Independent                Transfers Overall transfer level: Independent                  Ambulation/Gait Ambulation/Gait assistance: Supervision Gait Distance (Feet): 400 Feet Assistive device: None Gait Pattern/deviations: WFL(Within Functional Limits)   Gait velocity interpretation: >2.62 ft/sec, indicative of community ambulatory General Gait Details: pt with supervision for IV pole with pt making 10 laps in room as did not want to go in hallway in fear of need for BM  Stairs            Wheelchair Mobility    Modified Rankin (Stroke Patients Only)       Balance Overall balance assessment: No apparent balance deficits (not formally assessed)                                           Pertinent  Vitals/Pain Pain Assessment: No/denies pain    Home Living Family/patient expects to be discharged to:: Private residence Living Arrangements: Alone   Type of Home: House Home Access: Stairs to enter Entrance Stairs-Rails: Psychiatric nurse of Steps: 6 Home Layout: One level Home Equipment: None Additional Comments: 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 meals in hospital    Prior Function Level of Independence: Independent               Hand Dominance        Extremity/Trunk Assessment   Upper Extremity Assessment Upper Extremity Assessment: Overall WFL for tasks assessed    Lower Extremity Assessment Lower Extremity Assessment: Overall WFL for tasks assessed    Cervical / Trunk Assessment Cervical / Trunk Assessment: Normal  Communication   Communication: No difficulties  Cognition Arousal/Alertness: Awake/alert Behavior During Therapy: WFL for tasks assessed/performed Overall Cognitive Status: Within Functional Limits for tasks assessed                                        General Comments      Exercises     Assessment/Plan    PT Assessment Patent does not need  any further PT services  PT Problem List         PT Treatment Interventions      PT Goals (Current goals can be found in the Care Plan section)  Acute Rehab PT Goals Patient Stated Goal: return to home and work PT Goal Formulation: All assessment and education complete, DC therapy    Frequency     Barriers to discharge        Co-evaluation               AM-PAC PT "6 Clicks" Mobility  Outcome Measure Help needed turning from your back to your side while in a flat bed without using bedrails?: None Help needed moving from lying on your back to sitting on the side of a flat bed without using bedrails?: None Help needed moving to and from a bed to a chair (including a  wheelchair)?: None Help needed standing up from a chair using your arms (e.g., wheelchair or bedside chair)?: None Help needed to walk in hospital room?: None Help needed climbing 3-5 steps with a railing? : None 6 Click Score: 24    End of Session   Activity Tolerance: Patient tolerated treatment well Patient left: in chair;with call bell/phone within reach;with chair alarm set Nurse Communication: Mobility status PT Visit Diagnosis: Other abnormalities of gait and mobility (R26.89)    Time: 1025-1046 PT Time Calculation (min) (ACUTE ONLY): 21 min   Charges:   PT Evaluation $PT Eval Moderate Complexity: 1 Mod          Desira Alessandrini P, PT Acute Rehabilitation Services Pager: [phone] Office: Edith Endave B Therma Lasure 07/25/2019, 1:40 PM

## 2019-07-25 NOTE — Progress Notes (Addendum)
   Patient Name: Roy [name] Date of Encounter: 07/25/2019, 10:03 AM    Subjective  Passing flatus and having loose stools No pain   Objective  BP 122/81 (BP Location: Left Arm)   Pulse 83   Temp 98.1 F (36.7 C) (Oral)   Resp 17   Ht 5\' 6"  (1.676 m)   Wt 76.1 kg   SpO2 96%   BMI 27.08 kg/m  NAD NG tube tan-brown fluid 425 cc out x 24 hrs Abdomen is mod distended and soft and NT - NG suction going    CBC Latest Ref Rng & Units 07/25/2019 07/24/2019 07/23/2019  WBC 4.0 - 10.5 K/uL 6.8 9.8 17.1(H)  Hemoglobin 13.0 - 17.0 g/dL 14.2 14.8 16.6  Hematocrit 39 - 52 % 43.4 43.7 48.7  Platelets 150 - 400 K/uL 240 290 413(H)   Lab Results  Component Value Date   CREATININE 1.10 07/25/2019   BUN 20 07/25/2019   NA 142 07/25/2019   K 4.5 07/25/2019   CL 104 07/25/2019   CO2 28 07/25/2019    DG Abd 1 View CLINICAL DATA:  Bowel obstruction.  EXAM: ABDOMEN - 1 VIEW  COMPARISON:  The CT abdomen pelvis-07/23/2019  FINDINGS: There is moderate to marked gaseous distention of multiple loops of predominantly small bowel with index loop of small bowel in the left mid hemiabdomen measuring 3.5 cm in diameter. A small amount of air is seen within the ascending and transverse colon however otherwise series a conspicuous paucity of distal colonic gas.  No supine evidence of pneumoperitoneum. No pneumatosis or portal venous gas  Enteric tip and side port projected the expected location of in by the stomach  Limited visualization of the lower thorax demonstrates mild elevation of the right hemidiaphragm. A punctate granuloma overlies the left lower lobe with associated partially calcified left hilar lymph nodes, the sequela of previous granulomatous infection.  Excreted contrast is seen within the urinary bladder.  Mild to moderate multilevel lumbar spine DDD is suspected though incompletely evaluated.  IMPRESSION: 1. Findings most suggestive of small-bowel  obstruction though note, a distal colonic lesion was identified on abdominal CT performed 07/23/2019. 2. Enteric tube tip and side port project over the expected location of the mid body of the stomach.  Electronically Signed   By: Sandi Mariscal M.D.   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  If unsuccessful I will place a colonic stent - we have to order one and hopefully get overnight.  Will schedule for tomorrow with plans to see if needed (and if stent arrives)   So clamping NG tube and administering prep  D/w Jerene Pitch Meuth PA-C   2) NPO   At risk for prolonged NPO though maybe not if we can get surgery in next 48 hrs or so.  I would hold off on TPN for now - get dietitian input as pharmacy has suggested   Gatha Mayer, MD, [facility] 07/25/2019 10:07 AM   [phone] pager

## 2019-07-25 NOTE — Progress Notes (Signed)
Echocardiogram 2D Echocardiogram has been performed.  Oneal Deputy Lakeysha Slutsky 07/25/2019, 1:48 PM

## 2019-07-25 NOTE — Progress Notes (Signed)
PROGRESS NOTE    Roy [name]  RFF:[id number] DOB: 11-Aug-1945 DOA: 07/23/2019 PCP: Jinny Sanders, MD      Brief Narrative:  [name] is a 74 y.o. M with CAD s/p PCI in 2001, 2016, depression, HTN, and OSA who presented with abdominal pain and bloating, progressive over a month, now severe.  Patient first noted abdominal discomfort, crampy with bloating about a month ago, this has been waxing and waning, but in the last day he developed nausea and vomiting, inability to pass stool, and cold sweats.  Of note he has had unintentional weight loss of 15 pounds over the last couple months.  In the ER, creatinine 1.65 from baseline 1.2, WBC 7.1, and CT the abdomen and pelvis showed a 5 cm obstructing lesion in the sigmoid colon with small bowel obstruction.        Assessment & Plan:  Malignant small bowel obstruction  X-ray this morning still shows small bowel obstruction GI plan either enema and stent or bowel prep through NG.    -Continue NG -Continue IVF -Consult general surgery GI   Coronary disease, secondary prevention Hypertension  -Hold Plavix -Resume statin and metoprolol perioperatively as soon as NG tube removed -Hold ACE until after surgery -Continue aspirin suppository    AKI on CKD IIIa Baseline creatinine 1.2, admitted with creatinine 1.6 due to dehydration. Now resolved  Deprssion -Restart Effexor when able to take p.o.  Prediabetes A1c 6.2%.  Hyperglycemic initially but subsequently normal -Twice daily glucose checks.   Sleep apnea Has sleep apnea, doesn't use CPAP.         Disposition: Status is: Inpatient  Remains inpatient appropriate because:IV treatments appropriate due to intensity of illness or inability to take PO patient remains on IV fluids NG tube for colectomy.   Dispo: The patient is from: Home              Anticipated d/c is to: Home              Anticipated d/c date is: > 3 days              Patient currently  is not medically stable to d/c.              MDM: The below labs and imaging reports reviewed and summarized above.  Medication management as above.      DVT prophylaxis: enoxaparin (LOVENOX) injection 40 mg Start: 07/23/19 1800  Code Status: FULL Family Communication: Patient requested not    Consultants:   Gen Surg  GI  Procedures:     Antimicrobials:       Culture data:              Subjective: Patient with numerous loose stools overnight.  Still a lot of output from his NG tube.  No fever.  No confusion.  No respiratory distress or chest pain.  Abdomen feels distended still.      Objective: Vitals:   07/24/19 1538 07/24/19 1700 07/24/19 1956 07/25/19 0402  BP: 130/83  120/80 122/81  Pulse: 87  84 83  Resp: 17  18 17   Temp: 98.7 F (37.1 C)  98.3 F (36.8 C) 98.1 F (36.7 C)  TempSrc:    Oral  SpO2: 95%  95% 96%  Weight:  76.1 kg    Height:  5\' 6"  (1.676 m)      Intake/Output Summary (Last 24 hours) at 07/25/2019 0930 Last data filed at 07/24/2019 1859 Gross per 24 hour  Intake --  Output 1250 ml  Net -1250 ml   Filed Weights   07/24/19 1700  Weight: 76.1 kg    Examination: General appearance: Obese adult male, lying in bed, no obvious distress, appears tense, interactive     HEENT: NG tube in place Skin: No suspicious rashes or lesions, warm and dry Cardiac: RRR, no lower extremity edema Respiratory: Normal respiratory lungs clear without rales or wheezes Abdomen: Somewhat tender, diffusely, with voluntary guarding.  No ascites or distention. MSK: No deformities or effusions, normal muscle bulk and tone Neuro: Awake and alert, intact, moves all extremities with normal strength and coordination, speech fluent Psych: Sensorium intact and responding to questions, attention normal, affect normal, judgment and insight appear normal     Data Reviewed: I have personally reviewed following labs and imaging  studies:  CBC: Recent Labs  Lab 07/23/19 0957 07/24/19 0144 07/25/19 0246  WBC 17.1*  17.7* 9.8 6.8  NEUTROABS 13.5*  --  4.4  HGB 16.6  16.4 14.8 14.2  HCT 48.7  48.2 43.7 43.4  MCV 87.1  86.2 87.1 89.9  PLT 413*  385 290 706   Basic Metabolic Panel: Recent Labs  Lab 07/23/19 0957 07/24/19 0144 07/25/19 0246  NA 138 139 142  K 4.0 4.5 4.5  CL 95* 99 104  CO2 24 29 28   GLUCOSE 221* 151* 128*  BUN 28* 27* 20  CREATININE 1.65* 1.14 1.10  CALCIUM 10.0 9.2 9.1  MG  --   --  2.1  PHOS  --   --  2.7   GFR: Estimated Creatinine Clearance: 54 mL/min (by C-G formula based on SCr of 1.1 mg/dL). Liver Function Tests: Recent Labs  Lab 07/23/19 0957 07/25/19 0246  AST 30 23  ALT 42 32  ALKPHOS 103 72  BILITOT 1.2 1.1  PROT 7.5 6.0*  ALBUMIN 3.4* 2.8*   Recent Labs  Lab 07/23/19 0957  LIPASE 22   No results for input(s): AMMONIA in the last 168 hours. Coagulation Profile: Recent Labs  Lab 07/24/19 0937  INR 1.1   Cardiac Enzymes: No results for input(s): CKTOTAL, CKMB, CKMBINDEX, TROPONINI in the last 168 hours. BNP (last 3 results) No results for input(s): PROBNP in the last 8760 hours. HbA1C: Recent Labs    07/23/19 0957  HGBA1C 6.2*   CBG: Recent Labs  Lab 07/24/19 1654 07/24/19 1954 07/24/19 2356 07/25/19 0401 07/25/19 0740  GLUCAP 105* 109* 126* 132* 145*   Lipid Profile: Recent Labs    07/25/19 0246  TRIG 130   Thyroid Function Tests: No results for input(s): TSH, T4TOTAL, FREET4, T3FREE, THYROIDAB in the last 72 hours. Anemia Panel: No results for input(s): VITAMINB12, FOLATE, FERRITIN, TIBC, IRON, RETICCTPCT in the last 72 hours. Urine analysis:    Component Value Date/Time   COLORURINE LT YELLOW 12/08/2005 0741   LABSPEC 1.025 12/08/2005 0741   PHURINE 6.0 12/08/2005 0741   GLUCOSEU NEGATIVE 12/08/2005 0741   BILIRUBINUR NEGATIVE 12/08/2005 0741   KETONESUR NEGATIVE 12/08/2005 0741   UROBILINOGEN 0.2 mg/dL 12/08/2005  0741   NITRITE Negative 12/08/2005 0741   LEUKOCYTESUR Negative 12/08/2005 0741   Sepsis Labs: @LABRCNTIP (procalcitonin:4,lacticacidven:4)  ) Recent Results (from the past 240 hour(s))  SARS Coronavirus 2 by RT PCR (hospital order, performed in [facility] hospital lab) Nasopharyngeal Nasopharyngeal Swab     Status: None   Collection Time: 07/23/19  3:24 PM   Specimen: Nasopharyngeal Swab  Result Value Ref Range Status   SARS Coronavirus 2 NEGATIVE NEGATIVE  Final    Comment: (NOTE) SARS-CoV-2 target nucleic acids are NOT DETECTED.  The SARS-CoV-2 RNA is generally detectable in upper and lower respiratory specimens during the acute phase of infection. The lowest concentration of SARS-CoV-2 viral copies this assay can detect is 250 copies / mL. A negative result does not preclude SARS-CoV-2 infection and should not be used as the sole basis for treatment or other patient management decisions.  A negative result may occur with improper specimen collection / handling, submission of specimen other than nasopharyngeal swab, presence of viral mutation(s) within the areas targeted by this assay, and inadequate number of viral copies (<250 copies / mL). A negative result must be combined with clinical observations, patient history, and epidemiological information.  Fact Sheet for Patients:   StrictlyIdeas.no  Fact Sheet for Healthcare Providers: BankingDealers.co.za  This test is not yet approved or  cleared by the [place] FDA and has been authorized for detection and/or diagnosis of SARS-CoV-2 by FDA under an Emergency Use Authorization (EUA).  This EUA will remain in effect (meaning this test can be used) for the duration of the COVID-19 declaration under Section 564(b)(1) of the Act, 21 U.S.C. section 360bbb-3(b)(1), unless the authorization is terminated or revoked sooner.  Performed at [facility], [place] [address],  [place], [place] [address]          Radiology Studies: DG Abd 1 View  Result Date: 07/25/2019 CLINICAL DATA:  Bowel obstruction. EXAM: ABDOMEN - 1 VIEW COMPARISON:  The CT abdomen pelvis-07/23/2019 FINDINGS: There is moderate to marked gaseous distention of multiple loops of predominantly small bowel with index loop of small bowel in the left mid hemiabdomen measuring 3.5 cm in diameter. A small amount of air is seen within the ascending and transverse colon however otherwise series a conspicuous paucity of distal colonic gas. No supine evidence of pneumoperitoneum. No pneumatosis or portal venous gas Enteric tip and side port projected the expected location of in by the stomach Limited visualization of the lower thorax demonstrates mild elevation of the right hemidiaphragm. A punctate granuloma overlies the left lower lobe with associated partially calcified left hilar lymph nodes, the sequela of previous granulomatous infection. Excreted contrast is seen within the urinary bladder. Mild to moderate multilevel lumbar spine DDD is suspected though incompletely evaluated. IMPRESSION: 1. Findings most suggestive of small-bowel obstruction though note, a distal colonic lesion was identified on abdominal CT performed 07/23/2019. 2. Enteric tube tip and side port project over the expected location of the mid body of the stomach. Electronically Signed   By: Sandi Mariscal M.D.   On: 07/25/2019 07:08   CT CHEST W CONTRAST  Result Date: 07/24/2019 CLINICAL DATA:  Obstructing sigmoid colon mass. Evaluate for metastatic disease. EXAM: CT CHEST WITH CONTRAST TECHNIQUE: Multidetector CT imaging of the chest was performed during intravenous contrast administration. CONTRAST:  51mL OMNIPAQUE IOHEXOL 300 MG/ML  SOLN COMPARISON:  CT abdomen/pelvis 07/23/2019 FINDINGS: Cardiovascular: The heart is normal in size. No pericardial effusion. The aorta is normal in caliber. No dissection. Minimal atherosclerotic calcification at  the aortic arch. Branch vessels are patent. Advanced three-vessel coronary artery calcifications are noted. Mediastinum/Nodes: No mediastinal or hilar mass or lymphadenopathy. The esophagus is grossly normal. There is an NG tube coursing down the esophagus and into the stomach. Lungs/Pleura: No worrisome pulmonary lesions to suggest metastatic disease no acute pulmonary findings. No pleural effusions. Streaky right basilar atelectasis is noted. Upper Abdomen: Persistent dilated small bowel and colon. No free air.  The gallbladder is mildly distended. No hepatic lesions are identified. Musculoskeletal: No significant bony findings. IMPRESSION: 1. No CT findings for pulmonary metastatic disease. 2. No mediastinal or hilar mass or adenopathy. 3. Advanced three-vessel coronary artery calcifications. 4. Persistent dilated small bowel and colon. 5. Aortic atherosclerosis. Aortic Atherosclerosis (ICD10-I70.0). Electronically Signed   By: Marijo Sanes M.D.   On: 07/24/2019 13:06   CT ABDOMEN PELVIS W CONTRAST  Result Date: 07/23/2019 CLINICAL DATA:  Abdominal distension and back pain.  Bloody stools. EXAM: CT ABDOMEN AND PELVIS WITH CONTRAST TECHNIQUE: Multidetector CT imaging of the abdomen and pelvis was performed using the standard protocol following bolus administration of intravenous contrast. CONTRAST:  6mL OMNIPAQUE IOHEXOL 300 MG/ML  SOLN COMPARISON:  None. FINDINGS: Lower chest: Streaky right basilar scarring changes are noted. There is a calcified granuloma noted at the left lung base. No worrisome pulmonary lesions or pleural effusion. The heart is normal in size. No pericardial effusion. Coronary artery calcifications are noted. The distal esophagus is grossly normal. Hepatobiliary: Very small low-attenuation lesion at the hepatic dome is likely a benign cyst. No worrisome hepatic lesions are identified. The gallbladder is unremarkable. No common bile duct dilatation. Pancreas: No mass, inflammation or  ductal dilatation. Spleen: Normal size. A few small scattered calcified granulomas are noted. Adrenals/Urinary Tract: The adrenal glands are normal. No renal lesions or hydronephrosis.  Bladder is unremarkable. Stomach/Bowel: The stomach is mildly distended with fluid and air. The small bowel is dilated and demonstrates scattered air-fluid levels. The colon is also dilated with fluid and air down to an obstructing enhancing lesion in the upper sigmoid colon near the sigmoid colon descending colon junction region. This measures approximately 5 cm and is highly suspicious for colonic neoplasm causing low colonic obstruction. Recommend surgical consultation. Vascular/Lymphatic: Advanced atherosclerotic calcifications involving the aorta and iliac arteries. No aneurysm. The branch vessels are patent. The major venous structures are patent. There are small scattered subcentimeter retroperitoneal lymph nodes. No mass or overt adenopathy. No pelvic adenopathy. I do not see any enlarged lymph nodes in the sigmoid mesocolon or in the pelvis to suggest locoregional adenopathy. Reproductive: The prostate gland and seminal vesicles are unremarkable. Other: No free pelvic fluid collections. No inguinal mass or adenopathy. Musculoskeletal: No significant bony findings. IMPRESSION: 1. 5 cm obstructing enhancing lesion in the upper sigmoid colon near the sigmoid colon descending colon junction region worrisome for colonic neoplasm causing low colonic obstruction. Fairly dilated colon and small bowel above the lesion. Recommend surgical consultation. 2. No findings for locoregional adenopathy or distant metastatic disease. 3. Advanced atherosclerotic calcifications involving the aorta and iliac arteries. 4. Aortic atherosclerosis. Aortic Atherosclerosis (ICD10-I70.0). Electronically Signed   By: Marijo Sanes M.D.   On: 07/23/2019 13:47   DG Chest Port 1 View  Result Date: 07/23/2019 CLINICAL DATA:  Patient arrived by [facility]  complaining of lower back pain radiating through to abdomen x 1 month. States that he has had increased gas and dark stools for several weeks. Has just developed nausea and vomiting x 1 day. Alert EXAM: PORTABLE CHEST - 1 VIEW COMPARISON:  02/10/2012 FINDINGS: Linear scarring or subsegmental atelectasis in the right mid lung. Stable calcified granuloma, left lower lung. No evidence of edema. Heart size and mediastinal contours are within normal limits. No effusion.  No pneumothorax. Visualized bones unremarkable. IMPRESSION: New linear scarring or atelectasis, right mid lung. Otherwise negative. Electronically Signed   By: Lucrezia Europe M.D.   On: 07/23/2019 12:21   [place] EKG  SITE RITE  Result Date: 07/24/2019 If Site Rite image not attached, placement could not be confirmed due to current cardiac rhythm.       Scheduled Meds: . aspirin  150 mg Rectal Daily  . enoxaparin (LOVENOX) injection  40 mg Subcutaneous Q24H  . insulin aspart  0-6 Units Subcutaneous Q6H  . pantoprazole (PROTONIX) IV  40 mg Intravenous Q12H  . sodium chloride flush  3 mL Intravenous Once  . sodium chloride flush  3 mL Intravenous Q12H   Continuous Infusions: . dextrose 5% lactated ringers with KCl 20 mEq/L 75 mL/hr at 07/25/19 0600  . methocarbamol (ROBAXIN) IV       LOS: 2 days    Time spent: 25 minutes    Edwin Dada, MD [facility] 07/25/2019, 9:30 AM     Please page though [place] or Epic secure chat:  For [employer], Adult nurse

## 2019-07-26 ENCOUNTER — Inpatient Hospital Stay (HOSPITAL_COMMUNITY): Payer: Medicare HMO

## 2019-07-26 ENCOUNTER — Encounter (HOSPITAL_COMMUNITY): Payer: Self-pay | Admitting: Internal Medicine

## 2019-07-26 ENCOUNTER — Encounter (HOSPITAL_COMMUNITY)
Admission: AD | Disposition: A | Discharge status: Home / Self Care | Payer: Self-pay | Source: Home / Self Care | Attending: Family Medicine

## 2019-07-26 ENCOUNTER — Inpatient Hospital Stay (HOSPITAL_COMMUNITY): Payer: Medicare HMO | Admitting: Certified Registered Nurse Anesthetist

## 2019-07-26 DIAGNOSIS — G35 Multiple sclerosis: Secondary | ICD-10-CM | POA: Diagnosis not present

## 2019-07-26 DIAGNOSIS — R933 Abnormal findings on diagnostic imaging of other parts of digestive tract: Secondary | ICD-10-CM

## 2019-07-26 DIAGNOSIS — I129 Hypertensive chronic kidney disease with stage 1 through stage 4 chronic kidney disease, or unspecified chronic kidney disease: Secondary | ICD-10-CM | POA: Diagnosis not present

## 2019-07-26 DIAGNOSIS — N179 Acute kidney failure, unspecified: Secondary | ICD-10-CM | POA: Diagnosis not present

## 2019-07-26 DIAGNOSIS — E871 Hypo-osmolality and hyponatremia: Secondary | ICD-10-CM | POA: Diagnosis not present

## 2019-07-26 DIAGNOSIS — K56600 Partial intestinal obstruction, unspecified as to cause: Secondary | ICD-10-CM | POA: Diagnosis not present

## 2019-07-26 DIAGNOSIS — N1831 Chronic kidney disease, stage 3a: Secondary | ICD-10-CM | POA: Diagnosis not present

## 2019-07-26 DIAGNOSIS — Z20822 Contact with and (suspected) exposure to covid-19: Secondary | ICD-10-CM | POA: Diagnosis not present

## 2019-07-26 DIAGNOSIS — E43 Unspecified severe protein-calorie malnutrition: Secondary | ICD-10-CM | POA: Diagnosis not present

## 2019-07-26 DIAGNOSIS — I2510 Atherosclerotic heart disease of native coronary artery without angina pectoris: Secondary | ICD-10-CM | POA: Diagnosis not present

## 2019-07-26 DIAGNOSIS — E860 Dehydration: Secondary | ICD-10-CM | POA: Diagnosis not present

## 2019-07-26 HISTORY — PX: BIOPSY: SHX5522

## 2019-07-26 HISTORY — PX: COLONOSCOPY: SHX5424

## 2019-07-26 LAB — CBC WITH DIFFERENTIAL/PLATELET
Abs Immature Granulocytes: 0.05 10*3/uL (ref 0.00–0.07)
Basophils Absolute: 0 10*3/uL (ref 0.0–0.1)
Basophils Relative: 0 %
Eosinophils Absolute: 0.1 10*3/uL (ref 0.0–0.5)
Eosinophils Relative: 1 %
HCT: 40.5 % (ref 39.0–52.0)
Hemoglobin: 13.3 g/dL (ref 13.0–17.0)
Immature Granulocytes: 1 %
Lymphocytes Relative: 12 %
Lymphs Abs: 1.1 10*3/uL (ref 0.7–4.0)
MCH: 29.3 pg (ref 26.0–34.0)
MCHC: 32.8 g/dL (ref 30.0–36.0)
MCV: 89.2 fL (ref 80.0–100.0)
Monocytes Absolute: 1.2 10*3/uL — ABNORMAL HIGH (ref 0.1–1.0)
Monocytes Relative: 13 %
Neutro Abs: 7 10*3/uL (ref 1.7–7.7)
Neutrophils Relative %: 73 %
Platelets: 245 10*3/uL (ref 150–400)
RBC: 4.54 MIL/uL (ref 4.22–5.81)
RDW: 12.8 % (ref 11.5–15.5)
WBC: 9.4 10*3/uL (ref 4.0–10.5)
nRBC: 0 % (ref 0.0–0.2)

## 2019-07-26 LAB — COMPREHENSIVE METABOLIC PANEL
ALT: 43 U/L (ref 0–44)
AST: 24 U/L (ref 15–41)
Albumin: 2.7 g/dL — ABNORMAL LOW (ref 3.5–5.0)
Alkaline Phosphatase: 112 U/L (ref 38–126)
Anion gap: 10 (ref 5–15)
BUN: 11 mg/dL (ref 8–23)
CO2: 28 mmol/L (ref 22–32)
Calcium: 8.9 mg/dL (ref 8.9–10.3)
Chloride: 103 mmol/L (ref 98–111)
Creatinine, Ser: 1.05 mg/dL (ref 0.61–1.24)
GFR calc Af Amer: >60 mL/min (ref >60)
GFR calc non Af Amer: >60 mL/min (ref >60)
Glucose, Bld: 135 mg/dL — ABNORMAL HIGH (ref 70–99)
Potassium: 4 mmol/L (ref 3.5–5.1)
Sodium: 141 mmol/L (ref 135–145)
Total Bilirubin: 1.2 mg/dL (ref 0.3–1.2)
Total Protein: 6 g/dL — ABNORMAL LOW (ref 6.5–8.1)

## 2019-07-26 LAB — CEA: CEA: 3.8 ng/mL (ref 0.0–4.7)

## 2019-07-26 LAB — PHOSPHORUS: Phosphorus: 2.5 mg/dL (ref 2.5–4.6)

## 2019-07-26 LAB — PREALBUMIN: Prealbumin: 10.5 mg/dL — ABNORMAL LOW (ref 18–38)

## 2019-07-26 LAB — TRIGLYCERIDES: Triglycerides: 120 mg/dL (ref <150)

## 2019-07-26 LAB — GLUCOSE, CAPILLARY
Glucose-Capillary: 121 mg/dL — ABNORMAL HIGH (ref 70–99)
Glucose-Capillary: 117 mg/dL — ABNORMAL HIGH (ref 70–99)
Glucose-Capillary: 155 mg/dL — ABNORMAL HIGH (ref 70–99)

## 2019-07-26 LAB — BRAIN NATRIURETIC PEPTIDE: B Natriuretic Peptide: 95.9 pg/mL (ref 0.0–100.0)

## 2019-07-26 LAB — MAGNESIUM: Magnesium: 1.9 mg/dL (ref 1.7–2.4)

## 2019-07-26 SURGERY — SIGMOIDOSCOPY, FLEXIBLE
Anesthesia: Monitor Anesthesia Care

## 2019-07-26 SURGERY — COLONOSCOPY
Anesthesia: Monitor Anesthesia Care

## 2019-07-26 MED ORDER — PROPOFOL 500 MG/50ML IV EMUL
INTRAVENOUS | Status: DC | PRN
  Administered 2019-07-26: 75 ug/kg/min via INTRAVENOUS

## 2019-07-26 MED ORDER — SODIUM CHLORIDE 0.9 % IV SOLN: INTRAVENOUS | Status: DC

## 2019-07-26 MED ORDER — LACTATED RINGERS IV SOLN: INTRAVENOUS | Status: DC | PRN

## 2019-07-26 MED ORDER — PROPOFOL 10 MG/ML IV BOLUS
INTRAVENOUS | Status: DC | PRN
  Administered 2019-07-26: 20 mg via INTRAVENOUS

## 2019-07-26 MED ORDER — IOHEXOL 300 MG/ML  SOLN
100.0000 mL | Freq: Once | INTRAMUSCULAR | Status: AC | PRN
  Administered 2019-07-26: 100 mL via INTRAVENOUS

## 2019-07-26 NOTE — Transfer of Care (Signed)
Immediate Anesthesia Transfer of Care Note  Patient: Roy [name]  Procedure(s) Performed: COLONOSCOPY (N/A ) BIOPSY  Patient Location: Endoscopy Unit  Anesthesia Type:MAC  Level of Consciousness: drowsy and patient cooperative  Airway & Oxygen Therapy: Patient Spontanous Breathing  Post-op Assessment: Report given to RN, Post -op Vital signs reviewed and stable and Patient moving all extremities X 4  Post vital signs: Reviewed and stable  Last Vitals:  Vitals Value Taken Time  BP 114/73 07/26/19 1258  Temp    Pulse 88 07/26/19 1258  Resp 25 07/26/19 1258  SpO2 99 % 07/26/19 1258  Vitals shown include unvalidated device data.  Last Pain:  Vitals:   07/26/19 1137  TempSrc: Oral  PainSc: 0-No pain         Complications: No complications documented.

## 2019-07-26 NOTE — Progress Notes (Signed)
Subjective:  Denies SSCP, palpitations or Dyspnea For colonoscopy today ? Stenting   Objective:  Vitals:   07/25/19 1344 07/25/19 1500 07/25/19 2026 07/26/19 0618  BP:  139/85 133/79 120/77  Pulse:  80 86 83  Resp:  15 18 16   Temp:  98.3 F (36.8 C) 99 F (37.2 C) 98.1 F (36.7 C)  TempSrc:  Oral Oral Oral  SpO2:  98% 97% 94%  Weight: 73.6 kg     Height:        Intake/Output from previous day:  Intake/Output Summary (Last 24 hours) at 07/26/2019 0833 Last data filed at 07/26/2019 0800 Gross per 24 hour  Intake 3907.1 ml  Output 0 ml  Net 3907.1 ml    Physical Exam: Affect appropriate Healthy:  appears stated age HEENT: NG tube in place  Neck supple with no adenopathy JVP normal no bruits no thyromegaly Lungs clear with no wheezing and good diaphragmatic motion Heart:  S1/S2 no murmur, no rub, gallop or click PMI normal Abdomen: mildly typanitic  Distal pulses intact with no bruits Mild edema with stasis changes  Neuro non-focal Skin warm and dry No muscular weakness   Lab Results: Basic Metabolic Panel: Recent Labs    07/25/19 0246 07/26/19 0226  NA 142 141  K 4.5 4.0  CL 104 103  CO2 28 28  GLUCOSE 128* 135*  BUN 20 11  CREATININE 1.10 1.05  CALCIUM 9.1 8.9  MG 2.1 1.9  PHOS 2.7 2.5   Liver Function Tests: Recent Labs    07/25/19 0246 07/26/19 0226  AST 23 24  ALT 32 43  ALKPHOS 72 112  BILITOT 1.1 1.2  PROT 6.0* 6.0*  ALBUMIN 2.8* 2.7*   Recent Labs    07/23/19 0957  LIPASE 22   CBC: Recent Labs    07/25/19 0246 07/26/19 0226  WBC 6.8 9.4  NEUTROABS 4.4 7.0  HGB 14.2 13.3  HCT 43.4 40.5  MCV 89.9 89.2  PLT 240 245   Cardiac Enzymes: No results for input(s): CKTOTAL, CKMB, CKMBINDEX, TROPONINI in the last 72 hours. BNP: Invalid input(s): POCBNP D-Dimer: No results for input(s): DDIMER in the last 72 hours. Hemoglobin A1C: Recent Labs    07/23/19 0957  HGBA1C 6.2*   Fasting Lipid Panel: Recent Labs     07/26/19 0226  TRIG 120    Imaging: DG Abd 1 View  Result Date: 07/25/2019 CLINICAL DATA:  Bowel obstruction. EXAM: ABDOMEN - 1 VIEW COMPARISON:  The CT abdomen pelvis-07/23/2019 FINDINGS: There is moderate to marked gaseous distention of multiple loops of predominantly small bowel with index loop of small bowel in the left mid hemiabdomen measuring 3.5 cm in diameter. A small amount of air is seen within the ascending and transverse colon however otherwise series a conspicuous paucity of distal colonic gas. No supine evidence of pneumoperitoneum. No pneumatosis or portal venous gas Enteric tip and side port projected the expected location of in by the stomach Limited visualization of the lower thorax demonstrates mild elevation of the right hemidiaphragm. A punctate granuloma overlies the left lower lobe with associated partially calcified left hilar lymph nodes, the sequela of previous granulomatous infection. Excreted contrast is seen within the urinary bladder. Mild to moderate multilevel lumbar spine DDD is suspected though incompletely evaluated. IMPRESSION: 1. Findings most suggestive of small-bowel obstruction though note, a distal colonic lesion was identified on abdominal CT performed 07/23/2019. 2. Enteric tube tip and side port project over the expected location of the mid  body of the stomach. Electronically Signed   By: Sandi Mariscal M.D.   On: 07/25/2019 07:08   CT CHEST W CONTRAST  Result Date: 07/24/2019 CLINICAL DATA:  Obstructing sigmoid colon mass. Evaluate for metastatic disease. EXAM: CT CHEST WITH CONTRAST TECHNIQUE: Multidetector CT imaging of the chest was performed during intravenous contrast administration. CONTRAST:  25mL OMNIPAQUE IOHEXOL 300 MG/ML  SOLN COMPARISON:  CT abdomen/pelvis 07/23/2019 FINDINGS: Cardiovascular: The heart is normal in size. No pericardial effusion. The aorta is normal in caliber. No dissection. Minimal atherosclerotic calcification at the aortic arch.  Branch vessels are patent. Advanced three-vessel coronary artery calcifications are noted. Mediastinum/Nodes: No mediastinal or hilar mass or lymphadenopathy. The esophagus is grossly normal. There is an NG tube coursing down the esophagus and into the stomach. Lungs/Pleura: No worrisome pulmonary lesions to suggest metastatic disease no acute pulmonary findings. No pleural effusions. Streaky right basilar atelectasis is noted. Upper Abdomen: Persistent dilated small bowel and colon. No free air. The gallbladder is mildly distended. No hepatic lesions are identified. Musculoskeletal: No significant bony findings. IMPRESSION: 1. No CT findings for pulmonary metastatic disease. 2. No mediastinal or hilar mass or adenopathy. 3. Advanced three-vessel coronary artery calcifications. 4. Persistent dilated small bowel and colon. 5. Aortic atherosclerosis. Aortic Atherosclerosis (ICD10-I70.0). Electronically Signed   By: Marijo Sanes M.D.   On: 07/24/2019 13:06   DG Abd Portable 1V  Result Date: 07/26/2019 CLINICAL DATA:  Bowel obstruction EXAM: PORTABLE ABDOMEN - 1 VIEW COMPARISON:  July 25, 2019 abdominal radiograph and CT abdomen and pelvis July 23, 2019 FINDINGS: Nasogastric tube tip and side port in stomach. Multiple loops of dilated bowel remain with bowel dilatation of up to 4.1 cm. No significant colonic dilatation seen currently. No appreciable free air. No abnormal calcifications. IMPRESSION: Nasogastric tube tip and side port in stomach. There is persistent predominantly small bowel dilatation, felt to represent a degree of bowel obstruction. No free air appreciable on supine examination. Electronically Signed   By: Lowella Grip III M.D.   On: 07/26/2019 08:07   ECHOCARDIOGRAM COMPLETE  Result Date: 07/25/2019    ECHOCARDIOGRAM REPORT   Patient Name:   Roy [name] Date of Exam: 07/25/2019 Medical Rec #:  [id number]          Height:       66.0 in Accession #:    [id number]         Weight:        167.8 lb Date of Birth:  1945-03-21          BSA:          1.856 m Patient Age:    74 years           BP:           122/81 mmHg Patient Gender: M                  HR:           78 bpm. Exam Location:  Inpatient Procedure: 2D Echo, Color Doppler and Cardiac Doppler Indications:    Pre-Op Evaluation Z01.810  History:        Patient has prior history of Echocardiogram examinations, most                 recent 04/19/2015. Risk Factors:Hypertension, Diabetes,                 Dyslipidemia and Sleep Apnea.  Sonographer:    Raquel Sarna Senior RDCS Referring Phys:  [id number] CHRISTOPHER P DANFORD IMPRESSIONS  1. Left ventricular ejection fraction, by estimation, is 70 to 75%. The left ventricle has hyperdynamic function. The left ventricle has no regional wall motion abnormalities. Left ventricular diastolic parameters are consistent with Grade I diastolic dysfunction (impaired relaxation).  2. Right ventricular systolic function is normal. The right ventricular size is normal. There is normal pulmonary artery systolic pressure.  3. The mitral valve is normal in structure. Trivial mitral valve regurgitation. No evidence of mitral stenosis.  4. The aortic valve is tricuspid. Aortic valve regurgitation is not visualized. No aortic stenosis is present.  5. The inferior vena cava is normal in size with greater than 50% respiratory variability, suggesting right atrial pressure of 3 mmHg. FINDINGS  Left Ventricle: Left ventricular ejection fraction, by estimation, is 70 to 75%. The left ventricle has hyperdynamic function. The left ventricle has no regional wall motion abnormalities. The left ventricular internal cavity size was normal in size. There is no left ventricular hypertrophy. Left ventricular diastolic parameters are consistent with Grade I diastolic dysfunction (impaired relaxation). Right Ventricle: The right ventricular size is normal.Right ventricular systolic function is normal. There is normal pulmonary artery systolic  pressure. The tricuspid regurgitant velocity is 2.35 m/s, and with an assumed right atrial pressure of 3 mmHg, the estimated right ventricular systolic pressure is 74.2 mmHg. Left Atrium: Left atrial size was normal in size. Right Atrium: Right atrial size was normal in size. Pericardium: There is no evidence of pericardial effusion. Mitral Valve: The mitral valve is normal in structure. Normal mobility of the mitral valve leaflets. Trivial mitral valve regurgitation. No evidence of mitral valve stenosis. Tricuspid Valve: The tricuspid valve is normal in structure. Tricuspid valve regurgitation is trivial. No evidence of tricuspid stenosis. Aortic Valve: The aortic valve is tricuspid. Aortic valve regurgitation is not visualized. No aortic stenosis is present. Pulmonic Valve: The pulmonic valve was not well visualized. Pulmonic valve regurgitation is not visualized. No evidence of pulmonic stenosis. Aorta: The aortic root is normal in size and structure. Venous: The inferior vena cava is normal in size with greater than 50% respiratory variability, suggesting right atrial pressure of 3 mmHg. IAS/Shunts: No atrial level shunt detected by color flow Doppler.  LEFT VENTRICLE PLAX 2D LVIDd:         3.14 cm  Diastology LVIDs:         1.99 cm  LV e' lateral:   8.81 cm/s LV PW:         1.10 cm  LV E/e' lateral: 6.6 LV IVS:        1.15 cm  LV e' medial:    6.64 cm/s LVOT diam:     2.10 cm  LV E/e' medial:  8.8 LV SV:         60 LV SV Index:   32 LVOT Area:     3.46 cm  RIGHT VENTRICLE RV S prime:     14.90 cm/s TAPSE (M-mode): 2.2 cm LEFT ATRIUM             Index       RIGHT ATRIUM           Index LA diam:        3.70 cm 1.99 cm/m  RA Area:     16.10 cm LA Vol (A2C):   43.8 ml 23.60 ml/m RA Volume:   36.50 ml  19.67 ml/m LA Vol (A4C):   50.2 ml 27.05 ml/m LA Biplane Vol: 48.3 ml 26.02 ml/m  AORTIC VALVE LVOT Vmax:   86.50 cm/s LVOT Vmean:  56.500 cm/s LVOT VTI:    0.174 m  AORTA Ao Root diam: 3.50 cm MITRAL VALVE                TRICUSPID VALVE MV Area (PHT): 3.27 cm    TR Peak grad:   22.1 mmHg MV Decel Time: 232 msec    TR Vmax:        235.00 cm/s MV E velocity: 58.20 cm/s MV A velocity: 85.40 cm/s  SHUNTS MV E/A ratio:  0.68        Systemic VTI:  0.17 m                            Systemic Diam: 2.10 cm Kirk Ruths MD Electronically signed by Kirk Ruths MD Signature Date/Time: 07/25/2019/3:40:46 PM    Final    [place] EKG SITE RITE  Result Date: 07/25/2019 If Site Rite image not attached, placement could not be confirmed due to current cardiac rhythm.  [place] EKG SITE RITE  Result Date: 07/24/2019 If Site Rite image not attached, placement could not be confirmed due to current cardiac rhythm.   Cardiac Studies:  ECG:  Orders placed or performed during the hospital encounter of 07/23/19  . EKG 12-Lead  . EKG 12-Lead  . EKG 12-Lead  . EKG 12-Lead     Telemetry:  NSR   Echo: EF 70-75%   Medications:   . aspirin  150 mg Rectal Daily  . enoxaparin (LOVENOX) injection  40 mg Subcutaneous Q24H  . insulin aspart  0-6 Units Subcutaneous Q6H  . pantoprazole (PROTONIX) IV  40 mg Intravenous Q12H  . sodium chloride flush  3 mL Intravenous Once  . sodium chloride flush  3 mL Intravenous Q12H     . sodium chloride    . dextrose 5% lactated ringers with KCl 20 mEq/L 75 mL/hr (07/25/19 2052)  . methocarbamol (ROBAXIN) IV      Assessment/Plan:  Roy [name] is a 74 y.o. 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 And distal colonic lesion at the request of Dr. Loleta Books .  1. 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  Plavix held continue beta blocker moderate risk for surgery may  proceed  2. GI:  For colonoscopy today Discussed with Dr Carlean Purl trying to clear bowel and may need stent Ultimately will need Surgery with general anesthesia. Continue with NG tube   3. HTN;  Well controlled.  Continue current medications and low sodium Dash type diet.    4. HLD:  On low dose statin    Jenkins Rouge 07/26/2019, 8:33 AM

## 2019-07-26 NOTE — Anesthesia Procedure Notes (Signed)
Procedure Name: MAC Date/Time: 07/26/2019 12:31 PM Performed by: Darletta Moll, CRNA Pre-anesthesia Checklist: Patient identified, Emergency Drugs available, Suction available and Patient being monitored Patient Re-evaluated:Patient Re-evaluated prior to induction Oxygen Delivery Method: Nasal cannula

## 2019-07-26 NOTE — Op Note (Addendum)
[facility] Patient Name: Roy [name] Procedure Date : 07/26/2019 MRN: [id number] Attending MD: Gatha Mayer , MD Date of Birth: December 04, 1945 CSN: [id number] Age: 74 Admit Type: Inpatient Procedure:                Colonoscopy Indications:              Abnormal CT of the GI tract Providers:                Gatha Mayer, MD, Glori Bickers, RN, Laverda Sorenson, Technician, Tyrone Apple, Technician,                            Larene Beach, CRNA Referring MD:              Medicines:                Propofol per Anesthesia, Monitored Anesthesia Care Complications:            No immediate complications. Estimated Blood Loss:     Estimated blood loss was minimal. Procedure:                Pre-Anesthesia Assessment:                           - Prior to the procedure, a History and Physical                            was performed, and patient medications and                            allergies were reviewed. The patient's tolerance of                            previous anesthesia was also reviewed. The risks                            and benefits of the procedure and the sedation                            options and risks were discussed with the patient.                            All questions were answered, and informed consent                            was obtained. Prior Anticoagulants: The patient                            last took Lovenox (enoxaparin) 1 day prior to the                            procedure. ASA Grade Assessment: III - A patient  with severe systemic disease. After reviewing the                            risks and benefits, the patient was deemed in                            satisfactory condition to undergo the procedure.                           After obtaining informed consent, the colonoscope                            was passed under direct vision. Throughout the                             procedure, the patient's blood pressure, pulse, and                            oxygen saturations were monitored continuously. The                            [id number] ([id number] Olympus therapeutic                            gastroscope was introduced through the anus and                            advanced to the the cecum, identified by                            appendiceal orifice and ileocecal valve. After                            obtaining informed consent, the colonoscope was                            passed under direct vision. Throughout the                            procedure, the patient's blood pressure, pulse, and                            oxygen saturations were monitored continuously. The                            colonoscopy was performed without difficulty. The                            patient tolerated the procedure well. The quality                            of the bowel preparation was excellent. The  ileocecal valve, appendiceal orifice, and rectum                            were photographed. Scope In: 12:38:36 PM Scope Out: 12:50:55 PM Scope Withdrawal Time: 0 hours 5 minutes 26 seconds  Total Procedure Duration: 0 hours 12 minutes 19 seconds  Findings:      The perianal and digital rectal examinations were normal.      An area of congested mucosa was found in the proximal sigmoid colon.       Biopsies were taken with a cold forceps for histology. Verification of       patient identification for the specimen was done. Estimated blood loss       was minimal.      Multiple diverticula were found in the sigmoid colon and descending       colon.      The exam was otherwise without abnormality on direct and retroflexion       views. Impression:               - Congested mucosa in the proximal sigmoid colon.                            Biopsied. NO STRICTURE, NO COLON CANCER SEEN AS                             SUSPECTED BY CT SCAN - ? IF HE HAD A HIGH IMPACTION?                           - Diverticulosis in the sigmoid colon and in the                            descending colon.                           - The examination was otherwise normal on direct                            and retroflexion views.                           Note was planned as a flex sig and convereted to                            colonoscopy based upon lack of mass/stricture and                            clinical scenario Recommendation:           - Return patient to hospital ward for ongoing care.                           - Clear liquid diet.                           - REPEAT A CT SCAN ABD AND PELVIS                           -  Await pathology results. Procedure Code(s):        --- Professional ---                           [phone], Colonoscopy, flexible; with biopsy, single                            or multiple Diagnosis Code(s):        --- Professional ---                           K63.89, Other specified diseases of intestine                           K57.30, Diverticulosis of large intestine without                            perforation or abscess without bleeding                           R93.3, Abnormal findings on diagnostic imaging of                            other parts of digestive tract CPT copyright 2019 [employer]. All rights reserved. The codes documented in this report are preliminary and upon coder review may  be revised to meet current compliance requirements. Gatha Mayer, MD 07/26/2019 1:12:51 PM This report has been signed electronically. Number of Addenda: 0

## 2019-07-26 NOTE — Progress Notes (Signed)
Patient completed contrast medium via NG tube at 1730.

## 2019-07-26 NOTE — Progress Notes (Signed)
PROGRESS NOTE    Roy [name]  VHQ:[id number] DOB: [birth year] DOA: 07/23/2019 PCP: Jinny Sanders, MD      Brief Narrative:  Roy [name] is a 74 y.o. M with CAD s/p PCI in 2001, 2016, depression, HTN, and OSA who presented with abdominal pain and bloating, progressive over a month, now severe.  Patient first noted abdominal discomfort, crampy with bloating about a month ago, this has been waxing and waning, but in the last day he developed nausea and vomiting, inability to pass stool, and cold sweats.  Of note he has had unintentional weight loss of 15 pounds over the last couple months.  In the ER, creatinine 1.65 from baseline 1.2, WBC 7.1, and CT the abdomen and pelvis showed a 5 cm obstructing lesion in the sigmoid colon with small bowel obstruction.        Assessment & Plan:  Small bowel obstruction Admitted with SBO, CT showing mass in the colon.    7/12 Monday started passing stool 7/13 today passing stool but x-ray still showing dilated small bowel  GI plan colonoscopy and possible stent and biopsy today of suspected malignant mass  -Continue NG -Continue IVF  -Consult general surgery,  GI   ADDENDUM:  No mass was observed on colonoscopy.  Biopsy was taken of congested mucosa, but no mass or stricture was seen.    Follow up CT after colonoscopy showed interval decompression of the small and large bowel, and also new development of circumferential large bowel wall thickening proximal to the location of the previously radiographically seen obstruction, consistent with inflammatory or infectious colitis.      Coronary disease, secondary prevention Hypertension  -Hold Plavix for possible surgery -Resume metoprolol and statin when NG tube removed -Resume ACE when NG removed -Continue aspirin for now, this can be stopped if Plavix restarted   AKI on CKD IIIa Baseline creatinine 1.2, admitted with creatinine 1.6 due to dehydration. Now  resolved  Depression -Restart Effexor when able to take p.o.  Prediabetes A1c 6.2%.  Hyperglycemic initially but subsequently normal -Twice daily glucose checks.   Sleep apnea Has sleep apnea, doesn't use CPAP.         Disposition: Status is: Inpatient  Remains inpatient appropriate because:IV treatments appropriate due to intensity of illness or inability to take PO patient remains on IV fluids NG tube for SBO.   Dispo: The patient is from: Home              Anticipated d/c is to: Home              Anticipated d/c date is: > 3 days              Patient currently is not medically stable to d/c.     Initially thought to have a malignant obstruction requiring colectomy.    Now appears to have simply had a large bowel obstruction, which is resolving.  Likely will remove NG tube in next 24 hours and advance diet.  If able to tolerate diet, plan for home in the next 2-3 days with GI follow up.           MDM: The below labs and imaging reports reviewed and summarized above.  Medication management as above.      DVT prophylaxis: enoxaparin (LOVENOX) injection 40 mg Start: 07/23/19 1800  Code Status: FULL Family Communication: Patient requested not    Consultants:   Gen Surg  GI  Procedures:     Antimicrobials:  Culture data:              Subjective: Patient still having bowel movements.  NG tube output resolving.  No fever.  No confusion.  No respiratory distress or chest pain.      Objective: Vitals:   07/26/19 1310 07/26/19 1320 07/26/19 1343 07/26/19 2127  BP: 129/78 127/76 124/86 139/89  Pulse: 80 79 73 89  Resp: 19 16 16 18   Temp:   (!) 97.5 F (36.4 C) 98.9 F (37.2 C)  TempSrc:   Oral Oral  SpO2: 97% 96% 97% 98%  Weight:      Height:        Intake/Output Summary (Last 24 hours) at 07/26/2019 2216 Last data filed at 07/26/2019 1820 Gross per 24 hour  Intake 2387.7 ml  Output --  Net 2387.7 ml   Filed  Weights   07/24/19 1700 07/25/19 1344  Weight: 76.1 kg 73.6 kg    Examination: General appearance: Obese adult male, lying in bed, no obvious distress, interactive     HEENT: NG tube in place Skin: No suspicious rashes or lesions, warm and dry Cardiac: RRR, no lower extremity edema Respiratory: Normal respiratory rate and rhythm, lungs clear without rales or wheezes Abdomen: Abdomen soft without tenderness palpation, no ascites or distention, no rigidity. MSK: No deformities or effusions, normal muscle bulk and tone Neuro: Awake and alert, intact, moves all extremities with normal strength and coordination, speech fluent Psych: Sensorium intact and responding to questions, attention normal, affect tense, judgment and insight are normal     Data Reviewed: I have personally reviewed following labs and imaging studies:  CBC: Recent Labs  Lab 07/23/19 0957 07/24/19 0144 07/25/19 0246 07/26/19 0226  WBC 17.1*   17.7* 9.8 6.8 9.4  NEUTROABS 13.5*  --  4.4 7.0  HGB 16.6   16.4 14.8 14.2 13.3  HCT 48.7   48.2 43.7 43.4 40.5  MCV 87.1   86.2 87.1 89.9 89.2  PLT 413*   385 290 240 350   Basic Metabolic Panel: Recent Labs  Lab 07/23/19 0957 07/24/19 0144 07/25/19 0246 07/26/19 0226  NA 138 139 142 141  K 4.0 4.5 4.5 4.0  CL 95* 99 104 103  CO2 24 29 28 28   GLUCOSE 221* 151* 128* 135*  BUN 28* 27* 20 11  CREATININE 1.65* 1.14 1.10 1.05  CALCIUM 10.0 9.2 9.1 8.9  MG  --   --  2.1 1.9  PHOS  --   --  2.7 2.5   GFR: Estimated Creatinine Clearance: 56.5 mL/min (by C-G formula based on SCr of 1.05 mg/dL). Liver Function Tests: Recent Labs  Lab 07/23/19 0957 07/25/19 0246 07/26/19 0226  AST 30 23 24   ALT 42 32 43  ALKPHOS 103 72 112  BILITOT 1.2 1.1 1.2  PROT 7.5 6.0* 6.0*  ALBUMIN 3.4* 2.8* 2.7*   Recent Labs  Lab 07/23/19 0957  LIPASE 22   No results for input(s): AMMONIA in the last 168 hours. Coagulation Profile: Recent Labs  Lab 07/24/19 0937  INR 1.1    Cardiac Enzymes: No results for input(s): CKTOTAL, CKMB, CKMBINDEX, TROPONINI in the last 168 hours. BNP (last 3 results) No results for input(s): PROBNP in the last 8760 hours. HbA1C: No results for input(s): HGBA1C in the last 72 hours. CBG: Recent Labs  Lab 07/25/19 2030 07/25/19 2328 07/26/19 0631 07/26/19 0755 07/26/19 1617  GLUCAP 127* 107* 121* 117* 155*   Lipid Profile: Recent Labs  07/25/19 0246 07/26/19 0226  TRIG 130 120   Thyroid Function Tests: No results for input(s): TSH, T4TOTAL, FREET4, T3FREE, THYROIDAB in the last 72 hours. Anemia Panel: No results for input(s): VITAMINB12, FOLATE, FERRITIN, TIBC, IRON, RETICCTPCT in the last 72 hours. Urine analysis:    Component Value Date/Time   COLORURINE LT YELLOW 12/08/2005 0741   LABSPEC 1.025 12/08/2005 0741   PHURINE 6.0 12/08/2005 0741   GLUCOSEU NEGATIVE 12/08/2005 0741   BILIRUBINUR NEGATIVE 12/08/2005 0741   KETONESUR NEGATIVE 12/08/2005 0741   UROBILINOGEN 0.2 mg/dL 12/08/2005 0741   NITRITE Negative 12/08/2005 0741   LEUKOCYTESUR Negative 12/08/2005 0741   Sepsis Labs: @LABRCNTIP (procalcitonin:4,lacticacidven:4)  ) Recent Results (from the past 240 hour(s))  SARS Coronavirus 2 by RT PCR (hospital order, performed in [facility] hospital lab) Nasopharyngeal Nasopharyngeal Swab     Status: None   Collection Time: 07/23/19  3:24 PM   Specimen: Nasopharyngeal Swab  Result Value Ref Range Status   SARS Coronavirus 2 NEGATIVE NEGATIVE Final    Comment: (NOTE) SARS-CoV-2 target nucleic acids are NOT DETECTED.  The SARS-CoV-2 RNA is generally detectable in upper and lower respiratory specimens during the acute phase of infection. The lowest concentration of SARS-CoV-2 viral copies this assay can detect is 250 copies / mL. A negative result does not preclude SARS-CoV-2 infection and should not be used as the sole basis for treatment or other patient management decisions.  A negative result may  occur with improper specimen collection / handling, submission of specimen other than nasopharyngeal swab, presence of viral mutation(s) within the areas targeted by this assay, and inadequate number of viral copies (<250 copies / mL). A negative result must be combined with clinical observations, patient history, and epidemiological information.  Fact Sheet for Patients:   StrictlyIdeas.no  Fact Sheet for Healthcare Providers: BankingDealers.co.za  This test is not yet approved or  cleared by the [place] FDA and has been authorized for detection and/or diagnosis of SARS-CoV-2 by FDA under an Emergency Use Authorization (EUA).  This EUA will remain in effect (meaning this test can be used) for the duration of the COVID-19 declaration under Section 564(b)(1) of the Act, 21 U.S.C. section 360bbb-3(b)(1), unless the authorization is terminated or revoked sooner.  Performed at [facility], [place] [address], [place], [place] [address]          Radiology Studies: DG Abd 1 View  Result Date: 07/25/2019 CLINICAL DATA:  Bowel obstruction. EXAM: ABDOMEN - 1 VIEW COMPARISON:  The CT abdomen pelvis-07/23/2019 FINDINGS: There is moderate to marked gaseous distention of multiple loops of predominantly small bowel with index loop of small bowel in the left mid hemiabdomen measuring 3.5 cm in diameter. A small amount of air is seen within the ascending and transverse colon however otherwise series a conspicuous paucity of distal colonic gas. No supine evidence of pneumoperitoneum. No pneumatosis or portal venous gas Enteric tip and side port projected the expected location of in by the stomach Limited visualization of the lower thorax demonstrates mild elevation of the right hemidiaphragm. A punctate granuloma overlies the left lower lobe with associated partially calcified left hilar lymph nodes, the sequela of previous granulomatous  infection. Excreted contrast is seen within the urinary bladder. Mild to moderate multilevel lumbar spine DDD is suspected though incompletely evaluated. IMPRESSION: 1. Findings most suggestive of small-bowel obstruction though note, a distal colonic lesion was identified on abdominal CT performed 07/23/2019. 2. Enteric tube tip and side port project over the  expected location of the mid body of the stomach. Electronically Signed   By: Sandi Mariscal M.D.   On: 07/25/2019 07:08   CT ABDOMEN PELVIS W CONTRAST  Result Date: 07/26/2019 CLINICAL DATA:  Sigmoid mass, bowel obstruction EXAM: CT ABDOMEN AND PELVIS WITH CONTRAST TECHNIQUE: Multidetector CT imaging of the abdomen and pelvis was performed using the standard protocol following bolus administration of intravenous contrast. CONTRAST:  119mL OMNIPAQUE IOHEXOL 300 MG/ML  SOLN COMPARISON:  07/23/2019 FINDINGS: Lower chest: Right basilar atelectasis. 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 Nasogastric tube is seen extending into the mid to distal body of the stomach. Right coronary artery calcification and probable coronary artery stenting has been performed. Global cardiac size within normal limits. Hepatobiliary: Tiny hypodensity within the right hepatic dome, axial image # 8/3, is again seen and likely represents a tiny a Paddock cyst, though is too small to definitively characterize. The liver is otherwise unremarkable. The gallbladder is unremarkable. Pancreas: Unremarkable Spleen: Unremarkable Adrenals/Urinary Tract: The adrenal glands are unremarkable. The kidneys are unremarkable. The bladder is largely decompressed and is unremarkable. Stomach/Bowel: The previously noted sigmoid mass is not well appreciated on this examination. There is, however, interval development of extensive circumferential bowel wall thickening and mucosal fold thickening involving the entire colon proximal to the point of  obstruction within the sigmoid colon in keeping with an infectious or inflammatory colitis. There is normal enhancement of the colon. No pneumatosis or free intraperitoneal gas. The colon is largely decompressed. Similarly, there has been interval improvement in the caliber of the small bowel when compared to prior examination in keeping with improvement in large bowel obstruction, likely related to nasogastric decompression. No free intraperitoneal fluid. Appendix normal. Vascular/Lymphatic: There is no pathologic adenopathy within the abdomen and pelvis. The abdominal vasculature is age-appropriate with moderate aortoiliac atherosclerotic calcification. No aneurysm. Reproductive: Unremarkable Other: The rectum is unremarkable. Musculoskeletal: No lytic or blastic bone lesions are seen. Osseous structures are age-appropriate. IMPRESSION: Interval improvement in caliber of small bowel and decompression of the colon in keeping with near resolution of previously noted large bowel obstruction, likely related to nasogastric decompression. Interval development of a infectious or inflammatory colitis involving the entire colon proximal to the point of sigmoid obstruction. The obstructing mass is not well delineated on this examination. Tiny indeterminate lesion within the right hepatic dome likely represents a tiny cyst, the can be reassessed on subsequent examination. Aortic Atherosclerosis (ICD10-I70.0). Electronically Signed   By: Fidela Salisbury MD   On: 07/26/2019 21:35   DG Abd Portable 1V  Result Date: 07/26/2019 CLINICAL DATA:  Bowel obstruction EXAM: PORTABLE ABDOMEN - 1 VIEW COMPARISON:  July 25, 2019 abdominal radiograph and CT abdomen and pelvis July 23, 2019 FINDINGS: Nasogastric tube tip and side port in stomach. Multiple loops of dilated bowel remain with bowel dilatation of up to 4.1 cm. No significant colonic dilatation seen currently. No appreciable free air. No abnormal calcifications. IMPRESSION:  Nasogastric tube tip and side port in stomach. There is persistent predominantly small bowel dilatation, felt to represent a degree of bowel obstruction. No free air appreciable on supine examination. Electronically Signed   By: Lowella Grip III M.D.   On: 07/26/2019 08:07   ECHOCARDIOGRAM COMPLETE  Result Date: 07/25/2019    ECHOCARDIOGRAM REPORT   Patient Name:   Roy [name] Date of Exam: 07/25/2019 Medical Rec #:  [id number]          Height:  66.0 in Accession #:    [id number]         Weight:       167.8 lb Date of Birth:  1945/12/15          BSA:          1.856 m Patient Age:    75 years           BP:           122/81 mmHg Patient Gender: M                  HR:           78 bpm. Exam Location:  Inpatient Procedure: 2D Echo, Color Doppler and Cardiac Doppler Indications:    Pre-Op Evaluation Z01.810  History:        Patient has prior history of Echocardiogram examinations, most                 recent 04/19/2015. Risk Factors:Hypertension, Diabetes,                 Dyslipidemia and Sleep Apnea.  Sonographer:    Raquel Sarna Senior RDCS Referring Phys: [id number] [address]  1. Left ventricular ejection fraction, by estimation, is 70 to 75%. The left ventricle has hyperdynamic function. The left ventricle has no regional wall motion abnormalities. Left ventricular diastolic parameters are consistent with Grade I diastolic dysfunction (impaired relaxation).  2. Right ventricular systolic function is normal. The right ventricular size is normal. There is normal pulmonary artery systolic pressure.  3. The mitral valve is normal in structure. Trivial mitral valve regurgitation. No evidence of mitral stenosis.  4. The aortic valve is tricuspid. Aortic valve regurgitation is not visualized. No aortic stenosis is present.  5. The inferior vena cava is normal in size with greater than 50% respiratory variability, suggesting right atrial pressure of 3 mmHg. FINDINGS  Left Ventricle: Left  ventricular ejection fraction, by estimation, is 70 to 75%. The left ventricle has hyperdynamic function. The left ventricle has no regional wall motion abnormalities. The left ventricular internal cavity size was normal in size. There is no left ventricular hypertrophy. Left ventricular diastolic parameters are consistent with Grade I diastolic dysfunction (impaired relaxation). Right Ventricle: The right ventricular size is normal.Right ventricular systolic function is normal. There is normal pulmonary artery systolic pressure. The tricuspid regurgitant velocity is 2.35 m/s, and with an assumed right atrial pressure of 3 mmHg, the estimated right ventricular systolic pressure is 58.8 mmHg. Left Atrium: Left atrial size was normal in size. Right Atrium: Right atrial size was normal in size. Pericardium: There is no evidence of pericardial effusion. Mitral Valve: The mitral valve is normal in structure. Normal mobility of the mitral valve leaflets. Trivial mitral valve regurgitation. No evidence of mitral valve stenosis. Tricuspid Valve: The tricuspid valve is normal in structure. Tricuspid valve regurgitation is trivial. No evidence of tricuspid stenosis. Aortic Valve: The aortic valve is tricuspid. Aortic valve regurgitation is not visualized. No aortic stenosis is present. Pulmonic Valve: The pulmonic valve was not well visualized. Pulmonic valve regurgitation is not visualized. No evidence of pulmonic stenosis. Aorta: The aortic root is normal in size and structure. Venous: The inferior vena cava is normal in size with greater than 50% respiratory variability, suggesting right atrial pressure of 3 mmHg. IAS/Shunts: No atrial level shunt detected by color flow Doppler.  LEFT VENTRICLE PLAX 2D LVIDd:         3.14 cm  Diastology  LVIDs:         1.99 cm  LV e' lateral:   8.81 cm/s LV PW:         1.10 cm  LV E/e' lateral: 6.6 LV IVS:        1.15 cm  LV e' medial:    6.64 cm/s LVOT diam:     2.10 cm  LV E/e' medial:   8.8 LV SV:         60 LV SV Index:   32 LVOT Area:     3.46 cm  RIGHT VENTRICLE RV S prime:     14.90 cm/s TAPSE (M-mode): 2.2 cm LEFT ATRIUM             Index       RIGHT ATRIUM           Index LA diam:        3.70 cm 1.99 cm/m  RA Area:     16.10 cm LA Vol (A2C):   43.8 ml 23.60 ml/m RA Volume:   36.50 ml  19.67 ml/m LA Vol (A4C):   50.2 ml 27.05 ml/m LA Biplane Vol: 48.3 ml 26.02 ml/m  AORTIC VALVE LVOT Vmax:   86.50 cm/s LVOT Vmean:  56.500 cm/s LVOT VTI:    0.174 m  AORTA Ao Root diam: 3.50 cm MITRAL VALVE               TRICUSPID VALVE MV Area (PHT): 3.27 cm    TR Peak grad:   22.1 mmHg MV Decel Time: 232 msec    TR Vmax:        235.00 cm/s MV E velocity: 58.20 cm/s MV A velocity: 85.40 cm/s  SHUNTS MV E/A ratio:  0.68        Systemic VTI:  0.17 m                            Systemic Diam: 2.10 cm Kirk Ruths MD Electronically signed by Kirk Ruths MD Signature Date/Time: 07/25/2019/3:40:46 PM    Final    [place] EKG SITE RITE  Result Date: 07/25/2019 If Site Rite image not attached, placement could not be confirmed due to current cardiac rhythm.       Scheduled Meds:  enoxaparin (LOVENOX) injection  40 mg Subcutaneous Q24H   insulin aspart  0-6 Units Subcutaneous Q6H   pantoprazole (PROTONIX) IV  40 mg Intravenous Q12H   sodium chloride flush  3 mL Intravenous Once   sodium chloride flush  3 mL Intravenous Q12H   Continuous Infusions:  dextrose 5% lactated ringers with KCl 20 mEq/L 75 mL/hr at 07/26/19 1540   methocarbamol (ROBAXIN) IV       LOS: 3 days    Time spent: 25 minutes    Edwin Dada, MD [facility] 07/26/2019, 10:16 PM     Please page though [place] or Epic secure chat:  For [employer], Adult nurse

## 2019-07-26 NOTE — Progress Notes (Signed)
[facility] Progress Note     Subjective: CC-  NG tube clamped since yesterday. Patient reports having some acid reflux, but denies nausea or vomiting. Denies worsening abdominal distension. Passing flatus and continues to have loose stool. Per RN his stool is still somewhat mucusy.  Objective: Vital signs in last 24 hours: Temp:  [98.1 F (36.7 C)-99 F (37.2 C)] 98.1 F (36.7 C) (07/13 0618) Pulse Rate:  [80-86] 83 (07/13 0618) Resp:  [15-18] 16 (07/13 0618) BP: (120-139)/(77-85) 120/77 (07/13 0618) SpO2:  [94 %-98 %] 94 % (07/13 0618) Weight:  [73.6 kg] 73.6 kg (07/12 1344) Last BM Date: 07/26/19  Intake/Output from previous day: 07/12 0701 - 07/13 0700 In: 3907.1 [P.O.:960; I.V.:2947.1] Out: 0  Intake/Output this shift: No intake/output data recorded.  PE: Gen: Alert, NAD, pleasant HEENT: EOM's intact, pupils equal and round Card: RRR Pulm: CTAB, no W/R/R, rate and effort normal Abd: Soft,mild distension, +BS, no HSM, no hernia, nontender Psych: A&Ox3 Skin: no rashes noted, warm and dry  Lab Results:  Recent Labs    07/25/19 0246 07/26/19 0226  WBC 6.8 9.4  HGB 14.2 13.3  HCT 43.4 40.5  PLT 240 245   BMET Recent Labs    07/25/19 0246 07/26/19 0226  NA 142 141  K 4.5 4.0  CL 104 103  CO2 28 28  GLUCOSE 128* 135*  BUN 20 11  CREATININE 1.10 1.05  CALCIUM 9.1 8.9   PT/INR Recent Labs    07/24/19 0937  LABPROT 13.8  INR 1.1   CMP     Component Value Date/Time   NA 141 07/26/2019 0226   K 4.0 07/26/2019 0226   CL 103 07/26/2019 0226   CO2 28 07/26/2019 0226   GLUCOSE 135 (H) 07/26/2019 0226   GLUCOSE 118 (H) 12/08/2005 0741   BUN 11 07/26/2019 0226   CREATININE 1.05 07/26/2019 0226   CREATININE 1.11 04/02/2015 1514   CALCIUM 8.9 07/26/2019 0226   PROT 6.0 (L) 07/26/2019 0226   PROT 6.6 03/26/2016 0736   ALBUMIN 2.7 (L) 07/26/2019 0226   ALBUMIN 4.0 03/26/2016 0736   AST 24 07/26/2019 0226   ALT 43 07/26/2019 0226    ALKPHOS 112 07/26/2019 0226   BILITOT 1.2 07/26/2019 0226   BILITOT 0.3 03/26/2016 0736   GFRNONAA >60 07/26/2019 0226   GFRAA >60 07/26/2019 0226   Lipase     Component Value Date/Time   LIPASE 22 07/23/2019 0957       Studies/Results: DG Abd 1 View  Result Date: 07/25/2019 CLINICAL DATA:  Bowel obstruction. EXAM: ABDOMEN - 1 VIEW COMPARISON:  The CT abdomen pelvis-07/23/2019 FINDINGS: There is moderate to marked gaseous distention of multiple loops of predominantly small bowel with index loop of small bowel in the left mid hemiabdomen measuring 3.5 cm in diameter. A small amount of air is seen within the ascending and transverse colon however otherwise series a conspicuous paucity of distal colonic gas. No supine evidence of pneumoperitoneum. No pneumatosis or portal venous gas Enteric tip and side port projected the expected location of in by the stomach Limited visualization of the lower thorax demonstrates mild elevation of the right hemidiaphragm. A punctate granuloma overlies the left lower lobe with associated partially calcified left hilar lymph nodes, the sequela of previous granulomatous infection. Excreted contrast is seen within the urinary bladder. Mild to moderate multilevel lumbar spine DDD is suspected though incompletely evaluated. IMPRESSION: 1. Findings most suggestive of small-bowel obstruction though note, a distal colonic lesion  was identified on abdominal CT performed 07/23/2019. 2. Enteric tube tip and side port project over the expected location of the mid body of the stomach. Electronically Signed   By: Sandi Mariscal M.D.   On: 07/25/2019 07:08   CT CHEST W CONTRAST  Result Date: 07/24/2019 CLINICAL DATA:  Obstructing sigmoid colon mass. Evaluate for metastatic disease. EXAM: CT CHEST WITH CONTRAST TECHNIQUE: Multidetector CT imaging of the chest was performed during intravenous contrast administration. CONTRAST:  53mL OMNIPAQUE IOHEXOL 300 MG/ML  SOLN COMPARISON:  CT  abdomen/pelvis 07/23/2019 FINDINGS: Cardiovascular: The heart is normal in size. No pericardial effusion. The aorta is normal in caliber. No dissection. Minimal atherosclerotic calcification at the aortic arch. Branch vessels are patent. Advanced three-vessel coronary artery calcifications are noted. Mediastinum/Nodes: No mediastinal or hilar mass or lymphadenopathy. The esophagus is grossly normal. There is an NG tube coursing down the esophagus and into the stomach. Lungs/Pleura: No worrisome pulmonary lesions to suggest metastatic disease no acute pulmonary findings. No pleural effusions. Streaky right basilar atelectasis is noted. Upper Abdomen: Persistent dilated small bowel and colon. No free air. The gallbladder is mildly distended. No hepatic lesions are identified. Musculoskeletal: No significant bony findings. IMPRESSION: 1. No CT findings for pulmonary metastatic disease. 2. No mediastinal or hilar mass or adenopathy. 3. Advanced three-vessel coronary artery calcifications. 4. Persistent dilated small bowel and colon. 5. Aortic atherosclerosis. Aortic Atherosclerosis (ICD10-I70.0). Electronically Signed   By: Marijo Sanes M.D.   On: 07/24/2019 13:06   DG Abd Portable 1V  Result Date: 07/26/2019 CLINICAL DATA:  Bowel obstruction EXAM: PORTABLE ABDOMEN - 1 VIEW COMPARISON:  July 25, 2019 abdominal radiograph and CT abdomen and pelvis July 23, 2019 FINDINGS: Nasogastric tube tip and side port in stomach. Multiple loops of dilated bowel remain with bowel dilatation of up to 4.1 cm. No significant colonic dilatation seen currently. No appreciable free air. No abnormal calcifications. IMPRESSION: Nasogastric tube tip and side port in stomach. There is persistent predominantly small bowel dilatation, felt to represent a degree of bowel obstruction. No free air appreciable on supine examination. Electronically Signed   By: Lowella Grip III M.D.   On: 07/26/2019 08:07   ECHOCARDIOGRAM  COMPLETE  Result Date: 07/25/2019    ECHOCARDIOGRAM REPORT   Patient Name:   JEROMEY KRUER Date of Exam: 07/25/2019 Medical Rec #:  [id number]          Height:       66.0 in Accession #:    [id number]         Weight:       167.8 lb Date of Birth:  1945/07/07          BSA:          1.856 m Patient Age:    5 years           BP:           122/81 mmHg Patient Gender: M                  HR:           78 bpm. Exam Location:  Inpatient Procedure: 2D Echo, Color Doppler and Cardiac Doppler Indications:    Pre-Op Evaluation Z01.810  History:        Patient has prior history of Echocardiogram examinations, most                 recent 04/19/2015. Risk Factors:Hypertension, Diabetes,  Dyslipidemia and Sleep Apnea.  Sonographer:    Raquel Sarna Senior RDCS Referring Phys: [id number]  1. Left ventricular ejection fraction, by estimation, is 70 to 75%. The left ventricle has hyperdynamic function. The left ventricle has no regional wall motion abnormalities. Left ventricular diastolic parameters are consistent with Grade I diastolic dysfunction (impaired relaxation).  2. Right ventricular systolic function is normal. The right ventricular size is normal. There is normal pulmonary artery systolic pressure.  3. The mitral valve is normal in structure. Trivial mitral valve regurgitation. No evidence of mitral stenosis.  4. The aortic valve is tricuspid. Aortic valve regurgitation is not visualized. No aortic stenosis is present.  5. The inferior vena cava is normal in size with greater than 50% respiratory variability, suggesting right atrial pressure of 3 mmHg. FINDINGS  Left Ventricle: Left ventricular ejection fraction, by estimation, is 70 to 75%. The left ventricle has hyperdynamic function. The left ventricle has no regional wall motion abnormalities. The left ventricular internal cavity size was normal in size. There is no left ventricular hypertrophy. Left ventricular diastolic  parameters are consistent with Grade I diastolic dysfunction (impaired relaxation). Right Ventricle: The right ventricular size is normal.Right ventricular systolic function is normal. There is normal pulmonary artery systolic pressure. The tricuspid regurgitant velocity is 2.35 m/s, and with an assumed right atrial pressure of 3 mmHg, the estimated right ventricular systolic pressure is 97.9 mmHg. Left Atrium: Left atrial size was normal in size. Right Atrium: Right atrial size was normal in size. Pericardium: There is no evidence of pericardial effusion. Mitral Valve: The mitral valve is normal in structure. Normal mobility of the mitral valve leaflets. Trivial mitral valve regurgitation. No evidence of mitral valve stenosis. Tricuspid Valve: The tricuspid valve is normal in structure. Tricuspid valve regurgitation is trivial. No evidence of tricuspid stenosis. Aortic Valve: The aortic valve is tricuspid. Aortic valve regurgitation is not visualized. No aortic stenosis is present. Pulmonic Valve: The pulmonic valve was not well visualized. Pulmonic valve regurgitation is not visualized. No evidence of pulmonic stenosis. Aorta: The aortic root is normal in size and structure. Venous: The inferior vena cava is normal in size with greater than 50% respiratory variability, suggesting right atrial pressure of 3 mmHg. IAS/Shunts: No atrial level shunt detected by color flow Doppler.  LEFT VENTRICLE PLAX 2D LVIDd:         3.14 cm  Diastology LVIDs:         1.99 cm  LV e' lateral:   8.81 cm/s LV PW:         1.10 cm  LV E/e' lateral: 6.6 LV IVS:        1.15 cm  LV e' medial:    6.64 cm/s LVOT diam:     2.10 cm  LV E/e' medial:  8.8 LV SV:         60 LV SV Index:   32 LVOT Area:     3.46 cm  RIGHT VENTRICLE RV S prime:     14.90 cm/s TAPSE (M-mode): 2.2 cm LEFT ATRIUM             Index       RIGHT ATRIUM           Index LA diam:        3.70 cm 1.99 cm/m  RA Area:     16.10 cm LA Vol (A2C):   43.8 ml 23.60 ml/m RA  Volume:   36.50 ml  19.67 ml/m LA  Vol (A4C):   50.2 ml 27.05 ml/m LA Biplane Vol: 48.3 ml 26.02 ml/m  AORTIC VALVE LVOT Vmax:   86.50 cm/s LVOT Vmean:  56.500 cm/s LVOT VTI:    0.174 m  AORTA Ao Root diam: 3.50 cm MITRAL VALVE               TRICUSPID VALVE MV Area (PHT): 3.27 cm    TR Peak grad:   22.1 mmHg MV Decel Time: 232 msec    TR Vmax:        235.00 cm/s MV E velocity: 58.20 cm/s MV A velocity: 85.40 cm/s  SHUNTS MV E/A ratio:  0.68        Systemic VTI:  0.17 m                            Systemic Diam: 2.10 cm Kirk Ruths MD Electronically signed by Kirk Ruths MD Signature Date/Time: 07/25/2019/3:40:46 PM    Final    [place] EKG SITE RITE  Result Date: 07/25/2019 If Site Rite image not attached, placement could not be confirmed due to current cardiac rhythm.  [place] EKG SITE RITE  Result Date: 07/24/2019 If Site Rite image not attached, placement could not be confirmed due to current cardiac rhythm.   Anti-infectives: Anti-infectives (From admission, onward)   None       Assessment/Plan HTN HLD CAD s/p PCI 2001 and 2016 (on plavix, last dose 7/9)- hold plavix. ECHO 7/12 shows EF 70-75%. Cardiology reports patient is moderate cardiovascular risk DM - A1c 6.2 OSA Malnutrition - prealbumin 10.5 (7/13). Tolerating PO, after flex sig we may be able to start some nutritional supplements by mouth rather than TPN AKI - Cr 1.05, resolved  Partially obstructing sigmoid lesion/mass -CT abd/pelvis with no findings for locoregional adenopathy or distant metastatic disease - CT chest negative for metastatic disease - CEA 3.8  ID -none FEN -IVF, NGT clamped, NPO for procedure VTE -SCDs, lovenox Foley -none Follow up -TBD  Plan- Flexible sigmoidoscopy today. Patient did tolerate bowel prep without n/v, but it sounds like his stool are still mucusy and not clear.    LOS: 3 days    [facility] 07/26/2019, 8:11 AM Please see Amion for  pager number during day hours 7:00am-4:30pm

## 2019-07-26 NOTE — Progress Notes (Addendum)
   Patient Name: Roy [name] Date of Encounter: 07/26/2019, 9:21 AM    Subjective  Rough night - lack of sleep, heartburn, headache Stools liquid and look clear to me now No abdominal pain He has taken about 3/4 of Golytely   Objective  BP 120/77   Pulse 83   Temp 98.1 F (36.7 C) (Oral)   Resp 16   Ht 5\' 6"  (1.676 m)   Wt 73.6 kg   SpO2 94%   BMI 26.18 kg/m  NAD abd soft NT distended   Lab Results  Component Value Date   CREATININE 1.05 07/26/2019   BUN 11 07/26/2019   NA 141 07/26/2019   K 4.0 07/26/2019   CL 103 07/26/2019   CO2 28 07/26/2019   Lab Results  Component Value Date   WBC 9.4 07/26/2019   HGB 13.3 07/26/2019   HCT 40.5 07/26/2019   MCV 89.2 07/26/2019   PLT 245 07/26/2019   DG Abd Portable 1V CLINICAL DATA:  Bowel obstruction  EXAM: PORTABLE ABDOMEN - 1 VIEW  COMPARISON:  July 25, 2019 abdominal radiograph and CT abdomen and pelvis July 23, 2019  FINDINGS: Nasogastric tube tip and side port in stomach. Multiple loops of dilated bowel remain with bowel dilatation of up to 4.1 cm. No significant colonic dilatation seen currently. No appreciable free air. No abnormal calcifications.  IMPRESSION: Nasogastric tube tip and side port in stomach. There is persistent predominantly small bowel dilatation, felt to represent a degree of bowel obstruction. No free air appreciable on supine examination.  Electronically Signed   By: Lowella Grip III M.D.   On: 07/26/2019 08:07      Assessment and Plan  Sigmoid colon mass causing partial large bowel obstruction - dilated small bowel on xray He tolerated the prep.  I can do flex sig and attempt colonic stent if desired by surgery, if they feel necessary. I am not sure he needs that based upon the prep results but can provide this service if requested. We are waiting on the stent to arrive and tentative time for sigmoidoscopy is 1 PM but depends upon shipping    Gatha Mayer, MD, [facility] 07/26/2019 9:21 AM  D/w Ms. Meuth and Dr. Bobbye Morton  I will do flex sig and triage - if scope will go through no stent - if it won't will stent most likely  Gatha Mayer, MD, [facility] 07/26/2019 9:30 AM

## 2019-07-26 NOTE — Plan of Care (Signed)
°  Problem: Clinical Measurements: °Goal: Ability to maintain clinical measurements within normal limits will improve °Outcome: Progressing °  °Problem: Activity: °Goal: Risk for activity intolerance will decrease °Outcome: Not Progressing °  °Problem: Nutrition: °Goal: Adequate nutrition will be maintained °Outcome: Not Progressing °  °

## 2019-07-27 DIAGNOSIS — R197 Diarrhea, unspecified: Secondary | ICD-10-CM

## 2019-07-27 LAB — MAGNESIUM: Magnesium: 1.7 mg/dL (ref 1.7–2.4)

## 2019-07-27 LAB — CBC WITH DIFFERENTIAL/PLATELET
Abs Immature Granulocytes: 0.04 10*3/uL (ref 0.00–0.07)
Basophils Absolute: 0 10*3/uL (ref 0.0–0.1)
Basophils Relative: 0 %
Eosinophils Absolute: 0.1 10*3/uL (ref 0.0–0.5)
Eosinophils Relative: 1 %
HCT: 36.6 % — ABNORMAL LOW (ref 39.0–52.0)
Hemoglobin: 12.5 g/dL — ABNORMAL LOW (ref 13.0–17.0)
Immature Granulocytes: 1 %
Lymphocytes Relative: 11 %
Lymphs Abs: 0.8 10*3/uL (ref 0.7–4.0)
MCH: 29.6 pg (ref 26.0–34.0)
MCHC: 34.2 g/dL (ref 30.0–36.0)
MCV: 86.5 fL (ref 80.0–100.0)
Monocytes Absolute: 0.9 10*3/uL (ref 0.1–1.0)
Monocytes Relative: 12 %
Neutro Abs: 5.7 10*3/uL (ref 1.7–7.7)
Neutrophils Relative %: 75 %
Platelets: 198 10*3/uL (ref 150–400)
RBC: 4.23 MIL/uL (ref 4.22–5.81)
RDW: 12.7 % (ref 11.5–15.5)
WBC: 7.6 10*3/uL (ref 4.0–10.5)
nRBC: 0 % (ref 0.0–0.2)

## 2019-07-27 LAB — SURGICAL PATHOLOGY

## 2019-07-27 LAB — COMPREHENSIVE METABOLIC PANEL
ALT: 73 U/L — ABNORMAL HIGH (ref 0–44)
AST: 50 U/L — ABNORMAL HIGH (ref 15–41)
Albumin: 2.2 g/dL — ABNORMAL LOW (ref 3.5–5.0)
Alkaline Phosphatase: 95 U/L (ref 38–126)
Anion gap: 10 (ref 5–15)
BUN: 7 mg/dL — ABNORMAL LOW (ref 8–23)
CO2: 20 mmol/L — ABNORMAL LOW (ref 22–32)
Calcium: 8.2 mg/dL — ABNORMAL LOW (ref 8.9–10.3)
Chloride: 103 mmol/L (ref 98–111)
Creatinine, Ser: 0.83 mg/dL (ref 0.61–1.24)
GFR calc Af Amer: >60 mL/min (ref >60)
GFR calc non Af Amer: >60 mL/min (ref >60)
Glucose, Bld: 114 mg/dL — ABNORMAL HIGH (ref 70–99)
Potassium: 2.6 mmol/L — CL (ref 3.5–5.1)
Sodium: 133 mmol/L — ABNORMAL LOW (ref 135–145)
Total Bilirubin: 0.9 mg/dL (ref 0.3–1.2)
Total Protein: 5.3 g/dL — ABNORMAL LOW (ref 6.5–8.1)

## 2019-07-27 LAB — GLUCOSE, CAPILLARY
Glucose-Capillary: 112 mg/dL — ABNORMAL HIGH (ref 70–99)
Glucose-Capillary: 92 mg/dL (ref 70–99)
Glucose-Capillary: 92 mg/dL (ref 70–99)

## 2019-07-27 LAB — POTASSIUM: Potassium: 2.8 mmol/L — ABNORMAL LOW (ref 3.5–5.1)

## 2019-07-27 MED ORDER — DICYCLOMINE HCL 20 MG PO TABS
20.0000 mg | ORAL_TABLET | Freq: Four times a day (QID) | ORAL | Status: DC | PRN
  Filled 2019-07-27: qty 1

## 2019-07-27 MED ORDER — LOPERAMIDE HCL 2 MG PO CAPS
4.0000 mg | ORAL_CAPSULE | Freq: Three times a day (TID) | ORAL | Status: DC
  Administered 2019-07-27 – 2019-07-28 (×2): 4 mg via ORAL
  Filled 2019-07-27 (×2): qty 2

## 2019-07-27 MED ORDER — POTASSIUM CHLORIDE CRYS ER 20 MEQ PO TBCR
40.0000 meq | EXTENDED_RELEASE_TABLET | Freq: Once | ORAL | Status: AC
  Administered 2019-07-27: 40 meq via ORAL
  Filled 2019-07-27: qty 2

## 2019-07-27 MED ORDER — POTASSIUM CHLORIDE 10 MEQ/100ML IV SOLN
10.0000 meq | INTRAVENOUS | Status: AC
  Administered 2019-07-27 (×6): 10 meq via INTRAVENOUS
  Filled 2019-07-27 (×6): qty 100

## 2019-07-27 NOTE — Progress Notes (Addendum)
Patient ID: Roy [name], male   DOB: July 16, 1945, 74 y.o.   MRN: [id number]  PROGRESS NOTE    Roy [name]  WCH:[id number] DOB: 1945-07-12 DOA: 07/23/2019 PCP: Roy Sanders, MD   Brief Narrative:  74 year old male with history of CAD status post PCI in 2001 and 2016, depression, hypertension and OSA presented with progressively worsening abdominal pain, bloating over a month along with 15 pounds unintentional weight loss over the last couple of months.  In the ED, creatinine was 1.65 from baseline 1.2 and CT of the abdomen and pelvis showed a 5 cm obstructing lesion in the sigmoid colon with small bowel obstruction.  Assessment & Plan:   Small bowel obstruction -Patient was admitted with small bowel obstruction with CT showing obstructing lesion in the sigmoid colon -Status post colonoscopy on 07/26/2019 by GI which did not show any mass/stricture -Follow-up CT after colonoscopy showed interval decompression of the small and large bowel, and also new development of circumferential large bowel wall thickening proximal to the location of the previously radiographically seen obstruction, consistent with inflammatory or infectious colitis -GI and general surgery following.  Currently still has NG tube.  Coronary artery disease Hypertension -Blood pressure stable.  Resume metoprolol, statin and ACE inhibitor when NG tube removed.  Plavix on hold: Resume on discharge  AKI on CKD stage IIIa -Baseline creatinine 1.2, admitted with creatinine 1.6 -Resolved with hydration  Depression -Restart Effexor when able to take p.o.  Prediabetes -A1c 6.2.  Continue twice daily glucose checks  Sleep apnea -Patient does not use CPAP  Severe malnutrition--follow nutrition recommendations   DVT prophylaxis: Lovenox Code Status: Full Family Communication: Patient at bedside Disposition Plan: Status is: Inpatient  Remains inpatient appropriate because:IV treatments appropriate due to  intensity of illness or inability to take PO   Dispo: The patient is from: Home              Anticipated d/c is to: Home              Anticipated d/c date is: 2 days              Patient currently is not medically stable to d/c.    Consultants: GI/general surgery  Procedures:  Colonoscopy on 07/26/2019 Impression:               - Congested mucosa in the proximal sigmoid colon.                            Biopsied. NO STRICTURE, NO COLON CANCER SEEN AS                            SUSPECTED BY CT SCAN - ? IF HE HAD A HIGH IMPACTION?                           - Diverticulosis in the sigmoid colon and in the                            descending colon.                           - The examination was otherwise normal on direct  and retroflexion views.                           Note was planned as a flex sig and convereted to                            colonoscopy based upon lack of mass/stricture and                            clinical scenario Recommendation:           - Return patient to hospital ward for ongoing care.                           - Clear liquid diet.                           - REPEAT A CT SCAN ABD AND PELVIS                           - Await pathology results  Antimicrobials: None   Subjective: Patient seen and examined at bedside.  He does not feel well; feels weak and complains of chest congestion.  He is having a lot of diarrhea; complains of frequent urination.  Objective: Vitals:   07/26/19 1343 07/26/19 2127 07/27/19 0006 07/27/19 0518  BP: 124/86 139/89 119/82 107/80  Pulse: 73 89 (!) 102 88  Resp: 16 18 18 18   Temp: (!) 97.5 F (36.4 C) 98.9 F (37.2 C) 99.5 F (37.5 C) 98.6 F (37 C)  TempSrc: Oral Oral Oral Oral  SpO2: 97% 98% 96% 95%  Weight:      Height:        Intake/Output Summary (Last 24 hours) at 07/27/2019 0912 Last data filed at 07/27/2019 0600 Gross per 24 hour  Intake 1388.1 ml  Output --  Net 1388.1 ml    Filed Weights   07/24/19 1700 07/25/19 1344  Weight: 76.1 kg 73.6 kg    Examination:  General exam: Appears calm and comfortable.  Elderly male lying in bed. ENT: NG tube present Respiratory system: Bilateral decreased breath sounds at bases with scattered crackles Cardiovascular system: S1 & S2 heard, intermittently tachycardic  gastrointestinal system: Abdomen is nondistended, soft and nontender.  Bowel sounds sluggish  extremities: No cyanosis, clubbing; trace lower extremity edema Central nervous system: Alert and oriented. No focal neurological deficits. Moving extremities Skin: No rashes, lesions or ulcers Psychiatry: Looks anxious   Data Reviewed: I have personally reviewed following labs and imaging studies  CBC: Recent Labs  Lab 07/23/19 0957 07/24/19 0144 07/25/19 0246 07/26/19 0226 07/27/19 0404  WBC 17.1*  17.7* 9.8 6.8 9.4 7.6  NEUTROABS 13.5*  --  4.4 7.0 5.7  HGB 16.6  16.4 14.8 14.2 13.3 12.5*  HCT 48.7  48.2 43.7 43.4 40.5 36.6*  MCV 87.1  86.2 87.1 89.9 89.2 86.5  PLT 413*  385 290 240 245 086   Basic Metabolic Panel: Recent Labs  Lab 07/23/19 0957 07/23/19 0957 07/24/19 0144 07/25/19 0246 07/26/19 0226 07/27/19 0404 07/27/19 0721  NA 138  --  139 142 141 133*  --   K 4.0   < > 4.5 4.5 4.0 2.6* 2.8*  CL 95*  --  99 104 103  103  --   CO2 24  --  29 28 28  20*  --   GLUCOSE 221*  --  151* 128* 135* 114*  --   BUN 28*  --  27* 20 11 7*  --   CREATININE 1.65*  --  1.14 1.10 1.05 0.83  --   CALCIUM 10.0  --  9.2 9.1 8.9 8.2*  --   MG  --   --   --  2.1 1.9 1.7  --   PHOS  --   --   --  2.7 2.5  --   --    < > = values in this interval not displayed.   GFR: Estimated Creatinine Clearance: 71.5 mL/min (by C-G formula based on SCr of 0.83 mg/dL). Liver Function Tests: Recent Labs  Lab 07/23/19 0957 07/25/19 0246 07/26/19 0226 07/27/19 0404  AST 30 23 24  50*  ALT 42 32 43 73*  ALKPHOS 103 72 112 95  BILITOT 1.2 1.1 1.2 0.9  PROT  7.5 6.0* 6.0* 5.3*  ALBUMIN 3.4* 2.8* 2.7* 2.2*   Recent Labs  Lab 07/23/19 0957  LIPASE 22   No results for input(s): AMMONIA in the last 168 hours. Coagulation Profile: Recent Labs  Lab 07/24/19 0937  INR 1.1   Cardiac Enzymes: No results for input(s): CKTOTAL, CKMB, CKMBINDEX, TROPONINI in the last 168 hours. BNP (last 3 results) No results for input(s): PROBNP in the last 8760 hours. HbA1C: No results for input(s): HGBA1C in the last 72 hours. CBG: Recent Labs  Lab 07/26/19 0631 07/26/19 0755 07/26/19 1617 07/27/19 0003 07/27/19 0516  GLUCAP 121* 117* 155* 92 112*   Lipid Profile: Recent Labs    07/25/19 0246 07/26/19 0226  TRIG 130 120   Thyroid Function Tests: No results for input(s): TSH, T4TOTAL, FREET4, T3FREE, THYROIDAB in the last 72 hours. Anemia Panel: No results for input(s): VITAMINB12, FOLATE, FERRITIN, TIBC, IRON, RETICCTPCT in the last 72 hours. Sepsis Labs: No results for input(s): PROCALCITON, LATICACIDVEN in the last 168 hours.  Recent Results (from the past 240 hour(s))  SARS Coronavirus 2 by RT PCR (hospital order, performed in [facility] hospital lab) Nasopharyngeal Nasopharyngeal Swab     Status: None   Collection Time: 07/23/19  3:24 PM   Specimen: Nasopharyngeal Swab  Result Value Ref Range Status   SARS Coronavirus 2 NEGATIVE NEGATIVE Final    Comment: (NOTE) SARS-CoV-2 target nucleic acids are NOT DETECTED.  The SARS-CoV-2 RNA is generally detectable in upper and lower respiratory specimens during the acute phase of infection. The lowest concentration of SARS-CoV-2 viral copies this assay can detect is 250 copies / mL. A negative result does not preclude SARS-CoV-2 infection and should not be used as the sole basis for treatment or other patient management decisions.  A negative result may occur with improper specimen collection / handling, submission of specimen other than nasopharyngeal swab, presence of viral mutation(s)  within the areas targeted by this assay, and inadequate number of viral copies (<250 copies / mL). A negative result must be combined with clinical observations, patient history, and epidemiological information.  Fact Sheet for Patients:   StrictlyIdeas.no  Fact Sheet for Healthcare Providers: BankingDealers.co.za  This test is not yet approved or  cleared by the [place] FDA and has been authorized for detection and/or diagnosis of SARS-CoV-2 by FDA under an Emergency Use Authorization (EUA).  This EUA will remain in effect (meaning this test can be used) for the duration of the  COVID-19 declaration under Section 564(b)(1) of the Act, 21 U.S.C. section 360bbb-3(b)(1), unless the authorization is terminated or revoked sooner.  Performed at [facility], [place] [address], [place], [place] [address]          Radiology Studies: CT ABDOMEN PELVIS W CONTRAST  Result Date: 07/26/2019 CLINICAL DATA:  Sigmoid mass, bowel obstruction EXAM: CT ABDOMEN AND PELVIS WITH CONTRAST TECHNIQUE: Multidetector CT imaging of the abdomen and pelvis was performed using the standard protocol following bolus administration of intravenous contrast. CONTRAST:  135mL OMNIPAQUE IOHEXOL 300 MG/ML  SOLN COMPARISON:  07/23/2019 FINDINGS: Lower chest: Right basilar atelectasis. 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 Nasogastric tube is seen extending into the mid to distal body of the stomach. Right coronary artery calcification and probable coronary artery stenting has been performed. Global cardiac size within normal limits. Hepatobiliary: Tiny hypodensity within the right hepatic dome, axial image # 8/3, is again seen and likely represents a tiny a Paddock cyst, though is too small to definitively characterize. The liver is otherwise unremarkable. The gallbladder is unremarkable. Pancreas:  Unremarkable Spleen: Unremarkable Adrenals/Urinary Tract: The adrenal glands are unremarkable. The kidneys are unremarkable. The bladder is largely decompressed and is unremarkable. Stomach/Bowel: The previously noted sigmoid mass is not well appreciated on this examination. There is, however, interval development of extensive circumferential bowel wall thickening and mucosal fold thickening involving the entire colon proximal to the point of obstruction within the sigmoid colon in keeping with an infectious or inflammatory colitis. There is normal enhancement of the colon. No pneumatosis or free intraperitoneal gas. The colon is largely decompressed. Similarly, there has been interval improvement in the caliber of the small bowel when compared to prior examination in keeping with improvement in large bowel obstruction, likely related to nasogastric decompression. No free intraperitoneal fluid. Appendix normal. Vascular/Lymphatic: There is no pathologic adenopathy within the abdomen and pelvis. The abdominal vasculature is age-appropriate with moderate aortoiliac atherosclerotic calcification. No aneurysm. Reproductive: Unremarkable Other: The rectum is unremarkable. Musculoskeletal: No lytic or blastic bone lesions are seen. Osseous structures are age-appropriate. IMPRESSION: Interval improvement in caliber of small bowel and decompression of the colon in keeping with near resolution of previously noted large bowel obstruction, likely related to nasogastric decompression. Interval development of a infectious or inflammatory colitis involving the entire colon proximal to the point of sigmoid obstruction. The obstructing mass is not well delineated on this examination. Tiny indeterminate lesion within the right hepatic dome likely represents a tiny cyst, the can be reassessed on subsequent examination. Aortic Atherosclerosis (ICD10-I70.0). Electronically Signed   By: Fidela Salisbury MD   On: 07/26/2019 21:35   DG  Abd Portable 1V  Result Date: 07/26/2019 CLINICAL DATA:  Bowel obstruction EXAM: PORTABLE ABDOMEN - 1 VIEW COMPARISON:  July 25, 2019 abdominal radiograph and CT abdomen and pelvis July 23, 2019 FINDINGS: Nasogastric tube tip and side port in stomach. Multiple loops of dilated bowel remain with bowel dilatation of up to 4.1 cm. No significant colonic dilatation seen currently. No appreciable free air. No abnormal calcifications. IMPRESSION: Nasogastric tube tip and side port in stomach. There is persistent predominantly small bowel dilatation, felt to represent a degree of bowel obstruction. No free air appreciable on supine examination. Electronically Signed   By: Lowella Grip III M.D.   On: 07/26/2019 08:07   ECHOCARDIOGRAM COMPLETE  Result Date: 07/25/2019    ECHOCARDIOGRAM REPORT   Patient Name:   Roy [name] [facility]  Date of Exam: 07/25/2019 Medical Rec #:  [id number]          Height:       66.0 in Accession #:    [id number]         Weight:       167.8 lb Date of Birth:  [birth year]          BSA:          1.856 m Patient Age:    4 years           BP:           122/81 mmHg Patient Gender: M                  HR:           78 bpm. Exam Location:  Inpatient Procedure: 2D Echo, Color Doppler and Cardiac Doppler Indications:    Pre-Op Evaluation Z01.810  History:        Patient has prior history of Echocardiogram examinations, most                 recent 04/19/2015. Risk Factors:Hypertension, Diabetes,                 Dyslipidemia and Sleep Apnea.  Sonographer:    Raquel Sarna Senior RDCS Referring Phys: [id number] [place]  1. Left ventricular ejection fraction, by estimation, is 70 to 75%. The left ventricle has hyperdynamic function. The left ventricle has no regional wall motion abnormalities. Left ventricular diastolic parameters are consistent with Grade I diastolic dysfunction (impaired relaxation).  2. Right ventricular systolic function is normal. The right ventricular size is normal.  There is normal pulmonary artery systolic pressure.  3. The mitral valve is normal in structure. Trivial mitral valve regurgitation. No evidence of mitral stenosis.  4. The aortic valve is tricuspid. Aortic valve regurgitation is not visualized. No aortic stenosis is present.  5. The inferior vena cava is normal in size with greater than 50% respiratory variability, suggesting right atrial pressure of 3 mmHg. FINDINGS  Left Ventricle: Left ventricular ejection fraction, by estimation, is 70 to 75%. The left ventricle has hyperdynamic function. The left ventricle has no regional wall motion abnormalities. The left ventricular internal cavity size was normal in size. There is no left ventricular hypertrophy. Left ventricular diastolic parameters are consistent with Grade I diastolic dysfunction (impaired relaxation). Right Ventricle: The right ventricular size is normal.Right ventricular systolic function is normal. There is normal pulmonary artery systolic pressure. The tricuspid regurgitant velocity is 2.35 m/s, and with an assumed right atrial pressure of 3 mmHg, the estimated right ventricular systolic pressure is 30.8 mmHg. Left Atrium: Left atrial size was normal in size. Right Atrium: Right atrial size was normal in size. Pericardium: There is no evidence of pericardial effusion. Mitral Valve: The mitral valve is normal in structure. Normal mobility of the mitral valve leaflets. Trivial mitral valve regurgitation. No evidence of mitral valve stenosis. Tricuspid Valve: The tricuspid valve is normal in structure. Tricuspid valve regurgitation is trivial. No evidence of tricuspid stenosis. Aortic Valve: The aortic valve is tricuspid. Aortic valve regurgitation is not visualized. No aortic stenosis is present. Pulmonic Valve: The pulmonic valve was not well visualized. Pulmonic valve regurgitation is not visualized. No evidence of pulmonic stenosis. Aorta: The aortic root is normal in size and structure. Venous:  The inferior vena cava is normal in size with greater than 50% respiratory variability, suggesting right atrial pressure of 3 mmHg. IAS/Shunts: No  atrial level shunt detected by color flow Doppler.  LEFT VENTRICLE PLAX 2D LVIDd:         3.14 cm  Diastology LVIDs:         1.99 cm  LV e' lateral:   8.81 cm/s LV PW:         1.10 cm  LV E/e' lateral: 6.6 LV IVS:        1.15 cm  LV e' medial:    6.64 cm/s LVOT diam:     2.10 cm  LV E/e' medial:  8.8 LV SV:         60 LV SV Index:   32 LVOT Area:     3.46 cm  RIGHT VENTRICLE RV S prime:     14.90 cm/s TAPSE (M-mode): 2.2 cm LEFT ATRIUM             Index       RIGHT ATRIUM           Index LA diam:        3.70 cm 1.99 cm/m  RA Area:     16.10 cm LA Vol (A2C):   43.8 ml 23.60 ml/m RA Volume:   36.50 ml  19.67 ml/m LA Vol (A4C):   50.2 ml 27.05 ml/m LA Biplane Vol: 48.3 ml 26.02 ml/m  AORTIC VALVE LVOT Vmax:   86.50 cm/s LVOT Vmean:  56.500 cm/s LVOT VTI:    0.174 m  AORTA Ao Root diam: 3.50 cm MITRAL VALVE               TRICUSPID VALVE MV Area (PHT): 3.27 cm    TR Peak grad:   22.1 mmHg MV Decel Time: 232 msec    TR Vmax:        235.00 cm/s MV E velocity: 58.20 cm/s MV A velocity: 85.40 cm/s  SHUNTS MV E/A ratio:  0.68        Systemic VTI:  0.17 m                            Systemic Diam: 2.10 cm Kirk Ruths MD Electronically signed by Kirk Ruths MD Signature Date/Time: 07/25/2019/3:40:46 PM    Final    [place] EKG SITE RITE  Result Date: 07/25/2019 If Site Rite image not attached, placement could not be confirmed due to current cardiac rhythm.       Scheduled Meds:  enoxaparin (LOVENOX) injection  40 mg Subcutaneous Q24H   insulin aspart  0-6 Units Subcutaneous Q6H   pantoprazole (PROTONIX) IV  40 mg Intravenous Q12H   sodium chloride flush  3 mL Intravenous Once   sodium chloride flush  3 mL Intravenous Q12H   Continuous Infusions:  methocarbamol (ROBAXIN) IV     potassium chloride 10 mEq (07/27/19 0905)          Aline August,  MD [facility] 07/27/2019, 9:12 AM

## 2019-07-27 NOTE — Progress Notes (Signed)
[facility] Progress Note  1 Day Post-Op  Subjective: CC-  Patient states that he feels the same. Continues to have some abdominal pain, although it is mild. Denies n/v. NG tube is still clamped and he is tolerating clear liquids. Having multiple loose stools. WBC 7.6, TMAX 100.1  On flex sig yesterday no mass or lesion was found, Congested mucosa in the proximal sigmoid colon that was biopsied. Repeat CT scan yesterday showed interval development of a infectious or inflammatory colitis involving the entire colon proximal to the point of sigmoid obstruction.   Objective: Vital signs in last 24 hours: Temp:  [97.5 F (36.4 C)-100.1 F (37.8 C)] 98.6 F (37 C) (07/14 0518) Pulse Rate:  [73-102] 88 (07/14 0518) Resp:  [16-19] 18 (07/14 0518) BP: (107-139)/(73-89) 107/80 (07/14 0518) SpO2:  [95 %-99 %] 95 % (07/14 0518) Last BM Date: 07/26/19  Intake/Output from previous day: 07/13 0701 - 07/14 0700 In: 1388.1 [P.O.:360; I.V.:1028.1] Out: -  Intake/Output this shift: No intake/output data recorded.  PE: Gen: Alert, NAD, pleasant HEENT: EOM's intact, pupils equal and round Card: RRR Pulm: CTAB, no W/R/R, rate and effort normal Abd: Soft,very mild distension, +BS, no HSM, no hernia,nontender Psych: A&Ox3 Skin: no rashes noted, warm and dry  Lab Results:  Recent Labs    07/26/19 0226 07/27/19 0404  WBC 9.4 7.6  HGB 13.3 12.5*  HCT 40.5 36.6*  PLT 245 198   BMET Recent Labs    07/26/19 0226 07/26/19 0226 07/27/19 0404 07/27/19 0721  NA 141  --  133*  --   K 4.0   < > 2.6* 2.8*  CL 103  --  103  --   CO2 28  --  20*  --   GLUCOSE 135*  --  114*  --   BUN 11  --  7*  --   CREATININE 1.05  --  0.83  --   CALCIUM 8.9  --  8.2*  --    < > = values in this interval not displayed.   PT/INR Recent Labs    07/24/19 0937  LABPROT 13.8  INR 1.1   CMP     Component Value Date/Time   NA 133 (L) 07/27/2019 0404   K 2.8 (L) 07/27/2019 0721    CL 103 07/27/2019 0404   CO2 20 (L) 07/27/2019 0404   GLUCOSE 114 (H) 07/27/2019 0404   GLUCOSE 118 (H) 12/08/2005 0741   BUN 7 (L) 07/27/2019 0404   CREATININE 0.83 07/27/2019 0404   CREATININE 1.11 04/02/2015 1514   CALCIUM 8.2 (L) 07/27/2019 0404   PROT 5.3 (L) 07/27/2019 0404   PROT 6.6 03/26/2016 0736   ALBUMIN 2.2 (L) 07/27/2019 0404   ALBUMIN 4.0 03/26/2016 0736   AST 50 (H) 07/27/2019 0404   ALT 73 (H) 07/27/2019 0404   ALKPHOS 95 07/27/2019 0404   BILITOT 0.9 07/27/2019 0404   BILITOT 0.3 03/26/2016 0736   GFRNONAA >60 07/27/2019 0404   GFRAA >60 07/27/2019 0404   Lipase     Component Value Date/Time   LIPASE 22 07/23/2019 0957       Studies/Results: CT ABDOMEN PELVIS W CONTRAST  Result Date: 07/26/2019 CLINICAL DATA:  Sigmoid mass, bowel obstruction EXAM: CT ABDOMEN AND PELVIS WITH CONTRAST TECHNIQUE: Multidetector CT imaging of the abdomen and pelvis was performed using the standard protocol following bolus administration of intravenous contrast. CONTRAST:  160mL OMNIPAQUE IOHEXOL 300 MG/ML  SOLN COMPARISON:  07/23/2019 FINDINGS: Lower chest: Right basilar atelectasis.  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 Nasogastric tube is seen extending into the mid to distal body of the stomach. Right coronary artery calcification and probable coronary artery stenting has been performed. Global cardiac size within normal limits. Hepatobiliary: Tiny hypodensity within the right hepatic dome, axial image # 8/3, is again seen and likely represents a tiny a Paddock cyst, though is too small to definitively characterize. The liver is otherwise unremarkable. The gallbladder is unremarkable. Pancreas: Unremarkable Spleen: Unremarkable Adrenals/Urinary Tract: The adrenal glands are unremarkable. The kidneys are unremarkable. The bladder is largely decompressed and is unremarkable. Stomach/Bowel: The previously noted sigmoid mass is  not well appreciated on this examination. There is, however, interval development of extensive circumferential bowel wall thickening and mucosal fold thickening involving the entire colon proximal to the point of obstruction within the sigmoid colon in keeping with an infectious or inflammatory colitis. There is normal enhancement of the colon. No pneumatosis or free intraperitoneal gas. The colon is largely decompressed. Similarly, there has been interval improvement in the caliber of the small bowel when compared to prior examination in keeping with improvement in large bowel obstruction, likely related to nasogastric decompression. No free intraperitoneal fluid. Appendix normal. Vascular/Lymphatic: There is no pathologic adenopathy within the abdomen and pelvis. The abdominal vasculature is age-appropriate with moderate aortoiliac atherosclerotic calcification. No aneurysm. Reproductive: Unremarkable Other: The rectum is unremarkable. Musculoskeletal: No lytic or blastic bone lesions are seen. Osseous structures are age-appropriate. IMPRESSION: Interval improvement in caliber of small bowel and decompression of the colon in keeping with near resolution of previously noted large bowel obstruction, likely related to nasogastric decompression. Interval development of a infectious or inflammatory colitis involving the entire colon proximal to the point of sigmoid obstruction. The obstructing mass is not well delineated on this examination. Tiny indeterminate lesion within the right hepatic dome likely represents a tiny cyst, the can be reassessed on subsequent examination. Aortic Atherosclerosis (ICD10-I70.0). Electronically Signed   By: Fidela Salisbury MD   On: 07/26/2019 21:35   DG Abd Portable 1V  Result Date: 07/26/2019 CLINICAL DATA:  Bowel obstruction EXAM: PORTABLE ABDOMEN - 1 VIEW COMPARISON:  July 25, 2019 abdominal radiograph and CT abdomen and pelvis July 23, 2019 FINDINGS: Nasogastric tube tip and side  port in stomach. Multiple loops of dilated bowel remain with bowel dilatation of up to 4.1 cm. No significant colonic dilatation seen currently. No appreciable free air. No abnormal calcifications. IMPRESSION: Nasogastric tube tip and side port in stomach. There is persistent predominantly small bowel dilatation, felt to represent a degree of bowel obstruction. No free air appreciable on supine examination. Electronically Signed   By: Lowella Grip III M.D.   On: 07/26/2019 08:07   ECHOCARDIOGRAM COMPLETE  Result Date: 07/25/2019    ECHOCARDIOGRAM REPORT   Patient Name:   RAIN FRIEDT Date of Exam: 07/25/2019 Medical Rec #:  [id number]          Height:       66.0 in Accession #:    [id number]         Weight:       167.8 lb Date of Birth:  1945/06/22          BSA:          1.856 m Patient Age:    74 years           BP:           122/81 mmHg Patient  Gender: M                  HR:           78 bpm. Exam Location:  Inpatient Procedure: 2D Echo, Color Doppler and Cardiac Doppler Indications:    Pre-Op Evaluation Z01.810  History:        Patient has prior history of Echocardiogram examinations, most                 recent 04/19/2015. Risk Factors:Hypertension, Diabetes,                 Dyslipidemia and Sleep Apnea.  Sonographer:    Raquel Sarna Senior RDCS Referring Phys: [id number] [place]  1. Left ventricular ejection fraction, by estimation, is 70 to 75%. The left ventricle has hyperdynamic function. The left ventricle has no regional wall motion abnormalities. Left ventricular diastolic parameters are consistent with Grade I diastolic dysfunction (impaired relaxation).  2. Right ventricular systolic function is normal. The right ventricular size is normal. There is normal pulmonary artery systolic pressure.  3. The mitral valve is normal in structure. Trivial mitral valve regurgitation. No evidence of mitral stenosis.  4. The aortic valve is tricuspid. Aortic valve regurgitation is not  visualized. No aortic stenosis is present.  5. The inferior vena cava is normal in size with greater than 50% respiratory variability, suggesting right atrial pressure of 3 mmHg. FINDINGS  Left Ventricle: Left ventricular ejection fraction, by estimation, is 70 to 75%. The left ventricle has hyperdynamic function. The left ventricle has no regional wall motion abnormalities. The left ventricular internal cavity size was normal in size. There is no left ventricular hypertrophy. Left ventricular diastolic parameters are consistent with Grade I diastolic dysfunction (impaired relaxation). Right Ventricle: The right ventricular size is normal.Right ventricular systolic function is normal. There is normal pulmonary artery systolic pressure. The tricuspid regurgitant velocity is 2.35 m/s, and with an assumed right atrial pressure of 3 mmHg, the estimated right ventricular systolic pressure is 10.2 mmHg. Left Atrium: Left atrial size was normal in size. Right Atrium: Right atrial size was normal in size. Pericardium: There is no evidence of pericardial effusion. Mitral Valve: The mitral valve is normal in structure. Normal mobility of the mitral valve leaflets. Trivial mitral valve regurgitation. No evidence of mitral valve stenosis. Tricuspid Valve: The tricuspid valve is normal in structure. Tricuspid valve regurgitation is trivial. No evidence of tricuspid stenosis. Aortic Valve: The aortic valve is tricuspid. Aortic valve regurgitation is not visualized. No aortic stenosis is present. Pulmonic Valve: The pulmonic valve was not well visualized. Pulmonic valve regurgitation is not visualized. No evidence of pulmonic stenosis. Aorta: The aortic root is normal in size and structure. Venous: The inferior vena cava is normal in size with greater than 50% respiratory variability, suggesting right atrial pressure of 3 mmHg. IAS/Shunts: No atrial level shunt detected by color flow Doppler.  LEFT VENTRICLE PLAX 2D LVIDd:          3.14 cm  Diastology LVIDs:         1.99 cm  LV e' lateral:   8.81 cm/s LV PW:         1.10 cm  LV E/e' lateral: 6.6 LV IVS:        1.15 cm  LV e' medial:    6.64 cm/s LVOT diam:     2.10 cm  LV E/e' medial:  8.8 LV SV:         60  LV SV Index:   32 LVOT Area:     3.46 cm  RIGHT VENTRICLE RV S prime:     14.90 cm/s TAPSE (M-mode): 2.2 cm LEFT ATRIUM             Index       RIGHT ATRIUM           Index LA diam:        3.70 cm 1.99 cm/m  RA Area:     16.10 cm LA Vol (A2C):   43.8 ml 23.60 ml/m RA Volume:   36.50 ml  19.67 ml/m LA Vol (A4C):   50.2 ml 27.05 ml/m LA Biplane Vol: 48.3 ml 26.02 ml/m  AORTIC VALVE LVOT Vmax:   86.50 cm/s LVOT Vmean:  56.500 cm/s LVOT VTI:    0.174 m  AORTA Ao Root diam: 3.50 cm MITRAL VALVE               TRICUSPID VALVE MV Area (PHT): 3.27 cm    TR Peak grad:   22.1 mmHg MV Decel Time: 232 msec    TR Vmax:        235.00 cm/s MV E velocity: 58.20 cm/s MV A velocity: 85.40 cm/s  SHUNTS MV E/A ratio:  0.68        Systemic VTI:  0.17 m                            Systemic Diam: 2.10 cm Kirk Ruths MD Electronically signed by Kirk Ruths MD Signature Date/Time: 07/25/2019/3:40:46 PM    Final    [place] EKG SITE RITE  Result Date: 07/25/2019 If Site Rite image not attached, placement could not be confirmed due to current cardiac rhythm.   Anti-infectives: Anti-infectives (From admission, onward)   None       Assessment/Plan HTN HLD CADs/p PCI 2001 and 2016(on plavix, last dose 7/9)- hold plavix. ECHO 7/12 shows EF 70-75%. Cardiology reports patient is moderate cardiovascular risk DM - A1c 6.2 OSA Malnutrition -prealbumin 10.5 (7/13). Tolerating PO, after flex sig we may be able to start some nutritional supplements by mouth rather than TPN AKI -Cr 0.83, resolved Hypokalemia  ?Partially obstructing sigmoid lesion/mass  >> no mass seen on flex sig 7/13, congested mucosa in the proximal sigmoid colon that was biopsied -CT abd/pelvis withno findings for  locoregional adenopathy or distant metastatic disease - CT chestnegative for metastatic disease -CEA 3.8  Colitis, infectious vs inflammatory - repeat CT 7/13 Interval development of a infectious or inflammatory colitis involving the entire colon proximal to the point of sigmoid obstruction  ID -none FEN - d/c NGIVF, CLD VTE -SCDs, lovenox Foley -none Follow up -TBD  Plan-No sigmoid mass seen on flex sig yesterday. He does not need surgery. Repeat CT scan consistent with colitis. Will defer treatment to GI/primary but I suspect he will need antibiotics, stool studies. He is no longer having obstructive symptoms and NG tube has been clamped >24hr, will d/c this today.   LOS: 4 days    Wellington Hampshire, [facility] 07/27/2019, 9:20 AM Please see Amion for pager number during day hours 7:00am-4:30pm

## 2019-07-27 NOTE — Progress Notes (Addendum)
Daily Rounding Note  07/27/2019, 12:29 PM  LOS: 4 days   SUBJECTIVE:   Chief complaint: bowel obstruction, sigmoid mass on imaging     Still some mild abd pain.  Tolerating clears, NGT pulled.  tmax to 100.1. Pulse to 102.    Loose BM's, better control of bowels but still fears incontinence.  Passing flatus  OBJECTIVE:         Vital signs in last 24 hours:    Temp:  [97.5 F (36.4 C)-99.5 F (37.5 C)] 98.6 F (37 C) (07/14 0518) Pulse Rate:  [73-102] 88 (07/14 0518) Resp:  [16-19] 18 (07/14 0518) BP: (107-139)/(73-89) 107/80 (07/14 0518) SpO2:  [95 %-99 %] 95 % (07/14 0518) Last BM Date: 07/27/19 Filed Weights   07/24/19 1700 07/25/19 1344  Weight: 76.1 kg 73.6 kg   General: looks somwhat ill.  Comfortable.   Facial Rubor   Heart: RRR Chest: clear bil.  + coough.  No dyspnea Abdomen: quiet BS.  NT.  ND.,  Soft.    Extremities: no CCE Neuro/Psych:  Alert.  HOH.  No tremors or weakness.      Lab Results: Recent Labs    07/25/19 0246 07/26/19 0226 07/27/19 0404  WBC 6.8 9.4 7.6  HGB 14.2 13.3 12.5*  HCT 43.4 40.5 36.6*  PLT 240 245 198   BMET Recent Labs    07/25/19 0246 07/25/19 0246 07/26/19 0226 07/27/19 0404 07/27/19 0721  NA 142  --  141 133*  --   K 4.5   < > 4.0 2.6* 2.8*  CL 104  --  103 103  --   CO2 28  --  28 20*  --   GLUCOSE 128*  --  135* 114*  --   BUN 20  --  11 7*  --   CREATININE 1.10  --  1.05 0.83  --   CALCIUM 9.1  --  8.9 8.2*  --    < > = values in this interval not displayed.   LFT Recent Labs    07/25/19 0246 07/26/19 0226 07/27/19 0404  PROT 6.0* 6.0* 5.3*  ALBUMIN 2.8* 2.7* 2.2*  AST 23 24 50*  ALT 32 43 73*  ALKPHOS 72 112 95  BILITOT 1.1 1.2 0.9    Studies/Results: CT ABDOMEN PELVIS W CONTRAST  IMPRESSION: Interval improvement in caliber of small bowel and decompression of the colon in keeping with near resolution of previously noted large bowel  obstruction, likely related to nasogastric decompression. Interval development of a infectious or inflammatory colitis involving the entire colon proximal to the point of sigmoid obstruction. The obstructing mass is not well delineated on this examination. Tiny indeterminate lesion within the right hepatic dome likely represents a tiny cyst, the can be reassessed on subsequent examination. Aortic Atherosclerosis (ICD10-I70.0). Electronically Signed   By: Fidela Salisbury MD   On: 07/26/2019 21:35       ASSESMENT:   *  Bowel obstruction and sigmoid mass.   7/13 Colonoscopy: congested mucosa at prox sigmoid, biopsied.  Sigmoid, descending tics O/w normal study.  No mass confirmed.  Path: benign mucosa w lamina propria edema, no inflammation, no microscopic collitis.   07/26/19 CTAP #2: SB and colonic obstructive pattern improved.  Interval pan colitis proximal to area of sigmoid obstruction.  Tiny,  indeterminate R liver lesion  *   Hyponatremia.   Hypokalemia.     *    Mild, new elevation of  Transaminases 50/73, normal bili and alk phos. Indeterminate small liver lesion per CT.   *   Mild anemia.      PLAN   *   Per Dr Carlean Purl ? Advance to [place] or soft diet??     Roy [name]  07/27/2019, 12:29 PM Phone [phone]    [place] Attending   I have taken an interval history, reviewed the chart and examined the patient. I agree with the Advanced Practitioner's note, impression and recommendations.   Path from colon is edema only - no colitis Mild transaminase  changes not likely from liver  This is a mystery  Several weeks + diarrhea and altered bowel habits Mainly diarrhea, some vomiting  I think the colon edema we see now on colonoscopy and Ct could be from hypoalbuminemia - my biopsies do not show any colitis  Will try symptomatic tx and f/u labs  He got sick in June - suspected gastroenteritis w/ neg GI pathogen panel  Never got better - worse and then the CT w/ ? Mass  which is now gone.   He does take lisinopril which may cause gut angioedema but pattern of problems atypical for that in my experience though can get bowel wall edema and diarrhea.  Gatha Mayer, MD, [facility] 07/27/2019 4:26 PM

## 2019-07-27 NOTE — Progress Notes (Signed)
Stat K ordered due to critical low of 2.6 this morning after remaining steady at 4.0 or greater this admission with no significant change.

## 2019-07-27 NOTE — Progress Notes (Signed)
CRITICAL VALUE ALERT  Critical Value:  Potassium 2.6  Date & Time Notied:  07/27/19, 0550  Provider Notified:  Sharlet Salina  Orders Received/Actions taken: new orders received.

## 2019-07-27 NOTE — Progress Notes (Signed)
Subjective:  Denies SSCP, palpitations or Dyspnea Etiology of obstruction not clear but not cancer !! Having trouble controlling BM's   Objective:  Vitals:   07/26/19 1343 07/26/19 2127 07/27/19 0006 07/27/19 0518  BP: 124/86 139/89 119/82 107/80  Pulse: 73 89 (!) 102 88  Resp: 16 18 18 18   Temp: (!) 97.5 F (36.4 C) 98.9 F (37.2 C) 99.5 F (37.5 C) 98.6 F (37 C)  TempSrc: Oral Oral Oral Oral  SpO2: 97% 98% 96% 95%  Weight:      Height:        Intake/Output from previous day:  Intake/Output Summary (Last 24 hours) at 07/27/2019 9702 Last data filed at 07/27/2019 0600 Gross per 24 hour  Intake 1388.1 ml  Output --  Net 1388.1 ml    Physical Exam: Affect appropriate Healthy:  appears stated age HEENT: NG tube in place  Neck supple with no adenopathy JVP normal no bruits no thyromegaly Lungs clear with no wheezing and good diaphragmatic motion Heart:  S1/S2 no murmur, no rub, gallop or click PMI normal Abdomen: mildly typanitic  Distal pulses intact with no bruits Mild edema with stasis changes  Neuro non-focal Skin warm and dry No muscular weakness   Lab Results: Basic Metabolic Panel: Recent Labs    07/25/19 0246 07/25/19 0246 07/26/19 0226 07/26/19 0226 07/27/19 0404 07/27/19 0721  NA 142   < > 141  --  133*  --   K 4.5   < > 4.0   < > 2.6* 2.8*  CL 104   < > 103  --  103  --   CO2 28   < > 28  --  20*  --   GLUCOSE 128*   < > 135*  --  114*  --   BUN 20   < > 11  --  7*  --   CREATININE 1.10   < > 1.05  --  0.83  --   CALCIUM 9.1   < > 8.9  --  8.2*  --   MG 2.1   < > 1.9  --  1.7  --   PHOS 2.7  --  2.5  --   --   --    < > = values in this interval not displayed.   Liver Function Tests: Recent Labs    07/26/19 0226 07/27/19 0404  AST 24 50*  ALT 43 73*  ALKPHOS 112 95  BILITOT 1.2 0.9  PROT 6.0* 5.3*  ALBUMIN 2.7* 2.2*   No results for input(s): LIPASE, AMYLASE in the last 72 hours. CBC: Recent Labs    07/26/19 0226  07/27/19 0404  WBC 9.4 7.6  NEUTROABS 7.0 5.7  HGB 13.3 12.5*  HCT 40.5 36.6*  MCV 89.2 86.5  PLT 245 198   Cardiac Enzymes: No results for input(s): CKTOTAL, CKMB, CKMBINDEX, TROPONINI in the last 72 hours. BNP: Invalid input(s): POCBNP D-Dimer: No results for input(s): DDIMER in the last 72 hours. Hemoglobin A1C: No results for input(s): HGBA1C in the last 72 hours. Fasting Lipid Panel: Recent Labs    07/26/19 0226  TRIG 120    Imaging: CT ABDOMEN PELVIS W CONTRAST  Result Date: 07/26/2019 CLINICAL DATA:  Sigmoid mass, bowel obstruction EXAM: CT ABDOMEN AND PELVIS WITH CONTRAST TECHNIQUE: Multidetector CT imaging of the abdomen and pelvis was performed using the standard protocol following bolus administration of intravenous contrast. CONTRAST:  167mL OMNIPAQUE IOHEXOL 300 MG/ML  SOLN COMPARISON:  07/23/2019 FINDINGS:  Lower chest: Right basilar atelectasis. 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 Nasogastric tube is seen extending into the mid to distal body of the stomach. Right coronary artery calcification and probable coronary artery stenting has been performed. Global cardiac size within normal limits. Hepatobiliary: Tiny hypodensity within the right hepatic dome, axial image # 8/3, is again seen and likely represents a tiny a Paddock cyst, though is too small to definitively characterize. The liver is otherwise unremarkable. The gallbladder is unremarkable. Pancreas: Unremarkable Spleen: Unremarkable Adrenals/Urinary Tract: The adrenal glands are unremarkable. The kidneys are unremarkable. The bladder is largely decompressed and is unremarkable. Stomach/Bowel: The previously noted sigmoid mass is not well appreciated on this examination. There is, however, interval development of extensive circumferential bowel wall thickening and mucosal fold thickening involving the entire colon proximal to the point of obstruction  within the sigmoid colon in keeping with an infectious or inflammatory colitis. There is normal enhancement of the colon. No pneumatosis or free intraperitoneal gas. The colon is largely decompressed. Similarly, there has been interval improvement in the caliber of the small bowel when compared to prior examination in keeping with improvement in large bowel obstruction, likely related to nasogastric decompression. No free intraperitoneal fluid. Appendix normal. Vascular/Lymphatic: There is no pathologic adenopathy within the abdomen and pelvis. The abdominal vasculature is age-appropriate with moderate aortoiliac atherosclerotic calcification. No aneurysm. Reproductive: Unremarkable Other: The rectum is unremarkable. Musculoskeletal: No lytic or blastic bone lesions are seen. Osseous structures are age-appropriate. IMPRESSION: Interval improvement in caliber of small bowel and decompression of the colon in keeping with near resolution of previously noted large bowel obstruction, likely related to nasogastric decompression. Interval development of a infectious or inflammatory colitis involving the entire colon proximal to the point of sigmoid obstruction. The obstructing mass is not well delineated on this examination. Tiny indeterminate lesion within the right hepatic dome likely represents a tiny cyst, the can be reassessed on subsequent examination. Aortic Atherosclerosis (ICD10-I70.0). Electronically Signed   By: Fidela Salisbury MD   On: 07/26/2019 21:35   DG Abd Portable 1V  Result Date: 07/26/2019 CLINICAL DATA:  Bowel obstruction EXAM: PORTABLE ABDOMEN - 1 VIEW COMPARISON:  July 25, 2019 abdominal radiograph and CT abdomen and pelvis July 23, 2019 FINDINGS: Nasogastric tube tip and side port in stomach. Multiple loops of dilated bowel remain with bowel dilatation of up to 4.1 cm. No significant colonic dilatation seen currently. No appreciable free air. No abnormal calcifications. IMPRESSION: Nasogastric  tube tip and side port in stomach. There is persistent predominantly small bowel dilatation, felt to represent a degree of bowel obstruction. No free air appreciable on supine examination. Electronically Signed   By: Lowella Grip III M.D.   On: 07/26/2019 08:07   ECHOCARDIOGRAM COMPLETE  Result Date: 07/25/2019    ECHOCARDIOGRAM REPORT   Patient Name:   Roy [name] Date of Exam: 07/25/2019 Medical Rec #:  [id number]          Height:       66.0 in Accession #:    [id number]         Weight:       167.8 lb Date of Birth:  December 21, 1945          BSA:          1.856 m Patient Age:    61 years           BP:  122/81 mmHg Patient Gender: M                  HR:           78 bpm. Exam Location:  Inpatient Procedure: 2D Echo, Color Doppler and Cardiac Doppler Indications:    Pre-Op Evaluation Z01.810  History:        Patient has prior history of Echocardiogram examinations, most                 recent 04/19/2015. Risk Factors:Hypertension, Diabetes,                 Dyslipidemia and Sleep Apnea.  Sonographer:    Raquel Sarna Senior RDCS Referring Phys: [id number] [place]  1. Left ventricular ejection fraction, by estimation, is 70 to 75%. The left ventricle has hyperdynamic function. The left ventricle has no regional wall motion abnormalities. Left ventricular diastolic parameters are consistent with Grade I diastolic dysfunction (impaired relaxation).  2. Right ventricular systolic function is normal. The right ventricular size is normal. There is normal pulmonary artery systolic pressure.  3. The mitral valve is normal in structure. Trivial mitral valve regurgitation. No evidence of mitral stenosis.  4. The aortic valve is tricuspid. Aortic valve regurgitation is not visualized. No aortic stenosis is present.  5. The inferior vena cava is normal in size with greater than 50% respiratory variability, suggesting right atrial pressure of 3 mmHg. FINDINGS  Left Ventricle: Left ventricular  ejection fraction, by estimation, is 70 to 75%. The left ventricle has hyperdynamic function. The left ventricle has no regional wall motion abnormalities. The left ventricular internal cavity size was normal in size. There is no left ventricular hypertrophy. Left ventricular diastolic parameters are consistent with Grade I diastolic dysfunction (impaired relaxation). Right Ventricle: The right ventricular size is normal.Right ventricular systolic function is normal. There is normal pulmonary artery systolic pressure. The tricuspid regurgitant velocity is 2.35 m/s, and with an assumed right atrial pressure of 3 mmHg, the estimated right ventricular systolic pressure is 32.6 mmHg. Left Atrium: Left atrial size was normal in size. Right Atrium: Right atrial size was normal in size. Pericardium: There is no evidence of pericardial effusion. Mitral Valve: The mitral valve is normal in structure. Normal mobility of the mitral valve leaflets. Trivial mitral valve regurgitation. No evidence of mitral valve stenosis. Tricuspid Valve: The tricuspid valve is normal in structure. Tricuspid valve regurgitation is trivial. No evidence of tricuspid stenosis. Aortic Valve: The aortic valve is tricuspid. Aortic valve regurgitation is not visualized. No aortic stenosis is present. Pulmonic Valve: The pulmonic valve was not well visualized. Pulmonic valve regurgitation is not visualized. No evidence of pulmonic stenosis. Aorta: The aortic root is normal in size and structure. Venous: The inferior vena cava is normal in size with greater than 50% respiratory variability, suggesting right atrial pressure of 3 mmHg. IAS/Shunts: No atrial level shunt detected by color flow Doppler.  LEFT VENTRICLE PLAX 2D LVIDd:         3.14 cm  Diastology LVIDs:         1.99 cm  LV e' lateral:   8.81 cm/s LV PW:         1.10 cm  LV E/e' lateral: 6.6 LV IVS:        1.15 cm  LV e' medial:    6.64 cm/s LVOT diam:     2.10 cm  LV E/e' medial:  8.8 LV SV:  60 LV SV Index:   32 LVOT Area:     3.46 cm  RIGHT VENTRICLE RV S prime:     14.90 cm/s TAPSE (M-mode): 2.2 cm LEFT ATRIUM             Index       RIGHT ATRIUM           Index LA diam:        3.70 cm 1.99 cm/m  RA Area:     16.10 cm LA Vol (A2C):   43.8 ml 23.60 ml/m RA Volume:   36.50 ml  19.67 ml/m LA Vol (A4C):   50.2 ml 27.05 ml/m LA Biplane Vol: 48.3 ml 26.02 ml/m  AORTIC VALVE LVOT Vmax:   86.50 cm/s LVOT Vmean:  56.500 cm/s LVOT VTI:    0.174 m  AORTA Ao Root diam: 3.50 cm MITRAL VALVE               TRICUSPID VALVE MV Area (PHT): 3.27 cm    TR Peak grad:   22.1 mmHg MV Decel Time: 232 msec    TR Vmax:        235.00 cm/s MV E velocity: 58.20 cm/s MV A velocity: 85.40 cm/s  SHUNTS MV E/A ratio:  0.68        Systemic VTI:  0.17 m                            Systemic Diam: 2.10 cm Kirk Ruths MD Electronically signed by Kirk Ruths MD Signature Date/Time: 07/25/2019/3:40:46 PM    Final    [place] EKG SITE RITE  Result Date: 07/25/2019 If Site Rite image not attached, placement could not be confirmed due to current cardiac rhythm.   Cardiac Studies:  ECG: NSR no acute ST changes    Telemetry:  NSR   Echo: EF 70-75%   Medications:    enoxaparin (LOVENOX) injection  40 mg Subcutaneous Q24H   insulin aspart  0-6 Units Subcutaneous Q6H   pantoprazole (PROTONIX) IV  40 mg Intravenous Q12H   sodium chloride flush  3 mL Intravenous Once   sodium chloride flush  3 mL Intravenous Q12H      methocarbamol (ROBAXIN) IV     potassium chloride 10 mEq (07/27/19 0905)    Assessment/Plan:  JETTIE LAZARE is a 74 y.o. 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 And distal colonic lesion at the request of Dr. Loleta Books .  1. Preoperative:  Very active walking 3  miles/day running cars for Flow [place] no angina last stent to D1 2016 with non ischemic nuclear study 2018.  Plavix held continue beta blocker moderate risk for surgery may proceed Resume plavix if no plans for surgery   2. GI:  Colonoscopy 07/26/19 no cancer no stent needed ? Etiology of inflammation Still with NG tube taking soft solids   3. HTN;  Well controlled.  Continue current medications and low sodium Dash type diet.    4. HLD:  On low dose statin    Jenkins Rouge 07/27/2019, 9:22 AM

## 2019-07-28 ENCOUNTER — Other Ambulatory Visit: Payer: Self-pay | Admitting: Internal Medicine

## 2019-07-28 ENCOUNTER — Encounter (HOSPITAL_COMMUNITY): Payer: Self-pay | Admitting: Internal Medicine

## 2019-07-28 DIAGNOSIS — R748 Abnormal levels of other serum enzymes: Secondary | ICD-10-CM

## 2019-07-28 DIAGNOSIS — E876 Hypokalemia: Secondary | ICD-10-CM

## 2019-07-28 LAB — CBC WITH DIFFERENTIAL/PLATELET
Abs Immature Granulocytes: 0.06 10*3/uL (ref 0.00–0.07)
Basophils Absolute: 0 10*3/uL (ref 0.0–0.1)
Basophils Relative: 0 %
Eosinophils Absolute: 0.2 10*3/uL (ref 0.0–0.5)
Eosinophils Relative: 3 %
HCT: 39 % (ref 39.0–52.0)
Hemoglobin: 13.2 g/dL (ref 13.0–17.0)
Immature Granulocytes: 1 %
Lymphocytes Relative: 18 %
Lymphs Abs: 1.2 10*3/uL (ref 0.7–4.0)
MCH: 29.9 pg (ref 26.0–34.0)
MCHC: 33.8 g/dL (ref 30.0–36.0)
MCV: 88.2 fL (ref 80.0–100.0)
Monocytes Absolute: 0.9 10*3/uL (ref 0.1–1.0)
Monocytes Relative: 14 %
Neutro Abs: 4.4 10*3/uL (ref 1.7–7.7)
Neutrophils Relative %: 64 %
Platelets: 225 10*3/uL (ref 150–400)
RBC: 4.42 MIL/uL (ref 4.22–5.81)
RDW: 12.8 % (ref 11.5–15.5)
WBC: 6.7 10*3/uL (ref 4.0–10.5)
nRBC: 0 % (ref 0.0–0.2)

## 2019-07-28 LAB — COMPREHENSIVE METABOLIC PANEL
ALT: 192 U/L — ABNORMAL HIGH (ref 0–44)
AST: 137 U/L — ABNORMAL HIGH (ref 15–41)
Albumin: 2.2 g/dL — ABNORMAL LOW (ref 3.5–5.0)
Alkaline Phosphatase: 107 U/L (ref 38–126)
Anion gap: 10 (ref 5–15)
BUN: 5 mg/dL — ABNORMAL LOW (ref 8–23)
CO2: 19 mmol/L — ABNORMAL LOW (ref 22–32)
Calcium: 8.3 mg/dL — ABNORMAL LOW (ref 8.9–10.3)
Chloride: 107 mmol/L (ref 98–111)
Creatinine, Ser: 0.85 mg/dL (ref 0.61–1.24)
GFR calc Af Amer: >60 mL/min (ref >60)
GFR calc non Af Amer: >60 mL/min (ref >60)
Glucose, Bld: 99 mg/dL (ref 70–99)
Potassium: 3.1 mmol/L — ABNORMAL LOW (ref 3.5–5.1)
Sodium: 136 mmol/L (ref 135–145)
Total Bilirubin: 0.9 mg/dL (ref 0.3–1.2)
Total Protein: 5.3 g/dL — ABNORMAL LOW (ref 6.5–8.1)

## 2019-07-28 LAB — HEPATITIS PANEL, ACUTE
HCV Ab: NONREACTIVE
Hep A IgM: NONREACTIVE
Hep B C IgM: NONREACTIVE
Hepatitis B Surface Ag: NONREACTIVE

## 2019-07-28 LAB — GLUCOSE, CAPILLARY: Glucose-Capillary: 95 mg/dL (ref 70–99)

## 2019-07-28 LAB — MAGNESIUM: Magnesium: 1.5 mg/dL — ABNORMAL LOW (ref 1.7–2.4)

## 2019-07-28 MED ORDER — DICYCLOMINE HCL 20 MG PO TABS: 20.0000 mg | ORAL_TABLET | Freq: Four times a day (QID) | ORAL | 0 refills | Status: DC | PRN

## 2019-07-28 MED ORDER — LOPERAMIDE HCL 2 MG PO CAPS: 4.0000 mg | ORAL_CAPSULE | Freq: Three times a day (TID) | ORAL | 0 refills | Status: DC | PRN

## 2019-07-28 MED ORDER — OMEPRAZOLE 20 MG PO CPDR: 20.0000 mg | DELAYED_RELEASE_CAPSULE | Freq: Every day | ORAL | Status: DC

## 2019-07-28 MED ORDER — NITROGLYCERIN 0.4 MG SL SUBL: 0.4000 mg | SUBLINGUAL_TABLET | SUBLINGUAL | 0 refills | Status: DC | PRN

## 2019-07-28 MED ORDER — PANTOPRAZOLE SODIUM 40 MG PO TBEC: 40.0000 mg | DELAYED_RELEASE_TABLET | Freq: Two times a day (BID) | ORAL | Status: DC

## 2019-07-28 MED ORDER — MAGNESIUM SULFATE 2 GM/50ML IV SOLN
2.0000 g | Freq: Once | INTRAVENOUS | Status: AC
  Administered 2019-07-28: 2 g via INTRAVENOUS
  Filled 2019-07-28: qty 50

## 2019-07-28 MED ORDER — POTASSIUM CHLORIDE CRYS ER 20 MEQ PO TBCR
40.0000 meq | EXTENDED_RELEASE_TABLET | ORAL | Status: AC
  Administered 2019-07-28 (×2): 40 meq via ORAL
  Filled 2019-07-28 (×2): qty 2

## 2019-07-28 MED ORDER — PANTOPRAZOLE SODIUM 40 MG PO TBEC
40.0000 mg | DELAYED_RELEASE_TABLET | Freq: Every day | ORAL | Status: DC
  Administered 2019-07-28: 40 mg via ORAL
  Filled 2019-07-28: qty 1

## 2019-07-28 NOTE — Progress Notes (Signed)
Subjective:  Denies SSCP, palpitations or Dyspnea Etiology of obstruction not clear but not cancer !! Controlling BM better   Objective:  Vitals:   07/27/19 0518 07/27/19 1430 07/27/19 2127 07/28/19 0612  BP: 107/80 118/86 129/86 109/80  Pulse: 88 85 87 80  Resp: 18 17 15 18   Temp: 98.6 F (37 C) 98.6 F (37 C) 98.9 F (37.2 C) 98.4 F (36.9 C)  TempSrc: Oral Oral Oral Oral  SpO2: 95%  99% 95%  Weight:      Height:        Intake/Output from previous day:  Intake/Output Summary (Last 24 hours) at 07/28/2019 0952 Last data filed at 07/28/2019 0800 Gross per 24 hour  Intake 84.02 ml  Output 300 ml  Net -215.98 ml    Physical Exam: Affect appropriate Healthy:  appears stated age HEENT: NG tube removed  Neck supple with no adenopathy JVP normal no bruits no thyromegaly Lungs clear with no wheezing and good diaphragmatic motion Heart:  S1/S2 no murmur, no rub, gallop or click PMI normal Abdomen: mildly typanitic  Distal pulses intact with no bruits Mild edema with stasis changes  Neuro non-focal Skin warm and dry No muscular weakness   Lab Results: Basic Metabolic Panel: Recent Labs    07/26/19 0226 07/26/19 0226 07/27/19 0404 07/27/19 0404 07/27/19 0721 07/28/19 0403  NA 141   < > 133*  --   --  136  K 4.0   < > 2.6*   < > 2.8* 3.1*  CL 103   < > 103  --   --  107  CO2 28   < > 20*  --   --  19*  GLUCOSE 135*   < > 114*  --   --  99  BUN 11   < > 7*  --   --  5*  CREATININE 1.05   < > 0.83  --   --  0.85  CALCIUM 8.9   < > 8.2*  --   --  8.3*  MG 1.9   < > 1.7  --   --  1.5*  PHOS 2.5  --   --   --   --   --    < > = values in this interval not displayed.   Liver Function Tests: Recent Labs    07/27/19 0404 07/28/19 0403  AST 50* 137*  ALT 73* 192*  ALKPHOS 95 107  BILITOT 0.9 0.9  PROT 5.3* 5.3*  ALBUMIN 2.2* 2.2*   No results for input(s): LIPASE, AMYLASE in the last 72 hours. CBC: Recent Labs    07/27/19 0404 07/28/19 0403    WBC 7.6 6.7  NEUTROABS 5.7 4.4  HGB 12.5* 13.2  HCT 36.6* 39.0  MCV 86.5 88.2  PLT 198 225   Fasting Lipid Panel: Recent Labs    07/26/19 0226  TRIG 120    Imaging: CT ABDOMEN PELVIS W CONTRAST  Result Date: 07/26/2019 CLINICAL DATA:  Sigmoid mass, bowel obstruction EXAM: CT ABDOMEN AND PELVIS WITH CONTRAST TECHNIQUE: Multidetector CT imaging of the abdomen and pelvis was performed using the standard protocol following bolus administration of intravenous contrast. CONTRAST:  124mL OMNIPAQUE IOHEXOL 300 MG/ML  SOLN COMPARISON:  07/23/2019 FINDINGS: Lower chest: Right basilar atelectasis. 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 Nasogastric tube is seen extending into the mid to distal body of the stomach. Right coronary artery calcification and probable coronary artery stenting  has been performed. Global cardiac size within normal limits. Hepatobiliary: Tiny hypodensity within the right hepatic dome, axial image # 8/3, is again seen and likely represents a tiny a Paddock cyst, though is too small to definitively characterize. The liver is otherwise unremarkable. The gallbladder is unremarkable. Pancreas: Unremarkable Spleen: Unremarkable Adrenals/Urinary Tract: The adrenal glands are unremarkable. The kidneys are unremarkable. The bladder is largely decompressed and is unremarkable. Stomach/Bowel: The previously noted sigmoid mass is not well appreciated on this examination. There is, however, interval development of extensive circumferential bowel wall thickening and mucosal fold thickening involving the entire colon proximal to the point of obstruction within the sigmoid colon in keeping with an infectious or inflammatory colitis. There is normal enhancement of the colon. No pneumatosis or free intraperitoneal gas. The colon is largely decompressed. Similarly, there has been interval improvement in the caliber of the small bowel when  compared to prior examination in keeping with improvement in large bowel obstruction, likely related to nasogastric decompression. No free intraperitoneal fluid. Appendix normal. Vascular/Lymphatic: There is no pathologic adenopathy within the abdomen and pelvis. The abdominal vasculature is age-appropriate with moderate aortoiliac atherosclerotic calcification. No aneurysm. Reproductive: Unremarkable Other: The rectum is unremarkable. Musculoskeletal: No lytic or blastic bone lesions are seen. Osseous structures are age-appropriate. IMPRESSION: Interval improvement in caliber of small bowel and decompression of the colon in keeping with near resolution of previously noted large bowel obstruction, likely related to nasogastric decompression. Interval development of a infectious or inflammatory colitis involving the entire colon proximal to the point of sigmoid obstruction. The obstructing mass is not well delineated on this examination. Tiny indeterminate lesion within the right hepatic dome likely represents a tiny cyst, the can be reassessed on subsequent examination. Aortic Atherosclerosis (ICD10-I70.0). Electronically Signed   By: Fidela Salisbury MD   On: 07/26/2019 21:35    Cardiac Studies:  ECG: NSR no acute ST changes    Telemetry:  NSR   Echo: EF 70-75%   Medications:    enoxaparin (LOVENOX) injection  40 mg Subcutaneous Q24H   loperamide  4 mg Oral TID   pantoprazole  40 mg Oral Daily   potassium chloride  40 mEq Oral Q4H   sodium chloride flush  3 mL Intravenous Once   sodium chloride flush  3 mL Intravenous Q12H      magnesium sulfate bolus IVPB     methocarbamol (ROBAXIN) IV      Assessment/Plan:  Roy [name] is a 74 y.o. male with a hx of stent to RCA 2001, STEMI 2016 with DES to diag.and residual PLB disease, ranexa not much benefit, last myovue 2018 non ischemic EF 58%, DM, HTN, HLD  who is being seen today for the evaluation of pre-op eval for colonoscopy for  Small bowel obst. And distal colonic lesion at the request of Dr. Loleta Books .  1. Preoperative:  No surgery planned at this time   2. GI:  Colonoscopy 07/26/19 no cancer no stent needed ? Etiology of inflammation LFTls rising Outpatient f/u with Dr Carlean Purl   3. HTN;  Well controlled.  Continue current medications and low sodium Dash type diet.    4. HLD:   Hold statin while LFTls abnormal    Jenkins Rouge 07/28/2019, 9:52 AM

## 2019-07-28 NOTE — Anesthesia Postprocedure Evaluation (Signed)
Anesthesia Post Note  Patient: Roy [name]  Procedure(s) Performed: COLONOSCOPY (N/A ) BIOPSY     Patient location during evaluation: Endoscopy Anesthesia Type: MAC Level of consciousness: awake and alert Pain management: pain level controlled Vital Signs Assessment: post-procedure vital signs reviewed and stable Respiratory status: spontaneous breathing, nonlabored ventilation, respiratory function stable and patient connected to nasal cannula oxygen Cardiovascular status: stable and blood pressure returned to baseline Postop Assessment: no apparent nausea or vomiting Anesthetic complications: no   No complications documented.  Last Vitals:  Vitals:   07/28/19 0612 07/28/19 1300  BP: 109/80 (!) 116/93  Pulse: 80   Resp: 18 (P) 18  Temp: 36.9 C 37.1 C  SpO2: 95% 99%    Last Pain:  Vitals:   07/28/19 1300  TempSrc: Oral  PainSc:                  [name]

## 2019-07-28 NOTE — Discharge Summary (Signed)
Physician Discharge Summary  Roy [name] EHM:[id number] DOB: Oct 09, 1945 DOA: 07/23/2019  PCP: Jinny Sanders, MD  Admit date: 07/23/2019 Discharge date: 07/28/2019  Admitted From: Home Disposition: Home  Recommendations for Outpatient Follow-up:  1. Follow up with PCP in 1 week with repeat CBC/CMP 2. Outpatient follow-up with GI and cardiology 3. Follow up in ED if symptoms worsen or new appear   Home Health: No Equipment/Devices: None  Discharge Condition: Stable CODE STATUS: Full Diet recommendation: Heart healthy  Brief/Interim Summary: 74 year old male with history of CAD status post PCI in 2001 and 2016, depression, hypertension and OSA presented with progressively worsening abdominal pain, bloating over a month along with 15 pounds unintentional weight loss over the last couple of months.  In the ED, creatinine was 1.65 from baseline 1.2 and CT of the abdomen and pelvis showed a 5 cm obstructing lesion in the sigmoid colon with small bowel obstruction.  He underwent colonoscopy which did not show any mass or stricture and follow-up CT after colonoscopy showed interval decompression of the small and large bowel.  Subsequently, diet has been advanced.  Patient is tolerating diet.  He is on loperamide for diarrhea.  He feels better and wants to go home.  GI has cleared the patient for discharge with outpatient follow-up.  Discharge Diagnoses:   Small bowel obstruction -Patient was admitted with small bowel obstruction with CT showing obstructing lesion in the sigmoid colon -Status post colonoscopy on 07/26/2019 by GI which did not show any mass/stricture -Follow-up CT after colonoscopy showed interval decompression of the small and large bowel, and also new development of circumferential large bowel wall thickening proximal to the location of the previously radiographically seen obstruction, consistent with inflammatory or infectious colitis -NG tube has been removed. -Diet has  been advanced and patient is tolerating advance diet.  He is having diarrhea and currently is on loperamide which would be continued on discharge.  GI has cleared the patient for discharge.  Outpatient follow-up with GI.  Coronary artery disease Hypertension -Blood pressure stable.  Resume metoprolol and Plavix on discharge.  Will keep ACE inhibitor and statin on hold for now. -Blood pressure is on the lower side so will keep Imdur on hold as well.  Outpatient follow-up with cardiology.  AKI on CKD stage IIIa -Baseline creatinine 1.2, admitted with creatinine 1.6 -Resolved with hydration -Lisinopril will remain on hold for now.  Outpatient follow-up.  Depression -Restart Effexor on discharge  Prediabetes -A1c 6.2.    Outpatient follow-up.  Sleep apnea -Patient does not use CPAP  Severe malnutrition--follow nutrition recommendations  Discharge Instructions  Discharge Instructions    Diet - low sodium heart healthy   Complete by: As directed    Increase activity slowly   Complete by: As directed      Allergies as of 07/28/2019   No Known Allergies     Medication List    STOP taking these medications   ALPRAZolam 0.25 MG tablet Commonly known as: XANAX   atorvastatin 40 MG tablet Commonly known as: LIPITOR   isosorbide mononitrate 60 MG 24 hr tablet Commonly known as: IMDUR   lisinopril 5 MG tablet Commonly known as: ZESTRIL   Magnesium Oxide 200 MG Tabs     TAKE these medications   carvedilol 12.5 MG tablet Commonly known as: COREG TAKE 1 TABLET (12.5 MG TOTAL) BY MOUTH 2 (TWO) TIMES DAILY. What changed:   how much to take  how to take this  when to take this  additional instructions   clopidogrel 75 MG tablet Commonly known as: PLAVIX TAKE 1 TABLET BY MOUTH EVERY DAY What changed: when to take this   dicyclomine 20 MG tablet Commonly known as: BENTYL Take 1 tablet (20 mg total) by mouth every 6 (six) hours as needed (abdominal pain).    GLUCOSAMINE PO Take 2 tablets by mouth daily.   loperamide 2 MG capsule Commonly known as: IMODIUM Take 2 capsules (4 mg total) by mouth 3 (three) times daily as needed for diarrhea or loose stools.   nitroGLYCERIN 0.4 MG SL tablet Commonly known as: NITROSTAT Place 1 tablet (0.4 mg total) under the tongue every 5 (five) minutes x 3 doses as needed for chest pain. What changed: See the new instructions.   omeprazole 20 MG capsule Commonly known as: PRILOSEC Take 1 capsule (20 mg total) by mouth daily before breakfast.   ondansetron 4 MG tablet Commonly known as: Zofran Take 1 tablet (4 mg total) by mouth every 8 (eight) hours as needed for nausea or vomiting.   venlafaxine XR 75 MG 24 hr capsule Commonly known as: EFFEXOR-XR TAKE 1 CAPSULE (75 MG TOTAL) BY MOUTH DAILY WITH BREAKFAST.       Follow-up Information    Gatha Mayer, MD Follow up on 08/01/2019.   Specialty: Gastroenterology Why: Go to basement - lab - have bloodwork drawn [id number] Contact information: [address] [place] [address] [phone]        Jinny Sanders, MD. Schedule an appointment as soon as possible for a visit in 1 week(s).   Specialty: Family Medicine Contact information: [place] [address] [phone]        Josue Hector, MD .   Specialty: Cardiology Contact information: [phone] [address] [place] [address] [phone]              No Known Allergies  Consultations:  GI/general surgery/cardiology   Procedures/Studies: DG Abd 1 View  Result Date: 07/25/2019 CLINICAL DATA:  Bowel obstruction. EXAM: ABDOMEN - 1 VIEW COMPARISON:  The CT abdomen pelvis-07/23/2019 FINDINGS: There is moderate to marked gaseous distention of multiple loops of predominantly small bowel with index loop of small bowel in the left mid hemiabdomen measuring 3.5 cm in diameter. A small amount of air is seen within the ascending and transverse  colon however otherwise series a conspicuous paucity of distal colonic gas. No supine evidence of pneumoperitoneum. No pneumatosis or portal venous gas Enteric tip and side port projected the expected location of in by the stomach Limited visualization of the lower thorax demonstrates mild elevation of the right hemidiaphragm. A punctate granuloma overlies the left lower lobe with associated partially calcified left hilar lymph nodes, the sequela of previous granulomatous infection. Excreted contrast is seen within the urinary bladder. Mild to moderate multilevel lumbar spine DDD is suspected though incompletely evaluated. IMPRESSION: 1. Findings most suggestive of small-bowel obstruction though note, a distal colonic lesion was identified on abdominal CT performed 07/23/2019. 2. Enteric tube tip and side port project over the expected location of the mid body of the stomach. Electronically Signed   By: Sandi Mariscal M.D.   On: 07/25/2019 07:08   CT CHEST W CONTRAST  Result Date: 07/24/2019 CLINICAL DATA:  Obstructing sigmoid colon mass. Evaluate for metastatic disease. EXAM: CT CHEST WITH CONTRAST TECHNIQUE: Multidetector CT imaging of the chest was performed during intravenous contrast administration. CONTRAST:  5mL OMNIPAQUE IOHEXOL 300 MG/ML  SOLN COMPARISON:  CT abdomen/pelvis 07/23/2019 FINDINGS: Cardiovascular: The heart is normal in size. No pericardial effusion. The aorta is normal in caliber. No dissection. Minimal atherosclerotic calcification at the aortic arch. Branch vessels are patent. Advanced three-vessel coronary artery calcifications are noted. Mediastinum/Nodes: No mediastinal or hilar mass or lymphadenopathy. The esophagus is grossly normal. There is an NG tube coursing down the esophagus and into the stomach. Lungs/Pleura: No worrisome pulmonary lesions to suggest metastatic disease no acute pulmonary findings. No pleural effusions. Streaky right basilar atelectasis is noted. Upper Abdomen:  Persistent dilated small bowel and colon. No free air. The gallbladder is mildly distended. No hepatic lesions are identified. Musculoskeletal: No significant bony findings. IMPRESSION: 1. No CT findings for pulmonary metastatic disease. 2. No mediastinal or hilar mass or adenopathy. 3. Advanced three-vessel coronary artery calcifications. 4. Persistent dilated small bowel and colon. 5. Aortic atherosclerosis. Aortic Atherosclerosis (ICD10-I70.0). Electronically Signed   By: Marijo Sanes M.D.   On: 07/24/2019 13:06   CT ABDOMEN PELVIS W CONTRAST  Result Date: 07/26/2019 CLINICAL DATA:  Sigmoid mass, bowel obstruction EXAM: CT ABDOMEN AND PELVIS WITH CONTRAST TECHNIQUE: Multidetector CT imaging of the abdomen and pelvis was performed using the standard protocol following bolus administration of intravenous contrast. CONTRAST:  167mL OMNIPAQUE IOHEXOL 300 MG/ML  SOLN COMPARISON:  07/23/2019 FINDINGS: Lower chest: Right basilar atelectasis. 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 Nasogastric tube is seen extending into the mid to distal body of the stomach. Right coronary artery calcification and probable coronary artery stenting has been performed. Global cardiac size within normal limits. Hepatobiliary: Tiny hypodensity within the right hepatic dome, axial image # 8/3, is again seen and likely represents a tiny a Paddock cyst, though is too small to definitively characterize. The liver is otherwise unremarkable. The gallbladder is unremarkable. Pancreas: Unremarkable Spleen: Unremarkable Adrenals/Urinary Tract: The adrenal glands are unremarkable. The kidneys are unremarkable. The bladder is largely decompressed and is unremarkable. Stomach/Bowel: The previously noted sigmoid mass is not well appreciated on this examination. There is, however, interval development of extensive circumferential bowel wall thickening and mucosal fold thickening involving  the entire colon proximal to the point of obstruction within the sigmoid colon in keeping with an infectious or inflammatory colitis. There is normal enhancement of the colon. No pneumatosis or free intraperitoneal gas. The colon is largely decompressed. Similarly, there has been interval improvement in the caliber of the small bowel when compared to prior examination in keeping with improvement in large bowel obstruction, likely related to nasogastric decompression. No free intraperitoneal fluid. Appendix normal. Vascular/Lymphatic: There is no pathologic adenopathy within the abdomen and pelvis. The abdominal vasculature is age-appropriate with moderate aortoiliac atherosclerotic calcification. No aneurysm. Reproductive: Unremarkable Other: The rectum is unremarkable. Musculoskeletal: No lytic or blastic bone lesions are seen. Osseous structures are age-appropriate. IMPRESSION: Interval improvement in caliber of small bowel and decompression of the colon in keeping with near resolution of previously noted large bowel obstruction, likely related to nasogastric decompression. Interval development of a infectious or inflammatory colitis involving the entire colon proximal to the point of sigmoid obstruction. The obstructing mass is not well delineated on this examination. Tiny indeterminate lesion within the right hepatic dome likely represents a tiny cyst, the can be reassessed on subsequent examination. Aortic Atherosclerosis (ICD10-I70.0). Electronically Signed   By: Fidela Salisbury MD   On: 07/26/2019 21:35   CT ABDOMEN PELVIS W CONTRAST  Result Date: 07/23/2019 CLINICAL DATA:  Abdominal distension and back pain.  Bloody stools. EXAM: CT ABDOMEN AND PELVIS WITH CONTRAST TECHNIQUE: Multidetector CT imaging of the abdomen and pelvis was performed using the standard protocol following bolus administration of intravenous contrast. CONTRAST:  33mL OMNIPAQUE IOHEXOL 300 MG/ML  SOLN COMPARISON:  None. FINDINGS: Lower  chest: Streaky right basilar scarring changes are noted. There is a calcified granuloma noted at the left lung base. No worrisome pulmonary lesions or pleural effusion. The heart is normal in size. No pericardial effusion. Coronary artery calcifications are noted. The distal esophagus is grossly normal. Hepatobiliary: Very small low-attenuation lesion at the hepatic dome is likely a benign cyst. No worrisome hepatic lesions are identified. The gallbladder is unremarkable. No common bile duct dilatation. Pancreas: No mass, inflammation or ductal dilatation. Spleen: Normal size. A few small scattered calcified granulomas are noted. Adrenals/Urinary Tract: The adrenal glands are normal. No renal lesions or hydronephrosis.  Bladder is unremarkable. Stomach/Bowel: The stomach is mildly distended with fluid and air. The small bowel is dilated and demonstrates scattered air-fluid levels. The colon is also dilated with fluid and air down to an obstructing enhancing lesion in the upper sigmoid colon near the sigmoid colon descending colon junction region. This measures approximately 5 cm and is highly suspicious for colonic neoplasm causing low colonic obstruction. Recommend surgical consultation. Vascular/Lymphatic: Advanced atherosclerotic calcifications involving the aorta and iliac arteries. No aneurysm. The branch vessels are patent. The major venous structures are patent. There are small scattered subcentimeter retroperitoneal lymph nodes. No mass or overt adenopathy. No pelvic adenopathy. I do not see any enlarged lymph nodes in the sigmoid mesocolon or in the pelvis to suggest locoregional adenopathy. Reproductive: The prostate gland and seminal vesicles are unremarkable. Other: No free pelvic fluid collections. No inguinal mass or adenopathy. Musculoskeletal: No significant bony findings. IMPRESSION: 1. 5 cm obstructing enhancing lesion in the upper sigmoid colon near the sigmoid colon descending colon junction  region worrisome for colonic neoplasm causing low colonic obstruction. Fairly dilated colon and small bowel above the lesion. Recommend surgical consultation. 2. No findings for locoregional adenopathy or distant metastatic disease. 3. Advanced atherosclerotic calcifications involving the aorta and iliac arteries. 4. Aortic atherosclerosis. Aortic Atherosclerosis (ICD10-I70.0). Electronically Signed   By: Marijo Sanes M.D.   On: 07/23/2019 13:47   DG Chest Port 1 View  Result Date: 07/23/2019 CLINICAL DATA:  Patient arrived by [facility] complaining of lower back pain radiating through to abdomen x 1 month. States that he has had increased gas and dark stools for several weeks. Has just developed nausea and vomiting x 1 day. Alert EXAM: PORTABLE CHEST - 1 VIEW COMPARISON:  02/10/2012 FINDINGS: Linear scarring or subsegmental atelectasis in the right mid lung. Stable calcified granuloma, left lower lung. No evidence of edema. Heart size and mediastinal contours are within normal limits. No effusion.  No pneumothorax. Visualized bones unremarkable. IMPRESSION: New linear scarring or atelectasis, right mid lung. Otherwise negative. Electronically Signed   By: Lucrezia Europe M.D.   On: 07/23/2019 12:21   DG Abd Portable 1V  Result Date: 07/26/2019 CLINICAL DATA:  Bowel obstruction EXAM: PORTABLE ABDOMEN - 1 VIEW COMPARISON:  July 25, 2019 abdominal radiograph and CT abdomen and pelvis July 23, 2019 FINDINGS: Nasogastric tube tip and side port in stomach. Multiple loops of dilated bowel remain with bowel dilatation of up to 4.1 cm. No significant colonic dilatation seen currently. No appreciable free air. No abnormal calcifications. IMPRESSION: Nasogastric tube tip and side port in stomach. There is persistent predominantly small bowel dilatation, felt  to represent a degree of bowel obstruction. No free air appreciable on supine examination. Electronically Signed   By: Lowella Grip III M.D.   On: 07/26/2019 08:07    ECHOCARDIOGRAM COMPLETE  Result Date: 07/25/2019    ECHOCARDIOGRAM REPORT   Patient Name:   Roy [name] Date of Exam: 07/25/2019 Medical Rec #:  [id number]          Height:       66.0 in Accession #:    [id number]         Weight:       167.8 lb Date of Birth:  24-Nov-1945          BSA:          1.856 m Patient Age:    89 years           BP:           122/81 mmHg Patient Gender: M                  HR:           78 bpm. Exam Location:  Inpatient Procedure: 2D Echo, Color Doppler and Cardiac Doppler Indications:    Pre-Op Evaluation Z01.810  History:        Patient has prior history of Echocardiogram examinations, most                 recent 04/19/2015. Risk Factors:Hypertension, Diabetes,                 Dyslipidemia and Sleep Apnea.  Sonographer:    Raquel Sarna Senior RDCS Referring Phys: [id number] [place]  1. Left ventricular ejection fraction, by estimation, is 70 to 75%. The left ventricle has hyperdynamic function. The left ventricle has no regional wall motion abnormalities. Left ventricular diastolic parameters are consistent with Grade I diastolic dysfunction (impaired relaxation).  2. Right ventricular systolic function is normal. The right ventricular size is normal. There is normal pulmonary artery systolic pressure.  3. The mitral valve is normal in structure. Trivial mitral valve regurgitation. No evidence of mitral stenosis.  4. The aortic valve is tricuspid. Aortic valve regurgitation is not visualized. No aortic stenosis is present.  5. The inferior vena cava is normal in size with greater than 50% respiratory variability, suggesting right atrial pressure of 3 mmHg. FINDINGS  Left Ventricle: Left ventricular ejection fraction, by estimation, is 70 to 75%. The left ventricle has hyperdynamic function. The left ventricle has no regional wall motion abnormalities. The left ventricular internal cavity size was normal in size. There is no left ventricular hypertrophy. Left  ventricular diastolic parameters are consistent with Grade I diastolic dysfunction (impaired relaxation). Right Ventricle: The right ventricular size is normal.Right ventricular systolic function is normal. There is normal pulmonary artery systolic pressure. The tricuspid regurgitant velocity is 2.35 m/s, and with an assumed right atrial pressure of 3 mmHg, the estimated right ventricular systolic pressure is 11.9 mmHg. Left Atrium: Left atrial size was normal in size. Right Atrium: Right atrial size was normal in size. Pericardium: There is no evidence of pericardial effusion. Mitral Valve: The mitral valve is normal in structure. Normal mobility of the mitral valve leaflets. Trivial mitral valve regurgitation. No evidence of mitral valve stenosis. Tricuspid Valve: The tricuspid valve is normal in structure. Tricuspid valve regurgitation is trivial. No evidence of tricuspid stenosis. Aortic Valve: The aortic valve is tricuspid. Aortic valve regurgitation is not visualized. No aortic stenosis is present. Pulmonic Valve: The  pulmonic valve was not well visualized. Pulmonic valve regurgitation is not visualized. No evidence of pulmonic stenosis. Aorta: The aortic root is normal in size and structure. Venous: The inferior vena cava is normal in size with greater than 50% respiratory variability, suggesting right atrial pressure of 3 mmHg. IAS/Shunts: No atrial level shunt detected by color flow Doppler.  LEFT VENTRICLE PLAX 2D LVIDd:         3.14 cm  Diastology LVIDs:         1.99 cm  LV e' lateral:   8.81 cm/s LV PW:         1.10 cm  LV E/e' lateral: 6.6 LV IVS:        1.15 cm  LV e' medial:    6.64 cm/s LVOT diam:     2.10 cm  LV E/e' medial:  8.8 LV SV:         60 LV SV Index:   32 LVOT Area:     3.46 cm  RIGHT VENTRICLE RV S prime:     14.90 cm/s TAPSE (M-mode): 2.2 cm LEFT ATRIUM             Index       RIGHT ATRIUM           Index LA diam:        3.70 cm 1.99 cm/m  RA Area:     16.10 cm LA Vol (A2C):   43.8 ml  23.60 ml/m RA Volume:   36.50 ml  19.67 ml/m LA Vol (A4C):   50.2 ml 27.05 ml/m LA Biplane Vol: 48.3 ml 26.02 ml/m  AORTIC VALVE LVOT Vmax:   86.50 cm/s LVOT Vmean:  56.500 cm/s LVOT VTI:    0.174 m  AORTA Ao Root diam: 3.50 cm MITRAL VALVE               TRICUSPID VALVE MV Area (PHT): 3.27 cm    TR Peak grad:   22.1 mmHg MV Decel Time: 232 msec    TR Vmax:        235.00 cm/s MV E velocity: 58.20 cm/s MV A velocity: 85.40 cm/s  SHUNTS MV E/A ratio:  0.68        Systemic VTI:  0.17 m                            Systemic Diam: 2.10 cm Kirk Ruths MD Electronically signed by Kirk Ruths MD Signature Date/Time: 07/25/2019/3:40:46 PM    Final    [place] EKG SITE RITE  Result Date: 07/25/2019 If Site Rite image not attached, placement could not be confirmed due to current cardiac rhythm.  [place] EKG SITE RITE  Result Date: 07/24/2019 If Site Rite image not attached, placement could not be confirmed due to current cardiac rhythm.   Colonoscopy on 07/26/2019 Impression: - Congested mucosa in the proximal sigmoid colon.  Biopsied. NO STRICTURE, NO COLON CANCER SEEN AS  SUSPECTED BY CT SCAN - ? IF HE HAD A HIGH IMPACTION? - Diverticulosis in the sigmoid colon and in the  descending colon. - The examination was otherwise normal on direct  and retroflexion views. Note was planned as a flex sig and convereted to  colonoscopy based upon lack of mass/stricture and  clinical scenario Recommendation: - Return patient to hospital ward for ongoing care. - Clear liquid diet. - REPEAT A CT SCAN ABD AND PELVIS - Await pathology results   Subjective: Patient seen  and examined at  bedside.  He feels better and thinks that he is ready to go home today.  He is tolerating diet.  Diarrhea is improving.  No worsening fever or abdominal pain.  Discharge Exam: Vitals:   07/27/19 2127 07/28/19 0612  BP: 129/86 109/80  Pulse: 87 80  Resp: 15 18  Temp: 98.9 F (37.2 C) 98.4 F (36.9 C)  SpO2: 99% 95%    General: Pt is alert, awake, not in acute distress.  Looks chronically ill.  Elderly male lying in bed. Cardiovascular: rate controlled, S1/S2 + Respiratory: bilateral decreased breath sounds at bases Abdominal: Soft, NT, ND, bowel sounds + Extremities: Trace lower extremity edema; no cyanosis    The results of significant diagnostics from this hospitalization (including imaging, microbiology, ancillary and laboratory) are listed below for reference.     Microbiology: Recent Results (from the past 240 hour(s))  SARS Coronavirus 2 by RT PCR (hospital order, performed in [facility] hospital lab) Nasopharyngeal Nasopharyngeal Swab     Status: None   Collection Time: 07/23/19  3:24 PM   Specimen: Nasopharyngeal Swab  Result Value Ref Range Status   SARS Coronavirus 2 NEGATIVE NEGATIVE Final    Comment: (NOTE) SARS-CoV-2 target nucleic acids are NOT DETECTED.  The SARS-CoV-2 RNA is generally detectable in upper and lower respiratory specimens during the acute phase of infection. The lowest concentration of SARS-CoV-2 viral copies this assay can detect is 250 copies / mL. A negative result does not preclude SARS-CoV-2 infection and should not be used as the sole basis for treatment or other patient management decisions.  A negative result may occur with improper specimen collection / handling, submission of specimen other than nasopharyngeal swab, presence of viral mutation(s) within the areas targeted by this assay, and inadequate number of viral copies (<250 copies / mL). A negative result must be combined with clinical observations, patient history, and  epidemiological information.  Fact Sheet for Patients:   StrictlyIdeas.no  Fact Sheet for Healthcare Providers: BankingDealers.co.za  This test is not yet approved or  cleared by the [place] FDA and has been authorized for detection and/or diagnosis of SARS-CoV-2 by FDA under an Emergency Use Authorization (EUA).  This EUA will remain in effect (meaning this test can be used) for the duration of the COVID-19 declaration under Section 564(b)(1) of the Act, 21 U.S.C. section 360bbb-3(b)(1), unless the authorization is terminated or revoked sooner.  Performed at [facility], [place] [address], [place], [place] [address]      Labs: BNP (last 3 results) Recent Labs    07/25/19 0246 07/26/19 0226  BNP 54.0 37.6   Basic Metabolic Panel: Recent Labs  Lab 07/24/19 0144 07/24/19 0144 07/25/19 0246 07/26/19 0226 07/27/19 0404 07/27/19 0721 07/28/19 0403  NA 139  --  142 141 133*  --  136  K 4.5   < > 4.5 4.0 2.6* 2.8* 3.1*  CL 99  --  104 103 103  --  107  CO2 29  --  28 28 20*  --  19*  GLUCOSE 151*  --  128* 135* 114*  --  99  BUN 27*  --  20 11 7*  --  5*  CREATININE 1.14  --  1.10 1.05 0.83  --  0.85  CALCIUM 9.2  --  9.1 8.9 8.2*  --  8.3*  MG  --   --  2.1 1.9 1.7  --  1.5*  PHOS  --   --  2.7 2.5  --   --   --    < > = values in this interval not displayed.   Liver Function Tests: Recent Labs  Lab 07/23/19 0957 07/25/19 0246 07/26/19 0226 07/27/19 0404 07/28/19 0403  AST 30 23 24  50* 137*  ALT 42 32 43 73* 192*  ALKPHOS 103 72 112 95 107  BILITOT 1.2 1.1 1.2 0.9 0.9  PROT 7.5 6.0* 6.0* 5.3* 5.3*  ALBUMIN 3.4* 2.8* 2.7* 2.2* 2.2*   Recent Labs  Lab 07/23/19 0957  LIPASE 22   No results for input(s): AMMONIA in the last 168 hours. CBC: Recent Labs  Lab 07/23/19 0957 07/23/19 0957 07/24/19 0144 07/25/19 0246 07/26/19 0226 07/27/19 0404 07/28/19 0403  WBC 17.1*  17.7*   < > 9.8 6.8 9.4  7.6 6.7  NEUTROABS 13.5*  --   --  4.4 7.0 5.7 4.4  HGB 16.6  16.4   < > 14.8 14.2 13.3 12.5* 13.2  HCT 48.7  48.2   < > 43.7 43.4 40.5 36.6* 39.0  MCV 87.1  86.2   < > 87.1 89.9 89.2 86.5 88.2  PLT 413*  385   < > 290 240 245 198 225   < > = values in this interval not displayed.   Cardiac Enzymes: No results for input(s): CKTOTAL, CKMB, CKMBINDEX, TROPONINI in the last 168 hours. BNP: Invalid input(s): POCBNP CBG: Recent Labs  Lab 07/26/19 1617 07/27/19 0003 07/27/19 0516 07/27/19 1715 07/28/19 0608  GLUCAP 155* 92 112* 92 95   D-Dimer No results for input(s): DDIMER in the last 72 hours. Hgb A1c No results for input(s): HGBA1C in the last 72 hours. Lipid Profile Recent Labs    07/26/19 0226  TRIG 120   Thyroid function studies No results for input(s): TSH, T4TOTAL, T3FREE, THYROIDAB in the last 72 hours.  Invalid input(s): FREET3 Anemia work up No results for input(s): VITAMINB12, FOLATE, FERRITIN, TIBC, IRON, RETICCTPCT in the last 72 hours. Urinalysis    Component Value Date/Time   COLORURINE LT YELLOW 12/08/2005 0741   LABSPEC 1.025 12/08/2005 0741   PHURINE 6.0 12/08/2005 0741   GLUCOSEU NEGATIVE 12/08/2005 0741   BILIRUBINUR NEGATIVE 12/08/2005 0741   KETONESUR NEGATIVE 12/08/2005 0741   UROBILINOGEN 0.2 mg/dL 12/08/2005 0741   NITRITE Negative 12/08/2005 0741   LEUKOCYTESUR Negative 12/08/2005 0741   Sepsis Labs Invalid input(s): PROCALCITONIN,  WBC,  LACTICIDVEN Microbiology Recent Results (from the past 240 hour(s))  SARS Coronavirus 2 by RT PCR (hospital order, performed in [facility] hospital lab) Nasopharyngeal Nasopharyngeal Swab     Status: None   Collection Time: 07/23/19  3:24 PM   Specimen: Nasopharyngeal Swab  Result Value Ref Range Status   SARS Coronavirus 2 NEGATIVE NEGATIVE Final    Comment: (NOTE) SARS-CoV-2 target nucleic acids are NOT DETECTED.  The SARS-CoV-2 RNA is generally detectable in upper and lower respiratory  specimens during the acute phase of infection. The lowest concentration of SARS-CoV-2 viral copies this assay can detect is 250 copies / mL. A negative result does not preclude SARS-CoV-2 infection and should not be used as the sole basis for treatment or other patient management decisions.  A negative result may occur with improper specimen collection / handling, submission of specimen other than nasopharyngeal swab, presence of viral mutation(s) within the areas targeted by this assay, and inadequate number of viral copies (<250 copies / mL). A negative result must be combined with clinical observations, patient history, and epidemiological information.  Fact Sheet for Patients:   StrictlyIdeas.no  Fact Sheet for Healthcare Providers: BankingDealers.co.za  This test is not yet approved or  cleared by the [place] FDA and has been authorized for detection and/or diagnosis of SARS-CoV-2 by FDA under an Emergency Use Authorization (EUA).  This EUA will remain in effect (meaning this test can be used) for the duration of the COVID-19 declaration under Section 564(b)(1) of the Act, 21 U.S.C. section 360bbb-3(b)(1), unless the authorization is terminated or revoked sooner.  Performed at [facility], [place] [address] [place], [place] [address]      Time coordinating discharge: 35 minutes  SIGNED:   Aline August, MD  [facility] 07/28/2019, 10:37 AM

## 2019-07-28 NOTE — Progress Notes (Addendum)
Daily Rounding Note  07/28/2019, 8:17 AM  LOS: 5 days   SUBJECTIVE:   Chief complaint: bowel obstruction.      Stool still loose but able to control and avoid incontinence Tolerating soft diet.  No ab pain.  OBJECTIVE:         Vital signs in last 24 hours:    Temp:  [98.4 F (36.9 C)-98.9 F (37.2 C)] 98.4 F (36.9 C) (07/15 0612) Pulse Rate:  [80-87] 80 (07/15 0612) Resp:  [15-18] 18 (07/15 0612) BP: (109-129)/(80-86) 109/80 (07/15 0612) SpO2:  [95 %-99 %] 95 % (07/15 0612) Last BM Date: 07/27/19 Filed Weights   07/24/19 1700 07/25/19 1344  Weight: 76.1 kg 73.6 kg   General: comfortable, not ill looking   Heart: RRR Chest: clear  Abdomen: soft, NT, active BS, ND  Extremities: no CCE Neuro/Psych:  Oriented x 3.  No weakness or deficitsl  Intake/Output from previous day: 07/14 0701 - 07/15 0700 In: 84 [IV Piggyback:84] Out: -   Intake/Output this shift: No intake/output data recorded.  Lab Results: Recent Labs    07/26/19 0226 07/27/19 0404 07/28/19 0403  WBC 9.4 7.6 6.7  HGB 13.3 12.5* 13.2  HCT 40.5 36.6* 39.0  PLT 245 198 225   BMET Recent Labs    07/26/19 0226 07/26/19 0226 07/27/19 0404 07/27/19 0721 07/28/19 0403  NA 141  --  133*  --  136  K 4.0   < > 2.6* 2.8* 3.1*  CL 103  --  103  --  107  CO2 28  --  20*  --  19*  GLUCOSE 135*  --  114*  --  99  BUN 11  --  7*  --  5*  CREATININE 1.05  --  0.83  --  0.85  CALCIUM 8.9  --  8.2*  --  8.3*   < > = values in this interval not displayed.   LFT Recent Labs    07/26/19 0226 07/27/19 0404 07/28/19 0403  PROT 6.0* 5.3* 5.3*  ALBUMIN 2.7* 2.2* 2.2*  AST 24 50* 137*  ALT 43 73* 192*  ALKPHOS 112 95 107  BILITOT 1.2 0.9 0.9    Studies/Results: CT ABDOMEN PELVIS W CONTRAST  Result Date: 07/26/2019 CLINICAL DATA:  Sigmoid mass, bowel obstruction EXAM: CT ABDOMEN AND PELVIS WITH CONTRAST TECHNIQUE: Multidetector CT imaging of  the abdomen and pelvis was performed using the standard protocol following bolus administration of intravenous contrast. CONTRAST:  170m OMNIPAQUE IOHEXOL 300 MG/ML  SOLN COMPARISON:  07/23/2019 FINDINGS: Lower chest: Right basilar atelectasis. 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 Nasogastric tube is seen extending into the mid to distal body of the stomach. Right coronary artery calcification and probable coronary artery stenting has been performed. Global cardiac size within normal limits. Hepatobiliary: Tiny hypodensity within the right hepatic dome, axial image # 8/3, is again seen and likely represents a tiny a Paddock cyst, though is too small to definitively characterize. The liver is otherwise unremarkable. The gallbladder is unremarkable. Pancreas: Unremarkable Spleen: Unremarkable Adrenals/Urinary Tract: The adrenal glands are unremarkable. The kidneys are unremarkable. The bladder is largely decompressed and is unremarkable. Stomach/Bowel: The previously noted sigmoid mass is not well appreciated on this examination. There is, however, interval development of extensive circumferential bowel wall thickening and mucosal fold thickening involving the entire colon proximal to the point of obstruction within the sigmoid colon in  keeping with an infectious or inflammatory colitis. There is normal enhancement of the colon. No pneumatosis or free intraperitoneal gas. The colon is largely decompressed. Similarly, there has been interval improvement in the caliber of the small bowel when compared to prior examination in keeping with improvement in large bowel obstruction, likely related to nasogastric decompression. No free intraperitoneal fluid. Appendix normal. Vascular/Lymphatic: There is no pathologic adenopathy within the abdomen and pelvis. The abdominal vasculature is age-appropriate with moderate aortoiliac atherosclerotic calcification. No  aneurysm. Reproductive: Unremarkable Other: The rectum is unremarkable. Musculoskeletal: No lytic or blastic bone lesions are seen. Osseous structures are age-appropriate. IMPRESSION: Interval improvement in caliber of small bowel and decompression of the colon in keeping with near resolution of previously noted large bowel obstruction, likely related to nasogastric decompression. Interval development of a infectious or inflammatory colitis involving the entire colon proximal to the point of sigmoid obstruction. The obstructing mass is not well delineated on this examination. Tiny indeterminate lesion within the right hepatic dome likely represents a tiny cyst, the can be reassessed on subsequent examination. Aortic Atherosclerosis (ICD10-I70.0). Electronically Signed   By: Fidela Salisbury MD   On: 07/26/2019 21:35   Scheduled Meds:  enoxaparin (LOVENOX) injection  40 mg Subcutaneous Q24H   loperamide  4 mg Oral TID   pantoprazole (PROTONIX) IV  40 mg Intravenous Q12H   potassium chloride  40 mEq Oral Q4H   sodium chloride flush  3 mL Intravenous Once   sodium chloride flush  3 mL Intravenous Q12H   Continuous Infusions:  magnesium sulfate bolus IVPB     methocarbamol (ROBAXIN) IV     PRN Meds:.acetaminophen **OR** acetaminophen, albuterol, dicyclomine, labetalol, LORazepam, methocarbamol (ROBAXIN) IV, morphine injection, ondansetron (ZOFRAN) IV   ASSESMENT:   *  Colonic edema.  Bowel obstruction.    7/13 Colonoscopy: congested mucosa at prox sigmoid, biopsied.  Sigmoid, descending tics O/w normal study.  No mass confirmed.  Path: benign mucosa w lamina propria edema, no inflammation, no microscopic collitis.   07/26/19 CTAP #2: SB and colonic obstructive pattern improved.  Interval pan colitis proximal to area of sigmoid obstruction.  Tiny,  indeterminate R liver lesion    *   Hypomagnesemia, Hypokalemia.     *    Rising Transaminases 50/73 >> 137/192, normal bili and alk phos.  Indeterminate small liver lesion per CT.  No current meds that stand out as causes for DILI, no abx since admission.     *   Protein malnutrition, severity not determined.  Pre-albumin 10.5.     PLAN   *  Acute hep serologies, ANA, today.  LFTs in AM.      Azucena Freed  07/28/2019, 8:17 AM Phone [phone]   Rise in transaminases of unclear source and significance at this time. Per TRH MD he feels better and desires dc I think that is reasonable and I will arrange f/u for him after dc - see dc notes but he is to get labs on 7/19 at my office  Gatha Mayer, MD, [facility] 07/28/2019 9:38 AM

## 2019-07-28 NOTE — Plan of Care (Signed)
Problem: Education: Goal: Knowledge of General Education information will improve Description: Including pain rating scale, medication(s)/side effects and non-pharmacologic comfort measures 07/28/2019 1445 by Candida Peeling, RN Outcome: Completed/Met 07/28/2019 1400 by Candida Peeling, RN Outcome: Adequate for Discharge   Problem: Health Behavior/Discharge Planning: Goal: Ability to manage health-related needs will improve 07/28/2019 1445 by Candida Peeling, RN Outcome: Completed/Met 07/28/2019 1400 by Candida Peeling, RN Outcome: Adequate for Discharge   Problem: Clinical Measurements: Goal: Ability to maintain clinical measurements within normal limits will improve 07/28/2019 1445 by Candida Peeling, RN Outcome: Completed/Met 07/28/2019 1400 by Candida Peeling, RN Outcome: Adequate for Discharge Goal: Will remain free from infection 07/28/2019 1445 by Candida Peeling, RN Outcome: Completed/Met 07/28/2019 1400 by Candida Peeling, RN Outcome: Adequate for Discharge Goal: Diagnostic test results will improve 07/28/2019 1445 by Candida Peeling, RN Outcome: Completed/Met 07/28/2019 1400 by Candida Peeling, RN Outcome: Adequate for Discharge Goal: Respiratory complications will improve 07/28/2019 1445 by Candida Peeling, RN Outcome: Completed/Met 07/28/2019 1400 by Candida Peeling, RN Outcome: Adequate for Discharge Goal: Cardiovascular complication will be avoided 07/28/2019 1445 by Candida Peeling, RN Outcome: Completed/Met 07/28/2019 1400 by Candida Peeling, RN Outcome: Adequate for Discharge   Problem: Activity: Goal: Risk for activity intolerance will decrease 07/28/2019 1445 by Candida Peeling, RN Outcome: Completed/Met 07/28/2019 1400 by Candida Peeling, RN Outcome: Adequate for Discharge   Problem: Nutrition: Goal: Adequate nutrition will be maintained 07/28/2019 1445 by Candida Peeling, RN Outcome:  Completed/Met 07/28/2019 1400 by Candida Peeling, RN Outcome: Adequate for Discharge   Problem: Coping: Goal: Level of anxiety will decrease 07/28/2019 1445 by Candida Peeling, RN Outcome: Completed/Met 07/28/2019 1400 by Candida Peeling, RN Outcome: Adequate for Discharge   Problem: Elimination: Goal: Will not experience complications related to bowel motility 07/28/2019 1445 by Candida Peeling, RN Outcome: Completed/Met 07/28/2019 1400 by Candida Peeling, RN Outcome: Adequate for Discharge Goal: Will not experience complications related to urinary retention 07/28/2019 1445 by Candida Peeling, RN Outcome: Completed/Met 07/28/2019 1400 by Candida Peeling, RN Outcome: Adequate for Discharge   Problem: Pain Managment: Goal: General experience of comfort will improve 07/28/2019 1445 by Candida Peeling, RN Outcome: Completed/Met 07/28/2019 1400 by Candida Peeling, RN Outcome: Adequate for Discharge   Problem: Safety: Goal: Ability to remain free from injury will improve 07/28/2019 1445 by Candida Peeling, RN Outcome: Completed/Met 07/28/2019 1400 by Candida Peeling, RN Outcome: Adequate for Discharge   Problem: Skin Integrity: Goal: Risk for impaired skin integrity will decrease 07/28/2019 1445 by Candida Peeling, RN Outcome: Completed/Met 07/28/2019 1400 by Candida Peeling, RN Outcome: Adequate for Discharge

## 2019-07-28 NOTE — Progress Notes (Signed)
Roy [name] to be D/C'd  per MD order. Discussed with the patient and all questions fully answered.  VSS, Skin clean, dry and intact without evidence of skin break down, no evidence of skin tears noted.  IV catheter discontinued intact. Site without signs and symptoms of complications. Dressing and pressure applied.  An After Visit Summary was printed and given to the patient. Patient received prescription.  D/c education completed with patient/family including follow up instructions, medication list, d/c activities limitations if indicated, with other d/c instructions as indicated by MD - patient able to verbalize understanding, all questions fully answered.   Patient instructed to return to ED, call 911, or call MD for any changes in condition.   Patient to be escorted via [place], and D/C home via private auto.

## 2019-07-28 NOTE — Care Management Important Message (Signed)
Important Message  Patient Details  Name: Roy [name] MRN: [id number] Date of Birth: 1945/06/13   Medicare Important Message Given:  Yes     [name] 07/28/2019, 3:29 PM

## 2019-07-28 NOTE — Progress Notes (Signed)
[facility] Progress Note  2 Days Post-Op  Subjective: CC-  Feeling better today. States that he only had a few BMs over night. Diarrhea is less after 1 dose of imodium last night. Denies any abdominal pain, bloating, n/v. Tolerating diet and feeling hungry.  Objective: Vital signs in last 24 hours: Temp:  [98.4 F (36.9 C)-98.9 F (37.2 C)] 98.4 F (36.9 C) (07/15 0612) Pulse Rate:  [80-87] 80 (07/15 0612) Resp:  [15-18] 18 (07/15 0612) BP: (109-129)/(80-86) 109/80 (07/15 0612) SpO2:  [95 %-99 %] 95 % (07/15 0612) Last BM Date: 07/27/19  Intake/Output from previous day: 07/14 0701 - 07/15 0700 In: 84 [IV Piggyback:84] Out: -  Intake/Output this shift: Total I/O In: -  Out: 300 [Urine:300]  PE: Gen: Alert, NAD, pleasant HEENT: EOM's intact, pupils equal and round Card: RRR Pulm: CTAB, no W/R/R, rate and effort normal Abd: Soft,nondistended, +BS, no HSM, no hernia,nontender Psych: A&Ox3 Skin: no rashes noted, warm and dry  Lab Results:  Recent Labs    07/27/19 0404 07/28/19 0403  WBC 7.6 6.7  HGB 12.5* 13.2  HCT 36.6* 39.0  PLT 198 225   BMET Recent Labs    07/27/19 0404 07/27/19 0404 07/27/19 0721 07/28/19 0403  NA 133*  --   --  136  K 2.6*   < > 2.8* 3.1*  CL 103  --   --  107  CO2 20*  --   --  19*  GLUCOSE 114*  --   --  99  BUN 7*  --   --  5*  CREATININE 0.83  --   --  0.85  CALCIUM 8.2*  --   --  8.3*   < > = values in this interval not displayed.   PT/INR No results for input(s): LABPROT, INR in the last 72 hours. CMP     Component Value Date/Time   NA 136 07/28/2019 0403   K 3.1 (L) 07/28/2019 0403   CL 107 07/28/2019 0403   CO2 19 (L) 07/28/2019 0403   GLUCOSE 99 07/28/2019 0403   GLUCOSE 118 (H) 12/08/2005 0741   BUN 5 (L) 07/28/2019 0403   CREATININE 0.85 07/28/2019 0403   CREATININE 1.11 04/02/2015 1514   CALCIUM 8.3 (L) 07/28/2019 0403   PROT 5.3 (L) 07/28/2019 0403   PROT 6.6 03/26/2016 0736   ALBUMIN  2.2 (L) 07/28/2019 0403   ALBUMIN 4.0 03/26/2016 0736   AST 137 (H) 07/28/2019 0403   ALT 192 (H) 07/28/2019 0403   ALKPHOS 107 07/28/2019 0403   BILITOT 0.9 07/28/2019 0403   BILITOT 0.3 03/26/2016 0736   GFRNONAA >60 07/28/2019 0403   GFRAA >60 07/28/2019 0403   Lipase     Component Value Date/Time   LIPASE 22 07/23/2019 0957       Studies/Results: CT ABDOMEN PELVIS W CONTRAST  Result Date: 07/26/2019 CLINICAL DATA:  Sigmoid mass, bowel obstruction EXAM: CT ABDOMEN AND PELVIS WITH CONTRAST TECHNIQUE: Multidetector CT imaging of the abdomen and pelvis was performed using the standard protocol following bolus administration of intravenous contrast. CONTRAST:  197mL OMNIPAQUE IOHEXOL 300 MG/ML  SOLN COMPARISON:  07/23/2019 FINDINGS: Lower chest: Right basilar atelectasis. 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 Nasogastric tube is seen extending into the mid to distal body of the stomach. Right coronary artery calcification and probable coronary artery stenting has been performed. Global cardiac size within normal limits. Hepatobiliary: Tiny hypodensity within the right hepatic dome,  axial image # 8/3, is again seen and likely represents a tiny a Paddock cyst, though is too small to definitively characterize. The liver is otherwise unremarkable. The gallbladder is unremarkable. Pancreas: Unremarkable Spleen: Unremarkable Adrenals/Urinary Tract: The adrenal glands are unremarkable. The kidneys are unremarkable. The bladder is largely decompressed and is unremarkable. Stomach/Bowel: The previously noted sigmoid mass is not well appreciated on this examination. There is, however, interval development of extensive circumferential bowel wall thickening and mucosal fold thickening involving the entire colon proximal to the point of obstruction within the sigmoid colon in keeping with an infectious or inflammatory colitis. There is normal  enhancement of the colon. No pneumatosis or free intraperitoneal gas. The colon is largely decompressed. Similarly, there has been interval improvement in the caliber of the small bowel when compared to prior examination in keeping with improvement in large bowel obstruction, likely related to nasogastric decompression. No free intraperitoneal fluid. Appendix normal. Vascular/Lymphatic: There is no pathologic adenopathy within the abdomen and pelvis. The abdominal vasculature is age-appropriate with moderate aortoiliac atherosclerotic calcification. No aneurysm. Reproductive: Unremarkable Other: The rectum is unremarkable. Musculoskeletal: No lytic or blastic bone lesions are seen. Osseous structures are age-appropriate. IMPRESSION: Interval improvement in caliber of small bowel and decompression of the colon in keeping with near resolution of previously noted large bowel obstruction, likely related to nasogastric decompression. Interval development of a infectious or inflammatory colitis involving the entire colon proximal to the point of sigmoid obstruction. The obstructing mass is not well delineated on this examination. Tiny indeterminate lesion within the right hepatic dome likely represents a tiny cyst, the can be reassessed on subsequent examination. Aortic Atherosclerosis (ICD10-I70.0). Electronically Signed   By: Fidela Salisbury MD   On: 07/26/2019 21:35    Anti-infectives: Anti-infectives (From admission, onward)   None       Assessment/Plan HTN HLD CADs/p PCI 2001 and 2016(on plavix, last dose 7/9)- hold plavix. ECHO7/12 shows EF 70-75%. Cardiology reports patient is moderate cardiovascular risk DM - A1c 6.2 OSA Malnutrition -prealbumin10.5(7/13).Tolerating PO, after flex sig we may be able to start some nutritional supplements by mouth rather than TPN AKI -Cr 0.83, resolved Hypokalemia  ?Partially obstructing sigmoid lesion/mass  >> no mass seen on flex sig 7/13, congested  mucosa in the proximal sigmoid colon that was biopsied -CT abd/pelvis withno findings for locoregional adenopathy or distant metastatic disease - CT chestnegative for metastatic disease -CEA3.8  Colonic edema ?Colitis - repeat CT 7/13 Interval development of a infectious or inflammatory colitis involving the entire colon proximal to the point of sigmoid obstruction - pathology from colonoscopy reports no colitis and only benign bowel edema  ID -none FEN - soft diet VTE -SCDs, lovenox Foley -none Follow up - GI  Plan-Patient with no acute surgical needs. GI planning for further work up of colonic edema.  General surgery will sign off, please call with concerns.   LOS: 5 days    [facility] 07/28/2019, 10:33 AM Please see Amion for pager number during day hours 7:00am-4:30pm

## 2019-07-28 NOTE — Anesthesia Preprocedure Evaluation (Signed)
Anesthesia Evaluation  Patient identified by MRN, date of birth, ID band Patient awake    Reviewed: Allergy & Precautions, NPO status , Patient's Chart, lab work & pertinent test results  Airway Mallampati: III  TM Distance: >3 FB Neck ROM: Full    Dental  (+) Dental Advisory Given   Pulmonary neg shortness of breath, sleep apnea and Continuous Positive Airway Pressure Ventilation , neg COPD, former smoker,    breath sounds clear to auscultation       Cardiovascular hypertension, Pt. on medications (-) angina+ CAD and + Cardiac Stents   Rhythm:Regular  a.  stent to the RCA in 2001;  b.  Negative Myoview in January 2012;  c.  [place] 4/16:  dLM 20, small D1 50-70, D2 sub-total, oLCx 50-70, RCA stent ok, dPLB 90, EF 35-40% >> DES to D2 HTN (    Neuro/Psych PSYCHIATRIC DISORDERS Depression negative neurological ROS     GI/Hepatic GERD  ,  Endo/Other  diabetes  Renal/GU Renal disease     Musculoskeletal   Abdominal   Peds  Hematology   Anesthesia Other Findings   Reproductive/Obstetrics                             Anesthesia Physical Anesthesia Plan  ASA: III  Anesthesia Plan: MAC   Post-op Pain Management:    Induction:   PONV Risk Score and Plan: 1 and Treatment may vary due to age or medical condition  Airway Management Planned: Nasal Cannula  Additional Equipment: None  Intra-op Plan:   Post-operative Plan:   Informed Consent: I have reviewed the patients History and Physical, chart, labs and discussed the procedure including the risks, benefits and alternatives for the proposed anesthesia with the patient or authorized representative who has indicated his/her understanding and acceptance.     Dental advisory given  Plan Discussed with: CRNA and Surgeon  Anesthesia Plan Comments:         Anesthesia Quick Evaluation

## 2019-07-28 NOTE — Plan of Care (Signed)

## 2019-07-29 ENCOUNTER — Ambulatory Visit: Payer: Medicare HMO | Admitting: Nurse Practitioner

## 2019-07-29 ENCOUNTER — Encounter: Payer: Self-pay | Admitting: Nurse Practitioner

## 2019-07-29 ENCOUNTER — Telehealth: Payer: Self-pay

## 2019-07-29 VITALS — BP 82/58 | HR 102 | Ht 66.0 in | Wt 163.5 lb

## 2019-07-29 DIAGNOSIS — K523 Indeterminate colitis: Secondary | ICD-10-CM

## 2019-07-29 DIAGNOSIS — K529 Noninfective gastroenteritis and colitis, unspecified: Secondary | ICD-10-CM | POA: Insufficient documentation

## 2019-07-29 LAB — ANTINUCLEAR ANTIBODIES, IFA: ANA Ab, IFA: NEGATIVE

## 2019-07-29 NOTE — Telephone Encounter (Signed)
-----   Message from Jinny Sanders, MD sent at 07/29/2019  5:01 PM EDT -----   ----- Message ----- From: Noralyn Pick, NP Sent: 07/29/2019   3:51 PM EDT To: Jinny Sanders, MD

## 2019-07-29 NOTE — Progress Notes (Signed)
Call patient... Given BP persistently low.. have him decrease  coreg to 1/2 tabs twice daily.

## 2019-07-29 NOTE — Telephone Encounter (Signed)
Spoke to pt and forwarded the information from Dr. Diona Browner about pt's Coreg. Pt stated understanding.

## 2019-07-29 NOTE — Progress Notes (Signed)
07/29/2019 Roy [name] [id number] [birth year]   CHIEF COMPLAINT:  Hospital follow up, colitis   HISTORY OF PRESENT ILLNESS:  Roy [name] is a 74 year old male with a past medical history of hypertension, hyperlipidemia, coronary artery disease s/p stent to RCA in 2001 and 2nd stent placed in 2012 on Plavix, sleep apnea and basal cell carcinoma to his legs.  Dr. Lyndel Safe and I  initially saw the patient on 07/24/2019 during his admission to [facility] due to having generalized abdominal pain and lower back pain which progressively worsened for the past 2 months.  An abdominal/pelvic CAT scan with contrast 7/10 identified a 5 cm obstructing lesion in the right upper sigmoid/ascending colon junction concerning for a neoplastic process causing low colonic obstruction, fairly dilated colon and small bowel above the lesion. General surgery was consulted with plans for a possible colon resection.  A GI consult was requested for a colonoscopy and possible stent placement to allow the patient to complete a bowel prep prior to having a colon resection. He underwent a colonoscopy by Dr. Carlean Purl 07/26/2019 which identified congested mucosa in the proximal sigmoid colon without evidence of a mass or stricture. Diverticulosis in the sigmoid and descending colon was noted.  His pathology report identified benign mucosa with lamina propria edema, no inflammation and no evidence of microscopic colitis or IBD.  A repeat abdominal/pelvic CT 07/26/2019 showed the prior colonic obstructive pattern improved, interval pancolitis proximal to the area of sigmoid obstruction.  His LFTs increased during his hospitalization. AST 50 -> 137. ALT 73 -> 192.  T. Bili and Alk phos levels were normal. Acute hepatitis panel was negative. He received Imodium x 1 dose and his diarrhea decreased. He was tolerating po liquids and solid foods. No further abdominal pain. He was discharged home yesterday afternoon.   He  presents today for follow up. He previously scheduled today's appointment prior to his hospital admission. He returned home yesterday afternoon around 2pm.  He had 4 episodes of non bloody diarrhea yesterday from 2pm to 9pm. He awakened at 3am to urinate and he passed a small amount of loose stool. He took an Imodium as he wanted to sleep and he did not wish to have any more diarrhea during the night. Currently, no  N/V or abdominal pain. He passed 2 small loose stools later this morning.  He is urinating clear yellow urine several times so far today. He is drinking water, Gatorade and Boost. His BP is 78/58.HR 102 b/min.  He took his Caredilol 12.104m one tab this morning. Repeat BP 82/58. He is hemodynamically stable. No CP, dizziness, palpitations or SOB. Plavix was restarted today. No alcohol use.   CBC Latest Ref Rng & Units 07/28/2019 07/27/2019 07/26/2019  WBC 4.0 - 10.5 K/uL 6.7 7.6 9.4  Hemoglobin 13.0 - 17.0 g/dL 13.2 12.5(L) 13.3  Hematocrit 39 - 52 % 39.0 36.6(L) 40.5  Platelets 150 - 400 K/uL 225 198 245    CMP Latest Ref Rng & Units 07/28/2019 07/27/2019 07/27/2019  Glucose 70 - 99 mg/dL 99 - 114(H)  BUN 8 - 23 mg/dL 5(L) - 7(L)  Creatinine 0.61 - 1.24 mg/dL 0.85 - 0.83  Sodium 135 - 145 mmol/L 136 - 133(L)  Potassium 3.5 - 5.1 mmol/L 3.1(L) 2.8(L) 2.6(LL)  Chloride 98 - 111 mmol/L 107 - 103  CO2 22 - 32 mmol/L 19(L) - 20(L)  Calcium 8.9 - 10.3 mg/dL 8.3(L) - 8.2(L)  Total Protein 6.5 -  8.1 g/dL 5.3(L) - 5.3(L)  Total Bilirubin 0.3 - 1.2 mg/dL 0.9 - 0.9  Alkaline Phos 38 - 126 U/L 107 - 95  AST 15 - 41 U/L 137(H) - 50(H)  ALT 0 - 44 U/L 192(H) - 73(H)   Colonoscopy 07/26/2019: - Congested mucosa in the proximal sigmoid colon. Biopsied. NO STRICTURE, NO COLON CANCER SEEN AS SUSPECTED BY CT SCAN - ? IF HE HAD A HIGH IMPACTION? - Diverticulosis in the sigmoid colon and in the descending colon. - The examination was otherwise normal on direct and retroflexion views. Note was planned as  a flex sig and convereted to colonoscopy based upon lack of mass/stricture and clinical scenario  Abdominal/Pelvic CT with contrast 07/23/2019: 1. 5 cm obstructing enhancing lesion in the upper sigmoid colon near the sigmoid colon descending colon junction region worrisome for colonic neoplasm causing low colonic obstruction. Fairly dilated colon and small bowel above the lesion. Recommend surgical consultation. 2. No findings for locoregional adenopathy or distant metastatic disease. 3. Advanced atherosclerotic calcifications involving the aorta and iliac arteries. 4. Aortic atherosclerosis. Aortic Atherosclerosis   Abdominal/Pelvic CT with contrast 07/26/2019: Interval improvement in caliber of small bowel and decompression of the colon in keeping with near resolution of previously noted large bowel obstruction, likely related to nasogastric decompression.  Interval development of a infectious or inflammatory colitis involving the entire colon proximal to the point of sigmoid obstruction. The obstructing mass is not well delineated on this examination.  Tiny indeterminate lesion within the right hepatic dome likely represents a tiny cyst, the can be reassessed on subsequent examination.  Aortic Atherosclerosis   No Known Allergies    Outpatient Encounter Medications as of 07/29/2019  Medication Sig  . carvedilol (COREG) 12.5 MG tablet TAKE 1 TABLET (12.5 MG TOTAL) BY MOUTH 2 (TWO) TIMES DAILY.  Marland Kitchen clopidogrel (PLAVIX) 75 MG tablet TAKE 1 TABLET BY MOUTH EVERY DAY  . dicyclomine (BENTYL) 20 MG tablet Take 1 tablet (20 mg total) by mouth every 6 (six) hours as needed (abdominal pain).  . Glucosamine HCl (GLUCOSAMINE PO) Take 2 tablets by mouth daily.  Marland Kitchen loperamide (IMODIUM) 2 MG capsule Take 2 capsules (4 mg total) by mouth 3 (three) times daily as needed for diarrhea or loose stools.  . nitroGLYCERIN (NITROSTAT) 0.4 MG SL tablet Place 1 tablet (0.4 mg total) under the  tongue every 5 (five) minutes x 3 doses as needed for chest pain.  Marland Kitchen omeprazole (PRILOSEC) 20 MG capsule Take 1 capsule (20 mg total) by mouth daily before breakfast.  . ondansetron (ZOFRAN) 4 MG tablet Take 1 tablet (4 mg total) by mouth every 8 (eight) hours as needed for nausea or vomiting.  . venlafaxine XR (EFFEXOR-XR) 75 MG 24 hr capsule TAKE 1 CAPSULE (75 MG TOTAL) BY MOUTH DAILY WITH BREAKFAST.   No facility-administered encounter medications on file as of 07/29/2019.     REVIEW OF SYSTEMS: All other systems reviewed and negative except where noted in the History of Present Illness.  PHYSICAL EXAM: BP (!) 78/58 (BP Location: Right Arm, Patient Position: Sitting, Cuff Size: Normal)   Pulse (!) 102   Ht 5' 6" (1.676 m)   Wt 163 lb 8 oz (74.2 kg)   SpO2 99%   BMI 26.39 kg/m  General: Fatigued appearing male in no acute distress. Head: Normocephalic and atraumatic. Eyes:  Sclerae non-icteric, conjunctive pink. Ears: Normal auditory acuity. Neck: Supple. Lungs: Clear bilaterally to auscultation without wheezes, crackles or rhonchi. Heart: Regular rate and rhythm.  No murmur, rub or gallop appreciated.  Abdomen: Soft, nontender, non distended. No masses. No hepatosplenomegaly. Normoactive bowel sounds x 4 quadrants.  Rectal: Deferred.  Musculoskeletal: Symmetrical with no gross deformities. Skin: Warm and dry. No rash or lesions on visible extremities. Extremities: No edema. Neurological: Alert oriented x 4, no focal deficits.  Psychological:  Alert and cooperative. Normal mood and affect.  ASSESSMENT AND PLAN:  93.  74 year old male admitted to the hospital 07/23/2019 with abdominal pain.  An abdominal/pelvic CT scan 7/10 identified a bowel obstruction secondary to a sigmoid mass.  S/P colonoscopy with Dr. Carlean Purl 7/13 showed congested mucosa in the proximal sigmoid colon without evidence of a mass or stricture. Biopsies without evidence of IBD or malignancy.  A repeat  abdominal/pelvic CT scan was done 7/13 which showed interval improvement in the caliber of the small bowel and decompression of the colon with near resolution of previously noted large bowel obstruction with the interval development of infectious or inflammatory colitis involving the entire colon proximal to the point of sigmoid obstruction. -CBC, BMP, hepatic panel to be completed on Monday, 08/01/2019 -GI pathogen panel -Push fluids.  Bland diet as tolerated. -No Imodium recommended at this time. -Repeat abdominal/pelvic CT scan in 4 weeks, earlier if abdominal pain recur -Follow-up in the office in 4 weeks -Further recommendations per Dr. Lyndel Safe -Patient to present to the emergency room if he develops severe abdominal pain, nausea or vomiting   2. Elevated LFTs, unclear etiology (possibly due to hypotension). CTAP 7/10 and 7/13 showed a small right liver cyst.  -Repeat hepatic panel on 7/19  3.  Hypotension, asymptomatic at this time.  BP 78/58.  Repeat BP 82/58.  Heart rate 102. -Patient to contact his primary care physician and cardiologist regarding low blood pressure in office today.  4.  History of coronary artery disease x 2 stents on Plavix   5. Hypokalemia -Patient to push fluids, Gatorade, eat one banana daily for the next few days -Repeat BMP on 7/19       CC:  Jinny Sanders, MD

## 2019-07-29 NOTE — Progress Notes (Signed)
Mr. Deady notified by telephone that Dr. Diona Browner received a office note from his GI and since his BP is continuing to run low, she recommends decreasing his Coreg to 1/2 tab two times a day. Patient states understanding.

## 2019-07-29 NOTE — Patient Instructions (Addendum)
If you are age 73 or older, your body mass index should be between 23-30. Your Body mass index is 26.39 kg/m. If this is out of the aforementioned range listed, please consider follow up with your Primary Care Provider.  If you are age 59 or younger, your body mass index should be between 19-25. Your Body mass index is 26.39 kg/m. If this is out of the aformentioned range listed, please consider follow up with your Primary Care Provider.    Your provider has requested that you go to the basement level for lab work on 08/01/2019. Press "B" on the elevator. The lab is located at the first door on the left as you exit the elevator.  1. No Imodium 2. Stick to a bland diet and push your fluid intake 3. Come back to the lab on Monday 08/01/2019 for bloodwork 4. Contact your PCP or cardiologist regarding your low blood pressure. 5. Follow up in 4 weeks  Due to recent changes in healthcare laws, you may see the results of your imaging and laboratory studies on MyChart before your provider has had a chance to review them.  We understand that in some cases there may be results that are confusing or concerning to you. Not all laboratory results come back in the same time frame and the provider may be waiting for multiple results in order to interpret others.  Please give [place] 48 hours in order for your provider to thoroughly review all the results before contacting the office for clarification of your results.   Thank you for choosing [facility] Noralyn Pick, CRNP

## 2019-07-29 NOTE — Telephone Encounter (Signed)
Transition Care Management Follow-up Telephone Call  Date of discharge and from where: [facility]  How have you been since you were released from the hospital? better  Any questions or concerns? No   Items Reviewed:  Did the pt receive and understand the discharge instructions provided? Yes   Medications obtained and verified? Yes   Any new allergies since your discharge? No   Dietary orders reviewed? Yes  Do you have support at home? Yes   Functional Questionnaire: (I = Independent and D = Dependent) ADLs: I  Bathing/Dressing- I  Meal Prep- I  Eating- I  Maintaining continence- I  Transferring/Ambulation- I  Managing Meds- I  Follow up appointments reviewed:   PCP Hospital f/u appt confirmed? Yes  Scheduled to see Dr. Diona Browner on 08/02/2019 @ 2:20p..  Are transportation arrangements needed? No   If their condition worsens, is the pt aware to call PCP or go to the Emergency Dept.? Yes  Was the patient provided with contact information for the PCP's office or ED? Yes  Was to pt encouraged to call back with questions or concerns? Yes

## 2019-07-31 NOTE — Progress Notes (Signed)
Fascinating I have reviewed your note and colonoscopy report No colon mass-CT looked very suspicious previously  I agree with your excellent plan.  RG

## 2019-08-01 ENCOUNTER — Other Ambulatory Visit (INDEPENDENT_AMBULATORY_CARE_PROVIDER_SITE_OTHER): Payer: Medicare HMO

## 2019-08-01 DIAGNOSIS — K523 Indeterminate colitis: Secondary | ICD-10-CM

## 2019-08-01 LAB — CBC WITH DIFFERENTIAL/PLATELET
Basophils Absolute: 0 10*3/uL (ref 0.0–0.1)
Basophils Relative: 0.3 % (ref 0.0–3.0)
Eosinophils Absolute: 0.1 10*3/uL (ref 0.0–0.7)
Eosinophils Relative: 0.8 % (ref 0.0–5.0)
HCT: 40.6 % (ref 39.0–52.0)
Hemoglobin: 14.1 g/dL (ref 13.0–17.0)
Lymphocytes Relative: 16.6 % (ref 12.0–46.0)
Lymphs Abs: 2.1 10*3/uL (ref 0.7–4.0)
MCHC: 34.7 g/dL (ref 30.0–36.0)
MCV: 90.4 fl (ref 78.0–100.0)
Monocytes Absolute: 1.2 10*3/uL — ABNORMAL HIGH (ref 0.1–1.0)
Monocytes Relative: 9.3 % (ref 3.0–12.0)
Neutro Abs: 9.1 10*3/uL — ABNORMAL HIGH (ref 1.4–7.7)
Neutrophils Relative %: 73 % (ref 43.0–77.0)
Platelets: 409 10*3/uL — ABNORMAL HIGH (ref 150.0–400.0)
RBC: 4.49 Mil/uL (ref 4.22–5.81)
RDW: 13.4 % (ref 11.5–15.5)
WBC: 12.4 10*3/uL — ABNORMAL HIGH (ref 4.0–10.5)

## 2019-08-01 LAB — HEPATIC FUNCTION PANEL
ALT: 108 U/L — ABNORMAL HIGH (ref 0–53)
AST: 28 U/L (ref 0–37)
Albumin: 3.5 g/dL (ref 3.5–5.2)
Alkaline Phosphatase: 109 U/L (ref 39–117)
Bilirubin, Direct: 0.1 mg/dL (ref 0.0–0.3)
Total Bilirubin: 0.6 mg/dL (ref 0.2–1.2)
Total Protein: 6.8 g/dL (ref 6.0–8.3)

## 2019-08-01 LAB — BASIC METABOLIC PANEL
BUN: 10 mg/dL (ref 6–23)
CO2: 27 mEq/L (ref 19–32)
Calcium: 9 mg/dL (ref 8.4–10.5)
Chloride: 110 mEq/L (ref 96–112)
Creatinine, Ser: 0.99 mg/dL (ref 0.40–1.50)
GFR: 74 mL/min (ref >60.00)
Glucose, Bld: 106 mg/dL — ABNORMAL HIGH (ref 70–99)
Potassium: 3.7 mEq/L (ref 3.5–5.1)
Sodium: 133 mEq/L — ABNORMAL LOW (ref 135–145)

## 2019-08-01 LAB — C-REACTIVE PROTEIN: CRP: 6.1 mg/dL (ref 0.5–20.0)

## 2019-08-02 ENCOUNTER — Other Ambulatory Visit: Payer: Medicare HMO

## 2019-08-02 ENCOUNTER — Other Ambulatory Visit: Payer: Self-pay

## 2019-08-02 ENCOUNTER — Ambulatory Visit (INDEPENDENT_AMBULATORY_CARE_PROVIDER_SITE_OTHER): Payer: Medicare HMO | Admitting: Family Medicine

## 2019-08-02 ENCOUNTER — Telehealth: Payer: Self-pay | Admitting: Nurse Practitioner

## 2019-08-02 ENCOUNTER — Encounter: Payer: Self-pay | Admitting: Family Medicine

## 2019-08-02 VITALS — BP 76/56 | HR 106 | Temp 97.7°F | Ht 66.0 in | Wt 162.5 lb

## 2019-08-02 DIAGNOSIS — K529 Noninfective gastroenteritis and colitis, unspecified: Secondary | ICD-10-CM

## 2019-08-02 DIAGNOSIS — I998 Other disorder of circulatory system: Secondary | ICD-10-CM

## 2019-08-02 DIAGNOSIS — K523 Indeterminate colitis: Secondary | ICD-10-CM

## 2019-08-02 DIAGNOSIS — I959 Hypotension, unspecified: Secondary | ICD-10-CM

## 2019-08-02 DIAGNOSIS — K56609 Unspecified intestinal obstruction, unspecified as to partial versus complete obstruction: Secondary | ICD-10-CM

## 2019-08-02 DIAGNOSIS — E43 Unspecified severe protein-calorie malnutrition: Secondary | ICD-10-CM

## 2019-08-02 MED ORDER — METRONIDAZOLE 500 MG PO TABS
500.0000 mg | ORAL_TABLET | Freq: Two times a day (BID) | ORAL | 0 refills | Status: DC
Start: 2019-08-02 — End: 2019-08-04

## 2019-08-02 MED ORDER — CIPROFLOXACIN HCL 500 MG PO TABS
500.0000 mg | ORAL_TABLET | Freq: Two times a day (BID) | ORAL | 0 refills | Status: DC
Start: 2019-08-02 — End: 2019-08-04

## 2019-08-02 NOTE — Assessment & Plan Note (Signed)
Weight loss with the colitis...significant.  Reviewed ways to increase nutrition in detail with patient.

## 2019-08-02 NOTE — Assessment & Plan Note (Signed)
Resolved after decompression, No mass remained.. but planned reassessement with CT in 4 weeks per GI.

## 2019-08-02 NOTE — Assessment & Plan Note (Signed)
Given wbc trending back up and pt diarrhea no better.. I will contact GI for further recommendations. Encouraged patient to return stool sample even though just watery stool.  Addendum: After discussion with Dr. Lyndel Safe and NP Ms. Berniece Pap.  Plan: Blood cultures  Start on Cipro, flagyl x 10 days.  Eval with CTA abd pelvis for ischemic colitis.

## 2019-08-02 NOTE — Assessment & Plan Note (Signed)
Pt not orthostatic on exam, nontoxic appear but very low BP despite Decreased dose of Coreg.  Will have him hold this.  Reviewed importance of keeping up with stool fluid losses. Increase fluid intake.  return precautions given.  Addendum: Will r/o sepsis with blood cultures.

## 2019-08-02 NOTE — Telephone Encounter (Signed)
Thx Colleen. I was going to text you. I could not figure out how to enter your name into the conversation Please call or text me anytime  RG

## 2019-08-02 NOTE — Patient Instructions (Signed)
Hold coreg for now given low blood pressure.. restart if BP > 140/90.  Increase fluids  X 3 .. drink Gatorade or water. Hold jucie for now.

## 2019-08-02 NOTE — Telephone Encounter (Signed)
Beth please contact the patient to schedule a stat abd/pelvic CT angiogram with IV contrast rule out abdominal ischemia as recommended per Dr. Lyndel Safe.  Dr. Lyndel Safe, you and I were communicating with Dr. Diona Browner at the same time. Patient's  WBC is elevated. BP remains low.  Dr. Diona Browner will order the blood cultures and she will then prescribe Cipro 500mg  po bid and Flagyl 500mg  po bid x 10 days as you recommended.   Our office will facilitate scheduling the abdominal/pelvic CT angiogram.   GI pathogen panel ordered, not yet completed.   Thank you. [employer]

## 2019-08-02 NOTE — Progress Notes (Signed)
Chief Complaint  Patient presents with  . Hospitalization Follow-up    History of Present Illness: HPI    74 year old male presents for hospital follow up.   Admitted: 07/23/2019 Discharged 07/28/2019  Small bowel obstruction -Patient was admitted with small bowel obstruction with CT showing obstructing lesion in the sigmoid colon -Status post colonoscopy on 07/26/2019 by GI which did not show any mass/stricture -Follow-up CT after colonoscopy showed interval decompression of the small and large bowel, and also new development of circumferential large bowel wall thickening proximal to the location of the previously radiographically seen obstruction, consistent with inflammatory or infectious colitis -NG tube has been removed. -Diet has been advanced and patient is tolerating advance diet.  He is having diarrhea and currently is on loperamide which would be continued on discharge.  GI has cleared the patient for discharge.  Outpatient follow-up with GI.  Coronary artery disease Hypertension -Blood pressure stable. Resume metoprolol and Plavix on discharge.  Will keep ACE inhibitor and statin on hold for now. -Blood pressure is on the lower side so will keep Imdur on hold as well.  Outpatient follow-up with cardiology.  AKI on CKD stage IIIa -Baseline creatinine 1.2, admitted with creatinine 1.6 -Resolved with hydration  Depression -Restart Effexor on discharge  Prediabetes -A1c 6.2.   Outpatient follow-up.  Sleep apnea -Patient does not use CPAP  Severe malnutrition--follow nutrition recommendations   08/02/19   Followed up with GI on 07/29/2019 ( reviewed note in detail)  He is very tired, losing weight. No dizziness.  Still loose stool... every time he eats.  No blood in stool.  Decreased appetite, eating Boost. No further abdominal pain but not feeling better... feeling like he is getting worse again.   No emesis since discharge  Wt Readings from Last 3  Encounters:  08/02/19 162 lb 8 oz (73.7 kg)  07/29/19 163 lb 8 oz (74.2 kg)  07/25/19 162 lb 3.2 oz (73.6 kg)   Biopsies without evidence of IBD or malignancy.  A repeat abdominal/pelvic CT scan was done 7/13 which showed interval improvement in the caliber of the small bowel and decompression of the colon with near resolution of previously noted large bowel obstruction with the interval development of infectious or inflammatory colitis involving the entire colon proximal to the point of sigmoid obstruction. Has pending GI panel.   GI Plan repeat CT abd in 4 weeks   Hypotension:  BP is remaining low despite 1/2 dose of coreg. Of note he has had low BPs throughout this illness.. was only improved when He was receiving IVF in hospital.  He is drinking gatorade, juice.. but only 12 oz of liquids a day. BP Readings from Last 3 Encounters:  08/02/19 (!) 76/56  07/29/19 (!) 82/58  07/28/19 (!) 116/93  No fever. Labs yesterday showed increase  Back up of wbc 12.4, platelets 409  LFTs improved.   This visit occurred during the SARS-CoV-2 public health emergency.  Safety protocols were in place, including screening questions prior to the visit, additional usage of staff PPE, and extensive cleaning of exam room while observing appropriate contact time as indicated for disinfecting solutions.   COVID 19 screen:  No recent travel or known exposure to COVID19 The patient denies respiratory symptoms of COVID 19 at this time. The importance of social distancing was discussed today.     Review of Systems  Constitutional: Positive for malaise/fatigue and weight loss. Negative for chills and fever.  HENT: Negative for congestion and  ear pain.   Eyes: Negative for pain and redness.  Respiratory: Negative for cough and shortness of breath.   Cardiovascular: Negative for chest pain, palpitations and leg swelling.  Gastrointestinal: Positive for diarrhea and nausea. Negative for abdominal pain, blood in  stool, constipation and vomiting.  Genitourinary: Negative for dysuria.  Musculoskeletal: Negative for falls and myalgias.  Skin: Negative for rash.  Neurological: Negative for dizziness.  Psychiatric/Behavioral: Negative for depression. The patient is not nervous/anxious.       Past Medical History:  Diagnosis Date  . Basal cell carcinoma    Bilateral Legs  . CAD (coronary artery disease)    a.  stent to the RCA in 2001;  b.  Negative Myoview in January 2012;  c.  LHC 4/16:  dLM 20, small D1 50-70, D2 sub-total, oLCx 50-70, RCA stent ok, dPLB 90, EF 35-40% >> DES to D2  . Depression   . History of echocardiogram    a.  Echo 4/16: Mild LVH, EF 60-63%, grade 1 diastolic dysfunction, normal wall motion, MAC  . HLD (hyperlipidemia)   . HTN (hypertension)   . OSA (obstructive sleep apnea)   . Vertigo     reports that he quit smoking about 19 years ago. His smoking use included cigarettes. He has a 25.00 pack-year smoking history. He has never used smokeless tobacco. He reports current alcohol use of about 3.0 standard drinks of alcohol per week. He reports that he does not use drugs.   Current Outpatient Medications:  .  carvedilol (COREG) 12.5 MG tablet, Take 6.75 mg by mouth 2 (two) times daily with a meal., Disp: , Rfl:  .  clopidogrel (PLAVIX) 75 MG tablet, TAKE 1 TABLET BY MOUTH EVERY DAY, Disp: 90 tablet, Rfl: 3 .  dicyclomine (BENTYL) 20 MG tablet, Take 1 tablet (20 mg total) by mouth every 6 (six) hours as needed (abdominal pain)., Disp: 30 tablet, Rfl: 0 .  Glucosamine HCl (GLUCOSAMINE PO), Take 2 tablets by mouth daily., Disp: , Rfl:  .  nitroGLYCERIN (NITROSTAT) 0.4 MG SL tablet, Place 1 tablet (0.4 mg total) under the tongue every 5 (five) minutes x 3 doses as needed for chest pain., Disp: 30 tablet, Rfl: 0 .  omeprazole (PRILOSEC) 20 MG capsule, Take 1 capsule (20 mg total) by mouth daily before breakfast., Disp: , Rfl:  .  ondansetron (ZOFRAN) 4 MG tablet, Take 1 tablet (4 mg  total) by mouth every 8 (eight) hours as needed for nausea or vomiting., Disp: 20 tablet, Rfl: 0 .  venlafaxine XR (EFFEXOR-XR) 75 MG 24 hr capsule, TAKE 1 CAPSULE (75 MG TOTAL) BY MOUTH DAILY WITH BREAKFAST., Disp: 90 capsule, Rfl: 1   Observations/Objective: Blood pressure (!) 76/56, pulse (!) 106, temperature 97.7 F (36.5 C), temperature source Temporal, height _0  (1.676 m), weight 162 lb 8 oz (73.7 kg), SpO2 96 %.  Physical Exam Constitutional:      Appearance: He is well-developed. He is ill-appearing.     Comments: fatigued appearing but non toxic  HENT:     Head: Normocephalic.     Right Ear: Hearing normal.     Left Ear: Hearing normal.     Nose: Nose normal.  Neck:     Thyroid: No thyroid mass or thyromegaly.     Vascular: No carotid bruit.     Trachea: Trachea normal.  Cardiovascular:     Rate and Rhythm: Normal rate and regular rhythm.     Pulses: Normal pulses.  Heart sounds: Heart sounds not distant. No murmur heard.  No friction rub. No gallop.      Comments: No peripheral edema Pulmonary:     Effort: Pulmonary effort is normal. No respiratory distress.     Breath sounds: Normal breath sounds.  Abdominal:     General: Bowel sounds are increased.     Tenderness: There is no abdominal tenderness. There is no guarding.  Skin:    General: Skin is warm and dry.     Findings: No rash.  Psychiatric:        Speech: Speech normal.        Behavior: Behavior normal.        Thought Content: Thought content normal.      Assessment and Plan   Colitis, acute Given wbc trending back up and pt diarrhea no better.. I will contact GI for further recommendations. Encouraged patient to return stool sample even though just watery stool.  Addendum: After discussion with Dr. Lyndel Safe and NP Ms. Berniece Pap.  Plan: Blood cultures  Start on Cipro, flagyl x 10 days.  Eval with CTA abd pelvis for ischemic colitis.  Large bowel obstruction (HCC) Resolved after  decompression, No mass remained.. but planned reassessement with CT in 4 weeks per GI.  Hypotension Pt not orthostatic on exam, nontoxic appear but very low BP despite Decreased dose of Coreg.  Will have him hold this.  Reviewed importance of keeping up with stool fluid losses. Increase fluid intake.  return precautions given.  Addendum: Will r/o sepsis with blood cultures.  Protein-calorie malnutrition, severe Weight loss with the colitis...significant.  Reviewed ways to increase nutrition in detail with patient.  Will follow up closely with pt given   Eliezer Lofts, MD

## 2019-08-03 ENCOUNTER — Ambulatory Visit (HOSPITAL_COMMUNITY)
Admission: RE | Admit: 2019-08-03 | Discharge: 2019-08-03 | Disposition: A | Discharge status: Home / Self Care | Payer: Medicare HMO | Source: Ambulatory Visit | Attending: Nurse Practitioner | Admitting: Nurse Practitioner

## 2019-08-03 ENCOUNTER — Other Ambulatory Visit: Payer: Medicare HMO

## 2019-08-03 DIAGNOSIS — I959 Hypotension, unspecified: Secondary | ICD-10-CM

## 2019-08-03 DIAGNOSIS — I728 Aneurysm of other specified arteries: Secondary | ICD-10-CM | POA: Diagnosis not present

## 2019-08-03 DIAGNOSIS — I998 Other disorder of circulatory system: Secondary | ICD-10-CM | POA: Diagnosis not present

## 2019-08-03 DIAGNOSIS — K6389 Other specified diseases of intestine: Secondary | ICD-10-CM | POA: Diagnosis not present

## 2019-08-03 DIAGNOSIS — K529 Noninfective gastroenteritis and colitis, unspecified: Secondary | ICD-10-CM | POA: Diagnosis not present

## 2019-08-03 DIAGNOSIS — I700 Atherosclerosis of aorta: Secondary | ICD-10-CM | POA: Diagnosis not present

## 2019-08-03 MED ORDER — IOHEXOL 350 MG/ML SOLN
100.0000 mL | Freq: Once | INTRAVENOUS | Status: AC | PRN
  Administered 2019-08-03: 100 mL via INTRAVENOUS

## 2019-08-03 NOTE — Telephone Encounter (Signed)
Order in [place]. Radiology will schedule the imaging when it is approved by insurance.

## 2019-08-03 NOTE — Telephone Encounter (Signed)
Spoke with patient. Roy [name] has agreed to take this STAT CT at 4:30pm today if the patient can arrive at 4:15pm. Patient agrees. He will fast except for clear liquids for 4 hours prior to the scheduled procedure. Instructed to go to the CT dept at [facility] and arrive at 4:15pm

## 2019-08-03 NOTE — Telephone Encounter (Signed)
I called the patient for an update today. Dr. Lyndel Safe verified ok for him to take Imodium as needed. He stated feeling better today and he did not need the Imodium at this time. He passed 3 small volume loose stools this morning. No abdominal pain. No dizziness, CP or palpitations. He is drinking more fluids. He went to the lab this am and had blood cultures done. Pt to start Cipro and Flagyl, Dr. Diona Browner was to prescribe. Await abd/pelvic CT angiogram results.

## 2019-08-03 NOTE — Telephone Encounter (Signed)
Authorization [id number] from [facility] patient to notify him he was being scheduled. Patient not answering the phone. Called x 3. 4th call, left a message of my call and asked he call me back about this asap.

## 2019-08-03 NOTE — Telephone Encounter (Signed)
Called friend listed on the patient's chart as a contact (not designated party to receive medical information). Left a message asking if he would help [place] get in touch with the patient. Left on the voicemail our contact information.

## 2019-08-04 ENCOUNTER — Other Ambulatory Visit: Payer: Self-pay | Admitting: Gastroenterology

## 2019-08-04 ENCOUNTER — Telehealth: Payer: Self-pay | Admitting: Family Medicine

## 2019-08-04 LAB — GI PROFILE, STOOL, PCR

## 2019-08-04 MED ORDER — DOXYCYCLINE HYCLATE 100 MG PO TABS
100.0000 mg | ORAL_TABLET | Freq: Two times a day (BID) | ORAL | 0 refills | Status: AC
Start: 2019-08-04 — End: 2019-08-09

## 2019-08-04 NOTE — Addendum Note (Signed)
Addended by: Carter Kitten on: 08/04/2019 10:09 AM   Modules accepted: Orders

## 2019-08-04 NOTE — Telephone Encounter (Signed)
Call pt.. let him know I sent in the prescriptions for the antibiotics after talking to him Monday evening. I do not know why pharmacy did not call him but they should be waiting for him. He needs to start ASAP. [id number] - [place], [place] (Ph: [phone])

## 2019-08-04 NOTE — Telephone Encounter (Signed)
[name] notified as instructed by telephone.  He wanted to let Dr. Diona Browner know that his GI doctor also sent in an antibiotic (doxycycline).  He wants to make sure everyone is on the same page and wants to know what he is suppose to be taking.  Please advise.

## 2019-08-04 NOTE — Progress Notes (Signed)
CTA-reviewed.  Showing sigmoid masslike appearance.  No obstruction as before. Negative colonoscopy 07/26/2019 for any masses. GI pathogen positive for Yersinia (may be resistant to fluoroquinolones as he has been treated with that in past)  Plan: -Doxycycline 100 mg p.o. twice daily x 5 days -Follow-up with me in 2 weeks -If he has any N/V/Abdo pain/or any bleeding, he needs to let us know and come over to ED. -There is no obstruction.  He can continue soft diet.  RG

## 2019-08-04 NOTE — Telephone Encounter (Signed)
Mr. Mccubbins notified as instructed by telephone.  Patient states understanding.  Medication list updated.

## 2019-08-04 NOTE — Telephone Encounter (Signed)
Saw Dr. Steve Rattler and Berniece Pap ( GI) note.. plan changed after CTA... go ahead and do the doxy they recommended and  do not pick up the cipro and flagyl.

## 2019-08-05 NOTE — Telephone Encounter (Signed)
Wonderful! Thank you both for your excellent care!

## 2019-08-05 NOTE — Telephone Encounter (Signed)
Dr. Lyndel Safe and Dr. Diona Browner, I called Roy [name], he is feeling better. He is taking only the Doxycyline as prescribed. He is drinking more fluid and he is tolerating a soft diet. His diarrhea is less, he had 2 small volume diarrhea Bms so far today. He will call our office if his symptoms worsen. He will follow up with Dr. Lyndel Safe on 08/18/2019 as scheduled.

## 2019-08-09 LAB — CULTURE, BLOOD (SINGLE)

## 2019-08-09 NOTE — Telephone Encounter (Signed)
Record review suggests patient may have had a fecal impaction in the sigmoid colon during recent hospital stay.  I recommend avoiding medicines that may exacerbate that such as dicyclomine or imodium  Miralax one half capful in a glass of water daily (AM) and one tablespoon of benefiber in a glass of water each evening.  - HD

## 2019-08-10 ENCOUNTER — Emergency Department (HOSPITAL_COMMUNITY): Payer: Medicare HMO

## 2019-08-10 ENCOUNTER — Inpatient Hospital Stay (HOSPITAL_COMMUNITY)
Admission: EM | Admit: 2019-08-10 | Discharge: 2019-08-15 | DRG: 392 | Disposition: A | Discharge status: Home / Self Care | Payer: Medicare HMO | Attending: Internal Medicine | Admitting: Internal Medicine

## 2019-08-10 ENCOUNTER — Encounter (HOSPITAL_COMMUNITY): Payer: Self-pay

## 2019-08-10 ENCOUNTER — Telehealth: Payer: Self-pay | Admitting: Nurse Practitioner

## 2019-08-10 ENCOUNTER — Other Ambulatory Visit: Payer: Self-pay

## 2019-08-10 DIAGNOSIS — K5939 Other megacolon: Secondary | ICD-10-CM | POA: Diagnosis not present

## 2019-08-10 DIAGNOSIS — E876 Hypokalemia: Secondary | ICD-10-CM | POA: Diagnosis present

## 2019-08-10 DIAGNOSIS — K56600 Partial intestinal obstruction, unspecified as to cause: Secondary | ICD-10-CM | POA: Diagnosis present

## 2019-08-10 DIAGNOSIS — N183 Chronic kidney disease, stage 3 unspecified: Secondary | ICD-10-CM | POA: Diagnosis not present

## 2019-08-10 DIAGNOSIS — Z9841 Cataract extraction status, right eye: Secondary | ICD-10-CM

## 2019-08-10 DIAGNOSIS — G4733 Obstructive sleep apnea (adult) (pediatric): Secondary | ICD-10-CM | POA: Diagnosis present

## 2019-08-10 DIAGNOSIS — I129 Hypertensive chronic kidney disease with stage 1 through stage 4 chronic kidney disease, or unspecified chronic kidney disease: Secondary | ICD-10-CM | POA: Diagnosis not present

## 2019-08-10 DIAGNOSIS — Z87891 Personal history of nicotine dependence: Secondary | ICD-10-CM

## 2019-08-10 DIAGNOSIS — Z20822 Contact with and (suspected) exposure to covid-19: Secondary | ICD-10-CM | POA: Diagnosis not present

## 2019-08-10 DIAGNOSIS — E872 Acidosis, unspecified: Secondary | ICD-10-CM | POA: Diagnosis present

## 2019-08-10 DIAGNOSIS — R197 Diarrhea, unspecified: Secondary | ICD-10-CM | POA: Diagnosis not present

## 2019-08-10 DIAGNOSIS — Z974 Presence of external hearing-aid: Secondary | ICD-10-CM

## 2019-08-10 DIAGNOSIS — K529 Noninfective gastroenteritis and colitis, unspecified: Secondary | ICD-10-CM | POA: Diagnosis not present

## 2019-08-10 DIAGNOSIS — F329 Major depressive disorder, single episode, unspecified: Secondary | ICD-10-CM | POA: Diagnosis present

## 2019-08-10 DIAGNOSIS — N179 Acute kidney failure, unspecified: Secondary | ICD-10-CM | POA: Diagnosis present

## 2019-08-10 DIAGNOSIS — K6389 Other specified diseases of intestine: Secondary | ICD-10-CM | POA: Diagnosis not present

## 2019-08-10 DIAGNOSIS — I9589 Other hypotension: Secondary | ICD-10-CM | POA: Diagnosis not present

## 2019-08-10 DIAGNOSIS — I10 Essential (primary) hypertension: Secondary | ICD-10-CM | POA: Diagnosis present

## 2019-08-10 DIAGNOSIS — R933 Abnormal findings on diagnostic imaging of other parts of digestive tract: Secondary | ICD-10-CM | POA: Diagnosis not present

## 2019-08-10 DIAGNOSIS — E1165 Type 2 diabetes mellitus with hyperglycemia: Secondary | ICD-10-CM | POA: Diagnosis not present

## 2019-08-10 DIAGNOSIS — K5732 Diverticulitis of large intestine without perforation or abscess without bleeding: Principal | ICD-10-CM | POA: Diagnosis present

## 2019-08-10 DIAGNOSIS — E861 Hypovolemia: Secondary | ICD-10-CM

## 2019-08-10 DIAGNOSIS — K828 Other specified diseases of gallbladder: Secondary | ICD-10-CM | POA: Diagnosis not present

## 2019-08-10 DIAGNOSIS — I2510 Atherosclerotic heart disease of native coronary artery without angina pectoris: Secondary | ICD-10-CM | POA: Diagnosis not present

## 2019-08-10 DIAGNOSIS — A419 Sepsis, unspecified organism: Secondary | ICD-10-CM | POA: Diagnosis not present

## 2019-08-10 DIAGNOSIS — R21 Rash and other nonspecific skin eruption: Secondary | ICD-10-CM | POA: Diagnosis not present

## 2019-08-10 DIAGNOSIS — E785 Hyperlipidemia, unspecified: Secondary | ICD-10-CM | POA: Diagnosis present

## 2019-08-10 DIAGNOSIS — Z955 Presence of coronary angioplasty implant and graft: Secondary | ICD-10-CM

## 2019-08-10 DIAGNOSIS — D7389 Other diseases of spleen: Secondary | ICD-10-CM | POA: Diagnosis not present

## 2019-08-10 DIAGNOSIS — H9193 Unspecified hearing loss, bilateral: Secondary | ICD-10-CM | POA: Diagnosis present

## 2019-08-10 DIAGNOSIS — I959 Hypotension, unspecified: Secondary | ICD-10-CM | POA: Diagnosis not present

## 2019-08-10 DIAGNOSIS — E1122 Type 2 diabetes mellitus with diabetic chronic kidney disease: Secondary | ICD-10-CM | POA: Diagnosis not present

## 2019-08-10 DIAGNOSIS — Z9842 Cataract extraction status, left eye: Secondary | ICD-10-CM | POA: Diagnosis not present

## 2019-08-10 DIAGNOSIS — Z7902 Long term (current) use of antithrombotics/antiplatelets: Secondary | ICD-10-CM | POA: Diagnosis not present

## 2019-08-10 DIAGNOSIS — Z818 Family history of other mental and behavioral disorders: Secondary | ICD-10-CM

## 2019-08-10 DIAGNOSIS — R000 Tachycardia, unspecified: Secondary | ICD-10-CM | POA: Diagnosis not present

## 2019-08-10 DIAGNOSIS — Z79899 Other long term (current) drug therapy: Secondary | ICD-10-CM | POA: Diagnosis not present

## 2019-08-10 DIAGNOSIS — R69 Illness, unspecified: Secondary | ICD-10-CM | POA: Diagnosis not present

## 2019-08-10 DIAGNOSIS — Z811 Family history of alcohol abuse and dependence: Secondary | ICD-10-CM

## 2019-08-10 DIAGNOSIS — Z8249 Family history of ischemic heart disease and other diseases of the circulatory system: Secondary | ICD-10-CM

## 2019-08-10 LAB — COMPREHENSIVE METABOLIC PANEL
ALT: 30 U/L (ref 0–44)
AST: 21 U/L (ref 15–41)
Albumin: 2.7 g/dL — ABNORMAL LOW (ref 3.5–5.0)
Alkaline Phosphatase: 86 U/L (ref 38–126)
Anion gap: 19 — ABNORMAL HIGH (ref 5–15)
BUN: 35 mg/dL — ABNORMAL HIGH (ref 8–23)
CO2: 19 mmol/L — ABNORMAL LOW (ref 22–32)
Calcium: 8.2 mg/dL — ABNORMAL LOW (ref 8.9–10.3)
Chloride: 97 mmol/L — ABNORMAL LOW (ref 98–111)
Creatinine, Ser: 2.34 mg/dL — ABNORMAL HIGH (ref 0.61–1.24)
GFR calc Af Amer: 31 mL/min — ABNORMAL LOW (ref >60)
GFR calc non Af Amer: 27 mL/min — ABNORMAL LOW (ref >60)
Glucose, Bld: 154 mg/dL — ABNORMAL HIGH (ref 70–99)
Potassium: 2.6 mmol/L — CL (ref 3.5–5.1)
Sodium: 135 mmol/L (ref 135–145)
Total Bilirubin: 0.9 mg/dL (ref 0.3–1.2)
Total Protein: 6.1 g/dL — ABNORMAL LOW (ref 6.5–8.1)

## 2019-08-10 LAB — LIPASE, BLOOD: Lipase: 18 U/L (ref 11–51)

## 2019-08-10 LAB — PROTIME-INR
INR: 1.2 (ref 0.8–1.2)
Prothrombin Time: 14.5 seconds (ref 11.4–15.2)

## 2019-08-10 LAB — CBC
HCT: 43.9 % (ref 39.0–52.0)
Hemoglobin: 14.7 g/dL (ref 13.0–17.0)
MCH: 30.3 pg (ref 26.0–34.0)
MCHC: 33.5 g/dL (ref 30.0–36.0)
MCV: 90.5 fL (ref 80.0–100.0)
Platelets: 462 10*3/uL — ABNORMAL HIGH (ref 150–400)
RBC: 4.85 MIL/uL (ref 4.22–5.81)
RDW: 13.9 % (ref 11.5–15.5)
WBC: 10.7 10*3/uL — ABNORMAL HIGH (ref 4.0–10.5)
nRBC: 0 % (ref 0.0–0.2)

## 2019-08-10 LAB — APTT: aPTT: 28 seconds (ref 24–36)

## 2019-08-10 LAB — LACTIC ACID, PLASMA
Lactic Acid, Venous: 6.4 mmol/L (ref 0.5–1.9)
Lactic Acid, Venous: 6.2 mmol/L (ref 0.5–1.9)

## 2019-08-10 LAB — MAGNESIUM: Magnesium: 1.2 mg/dL — ABNORMAL LOW (ref 1.7–2.4)

## 2019-08-10 MED ORDER — ONDANSETRON HCL 4 MG/2ML IJ SOLN
4.0000 mg | Freq: Four times a day (QID) | INTRAMUSCULAR | Status: DC | PRN
  Administered 2019-08-11: 4 mg via INTRAVENOUS
  Filled 2019-08-10: qty 2

## 2019-08-10 MED ORDER — POTASSIUM CHLORIDE 10 MEQ/100ML IV SOLN
10.0000 meq | Freq: Once | INTRAVENOUS | Status: AC
  Administered 2019-08-10: 10 meq via INTRAVENOUS
  Filled 2019-08-10: qty 100

## 2019-08-10 MED ORDER — POTASSIUM CHLORIDE 10 MEQ/100ML IV SOLN
10.0000 meq | Freq: Once | INTRAVENOUS | Status: AC
  Administered 2019-08-11: 10 meq via INTRAVENOUS
  Filled 2019-08-10: qty 100

## 2019-08-10 MED ORDER — SODIUM CHLORIDE 0.9 % IV BOLUS (SEPSIS)
1000.0000 mL | Freq: Once | INTRAVENOUS | Status: AC
  Administered 2019-08-10: 1000 mL via INTRAVENOUS

## 2019-08-10 MED ORDER — POTASSIUM CHLORIDE 10 MEQ/100ML IV SOLN
10.0000 meq | INTRAVENOUS | Status: AC
  Administered 2019-08-11 (×2): 10 meq via INTRAVENOUS
  Filled 2019-08-10: qty 100

## 2019-08-10 MED ORDER — ACETAMINOPHEN 325 MG PO TABS: 650.0000 mg | ORAL_TABLET | Freq: Four times a day (QID) | ORAL | Status: DC | PRN

## 2019-08-10 MED ORDER — ACETAMINOPHEN 650 MG RE SUPP: 650.0000 mg | Freq: Four times a day (QID) | RECTAL | Status: DC | PRN

## 2019-08-10 MED ORDER — MORPHINE SULFATE (PF) 2 MG/ML IV SOLN
1.0000 mg | INTRAVENOUS | Status: DC | PRN
  Administered 2019-08-11: 2 mg via INTRAVENOUS
  Filled 2019-08-10: qty 1

## 2019-08-10 MED ORDER — POTASSIUM CHLORIDE IN NACL 20-0.9 MEQ/L-% IV SOLN
INTRAVENOUS | Status: DC
  Filled 2019-08-10 (×9): qty 1000

## 2019-08-10 MED ORDER — SODIUM CHLORIDE 0.9 % IV BOLUS (SEPSIS)
250.0000 mL | Freq: Once | INTRAVENOUS | Status: AC
  Administered 2019-08-10: 250 mL via INTRAVENOUS

## 2019-08-10 MED ORDER — ONDANSETRON HCL 4 MG PO TABS: 4.0000 mg | ORAL_TABLET | Freq: Four times a day (QID) | ORAL | Status: DC | PRN

## 2019-08-10 MED ORDER — PIPERACILLIN-TAZOBACTAM 3.375 G IVPB
3.3750 g | Freq: Once | INTRAVENOUS | Status: AC
  Administered 2019-08-10: 3.375 g via INTRAVENOUS
  Filled 2019-08-10: qty 50

## 2019-08-10 NOTE — ED Notes (Signed)
Patient transported to CT 

## 2019-08-10 NOTE — Telephone Encounter (Signed)
I was out of the office this morning and just now seeing this message after finishing procedures.  Reviewing the chart, I see that this patient sent his PCP a portal message about 40 minutes ago stating he is returning to the [facility] ED.

## 2019-08-10 NOTE — Telephone Encounter (Signed)
Noted  Will follow along

## 2019-08-10 NOTE — H&P (Signed)
[facility] History and Physical  JOHANN SANTONE SHF:[id number] DOB: 1945/11/03 DOA: 08/10/2019   PCP: Jinny Sanders, MD  Specialists: Recently seen by [facility] gastroenterology  Chief Complaint: Nausea vomiting and diarrhea with abdominal pain  HPI: Roy [name] is a 74 y.o. male with a past medical history of coronary artery disease, prediabetes, depression who was recently hospitalized for abdominal pain, found to have bowel obstruction requiring NG tube.  Underwent colonoscopy which did not reveal any masses but showed diverticulosis.  He was discharged after he improved.  He was found to have Yersinia in his GI pathogen panel for which he was prescribed doxycycline for a 5-day course.  He was discharged on Wednesday.  He said that he was feeling well up until Sunday night when he started developing abdominal pain again.  He started having loose stools which are watery.  Denies any blood in the stool.  Started having vomiting.  Decreased appetite and felt fatigued.  Has also had difficulty passing urine.  Denies any fever or chills.  He must have had at least 10 episodes of loose stools in the last 24 hours and has vomited about 4 times.  After getting treated in the ED he is feeling better.  Has not vomited here in the ED yet.  His abdominal pain was in the center of his abdomen occasionally radiating to the back.  6 out of 10 in intensity.  Currently is 1 out of 10 in intensity.  He also admits to feeling lightheaded but denies any syncopal episodes.  In the emergency department he underwent blood work which revealed severe lactic acidosis, acute renal failure, hyperkalemia.  A CT scan was done again and shows colonic wall thickening and concern for partial colonic obstruction likely due to diverticulitis.  He will need hospitalization for further management.  Home Medications: Prior to Admission medications   Medication Sig Start Date End Date Taking? Authorizing Provider    clopidogrel (PLAVIX) 75 MG tablet TAKE 1 TABLET BY MOUTH EVERY DAY Patient taking differently: Take 75 mg by mouth daily.  10/12/18  Yes Josue Hector, MD  dicyclomine (BENTYL) 20 MG tablet Take 1 tablet (20 mg total) by mouth every 6 (six) hours as needed (abdominal pain). 07/28/19  Yes Aline August, MD  Glucosamine HCl (GLUCOSAMINE PO) Take 2 tablets by mouth daily.   Yes [provider]  Magnesium 200 MG TABS Take 200 mg by mouth daily.   Yes [provider]  nitroGLYCERIN (NITROSTAT) 0.4 MG SL tablet Place 1 tablet (0.4 mg total) under the tongue every 5 (five) minutes x 3 doses as needed for chest pain. 07/28/19  Yes Aline August, MD  omeprazole (PRILOSEC) 20 MG capsule Take 1 capsule (20 mg total) by mouth daily before breakfast. 07/28/19  Yes Aline August, MD  ondansetron (ZOFRAN) 4 MG tablet Take 1 tablet (4 mg total) by mouth every 8 (eight) hours as needed for nausea or vomiting. 07/01/19  Yes [name], [name], MD  venlafaxine XR (EFFEXOR-XR) 75 MG 24 hr capsule TAKE 1 CAPSULE (75 MG TOTAL) BY MOUTH DAILY WITH BREAKFAST. 06/20/19  Yes [name], [name], MD  carvedilol (COREG) 12.5 MG tablet Take 6.25 mg by mouth 2 (two) times daily with a meal.     [provider]    Allergies: No Known Allergies  Past Medical History: Past Medical History:  Diagnosis Date  . Basal cell carcinoma    Bilateral Legs  . CAD (coronary artery disease)  a.  stent to the RCA in 2001;  b.  Negative Myoview in January 2012;  c.  LHC 4/16:  dLM 20, small D1 50-70, D2 sub-total, oLCx 50-70, RCA stent ok, dPLB 90, EF 35-40% >> DES to D2  . Depression   . History of echocardiogram    a.  Echo 4/16: Mild LVH, EF 62-56%, grade 1 diastolic dysfunction, normal wall motion, MAC  . HLD (hyperlipidemia)   . HTN (hypertension)   . OSA (obstructive sleep apnea)   . Vertigo     Past Surgical History:  Procedure Laterality Date  . BIOPSY  07/26/2019   Procedure: BIOPSY;  Surgeon: Gatha Mayer, MD;  Location: [facility];  Service: Endoscopy;;  . CATARACT EXTRACTION, BILATERAL    . COLONOSCOPY N/A 07/26/2019   Procedure: COLONOSCOPY;  Surgeon: Gatha Mayer, MD;  Location: [facility] ENDOSCOPY;  Service: Endoscopy;  Laterality: N/A;  . COLONOSCOPY    . CORONARY STENT PLACEMENT  2001    RCA  . LEFT HEART CATHETERIZATION WITH CORONARY ANGIOGRAM N/A 02/13/2012   Procedure: LEFT HEART CATHETERIZATION WITH CORONARY ANGIOGRAM;  Surgeon: [name], MD;  Location: [facility] CATH LAB;  Service: Cardiovascular;  Laterality: N/A;  . LEFT HEART CATHETERIZATION WITH CORONARY ANGIOGRAM N/A 04/24/2014   Procedure: LEFT HEART CATHETERIZATION WITH CORONARY ANGIOGRAM;  Surgeon: [name], MD;  Location: [facility] CATH LAB;  Service: Cardiovascular;  Laterality: N/A;  . TONSILLECTOMY      Social History: Lives by himself.  No smoking.  Drinks socially but not every day.  No illicit drug use.  Independent with daily activities.  Family History:  Family History  Problem Relation Age of Onset  . Cirrhosis Mother   . Alcohol abuse Mother   . Heart disease Father   . Hypertension Father   . Peripheral vascular disease Father   . Depression Sister   . Heart attack Neg Hx   . Stroke Neg Hx   . Stomach cancer Neg Hx   . Pancreatic cancer Neg Hx   . Colon cancer Neg Hx   . Esophageal cancer Neg Hx      Review of Systems - History obtained from the patient General ROS: positive for  - fatigue Psychological ROS: negative Ophthalmic ROS: negative ENT ROS: negative Allergy and Immunology ROS: negative Hematological and Lymphatic ROS: negative Endocrine ROS: negative Respiratory ROS: no cough, shortness of breath, or wheezing Cardiovascular ROS: no chest pain or dyspnea on exertion Gastrointestinal ROS: As in HPI Genito-Urinary ROS: Has been making less urine Musculoskeletal ROS: negative Neurological ROS: no TIA or stroke symptoms Dermatological ROS: negative  Physical Examination  Vitals:    08/10/19 2115 08/10/19 2130 08/10/19 2200 08/10/19 2245  BP: (!) 86/53 (!) 86/62 (!) 94/63 (!) 104/63  Pulse: (!) 118 103 101 105  Resp: _0 Temp:      TempSrc:      SpO2: 98% 97% 98% (!) 88%  Weight:      Height:        BP (!) 104/63   Pulse 105   Temp 98.1 F (36.7 C) (Oral)   Resp 19   Ht _1  (1.676 m)   Wt 74.3 kg   SpO2 (!) 88%   BMI 26.44 kg/m   General appearance: alert, cooperative, appears stated age and no distress Head: Normocephalic, without obvious abnormality, atraumatic Eyes: conjunctivae/corneas clear. PERRL, EOM's intact.  Throat: lips, mucosa, and tongue normal; teeth and gums normal Neck: no  adenopathy, no carotid bruit, no JVD, supple, symmetrical, trachea midline and thyroid not enlarged, symmetric, no tenderness/mass/nodules Resp: clear to auscultation bilaterally Cardio: regular rate and rhythm, S1, S2 normal, no murmur, click, rub or gallop GI: Abdomen is soft.  Only mildly tender in the central abdomen without any rebound rigidity or guarding.  No masses organomegaly.  Bowel sounds present but sluggish Extremities: extremities normal, atraumatic, no cyanosis or edema Pulses: 2+ and symmetric Skin: Skin color, texture, turgor normal. No rashes or lesions Lymph nodes: Cervical, supraclavicular, and axillary nodes normal. Neurologic: Alert and oriented x3.  No obvious focal neurological deficits.    Labs on Admission: I have personally reviewed following labs and imaging studies  CBC: Recent Labs  Lab 08/10/19 1936  WBC 10.7*  HGB 14.7  HCT 43.9  MCV 90.5  PLT 923*   Basic Metabolic Panel: Recent Labs  Lab 08/10/19 1936  NA 135  K 2.6*  CL 97*  CO2 19*  GLUCOSE 154*  BUN 35*  CREATININE 2.34*  CALCIUM 8.2*   GFR: Estimated Creatinine Clearance: 25.4 mL/min (A) (by C-G formula based on SCr of 2.34 mg/dL (H)). Liver Function Tests: Recent Labs  Lab 08/10/19 1936  AST 21  ALT 30  ALKPHOS 86  BILITOT 0.9  PROT  6.1*  ALBUMIN 2.7*   Recent Labs  Lab 08/10/19 1936  LIPASE 18     Radiological Exams on Admission: CT ABDOMEN PELVIS WO CONTRAST  Result Date: 08/10/2019 CLINICAL DATA:  Abdominal pain and diarrhea for 1 week following colonoscopy EXAM: CT ABDOMEN AND PELVIS WITHOUT CONTRAST TECHNIQUE: Multidetector CT imaging of the abdomen and pelvis was performed following the standard protocol without IV contrast. COMPARISON:  08/03/2019 FINDINGS: Lower chest: Calcified granuloma is noted in the left laterally. Mild scarring is noted in the right lung base stable from the prior exam. Focal herniation of fat is noted to the posteromedial left hemidiaphragm. Hepatobiliary: Gallbladder is well distended. The liver is within normal limits. Pancreas: Unremarkable. No pancreatic ductal dilatation or surrounding inflammatory changes. Spleen: Multiple calcified granulomas are noted. No other focal abnormality is seen. Adrenals/Urinary Tract: Adrenal glands are within normal limits. Kidneys are well visualize without renal calculi or obstructive change. The bladder is within normal limits. Stomach/Bowel: Colon demonstrates scattered diverticular change as well as wall thickening in the sigmoid. Proximal to this there is significant dilatation of the colon with fluid as well as some mild dilatation of distal small bowel. These changes are consistent with a partial colonic obstruction related to the wall thickening seen in the sigmoid. This is again somewhat suspicious for underlying mass although recent colonoscopy shows no definitive mass. Stomach is within normal limits. Vascular/Lymphatic: Aortic atherosclerosis. No enlarged abdominal or pelvic lymph nodes. Reproductive: Prostate is unremarkable. Other: No abdominal wall hernia or abnormality. No abdominopelvic ascites. Musculoskeletal: No acute or significant osseous findings. IMPRESSION: Persistent wall thickening within the sigmoid with more proximal fluid retention in  the colon and distal small bowel dilatation consistent with a partial colonic obstruction. These changes are likely related to diverticulitis as recent colonoscopy shows no definitive mass lesion. No abscess or perforation is noted. Prior granulomatous disease. No other focal abnormality is noted. Electronically Signed   By: Inez Catalina M.D.   On: 08/10/2019 22:34   DG Chest 2 View  Result Date: 08/10/2019 CLINICAL DATA:  74 year old male with sepsis. EXAM: CHEST - 2 VIEW COMPARISON:  Chest radiograph dated 07/23/2019. FINDINGS: Patchy area of density in the lingula may represent  atelectasis. Infiltrate is not excluded clinical correlation is recommended. There is no pleural effusion or pneumothorax. Left lung base calcified granuloma. The cardiac silhouette is within limits. No acute osseous pathology. IMPRESSION: Lingular atelectasis versus infiltrate. Electronically Signed   By: Anner Crete M.D.   On: 08/10/2019 21:53    My interpretation of Electrocardiogram: Sinus tachycardia 103 bpm.  Left axis deviation.  Low voltage noted.  No concerning ischemic changes.   Problem List  Principal Problem:   Acute colitis Active Problems:   Coronary artery disease involving native coronary artery of native heart without angina pectoris   Hypotension   Acute kidney injury superimposed on chronic kidney disease (HCC)   Abnormal CT scan, colon   Lactic acid acidosis   Hypokalemia   Assessment: This is a 74 year old Caucasian male with past medical history as stated earlier who comes in with abdominal pain, nausea vomiting and diarrhea.  He has evidence for either colitis or diverticulitis on CT scan.  He was tachycardic, hypotensive with mildly elevated WBC.  Likely has sepsis.  Plan:  1. Acute colitis versus diverticulitis with partial colonic obstruction with sepsis, present on admission/lactic acidosis: Patient says symptoms have improved.  His lactic acid level significantly elevated most  likely due to combination of hypovolemia and sepsis.  He will be aggressively hydrated.  We will recheck his lactic acid levels.  We will consult gastroenterology to see him again.  At this time is abdomen is benign to examination.  He has not had any vomiting.  If his symptoms worsen then we may need to place NG tube and involve GI and possibly even general surgery.  But for now patient appears to be stable.  GI pathogen Panel on 7/20 was positive for Yersinia which was treated in the outpatient setting with doxycycline.  Stool studies will be repeated including C. difficile.  N.p.o. for now.  2.  Acute renal failure with metabolic acidosis and hypokalemia: All this is likely due to hypovolemia from his GI loss.  He will be aggressively hydrated.  Electrolytes will be corrected.  Check magnesium level.  Monitor urine output.  He mentions that he has noticed some difficulty passing urine and has been passing less urine.  Will do bladder scan to make sure he is not retaining.  No obstructive changes noted on CT scan.  3.  Abnormal chest x-ray: He denies any respiratory symptoms.  Likely atelectasis.  4.  History of coronary artery disease: Hold his cardiac medications for now.  He seems to be stable from a cardiac standpoint.    DVT Prophylaxis: SCDs Code Status: Full code Family Communication: Discussed with the patient Disposition: Hopefully will be able to return home when improved Consults called: [facility] via epic Admission Status: Status is: Inpatient  Remains inpatient appropriate because:Persistent severe electrolyte disturbances, Ongoing diagnostic testing needed not appropriate for outpatient work up and IV treatments appropriate due to intensity of illness or inability to take PO   Dispo: The patient is from: Home              Anticipated d/c is to: Home              Anticipated d/c date is: 2 days              Patient currently is not medically stable to  d/c.     Severity of Illness: The appropriate patient status for this patient is INPATIENT. Inpatient status is judged to be reasonable and necessary  in order to provide the required intensity of service to ensure the patient's safety. The patient's presenting symptoms, physical exam findings, and initial radiographic and laboratory data in the context of their chronic comorbidities is felt to place them at high risk for further clinical deterioration. Furthermore, it is not anticipated that the patient will be medically stable for discharge from the hospital within 2 midnights of admission. The following factors support the patient status of inpatient.   " The patient's presenting symptoms include nausea vomiting diarrhea abdominal pain. " The worrisome physical exam findings include hypotension. " The initial radiographic and laboratory data are worrisome because of colitis/diverticulitis. " The chronic co-morbidities include coronary artery disease.   * I certify that at the point of admission it is my clinical judgment that the patient will require inpatient hospital care spanning beyond 2 midnights from the point of admission due to high intensity of service, high risk for further deterioration and high frequency of surveillance required.*    Further management decisions will depend on results of further testing and patient's response to treatment.   Yunuen Mordan [employer]  [facility] operational officer on [email or web address]  08/10/2019, 11:28 PM

## 2019-08-10 NOTE — Telephone Encounter (Signed)
Patient left a voicemail stating that he wanted Dr. Diona Browner know that he is on his way to [facility] ER now.

## 2019-08-10 NOTE — Telephone Encounter (Signed)
FYI the pt is going to the ED for eval of severe stomach pain

## 2019-08-10 NOTE — ED Triage Notes (Signed)
Pt reports abd pain, emesis and diarrhea since last week, pt had a colonoscopy done last week. Denies any blood in his diarrhea. Pt took last dose of doxycycline last night. Reports 12 episodes of diarrhea in the last 24 hrs

## 2019-08-10 NOTE — ED Provider Notes (Signed)
[facility] EMERGENCY DEPARTMENT Provider Note   CSN: [id number] Arrival date & time: 08/10/19  1613     History Chief Complaint  Patient presents with  . Abdominal Pain  . Diarrhea    Roy [name] is a 74 y.o. male.  Patient is a 74 year old male with past medical history of coronary artery disease with prior stenting, hypertension, hyperlipidemia, and recent admission for bowel obstruction with suspected mass within the sigmoid colon.  This was seen by CT scan, but not identified with colonoscopy.  Patient had been admitted 3 weeks ago with unintentional weight loss, bloating, nausea, and decreased stool output.  Patient underwent colonoscopy with disimpaction and was doing better at the time of discharge.  Over the past several days, he has been having no appetite, decreased p.o. intake, abdominal bloating along with nausea, vomiting, and diarrhea.  This has all been non-bloody and nonbilious.  He denies any fevers or chills.  He does report some weakness and fatigue.  The history is provided by the patient.  Abdominal Pain Pain location:  Generalized Pain quality: cramping   Pain radiates to:  Does not radiate Pain severity:  Moderate Onset quality:  Gradual Duration:  3 weeks Timing:  Constant Progression:  Worsening Chronicity:  New Associated symptoms: diarrhea   Diarrhea Associated symptoms: abdominal pain        Past Medical History:  Diagnosis Date  . Basal cell carcinoma    Bilateral Legs  . CAD (coronary artery disease)    a.  stent to the RCA in 2001;  b.  Negative Myoview in January 2012;  c.  LHC 4/16:  dLM 20, small D1 50-70, D2 sub-total, oLCx 50-70, RCA stent ok, dPLB 90, EF 35-40% >> DES to D2  . Depression   . History of echocardiogram    a.  Echo 4/16: Mild LVH, EF 40-98%, grade 1 diastolic dysfunction, normal wall motion, MAC  . HLD (hyperlipidemia)   . HTN (hypertension)   . OSA (obstructive sleep apnea)   . Vertigo      Patient Active Problem List   Diagnosis Date Noted  . Colitis, acute 07/29/2019  . Abnormal CT scan, colon   . Protein-calorie malnutrition, severe 07/25/2019  . Large bowel obstruction ([place]) 07/23/2019  . Acute kidney injury superimposed on chronic kidney disease ([place]) 07/23/2019  . Controlled type 2 diabetes mellitus with hyperglycemia ([place]) 07/23/2019  . Insomnia 07/23/2019  . Hypotension 07/01/2019  . Acute dehydration 07/01/2019  . Personal history of multiple sclerosis ([place]) 12/02/2018  . Chronic fatigue 03/31/2017  . Diarrhea 03/31/2017  . Diabetes mellitus without complication ([place]) 11/91/4782  . Acute constipation 05/25/2015  . Internal hemorrhoids with complication 95/62/1308  . CAD, RCA PCI 2001, urgent Dx2 DES 04/24/14 04/24/2014  . Unstable angina with ST elelvation and NSVT while on treadmill 04/24/14   . History of basal cell carcinoma 03/07/2014  . GERD (gastroesophageal reflux disease) 03/07/2014  . Incomplete emptying of bladder 03/07/2014  . Obstructive sleep apnea 08/20/2009  . Dyslipidemia 11/09/2008  . Depression, major, recurrent ([place]) 07/06/2007  . Essential hypertension 07/06/2007  . Coronary artery disease involving native coronary artery of native heart without angina pectoris 07/06/2007    Past Surgical History:  Procedure Laterality Date  . BIOPSY  07/26/2019   Procedure: BIOPSY;  Surgeon: Gatha Mayer, MD;  Location: [place];  Service: Endoscopy;;  . CATARACT EXTRACTION, BILATERAL    . COLONOSCOPY N/A 07/26/2019   Procedure: COLONOSCOPY;  Surgeon: Carlean Purl,  Ofilia Neas, MD;  Location: [facility] ENDOSCOPY;  Service: Endoscopy;  Laterality: N/A;  . COLONOSCOPY    . CORONARY STENT PLACEMENT  2001    RCA  . LEFT HEART CATHETERIZATION WITH CORONARY ANGIOGRAM N/A 02/13/2012   Procedure: LEFT HEART CATHETERIZATION WITH CORONARY ANGIOGRAM;  Surgeon: [name], MD;  [facility] C[facility] LAB;  Service: Cardiovascular;  Laterality: N/A;  . LEFT HEART  CATHETERIZATION WITH CORONARY ANGIOGRAM N/A 04/24/2014   Procedure: LEFT HEART CATHETERIZATION WITH CORONARY ANGIOGRAM;  Surgeon: [name], MD;  [facility];  Service: Cardiovascular;  Laterality: N/A;  . TONSILLECTOMY         Family History  Problem Relation Age of Onset  . Cirrhosis Mother   . Alcohol abuse Mother   . Heart disease Father   . Hypertension Father   . Peripheral vascular disease Father   . Depression Sister   . Heart attack Neg Hx   . Stroke Neg Hx   . Stomach cancer Neg Hx   . Pancreatic cancer Neg Hx   . Colon cancer Neg Hx   . Esophageal cancer Neg Hx     Social History   Tobacco Use  . Smoking status: Former Smoker    Packs/day: 1.00    Years: 25.00    Pack years: 25.00    Types: Cigarettes    Quit date: 01/14/2000    Years since quitting: 19.5  . Smokeless tobacco: Never Used  Vaping Use  . Vaping Use: Never used  Substance Use Topics  . Alcohol use: Yes    Alcohol/week: 3.0 standard drinks    Types: 1 Cans of beer, 2 Shots of liquor per week  . Drug use: No    Home Medications Prior to Admission medications   Medication Sig Start Date End Date Taking? Authorizing Provider  carvedilol (COREG) 12.5 MG tablet Take 6.75 mg by mouth 2 (two) times daily with a meal.    [provider]  clopidogrel (PLAVIX) 75 MG tablet TAKE 1 TABLET BY MOUTH EVERY DAY 10/12/18   Josue Hector, MD  dicyclomine (BENTYL) 20 MG tablet Take 1 tablet (20 mg total) by mouth every 6 (six) hours as needed (abdominal pain). 07/28/19   Aline August, MD  Glucosamine HCl (GLUCOSAMINE PO) Take 2 tablets by mouth daily.    [provider]  nitroGLYCERIN (NITROSTAT) 0.4 MG SL tablet Place 1 tablet (0.4 mg total) under the tongue every 5 (five) minutes x 3 doses as needed for chest pain. 07/28/19   Aline August, MD  omeprazole (PRILOSEC) 20 MG capsule Take 1 capsule (20 mg total) by mouth daily before breakfast. 07/28/19   Aline August, MD    ondansetron (ZOFRAN) 4 MG tablet Take 1 tablet (4 mg total) by mouth every 8 (eight) hours as needed for nausea or vomiting. 07/01/19   [name], [name], MD  venlafaxine XR (EFFEXOR-XR) 75 MG 24 hr capsule TAKE 1 CAPSULE (75 MG TOTAL) BY MOUTH DAILY WITH BREAKFAST. 06/20/19   [name], [name], MD    Allergies    Patient has no known allergies.  Review of Systems   Review of Systems  Gastrointestinal: Positive for abdominal pain and diarrhea.  All other systems reviewed and are negative.   Physical Exam Updated Vital Signs BP (!) 86/53   Pulse (!) 118   Temp 98.1 F (36.7 C) (Oral)   Resp 22   Ht _0  (1.676 m)   Wt 74.3 kg   SpO2  98%   BMI 26.44 kg/m   Physical Exam Vitals and nursing note reviewed.  Constitutional:      General: He is not in acute distress.    Appearance: He is well-developed. He is not diaphoretic.  HENT:     Head: Normocephalic and atraumatic.  Cardiovascular:     Rate and Rhythm: Normal rate and regular rhythm.     Heart sounds: No murmur heard.  No friction rub.  Pulmonary:     Effort: Pulmonary effort is normal. No respiratory distress.     Breath sounds: Normal breath sounds. No wheezing or rales.  Abdominal:     General: Bowel sounds are normal. There is no distension.     Palpations: Abdomen is soft.     Tenderness: There is no abdominal tenderness. There is no right CVA tenderness or left CVA tenderness.  Musculoskeletal:        General: Normal range of motion.     Cervical back: Normal range of motion and neck supple.  Skin:    General: Skin is warm and dry.  Neurological:     Mental Status: He is alert and oriented to person, place, and time.     Coordination: Coordination normal.     ED Results / Procedures / Treatments   Labs (all labs ordered are listed, but only abnormal results are displayed) Labs Reviewed  COMPREHENSIVE METABOLIC PANEL - Abnormal; Notable for the following components:      Result Value   Potassium 2.6 (*)     Chloride 97 (*)    CO2 19 (*)    Glucose, Bld 154 (*)    BUN 35 (*)    Creatinine, Ser 2.34 (*)    Calcium 8.2 (*)    Total Protein 6.1 (*)    Albumin 2.7 (*)    GFR calc non Af Amer 27 (*)    GFR calc Af Amer 31 (*)    Anion gap 19 (*)    All other components within normal limits  CBC - Abnormal; Notable for the following components:   WBC 10.7 (*)    Platelets 462 (*)    All other components within normal limits  LACTIC ACID, PLASMA - Abnormal; Notable for the following components:   Lactic Acid, Venous 6.4 (*)    All other components within normal limits  CULTURE, BLOOD (ROUTINE X 2)  CULTURE, BLOOD (ROUTINE X 2)  LIPASE, BLOOD  URINALYSIS, ROUTINE W REFLEX MICROSCOPIC  PROTIME-INR  LACTIC ACID, PLASMA    EKG EKG Interpretation  Date/Time:  Wednesday August 10 2019 21:59:25 EDT Ventricular Rate:  103 PR Interval:    QRS Duration: 102 QT Interval:  382 QTC Calculation: 501 R Axis:   -33 Text Interpretation: Sinus tachycardia Left axis deviation Low voltage, precordial leads Prolonged QT interval Confirmed by Veryl Speak [phone]) on 08/10/2019 11:13:02 PM   Radiology No results found.  Procedures Procedures (including critical care time)  Medications Ordered in ED Medications - No data to display  ED Course  I have reviewed the triage vital signs and the nursing notes.  Pertinent labs & imaging results that were available during my care of the patient were reviewed by me and considered in my medical decision making (see chart for details).    MDM Rules/Calculators/A&P  Patient is a 74 year old male with recent admission for colonic obstruction.  He presents today with complaints of vomiting and diarrhea.  He reports no p.o. intake over the past several days.  He  feels very weak and reports a weight loss of 15 pounds over the past 3 weeks.  Patient arrives here with somewhat low blood pressure and mild tachycardia.  He is afebrile.  Laboratory studies show a  mild leukocytosis, but significant electrolyte disturbances.  His potassium is 2.6, creatinine has increased from 1-2.34, and lactate initially 6.2.  Blood cultures obtained and patient given Zosyn presumptively for intra-abdominal infection.  Patient then went to CT scan for pictures of his abdomen.  This reveals thickening within the sigmoid colon and what appears to be distal colonic obstruction.  Patient will require admission for fluid resuscitation, antibiotics and further GI work-up.  I have spoken with Dr. Darrick Meigs on who agrees to admit.  CRITICAL CARE Performed by: Veryl Speak Total critical care time: 45 minutes Critical care time was exclusive of separately billable procedures and treating other patients. Critical care was necessary to treat or prevent imminent or life-threatening deterioration. Critical care was time spent personally by me on the following activities: development of treatment plan with patient and/or surrogate as well as nursing, discussions with consultants, evaluation of patient's response to treatment, examination of patient, obtaining history from patient or surrogate, ordering and performing treatments and interventions, ordering and review of laboratory studies, ordering and review of radiographic studies, pulse oximetry and re-evaluation of patient's condition.   Final Clinical Impression(s) / ED Diagnoses Final diagnoses:  None    Rx / DC Orders ED Discharge Orders    None       Veryl Speak, MD 08/10/19 2318

## 2019-08-10 NOTE — ED Notes (Signed)
Patient transported to X-ray 

## 2019-08-10 NOTE — ED Notes (Signed)
Roy [name], son, [phone] would like an update when available

## 2019-08-11 ENCOUNTER — Encounter (HOSPITAL_COMMUNITY): Payer: Self-pay | Admitting: Internal Medicine

## 2019-08-11 DIAGNOSIS — E876 Hypokalemia: Secondary | ICD-10-CM

## 2019-08-11 DIAGNOSIS — N179 Acute kidney failure, unspecified: Secondary | ICD-10-CM

## 2019-08-11 DIAGNOSIS — R933 Abnormal findings on diagnostic imaging of other parts of digestive tract: Secondary | ICD-10-CM

## 2019-08-11 DIAGNOSIS — K529 Noninfective gastroenteritis and colitis, unspecified: Secondary | ICD-10-CM | POA: Diagnosis not present

## 2019-08-11 DIAGNOSIS — E872 Acidosis: Secondary | ICD-10-CM

## 2019-08-11 LAB — GASTROINTESTINAL PANEL BY PCR, STOOL (REPLACES STOOL CULTURE)

## 2019-08-11 LAB — COMPREHENSIVE METABOLIC PANEL
ALT: 23 U/L (ref 0–44)
AST: 13 U/L — ABNORMAL LOW (ref 15–41)
Albumin: 2 g/dL — ABNORMAL LOW (ref 3.5–5.0)
Alkaline Phosphatase: 58 U/L (ref 38–126)
Anion gap: 10 (ref 5–15)
BUN: 33 mg/dL — ABNORMAL HIGH (ref 8–23)
CO2: 20 mmol/L — ABNORMAL LOW (ref 22–32)
Calcium: 7 mg/dL — ABNORMAL LOW (ref 8.9–10.3)
Chloride: 106 mmol/L (ref 98–111)
Creatinine, Ser: 1.74 mg/dL — ABNORMAL HIGH (ref 0.61–1.24)
GFR calc Af Amer: 44 mL/min — ABNORMAL LOW (ref >60)
GFR calc non Af Amer: 38 mL/min — ABNORMAL LOW (ref >60)
Glucose, Bld: 131 mg/dL — ABNORMAL HIGH (ref 70–99)
Potassium: 2.3 mmol/L — CL (ref 3.5–5.1)
Sodium: 136 mmol/L (ref 135–145)
Total Bilirubin: 0.6 mg/dL (ref 0.3–1.2)
Total Protein: 4.6 g/dL — ABNORMAL LOW (ref 6.5–8.1)

## 2019-08-11 LAB — BASIC METABOLIC PANEL
Anion gap: 11 (ref 5–15)
BUN: 28 mg/dL — ABNORMAL HIGH (ref 8–23)
CO2: 18 mmol/L — ABNORMAL LOW (ref 22–32)
Calcium: 7.3 mg/dL — ABNORMAL LOW (ref 8.9–10.3)
Chloride: 109 mmol/L (ref 98–111)
Creatinine, Ser: 1.35 mg/dL — ABNORMAL HIGH (ref 0.61–1.24)
GFR calc Af Amer: 60 mL/min — ABNORMAL LOW (ref >60)
GFR calc non Af Amer: 52 mL/min — ABNORMAL LOW (ref >60)
Glucose, Bld: 121 mg/dL — ABNORMAL HIGH (ref 70–99)
Potassium: 2.7 mmol/L — CL (ref 3.5–5.1)
Sodium: 138 mmol/L (ref 135–145)

## 2019-08-11 LAB — LACTIC ACID, PLASMA: Lactic Acid, Venous: 1.1 mmol/L (ref 0.5–1.9)

## 2019-08-11 LAB — CBC
HCT: 32.4 % — ABNORMAL LOW (ref 39.0–52.0)
Hemoglobin: 11.2 g/dL — ABNORMAL LOW (ref 13.0–17.0)
MCH: 29.9 pg (ref 26.0–34.0)
MCHC: 34.6 g/dL (ref 30.0–36.0)
MCV: 86.4 fL (ref 80.0–100.0)
Platelets: 245 10*3/uL (ref 150–400)
RBC: 3.75 MIL/uL — ABNORMAL LOW (ref 4.22–5.81)
RDW: 13.8 % (ref 11.5–15.5)
WBC: 6.9 10*3/uL (ref 4.0–10.5)
nRBC: 0 % (ref 0.0–0.2)

## 2019-08-11 LAB — C DIFFICILE QUICK SCREEN W PCR REFLEX
C Diff antigen: NEGATIVE
C Diff interpretation: NOT DETECTED
C Diff toxin: NEGATIVE

## 2019-08-11 LAB — MAGNESIUM: Magnesium: 2.3 mg/dL (ref 1.7–2.4)

## 2019-08-11 LAB — SARS CORONAVIRUS 2 BY RT PCR (HOSPITAL ORDER, PERFORMED IN ~~LOC~~ HOSPITAL LAB): SARS Coronavirus 2: NEGATIVE

## 2019-08-11 LAB — C-REACTIVE PROTEIN: CRP: 10 mg/dL — ABNORMAL HIGH (ref <1.0)

## 2019-08-11 LAB — POTASSIUM: Potassium: 3.9 mmol/L (ref 3.5–5.1)

## 2019-08-11 MED ORDER — MAGNESIUM SULFATE 2 GM/50ML IV SOLN
2.0000 g | Freq: Once | INTRAVENOUS | Status: AC
  Administered 2019-08-11: 2 g via INTRAVENOUS
  Filled 2019-08-11: qty 50

## 2019-08-11 MED ORDER — MESALAMINE 1.2 G PO TBEC
4.8000 g | DELAYED_RELEASE_TABLET | Freq: Every day | ORAL | Status: DC
  Administered 2019-08-12 – 2019-08-15 (×4): 4.8 g via ORAL
  Filled 2019-08-11 (×5): qty 4

## 2019-08-11 MED ORDER — PIPERACILLIN-TAZOBACTAM 3.375 G IVPB
3.3750 g | Freq: Three times a day (TID) | INTRAVENOUS | Status: DC
  Administered 2019-08-11: 3.375 g via INTRAVENOUS
  Filled 2019-08-11: qty 50

## 2019-08-11 MED ORDER — POTASSIUM CHLORIDE 10 MEQ/100ML IV SOLN
10.0000 meq | Freq: Once | INTRAVENOUS | Status: AC
  Administered 2019-08-11: 10 meq via INTRAVENOUS
  Filled 2019-08-11: qty 100

## 2019-08-11 MED ORDER — POTASSIUM CHLORIDE 10 MEQ/100ML IV SOLN
10.0000 meq | INTRAVENOUS | Status: AC
  Administered 2019-08-11 (×5): 10 meq via INTRAVENOUS
  Filled 2019-08-11 (×6): qty 100

## 2019-08-11 MED ORDER — PANTOPRAZOLE SODIUM 40 MG PO TBEC
40.0000 mg | DELAYED_RELEASE_TABLET | Freq: Every day | ORAL | Status: DC
  Administered 2019-08-11 – 2019-08-15 (×5): 40 mg via ORAL
  Filled 2019-08-11 (×5): qty 1

## 2019-08-11 NOTE — ED Notes (Signed)
Lunch Tray Ordered @ 1008. 

## 2019-08-11 NOTE — ED Notes (Signed)
Urine collected at 1744 and sent to main lab.

## 2019-08-11 NOTE — Consult Note (Addendum)
[facility] Consult: 8:55 AM 08/11/2019  LOS: 1 day    Referring Provider: Dr Tawanna Solo  Primary Care Physician:  Jinny Sanders, MD Primary Gastroenterologist:  Dr. Lyndel Safe     Reason for Consultation: Persistent diarrhea, nausea, vomiting, abdominal pain.   HPI: Roy [name] is a 74 y.o. male.  PMH CAD, cardiac stents.  Chronic Plavix.  OSA.  Htn.    Admission 7/10 - 7/15  w obstructing colon lesion at sigmoid per 07/23/19 CTAP .  07/26/19 colonoscopy showed congested mucosa at prox sigmoid but no mass or stricture.  Path: benign mucosa with lamina propria edema, no inflammation and no evidence of microscopic colitis or IBD.   fup CTAP 7/13: improved obstructive pattern, interval pancoliltis proximal to area of sigmoid obstruction.  No record of abx during admission and none at discharge.  Diarrhea, pain improved but recurred after 7/15 discharge. Transient LFT elevation w negative viral serologies (normalized by 7/28). ANA negative.  CEA 3.8.  Plavix resumed 7/16.   There was ? If pt had had a high mpaction.  4 d after 7/16 GI ROV w NP, Dr Lyndel Safe ordered CT angio to r/o mesenteric ischemia.  7/19 WBCs 12.4, so course of Doxy added along w prn imodium added by Dr Lyndel Safe on 7/21.  CTA of 7/21: Diffuse inflammatory patterned colitis.  Improvement in sigmoid mass w associated luminal narrowing.  Obstructive pattern resolved. 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    Per my chart convo 7/27, Dr Loletha Carrow rec 1/2 capful Miralax, 1 tablespoon benfiber daily based on suspicion for fecal impaction w overflow incontinence.   Pt returned to ED yest PM w anorexia, abd pain, emesis, diarrhea despite Doxy.  Up to 15 light brown, watery stools in 24 hours, persistent diffuse abdominal pain sometimes radiating into the back.  Bilious brown  emesis, no hematemesis.  Essentially the patient symptoms have persisted since he was admitted in early July.  Denies fevers, chills, sweats.  Wt loss ~ 22 # per pt.    Has had stool pathogen PCR on 6/22, 7/20.  Yersinia detected on 7/20. C diff negative today.   08/10/19 CTAP w/o contrast:  Persistent wall thickening within sigmoid with more proximal fluid retention in the colon and distal small bowel dilatation consistent with a partial colonic obstruction. These changes are likely related to diverticulitis as recent colonoscopy shows no definitive mass lesion. No abscess or perforation is noted.  WBCs 10.7 >> 6.9.  Day 2 Zosyn.   K 2.3.  AKI: BUN/Creat 35/2/3 >> 33/1.7    Past Medical History:  Diagnosis Date  . Basal cell carcinoma    Bilateral Legs  . CAD (coronary artery disease)    a.  stent to the RCA in 2001;  b.  Negative Myoview in January 2012;  c.  LHC 4/16:  dLM 20, small D1 50-70, D2 sub-total, oLCx 50-70, RCA stent ok, dPLB 90, EF 35-40% >> DES to D2  . Depression   . History of echocardiogram    a.  Echo 4/16: Mild LVH, EF  09-38%, grade 1 diastolic dysfunction, normal wall motion, MAC  . HLD (hyperlipidemia)   . HTN (hypertension)   . OSA (obstructive sleep apnea)   . Vertigo     Past Surgical History:  Procedure Laterality Date  . BIOPSY  07/26/2019   Procedure: BIOPSY;  Surgeon: Gatha Mayer, MD;  Location: [place];  Service: Endoscopy;;  . CATARACT EXTRACTION, BILATERAL    . COLONOSCOPY N/A 07/26/2019   Procedure: COLONOSCOPY;  Surgeon: Gatha Mayer, MD;  Location: [facility] ENDOSCOPY;  Service: Endoscopy;  Laterality: N/A;  . COLONOSCOPY    . CORONARY STENT PLACEMENT  2001    RCA  . LEFT HEART CATHETERIZATION WITH CORONARY ANGIOGRAM N/A 02/13/2012   Procedure: LEFT HEART CATHETERIZATION WITH CORONARY ANGIOGRAM;  Surgeon: [name], MD;  Location: [facility] CATH LAB;  Service: Cardiovascular;  Laterality: N/A;  . LEFT HEART CATHETERIZATION WITH CORONARY  ANGIOGRAM N/A 04/24/2014   Procedure: LEFT HEART CATHETERIZATION WITH CORONARY ANGIOGRAM;  Surgeon: [name], MD;  Location[facility] C[facility] LAB;  Service: Cardiovascular;  Laterality: N/A;  . TONSILLECTOMY      Prior to Admission medications   Medication Sig Start Date End Date Taking? Authorizing Provider  clopidogrel (PLAVIX) 75 MG tablet TAKE 1 TABLET BY MOUTH EVERY DAY Patient taking differently: Take 75 mg by mouth daily.  10/12/18  Yes Josue Hector, MD  dicyclomine (BENTYL) 20 MG tablet Take 1 tablet (20 mg total) by mouth every 6 (six) hours as needed (abdominal pain). 07/28/19  Yes Aline August, MD  Glucosamine HCl (GLUCOSAMINE PO) Take 2 tablets by mouth daily.   Yes [provider]  Magnesium 200 MG TABS Take 200 mg by mouth daily.   Yes [provider]  nitroGLYCERIN (NITROSTAT) 0.4 MG SL tablet Place 1 tablet (0.4 mg total) under the tongue every 5 (five) minutes x 3 doses as needed for chest pain. 07/28/19  Yes Aline August, MD  omeprazole (PRILOSEC) 20 MG capsule Take 1 capsule (20 mg total) by mouth daily before breakfast. 07/28/19  Yes Aline August, MD  ondansetron (ZOFRAN) 4 MG tablet Take 1 tablet (4 mg total) by mouth every 8 (eight) hours as needed for nausea or vomiting. 07/01/19  Yes [name], [name], MD  venlafaxine XR (EFFEXOR-XR) 75 MG 24 hr capsule TAKE 1 CAPSULE (75 MG TOTAL) BY MOUTH DAILY WITH BREAKFAST. 06/20/19  Yes [name], [name], MD  carvedilol (COREG) 12.5 MG tablet Take 6.25 mg by mouth 2 (two) times daily with a meal.     [provider]    Scheduled Meds:  Infusions: . 0.9 % NaCl with KCl 20 mEq / L 100 mL/hr at 08/11/19 0809  . piperacillin-tazobactam (ZOSYN)  IV 3.375 g (08/11/19 0559)  . potassium chloride 10 mEq (08/11/19 0810)   PRN Meds: acetaminophen **OR** acetaminophen, morphine injection, ondansetron **OR** ondansetron (ZOFRAN) IV   Allergies as of 08/10/2019  . (No Known Allergies)    Family History  Problem  Relation Age of Onset  . Cirrhosis Mother   . Alcohol abuse Mother   . Heart disease Father   . Hypertension Father   . Peripheral vascular disease Father   . Depression Sister   . Heart attack Neg Hx   . Stroke Neg Hx   . Stomach cancer Neg Hx   . Pancreatic cancer Neg Hx   . Colon cancer Neg Hx   . Esophageal cancer Neg Hx     Social History   Socioeconomic History  .  Marital status: Divorced    Spouse name: Not on file  . Number of children: Not on file  . Years of education: Not on file  . Highest education level: Not on file  Occupational History  . Not on file  Tobacco Use  . Smoking status: Former Smoker    Packs/day: 1.00    Years: 25.00    Pack years: 25.00    Types: Cigarettes    Quit date: 01/14/2000    Years since quitting: 19.5  . Smokeless tobacco: Never Used  Vaping Use  . Vaping Use: Never used  Substance and Sexual Activity  . Alcohol use: Yes    Alcohol/week: 3.0 standard drinks    Types: 1 Cans of beer, 2 Shots of liquor per week  . Drug use: No  . Sexual activity: Not Currently  Other Topics Concern  . 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   Social Determinants of Health   Financial Resource Strain:   . Difficulty of Paying Living Expenses:   Food Insecurity:   . Worried About Charity fundraiser in the Last Year:   . Arboriculturist in the Last Year:   Transportation Needs:   . Film/video editor (Medical):   Marland Kitchen Lack of Transportation (Non-Medical):   Physical Activity:   . Days of Exercise per Week:   . Minutes of Exercise per Session:   Stress:   . Feeling of Stress :   Social Connections:   . Frequency of Communication with Friends and Family:   . Frequency of Social Gatherings with Friends and Family:   . Attends Religious Services:   . Active Member of Clubs or Organizations:   . Attends Archivist Meetings:    Marland Kitchen Marital Status:   Intimate Partner Violence:   . Fear of Current or Ex-Partner:   . Emotionally Abused:   Marland Kitchen Physically Abused:   . Sexually Abused:     REVIEW OF SYSTEMS: Constitutional: Feels tired and weak ENT:  No nose bleeds Pulm: No shortness of breath CV:  No palpitations, no LE edema.  No angina GU:  No hematuria, no frequency GI: See HPI Heme: No unusual or excessive bleeding or bruising Transfusions: None Neuro:  No headaches, no peripheral tingling or numbness.  No syncope, no seizures Derm:  No itching, no rash or sores.  Endocrine:  No sweats or chills.  No polyuria or dysuria Immunization: Not queried Travel:  None beyond local counties in last few months.    PHYSICAL EXAM: Vital signs in last 24 hours: Vitals:   08/11/19 0733 08/11/19 0830  BP: 117/76 126/80  Pulse: 90 91  Resp: 16 16  Temp:    SpO2: 96% 94%   Wt Readings from Last 3 Encounters:  08/10/19 74.3 kg  08/02/19 73.7 kg  07/29/19 74.2 kg    General: Pleasant, comfortable, alert.  Does not look ill or toxic. Head: No facial asymmetry, slight facial swelling and rubor Eyes: No scleral icterus.  No conjunctival pallor. Ears: Hard of hearing, bilateral hearing aids. Nose: No congestion or discharge Mouth: Oral mucosa moist, pink, clear.  Tongue midline. Neck: No JVD, no masses, no thyromegaly. Lungs: Clear bilaterally.  No labored breathing. Heart: RRR.  No MRG.  S1, S2 present Abdomen: Soft, nondistended, hypoactive bowel sounds but no tinkling or tympany attics or high-pitched  sounds.  Mild diffuse tenderness possibly a little more pronounced on the left abdomen.  No guarding or rebound.  No HSM, masses, bruits, hernias.   Rectal: Deferred.  Flexi-Seal in place with bag and tubing containing grayish/light brown/nonbloody opaque watery stool Musc/Skeltl: No joint redness, swelling or gross deformities. Extremities: No CCE. Neurologic: Hard of hearing, hearing aids in place bilaterally.   No tremor, no limb weakness.  Oriented x3. Skin: Facial rubor, no suspicious lesions Tattoos: None observed Nodes: No cervical adenopathy Psych: Cooperative, pleasant, calm.  Fluid speech.  Intake/Output from previous day: 07/28 0701 - 07/29 0700 In: 2050 [IV Piggyback:2050] Out: -  Intake/Output this shift: No intake/output data recorded.  LAB RESULTS: Recent Labs    08/10/19 1936 08/11/19 0208  WBC 10.7* 6.9  HGB 14.7 11.2*  HCT 43.9 32.4*  PLT 462* 245   BMET Lab Results  Component Value Date   NA 136 08/11/2019   NA 135 08/10/2019   NA 133 (L) 08/01/2019   K 2.3 (LL) 08/11/2019   K 2.6 (LL) 08/10/2019   K 3.7 08/01/2019   CL 106 08/11/2019   CL 97 (L) 08/10/2019   CL 110 08/01/2019   CO2 20 (L) 08/11/2019   CO2 19 (L) 08/10/2019   CO2 27 08/01/2019   GLUCOSE 131 (H) 08/11/2019   GLUCOSE 154 (H) 08/10/2019   GLUCOSE 106 (H) 08/01/2019   BUN 33 (H) 08/11/2019   BUN 35 (H) 08/10/2019   BUN 10 08/01/2019   CREATININE 1.74 (H) 08/11/2019   CREATININE 2.34 (H) 08/10/2019   CREATININE 0.99 08/01/2019   CALCIUM 7.0 (L) 08/11/2019   CALCIUM 8.2 (L) 08/10/2019   CALCIUM 9.0 08/01/2019   LFT Recent Labs    08/10/19 1936 08/11/19 0208  PROT 6.1* 4.6*  ALBUMIN 2.7* 2.0*  AST 21 13*  ALT 30 23  ALKPHOS 86 58  BILITOT 0.9 0.6   PT/INR Lab Results  Component Value Date   INR 1.2 08/10/2019   INR 1.1 07/24/2019   INR 1.0 02/10/2012   Hepatitis Panel No results for input(s): HEPBSAG, HCVAB, HEPAIGM, HEPBIGM in the last 72 hours. C-Diff No components found for: CDIFF Lipase     Component Value Date/Time   LIPASE 18 08/10/2019 1936    Drugs of Abuse  No results found for: LABOPIA, COCAINSCRNUR, LABBENZ, AMPHETMU, THCU, LABBARB   RADIOLOGY STUDIES: CT ABDOMEN PELVIS WO CONTRAST  Result Date: 08/10/2019 CLINICAL DATA:  Abdominal pain and diarrhea for 1 week following colonoscopy EXAM: CT ABDOMEN AND PELVIS WITHOUT CONTRAST TECHNIQUE: Multidetector  CT imaging of the abdomen and pelvis was performed following the standard protocol without IV contrast. COMPARISON:  08/03/2019 FINDINGS: Lower chest: Calcified granuloma is noted in the left laterally. Mild scarring is noted in the right lung base stable from the prior exam. Focal herniation of fat is noted to the posteromedial left hemidiaphragm. Hepatobiliary: Gallbladder is well distended. The liver is within normal limits. Pancreas: Unremarkable. No pancreatic ductal dilatation or surrounding inflammatory changes. Spleen: Multiple calcified granulomas are noted. No other focal abnormality is seen. Adrenals/Urinary Tract: Adrenal glands are within normal limits. Kidneys are well visualize without renal calculi or obstructive change. The bladder is within normal limits. Stomach/Bowel: Colon demonstrates scattered diverticular change as well as wall thickening in the sigmoid. Proximal to this there is significant dilatation of the colon with fluid as well as some mild dilatation of distal small bowel. These changes are consistent with a partial colonic obstruction related to the wall thickening  seen in the sigmoid. This is again somewhat suspicious for underlying mass although recent colonoscopy shows no definitive mass. Stomach is within normal limits. Vascular/Lymphatic: Aortic atherosclerosis. No enlarged abdominal or pelvic lymph nodes. Reproductive: Prostate is unremarkable. Other: No abdominal wall hernia or abnormality. No abdominopelvic ascites. Musculoskeletal: No acute or significant osseous findings. IMPRESSION: Persistent wall thickening within the sigmoid with more proximal fluid retention in the colon and distal small bowel dilatation consistent with a partial colonic obstruction. These changes are likely related to diverticulitis as recent colonoscopy shows no definitive mass lesion. No abscess or perforation is noted. Prior granulomatous disease. No other focal abnormality is noted. Electronically  Signed   By: Inez Catalina M.D.   On: 08/10/2019 22:34   DG Chest 2 View  Result Date: 08/10/2019 CLINICAL DATA:  74 year old male with sepsis. EXAM: CHEST - 2 VIEW COMPARISON:  Chest radiograph dated 07/23/2019. FINDINGS: Patchy area of density in the lingula may represent atelectasis. Infiltrate is not excluded clinical correlation is recommended. There is no pleural effusion or pneumothorax. Left lung base calcified granuloma. The cardiac silhouette is within limits. No acute osseous pathology. IMPRESSION: Lingular atelectasis versus infiltrate. Electronically Signed   By: Anner Crete M.D.   On: 08/10/2019 21:53     IMPRESSION:   *   Confounding picture of pt w persistent (going on week 5) abd pain, non-bloody diarrhea and N/V.   Initially thought to have sigmoid mass w element of upstream obstruction.  Colonoscopy showed congested appearance to sigmoid mucosa but no mass, path benign/unrevealing.  Subsequent CTAPs, CT angio (4 studies in total since onset sxs first week July) still show an element of colitis and, now, possible diverticulitis.  Stool path panel of 7/20 positive for Yersinia.  Recent Rx of Doxy has not helped and abx .   Day 2 Zosyn Stool PCR panel pndg.    *    AKI.  Improving.  Normal renal labs on 7/19.  Labelled as CKD 3 in past.    *   CAD.  Cardiac stents.  Chronic Plavix, last dose 7/28, on hold.     PLAN:     *   Per Dr Bryan Lemma.   ? Need to repeat a flex sig w sdditonal biopsies??  OK to have clears as tolerated.     Azucena Freed  08/11/2019, 8:55 AM Phone [phone]

## 2019-08-11 NOTE — ED Notes (Signed)
[phone] courtny Marlar son please call for an update

## 2019-08-11 NOTE — Progress Notes (Signed)
Pharmacy Antibiotic Note  Roy [name] is a 74 y.o. male admitted on 08/10/2019 with intra-abominal infection.  Pharmacy has been consulted for Zosyn dosing. WBC 10.7. Noted renal dysfunction.   Plan: Zosyn 3.375G IV q8h to be infused over 4 hours Trend WBC, temp, renal function F/U infectious work-up  Height: 5\' 6"  (167.6 cm) Weight: 74.3 kg (163 lb 12.8 oz) IBW/kg (Calculated) : 63.8  Temp (24hrs), Avg:98.1 F (36.7 C), Min:98.1 F (36.7 C), Max:98.1 F (36.7 C)  Recent Labs  Lab 08/10/19 1936 08/10/19 2123  WBC 10.7*  --   CREATININE 2.34*  --   LATICACIDVEN 6.4* 6.2*    Estimated Creatinine Clearance: 25.4 mL/min (A) (by C-G formula based on SCr of 2.34 mg/dL (H)).    No Known Allergies  Narda Bonds, PharmD, BCPS Clinical Pharmacist Phone: [phone]

## 2019-08-11 NOTE — Progress Notes (Addendum)
PROGRESS NOTE    Roy [name]  WJX:[id number] DOB: 1945/11/10 DOA: 08/10/2019 PCP: Jinny Sanders, MD   Brief Narrative:  Patient is a 74 year old male with history of coronary artery  disease, prediabetes, depression who was recently hospitalized for abdominal pain and was found to have a bowel obstruction requiring NG tube.  Underwent colonoscopy which did not reveal any mass but showed diverticulosis.  He was discharged after he improved.  At that time he was found to have Yersinia in his GI pathogen panel and was treated with short course of doxycycline.  After going home, he again developed abdominal pain, started having loose stools, vomiting, feeling fatigue, loss of appetite.  He then presented back to the emergency department.  On presentation, he was found to have severe hypokalemia, lactic acidosis, AKI.  CT abdomen/pelvis showed colonic wall thickening concerning for partial colonic obstruction and diverticulitis.  Patient started on IV antibiotics, potassium supplementation, IV fluids.  GI consulted  Assessment & Plan:   Principal Problem:   Acute colitis Active Problems:   Coronary artery disease involving native coronary artery of native heart without angina pectoris   Hypotension   Acute renal failure (HCC)   Abnormal CT scan, colon   Lactic acid acidosis   Hypokalemia   Acute colitis/diverticulitis: Presented with abdomen pain, nausea, vomiting, lactic acidosis, AKI, hypokalemia.  Started on IV fluids, Zosyn.  GI pathogen panel, C. Difficile sent but came back negative.  He is having profuse diarrhea.  Nausea and vomiting have improved. GI consulted Plan is to check CRP, fecal calprotectin.  GI planning for repeat flexible sigmoidoscopy if above tests are negative. His symptoms have somewhat improved.  His abdomen was nontender, has good bowel sounds.  Nausea is improved.  No obstructive changes in the CT scan  AKI: Improved with IV fluids  Lactic acidosis:  Resolved  Severe hypokalemia: Being supplemented with potassium.  Supplemented with magnesium   History of coronary artery disease: Cardiac medications on hold.  Stable from cardiac standpoint.  Abnormal chest x-ray: Chest x-ray showed lingular atelectasis versus infiltrate.  He does not have any respiratory symptoms at present.  Urine retention: Currently voiding fine.  Had an episode of urine retention on 08/12/2019.  Started on Flomax        DVT prophylaxis:SCD Code Status: Full Family Communication: None present at the bedside Status is: Inpatient  Remains inpatient appropriate because:IV treatments appropriate due to intensity of illness or inability to take PO   Dispo: The patient is from: Home              Anticipated d/c is to: Home              Anticipated d/c date is: 2 days              Patient currently is not medically stable to d/c.     Consultants: GI  Procedures:None  Antimicrobials:  Anti-infectives (From admission, onward)   Start     Dose/Rate Route Frequency Ordered Stop   08/11/19 0600  piperacillin-tazobactam (ZOSYN) IVPB 3.375 g  Status:  Discontinued        3.375 g 12.5 mL/hr over 240 Minutes Intravenous Every 8 hours 08/11/19 0023 08/11/19 1129   08/10/19 2145  piperacillin-tazobactam (ZOSYN) IVPB 3.375 g        3.375 g 12.5 mL/hr over 240 Minutes Intravenous  Once 08/10/19 2140 08/11/19 0156      Subjective:  Patient seen and examined at the bedside this  morning.  Hemodynamically stable.  Feeling better.  Still having profuse diarrhea but his abdominal pain, nausea vomiting have improved. Objective: Vitals:   08/11/19 0400 08/11/19 0733 08/11/19 0830 08/11/19 1215  BP: 114/70 117/76 126/80 122/83  Pulse: 100 90 91 85  Resp: 18 16 16 19   Temp:      TempSrc:      SpO2: 95% 96% 94% 94%  Weight:      Height:        Intake/Output Summary (Last 24 hours) at 08/11/2019 1507 Last data filed at 08/10/2019 2201 Gross per 24 hour  Intake  2050 ml  Output --  Net 2050 ml   Filed Weights   08/10/19 1924  Weight: 74.3 kg    Examination:  General exam: Appears calm and comfortable ,Not in distress,average built HEENT:PERRL,Oral mucosa moist, Ear/Nose normal on gross exam Respiratory system: Bilateral equal air entry, normal vesicular breath sounds, no wheezes or crackles  Cardiovascular system: S1 & S2 heard, RRR. No JVD, murmurs, rubs, gallops or clicks. No pedal edema. Gastrointestinal system: Abdomen is somewhat distended, nontender, hyperactive bowel sounds Central nervous system: Alert and oriented. No focal neurological deficits. Extremities: No edema, no clubbing ,no cyanosis, distal peripheral pulses palpable. Skin: No rashes, lesions or ulcers,no icterus ,no pallor  Data Reviewed: I have personally reviewed following labs and imaging studies  CBC: Recent Labs  Lab 08/10/19 1936 08/11/19 0208  WBC 10.7* 6.9  HGB 14.7 11.2*  HCT 43.9 32.4*  MCV 90.5 86.4  PLT 462* 235   Basic Metabolic Panel: Recent Labs  Lab 08/10/19 1936 08/10/19 2321 08/11/19 0208 08/11/19 1044  NA 135  --  136 138  K 2.6*  --  2.3* 2.7*  CL 97*  --  106 109  CO2 19*  --  20* 18*  GLUCOSE 154*  --  131* 121*  BUN 35*  --  33* 28*  CREATININE 2.34*  --  1.74* 1.35*  CALCIUM 8.2*  --  7.0* 7.3*  MG  --  1.2*  --  2.3   GFR: Estimated Creatinine Clearance: 44 mL/min (A) (by C-G formula based on SCr of 1.35 mg/dL (H)). Liver Function Tests: Recent Labs  Lab 08/10/19 1936 08/11/19 0208  AST 21 13*  ALT 30 23  ALKPHOS 86 58  BILITOT 0.9 0.6  PROT 6.1* 4.6*  ALBUMIN 2.7* 2.0*   Recent Labs  Lab 08/10/19 1936  LIPASE 18   No results for input(s): AMMONIA in the last 168 hours. Coagulation Profile: Recent Labs  Lab 08/10/19 2317  INR 1.2   Cardiac Enzymes: No results for input(s): CKTOTAL, CKMB, CKMBINDEX, TROPONINI in the last 168 hours. BNP (last 3 results) No results for input(s): PROBNP in the last 8760  hours. HbA1C: No results for input(s): HGBA1C in the last 72 hours. CBG: No results for input(s): GLUCAP in the last 168 hours. Lipid Profile: No results for input(s): CHOL, HDL, LDLCALC, TRIG, CHOLHDL, LDLDIRECT in the last 72 hours. Thyroid Function Tests: No results for input(s): TSH, T4TOTAL, FREET4, T3FREE, THYROIDAB in the last 72 hours. Anemia Panel: No results for input(s): VITAMINB12, FOLATE, FERRITIN, TIBC, IRON, RETICCTPCT in the last 72 hours. Sepsis Labs: Recent Labs  Lab 08/10/19 1936 08/10/19 2123 08/11/19 0208  LATICACIDVEN 6.4* 6.2* 1.1    Recent Results (from the past 240 hour(s))  Blood culture (routine single)     Status: None   Collection Time: 08/03/19  9:14 AM   Specimen: Blood   BLD  Result Value Ref Range Status   BLOOD CULTURE, ROUTINE Final report  Final   Organism ID, Bacteria Comment  Final    Comment: No aerobic or anaerobic growth in five days.  Blood culture (routine single)     Status: None   Collection Time: 08/03/19  9:14 AM   Specimen: Blood   BLD  Result Value Ref Range Status   BLOOD CULTURE, ROUTINE Final report  Final   Organism ID, Bacteria Comment  Final    Comment: No aerobic or anaerobic growth in five days.  Culture, blood (Routine x 2)     Status: None (Preliminary result)   Collection Time: 08/10/19  9:15 PM   Specimen: BLOOD  Result Value Ref Range Status   Specimen Description BLOOD RIGHT ARM  Final   Special Requests   Final    BOTTLES DRAWN AEROBIC AND ANAEROBIC Blood Culture results may not be optimal due to an inadequate volume of blood received in culture bottles   Culture   Final    NO GROWTH < 24 HOURS Performed at [facility], [address], [place], [place] [address]    Report Status PENDING  Incomplete  Culture, blood (Routine x 2)     Status: None (Preliminary result)   Collection Time: 08/10/19  9:15 PM   Specimen: BLOOD  Result Value Ref Range Status   Specimen Description BLOOD LEFT FOREARM   Final   Special Requests   Final    BOTTLES DRAWN AEROBIC AND ANAEROBIC Blood Culture results may not be optimal due to an inadequate volume of blood received in culture bottles   Culture   Final    NO GROWTH < 24 HOURS Performed at [facility], [place] [address] [place], [place] [address]    Report Status PENDING  Incomplete  SARS Coronavirus 2 by RT PCR (hospital order, performed in [facility] hospital lab) Nasopharyngeal Nasopharyngeal Swab     Status: None   Collection Time: 08/11/19 12:41 AM   Specimen: Nasopharyngeal Swab  Result Value Ref Range Status   SARS Coronavirus 2 NEGATIVE NEGATIVE Final    Comment: (NOTE) SARS-CoV-2 target nucleic acids are NOT DETECTED.  The SARS-CoV-2 RNA is generally detectable in upper and lower respiratory specimens during the acute phase of infection. The lowest concentration of SARS-CoV-2 viral copies this assay can detect is 250 copies / mL. A negative result does not preclude SARS-CoV-2 infection and should not be used as the sole basis for treatment or other patient management decisions.  A negative result may occur with improper specimen collection / handling, submission of specimen other than nasopharyngeal swab, presence of viral mutation(s) within the areas targeted by this assay, and inadequate number of viral copies (<250 copies / mL). A negative result must be combined with clinical observations, patient history, and epidemiological information.  Fact Sheet for Patients:   StrictlyIdeas.no  Fact Sheet for Healthcare Providers: BankingDealers.co.za  This test is not yet approved or  cleared by the [place] FDA and has been authorized for detection and/or diagnosis of SARS-CoV-2 by FDA under an Emergency Use Authorization (EUA).  This EUA will remain in effect (meaning this test can be used) for the duration of the COVID-19 declaration under Section 564(b)(1) of the Act, 21  U.S.C. section 360bbb-3(b)(1), unless the authorization is terminated or revoked sooner.  Performed at [facility], [place] [address], [place], [place] [address]   Gastrointestinal Panel by PCR , Stool  Status: None   Collection Time: 08/11/19  3:53 AM   Specimen: Stool  Result Value Ref Range Status   Campylobacter species NOT DETECTED NOT DETECTED Final   Plesimonas shigelloides NOT DETECTED NOT DETECTED Final   Salmonella species NOT DETECTED NOT DETECTED Final   Yersinia enterocolitica NOT DETECTED NOT DETECTED Final   Vibrio species NOT DETECTED NOT DETECTED Final   Vibrio cholerae NOT DETECTED NOT DETECTED Final   Enteroaggregative E coli (EAEC) NOT DETECTED NOT DETECTED Final   Enteropathogenic E coli (EPEC) NOT DETECTED NOT DETECTED Final   Enterotoxigenic E coli (ETEC) NOT DETECTED NOT DETECTED Final   Shiga like toxin producing E coli (STEC) NOT DETECTED NOT DETECTED Final   Shigella/Enteroinvasive E coli (EIEC) NOT DETECTED NOT DETECTED Final   Cryptosporidium NOT DETECTED NOT DETECTED Final   Cyclospora cayetanensis NOT DETECTED NOT DETECTED Final   Entamoeba histolytica NOT DETECTED NOT DETECTED Final   Giardia lamblia NOT DETECTED NOT DETECTED Final   Adenovirus F40/41 NOT DETECTED NOT DETECTED Final   Astrovirus NOT DETECTED NOT DETECTED Final   Norovirus GI/GII NOT DETECTED NOT DETECTED Final   Rotavirus A NOT DETECTED NOT DETECTED Final   Sapovirus (I, II, IV, and V) NOT DETECTED NOT DETECTED Final    Comment: Performed at [place] [facility], [address] [place], [place] [address]  C Difficile Quick Screen w PCR reflex     Status: None   Collection Time: 08/11/19  3:53 AM   Specimen: Stool  Result Value Ref Range Status   C Diff antigen NEGATIVE NEGATIVE Final   C Diff toxin NEGATIVE NEGATIVE Final   C Diff interpretation No C. difficile detected.  Final    Comment: Performed at [facility], [place] [address] [place], [place] [address]          Radiology Studies: CT ABDOMEN PELVIS WO CONTRAST  Result Date: 08/10/2019 CLINICAL DATA:  Abdominal pain and diarrhea for 1 week following colonoscopy EXAM: CT ABDOMEN AND PELVIS WITHOUT CONTRAST TECHNIQUE: Multidetector CT imaging of the abdomen and pelvis was performed following the standard protocol without IV contrast. COMPARISON:  08/03/2019 FINDINGS: Lower chest: Calcified granuloma is noted in the left laterally. Mild scarring is noted in the right lung base stable from the prior exam. Focal herniation of fat is noted to the posteromedial left hemidiaphragm. Hepatobiliary: Gallbladder is well distended. The liver is within normal limits. Pancreas: Unremarkable. No pancreatic ductal dilatation or surrounding inflammatory changes. Spleen: Multiple calcified granulomas are noted. No other focal abnormality is seen. Adrenals/Urinary Tract: Adrenal glands are within normal limits. Kidneys are well visualize without renal calculi or obstructive change. The bladder is within normal limits. Stomach/Bowel: Colon demonstrates scattered diverticular change as well as wall thickening in the sigmoid. Proximal to this there is significant dilatation of the colon with fluid as well as some mild dilatation of distal small bowel. These changes are consistent with a partial colonic obstruction related to the wall thickening seen in the sigmoid. This is again somewhat suspicious for underlying mass although recent colonoscopy shows no definitive mass. Stomach is within normal limits. Vascular/Lymphatic: Aortic atherosclerosis. No enlarged abdominal or pelvic lymph nodes. Reproductive: Prostate is unremarkable. Other: No abdominal wall hernia or abnormality. No abdominopelvic ascites. Musculoskeletal: No acute or significant osseous findings. IMPRESSION: Persistent wall thickening within the sigmoid with more proximal fluid retention in the colon and distal small bowel dilatation consistent with a partial colonic  obstruction. These changes are likely related to diverticulitis as  recent colonoscopy shows no definitive mass lesion. No abscess or perforation is noted. Prior granulomatous disease. No other focal abnormality is noted. Electronically Signed   By: Inez Catalina M.D.   On: 08/10/2019 22:34   DG Chest 2 View  Result Date: 08/10/2019 CLINICAL DATA:  74 year old male with sepsis. EXAM: CHEST - 2 VIEW COMPARISON:  Chest radiograph dated 07/23/2019. FINDINGS: Patchy area of density in the lingula may represent atelectasis. Infiltrate is not excluded clinical correlation is recommended. There is no pleural effusion or pneumothorax. Left lung base calcified granuloma. The cardiac silhouette is within limits. No acute osseous pathology. IMPRESSION: Lingular atelectasis versus infiltrate. Electronically Signed   By: Anner Crete M.D.   On: 08/10/2019 21:53        Scheduled Meds: . [START ON 08/12/2019] mesalamine  4.8 g Oral Q breakfast  . pantoprazole  40 mg Oral Q0600   Continuous Infusions: . 0.9 % NaCl with KCl 20 mEq / L 100 mL/hr at 08/11/19 0809     LOS: 1 day    Time spent:35 mins. More than 50% of that time was spent in counseling and/or coordination of care.      Shelly Coss, MD [facility] P7/29/2021, 3:07 PM

## 2019-08-12 DIAGNOSIS — R197 Diarrhea, unspecified: Secondary | ICD-10-CM

## 2019-08-12 LAB — BASIC METABOLIC PANEL
Anion gap: 9 (ref 5–15)
BUN: 17 mg/dL (ref 8–23)
CO2: 19 mmol/L — ABNORMAL LOW (ref 22–32)
Calcium: 7.4 mg/dL — ABNORMAL LOW (ref 8.9–10.3)
Chloride: 110 mmol/L (ref 98–111)
Creatinine, Ser: 0.95 mg/dL (ref 0.61–1.24)
GFR calc Af Amer: >60 mL/min (ref >60)
GFR calc non Af Amer: >60 mL/min (ref >60)
Glucose, Bld: 93 mg/dL (ref 70–99)
Potassium: 2.4 mmol/L — CL (ref 3.5–5.1)
Sodium: 138 mmol/L (ref 135–145)

## 2019-08-12 LAB — URINALYSIS, ROUTINE W REFLEX MICROSCOPIC
Bilirubin Urine: NEGATIVE
Glucose, UA: NEGATIVE mg/dL
Hgb urine dipstick: NEGATIVE
Ketones, ur: 5 mg/dL — AB
Leukocytes,Ua: NEGATIVE
Nitrite: NEGATIVE
Protein, ur: NEGATIVE mg/dL
Specific Gravity, Urine: 1.017 (ref 1.005–1.030)
pH: 5 (ref 5.0–8.0)

## 2019-08-12 LAB — POTASSIUM: Potassium: 3.1 mmol/L — ABNORMAL LOW (ref 3.5–5.1)

## 2019-08-12 LAB — URINE CULTURE: Culture: NO GROWTH

## 2019-08-12 LAB — MRSA PCR SCREENING: MRSA by PCR: NEGATIVE

## 2019-08-12 MED ORDER — DIPHENHYDRAMINE HCL 25 MG PO CAPS
25.0000 mg | ORAL_CAPSULE | Freq: Once | ORAL | Status: AC
  Administered 2019-08-12: 25 mg via ORAL
  Filled 2019-08-12: qty 1

## 2019-08-12 MED ORDER — POTASSIUM CHLORIDE CRYS ER 20 MEQ PO TBCR
40.0000 meq | EXTENDED_RELEASE_TABLET | Freq: Two times a day (BID) | ORAL | Status: DC
  Administered 2019-08-12 – 2019-08-14 (×5): 40 meq via ORAL
  Filled 2019-08-12 (×6): qty 2

## 2019-08-12 MED ORDER — POTASSIUM CHLORIDE CRYS ER 20 MEQ PO TBCR
40.0000 meq | EXTENDED_RELEASE_TABLET | ORAL | Status: AC
  Administered 2019-08-12 (×3): 40 meq via ORAL
  Filled 2019-08-12 (×3): qty 2

## 2019-08-12 MED ORDER — TAMSULOSIN HCL 0.4 MG PO CAPS
0.4000 mg | ORAL_CAPSULE | Freq: Every day | ORAL | Status: DC
  Administered 2019-08-12 – 2019-08-15 (×4): 0.4 mg via ORAL
  Filled 2019-08-12 (×4): qty 1

## 2019-08-12 NOTE — Significant Event (Addendum)
HOSPITAL MEDICINE OVERNIGHT EVENT NOTE  Notified by nursing patient has been unable to void since the beginning of the shift (7PM).  Bladder scan reveals 566ml in the bladder.  Will order interimttent catheterization, monitor for recurrence.   Will defer to daytime provider on decision to start Flomax or similar agent.   [place]  Notified by nursing potassium is 2.4.  Will order PO Potassium chloride 22meq Q4hrs x 3 doses.   Sherryll Burger Edwyna Dangerfield

## 2019-08-12 NOTE — Progress Notes (Addendum)
PROGRESS NOTE    RAHKIM RABALAIS  OEH:[id number] DOB: 23-Mar-1945 DOA: 08/10/2019 PCP: Jinny Sanders, MD   Brief Narrative:  Patient is a 74 year old male with history of coronary artery  disease, prediabetes, depression who was recently hospitalized for abdominal pain and was found to have a bowel obstruction requiring NG tube.  Underwent colonoscopy which did not reveal any mass but showed diverticulosis.  He was discharged after he improved.  At that time he was found to have Yersinia in his GI pathogen panel and was treated with short course of doxycycline.  After going home, he again developed abdominal pain, started having loose stools, vomiting, feeling fatigue, loss of appetite.  He then presented back to the emergency department.  On presentation, he was found to have severe hypokalemia, lactic acidosis, AKI.  CT abdomen/pelvis showed colonic wall thickening concerning for partial colonic obstruction and diverticulitis.  Patient started on IV antibiotics, potassium supplementation, IV fluids.  GI consulted  Assessment & Plan:   Principal Problem:   Acute colitis Active Problems:   Coronary artery disease involving native coronary artery of native heart without angina pectoris   Hypotension   Acute renal failure (HCC)   Abnormal CT scan, colon   Lactic acid acidosis   Hypokalemia   Acute colitis/diverticulitis/severe diarrhea: Presented with abdomen pain, nausea, vomiting, lactic acidosis, AKI, hypokalemia.  Started on IV fluids, Zosyn.  GI pathogen panel, C. Difficile sent but came back negative.  He is having profuse diarrhea.  Nausea and vomiting have improved. GI consulted Elevated CPR. Checking fecal calprotectin.  GI planning for repeat flexible sigmoidoscopy.His symptoms have somewhat improved.  His abdomen was nontender, has good bowel sounds.  Nausea has improved.  No obstructive changes in the CT scan.  He still complains of some abdominal discomfort. Patient was  initially thought to have sepsis but this is less likely because his cultures are negative and the sepsis physiology have resolved with IV fluids/volume repletion.  Continue Zosyn for possible diverticulitis for now  AKI: Improved with IV fluids  Lactic acidosis: Secondary to hypovolemia from diarrhea.  Lactic acidosis resolved with IV fluids  Severe hypokalemia: Being supplemented with potassium.Will monitor K level  History of coronary artery disease: Cardiac medications on hold for now.  Stable from cardiac standpoint.  Abnormal chest x-ray: Chest x-ray showed lingular atelectasis versus infiltrate.  He does not have any respiratory symptoms at present.        DVT prophylaxis:SCD Code Status: Full Family Communication: None present at the bedside Status is: Inpatient  Remains inpatient appropriate because:IV treatments appropriate due to intensity of illness or inability to take PO   Dispo: The patient is from: Home              Anticipated d/c is to: Home              Anticipated d/c date is: 2 days              Patient currently is not medically stable to d/c.  Still having diarrhea.  GI planning for sigmoidoscopy     Consultants: GI  Procedures:None  Antimicrobials:  Anti-infectives (From admission, onward)   Start     Dose/Rate Route Frequency Ordered Stop   08/11/19 0600  piperacillin-tazobactam (ZOSYN) IVPB 3.375 g  Status:  Discontinued        3.375 g 12.5 mL/hr over 240 Minutes Intravenous Every 8 hours 08/11/19 0023 08/11/19 1129   08/10/19 2145  piperacillin-tazobactam (ZOSYN) IVPB 3.375  g        3.375 g 12.5 mL/hr over 240 Minutes Intravenous  Once 08/10/19 2140 08/11/19 0156      Subjective:  Patient seen and examined at the bedside this morning.  Hemodynamically stable.  Still having diarrhea.  Complains of some abdominal discomfort but no nausea or vomiting.  Tolerating clear liquid diet  Objective: Vitals:   08/12/19 0015 08/12/19 0353 08/12/19  0500 08/12/19 0723  BP: (!) 132/96 (!) 143/59  (!) 129/88  Pulse: 100 100    Resp: 18 16 16 18   Temp: 98.8 F (37.1 C) 98 F (36.7 C)  98.7 F (37.1 C)  TempSrc: Oral Oral  Oral  SpO2: 97% 96%  97%  Weight:      Height:        Intake/Output Summary (Last 24 hours) at 08/12/2019 0751 Last data filed at 08/12/2019 0500 Gross per 24 hour  Intake 2025.69 ml  Output 1200 ml  Net 825.69 ml   Filed Weights   08/10/19 1924  Weight: 74.3 kg    Examination:   General exam: Comfortable HEENT:PERRL,Oral mucosa moist, Ear/Nose normal on gross exam Respiratory system: Bilateral equal air entry, normal vesicular breath sounds, no wheezes or crackles  Cardiovascular system: S1 & S2 heard, RRR. No JVD, murmurs, rubs, gallops or clicks. Gastrointestinal system: Abdomen is distended, soft and nontender. No organomegaly or masses felt. Normal bowel sounds heard. Central nervous system: Alert and oriented. No focal neurological deficits. Extremities: No edema, no clubbing ,no cyanosis, distal peripheral pulses palpable. Skin: No rashes, lesions or ulcers,no icterus ,no pallor   Data Reviewed: I have personally reviewed following labs and imaging studies  CBC: Recent Labs  Lab 08/10/19 1936 08/11/19 0208  WBC 10.7* 6.9  HGB 14.7 11.2*  HCT 43.9 32.4*  MCV 90.5 86.4  PLT 462* 702   Basic Metabolic Panel: Recent Labs  Lab 08/10/19 1936 08/10/19 2321 08/11/19 0208 08/11/19 1044 08/11/19 1530 08/12/19 0111  NA 135  --  136 138  --  138  K 2.6*  --  2.3* 2.7* 3.9 2.4*  CL 97*  --  106 109  --  110  CO2 19*  --  20* 18*  --  19*  GLUCOSE 154*  --  131* 121*  --  93  BUN 35*  --  33* 28*  --  17  CREATININE 2.34*  --  1.74* 1.35*  --  0.95  CALCIUM 8.2*  --  7.0* 7.3*  --  7.4*  MG  --  1.2*  --  2.3  --   --    GFR: Estimated Creatinine Clearance: 62.5 mL/min (by C-G formula based on SCr of 0.95 mg/dL). Liver Function Tests: Recent Labs  Lab 08/10/19 1936 08/11/19 0208    AST 21 13*  ALT 30 23  ALKPHOS 86 58  BILITOT 0.9 0.6  PROT 6.1* 4.6*  ALBUMIN 2.7* 2.0*   Recent Labs  Lab 08/10/19 1936  LIPASE 18   No results for input(s): AMMONIA in the last 168 hours. Coagulation Profile: Recent Labs  Lab 08/10/19 2317  INR 1.2   Cardiac Enzymes: No results for input(s): CKTOTAL, CKMB, CKMBINDEX, TROPONINI in the last 168 hours. BNP (last 3 results) No results for input(s): PROBNP in the last 8760 hours. HbA1C: No results for input(s): HGBA1C in the last 72 hours. CBG: No results for input(s): GLUCAP in the last 168 hours. Lipid Profile: No results for input(s): CHOL, HDL, LDLCALC, TRIG, CHOLHDL, LDLDIRECT  in the last 72 hours. Thyroid Function Tests: No results for input(s): TSH, T4TOTAL, FREET4, T3FREE, THYROIDAB in the last 72 hours. Anemia Panel: No results for input(s): VITAMINB12, FOLATE, FERRITIN, TIBC, IRON, RETICCTPCT in the last 72 hours. Sepsis Labs: Recent Labs  Lab 08/10/19 1936 08/10/19 2123 08/11/19 0208  LATICACIDVEN 6.4* 6.2* 1.1    Recent Results (from the past 240 hour(s))  Blood culture (routine single)     Status: None   Collection Time: 08/03/19  9:14 AM   Specimen: Blood   BLD  Result Value Ref Range Status   BLOOD CULTURE, ROUTINE Final report  Final   Organism ID, Bacteria Comment  Final    Comment: No aerobic or anaerobic growth in five days.  Blood culture (routine single)     Status: None   Collection Time: 08/03/19  9:14 AM   Specimen: Blood   BLD  Result Value Ref Range Status   BLOOD CULTURE, ROUTINE Final report  Final   Organism ID, Bacteria Comment  Final    Comment: No aerobic or anaerobic growth in five days.  Culture, blood (Routine x 2)     Status: None (Preliminary result)   Collection Time: 08/10/19  9:15 PM   Specimen: BLOOD  Result Value Ref Range Status   Specimen Description BLOOD RIGHT ARM  Final   Special Requests   Final    BOTTLES DRAWN AEROBIC AND ANAEROBIC Blood Culture results  may not be optimal due to an inadequate volume of blood received in culture bottles   Culture   Final    NO GROWTH < 24 HOURS Performed at [facility], [address], [place], [place] [address]    Report Status PENDING  Incomplete  Culture, blood (Routine x 2)     Status: None (Preliminary result)   Collection Time: 08/10/19  9:15 PM   Specimen: BLOOD  Result Value Ref Range Status   Specimen Description BLOOD LEFT FOREARM  Final   Special Requests   Final    BOTTLES DRAWN AEROBIC AND ANAEROBIC Blood Culture results may not be optimal due to an inadequate volume of blood received in culture bottles   Culture   Final    NO GROWTH < 24 HOURS Performed at [facility], [place] [address] [place], [place] [address]    Report Status PENDING  Incomplete  SARS Coronavirus 2 by RT PCR (hospital order, performed in [facility] hospital lab) Nasopharyngeal Nasopharyngeal Swab     Status: None   Collection Time: 08/11/19 12:41 AM   Specimen: Nasopharyngeal Swab  Result Value Ref Range Status   SARS Coronavirus 2 NEGATIVE NEGATIVE Final    Comment: (NOTE) SARS-CoV-2 target nucleic acids are NOT DETECTED.  The SARS-CoV-2 RNA is generally detectable in upper and lower respiratory specimens during the acute phase of infection. The lowest concentration of SARS-CoV-2 viral copies this assay can detect is 250 copies / mL. A negative result does not preclude SARS-CoV-2 infection and should not be used as the sole basis for treatment or other patient management decisions.  A negative result may occur with improper specimen collection / handling, submission of specimen other than nasopharyngeal swab, presence of viral mutation(s) within the areas targeted by this assay, and inadequate number of viral copies (<250 copies / mL). A negative result must be combined with clinical observations, patient history, and epidemiological information.  Fact Sheet for Patients:     StrictlyIdeas.no  Fact Sheet for Healthcare Providers: BankingDealers.co.za  This test is not yet approved or  cleared by the [place] and has been authorized for detection and/or diagnosis of SARS-CoV-2 by FDA under an Emergency Use Authorization (EUA).  This EUA will remain in effect (meaning this test can be used) for the duration of the COVID-19 declaration under Section 564(b)(1) of the Act, 21 U.S.C. section 360bbb-3(b)(1), unless the authorization is terminated or revoked sooner.  Performed at [facility], [place] [address], [place], [place] [address]   Gastrointestinal Panel by PCR , Stool     Status: None   Collection Time: 08/11/19  3:53 AM   Specimen: Stool  Result Value Ref Range Status   Campylobacter species NOT DETECTED NOT DETECTED Final   Plesimonas shigelloides NOT DETECTED NOT DETECTED Final   Salmonella species NOT DETECTED NOT DETECTED Final   Yersinia enterocolitica NOT DETECTED NOT DETECTED Final   Vibrio species NOT DETECTED NOT DETECTED Final   Vibrio cholerae NOT DETECTED NOT DETECTED Final   Enteroaggregative E coli (EAEC) NOT DETECTED NOT DETECTED Final   Enteropathogenic E coli (EPEC) NOT DETECTED NOT DETECTED Final   Enterotoxigenic E coli (ETEC) NOT DETECTED NOT DETECTED Final   Shiga like toxin producing E coli (STEC) NOT DETECTED NOT DETECTED Final   Shigella/Enteroinvasive E coli (EIEC) NOT DETECTED NOT DETECTED Final   Cryptosporidium NOT DETECTED NOT DETECTED Final   Cyclospora cayetanensis NOT DETECTED NOT DETECTED Final   Entamoeba histolytica NOT DETECTED NOT DETECTED Final   Giardia lamblia NOT DETECTED NOT DETECTED Final   Adenovirus F40/41 NOT DETECTED NOT DETECTED Final   Astrovirus NOT DETECTED NOT DETECTED Final   Norovirus GI/GII NOT DETECTED NOT DETECTED Final   Rotavirus A NOT DETECTED NOT DETECTED Final   Sapovirus (I, II, IV, and V) NOT DETECTED NOT DETECTED Final     Comment: Performed at [facility], [address] [place], [place] [address]  C Difficile Quick Screen w PCR reflex     Status: None   Collection Time: 08/11/19  3:53 AM   Specimen: Stool  Result Value Ref Range Status   C Diff antigen NEGATIVE NEGATIVE Final   C Diff toxin NEGATIVE NEGATIVE Final   C Diff interpretation No C. difficile detected.  Final    Comment: Performed at [facility], [place] [address], [place], [place] [address]  MRSA PCR Screening     Status: None   Collection Time: 08/12/19  1:26 AM   Specimen: Nasal Mucosa; Nasopharyngeal  Result Value Ref Range Status   MRSA by PCR NEGATIVE NEGATIVE Final    Comment:        The GeneXpert MRSA Assay (FDA approved for NASAL specimens only), is one component of a comprehensive MRSA colonization surveillance program. It is not intended to diagnose MRSA infection nor to guide or monitor treatment for MRSA infections. Performed at [facility], [place] [address], [place],  [address]          Radiology Studies: CT ABDOMEN PELVIS WO CONTRAST  Result Date: 08/10/2019 CLINICAL DATA:  Abdominal pain and diarrhea for 1 week following colonoscopy EXAM: CT ABDOMEN AND PELVIS WITHOUT CONTRAST TECHNIQUE: Multidetector CT imaging of the abdomen and pelvis was performed following the standard protocol without IV contrast. COMPARISON:  08/03/2019 FINDINGS: Lower chest: Calcified granuloma is noted in the left laterally. Mild scarring is noted in the right lung base stable from the prior exam. Focal herniation of fat is noted to the posteromedial left hemidiaphragm. Hepatobiliary:  Gallbladder is well distended. The liver is within normal limits. Pancreas: Unremarkable. No pancreatic ductal dilatation or surrounding inflammatory changes. Spleen: Multiple calcified granulomas are noted. No other focal abnormality is seen. Adrenals/Urinary Tract: Adrenal glands are within normal limits. Kidneys are well visualize  without renal calculi or obstructive change. The bladder is within normal limits. Stomach/Bowel: Colon demonstrates scattered diverticular change as well as wall thickening in the sigmoid. Proximal to this there is significant dilatation of the colon with fluid as well as some mild dilatation of distal small bowel. These changes are consistent with a partial colonic obstruction related to the wall thickening seen in the sigmoid. This is again somewhat suspicious for underlying mass although recent colonoscopy shows no definitive mass. Stomach is within normal limits. Vascular/Lymphatic: Aortic atherosclerosis. No enlarged abdominal or pelvic lymph nodes. Reproductive: Prostate is unremarkable. Other: No abdominal wall hernia or abnormality. No abdominopelvic ascites. Musculoskeletal: No acute or significant osseous findings. IMPRESSION: Persistent wall thickening within the sigmoid with more proximal fluid retention in the colon and distal small bowel dilatation consistent with a partial colonic obstruction. These changes are likely related to diverticulitis as recent colonoscopy shows no definitive mass lesion. No abscess or perforation is noted. Prior granulomatous disease. No other focal abnormality is noted. Electronically Signed   By: Inez Catalina M.D.   On: 08/10/2019 22:34   DG Chest 2 View  Result Date: 08/10/2019 CLINICAL DATA:  74 year old male with sepsis. EXAM: CHEST - 2 VIEW COMPARISON:  Chest radiograph dated 07/23/2019. FINDINGS: Patchy area of density in the lingula may represent atelectasis. Infiltrate is not excluded clinical correlation is recommended. There is no pleural effusion or pneumothorax. Left lung base calcified granuloma. The cardiac silhouette is within limits. No acute osseous pathology. IMPRESSION: Lingular atelectasis versus infiltrate. Electronically Signed   By: Anner Crete M.D.   On: 08/10/2019 21:53        Scheduled Meds: . mesalamine  4.8 g Oral Q breakfast  .  pantoprazole  40 mg Oral Q0600  . potassium chloride  40 mEq Oral Q4H   Continuous Infusions: . 0.9 % NaCl with KCl 20 mEq / L 100 mL/hr at 08/11/19 0809     LOS: 2 days    Time spent:35 mins. More than 50% of that time was spent in counseling and/or coordination of care.      Shelly Coss, MD [facility] P7/30/2021, 7:51 AM

## 2019-08-12 NOTE — Progress Notes (Signed)
Pt urinated 100 ml since 1900. Bladder scan indicated 586 ml of urine in bladder. All attempts to urinated unsuccessful.  Order placed by MD for in and out cath. Catheterization completed at the bedside, pt room 2w12. Procedure explained to pt and pt agreed.  Catheter inserted by Lorenso Courier, RN in presence of Nafatari, RN. Sterile technique maintained throughout procedure and pt tolerated well. Collected 1100 of urine.  Will continue to monitor pt.

## 2019-08-12 NOTE — Progress Notes (Signed)
CRITICAL VALUE ALERT  Critical Value:  Potassium 2.4  Date & Time Notied:  08/12/19 at 0249  Provider Notified: Cyd Silence, MD  Orders Received/Actions taken: New orders placed, please see MAR.

## 2019-08-12 NOTE — Progress Notes (Addendum)
Daily Rounding Note  08/12/2019, 3:34 PM  LOS: 2 days   SUBJECTIVE:   Chief complaint: ab pain, diarrhea, n/v.        Flesiseal in place w watery light brown stool.  No belly pain today.  Feels better after several hours of sleep.   Tolerating clears Not finding records as to stool output amounts.    OBJECTIVE:         Vital signs in last 24 hours:    Temp:  [97.8 F (36.6 C)-98.8 F (37.1 C)] 98.7 F (37.1 C) (07/30 1215) Pulse Rate:  [86-100] 100 (07/30 0353) Resp:  [12-19] 19 (07/30 1215) BP: (126-143)/(59-96) 126/62 (07/30 1215) SpO2:  [92 %-97 %] 97 % (07/30 1215) Last BM Date: 08/12/19 Filed Weights   08/10/19 1924  Weight: 74.3 kg   General: looks comfortable and not acutely ill   Heart: RRR Chest: clear Abdomen: soft, NT, Active BS.  = BS.  ~ 200 ml watery light brown stool in bag  Extremities: no CCE Neuro/Psych:  Oriented x 3.    Intake/Output from previous day: 07/29 0701 - 07/30 0700 In: 2025.7 [I.V.:2025.7] Out: 1200 [Urine:1200]  Intake/Output this shift: Total I/O In: -  Out: 250 [Urine:250]  Lab Results: Recent Labs    08/10/19 1936 08/11/19 0208  WBC 10.7* 6.9  HGB 14.7 11.2*  HCT 43.9 32.4*  PLT 462* 245   BMET Recent Labs    08/11/19 0208 08/11/19 0208 08/11/19 1044 08/11/19 1044 08/11/19 1530 08/12/19 0111 08/12/19 1451  NA 136  --  138  --   --  138  --   K 2.3*   < > 2.7*   < > 3.9 2.4* 3.1*  CL 106  --  109  --   --  110  --   CO2 20*  --  18*  --   --  19*  --   GLUCOSE 131*  --  121*  --   --  93  --   BUN 33*  --  28*  --   --  17  --   CREATININE 1.74*  --  1.35*  --   --  0.95  --   CALCIUM 7.0*  --  7.3*  --   --  7.4*  --    < > = values in this interval not displayed.   LFT Recent Labs    08/10/19 1936 08/11/19 0208  PROT 6.1* 4.6*  ALBUMIN 2.7* 2.0*  AST 21 13*  ALT 30 23  ALKPHOS 86 58  BILITOT 0.9 0.6   PT/INR Recent Labs     08/10/19 2317  LABPROT 14.5  INR 1.2   Hepatitis Panel No results for input(s): HEPBSAG, HCVAB, HEPAIGM, HEPBIGM in the last 72 hours.  Studies/Results: CT ABDOMEN PELVIS WO CONTRAST  Result Date: 08/10/2019 CLINICAL DATA:  Abdominal pain and diarrhea for 1 week following colonoscopy EXAM: CT ABDOMEN AND PELVIS WITHOUT CONTRAST TECHNIQUE: Multidetector CT imaging of the abdomen and pelvis was performed following the standard protocol without IV contrast. COMPARISON:  08/03/2019 FINDINGS: Lower chest: Calcified granuloma is noted in the left laterally. Mild scarring is noted in the right lung base stable from the prior exam. Focal herniation of fat is noted to the posteromedial left hemidiaphragm. Hepatobiliary: Gallbladder is well distended. The liver is within normal limits. Pancreas: Unremarkable. No pancreatic ductal dilatation or surrounding inflammatory changes. Spleen: Multiple calcified granulomas are noted. No other  focal abnormality is seen. Adrenals/Urinary Tract: Adrenal glands are within normal limits. Kidneys are well visualize without renal calculi or obstructive change. The bladder is within normal limits. Stomach/Bowel: Colon demonstrates scattered diverticular change as well as wall thickening in the sigmoid. Proximal to this there is significant dilatation of the colon with fluid as well as some mild dilatation of distal small bowel. These changes are consistent with a partial colonic obstruction related to the wall thickening seen in the sigmoid. This is again somewhat suspicious for underlying mass although recent colonoscopy shows no definitive mass. Stomach is within normal limits. Vascular/Lymphatic: Aortic atherosclerosis. No enlarged abdominal or pelvic lymph nodes. Reproductive: Prostate is unremarkable. Other: No abdominal wall hernia or abnormality. No abdominopelvic ascites. Musculoskeletal: No acute or significant osseous findings. IMPRESSION: Persistent wall thickening  within the sigmoid with more proximal fluid retention in the colon and distal small bowel dilatation consistent with a partial colonic obstruction. These changes are likely related to diverticulitis as recent colonoscopy shows no definitive mass lesion. No abscess or perforation is noted. Prior granulomatous disease. No other focal abnormality is noted. Electronically Signed   By: Inez Catalina M.D.   On: 08/10/2019 22:34   DG Chest 2 View  Result Date: 08/10/2019 CLINICAL DATA:  74 year old male with sepsis. EXAM: CHEST - 2 VIEW COMPARISON:  Chest radiograph dated 07/23/2019. FINDINGS: Patchy area of density in the lingula may represent atelectasis. Infiltrate is not excluded clinical correlation is recommended. There is no pleural effusion or pneumothorax. Left lung base calcified granuloma. The cardiac silhouette is within limits. No acute osseous pathology. IMPRESSION: Lingular atelectasis versus infiltrate. Electronically Signed   By: Anner Crete M.D.   On: 08/10/2019 21:53   Scheduled Meds: . mesalamine  4.8 g Oral Q breakfast  . pantoprazole  40 mg Oral Q0600  . potassium chloride  40 mEq Oral BID  . tamsulosin  0.4 mg Oral Daily   Continuous Infusions: . 0.9 % NaCl with KCl 20 mEq / L 100 mL/hr at 08/11/19 0809   PRN Meds:.acetaminophen **OR** acetaminophen, morphine injection, ondansetron **OR** ondansetron (ZOFRAN) IV  ASSESMENT:   *   abd pain, n/v.  Persistent for 5 weeks. ? If this is diverticular associated colitis.   Day 2 Lialda.  All abx discontinued.  Stool path panel and c diff all negative yesterday.     *  Hypokalemia.  Better, not resolved.    *     PLAN   *   Continue Lialda.  Advance to low fiber diet.  Observe.  No plans for rescope unless does not improve.   Document amounts of stool.      Azucena Freed  08/12/2019, 3:34 PM Phone [phone]

## 2019-08-13 LAB — BASIC METABOLIC PANEL
Anion gap: 7 (ref 5–15)
BUN: 9 mg/dL (ref 8–23)
CO2: 20 mmol/L — ABNORMAL LOW (ref 22–32)
Calcium: 7.6 mg/dL — ABNORMAL LOW (ref 8.9–10.3)
Chloride: 113 mmol/L — ABNORMAL HIGH (ref 98–111)
Creatinine, Ser: 0.92 mg/dL (ref 0.61–1.24)
GFR calc Af Amer: >60 mL/min (ref >60)
GFR calc non Af Amer: >60 mL/min (ref >60)
Glucose, Bld: 101 mg/dL — ABNORMAL HIGH (ref 70–99)
Potassium: 3 mmol/L — ABNORMAL LOW (ref 3.5–5.1)
Sodium: 140 mmol/L (ref 135–145)

## 2019-08-13 MED ORDER — POTASSIUM CHLORIDE 10 MEQ/100ML IV SOLN
10.0000 meq | INTRAVENOUS | Status: AC
  Administered 2019-08-13 (×5): 10 meq via INTRAVENOUS
  Filled 2019-08-13 (×5): qty 100

## 2019-08-13 NOTE — Progress Notes (Signed)
PROGRESS NOTE    CAPONE SCHWINN  SPQ:[id number] DOB: 14-Dec-1945 DOA: 08/10/2019 PCP: Jinny Sanders, MD   Brief Narrative:  Patient is a 74 year old male with history of coronary artery  disease, prediabetes, depression who was recently hospitalized for abdominal pain and was found to have a bowel obstruction requiring NG tube.  Underwent colonoscopy which did not reveal any mass but showed diverticulosis.  He was discharged after he improved.  At that time he was found to have Yersinia in his GI pathogen panel and was treated with short course of doxycycline.  After going home, he again developed abdominal pain, started having loose stools, vomiting, feeling fatigue, loss of appetite.  He then presented back to the emergency department.  On presentation, he was found to have severe hypokalemia, lactic acidosis, AKI.  CT abdomen/pelvis showed colonic wall thickening concerning for partial colonic obstruction and diverticulitis.  Patient started on IV antibiotics, potassium supplementation, IV fluids.  GI consulted and following.  Assessment & Plan:   Principal Problem:   Acute colitis Active Problems:   Coronary artery disease involving native coronary artery of native heart without angina pectoris   Hypotension   Acute renal failure (HCC)   Abnormal CT scan, colon   Lactic acid acidosis   Hypokalemia   Acute colitis/diverticulitis/severe diarrhea: Presented with abdomen pain, nausea, vomiting, lactic acidosis, AKI, hypokalemia.  Started on IV fluids, Zosyn on admission.  GI pathogen panel, C. Difficile sent but came back negative.  Suspected to have segmental colitis associated with diverticulosis.   Nausea and vomiting have improved.Continues to have diarrhoea. GI consulted and following. Elevated CPR. Checking fecal calprotectin.  GI planning for repeat flexible sigmoidoscopy.His abdomen was nontender, has good bowel sounds.  No obstructive changes in the CT scan.  Abdominal pain has  resolved.   Patient was initially thought to have sepsis but this is less likely because his cultures are negative and the sepsis physiology have resolved with IV fluids/volume repletion.  Antibiotics discontinued.On Lialda .  AKI: Improved with IV fluids  Lactic acidosis: Secondary to hypovolemia from diarrhea.  Lactic acidosis resolved with IV fluids  Severe hypokalemia: Being supplemented with potassium.Will monitor K level  History of coronary artery disease: Cardiac medications on hold for now.  Stable from cardiac standpoint.  Abnormal chest x-ray: Chest x-ray showed lingular atelectasis versus infiltrate.  He does not have any respiratory symptoms at present.        DVT prophylaxis:SCD Code Status: Full Family Communication: None present at the bedside Status is: Inpatient  Remains inpatient appropriate because:IV treatments appropriate due to intensity of illness or inability to take PO   Dispo: The patient is from: Home              Anticipated d/c is to: Home              Anticipated d/c date is: 2 days              Patient currently is not medically stable to d/c.  Still having diarrhea.  GI planning for sigmoidoscopy     Consultants: GI  Procedures:None  Antimicrobials:  Anti-infectives (From admission, onward)   Start     Dose/Rate Route Frequency Ordered Stop   08/11/19 0600  piperacillin-tazobactam (ZOSYN) IVPB 3.375 g  Status:  Discontinued        3.375 g 12.5 mL/hr over 240 Minutes Intravenous Every 8 hours 08/11/19 0023 08/11/19 1129   08/10/19 2145  piperacillin-tazobactam (ZOSYN) IVPB 3.375 g  3.375 g 12.5 mL/hr over 240 Minutes Intravenous  Once 08/10/19 2140 08/11/19 0156      Subjective:  Patient seen and examined at the bedside this morning.  Hemodynamically stable.  He feels more comfortable today.  Abdomen pain has resolved.  But unfortunately he continues to have diarrhea though it has somewhat slowed.  No nausea or  vomiting.  Objective: Vitals:   08/12/19 1215 08/12/19 1706 08/12/19 1959 08/13/19 0100  BP: (!) 126/62 (!) 143/98 (!) 138/77 (!) 129/80  Pulse:    92  Resp: 19 15 12 16   Temp: 98.7 F (37.1 C) 98.7 F (37.1 C) 98.8 F (37.1 C) 98.6 F (37 C)  TempSrc: Oral Oral Oral Oral  SpO2: 97% 97% 99% 100%  Weight:      Height:        Intake/Output Summary (Last 24 hours) at 08/13/2019 0732 Last data filed at 08/13/2019 0100 Gross per 24 hour  Intake --  Output 3000 ml  Net -3000 ml   Filed Weights   08/10/19 1924  Weight: 74.3 kg    Examination:   General exam: comfortable Respiratory system: Bilateral equal air entry, normal vesicular breath sounds, no wheezes or crackles  Cardiovascular system: S1 & S2 heard, RRR. No JVD, murmurs, rubs, gallops or clicks. Gastrointestinal system: Abdomen is nondistended, soft and nontender. No organomegaly or masses felt. Normal bowel sounds heard. Central nervous system: Alert and oriented. No focal neurological deficits. Extremities: No edema, no clubbing ,no cyanosis    Data Reviewed: I have personally reviewed following labs and imaging studies  CBC: Recent Labs  Lab 08/10/19 1936 08/11/19 0208  WBC 10.7* 6.9  HGB 14.7 11.2*  HCT 43.9 32.4*  MCV 90.5 86.4  PLT 462* 564   Basic Metabolic Panel: Recent Labs  Lab 08/10/19 1936 08/10/19 1936 08/10/19 2321 08/11/19 0208 08/11/19 0208 08/11/19 1044 08/11/19 1530 08/12/19 0111 08/12/19 1451 08/13/19 0138  NA 135  --   --  136  --  138  --  138  --  140  K 2.6*   < >  --  2.3*   < > 2.7* 3.9 2.4* 3.1* 3.0*  CL 97*  --   --  106  --  109  --  110  --  113*  CO2 19*  --   --  20*  --  18*  --  19*  --  20*  GLUCOSE 154*  --   --  131*  --  121*  --  93  --  101*  BUN 35*  --   --  33*  --  28*  --  17  --  9  CREATININE 2.34*  --   --  1.74*  --  1.35*  --  0.95  --  0.92  CALCIUM 8.2*  --   --  7.0*  --  7.3*  --  7.4*  --  7.6*  MG  --   --  1.2*  --   --  2.3  --   --    --   --    < > = values in this interval not displayed.   GFR: Estimated Creatinine Clearance: 64.5 mL/min (by C-G formula based on SCr of 0.92 mg/dL). Liver Function Tests: Recent Labs  Lab 08/10/19 1936 08/11/19 0208  AST 21 13*  ALT 30 23  ALKPHOS 86 58  BILITOT 0.9 0.6  PROT 6.1* 4.6*  ALBUMIN 2.7* 2.0*   Recent Labs  Lab 08/10/19 1936  LIPASE 18   No results for input(s): AMMONIA in the last 168 hours. Coagulation Profile: Recent Labs  Lab 08/10/19 2317  INR 1.2   Cardiac Enzymes: No results for input(s): CKTOTAL, CKMB, CKMBINDEX, TROPONINI in the last 168 hours. BNP (last 3 results) No results for input(s): PROBNP in the last 8760 hours. HbA1C: No results for input(s): HGBA1C in the last 72 hours. CBG: No results for input(s): GLUCAP in the last 168 hours. Lipid Profile: No results for input(s): CHOL, HDL, LDLCALC, TRIG, CHOLHDL, LDLDIRECT in the last 72 hours. Thyroid Function Tests: No results for input(s): TSH, T4TOTAL, FREET4, T3FREE, THYROIDAB in the last 72 hours. Anemia Panel: No results for input(s): VITAMINB12, FOLATE, FERRITIN, TIBC, IRON, RETICCTPCT in the last 72 hours. Sepsis Labs: Recent Labs  Lab 08/10/19 1936 08/10/19 2123 08/11/19 0208  LATICACIDVEN 6.4* 6.2* 1.1    Recent Results (from the past 240 hour(s))  Blood culture (routine single)     Status: None   Collection Time: 08/03/19  9:14 AM   Specimen: Blood   BLD  Result Value Ref Range Status   BLOOD CULTURE, ROUTINE Final report  Final   Organism ID, Bacteria Comment  Final    Comment: No aerobic or anaerobic growth in five days.  Blood culture (routine single)     Status: None   Collection Time: 08/03/19  9:14 AM   Specimen: Blood   BLD  Result Value Ref Range Status   BLOOD CULTURE, ROUTINE Final report  Final   Organism ID, Bacteria Comment  Final    Comment: No aerobic or anaerobic growth in five days.  Culture, blood (Routine x 2)     Status: None (Preliminary  result)   Collection Time: 08/10/19  9:15 PM   Specimen: BLOOD  Result Value Ref Range Status   Specimen Description BLOOD RIGHT ARM  Final   Special Requests   Final    BOTTLES DRAWN AEROBIC AND ANAEROBIC Blood Culture results may not be optimal due to an inadequate volume of blood received in culture bottles   Culture   Final    NO GROWTH 2 DAYS Performed at [facility], [place] [address] [place], [place] [address]    Report Status PENDING  Incomplete  Culture, blood (Routine x 2)     Status: None (Preliminary result)   Collection Time: 08/10/19  9:15 PM   Specimen: BLOOD  Result Value Ref Range Status   Specimen Description BLOOD LEFT FOREARM  Final   Special Requests   Final    BOTTLES DRAWN AEROBIC AND ANAEROBIC Blood Culture results may not be optimal due to an inadequate volume of blood received in culture bottles   Culture   Final    NO GROWTH 2 DAYS Performed at [facility], [place] [address] [place], [place] [address]    Report Status PENDING  Incomplete  SARS Coronavirus 2 by RT PCR (hospital order, performed in [facility] hospital lab) Nasopharyngeal Nasopharyngeal Swab     Status: None   Collection Time: 08/11/19 12:41 AM   Specimen: Nasopharyngeal Swab  Result Value Ref Range Status   SARS Coronavirus 2 NEGATIVE NEGATIVE Final    Comment: (NOTE) SARS-CoV-2 target nucleic acids are NOT DETECTED.  The SARS-CoV-2 RNA is generally detectable in upper and lower respiratory specimens during the acute phase of infection. The lowest concentration of SARS-CoV-2 viral copies this assay can detect is 250 copies / mL. A negative result does  not preclude SARS-CoV-2 infection and should not be used as the sole basis for treatment or other patient management decisions.  A negative result may occur with improper specimen collection / handling, submission of specimen other than nasopharyngeal swab, presence of viral mutation(s) within the areas targeted by this assay,  and inadequate number of viral copies (<250 copies / mL). A negative result must be combined with clinical observations, patient history, and epidemiological information.  Fact Sheet for Patients:   StrictlyIdeas.no  Fact Sheet for Healthcare Providers: BankingDealers.co.za  This test is not yet approved or  cleared by the [place] FDA and has been authorized for detection and/or diagnosis of SARS-CoV-2 by FDA under an Emergency Use Authorization (EUA).  This EUA will remain in effect (meaning this test can be used) for the duration of the COVID-19 declaration under Section 564(b)(1) of the Act, 21 U.S.C. section 360bbb-3(b)(1), unless the authorization is terminated or revoked sooner.  Performed at [facility], [place] [address] [place], [place] [address]   Gastrointestinal Panel by PCR , Stool     Status: None   Collection Time: 08/11/19  3:53 AM   Specimen: Stool  Result Value Ref Range Status   Campylobacter species NOT DETECTED NOT DETECTED Final   Plesimonas shigelloides NOT DETECTED NOT DETECTED Final   Salmonella species NOT DETECTED NOT DETECTED Final   Yersinia enterocolitica NOT DETECTED NOT DETECTED Final   Vibrio species NOT DETECTED NOT DETECTED Final   Vibrio cholerae NOT DETECTED NOT DETECTED Final   Enteroaggregative E coli (EAEC) NOT DETECTED NOT DETECTED Final   Enteropathogenic E coli (EPEC) NOT DETECTED NOT DETECTED Final   Enterotoxigenic E coli (ETEC) NOT DETECTED NOT DETECTED Final   Shiga like toxin producing E coli (STEC) NOT DETECTED NOT DETECTED Final   Shigella/Enteroinvasive E coli (EIEC) NOT DETECTED NOT DETECTED Final   Cryptosporidium NOT DETECTED NOT DETECTED Final   Cyclospora cayetanensis NOT DETECTED NOT DETECTED Final   Entamoeba histolytica NOT DETECTED NOT DETECTED Final   Giardia lamblia NOT DETECTED NOT DETECTED Final   Adenovirus F40/41 NOT DETECTED NOT DETECTED Final    Astrovirus NOT DETECTED NOT DETECTED Final   Norovirus GI/GII NOT DETECTED NOT DETECTED Final   Rotavirus A NOT DETECTED NOT DETECTED Final   Sapovirus (I, II, IV, and V) NOT DETECTED NOT DETECTED Final    Comment: Performed at [facility], [place] [place], [place] [address]  C Difficile Quick Screen w PCR reflex     Status: None   Collection Time: 08/11/19  3:53 AM   Specimen: Stool  Result Value Ref Range Status   C Diff antigen NEGATIVE NEGATIVE Final   C Diff toxin NEGATIVE NEGATIVE Final   C Diff interpretation No C. difficile detected.  Final    Comment: Performed at [facility], [place] [address], [place], [place] [address]  MRSA PCR Screening     Status: None   Collection Time: 08/12/19  1:26 AM   Specimen: Nasal Mucosa; Nasopharyngeal  Result Value Ref Range Status   MRSA by PCR NEGATIVE NEGATIVE Final    Comment:        The GeneXpert MRSA Assay (FDA approved for NASAL specimens only), is one component of a comprehensive MRSA colonization surveillance program. It is not intended to diagnose MRSA infection nor to guide or monitor treatment for MRSA infections. Performed at [facility], [place] [address], [place],  [address]   Urine culture  Status: None   Collection Time: 08/12/19  3:00 AM   Specimen: Urine, Random  Result Value Ref Range Status   Specimen Description URINE, RANDOM  Final   Special Requests NONE  Final   Culture   Final    NO GROWTH Performed at [facility], [address], [place], [place] [address]    Report Status 08/12/2019 FINAL  Final         Radiology Studies: No results found.      Scheduled Meds: . mesalamine  4.8 g Oral Q breakfast  . pantoprazole  40 mg Oral Q0600  . potassium chloride  40 mEq Oral BID  . tamsulosin  0.4 mg Oral Daily   Continuous Infusions: . 0.9 % NaCl with KCl 20 mEq / L 100 mL/hr at 08/13/19 0614     LOS: 3 days    Time spent:35 mins. More than 50% of  that time was spent in counseling and/or coordination of care.      Shelly Coss, MD [facility] P7/31/2021, 7:32 AM

## 2019-08-13 NOTE — Progress Notes (Signed)
     [facility] Progress Note  CC:  Abdominal pain, diarrhea, nausea and vomiting  Subjective:  Flexiseal still with watery light brown stool.  No abdominal pain.  Stool amounts not being recorded every shift.  Fecal calprotectin has not even been collected.  Feeling better overall.  Objective:  Vital signs in last 24 hours: Temp:  [97.9 F (36.6 C)-98.8 F (37.1 C)] 97.9 F (36.6 C) (07/31 0827) Pulse Rate:  [92] 92 (07/31 0100) Resp:  [12-17] 17 (07/31 0827) BP: (123-143)/(77-98) 123/82 (07/31 0827) SpO2:  [97 %-100 %] 97 % (07/31 0827) Last BM Date: 08/12/19 General:  Alert, Well-developed, in NAD Heart:  Regular rate and rhythm; no murmurs Pulm:  CTAB.  No increased WOB. Abdomen:  Soft, non-distended.  BS present and somewhat hyperactive.  Non-tender. Extremities:  Without edema. Neurologic:  Alert and oriented x 4;  grossly normal neurologically. Psych:  Alert and cooperative. Normal mood and affect.  Intake/Output from previous day: 07/30 0701 - 07/31 0700 In: 1211.7 [I.V.:1211.7] Out: 3000 [Urine:600; Stool:2400] Intake/Output this shift: Total I/O In: 1598.6 [I.V.:1311.2; IV Piggyback:287.4] Out: 300 [Urine:300]  Lab Results: Recent Labs    08/10/19 1936 08/11/19 0208  WBC 10.7* 6.9  HGB 14.7 11.2*  HCT 43.9 32.4*  PLT 462* 245   BMET Recent Labs    08/11/19 1044 08/11/19 1530 08/12/19 0111 08/12/19 1451 08/13/19 0138  NA 138  --  138  --  140  K 2.7*   < > 2.4* 3.1* 3.0*  CL 109  --  110  --  113*  CO2 18*  --  19*  --  20*  GLUCOSE 121*  --  93  --  101*  BUN 28*  --  17  --  9  CREATININE 1.35*  --  0.95  --  0.92  CALCIUM 7.3*  --  7.4*  --  7.6*   < > = values in this interval not displayed.   LFT Recent Labs    08/11/19 0208  PROT 4.6*  ALBUMIN 2.0*  AST 13*  ALT 23  ALKPHOS 58  BILITOT 0.6   PT/INR Recent Labs    08/10/19 2317  LABPROT 14.5  INR 1.2   Assessment / Plan: *Abd pain, n/v.  Persistent for 5  weeks. ? If this is diverticular associated colitis/SCAD.   Day 3 Lialda.  All abx discontinued.  Stool path panel and c diff all negative.  Fecal calprotectin pending.     *Hypokalemia.  K+ still 3.0 today.    -Continue Lialda.  Advance to low fiber diet.  Observe.  No plans for rescope unless does not improve.  I have spoken with the nurse and asked that they document stool output every shift as it has been ordered and that they collect the fecal calprotectin that was ordered on Thursday as that had not yet been collected.  -Will add Imodium BID starting today. -Consider discontinuing flexiseal in the near future.   LOS: 3 days   Laban Emperor. Laini Urick  08/13/2019, 1:12 PM

## 2019-08-13 NOTE — Plan of Care (Signed)

## 2019-08-14 LAB — BASIC METABOLIC PANEL
Anion gap: 6 (ref 5–15)
BUN: 9 mg/dL (ref 8–23)
CO2: 19 mmol/L — ABNORMAL LOW (ref 22–32)
Calcium: 7.6 mg/dL — ABNORMAL LOW (ref 8.9–10.3)
Chloride: 116 mmol/L — ABNORMAL HIGH (ref 98–111)
Creatinine, Ser: 0.9 mg/dL (ref 0.61–1.24)
GFR calc Af Amer: >60 mL/min (ref >60)
GFR calc non Af Amer: >60 mL/min (ref >60)
Glucose, Bld: 110 mg/dL — ABNORMAL HIGH (ref 70–99)
Potassium: 3.9 mmol/L (ref 3.5–5.1)
Sodium: 141 mmol/L (ref 135–145)

## 2019-08-14 LAB — MAGNESIUM: Magnesium: 1.3 mg/dL — ABNORMAL LOW (ref 1.7–2.4)

## 2019-08-14 MED ORDER — MAGNESIUM SULFATE 4 GM/100ML IV SOLN
4.0000 g | Freq: Once | INTRAVENOUS | Status: AC
  Administered 2019-08-14: 4 g via INTRAVENOUS
  Filled 2019-08-14: qty 100

## 2019-08-14 MED ORDER — LOPERAMIDE HCL 2 MG PO CAPS: 2.0000 mg | ORAL_CAPSULE | Freq: Four times a day (QID) | ORAL | Status: DC | PRN

## 2019-08-14 NOTE — Evaluation (Signed)
Physical Therapy Evaluation Patient Details Name: Roy [name] MRN: [id number] DOB: 1945/12/02 Today's Date: 08/14/2019   History of Present Illness  Pt is a 74 y.o. M with significant PMH of CAD, depression who was recently hospitalized for abdominal pain and found to have a bowel obstruction requiring NG tube. Pt presents now with abdominal pain, severe diarrhea, and found to have severe hypokalemia, lactic acidosis, AKI. CT abdomen showed colonic wall thicking concerning for partial colonic obstruction and diverticulitis. Pt started on IV antibiotics, potassium supplementation, IV fluids.   Clinical Impression  Patient evaluated by Physical Therapy with no further acute PT needs identified. Pt reporting good pain control; recent flexi seal removal. Pt ambulating 400 feet without an assistive device without physical difficulty. HR peak 118 bpm, SpO2 99% on RA. All education has been completed and the patient has no further questions. No follow-up Physical Therapy or equipment needs. PT is signing off. Thank you for this referral.     Follow Up Recommendations No PT follow up    Equipment Recommendations  None recommended by PT    Recommendations for Other Services       Precautions / Restrictions Precautions Precautions: None Restrictions Weight Bearing Restrictions: No      Mobility  Bed Mobility Overal bed mobility: Independent                Transfers Overall transfer level: Independent Equipment used: None                Ambulation/Gait Ambulation/Gait assistance: Supervision Gait Distance (Feet): 400 Feet Assistive device: None Gait Pattern/deviations: WFL(Within Functional Limits)     General Gait Details: Occasional drift to left but able to self correct. 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 [employer]    Prior Function Level of Independence: Independent               Journalist, newspaper        Extremity/Trunk Assessment   Upper Extremity Assessment Upper Extremity Assessment: Overall WFL for tasks assessed    Lower Extremity Assessment Lower Extremity Assessment: RLE deficits/detail;LLE deficits/detail RLE Deficits / Details: Strength 5/5 LLE Deficits / Details: Strength 5/5    Cervical / Trunk Assessment Cervical / Trunk Assessment: Normal  Communication   Communication: No difficulties  Cognition Arousal/Alertness: Awake/alert Behavior During Therapy: WFL for tasks assessed/performed Overall Cognitive Status: Within Functional Limits for tasks assessed                                        General Comments      Exercises  Assessment/Plan    PT Assessment Patent does not need any further PT services  PT Problem List         PT Treatment Interventions      PT Goals (Current goals can be found in the Care Plan section)  Acute Rehab PT Goals Patient Stated Goal: improve symptoms PT Goal Formulation: All assessment and education complete, DC therapy    Frequency     Barriers to discharge        Co-evaluation               AM-PAC PT "6 Clicks" Mobility  Outcome Measure Help needed turning from your back to your side while in a flat bed without using bedrails?: None Help needed moving from lying on your back to sitting on the side of a flat  bed without using bedrails?: None Help needed moving to and from a bed to a chair (including a wheelchair)?: None Help needed standing up from a chair using your arms (e.g., wheelchair or bedside chair)?: None Help needed to walk in hospital room?: None Help needed climbing 3-5 steps with a railing? : None 6 Click Score: 24    End of Session   Activity Tolerance: Patient tolerated treatment well Patient left: in chair;with call bell/phone within reach Nurse Communication: Mobility status PT Visit Diagnosis: Other abnormalities of gait and mobility (R26.89)    Time: 1510-1530 PT Time Calculation (min) (ACUTE ONLY): 20 min   Charges:   PT Evaluation $PT Eval Low Complexity: [place], PT, DPT Acute Rehabilitation Services Pager [phone] Office [phone]   Deno Etienne 08/14/2019, 4:56 PM

## 2019-08-14 NOTE — Progress Notes (Signed)
[facility] ROUNDING NOTE   Subjective: No acute events overnight.  Tolerating p.o. intake.  Decreased stool frequency.  Flexi-Seal still in place, but decreased output in bedside bag.  Objective: Vital signs in last 24 hours: Temp:  [98.2 F (36.8 C)-98.9 F (37.2 C)] 98.2 F (36.8 C) (08/01 0800) Pulse Rate:  [72-88] 72 (08/01 0800) Resp:  [20-21] 20 (08/01 0800) BP: (122-135)/(83-94) 122/90 (08/01 0800) SpO2:  [98 %-99 %] 99 % (08/01 0800) Last BM Date: 08/14/19 General: NAD Lungs:  CTA b/l, no w/r/r Heart:  RRR, no m/r/g Abdomen:  Soft, NT, ND, +BS Ext:  No c/c/e    Intake/Output from previous day: 07/31 0701 - 08/01 0700 In: 3650.8 [P.O.:120; I.V.:3039.5; IV Piggyback:491.3] Out: 2185 [Urine:1285; Stool:900] Intake/Output this shift: Total I/O In: 300 [P.O.:300] Out: 580 [Urine:520; Stool:60]   Lab Results: No results for input(s): WBC, HGB, PLT, MCV in the last 72 hours. BMET Recent Labs    08/12/19 0111 08/12/19 0111 08/12/19 1451 08/13/19 0138 08/14/19 0157  NA 138  --   --  140 141  K 2.4*   < > 3.1* 3.0* 3.9  CL 110  --   --  113* 116*  CO2 19*  --   --  20* 19*  GLUCOSE 93  --   --  101* 110*  BUN 17  --   --  9 9  CREATININE 0.95  --   --  0.92 0.90  CALCIUM 7.4*  --   --  7.6* 7.6*   < > = values in this interval not displayed.   LFT No results for input(s): PROT, ALBUMIN, AST, ALT, ALKPHOS, BILITOT, BILIDIR, IBILI in the last 72 hours. PT/INR No results for input(s): INR in the last 72 hours.    Imaging/Other results: No results found.    Assessment and Plan:  1) Inflammatory colitis 2) Diarrhea-improving 3) Hypokalemia-resolved 4) Hypomagnesemia 5) Abdominal pain-resolved 6) Nausea/vomiting-resolved  -Has had significant clinical improvement since starting Lialda.  Plan to continue Lialda 4.8 g/day x8 weeks, then reduce to 2.4 g/day with anticipated treatment course of 4-6 months -Discontinue Flexi-Seal -Started  Imodium yesterday.  Discussed with him today that after discharge he can titrate this to prn basis -Continue p.o. intake as tolerated -Plan for outpatient colonoscopy in 8+ weeks for both CRC screening along with evaluation for appropriate mucosal healing -Infectious work-up negative.  Antibiotics have all since been discontinued -Elevated CRP c/w inflammatory changes.  Calprotectin pending -Mag repleted earlier today -Ambulate around ward after Flexi-Seal discontinued -Anticipate discharge tomorrow morning -To follow-up with Dr. Lyndel Safe in the GI clinic -GI service will sign off at this time.  Please do not hesitate to contact additional questions or concerns    Lavena Bullion, DO  08/14/2019, 2:53 PM [facility] Pager [phone]

## 2019-08-14 NOTE — Progress Notes (Signed)
Flexi-seal discontinued per MD orders.

## 2019-08-14 NOTE — Progress Notes (Signed)
PROGRESS NOTE    Roy [name]  WLN:[id number] DOB: 02/05/1945 DOA: 08/10/2019 PCP: Jinny Sanders, MD   Brief Narrative:  Patient is a 74 year old male with history of coronary artery  disease, prediabetes, depression who was recently hospitalized for abdominal pain and was found to have a bowel obstruction requiring NG tube.  Underwent colonoscopy which did not reveal any mass but showed diverticulosis.  He was discharged after he improved.  At that time he was found to have Yersinia in his GI pathogen panel and was treated with short course of doxycycline.  After going home, he again developed abdominal pain, started having loose stools, vomiting, feeling fatigue, loss of appetite.  He then presented back to the emergency department.  On presentation, he was found to have severe hypokalemia, lactic acidosis, AKI.  CT abdomen/pelvis showed colonic wall thickening concerning for partial colonic obstruction and diverticulitis.  Patient started on IV antibiotics, potassium supplementation, IV fluids.  GI consulted.  His symptoms have improved.  Antibiotics discontinued.  Plan is to do colonoscopy as an outpatient.  Discharge planning after the resolution of diarrhea and correction of electrolytes.  Assessment & Plan:   Principal Problem:   Acute colitis Active Problems:   Coronary artery disease involving native coronary artery of native heart without angina pectoris   Hypotension   Acute renal failure (HCC)   Abnormal CT scan, colon   Lactic acid acidosis   Hypokalemia   Acute colitis/diverticulitis/severe diarrhea: Presented with abdomen pain, nausea, vomiting, lactic acidosis, AKI, hypokalemia.  Started on IV fluids, Zosyn.  GI pathogen panel, C. Difficile sent but came back negative.   Nausea and vomiting have improved.  Diarrhea has slowed down. GI consulted Elevated CPR. Pending  fecal calprotectin.  GI planning for repeat colonoscopy as an outpatient.  No obstructive changes in  the CT scan.  Patient was initially thought to have sepsis but this is less likely because his cultures are negative and the sepsis physiology have resolved with IV fluids/volume repletion.  Antibiotics discontinued. Suspected to have segmental colitis associated with diverticulosis.On lialda  AKI: Improved with IV fluids  Lactic acidosis: Secondary to hypovolemia from diarrhea.  Lactic acidosis resolved with IV fluids  Severe hypokalemia/hypomagnesemia: Being supplemented .  Will monitor electrolytes.  History of coronary artery disease: Cardiac medications on hold for now.  Stable from cardiac standpoint.  Abnormal chest x-ray: Chest x-ray showed lingular atelectasis versus infiltrate.  He does not have any respiratory symptoms at present.        DVT prophylaxis:SCD Code Status: Full Family Communication: None present at the bedside Status is: Inpatient  Remains inpatient appropriate because:IV treatments appropriate due to intensity of illness or inability to take PO   Dispo: The patient is from: Home              Anticipated d/c is to: Home              Anticipated d/c date is:1-2 days              Patient currently is not medically stable to d/c.  Still having diarrhea.  Severe electrolyte abnormalities.  Needs GI clearance before discharge.     Consultants: GI  Procedures:None  Antimicrobials:  Anti-infectives (From admission, onward)   Start     Dose/Rate Route Frequency Ordered Stop   08/11/19 0600  piperacillin-tazobactam (ZOSYN) IVPB 3.375 g  Status:  Discontinued        3.375 g 12.5 mL/hr over 240 Minutes Intravenous  Every 8 hours 08/11/19 0023 08/11/19 1129   08/10/19 2145  piperacillin-tazobactam (ZOSYN) IVPB 3.375 g        3.375 g 12.5 mL/hr over 240 Minutes Intravenous  Once 08/10/19 2140 08/11/19 0156      Subjective:  Patient seen and examined at the bedside today.  Hemodynamically stable.  Remains comfortable.  Denies any abdominal pain, nausea or  vomiting.  Diarrhea has slowed down.  There was hardly any stool in his rectal tube bag but it was just changed earlier this morning.  Patient was eager to know about his management plan.  Objective: Vitals:   08/13/19 0827 08/13/19 1714 08/13/19 2033 08/13/19 2300  BP: 123/82 (!) 135/94 (!) 131/83   Pulse:  87 88   Resp: 17 21 20 21   Temp: 97.9 F (36.6 C) 98.9 F (37.2 C) 98.9 F (37.2 C)   TempSrc: Oral Oral Oral   SpO2: 97% 98% 99%   Weight:      Height:        Intake/Output Summary (Last 24 hours) at 08/14/2019 0741 Last data filed at 08/14/2019 0615 Gross per 24 hour  Intake 3650.8 ml  Output 2185 ml  Net 1465.8 ml   Filed Weights   08/10/19 1924  Weight: 74.3 kg    Examination:   General exam: Comfortable, foul smell in the room Respiratory system: Bilateral equal air entry, normal vesicular breath sounds, no wheezes or crackles  Cardiovascular system: S1 & S2 heard, RRR. No JVD, murmurs, rubs, gallops or clicks. Gastrointestinal system: Abdomen is nondistended, soft and nontender. No organomegaly or masses felt.  Hyperactive bowel sounds. Central nervous system: Alert and oriented. No focal neurological deficits. Extremities: No edema, no clubbing ,no cyanosis Skin: No rashes, lesions or ulcers,no icterus ,no pallor    Data Reviewed: I have personally reviewed following labs and imaging studies  CBC: Recent Labs  Lab 08/10/19 1936 08/11/19 0208  WBC 10.7* 6.9  HGB 14.7 11.2*  HCT 43.9 32.4*  MCV 90.5 86.4  PLT 462* 825   Basic Metabolic Panel: Recent Labs  Lab 08/10/19 1936 08/10/19 2321 08/11/19 0208 08/11/19 0208 08/11/19 1044 08/11/19 1044 08/11/19 1530 08/12/19 0111 08/12/19 1451 08/13/19 0138 08/14/19 0157  NA   < >  --  136  --  138  --   --  138  --  140 141  K   < >  --  2.3*   < > 2.7*   < > 3.9 2.4* 3.1* 3.0* 3.9  CL   < >  --  106  --  109  --   --  110  --  113* 116*  CO2   < >  --  20*  --  18*  --   --  19*  --  20* 19*    GLUCOSE   < >  --  131*  --  121*  --   --  93  --  101* 110*  BUN   < >  --  33*  --  28*  --   --  17  --  9 9  CREATININE   < >  --  1.74*  --  1.35*  --   --  0.95  --  0.92 0.90  CALCIUM   < >  --  7.0*  --  7.3*  --   --  7.4*  --  7.6* 7.6*  MG  --  1.2*  --   --  2.3  --   --   --   --   --  1.3*   < > = values in this interval not displayed.   GFR: Estimated Creatinine Clearance: 66 mL/min (by C-G formula based on SCr of 0.9 mg/dL). Liver Function Tests: Recent Labs  Lab 08/10/19 1936 08/11/19 0208  AST 21 13*  ALT 30 23  ALKPHOS 86 58  BILITOT 0.9 0.6  PROT 6.1* 4.6*  ALBUMIN 2.7* 2.0*   Recent Labs  Lab 08/10/19 1936  LIPASE 18   No results for input(s): AMMONIA in the last 168 hours. Coagulation Profile: Recent Labs  Lab 08/10/19 2317  INR 1.2   Cardiac Enzymes: No results for input(s): CKTOTAL, CKMB, CKMBINDEX, TROPONINI in the last 168 hours. BNP (last 3 results) No results for input(s): PROBNP in the last 8760 hours. HbA1C: No results for input(s): HGBA1C in the last 72 hours. CBG: No results for input(s): GLUCAP in the last 168 hours. Lipid Profile: No results for input(s): CHOL, HDL, LDLCALC, TRIG, CHOLHDL, LDLDIRECT in the last 72 hours. Thyroid Function Tests: No results for input(s): TSH, T4TOTAL, FREET4, T3FREE, THYROIDAB in the last 72 hours. Anemia Panel: No results for input(s): VITAMINB12, FOLATE, FERRITIN, TIBC, IRON, RETICCTPCT in the last 72 hours. Sepsis Labs: Recent Labs  Lab 08/10/19 1936 08/10/19 2123 08/11/19 0208  LATICACIDVEN 6.4* 6.2* 1.1    Recent Results (from the past 240 hour(s))  Culture, blood (Routine x 2)     Status: None (Preliminary result)   Collection Time: 08/10/19  9:15 PM   Specimen: BLOOD  Result Value Ref Range Status   Specimen Description BLOOD RIGHT ARM  Final   Special Requests   Final    BOTTLES DRAWN AEROBIC AND ANAEROBIC Blood Culture results may not be optimal due to an inadequate volume of  blood received in culture bottles   Culture   Final    NO GROWTH 3 DAYS Performed at [facility], [place] [address], [place], [place] [address]    Report Status PENDING  Incomplete  Culture, blood (Routine x 2)     Status: None (Preliminary result)   Collection Time: 08/10/19  9:15 PM   Specimen: BLOOD  Result Value Ref Range Status   Specimen Description BLOOD LEFT FOREARM  Final   Special Requests   Final    BOTTLES DRAWN AEROBIC AND ANAEROBIC Blood Culture results may not be optimal due to an inadequate volume of blood received in culture bottles   Culture   Final    NO GROWTH 3 DAYS Performed at [facility], [place] [address], [place], [place] [address]    Report Status PENDING  Incomplete  SARS Coronavirus 2 by RT PCR (hospital order, performed in [facility] hospital lab) Nasopharyngeal Nasopharyngeal Swab     Status: None   Collection Time: 08/11/19 12:41 AM   Specimen: Nasopharyngeal Swab  Result Value Ref Range Status   SARS Coronavirus 2 NEGATIVE NEGATIVE Final    Comment: (NOTE) SARS-CoV-2 target nucleic acids are NOT DETECTED.  The SARS-CoV-2 RNA is generally detectable in upper and lower respiratory specimens during the acute phase of infection. The lowest concentration of SARS-CoV-2 viral copies this assay can detect is 250 copies / mL. A negative result does not preclude SARS-CoV-2 infection and should not be used as the sole basis for treatment or other patient management decisions.  A negative result may occur with improper specimen collection / handling, submission of specimen other than  nasopharyngeal swab, presence of viral mutation(s) within the areas targeted by this assay, and inadequate number of viral copies (<250 copies / mL). A negative result must be combined with clinical observations, patient history, and epidemiological information.  Fact Sheet for Patients:   StrictlyIdeas.no  Fact Sheet for Healthcare  Providers: BankingDealers.co.za  This test is not yet approved or  cleared by the [place] FDA and has been authorized for detection and/or diagnosis of SARS-CoV-2 by FDA under an Emergency Use Authorization (EUA).  This EUA will remain in effect (meaning this test can be used) for the duration of the COVID-19 declaration under Section 564(b)(1) of the Act, 21 U.S.C. section 360bbb-3(b)(1), unless the authorization is terminated or revoked sooner.  Performed at [facility], [place] [address], [place], [place] [address]   Gastrointestinal Panel by PCR , Stool     Status: None   Collection Time: 08/11/19  3:53 AM   Specimen: Stool  Result Value Ref Range Status   Campylobacter species NOT DETECTED NOT DETECTED Final   Plesimonas shigelloides NOT DETECTED NOT DETECTED Final   Salmonella species NOT DETECTED NOT DETECTED Final   Yersinia enterocolitica NOT DETECTED NOT DETECTED Final   Vibrio species NOT DETECTED NOT DETECTED Final   Vibrio cholerae NOT DETECTED NOT DETECTED Final   Enteroaggregative E coli (EAEC) NOT DETECTED NOT DETECTED Final   Enteropathogenic E coli (EPEC) NOT DETECTED NOT DETECTED Final   Enterotoxigenic E coli (ETEC) NOT DETECTED NOT DETECTED Final   Shiga like toxin producing E coli (STEC) NOT DETECTED NOT DETECTED Final   Shigella/Enteroinvasive E coli (EIEC) NOT DETECTED NOT DETECTED Final   Cryptosporidium NOT DETECTED NOT DETECTED Final   Cyclospora cayetanensis NOT DETECTED NOT DETECTED Final   Entamoeba histolytica NOT DETECTED NOT DETECTED Final   Giardia lamblia NOT DETECTED NOT DETECTED Final   Adenovirus F40/41 NOT DETECTED NOT DETECTED Final   Astrovirus NOT DETECTED NOT DETECTED Final   Norovirus GI/GII NOT DETECTED NOT DETECTED Final   Rotavirus A NOT DETECTED NOT DETECTED Final   Sapovirus (I, II, IV, and V) NOT DETECTED NOT DETECTED Final    Comment: Performed at [facility], [place]  [place], [place] [address]  C Difficile Quick Screen w PCR reflex     Status: None   Collection Time: 08/11/19  3:53 AM   Specimen: Stool  Result Value Ref Range Status   C Diff antigen NEGATIVE NEGATIVE Final   C Diff toxin NEGATIVE NEGATIVE Final   C Diff interpretation No C. difficile detected.  Final    Comment: Performed at [facility], [place] [address] [place], [place] [address]  MRSA PCR Screening     Status: None   Collection Time: 08/12/19  1:26 AM   Specimen: Nasal Mucosa; Nasopharyngeal  Result Value Ref Range Status   MRSA by PCR NEGATIVE NEGATIVE Final    Comment:        The GeneXpert MRSA Assay (FDA approved for NASAL specimens only), is one component of a comprehensive MRSA colonization surveillance program. It is not intended to diagnose MRSA infection nor to guide or monitor treatment for MRSA infections. Performed at [facility], [place] [address] [place], [place] [address]   Urine culture     Status: None   Collection Time: 08/12/19  3:00 AM   Specimen: Urine, Random  Result Value Ref Range Status   Specimen Description URINE, RANDOM  Final   Special Requests NONE  Final   Culture   Final    NO GROWTH Performed at [facility], [place] [address], [place], [place] [address]    Report Status 08/12/2019 FINAL  Final         Radiology Studies: No results found.      Scheduled Meds: . mesalamine  4.8 g Oral Q breakfast  . pantoprazole  40 mg Oral Q0600  . potassium chloride  40 mEq Oral BID  . tamsulosin  0.4 mg Oral Daily   Continuous Infusions: . 0.9 % NaCl with KCl 20 mEq / L 100 mL/hr at 08/14/19 0331  . magnesium sulfate bolus IVPB       LOS: 4 days    Time spent:35 mins. More than 50% of that time was spent in counseling and/or coordination of care.      Shelly Coss, MD [facility] P8/01/2019, 7:41 AM

## 2019-08-14 NOTE — Telephone Encounter (Signed)
Noted, msg received when I was out of the office for the past week. Pt was admitted to [facility].

## 2019-08-14 NOTE — Plan of Care (Signed)

## 2019-08-15 ENCOUNTER — Telehealth: Payer: Self-pay

## 2019-08-15 LAB — BASIC METABOLIC PANEL
Anion gap: 7 (ref 5–15)
BUN: 6 mg/dL — ABNORMAL LOW (ref 8–23)
CO2: 21 mmol/L — ABNORMAL LOW (ref 22–32)
Calcium: 7.5 mg/dL — ABNORMAL LOW (ref 8.9–10.3)
Chloride: 111 mmol/L (ref 98–111)
Creatinine, Ser: 0.74 mg/dL (ref 0.61–1.24)
GFR calc Af Amer: >60 mL/min (ref >60)
GFR calc non Af Amer: >60 mL/min (ref >60)
Glucose, Bld: 115 mg/dL — ABNORMAL HIGH (ref 70–99)
Potassium: 3 mmol/L — ABNORMAL LOW (ref 3.5–5.1)
Sodium: 139 mmol/L (ref 135–145)

## 2019-08-15 LAB — CULTURE, BLOOD (ROUTINE X 2)
Culture: NO GROWTH
Culture: NO GROWTH

## 2019-08-15 LAB — MAGNESIUM: Magnesium: 1.6 mg/dL — ABNORMAL LOW (ref 1.7–2.4)

## 2019-08-15 LAB — CALPROTECTIN, FECAL: Calprotectin, Fecal: 197 ug/g — ABNORMAL HIGH (ref 0–120)

## 2019-08-15 MED ORDER — MAGNESIUM OXIDE 400 MG PO CAPS: 400.0000 mg | ORAL_CAPSULE | Freq: Every day | ORAL | 0 refills | Status: DC

## 2019-08-15 MED ORDER — POTASSIUM CHLORIDE ER 20 MEQ PO TBCR: 20.0000 meq | EXTENDED_RELEASE_TABLET | Freq: Every day | ORAL | 0 refills | Status: DC

## 2019-08-15 MED ORDER — MESALAMINE 1.2 G PO TBEC: 4.8000 g | DELAYED_RELEASE_TABLET | Freq: Every day | ORAL | 1 refills | Status: DC

## 2019-08-15 MED ORDER — MAGNESIUM SULFATE 2 GM/50ML IV SOLN
2.0000 g | Freq: Once | INTRAVENOUS | Status: AC
  Administered 2019-08-15: 2 g via INTRAVENOUS
  Filled 2019-08-15: qty 50

## 2019-08-15 MED ORDER — LOPERAMIDE HCL 2 MG PO CAPS: 2.0000 mg | ORAL_CAPSULE | Freq: Four times a day (QID) | ORAL | 0 refills | Status: DC | PRN

## 2019-08-15 MED ORDER — TAMSULOSIN HCL 0.4 MG PO CAPS: 0.4000 mg | ORAL_CAPSULE | Freq: Every day | ORAL | 1 refills | Status: DC

## 2019-08-15 MED ORDER — POTASSIUM CHLORIDE CRYS ER 20 MEQ PO TBCR
40.0000 meq | EXTENDED_RELEASE_TABLET | ORAL | Status: AC
  Administered 2019-08-15 (×2): 40 meq via ORAL
  Filled 2019-08-15: qty 2

## 2019-08-15 NOTE — Progress Notes (Signed)
New order to discharge patient home.  PIV and cardiac monitoring discontinued.  AVS comprehensively reviewed with patient.  All medications reviewed with patient.  All appointments reviewed with patient.  All questions answered.  Patient voiced understanding of  discharge and medication instructions.  Patient calling a close friend for transportation home.

## 2019-08-15 NOTE — Discharge Summary (Signed)
Physician Discharge Summary  NIKHOLAS GEFFRE KVQ:[id number] DOB: 1945/11/19 DOA: 08/10/2019  PCP: Jinny Sanders, MD  Admit date: 08/10/2019 Discharge date: 08/15/2019  Admitted From: Home Disposition:  Home  Discharge Condition:Stable CODE STATUS:FULL Diet recommendation: Heart Healthy  Brief/Interim Summary:  Patient is a 74 year old male with history of coronary artery  disease, prediabetes, depression who was recently hospitalized for abdominal pain and was found to have a bowel obstruction requiring NG tube.  Underwent colonoscopy which did not reveal any mass but showed diverticulosis.  He was discharged after he improved.  At that time he was found to have Yersinia in his GI pathogen panel and was treated with short course of doxycycline.  After going home, he again developed abdominal pain, started having loose stools, vomiting, feeling fatigue, loss of appetite.  He then presented back to the emergency department.  On presentation, he was found to have severe hypokalemia, lactic acidosis, AKI.  CT abdomen/pelvis showed colonic wall thickening concerning for partial colonic obstruction and diverticulitis.  Patient started on IV antibiotics, potassium supplementation, IV fluids.  GI consulted.  His symptoms have improved.  Antibiotics discontinued GI pathogen panel, C. difficile, came back to be negative.  Plan is to do colonoscopy as an outpatient in 8 weeks.    GI has cleared him for discharge today.  Following problems were addressed during his hospitalization:  Acute colitis/diverticulitis/severe diarrhea: Presented with abdomen pain, nausea, vomiting, lactic acidosis, AKI, hypokalemia.  Started on IV fluids, Zosyn.  GI pathogen panel, C. Difficile sent but came back negative so abx d/ced.   Nausea and vomiting have improved.  Diarrhea has stopped now.Suspected to have segmental colitis associated with diverticulosis.On lialda Elevated CPR. Pending  fecal calprotectin.  GI planning for  repeat colonoscopy as an outpatient in 8 weeks.  No obstructive changes in the CT scan.  Patient was initially thought to have sepsis but this is less likely because his cultures are negative and the sepsis physiology have resolved with IV fluids/volume repletion.    AKI: Resolved with IV fluids  Lactic acidosis: Secondary to hypovolemia from diarrhea.  Lactic acidosis resolved with IV fluids  Severe hypokalemia/hypomagnesemia: Being supplemented .   Check BMP and magnesium in a week  History of coronary artery disease: Cardiac medications on hold for now.  Stable from cardiac standpoint.  Abnormal chest x-ray: Chest x-ray showed lingular atelectasis versus infiltrate.  He does not have any respiratory symptoms at present.   Discharge Diagnoses:  Principal Problem:   Acute colitis Active Problems:   Coronary artery disease involving native coronary artery of native heart without angina pectoris   Hypotension   Acute renal failure (HCC)   Abnormal CT scan, colon   Lactic acid acidosis   Hypokalemia    Discharge Instructions  Discharge Instructions    Diet - low sodium heart healthy   Complete by: As directed    Discharge instructions   Complete by: As directed    1)Please take prescribed medications as instructed 2)Do a BMP and Magnesium test in a week by following up with your PCP 3)Please follow up with your gastroenterologist as an outpatient   Increase activity slowly   Complete by: As directed      Allergies as of 08/15/2019   No Known Allergies     Medication List    STOP taking these medications   Magnesium 200 MG Tabs     TAKE these medications   carvedilol 12.5 MG tablet Commonly known as: COREG Take  6.25 mg by mouth 2 (two) times daily with a meal.   clopidogrel 75 MG tablet Commonly known as: PLAVIX TAKE 1 TABLET BY MOUTH EVERY DAY   dicyclomine 20 MG tablet Commonly known as: BENTYL Take 1 tablet (20 mg total) by mouth every 6 (six) hours as  needed (abdominal pain).   GLUCOSAMINE PO Take 2 tablets by mouth daily.   loperamide 2 MG capsule Commonly known as: IMODIUM Take 1 capsule (2 mg total) by mouth every 6 (six) hours as needed for diarrhea or loose stools.   Magnesium Oxide 400 MG Caps Take 1 capsule (400 mg total) by mouth daily.   mesalamine 1.2 g EC tablet Commonly known as: LIALDA Take 4 tablets (4.8 g total) by mouth daily with breakfast. Start taking on: August 16, 2019   nitroGLYCERIN 0.4 MG SL tablet Commonly known as: NITROSTAT Place 1 tablet (0.4 mg total) under the tongue every 5 (five) minutes x 3 doses as needed for chest pain.   omeprazole 20 MG capsule Commonly known as: PRILOSEC Take 1 capsule (20 mg total) by mouth daily before breakfast.   ondansetron 4 MG tablet Commonly known as: Zofran Take 1 tablet (4 mg total) by mouth every 8 (eight) hours as needed for nausea or vomiting.   Potassium Chloride ER 20 MEQ Tbcr Take 20 mEq by mouth daily. Take 2 pills (40 mEq) for a week then continue taking 20 mEq daily.Check potassium  level in a week   tamsulosin 0.4 MG Caps capsule Commonly known as: FLOMAX Take 1 capsule (0.4 mg total) by mouth daily. Start taking on: August 16, 2019   venlafaxine XR 75 MG 24 hr capsule Commonly known as: EFFEXOR-XR TAKE 1 CAPSULE (75 MG TOTAL) BY MOUTH DAILY WITH BREAKFAST.       Follow-up Information    Jinny Sanders, MD. Schedule an appointment as soon as possible for a visit in 1 week(s).   Specialty: Family Medicine Contact information: [place] [address] [phone]              No Known Allergies  Consultations:  GI   Procedures/Studies: CT ABDOMEN PELVIS WO CONTRAST  Result Date: 08/10/2019 CLINICAL DATA:  Abdominal pain and diarrhea for 1 week following colonoscopy EXAM: CT ABDOMEN AND PELVIS WITHOUT CONTRAST TECHNIQUE: Multidetector CT imaging of the abdomen and pelvis was performed following the standard  protocol without IV contrast. COMPARISON:  08/03/2019 FINDINGS: Lower chest: Calcified granuloma is noted in the left laterally. Mild scarring is noted in the right lung base stable from the prior exam. Focal herniation of fat is noted to the posteromedial left hemidiaphragm. Hepatobiliary: Gallbladder is well distended. The liver is within normal limits. Pancreas: Unremarkable. No pancreatic ductal dilatation or surrounding inflammatory changes. Spleen: Multiple calcified granulomas are noted. No other focal abnormality is seen. Adrenals/Urinary Tract: Adrenal glands are within normal limits. Kidneys are well visualize without renal calculi or obstructive change. The bladder is within normal limits. Stomach/Bowel: Colon demonstrates scattered diverticular change as well as wall thickening in the sigmoid. Proximal to this there is significant dilatation of the colon with fluid as well as some mild dilatation of distal small bowel. These changes are consistent with a partial colonic obstruction related to the wall thickening seen in the sigmoid. This is again somewhat suspicious for underlying mass although recent colonoscopy shows no definitive mass. Stomach is within normal limits. Vascular/Lymphatic: Aortic atherosclerosis. No enlarged abdominal or pelvic lymph nodes. Reproductive: Prostate is  unremarkable. Other: No abdominal wall hernia or abnormality. No abdominopelvic ascites. Musculoskeletal: No acute or significant osseous findings. IMPRESSION: Persistent wall thickening within the sigmoid with more proximal fluid retention in the colon and distal small bowel dilatation consistent with a partial colonic obstruction. These changes are likely related to diverticulitis as recent colonoscopy shows no definitive mass lesion. No abscess or perforation is noted. Prior granulomatous disease. No other focal abnormality is noted. Electronically Signed   By: Inez Catalina M.D.   On: 08/10/2019 22:34   DG Chest 2  View  Result Date: 08/10/2019 CLINICAL DATA:  74 year old male with sepsis. EXAM: CHEST - 2 VIEW COMPARISON:  Chest radiograph dated 07/23/2019. FINDINGS: Patchy area of density in the lingula may represent atelectasis. Infiltrate is not excluded clinical correlation is recommended. There is no pleural effusion or pneumothorax. Left lung base calcified granuloma. The cardiac silhouette is within limits. No acute osseous pathology. IMPRESSION: Lingular atelectasis versus infiltrate. Electronically Signed   By: Anner Crete M.D.   On: 08/10/2019 21:53   DG Abd 1 View  Result Date: 07/25/2019 CLINICAL DATA:  Bowel obstruction. EXAM: ABDOMEN - 1 VIEW COMPARISON:  The CT abdomen pelvis-07/23/2019 FINDINGS: There is moderate to marked gaseous distention of multiple loops of predominantly small bowel with index loop of small bowel in the left mid hemiabdomen measuring 3.5 cm in diameter. A small amount of air is seen within the ascending and transverse colon however otherwise series a conspicuous paucity of distal colonic gas. No supine evidence of pneumoperitoneum. No pneumatosis or portal venous gas Enteric tip and side port projected the expected location of in by the stomach Limited visualization of the lower thorax demonstrates mild elevation of the right hemidiaphragm. A punctate granuloma overlies the left lower lobe with associated partially calcified left hilar lymph nodes, the sequela of previous granulomatous infection. Excreted contrast is seen within the urinary bladder. Mild to moderate multilevel lumbar spine DDD is suspected though incompletely evaluated. IMPRESSION: 1. Findings most suggestive of small-bowel obstruction though note, a distal colonic lesion was identified on abdominal CT performed 07/23/2019. 2. Enteric tube tip and side port project over the expected location of the mid body of the stomach. Electronically Signed   By: Sandi Mariscal M.D.   On: 07/25/2019 07:08   CT CHEST W  CONTRAST  Result Date: 07/24/2019 CLINICAL DATA:  Obstructing sigmoid colon mass. Evaluate for metastatic disease. EXAM: CT CHEST WITH CONTRAST TECHNIQUE: Multidetector CT imaging of the chest was performed during intravenous contrast administration. CONTRAST:  17mL OMNIPAQUE IOHEXOL 300 MG/ML  SOLN COMPARISON:  CT abdomen/pelvis 07/23/2019 FINDINGS: Cardiovascular: The heart is normal in size. No pericardial effusion. The aorta is normal in caliber. No dissection. Minimal atherosclerotic calcification at the aortic arch. Branch vessels are patent. Advanced three-vessel coronary artery calcifications are noted. Mediastinum/Nodes: No mediastinal or hilar mass or lymphadenopathy. The esophagus is grossly normal. There is an NG tube coursing down the esophagus and into the stomach. Lungs/Pleura: No worrisome pulmonary lesions to suggest metastatic disease no acute pulmonary findings. No pleural effusions. Streaky right basilar atelectasis is noted. Upper Abdomen: Persistent dilated small bowel and colon. No free air. The gallbladder is mildly distended. No hepatic lesions are identified. Musculoskeletal: No significant bony findings. IMPRESSION: 1. No CT findings for pulmonary metastatic disease. 2. No mediastinal or hilar mass or adenopathy. 3. Advanced three-vessel coronary artery calcifications. 4. Persistent dilated small bowel and colon. 5. Aortic atherosclerosis. Aortic Atherosclerosis (ICD10-I70.0). Electronically Signed   By: Marijo Sanes  M.D.   On: 07/24/2019 13:06   CT ABDOMEN PELVIS W CONTRAST  Result Date: 07/26/2019 CLINICAL DATA:  Sigmoid mass, bowel obstruction EXAM: CT ABDOMEN AND PELVIS WITH CONTRAST TECHNIQUE: Multidetector CT imaging of the abdomen and pelvis was performed using the standard protocol following bolus administration of intravenous contrast. CONTRAST:  153mL OMNIPAQUE IOHEXOL 300 MG/ML  SOLN COMPARISON:  07/23/2019 FINDINGS: Lower chest: Right basilar atelectasis. Tiny left  congenital diaphragmatic hernia with herniation of a small amount of retroperitoneal fat. Nasogastric tube is seen extending into the mid to distal body of the stomach. Right coronary artery calcification and probable coronary artery stenting has been performed. Global cardiac size within normal limits. Hepatobiliary: Tiny hypodensity within the right hepatic dome, axial image # 8/3, is again seen and likely represents a tiny a Paddock cyst, though is too small to definitively characterize. The liver is otherwise unremarkable. The gallbladder is unremarkable. Pancreas: Unremarkable Spleen: Unremarkable Adrenals/Urinary Tract: The adrenal glands are unremarkable. The kidneys are unremarkable. The bladder is largely decompressed and is unremarkable. Stomach/Bowel: The previously noted sigmoid mass is not well appreciated on this examination. There is, however, interval development of extensive circumferential bowel wall thickening and mucosal fold thickening involving the entire colon proximal to the point of obstruction within the sigmoid colon in keeping with an infectious or inflammatory colitis. There is normal enhancement of the colon. No pneumatosis or free intraperitoneal gas. The colon is largely decompressed. Similarly, there has been interval improvement in the caliber of the small bowel when compared to prior examination in keeping with improvement in large bowel obstruction, likely related to nasogastric decompression. No free intraperitoneal fluid. Appendix normal. Vascular/Lymphatic: There is no pathologic adenopathy within the abdomen and pelvis. The abdominal vasculature is age-appropriate with moderate aortoiliac atherosclerotic calcification. No aneurysm. Reproductive: Unremarkable Other: The rectum is unremarkable. Musculoskeletal: No lytic or blastic bone lesions are seen. Osseous structures are age-appropriate. IMPRESSION: Interval improvement in caliber of small bowel and decompression of the  colon in keeping with near resolution of previously noted large bowel obstruction, likely related to nasogastric decompression. Interval development of a infectious or inflammatory colitis involving the entire colon proximal to the point of sigmoid obstruction. The obstructing mass is not well delineated on this examination. Tiny indeterminate lesion within the right hepatic dome likely represents a tiny cyst, the can be reassessed on subsequent examination. Aortic Atherosclerosis (ICD10-I70.0). Electronically Signed   By: Fidela Salisbury MD   On: 07/26/2019 21:35   CT ABDOMEN PELVIS W CONTRAST  Result Date: 07/23/2019 CLINICAL DATA:  Abdominal distension and back pain.  Bloody stools. EXAM: CT ABDOMEN AND PELVIS WITH CONTRAST TECHNIQUE: Multidetector CT imaging of the abdomen and pelvis was performed using the standard protocol following bolus administration of intravenous contrast. CONTRAST:  82mL OMNIPAQUE IOHEXOL 300 MG/ML  SOLN COMPARISON:  None. FINDINGS: Lower chest: Streaky right basilar scarring changes are noted. There is a calcified granuloma noted at the left lung base. No worrisome pulmonary lesions or pleural effusion. The heart is normal in size. No pericardial effusion. Coronary artery calcifications are noted. The distal esophagus is grossly normal. Hepatobiliary: Very small low-attenuation lesion at the hepatic dome is likely a benign cyst. No worrisome hepatic lesions are identified. The gallbladder is unremarkable. No common bile duct dilatation. Pancreas: No mass, inflammation or ductal dilatation. Spleen: Normal size. A few small scattered calcified granulomas are noted. Adrenals/Urinary Tract: The adrenal glands are normal. No renal lesions or hydronephrosis.  Bladder is unremarkable. Stomach/Bowel: The stomach is  mildly distended with fluid and air. The small bowel is dilated and demonstrates scattered air-fluid levels. The colon is also dilated with fluid and air down to an obstructing  enhancing lesion in the upper sigmoid colon near the sigmoid colon descending colon junction region. This measures approximately 5 cm and is highly suspicious for colonic neoplasm causing low colonic obstruction. Recommend surgical consultation. Vascular/Lymphatic: Advanced atherosclerotic calcifications involving the aorta and iliac arteries. No aneurysm. The branch vessels are patent. The major venous structures are patent. There are small scattered subcentimeter retroperitoneal lymph nodes. No mass or overt adenopathy. No pelvic adenopathy. I do not see any enlarged lymph nodes in the sigmoid mesocolon or in the pelvis to suggest locoregional adenopathy. Reproductive: The prostate gland and seminal vesicles are unremarkable. Other: No free pelvic fluid collections. No inguinal mass or adenopathy. Musculoskeletal: No significant bony findings. IMPRESSION: 1. 5 cm obstructing enhancing lesion in the upper sigmoid colon near the sigmoid colon descending colon junction region worrisome for colonic neoplasm causing low colonic obstruction. Fairly dilated colon and small bowel above the lesion. Recommend surgical consultation. 2. No findings for locoregional adenopathy or distant metastatic disease. 3. Advanced atherosclerotic calcifications involving the aorta and iliac arteries. 4. Aortic atherosclerosis. Aortic Atherosclerosis (ICD10-I70.0). Electronically Signed   By: Marijo Sanes M.D.   On: 07/23/2019 13:47   DG Chest Port 1 View  Result Date: 07/23/2019 CLINICAL DATA:  Patient arrived by [facility] complaining of lower back pain radiating through to abdomen x 1 month. States that he has had increased gas and dark stools for several weeks. Has just developed nausea and vomiting x 1 day. Alert EXAM: PORTABLE CHEST - 1 VIEW COMPARISON:  02/10/2012 FINDINGS: Linear scarring or subsegmental atelectasis in the right mid lung. Stable calcified granuloma, left lower lung. No evidence of edema. Heart size and mediastinal  contours are within normal limits. No effusion.  No pneumothorax. Visualized bones unremarkable. IMPRESSION: New linear scarring or atelectasis, right mid lung. Otherwise negative. Electronically Signed   By: Lucrezia Europe M.D.   On: 07/23/2019 12:21   DG Abd Portable 1V  Result Date: 07/26/2019 CLINICAL DATA:  Bowel obstruction EXAM: PORTABLE ABDOMEN - 1 VIEW COMPARISON:  July 25, 2019 abdominal radiograph and CT abdomen and pelvis July 23, 2019 FINDINGS: Nasogastric tube tip and side port in stomach. Multiple loops of dilated bowel remain with bowel dilatation of up to 4.1 cm. No significant colonic dilatation seen currently. No appreciable free air. No abnormal calcifications. IMPRESSION: Nasogastric tube tip and side port in stomach. There is persistent predominantly small bowel dilatation, felt to represent a degree of bowel obstruction. No free air appreciable on supine examination. Electronically Signed   By: Lowella Grip III M.D.   On: 07/26/2019 08:07   ECHOCARDIOGRAM COMPLETE  Result Date: 07/25/2019    ECHOCARDIOGRAM REPORT   Patient Name:   Roy [name] Date of Exam: 07/25/2019 Medical Rec #:  [id number]          Height:       66.0 in Accession #:    [id number]         Weight:       167.8 lb Date of Birth:  1945-11-02          BSA:          1.856 m Patient Age:    71 years           BP:           122/81 mmHg  Patient Gender: M                  HR:           78 bpm. Exam Location:  Inpatient Procedure: 2D Echo, Color Doppler and Cardiac Doppler Indications:    Pre-Op Evaluation Z01.810  History:        Patient has prior history of Echocardiogram examinations, most                 recent 04/19/2015. Risk Factors:Hypertension, Diabetes,                 Dyslipidemia and Sleep Apnea.  Sonographer:    Raquel Sarna Senior RDCS Referring Phys: [id number] [place]  1. Left ventricular ejection fraction, by estimation, is 70 to 75%. The left ventricle has hyperdynamic function. The left  ventricle has no regional wall motion abnormalities. Left ventricular diastolic parameters are consistent with Grade I diastolic dysfunction (impaired relaxation).  2. Right ventricular systolic function is normal. The right ventricular size is normal. There is normal pulmonary artery systolic pressure.  3. The mitral valve is normal in structure. Trivial mitral valve regurgitation. No evidence of mitral stenosis.  4. The aortic valve is tricuspid. Aortic valve regurgitation is not visualized. No aortic stenosis is present.  5. The inferior vena cava is normal in size with greater than 50% respiratory variability, suggesting right atrial pressure of 3 mmHg. FINDINGS  Left Ventricle: Left ventricular ejection fraction, by estimation, is 70 to 75%. The left ventricle has hyperdynamic function. The left ventricle has no regional wall motion abnormalities. The left ventricular internal cavity size was normal in size. There is no left ventricular hypertrophy. Left ventricular diastolic parameters are consistent with Grade I diastolic dysfunction (impaired relaxation). Right Ventricle: The right ventricular size is normal.Right ventricular systolic function is normal. There is normal pulmonary artery systolic pressure. The tricuspid regurgitant velocity is 2.35 m/s, and with an assumed right atrial pressure of 3 mmHg, the estimated right ventricular systolic pressure is 43.3 mmHg. Left Atrium: Left atrial size was normal in size. Right Atrium: Right atrial size was normal in size. Pericardium: There is no evidence of pericardial effusion. Mitral Valve: The mitral valve is normal in structure. Normal mobility of the mitral valve leaflets. Trivial mitral valve regurgitation. No evidence of mitral valve stenosis. Tricuspid Valve: The tricuspid valve is normal in structure. Tricuspid valve regurgitation is trivial. No evidence of tricuspid stenosis. Aortic Valve: The aortic valve is tricuspid. Aortic valve regurgitation is  not visualized. No aortic stenosis is present. Pulmonic Valve: The pulmonic valve was not well visualized. Pulmonic valve regurgitation is not visualized. No evidence of pulmonic stenosis. Aorta: The aortic root is normal in size and structure. Venous: The inferior vena cava is normal in size with greater than 50% respiratory variability, suggesting right atrial pressure of 3 mmHg. IAS/Shunts: No atrial level shunt detected by color flow Doppler.  LEFT VENTRICLE PLAX 2D LVIDd:         3.14 cm  Diastology LVIDs:         1.99 cm  LV e' lateral:   8.81 cm/s LV PW:         1.10 cm  LV E/e' lateral: 6.6 LV IVS:        1.15 cm  LV e' medial:    6.64 cm/s LVOT diam:     2.10 cm  LV E/e' medial:  8.8 LV SV:  60 LV SV Index:   32 LVOT Area:     3.46 cm  RIGHT VENTRICLE RV S prime:     14.90 cm/s TAPSE (M-mode): 2.2 cm LEFT ATRIUM             Index       RIGHT ATRIUM           Index LA diam:        3.70 cm 1.99 cm/m  RA Area:     16.10 cm LA Vol (A2C):   43.8 ml 23.60 ml/m RA Volume:   36.50 ml  19.67 ml/m LA Vol (A4C):   50.2 ml 27.05 ml/m LA Biplane Vol: 48.3 ml 26.02 ml/m  AORTIC VALVE LVOT Vmax:   86.50 cm/s LVOT Vmean:  56.500 cm/s LVOT VTI:    0.174 m  AORTA Ao Root diam: 3.50 cm MITRAL VALVE               TRICUSPID VALVE MV Area (PHT): 3.27 cm    TR Peak grad:   22.1 mmHg MV Decel Time: 232 msec    TR Vmax:        235.00 cm/s MV E velocity: 58.20 cm/s MV A velocity: 85.40 cm/s  SHUNTS MV E/A ratio:  0.68        Systemic VTI:  0.17 m                            Systemic Diam: 2.10 cm Kirk Ruths MD Electronically signed by Kirk Ruths MD Signature Date/Time: 07/25/2019/3:40:46 PM    Final    [place] EKG SITE RITE  Result Date: 07/25/2019 If Site Rite image not attached, placement could not be confirmed due to current cardiac rhythm.  [place] EKG SITE RITE  Result Date: 07/24/2019 If Site Rite image not attached, placement could not be confirmed due to current cardiac rhythm.  CT Angio Abd/Pel w/  and/or w/o  Result Date: 08/03/2019 CLINICAL DATA:  74 year old male with generalized abdominal pain x3 weeks. EXAM: CTA ABDOMEN AND PELVIS WITHOUT AND WITH CONTRAST TECHNIQUE: Multidetector CT imaging of the abdomen and pelvis was performed using the standard protocol during bolus administration of intravenous contrast. Multiplanar reconstructed images and MIPs were obtained and reviewed to evaluate the vascular anatomy. CONTRAST:  170mL OMNIPAQUE IOHEXOL 350 MG/ML SOLN COMPARISON:  CT abdomen pelvis dated 07/26/2019. CT of the abdomen pelvis dated 07/23/2019. FINDINGS: VASCULAR Aorta: Advanced atherosclerotic calcification of the abdominal aorta. No aneurysmal dilatation or dissection. No periaortic fluid or inflammation. Celiac: There is atherosclerotic calcification of the origin the celiac artery. 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 The major branches of the celiac artery are patent. SMA: Patent without evidence of aneurysm, dissection, vasculitis or significant stenosis. Renals: The renal arteries are patent. 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 IMA: Patent without evidence of aneurysm, dissection, vasculitis or significant stenosis. Inflow: Moderate atherosclerotic calcification of the iliac arteries. The iliac arteries are patent. No aneurysm or dissection. Proximal Outflow: Bilateral common femoral and visualized portions of the superficial and profunda femoral arteries are patent without evidence of aneurysm, dissection, vasculitis or significant stenosis. Veins: The IVC is unremarkable. The SMV, splenic vein, and main portal vein are patent. No portal venous gas. Review of the MIP images confirms the above findings. NON-VASCULAR Lower chest: Subcentimeter left lung base calcified granuloma. Minimal right lung base atelectasis/scarring. Advanced coronary vascular calcification of the  LAD and RCA. Left hilar calcified granuloma. No intra-abdominal free air  or free fluid. Hepatobiliary: The liver is unremarkable. Subcentimeter right hepatic hypodense focus is too small to characterize. No intrahepatic biliary ductal dilatation. The gallbladder is unremarkable. Pancreas: Unremarkable. No pancreatic ductal dilatation or surrounding inflammatory changes. Spleen: Normal in size without focal abnormality. Adrenals/Urinary Tract: The adrenal glands unremarkable. There is no hydronephrosis on either side. There is symmetric enhancement the kidneys. The visualized ureters appear unremarkable. The urinary bladder is collapsed. Diffuse thickened appearance of the bladder wall may be related to underdistention. Cystitis is not excluded. Clinical correlation is recommended. Stomach/Bowel: Sigmoid mass involving approximately 6 cm length of the sigmoid colon with associated luminal narrowing as described on the prior CT. There is diffuse inflammatory changes and thickening of the colon most consistent with colitis. There has been interval improvement and resolution of previously seen bowel dilatation. There are scattered colonic diverticula without active inflammatory changes. No small bowel obstruction. The appendix is normal. Lymphatic: No adenopathy. Reproductive: The prostate and seminal vesicles are grossly unremarkable. Other: None Musculoskeletal: No acute or significant osseous findings. IMPRESSION: 1. Colitis. Correlation with clinical exam and stool cultures recommended. 2. Sigmoid mass with associated luminal narrowing as described on the prior CT. There has been interval improvement and resolution of previously seen bowel dilatation. No bowel obstruction. Normal appendix. 3. No aortic aneurysm or dissection. 4. 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 5. Aortic Atherosclerosis (ICD10-I70.0). Electronically Signed   By: Anner Crete M.D.   On: 08/03/2019 18:13       Subjective: Patient seen and examined at the bedside today.   Hemodynamically stable for discharge.  His abdominal pain, diarrhea have resolved.    Discharge Exam: Vitals:   08/14/19 2115 08/15/19 0810  BP: (!) 138/84 (!) 147/94  Pulse: 100   Resp: 18   Temp: 98 F (36.7 C) 98.4 F (36.9 C)  SpO2: 100%    Vitals:   08/13/19 2300 08/14/19 0800 08/14/19 2115 08/15/19 0810  BP:  (!) 122/90 (!) 138/84 (!) 147/94  Pulse:  72 100   Resp: 21 20 18    Temp:  98.2 F (36.8 C) 98 F (36.7 C) 98.4 F (36.9 C)  TempSrc:  Oral Oral Oral  SpO2:  99% 100%   Weight:      Height:        General: Pt is alert, awake, not in acute distress Cardiovascular: RRR, S1/S2 +, no rubs, no gallops Respiratory: CTA bilaterally, no wheezing, no rhonchi Abdominal: Soft, NT, ND, bowel sounds + Extremities: no edema, no cyanosis    The results of significant diagnostics from this hospitalization (including imaging, microbiology, ancillary and laboratory) are listed below for reference.     Microbiology: Recent Results (from the past 240 hour(s))  Culture, blood (Routine x 2)     Status: None (Preliminary result)   Collection Time: 08/10/19  9:15 PM   Specimen: BLOOD  Result Value Ref Range Status   Specimen Description BLOOD RIGHT ARM  Final   Special Requests   Final    BOTTLES DRAWN AEROBIC AND ANAEROBIC Blood Culture results may not be optimal due to an inadequate volume of blood received in culture bottles   Culture   Final    NO GROWTH 4 DAYS Performed at [facility], [place] [address] [place], [place] [address]    Report Status PENDING  Incomplete  Culture, blood (Routine x 2)  Status: None (Preliminary result)   Collection Time: 08/10/19  9:15 PM   Specimen: BLOOD  Result Value Ref Range Status   Specimen Description BLOOD LEFT FOREARM  Final   Special Requests   Final    BOTTLES DRAWN AEROBIC AND ANAEROBIC Blood Culture results may not be optimal due to an inadequate volume of blood received in culture bottles   Culture   Final    NO  GROWTH 4 DAYS Performed at [facility], [place] [address], [place], [place] [address]    Report Status PENDING  Incomplete  SARS Coronavirus 2 by RT PCR (hospital order, performed in [facility] hospital lab) Nasopharyngeal Nasopharyngeal Swab     Status: None   Collection Time: 08/11/19 12:41 AM   Specimen: Nasopharyngeal Swab  Result Value Ref Range Status   SARS Coronavirus 2 NEGATIVE NEGATIVE Final    Comment: (NOTE) SARS-CoV-2 target nucleic acids are NOT DETECTED.  The SARS-CoV-2 RNA is generally detectable in upper and lower respiratory specimens during the acute phase of infection. The lowest concentration of SARS-CoV-2 viral copies this assay can detect is 250 copies / mL. A negative result does not preclude SARS-CoV-2 infection and should not be used as the sole basis for treatment or other patient management decisions.  A negative result may occur with improper specimen collection / handling, submission of specimen other than nasopharyngeal swab, presence of viral mutation(s) within the areas targeted by this assay, and inadequate number of viral copies (<250 copies / mL). A negative result must be combined with clinical observations, patient history, and epidemiological information.  Fact Sheet for Patients:   StrictlyIdeas.no  Fact Sheet for Healthcare Providers: BankingDealers.co.za  This test is not yet approved or  cleared by the [place] FDA and has been authorized for detection and/or diagnosis of SARS-CoV-2 by FDA under an Emergency Use Authorization (EUA).  This EUA will remain in effect (meaning this test can be used) for the duration of the COVID-19 declaration under Section 564(b)(1) of the Act, 21 U.S.C. section 360bbb-3(b)(1), unless the authorization is terminated or revoked sooner.  Performed at [facility], [place] [address], [place], [place] [address]   Gastrointestinal Panel by PCR ,  Stool     Status: None   Collection Time: 08/11/19  3:53 AM   Specimen: Stool  Result Value Ref Range Status   Campylobacter species NOT DETECTED NOT DETECTED Final   Plesimonas shigelloides NOT DETECTED NOT DETECTED Final   Salmonella species NOT DETECTED NOT DETECTED Final   Yersinia enterocolitica NOT DETECTED NOT DETECTED Final   Vibrio species NOT DETECTED NOT DETECTED Final   Vibrio cholerae NOT DETECTED NOT DETECTED Final   Enteroaggregative E coli (EAEC) NOT DETECTED NOT DETECTED Final   Enteropathogenic E coli (EPEC) NOT DETECTED NOT DETECTED Final   Enterotoxigenic E coli (ETEC) NOT DETECTED NOT DETECTED Final   Shiga like toxin producing E coli (STEC) NOT DETECTED NOT DETECTED Final   Shigella/Enteroinvasive E coli (EIEC) NOT DETECTED NOT DETECTED Final   Cryptosporidium NOT DETECTED NOT DETECTED Final   Cyclospora cayetanensis NOT DETECTED NOT DETECTED Final   Entamoeba histolytica NOT DETECTED NOT DETECTED Final   Giardia lamblia NOT DETECTED NOT DETECTED Final   Adenovirus F40/41 NOT DETECTED NOT DETECTED Final   Astrovirus NOT DETECTED NOT DETECTED Final   Norovirus GI/GII NOT DETECTED NOT DETECTED Final   Rotavirus A NOT DETECTED NOT DETECTED Final   Sapovirus (I, II, IV, and V) NOT DETECTED NOT  DETECTED Final    Comment: Performed at [facility], [place], [place] [address]  C Difficile Quick Screen w PCR reflex     Status: None   Collection Time: 08/11/19  3:53 AM   Specimen: Stool  Result Value Ref Range Status   C Diff antigen NEGATIVE NEGATIVE Final   C Diff toxin NEGATIVE NEGATIVE Final   C Diff interpretation No C. difficile detected.  Final    Comment: Performed at [facility], [place] [address], [place], [place] [address]  MRSA PCR Screening     Status: None   Collection Time: 08/12/19  1:26 AM   Specimen: Nasal Mucosa; Nasopharyngeal  Result Value Ref Range Status   MRSA by PCR NEGATIVE NEGATIVE Final    Comment:         The GeneXpert MRSA Assay (FDA approved for NASAL specimens only), is one component of a comprehensive MRSA colonization surveillance program. It is not intended to diagnose MRSA infection nor to guide or monitor treatment for MRSA infections. Performed at [facility], [place] [address], [place], [place] [address]   Urine culture     Status: None   Collection Time: 08/12/19  3:00 AM   Specimen: Urine, Random  Result Value Ref Range Status   Specimen Description URINE, RANDOM  Final   Special Requests NONE  Final   Culture   Final    NO GROWTH Performed at [facility], [place] [address], [place], [place] [address]    Report Status 08/12/2019 FINAL  Final     Labs: BNP (last 3 results) Recent Labs    07/25/19 0246 07/26/19 0226  BNP 54.0 78.6   Basic Metabolic Panel: Recent Labs  Lab 08/10/19 2321 08/11/19 0208 08/11/19 1044 08/11/19 1530 08/12/19 0111 08/12/19 1451 08/13/19 0138 08/14/19 0157 08/15/19 0110  NA  --    < > 138  --  138  --  140 141 139  K  --    < > 2.7*   < > 2.4* 3.1* 3.0* 3.9 3.0*  CL  --    < > 109  --  110  --  113* 116* 111  CO2  --    < > 18*  --  19*  --  20* 19* 21*  GLUCOSE  --    < > 121*  --  93  --  101* 110* 115*  BUN  --    < > 28*  --  17  --  9 9 6*  CREATININE  --    < > 1.35*  --  0.95  --  0.92 0.90 0.74  CALCIUM  --    < > 7.3*  --  7.4*  --  7.6* 7.6* 7.5*  MG 1.2*  --  2.3  --   --   --   --  1.3* 1.6*   < > = values in this interval not displayed.   Liver Function Tests: Recent Labs  Lab 08/10/19 1936 08/11/19 0208  AST 21 13*  ALT 30 23  ALKPHOS 86 58  BILITOT 0.9 0.6  PROT 6.1* 4.6*  ALBUMIN 2.7* 2.0*   Recent Labs  Lab 08/10/19 1936  LIPASE 18   No results for input(s): AMMONIA in the last 168 hours. CBC: Recent Labs  Lab 08/10/19 1936 08/11/19 0208  WBC 10.7* 6.9  HGB 14.7 11.2*  HCT 43.9 32.4*  MCV 90.5 86.4  PLT 462* 245  Cardiac Enzymes: No results for input(s): CKTOTAL, CKMB,  CKMBINDEX, TROPONINI in the last 168 hours. BNP: Invalid input(s): POCBNP CBG: No results for input(s): GLUCAP in the last 168 hours. D-Dimer No results for input(s): DDIMER in the last 72 hours. Hgb A1c No results for input(s): HGBA1C in the last 72 hours. Lipid Profile No results for input(s): CHOL, HDL, LDLCALC, TRIG, CHOLHDL, LDLDIRECT in the last 72 hours. Thyroid function studies No results for input(s): TSH, T4TOTAL, T3FREE, THYROIDAB in the last 72 hours.  Invalid input(s): FREET3 Anemia work up No results for input(s): VITAMINB12, FOLATE, FERRITIN, TIBC, IRON, RETICCTPCT in the last 72 hours. Urinalysis    Component Value Date/Time   COLORURINE YELLOW 08/12/2019 0300   APPEARANCEUR CLEAR 08/12/2019 0300   LABSPEC 1.017 08/12/2019 0300   PHURINE 5.0 08/12/2019 0300   GLUCOSEU NEGATIVE 08/12/2019 0300   GLUCOSEU NEGATIVE 12/08/2005 0741   HGBUR NEGATIVE 08/12/2019 0300   BILIRUBINUR NEGATIVE 08/12/2019 0300   KETONESUR 5 (A) 08/12/2019 0300   PROTEINUR NEGATIVE 08/12/2019 0300   UROBILINOGEN 0.2 mg/dL 12/08/2005 0741   NITRITE NEGATIVE 08/12/2019 0300   LEUKOCYTESUR NEGATIVE 08/12/2019 0300   Sepsis Labs Invalid input(s): PROCALCITONIN,  WBC,  LACTICIDVEN Microbiology Recent Results (from the past 240 hour(s))  Culture, blood (Routine x 2)     Status: None (Preliminary result)   Collection Time: 08/10/19  9:15 PM   Specimen: BLOOD  Result Value Ref Range Status   Specimen Description BLOOD RIGHT ARM  Final   Special Requests   Final    BOTTLES DRAWN AEROBIC AND ANAEROBIC Blood Culture results may not be optimal due to an inadequate volume of blood received in culture bottles   Culture   Final    NO GROWTH 4 DAYS Performed at [facility], [place] [address] [place], [place] [address]    Report Status PENDING  Incomplete  Culture, blood (Routine x 2)     Status: None (Preliminary result)   Collection Time: 08/10/19  9:15 PM   Specimen: BLOOD  Result Value  Ref Range Status   Specimen Description BLOOD LEFT FOREARM  Final   Special Requests   Final    BOTTLES DRAWN AEROBIC AND ANAEROBIC Blood Culture results may not be optimal due to an inadequate volume of blood received in culture bottles   Culture   Final    NO GROWTH 4 DAYS Performed at [facility], [place] [address] [place], [place] [address]    Report Status PENDING  Incomplete  SARS Coronavirus 2 by RT PCR (hospital order, performed in [facility] hospital lab) Nasopharyngeal Nasopharyngeal Swab     Status: None   Collection Time: 08/11/19 12:41 AM   Specimen: Nasopharyngeal Swab  Result Value Ref Range Status   SARS Coronavirus 2 NEGATIVE NEGATIVE Final    Comment: (NOTE) SARS-CoV-2 target nucleic acids are NOT DETECTED.  The SARS-CoV-2 RNA is generally detectable in upper and lower respiratory specimens during the acute phase of infection. The lowest concentration of SARS-CoV-2 viral copies this assay can detect is 250 copies / mL. A negative result does not preclude SARS-CoV-2 infection and should not be used as the sole basis for treatment or other patient management decisions.  A negative result may occur with improper specimen collection / handling, submission of specimen other than nasopharyngeal swab, presence of viral mutation(s) within the areas targeted by this assay, and inadequate number of viral copies (<250 copies / mL). A negative result must be combined with  clinical observations, patient history, and epidemiological information.  Fact Sheet for Patients:   StrictlyIdeas.no  Fact Sheet for Healthcare Providers: BankingDealers.co.za  This test is not yet approved or  cleared by the [place] FDA and has been authorized for detection and/or diagnosis of SARS-CoV-2 by FDA under an Emergency Use Authorization (EUA).  This EUA will remain in effect (meaning this test can be used) for the duration of  the COVID-19 declaration under Section 564(b)(1) of the Act, 21 U.S.C. section 360bbb-3(b)(1), unless the authorization is terminated or revoked sooner.  Performed at [facility], [place] [address] [place], [place] [address]   Gastrointestinal Panel by PCR , Stool     Status: None   Collection Time: 08/11/19  3:53 AM   Specimen: Stool  Result Value Ref Range Status   Campylobacter species NOT DETECTED NOT DETECTED Final   Plesimonas shigelloides NOT DETECTED NOT DETECTED Final   Salmonella species NOT DETECTED NOT DETECTED Final   Yersinia enterocolitica NOT DETECTED NOT DETECTED Final   Vibrio species NOT DETECTED NOT DETECTED Final   Vibrio cholerae NOT DETECTED NOT DETECTED Final   Enteroaggregative E coli (EAEC) NOT DETECTED NOT DETECTED Final   Enteropathogenic E coli (EPEC) NOT DETECTED NOT DETECTED Final   Enterotoxigenic E coli (ETEC) NOT DETECTED NOT DETECTED Final   Shiga like toxin producing E coli (STEC) NOT DETECTED NOT DETECTED Final   Shigella/Enteroinvasive E coli (EIEC) NOT DETECTED NOT DETECTED Final   Cryptosporidium NOT DETECTED NOT DETECTED Final   Cyclospora cayetanensis NOT DETECTED NOT DETECTED Final   Entamoeba histolytica NOT DETECTED NOT DETECTED Final   Giardia lamblia NOT DETECTED NOT DETECTED Final   Adenovirus F40/41 NOT DETECTED NOT DETECTED Final   Astrovirus NOT DETECTED NOT DETECTED Final   Norovirus GI/GII NOT DETECTED NOT DETECTED Final   Rotavirus A NOT DETECTED NOT DETECTED Final   Sapovirus (I, II, IV, and V) NOT DETECTED NOT DETECTED Final    Comment: Performed at [facility], [place] [place], [place] [address]  C Difficile Quick Screen w PCR reflex     Status: None   Collection Time: 08/11/19  3:53 AM   Specimen: Stool  Result Value Ref Range Status   C Diff antigen NEGATIVE NEGATIVE Final   C Diff toxin NEGATIVE NEGATIVE Final   C Diff interpretation No C. difficile detected.  Final    Comment: Performed at  [facility], [place] [address] [place], [place] [address]  MRSA PCR Screening     Status: None   Collection Time: 08/12/19  1:26 AM   Specimen: Nasal Mucosa; Nasopharyngeal  Result Value Ref Range Status   MRSA by PCR NEGATIVE NEGATIVE Final    Comment:        The GeneXpert MRSA Assay (FDA approved for NASAL specimens only), is one component of a comprehensive MRSA colonization surveillance program. It is not intended to diagnose MRSA infection nor to guide or monitor treatment for MRSA infections. Performed at [facility], [place] [address], [place], [place] [address]   Urine culture     Status: None   Collection Time: 08/12/19  3:00 AM   Specimen: Urine, Random  Result Value Ref Range Status   Specimen Description URINE, RANDOM  Final   Special Requests NONE  Final   Culture   Final    NO GROWTH Performed at [facility], [place] [address] [place], [place] [address]    Report Status  08/12/2019 FINAL  Final    Please note: You were cared for by a hospitalist during your hospital stay. Once you are discharged, your primary care physician will handle any further medical issues. Please note that NO REFILLS for any discharge medications will be authorized once you are discharged, as it is imperative that you return to your primary care physician (or establish a relationship with a primary care physician if you do not have one) for your post hospital discharge needs so that they can reassess your need for medications and monitor your lab values.    Time coordinating discharge: 40 minutes  SIGNED:   Shelly Coss, MD  [facility] 08/15/2019, 11:11 AM Pager [phone]  If 7PM-7AM, please contact night-coverage [email or web address] Password [email or web address]

## 2019-08-15 NOTE — Telephone Encounter (Signed)
1st attempt- Left message on voicemail to return call- need to complete TCM and schedule hospital follow up visit.  

## 2019-08-16 NOTE — Telephone Encounter (Signed)
Transition Care Management Follow-up Telephone Call  Date of discharge and from where: 08/15/2019, [name]  How have you been since you were released from the hospital? Patient is doing fine since discharge from hospital.   Any questions or concerns? No   Items Reviewed:  Did the pt receive and understand the discharge instructions provided? Yes   Medications obtained and verified? Yes   Any new allergies since your discharge? No   Dietary orders reviewed? Yes  Do you have support at home? Yes   Functional Questionnaire: (I = Independent and D = Dependent) ADLs: I  Bathing/Dressing- I  Meal Prep- I  Eating- I  Maintaining continence- I  Transferring/Ambulation- I  Managing Meds- I  Follow up appointments reviewed:   PCP Hospital f/u appt confirmed? Yes  Scheduled to see Dr. Diona Browner on 08/23/2019 @ 3:40 pm.  [facility] f/u appt confirmed? N/A   Are transportation arrangements needed? No   If their condition worsens, is the pt aware to call PCP or go to the Emergency Dept.? Yes  Was the patient provided with contact information for the PCP's office or ED? Yes  Was to pt encouraged to call back with questions or concerns? Yes

## 2019-08-18 ENCOUNTER — Encounter: Payer: Self-pay | Admitting: Gastroenterology

## 2019-08-18 ENCOUNTER — Other Ambulatory Visit: Payer: Self-pay

## 2019-08-18 ENCOUNTER — Ambulatory Visit (INDEPENDENT_AMBULATORY_CARE_PROVIDER_SITE_OTHER): Payer: Medicare HMO | Admitting: Gastroenterology

## 2019-08-18 ENCOUNTER — Telehealth: Payer: Self-pay

## 2019-08-18 VITALS — BP 98/64 | HR 89 | Ht 66.0 in | Wt 164.5 lb

## 2019-08-18 DIAGNOSIS — K529 Noninfective gastroenteritis and colitis, unspecified: Secondary | ICD-10-CM | POA: Diagnosis not present

## 2019-08-18 NOTE — Telephone Encounter (Signed)
[facility] Pre-operative Risk Assessment     Request for surgical clearance:     Endoscopy Procedure  What type of surgery is being performed?     Colonoscopy  When is this surgery scheduled?     10/19/19  What type of clearance is required ?   Pharmacy  Are there any medications that need to be held prior to surgery and how long? HOLD PLAVIX FIVE DAYS PRIOR  Practice name and name of physician performing surgery?      [facility]/Dr. Lyndel Safe  What is your office phone and fax number?      Phone- [phone]  Fax- [phone] Attn: Peter Congo, RMA  Anesthesia type (None, local, MAC, general) ?       MAC

## 2019-08-18 NOTE — Progress Notes (Signed)
Chief Complaint: FU  Referring Provider:  Jinny Sanders, MD      ASSESSMENT AND PLAN;   #1.  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  Pt presented with colonic obstruction 07/23/2019 on CT, neg colon 07/26/2019 for masses.  It did show colitis with div. Neg Bx.  #2. 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  Treated with doxycycline.  #3. Malnutrition with low albumin 2.0. LFTs are Nl.  #4. Comorbid conditions include CAD s/p DES RCA x 2 on plavix, prediabetes, HTN, HLD, depression  Plan: -Continue Lialda 4.8 g/day x 8 weeks, then reduce to 2.4 g/day x 4-6 months -Repeat colonoscopy in mid Oct 2021 off plavix 5 days with miralax prep to ensure mucosal healing along with routine CRC screening -Continue Imodium as needed -Encouraged p.o.. Small but more frequent meals. -He already has FU with [facility]. -Blood tests including CBC, CMP at FU visit.    HPI:    Roy [name] is a 74 y.o. male  FU from second hospitalization, D/C 08/15/2019.  Doing well Diarrhea resolved, taking imodium prn. No abdominal pain. No nausea or vomiting. Tolerating p.o. well.  Does not have very good appetite.  His stool studies were negative for C. Difficile.  Fecal calprotectin 197 (N < 120). LFTs have normalized.  Albumin 2.0.  Past GI procedures:  Colonoscopy 07/26/2019: - Congested mucosa in the proximal sigmoid colon. No stricture or masses. Bx: neg. - Diverticulosis in the sigmoid colon and in the descending colon.  Abdominal/Pelvic CT with contrast 07/23/2019: 1. 5 cm obstructing enhancing lesion in the upper sigmoid colon near the sigmoid colon descending colon junction region worrisome for colonic neoplasm causing low colonic obstruction. Fairly dilated colon and small bowel above the lesion. Recommend surgical consultation. 2. No findings for locoregional adenopathy or distant metastatic disease. 3. Advanced atherosclerotic calcifications  involving the aorta and iliac arteries. 4. Aortic atherosclerosis.  Abdominal/Pelvic CT with contrast 07/26/2019: Interval improvement in caliber of small bowel and decompression of the colon in keeping with near resolution of previously noted large bowel obstruction, likely related to nasogastric decompression.  Interval development of a infectious or inflammatory colitis involving the entire colon proximal to the point of sigmoid obstruction. The obstructing mass is not well delineated on this Examination.  CT AP 08/10/2019 Persistent wall thickening within the sigmoid with more proximal fluid retention in the colon and distal small bowel dilatation consistent with a partial colonic obstruction. These changes are likely related to diverticulitis as recent colonoscopy shows no definitive mass lesion. No abscess or perforation is noted.   Past Medical History:  Diagnosis Date  . Basal cell carcinoma    Bilateral Legs  . CAD (coronary artery disease)    a.  stent to the RCA in 2001;  b.  Negative Myoview in January 2012;  c.  LHC 4/16:  dLM 20, small D1 50-70, D2 sub-total, oLCx 50-70, RCA stent ok, dPLB 90, EF 35-40% >> DES to D2  . Depression   . History of echocardiogram    a.  Echo 4/16: Mild LVH, EF 53-61%, grade 1 diastolic dysfunction, normal wall motion, MAC  . HLD (hyperlipidemia)   . HTN (hypertension)   . OSA (obstructive sleep apnea)   . Vertigo     Past Surgical History:  Procedure Laterality Date  . BIOPSY  07/26/2019   Procedure: BIOPSY;  Surgeon: Gatha Mayer, MD;  Location: [facility];  Service: Endoscopy;;  . CATARACT EXTRACTION, BILATERAL    .  COLONOSCOPY N/A 07/26/2019   Procedure: COLONOSCOPY;  Surgeon: Gatha Mayer, MD;  Location: [facility] ENDOSCOPY;  Service: Endoscopy;  Laterality: N/A;  . COLONOSCOPY    . CORONARY STENT PLACEMENT  2001    RCA  . LEFT HEART CATHETERIZATION WITH CORONARY ANGIOGRAM N/A 02/13/2012   Procedure: LEFT HEART  CATHETERIZATION WITH CORONARY ANGIOGRAM;  Surgeon: [name], MD;  Location: [facility] CATH LAB;  Service: Cardiovascular;  Laterality: N/A;  . LEFT HEART CATHETERIZATION WITH CORONARY ANGIOGRAM N/A 04/24/2014   Procedure: LEFT HEART CATHETERIZATION WITH CORONARY ANGIOGRAM;  Surgeon: [name], MD;  Location: [facility] CATH LAB;  Service: Cardiovascular;  Laterality: N/A;  . TONSILLECTOMY      Family History  Problem Relation Age of Onset  . Cirrhosis Mother   . Alcohol abuse Mother   . Heart disease Father   . Hypertension Father   . Peripheral vascular disease Father   . Depression Sister   . Heart attack Neg Hx   . Stroke Neg Hx   . Stomach cancer Neg Hx   . Pancreatic cancer Neg Hx   . Colon cancer Neg Hx   . Esophageal cancer Neg Hx     Social History   Tobacco Use  . Smoking status: Former Smoker    Packs/day: 1.00    Years: 25.00    Pack years: 25.00    Types: Cigarettes    Quit date: 01/14/2000    Years since quitting: 19.6  . Smokeless tobacco: Never Used  Vaping Use  . Vaping Use: Never used  Substance Use Topics  . Alcohol use: Yes    Alcohol/week: 3.0 standard drinks    Types: 1 Cans of beer, 2 Shots of liquor per week  . Drug use: No    Current Outpatient Medications  Medication Sig Dispense Refill  . carvedilol (COREG) 12.5 MG tablet Take 6.25 mg by mouth 2 (two) times daily with a meal.     . clopidogrel (PLAVIX) 75 MG tablet TAKE 1 TABLET BY MOUTH EVERY DAY (Patient taking differently: Take 75 mg by mouth daily. ) 90 tablet 3  . dicyclomine (BENTYL) 20 MG tablet Take 1 tablet (20 mg total) by mouth every 6 (six) hours as needed (abdominal pain). 30 tablet 0  . Glucosamine HCl (GLUCOSAMINE PO) Take 2 tablets by mouth daily.    Marland Kitchen loperamide (IMODIUM) 2 MG capsule Take 1 capsule (2 mg total) by mouth every 6 (six) hours as needed for diarrhea or loose stools. 30 capsule 0  . Magnesium Oxide 400 MG CAPS Take 1 capsule (400 mg total) by mouth daily. 30 capsule 0   . mesalamine (LIALDA) 1.2 g EC tablet Take 4 tablets (4.8 g total) by mouth daily with breakfast. 120 tablet 1  . nitroGLYCERIN (NITROSTAT) 0.4 MG SL tablet Place 1 tablet (0.4 mg total) under the tongue every 5 (five) minutes x 3 doses as needed for chest pain. 30 tablet 0  . omeprazole (PRILOSEC) 20 MG capsule Take 1 capsule (20 mg total) by mouth daily before breakfast.    . ondansetron (ZOFRAN) 4 MG tablet Take 1 tablet (4 mg total) by mouth every 8 (eight) hours as needed for nausea or vomiting. 20 tablet 0  . potassium chloride 20 MEQ TBCR Take 20 mEq by mouth daily. Take 2 pills (40 mEq) for a week then continue taking 20 mEq daily.Check potassium  level in a week 60 tablet 0  . tamsulosin (FLOMAX) 0.4 MG CAPS capsule Take 1 capsule (  0.4 mg total) by mouth daily. 30 capsule 1  . venlafaxine XR (EFFEXOR-XR) 75 MG 24 hr capsule TAKE 1 CAPSULE (75 MG TOTAL) BY MOUTH DAILY WITH BREAKFAST. 90 capsule 1   No current facility-administered medications for this visit.    No Known Allergies  Review of Systems:  neg     Physical Exam:    BP 98/64   Pulse 89   Ht _0  (1.676 m)   Wt 164 lb 8 oz (74.6 kg)   BMI 26.55 kg/m  Wt Readings from Last 3 Encounters:  08/18/19 164 lb 8 oz (74.6 kg)  08/10/19 163 lb 12.8 oz (74.3 kg)  08/02/19 162 lb 8 oz (73.7 kg)   Constitutional:  Well-developed, in no acute distress. Psychiatric: Normal mood and affect. Behavior is normal. HEENT: Pupils normal.  Conjunctivae are normal. No scleral icterus. Neck supple.  Cardiovascular: Normal rate, regular rhythm. No edema Pulmonary/chest: Effort normal and breath sounds normal. No wheezing, rales or rhonchi. Abdominal: Soft, nondistended. Nontender. Bowel sounds active throughout. There are no masses palpable. No hepatomegaly. Rectal:  defered Neurological: Alert and oriented to person place and time. Skin: Skin is warm and dry. No rashes noted.  Data Reviewed: I have personally reviewed following  labs and imaging studies  CBC: CBC Latest Ref Rng & Units 08/11/2019 08/10/2019 08/01/2019  WBC 4.0 - 10.5 K/uL 6.9 10.7(H) 12.4(H)  Hemoglobin 13.0 - 17.0 g/dL 11.2(L) 14.7 14.1  Hematocrit 39 - 52 % 32.4(L) 43.9 40.6  Platelets 150 - 400 K/uL 245 462(H) 409.0(H)    CMP: CMP Latest Ref Rng & Units 08/15/2019 08/14/2019 08/13/2019  Glucose 70 - 99 mg/dL 115(H) 110(H) 101(H)  BUN 8 - 23 mg/dL 6(L) 9 9  Creatinine 0.61 - 1.24 mg/dL 0.74 0.90 0.92  Sodium 135 - 145 mmol/L 139 141 140  Potassium 3.5 - 5.1 mmol/L 3.0(L) 3.9 3.0(L)  Chloride 98 - 111 mmol/L 111 116(H) 113(H)  CO2 22 - 32 mmol/L 21(L) 19(L) 20(L)  Calcium 8.9 - 10.3 mg/dL 7.5(L) 7.6(L) 7.6(L)  Total Protein 6.5 - 8.1 g/dL - - -  Total Bilirubin 0.3 - 1.2 mg/dL - - -  Alkaline Phos 38 - 126 U/L - - -  AST 15 - 41 U/L - - -  ALT 0 - 44 U/L - - -   Hepatic Function Latest Ref Rng & Units 08/11/2019 08/10/2019 08/01/2019  Total Protein 6.5 - 8.1 g/dL 4.6(L) 6.1(L) 6.8  Albumin 3.5 - 5.0 g/dL 2.0(L) 2.7(L) 3.5  AST 15 - 41 U/L 13(L) 21 28  ALT 0 - 44 U/L 23 30 108(H)  Alk Phosphatase 38 - 126 U/L 58 86 109  Total Bilirubin 0.3 - 1.2 mg/dL 0.6 0.9 0.6  Bilirubin, Direct 0.0 - 0.3 mg/dL - - 0.1    Radiology Studies: CT ABDOMEN PELVIS WO CONTRAST  Result Date: 08/10/2019 CLINICAL DATA:  Abdominal pain and diarrhea for 1 week following colonoscopy EXAM: CT ABDOMEN AND PELVIS WITHOUT CONTRAST TECHNIQUE: Multidetector CT imaging of the abdomen and pelvis was performed following the standard protocol without IV contrast. COMPARISON:  08/03/2019 FINDINGS: Lower chest: Calcified granuloma is noted in the left laterally. Mild scarring is noted in the right lung base stable from the prior exam. Focal herniation of fat is noted to the posteromedial left hemidiaphragm. Hepatobiliary: Gallbladder is well distended. The liver is within normal limits. Pancreas: Unremarkable. No pancreatic ductal dilatation or surrounding inflammatory changes.  Spleen: Multiple calcified granulomas are noted. No other focal abnormality  is seen. Adrenals/Urinary Tract: Adrenal glands are within normal limits. Kidneys are well visualize without renal calculi or obstructive change. The bladder is within normal limits. Stomach/Bowel: Colon demonstrates scattered diverticular change as well as wall thickening in the sigmoid. Proximal to this there is significant dilatation of the colon with fluid as well as some mild dilatation of distal small bowel. These changes are consistent with a partial colonic obstruction related to the wall thickening seen in the sigmoid. This is again somewhat suspicious for underlying mass although recent colonoscopy shows no definitive mass. Stomach is within normal limits. Vascular/Lymphatic: Aortic atherosclerosis. No enlarged abdominal or pelvic lymph nodes. Reproductive: Prostate is unremarkable. Other: No abdominal wall hernia or abnormality. No abdominopelvic ascites. Musculoskeletal: No acute or significant osseous findings. IMPRESSION: Persistent wall thickening within the sigmoid with more proximal fluid retention in the colon and distal small bowel dilatation consistent with a partial colonic obstruction. These changes are likely related to diverticulitis as recent colonoscopy shows no definitive mass lesion. No abscess or perforation is noted. Prior granulomatous disease. No other focal abnormality is noted. Electronically Signed   By: Inez Catalina M.D.   On: 08/10/2019 22:34   DG Chest 2 View  Result Date: 08/10/2019 CLINICAL DATA:  74 year old male with sepsis. EXAM: CHEST - 2 VIEW COMPARISON:  Chest radiograph dated 07/23/2019. FINDINGS: Patchy area of density in the lingula may represent atelectasis. Infiltrate is not excluded clinical correlation is recommended. There is no pleural effusion or pneumothorax. Left lung base calcified granuloma. The cardiac silhouette is within limits. No acute osseous pathology. IMPRESSION:  Lingular atelectasis versus infiltrate. Electronically Signed   By: Anner Crete M.D.   On: 08/10/2019 21:53   DG Abd 1 View  Result Date: 07/25/2019 CLINICAL DATA:  Bowel obstruction. EXAM: ABDOMEN - 1 VIEW COMPARISON:  The CT abdomen pelvis-07/23/2019 FINDINGS: There is moderate to marked gaseous distention of multiple loops of predominantly small bowel with index loop of small bowel in the left mid hemiabdomen measuring 3.5 cm in diameter. A small amount of air is seen within the ascending and transverse colon however otherwise series a conspicuous paucity of distal colonic gas. No supine evidence of pneumoperitoneum. No pneumatosis or portal venous gas Enteric tip and side port projected the expected location of in by the stomach Limited visualization of the lower thorax demonstrates mild elevation of the right hemidiaphragm. A punctate granuloma overlies the left lower lobe with associated partially calcified left hilar lymph nodes, the sequela of previous granulomatous infection. Excreted contrast is seen within the urinary bladder. Mild to moderate multilevel lumbar spine DDD is suspected though incompletely evaluated. IMPRESSION: 1. Findings most suggestive of small-bowel obstruction though note, a distal colonic lesion was identified on abdominal CT performed 07/23/2019. 2. Enteric tube tip and side port project over the expected location of the mid body of the stomach. Electronically Signed   By: Sandi Mariscal M.D.   On: 07/25/2019 07:08   CT CHEST W CONTRAST  Result Date: 07/24/2019 CLINICAL DATA:  Obstructing sigmoid colon mass. Evaluate for metastatic disease. EXAM: CT CHEST WITH CONTRAST TECHNIQUE: Multidetector CT imaging of the chest was performed during intravenous contrast administration. CONTRAST:  66m OMNIPAQUE IOHEXOL 300 MG/ML  SOLN COMPARISON:  CT abdomen/pelvis 07/23/2019 FINDINGS: Cardiovascular: The heart is normal in size. No pericardial effusion. The aorta is normal in caliber.  No dissection. Minimal atherosclerotic calcification at the aortic arch. Branch vessels are patent. Advanced three-vessel coronary artery calcifications are noted. Mediastinum/Nodes: No mediastinal or hilar  mass or lymphadenopathy. The esophagus is grossly normal. There is an NG tube coursing down the esophagus and into the stomach. Lungs/Pleura: No worrisome pulmonary lesions to suggest metastatic disease no acute pulmonary findings. No pleural effusions. Streaky right basilar atelectasis is noted. Upper Abdomen: Persistent dilated small bowel and colon. No free air. The gallbladder is mildly distended. No hepatic lesions are identified. Musculoskeletal: No significant bony findings. IMPRESSION: 1. No CT findings for pulmonary metastatic disease. 2. No mediastinal or hilar mass or adenopathy. 3. Advanced three-vessel coronary artery calcifications. 4. Persistent dilated small bowel and colon. 5. Aortic atherosclerosis. Aortic Atherosclerosis (ICD10-I70.0). Electronically Signed   By: Marijo Sanes M.D.   On: 07/24/2019 13:06   CT ABDOMEN PELVIS W CONTRAST  Result Date: 07/26/2019 CLINICAL DATA:  Sigmoid mass, bowel obstruction EXAM: CT ABDOMEN AND PELVIS WITH CONTRAST TECHNIQUE: Multidetector CT imaging of the abdomen and pelvis was performed using the standard protocol following bolus administration of intravenous contrast. CONTRAST:  132m OMNIPAQUE IOHEXOL 300 MG/ML  SOLN COMPARISON:  07/23/2019 FINDINGS: Lower chest: Right basilar atelectasis. 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 Nasogastric tube is seen extending into the mid to distal body of the stomach. Right coronary artery calcification and probable coronary artery stenting has been performed. Global cardiac size within normal limits. Hepatobiliary: Tiny hypodensity within the right hepatic dome, axial image # 8/3, is again seen and likely represents a tiny a Paddock cyst, though is too  small to definitively characterize. The liver is otherwise unremarkable. The gallbladder is unremarkable. Pancreas: Unremarkable Spleen: Unremarkable Adrenals/Urinary Tract: The adrenal glands are unremarkable. The kidneys are unremarkable. The bladder is largely decompressed and is unremarkable. Stomach/Bowel: The previously noted sigmoid mass is not well appreciated on this examination. There is, however, interval development of extensive circumferential bowel wall thickening and mucosal fold thickening involving the entire colon proximal to the point of obstruction within the sigmoid colon in keeping with an infectious or inflammatory colitis. There is normal enhancement of the colon. No pneumatosis or free intraperitoneal gas. The colon is largely decompressed. Similarly, there has been interval improvement in the caliber of the small bowel when compared to prior examination in keeping with improvement in large bowel obstruction, likely related to nasogastric decompression. No free intraperitoneal fluid. Appendix normal. Vascular/Lymphatic: There is no pathologic adenopathy within the abdomen and pelvis. The abdominal vasculature is age-appropriate with moderate aortoiliac atherosclerotic calcification. No aneurysm. Reproductive: Unremarkable Other: The rectum is unremarkable. Musculoskeletal: No lytic or blastic bone lesions are seen. Osseous structures are age-appropriate. IMPRESSION: Interval improvement in caliber of small bowel and decompression of the colon in keeping with near resolution of previously noted large bowel obstruction, likely related to nasogastric decompression. Interval development of a infectious or inflammatory colitis involving the entire colon proximal to the point of sigmoid obstruction. The obstructing mass is not well delineated on this examination. Tiny indeterminate lesion within the right hepatic dome likely represents a tiny cyst, the can be reassessed on subsequent examination.  Aortic Atherosclerosis (ICD10-I70.0). Electronically Signed   By: AFidela SalisburyMD   On: 07/26/2019 21:35   CT ABDOMEN PELVIS W CONTRAST  Result Date: 07/23/2019 CLINICAL DATA:  Abdominal distension and back pain.  Bloody stools. EXAM: CT ABDOMEN AND PELVIS WITH CONTRAST TECHNIQUE: Multidetector CT imaging of the abdomen and pelvis was performed using the standard protocol following bolus administration of intravenous contrast. CONTRAST:  796mOMNIPAQUE IOHEXOL 300 MG/ML  SOLN COMPARISON:  None. FINDINGS: Lower  chest: Streaky right basilar scarring changes are noted. There is a calcified granuloma noted at the left lung base. No worrisome pulmonary lesions or pleural effusion. The heart is normal in size. No pericardial effusion. Coronary artery calcifications are noted. The distal esophagus is grossly normal. Hepatobiliary: Very small low-attenuation lesion at the hepatic dome is likely a benign cyst. No worrisome hepatic lesions are identified. The gallbladder is unremarkable. No common bile duct dilatation. Pancreas: No mass, inflammation or ductal dilatation. Spleen: Normal size. A few small scattered calcified granulomas are noted. Adrenals/Urinary Tract: The adrenal glands are normal. No renal lesions or hydronephrosis.  Bladder is unremarkable. Stomach/Bowel: The stomach is mildly distended with fluid and air. The small bowel is dilated and demonstrates scattered air-fluid levels. The colon is also dilated with fluid and air down to an obstructing enhancing lesion in the upper sigmoid colon near the sigmoid colon descending colon junction region. This measures approximately 5 cm and is highly suspicious for colonic neoplasm causing low colonic obstruction. Recommend surgical consultation. Vascular/Lymphatic: Advanced atherosclerotic calcifications involving the aorta and iliac arteries. No aneurysm. The branch vessels are patent. The major venous structures are patent. There are small scattered  subcentimeter retroperitoneal lymph nodes. No mass or overt adenopathy. No pelvic adenopathy. I do not see any enlarged lymph nodes in the sigmoid mesocolon or in the pelvis to suggest locoregional adenopathy. Reproductive: The prostate gland and seminal vesicles are unremarkable. Other: No free pelvic fluid collections. No inguinal mass or adenopathy. Musculoskeletal: No significant bony findings. IMPRESSION: 1. 5 cm obstructing enhancing lesion in the upper sigmoid colon near the sigmoid colon descending colon junction region worrisome for colonic neoplasm causing low colonic obstruction. Fairly dilated colon and small bowel above the lesion. Recommend surgical consultation. 2. No findings for locoregional adenopathy or distant metastatic disease. 3. Advanced atherosclerotic calcifications involving the aorta and iliac arteries. 4. Aortic atherosclerosis. Aortic Atherosclerosis (ICD10-I70.0). Electronically Signed   By: Marijo Sanes M.D.   On: 07/23/2019 13:47   DG Chest Port 1 View  Result Date: 07/23/2019 CLINICAL DATA:  Patient arrived by [facility] complaining of lower back pain radiating through to abdomen x 1 month. States that he has had increased gas and dark stools for several weeks. Has just developed nausea and vomiting x 1 day. Alert EXAM: PORTABLE CHEST - 1 VIEW COMPARISON:  02/10/2012 FINDINGS: Linear scarring or subsegmental atelectasis in the right mid lung. Stable calcified granuloma, left lower lung. No evidence of edema. Heart size and mediastinal contours are within normal limits. No effusion.  No pneumothorax. Visualized bones unremarkable. IMPRESSION: New linear scarring or atelectasis, right mid lung. Otherwise negative. Electronically Signed   By: Lucrezia Europe M.D.   On: 07/23/2019 12:21   DG Abd Portable 1V  Result Date: 07/26/2019 CLINICAL DATA:  Bowel obstruction EXAM: PORTABLE ABDOMEN - 1 VIEW COMPARISON:  July 25, 2019 abdominal radiograph and CT abdomen and pelvis July 23, 2019  FINDINGS: Nasogastric tube tip and side port in stomach. Multiple loops of dilated bowel remain with bowel dilatation of up to 4.1 cm. No significant colonic dilatation seen currently. No appreciable free air. No abnormal calcifications. IMPRESSION: Nasogastric tube tip and side port in stomach. There is persistent predominantly small bowel dilatation, felt to represent a degree of bowel obstruction. No free air appreciable on supine examination. Electronically Signed   By: Lowella Grip III M.D.   On: 07/26/2019 08:07   ECHOCARDIOGRAM COMPLETE  Result Date: 07/25/2019    ECHOCARDIOGRAM REPORT  Patient Name:   JAXTON CASALE Date of Exam: 07/25/2019 Medical Rec #:  [id number]          Height:       66.0 in Accession #:    [id number]         Weight:       167.8 lb Date of Birth:  01-11-1946          BSA:          1.856 m Patient Age:    55 years           BP:           122/81 mmHg Patient Gender: M                  HR:           78 bpm. Exam Location:  Inpatient Procedure: 2D Echo, Color Doppler and Cardiac Doppler Indications:    Pre-Op Evaluation Z01.810  History:        Patient has prior history of Echocardiogram examinations, most                 recent 04/19/2015. Risk Factors:Hypertension, Diabetes,                 Dyslipidemia and Sleep Apnea.  Sonographer:    Raquel Sarna Senior RDCS Referring Phys: [id number] [place]  1. Left ventricular ejection fraction, by estimation, is 70 to 75%. The left ventricle has hyperdynamic function. The left ventricle has no regional wall motion abnormalities. Left ventricular diastolic parameters are consistent with Grade I diastolic dysfunction (impaired relaxation).  2. Right ventricular systolic function is normal. The right ventricular size is normal. There is normal pulmonary artery systolic pressure.  3. The mitral valve is normal in structure. Trivial mitral valve regurgitation. No evidence of mitral stenosis.  4. The aortic valve is tricuspid.  Aortic valve regurgitation is not visualized. No aortic stenosis is present.  5. The inferior vena cava is normal in size with greater than 50% respiratory variability, suggesting right atrial pressure of 3 mmHg. FINDINGS  Left Ventricle: Left ventricular ejection fraction, by estimation, is 70 to 75%. The left ventricle has hyperdynamic function. The left ventricle has no regional wall motion abnormalities. The left ventricular internal cavity size was normal in size. There is no left ventricular hypertrophy. Left ventricular diastolic parameters are consistent with Grade I diastolic dysfunction (impaired relaxation). Right Ventricle: The right ventricular size is normal.Right ventricular systolic function is normal. There is normal pulmonary artery systolic pressure. The tricuspid regurgitant velocity is 2.35 m/s, and with an assumed right atrial pressure of 3 mmHg, the estimated right ventricular systolic pressure is 24.2 mmHg. Left Atrium: Left atrial size was normal in size. Right Atrium: Right atrial size was normal in size. Pericardium: There is no evidence of pericardial effusion. Mitral Valve: The mitral valve is normal in structure. Normal mobility of the mitral valve leaflets. Trivial mitral valve regurgitation. No evidence of mitral valve stenosis. Tricuspid Valve: The tricuspid valve is normal in structure. Tricuspid valve regurgitation is trivial. No evidence of tricuspid stenosis. Aortic Valve: The aortic valve is tricuspid. Aortic valve regurgitation is not visualized. No aortic stenosis is present. Pulmonic Valve: The pulmonic valve was not well visualized. Pulmonic valve regurgitation is not visualized. No evidence of pulmonic stenosis. Aorta: The aortic root is normal in size and structure. Venous: The inferior vena cava is normal in size with greater than 50% respiratory variability, suggesting right atrial  pressure of 3 mmHg. IAS/Shunts: No atrial level shunt detected by color flow Doppler.   LEFT VENTRICLE PLAX 2D LVIDd:         3.14 cm  Diastology LVIDs:         1.99 cm  LV e' lateral:   8.81 cm/s LV PW:         1.10 cm  LV E/e' lateral: 6.6 LV IVS:        1.15 cm  LV e' medial:    6.64 cm/s LVOT diam:     2.10 cm  LV E/e' medial:  8.8 LV SV:         60 LV SV Index:   32 LVOT Area:     3.46 cm  RIGHT VENTRICLE RV S prime:     14.90 cm/s TAPSE (M-mode): 2.2 cm LEFT ATRIUM             Index       RIGHT ATRIUM           Index LA diam:        3.70 cm 1.99 cm/m  RA Area:     16.10 cm LA Vol (A2C):   43.8 ml 23.60 ml/m RA Volume:   36.50 ml  19.67 ml/m LA Vol (A4C):   50.2 ml 27.05 ml/m LA Biplane Vol: 48.3 ml 26.02 ml/m  AORTIC VALVE LVOT Vmax:   86.50 cm/s LVOT Vmean:  56.500 cm/s LVOT VTI:    0.174 m  AORTA Ao Root diam: 3.50 cm MITRAL VALVE               TRICUSPID VALVE MV Area (PHT): 3.27 cm    TR Peak grad:   22.1 mmHg MV Decel Time: 232 msec    TR Vmax:        235.00 cm/s MV E velocity: 58.20 cm/s MV A velocity: 85.40 cm/s  SHUNTS MV E/A ratio:  0.68        Systemic VTI:  0.17 m                            Systemic Diam: 2.10 cm Kirk Ruths MD Electronically signed by Kirk Ruths MD Signature Date/Time: 07/25/2019/3:40:46 PM    Final    [place] EKG SITE RITE  Result Date: 07/25/2019 If Site Rite image not attached, placement could not be confirmed due to current cardiac rhythm.  [place] EKG SITE RITE  Result Date: 07/24/2019 If Site Rite image not attached, placement could not be confirmed due to current cardiac rhythm.  CT Angio Abd/Pel w/ and/or w/o  Result Date: 08/03/2019 CLINICAL DATA:  74 year old male with generalized abdominal pain x3 weeks. EXAM: CTA ABDOMEN AND PELVIS WITHOUT AND WITH CONTRAST TECHNIQUE: Multidetector CT imaging of the abdomen and pelvis was performed using the standard protocol during bolus administration of intravenous contrast. Multiplanar reconstructed images and MIPs were obtained and reviewed to evaluate the vascular anatomy. CONTRAST:  114m OMNIPAQUE  IOHEXOL 350 MG/ML SOLN COMPARISON:  CT abdomen pelvis dated 07/26/2019. CT of the abdomen pelvis dated 07/23/2019. FINDINGS: VASCULAR Aorta: Advanced atherosclerotic calcification of the abdominal aorta. No aneurysmal dilatation or dissection. No periaortic fluid or inflammation. Celiac: There is atherosclerotic calcification of the origin the celiac artery. 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 The major branches of the celiac artery are patent. SMA: Patent without evidence of aneurysm, dissection, vasculitis or significant stenosis. Renals: The renal  arteries are patent. 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 IMA: Patent without evidence of aneurysm, dissection, vasculitis or significant stenosis. Inflow: Moderate atherosclerotic calcification of the iliac arteries. The iliac arteries are patent. No aneurysm or dissection. Proximal Outflow: Bilateral common femoral and visualized portions of the superficial and profunda femoral arteries are patent without evidence of aneurysm, dissection, vasculitis or significant stenosis. Veins: The IVC is unremarkable. The SMV, splenic vein, and main portal vein are patent. No portal venous gas. Review of the MIP images confirms the above findings. NON-VASCULAR Lower chest: Subcentimeter left lung base calcified granuloma. Minimal right lung base atelectasis/scarring. Advanced coronary vascular calcification of the LAD and RCA. Left hilar calcified granuloma. No intra-abdominal free air or free fluid. Hepatobiliary: The liver is unremarkable. Subcentimeter right hepatic hypodense focus is too small to characterize. No intrahepatic biliary ductal dilatation. The gallbladder is unremarkable. Pancreas: Unremarkable. No pancreatic ductal dilatation or surrounding inflammatory changes. Spleen: Normal in size without focal abnormality. Adrenals/Urinary Tract: The adrenal glands unremarkable. There is no hydronephrosis on either side.  There is symmetric enhancement the kidneys. The visualized ureters appear unremarkable. The urinary bladder is collapsed. Diffuse thickened appearance of the bladder wall may be related to underdistention. Cystitis is not excluded. Clinical correlation is recommended. Stomach/Bowel: Sigmoid mass involving approximately 6 cm length of the sigmoid colon with associated luminal narrowing as described on the prior CT. There is diffuse inflammatory changes and thickening of the colon most consistent with colitis. There has been interval improvement and resolution of previously seen bowel dilatation. There are scattered colonic diverticula without active inflammatory changes. No small bowel obstruction. The appendix is normal. Lymphatic: No adenopathy. Reproductive: The prostate and seminal vesicles are grossly unremarkable. Other: None Musculoskeletal: No acute or significant osseous findings. IMPRESSION: 1. Colitis. Correlation with clinical exam and stool cultures recommended. 2. Sigmoid mass with associated luminal narrowing as described on the prior CT. There has been interval improvement and resolution of previously seen bowel dilatation. No bowel obstruction. Normal appendix. 3. No aortic aneurysm or dissection. 4. 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 5. Aortic Atherosclerosis (ICD10-I70.0). Electronically Signed   By: Anner Crete M.D.   On: 08/03/2019 18:13      Carmell Austria, MD 08/18/2019, 8:49 AM  Cc: Jinny Sanders, MD

## 2019-08-18 NOTE — Telephone Encounter (Signed)
Roy [name]. [employer] 74 year old male would like to have colonoscopy.  His last seen while admitted for cardiology consult 07/28/2019.  He denied chest pain, palpitations and dyspnea at that time.  May his Plavix be held prior to the procedure?  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  Please direct response to CV DIV pool.  Thank you for your help.  Jossie Ng. Aniza Shor NP-C    08/18/2019, 9:28 AM [place] [address] Office [phone] Fax [phone]

## 2019-08-18 NOTE — Patient Instructions (Addendum)
If you are age 73 or older, your body mass index should be between 23-30. Your Body mass index is 26.55 kg/m. If this is out of the aforementioned range listed, please consider follow up with your Primary Care Provider.  If you are age 55 or younger, your body mass index should be between 19-25. Your Body mass index is 26.55 kg/m. If this is out of the aformentioned range listed, please consider follow up with your Primary Care Provider.   You have been scheduled for a colonoscopy. Please follow written instructions given to you at your visit today.  Please pick up your prep supplies at the pharmacy within the next 1-3 days. If you use inhalers (even only as needed), please bring them with you on the day of your procedure.  You will be contacted by our office prior to your procedure for directions on holding your Plavix.  If you do not hear from our office 1 week prior to your scheduled procedure, please call [phone] to discuss.   Thank you,  Dr. Jackquline Denmark

## 2019-08-21 ENCOUNTER — Other Ambulatory Visit: Payer: Self-pay | Admitting: Cardiovascular Disease

## 2019-08-22 NOTE — Telephone Encounter (Signed)
° °  Primary Cardiologist: Jenkins Rouge, MD  Chart reviewed as part of pre-operative protocol coverage. Given past medical history and time since last visit, based on ACC/AHA guidelines, Roy [name] would be at acceptable risk for the planned procedure without further cardiovascular testing.   His Plavix may be held for 5 days prior to his procedure.  Please resume as soon as hemostasis is achieved.  I will route this recommendation to the requesting party via Epic fax function and remove from pre-op pool.  Please call with questions.  Jossie Ng. Glenda Kunst NP-C    08/22/2019, 10:39 AM [place] [address] Office [phone] Fax [phone]

## 2019-08-22 NOTE — Telephone Encounter (Signed)
Ok to hold plavix for colonoscopy 

## 2019-08-23 ENCOUNTER — Other Ambulatory Visit: Payer: Self-pay

## 2019-08-23 ENCOUNTER — Ambulatory Visit: Payer: Medicare HMO | Admitting: Family Medicine

## 2019-08-23 ENCOUNTER — Encounter: Payer: Self-pay | Admitting: Family Medicine

## 2019-08-23 ENCOUNTER — Telehealth: Payer: Self-pay | Admitting: Family Medicine

## 2019-08-23 ENCOUNTER — Telehealth: Payer: Self-pay

## 2019-08-23 ENCOUNTER — Ambulatory Visit (INDEPENDENT_AMBULATORY_CARE_PROVIDER_SITE_OTHER): Payer: Medicare HMO | Admitting: Family Medicine

## 2019-08-23 VITALS — BP 80/60 | HR 87 | Temp 97.1°F | Ht 66.0 in | Wt 158.5 lb

## 2019-08-23 DIAGNOSIS — E43 Unspecified severe protein-calorie malnutrition: Secondary | ICD-10-CM

## 2019-08-23 DIAGNOSIS — E861 Hypovolemia: Secondary | ICD-10-CM | POA: Diagnosis not present

## 2019-08-23 DIAGNOSIS — E876 Hypokalemia: Secondary | ICD-10-CM

## 2019-08-23 DIAGNOSIS — R0789 Other chest pain: Secondary | ICD-10-CM

## 2019-08-23 DIAGNOSIS — K529 Noninfective gastroenteritis and colitis, unspecified: Secondary | ICD-10-CM | POA: Diagnosis not present

## 2019-08-23 DIAGNOSIS — A046 Enteritis due to Yersinia enterocolitica: Secondary | ICD-10-CM | POA: Insufficient documentation

## 2019-08-23 DIAGNOSIS — E1165 Type 2 diabetes mellitus with hyperglycemia: Secondary | ICD-10-CM

## 2019-08-23 DIAGNOSIS — I9589 Other hypotension: Secondary | ICD-10-CM | POA: Diagnosis not present

## 2019-08-23 DIAGNOSIS — N179 Acute kidney failure, unspecified: Secondary | ICD-10-CM | POA: Diagnosis not present

## 2019-08-23 DIAGNOSIS — F331 Major depressive disorder, recurrent, moderate: Secondary | ICD-10-CM

## 2019-08-23 DIAGNOSIS — R69 Illness, unspecified: Secondary | ICD-10-CM | POA: Diagnosis not present

## 2019-08-23 MED ORDER — VENLAFAXINE HCL ER 37.5 MG PO CP24: ORAL_CAPSULE | ORAL | 5 refills | Status: DC

## 2019-08-23 NOTE — Telephone Encounter (Signed)
Left message for patient to call back  

## 2019-08-23 NOTE — Progress Notes (Signed)
Chief Complaint  Patient presents with  . Hospitalization Follow-up    Colitis/Acute renal failure  . Asthma    History of Present Illness: HPI    74 year old male presents for follow up on recurrent hospitalizations for acute colitis and  Acute renal failure.  Reviewed notes from hospital and GI office visits in detail.  Last seen for  Inflammatory colitis suspected segmental colitis by GI on 08/18/2019  Continued on Lialda.. plan 4-6 month total course.  Plan: Repeat colonoscopy in mid Oct 2021 off plavix 5 days with miralax prep to ensure mucosal healing along with routine CRC screening S/P treatment with doxycycline for yersinia. HAS lab re-eval and follow up with GI colleen NP on 08/29/2019.  Malnutrition  Potassium and Mg low at discharge, ARF resolved at discharge.  On magnesium and potassium. Wt Readings from Last 3 Encounters:  08/23/19 158 lb 8 oz (71.9 kg)  08/18/19 164 lb 8 oz (74.6 kg)  08/10/19 163 lb 12.8 oz (74.3 kg)   He reports he is still having some pain, less diarrhea. Still limited po intake.  Emesis continued until but none in last 24 hours. He feels like he may be starting to go through the same cycle again  He has been trying to drink fluids.. Gatorade  He has no energy.  Today, new issue.. he has started feeling pressure in chest... lasted few minutes.. noted after exertion today. Did not need to take nitroglycerin.  Mild SOB.  He is feeling somewhat light headed.  BPs again low.. taking BP Readings from Last 3 Encounters:  08/23/19 (!) 80/60  08/18/19 98/64  08/15/19 (!) 147/94   CAD s/p DES RCA x 2 on plavix  DM:  Well controlled on no med. Lab Results  Component Value Date   HGBA1C 6.2 (H) 07/23/2019   He denies need for adjustment in antidepressant...he is just frustrated with health issues.  Sleeping 6 hours a night.  A note was left by ex wife.. scanned in computer.. question of patient self sabatoging himself.. lying in bed in  soiled diaper, not drinking  Eating.  Did not fill meds until several days after discharge.. ? Is taking lialda.    This visit occurred during the SARS-CoV-2 public health emergency.  Safety protocols were in place, including screening questions prior to the visit, additional usage of staff PPE, and extensive cleaning of exam room while observing appropriate contact time as indicated for disinfecting solutions.   COVID 19 screen:  No recent travel or known exposure to COVID19 The patient denies respiratory symptoms of COVID 19 at this time. The importance of social distancing was discussed today.     Review of Systems  Constitutional: Negative for chills and fever.  HENT: Negative for congestion and ear pain.   Eyes: Negative for pain and redness.  Respiratory: Negative for cough and shortness of breath.   Cardiovascular: Negative for chest pain, palpitations and leg swelling.  Gastrointestinal: Negative for abdominal pain, blood in stool, constipation, diarrhea, nausea and vomiting.  Genitourinary: Negative for dysuria.  Musculoskeletal: Negative for falls and myalgias.  Skin: Negative for rash.  Neurological: Negative for dizziness.  Psychiatric/Behavioral: Negative for depression. The patient is not nervous/anxious.       Past Medical History:  Diagnosis Date  . Basal cell carcinoma    Bilateral Legs  . CAD (coronary artery disease)    a.  stent to the RCA in 2001;  b.  Negative Myoview in January 2012;  c.  LHC 4/16:  dLM 20, small D1 50-70, D2 sub-total, oLCx 50-70, RCA stent ok, dPLB 90, EF 35-40% >> DES to D2  . Depression   . History of echocardiogram    a.  Echo 4/16: Mild LVH, EF 78-46%, grade 1 diastolic dysfunction, normal wall motion, MAC  . HLD (hyperlipidemia)   . HTN (hypertension)   . OSA (obstructive sleep apnea)   . Vertigo     reports that he quit smoking about 19 years ago. His smoking use included cigarettes. He has a 25.00 pack-year smoking history. He  has never used smokeless tobacco. He reports current alcohol use of about 3.0 standard drinks of alcohol per week. He reports that he does not use drugs.   Current Outpatient Medications:  .  carvedilol (COREG) 12.5 MG tablet, TAKE 1 TABLET BY MOUTH 2 TIMES DAILY., Disp: 60 tablet, Rfl: 0 .  clopidogrel (PLAVIX) 75 MG tablet, TAKE 1 TABLET BY MOUTH EVERY DAY, Disp: 90 tablet, Rfl: 3 .  dicyclomine (BENTYL) 20 MG tablet, Take 1 tablet (20 mg total) by mouth every 6 (six) hours as needed (abdominal pain)., Disp: 30 tablet, Rfl: 0 .  Glucosamine HCl (GLUCOSAMINE PO), Take 2 tablets by mouth daily., Disp: , Rfl:  .  loperamide (IMODIUM) 2 MG capsule, Take 1 capsule (2 mg total) by mouth every 6 (six) hours as needed for diarrhea or loose stools., Disp: 30 capsule, Rfl: 0 .  Magnesium Oxide 400 MG CAPS, Take 1 capsule (400 mg total) by mouth daily., Disp: 30 capsule, Rfl: 0 .  mesalamine (LIALDA) 1.2 g EC tablet, Take 4 tablets (4.8 g total) by mouth daily with breakfast., Disp: 120 tablet, Rfl: 1 .  nitroGLYCERIN (NITROSTAT) 0.4 MG SL tablet, Place 1 tablet (0.4 mg total) under the tongue every 5 (five) minutes x 3 doses as needed for chest pain., Disp: 30 tablet, Rfl: 0 .  omeprazole (PRILOSEC) 20 MG capsule, TAKE 1 CAPSULE BY MOUTH EVERY DAY, Disp: 30 capsule, Rfl: 0 .  ondansetron (ZOFRAN) 4 MG tablet, Take 1 tablet (4 mg total) by mouth every 8 (eight) hours as needed for nausea or vomiting., Disp: 20 tablet, Rfl: 0 .  potassium chloride 20 MEQ TBCR, Take 20 mEq by mouth daily. Take 2 pills (40 mEq) for a week then continue taking 20 mEq daily.Check potassium  level in a week, Disp: 60 tablet, Rfl: 0 .  tamsulosin (FLOMAX) 0.4 MG CAPS capsule, Take 1 capsule (0.4 mg total) by mouth daily., Disp: 30 capsule, Rfl: 1 .  venlafaxine XR (EFFEXOR-XR) 75 MG 24 hr capsule, TAKE 1 CAPSULE (75 MG TOTAL) BY MOUTH DAILY WITH BREAKFAST., Disp: 90 capsule, Rfl: 1   Observations/Objective: Blood pressure (!)  80/60, pulse 87, temperature (!) 97.1 F (36.2 C), temperature source Temporal, height _0  (1.676 m), weight 158 lb 8 oz (71.9 kg), SpO2 97 %.  Physical Exam Constitutional:      Appearance: He is well-developed.  HENT:     Head: Normocephalic.     Right Ear: Hearing normal.     Left Ear: Hearing normal.     Nose: Nose normal.  Neck:     Thyroid: No thyroid mass or thyromegaly.     Vascular: No carotid bruit.     Trachea: Trachea normal.  Cardiovascular:     Rate and Rhythm: Normal rate and regular rhythm.     Pulses: Normal pulses.     Heart sounds: Heart sounds not distant. No murmur heard.  No  friction rub. No gallop.      Comments: No peripheral edema Pulmonary:     Effort: Pulmonary effort is normal. No respiratory distress.     Breath sounds: Normal breath sounds.  Abdominal:     Tenderness: There is generalized abdominal tenderness.  Skin:    General: Skin is warm and dry.     Findings: No rash.  Psychiatric:        Attention and Perception: Attention normal.        Mood and Affect: Affect is flat.        Speech: Speech normal.        Behavior: Behavior is withdrawn.        Thought Content: Thought content normal.        Cognition and Memory: Cognition normal.        Judgment: Judgment is inappropriate.      Assessment and Plan Colitis, acute Continues to bother patient.  Can use tylenol for pain. Make sure taking lialda as recommended.  Protein-calorie malnutrition, severe Set container aside and make goal of 64 oz  A day. Make sure getting three meals a day with sncks add Boost supplements.   Hypotension  Hold Flomax unless  urinary retention given BP droppiong low. Push fluids as discussed.   Chest pressure Pt with history of CAD reports new chest pressure with exertion in last 24-48 hours.  Will contact cardiology for recommendations.   Low blood potassium Continue oral KCL and Mg.   Depression, major, recurrent ([place]) Appears to be poorly  controlled and may be associated with poor self care. INcrease venlafaxine.     Eliezer Lofts, MD

## 2019-08-23 NOTE — Telephone Encounter (Signed)
Patient's ex-wife,Gilda,left message for Dr.[name] about patient.  Message is in rx tower.

## 2019-08-23 NOTE — Telephone Encounter (Signed)
Note given to Dr. Diona Browner during [name]'s appointment.  [name] was brought back to room to discuss issues.

## 2019-08-23 NOTE — Telephone Encounter (Signed)
-----   Message from Josue Hector, MD sent at 08/23/2019 12:01 PM EDT ----- He was doing fine when I saw him in hospital July will have him seen in office  Pam- can you have him see me or NP in office next week or two  ----- Message ----- From: Jinny Sanders, MD Sent: 08/23/2019  10:56 AM EDT To: Josue Hector, MD    This mutual patient  with CAD s/p stents has been dealing with acute colitis.Marland Kitchen today in follow up he again is having lower BPs from likely GI losses and poor po intake. He now is having  mild exertional chest pressure lasting minutes a couple times today.. no EKG changes. I have had him hold flomax to get BP up, we discussed increasing fluids as he is only getting 8-10 oz a day. Do you recommend need for him to be seen by you?  Thanks, [employer]

## 2019-08-23 NOTE — Patient Instructions (Addendum)
Can use tylenol for pain.  Continue potassium and magnesium. Set container aside and make goal of 64 oz  A day. Make sure getting three meals a day with sncks add Boost supplements.  I will  chat with your cardiologist and let you know if we need to get  You in.  Hold Flomax unless   Urinary retention. INcrease velafaxine by adding an additional dose for

## 2019-08-25 ENCOUNTER — Telehealth: Payer: Self-pay | Admitting: Nurse Practitioner

## 2019-08-25 NOTE — Telephone Encounter (Signed)
Noted  

## 2019-08-25 NOTE — Telephone Encounter (Signed)
Allegra Grana left a voicemail regarding patient and abdomen distension.

## 2019-08-25 NOTE — Telephone Encounter (Signed)
Beth, pls call the patient, if he is having the jaw and chest pain with SOB he needs to go to the ER to get checked for cardiac ischemia.  He can take the Bentyl qid. He should be seen in the office asap after he gets his heart checked.

## 2019-08-25 NOTE — Telephone Encounter (Signed)
Patient spoke with me. The cardiac pain is not present.  He states he knows that sensation.  He has not had a bowel movement in 36 hours. No recent imodium. He is complaining of bloating and abdominal discomfort. He belches a lot when he gets up. He is drinking with a straw. He is not always upright to eat and drink because of the abdominal discomfort. On Lialda for 3 days now. No nausea or vomting.

## 2019-08-26 ENCOUNTER — Telehealth: Payer: Self-pay

## 2019-08-26 ENCOUNTER — Telehealth: Payer: Self-pay | Admitting: Cardiovascular Disease

## 2019-08-26 ENCOUNTER — Inpatient Hospital Stay (HOSPITAL_COMMUNITY)
Admission: EM | Admit: 2019-08-26 | Discharge: 2019-09-06 | DRG: 330 | Disposition: A | Discharge status: Home Health | Payer: Medicare HMO | Attending: Family Medicine | Admitting: Family Medicine

## 2019-08-26 ENCOUNTER — Encounter (HOSPITAL_COMMUNITY): Payer: Self-pay

## 2019-08-26 ENCOUNTER — Telehealth: Payer: Self-pay | Admitting: Gastroenterology

## 2019-08-26 DIAGNOSIS — R109 Unspecified abdominal pain: Secondary | ICD-10-CM

## 2019-08-26 DIAGNOSIS — Z4659 Encounter for fitting and adjustment of other gastrointestinal appliance and device: Secondary | ICD-10-CM | POA: Diagnosis not present

## 2019-08-26 DIAGNOSIS — Z811 Family history of alcohol abuse and dependence: Secondary | ICD-10-CM

## 2019-08-26 DIAGNOSIS — I152 Hypertension secondary to endocrine disorders: Secondary | ICD-10-CM | POA: Diagnosis present

## 2019-08-26 DIAGNOSIS — K56609 Unspecified intestinal obstruction, unspecified as to partial versus complete obstruction: Secondary | ICD-10-CM

## 2019-08-26 DIAGNOSIS — N179 Acute kidney failure, unspecified: Secondary | ICD-10-CM | POA: Diagnosis present

## 2019-08-26 DIAGNOSIS — Z66 Do not resuscitate: Secondary | ICD-10-CM | POA: Diagnosis present

## 2019-08-26 DIAGNOSIS — Z85828 Personal history of other malignant neoplasm of skin: Secondary | ICD-10-CM

## 2019-08-26 DIAGNOSIS — K5732 Diverticulitis of large intestine without perforation or abscess without bleeding: Principal | ICD-10-CM | POA: Diagnosis present

## 2019-08-26 DIAGNOSIS — R0602 Shortness of breath: Secondary | ICD-10-CM | POA: Diagnosis not present

## 2019-08-26 DIAGNOSIS — Z9842 Cataract extraction status, left eye: Secondary | ICD-10-CM

## 2019-08-26 DIAGNOSIS — I2510 Atherosclerotic heart disease of native coronary artery without angina pectoris: Secondary | ICD-10-CM | POA: Diagnosis present

## 2019-08-26 DIAGNOSIS — D649 Anemia, unspecified: Secondary | ICD-10-CM | POA: Diagnosis present

## 2019-08-26 DIAGNOSIS — R1033 Periumbilical pain: Secondary | ICD-10-CM | POA: Diagnosis not present

## 2019-08-26 DIAGNOSIS — Z955 Presence of coronary angioplasty implant and graft: Secondary | ICD-10-CM

## 2019-08-26 DIAGNOSIS — E785 Hyperlipidemia, unspecified: Secondary | ICD-10-CM | POA: Diagnosis present

## 2019-08-26 DIAGNOSIS — Z20822 Contact with and (suspected) exposure to covid-19: Secondary | ICD-10-CM | POA: Diagnosis present

## 2019-08-26 DIAGNOSIS — Z9841 Cataract extraction status, right eye: Secondary | ICD-10-CM

## 2019-08-26 DIAGNOSIS — E119 Type 2 diabetes mellitus without complications: Secondary | ICD-10-CM | POA: Diagnosis present

## 2019-08-26 DIAGNOSIS — R000 Tachycardia, unspecified: Secondary | ICD-10-CM | POA: Diagnosis not present

## 2019-08-26 DIAGNOSIS — K219 Gastro-esophageal reflux disease without esophagitis: Secondary | ICD-10-CM | POA: Diagnosis present

## 2019-08-26 DIAGNOSIS — Z818 Family history of other mental and behavioral disorders: Secondary | ICD-10-CM

## 2019-08-26 DIAGNOSIS — G4700 Insomnia, unspecified: Secondary | ICD-10-CM | POA: Diagnosis present

## 2019-08-26 DIAGNOSIS — Z87891 Personal history of nicotine dependence: Secondary | ICD-10-CM

## 2019-08-26 DIAGNOSIS — Z79899 Other long term (current) drug therapy: Secondary | ICD-10-CM

## 2019-08-26 DIAGNOSIS — Z7902 Long term (current) use of antithrombotics/antiplatelets: Secondary | ICD-10-CM

## 2019-08-26 DIAGNOSIS — K5792 Diverticulitis of intestine, part unspecified, without perforation or abscess without bleeding: Secondary | ICD-10-CM | POA: Diagnosis present

## 2019-08-26 DIAGNOSIS — K862 Cyst of pancreas: Secondary | ICD-10-CM | POA: Diagnosis present

## 2019-08-26 DIAGNOSIS — T85898A Other specified complication of other internal prosthetic devices, implants and grafts, initial encounter: Secondary | ICD-10-CM | POA: Diagnosis not present

## 2019-08-26 DIAGNOSIS — Z209 Contact with and (suspected) exposure to unspecified communicable disease: Secondary | ICD-10-CM | POA: Diagnosis not present

## 2019-08-26 DIAGNOSIS — R140 Abdominal distension (gaseous): Secondary | ICD-10-CM | POA: Diagnosis not present

## 2019-08-26 DIAGNOSIS — Z0189 Encounter for other specified special examinations: Secondary | ICD-10-CM

## 2019-08-26 DIAGNOSIS — F339 Major depressive disorder, recurrent, unspecified: Secondary | ICD-10-CM | POA: Diagnosis present

## 2019-08-26 DIAGNOSIS — R079 Chest pain, unspecified: Secondary | ICD-10-CM

## 2019-08-26 DIAGNOSIS — E876 Hypokalemia: Secondary | ICD-10-CM | POA: Diagnosis present

## 2019-08-26 DIAGNOSIS — E1159 Type 2 diabetes mellitus with other circulatory complications: Secondary | ICD-10-CM | POA: Diagnosis present

## 2019-08-26 DIAGNOSIS — Y92234 Operating room of hospital as the place of occurrence of the external cause: Secondary | ICD-10-CM | POA: Diagnosis not present

## 2019-08-26 DIAGNOSIS — Y732 Prosthetic and other implants, materials and accessory gastroenterology and urology devices associated with adverse incidents: Secondary | ICD-10-CM | POA: Diagnosis not present

## 2019-08-26 DIAGNOSIS — I10 Essential (primary) hypertension: Secondary | ICD-10-CM | POA: Diagnosis present

## 2019-08-26 DIAGNOSIS — G4733 Obstructive sleep apnea (adult) (pediatric): Secondary | ICD-10-CM | POA: Diagnosis present

## 2019-08-26 DIAGNOSIS — R0789 Other chest pain: Secondary | ICD-10-CM | POA: Diagnosis not present

## 2019-08-26 DIAGNOSIS — F419 Anxiety disorder, unspecified: Secondary | ICD-10-CM | POA: Diagnosis present

## 2019-08-26 DIAGNOSIS — Z8249 Family history of ischemic heart disease and other diseases of the circulatory system: Secondary | ICD-10-CM

## 2019-08-26 LAB — BASIC METABOLIC PANEL
Anion gap: 11 (ref 5–15)
BUN: 11 mg/dL (ref 8–23)
CO2: 20 mmol/L — ABNORMAL LOW (ref 22–32)
Calcium: 8.8 mg/dL — ABNORMAL LOW (ref 8.9–10.3)
Chloride: 104 mmol/L (ref 98–111)
Creatinine, Ser: 1.05 mg/dL (ref 0.61–1.24)
GFR calc Af Amer: >60 mL/min (ref >60)
GFR calc non Af Amer: >60 mL/min (ref >60)
Glucose, Bld: 129 mg/dL — ABNORMAL HIGH (ref 70–99)
Potassium: 4.6 mmol/L (ref 3.5–5.1)
Sodium: 135 mmol/L (ref 135–145)

## 2019-08-26 LAB — TROPONIN I (HIGH SENSITIVITY)
Troponin I (High Sensitivity): 4 ng/L (ref <18)
Troponin I (High Sensitivity): 5 ng/L (ref <18)

## 2019-08-26 LAB — CBC
HCT: 42.8 % (ref 39.0–52.0)
Hemoglobin: 14.3 g/dL (ref 13.0–17.0)
MCH: 29.6 pg (ref 26.0–34.0)
MCHC: 33.4 g/dL (ref 30.0–36.0)
MCV: 88.6 fL (ref 80.0–100.0)
Platelets: 471 10*3/uL — ABNORMAL HIGH (ref 150–400)
RBC: 4.83 MIL/uL (ref 4.22–5.81)
RDW: 14.5 % (ref 11.5–15.5)
WBC: 7.2 10*3/uL (ref 4.0–10.5)
nRBC: 0 % (ref 0.0–0.2)

## 2019-08-26 LAB — CBG MONITORING, ED: Glucose-Capillary: 123 mg/dL — ABNORMAL HIGH (ref 70–99)

## 2019-08-26 NOTE — ED Triage Notes (Signed)
Pt BIB [facility] for eval of chest pain  Onset this AM at 1000. Pt took 1 SL home NTG w/ total resolution of CP. Reports ongoing weakness since awakening this morning. SOB as well. Pt also reports chronic abd pain.

## 2019-08-26 NOTE — Telephone Encounter (Signed)
Patient's wife returning call. 

## 2019-08-26 NOTE — Telephone Encounter (Signed)
Pt's niece Claiborne Billings (not on [place]) pt has cannot speak to NO ONE on DPR.... She called requesting someone call pt as they have already sent msg via mychart on what is the next step, that the family feels that pt is not doing well... msg has been sent to GI and Cardiology and they have been going back and forth with pt's wife as well

## 2019-08-26 NOTE — Telephone Encounter (Signed)
I have discussed in detail with Timoteo Expose (patient's ex-wife) extensively, who happens to be a BSN.  Had sudden onset of shortness of breath Thursday night. Significant sudden change.  Had some abdominal wall swelling which is better.  Having bowel movements.  No abdominal pain.  She is waiting to hear from cardiology office.  Roy [name] continues to be short of breath even at rest especially when he talks.  He gets more short of breath with minimal exertion.  Plan: -I have advised her to get him to nearest ED which is [facility].  Can call ambulance if she is not able to take him.  She wants to wait for another hour until she hears from cardiology office. -We would be more than happy to see him as inpatient or as an outpatient.  I do not think it is a GI problem currently.  -I have reviewed Dr. Kyla Balzarine previous note who has also recommended further cardiology work-up.  RG

## 2019-08-26 NOTE — Telephone Encounter (Signed)
   Pt c/o of Chest Pain: STAT if CP now or developed within 24 hours  1. Are you having CP right now? No   2. Are you experiencing any other symptoms (ex. SOB, nausea, vomiting, sweating)?   3. How long have you been experiencing CP? Since Monday at PCP  4. Is your CP continuous or coming and going? When getting up to take just a couple steps  5. Have you taken Nitroglycerin? Once today at 10:45 am ?   Patient was hospitalized 2x in the past month. The patient went to his PCP Monday and had some SOB and Chest Pain on exertion. He also had an extreme amount of abdominal fluid Thursday morning. The PCP and the GI Doctor were hoping Dr. Johnsie Cancel would find a way to connect with each other to discuss the patient's symptoms and issues.

## 2019-08-26 NOTE — Telephone Encounter (Signed)
Patients wife called to follow up on previous message please advise

## 2019-08-26 NOTE — Telephone Encounter (Signed)
I placed call to patient. Left message to call office.  --(see previous phone note. Patient needs appointment).

## 2019-08-26 NOTE — Telephone Encounter (Signed)
Pam - if he's having accelerated angina have him go to ER or be seen by DOD today needs cath

## 2019-08-26 NOTE — Telephone Encounter (Signed)
Pt now in ED as is advisable given rapid decline and concerning cardiac issues. Left message on nieces phone Re: agree with GI and Cardiology recommendations on urgent ER visit for cardiac assessment given decline.

## 2019-08-26 NOTE — Telephone Encounter (Signed)
I spoke with patient who gave me permission to speak with Gilda.  I told her we had received message on 8/10 from Dr Diona Browner that patient needed appointment with Dr Johnsie Cancel.  [name] reports patient has had a significant change since Monday.  He was fine Wednesday night but on Thursday he developed abdominal distention and shortness of breath on exertion.  He is having chest pain going to jaw with any exertion.  Cannot take 2 steps without having pain. Took NTG earlier today.  Trouble talking due to shortness of breath.   I advised Gilda to call 911 and have patient transported to hospital

## 2019-08-26 NOTE — Telephone Encounter (Signed)
Refer to prior msg 8/12. Pt reported having CP which radiated across his chest, arms and jaw. Known hx of CAD. I attempted to call the patient and I left a detailed msg on his voicemail to go to the local ER if he is having active CP, jaw pain and SOB and to contact his cardiologist Dr. Johnsie Cancel.

## 2019-08-26 NOTE — Telephone Encounter (Signed)
Proceed with cardiac evaluation first as per Dr. Vikki Ports

## 2019-08-27 ENCOUNTER — Other Ambulatory Visit: Payer: Self-pay

## 2019-08-27 ENCOUNTER — Emergency Department (HOSPITAL_COMMUNITY): Payer: Medicare HMO

## 2019-08-27 ENCOUNTER — Inpatient Hospital Stay (HOSPITAL_COMMUNITY): Payer: Medicare HMO

## 2019-08-27 DIAGNOSIS — E876 Hypokalemia: Secondary | ICD-10-CM | POA: Diagnosis present

## 2019-08-27 DIAGNOSIS — K56609 Unspecified intestinal obstruction, unspecified as to partial versus complete obstruction: Secondary | ICD-10-CM | POA: Insufficient documentation

## 2019-08-27 DIAGNOSIS — Y732 Prosthetic and other implants, materials and accessory gastroenterology and urology devices associated with adverse incidents: Secondary | ICD-10-CM | POA: Diagnosis not present

## 2019-08-27 DIAGNOSIS — F339 Major depressive disorder, recurrent, unspecified: Secondary | ICD-10-CM | POA: Diagnosis not present

## 2019-08-27 DIAGNOSIS — R933 Abnormal findings on diagnostic imaging of other parts of digestive tract: Secondary | ICD-10-CM

## 2019-08-27 DIAGNOSIS — F331 Major depressive disorder, recurrent, moderate: Secondary | ICD-10-CM

## 2019-08-27 DIAGNOSIS — K8689 Other specified diseases of pancreas: Secondary | ICD-10-CM | POA: Diagnosis not present

## 2019-08-27 DIAGNOSIS — K6389 Other specified diseases of intestine: Secondary | ICD-10-CM | POA: Diagnosis not present

## 2019-08-27 DIAGNOSIS — G4733 Obstructive sleep apnea (adult) (pediatric): Secondary | ICD-10-CM | POA: Diagnosis not present

## 2019-08-27 DIAGNOSIS — Z66 Do not resuscitate: Secondary | ICD-10-CM | POA: Diagnosis not present

## 2019-08-27 DIAGNOSIS — K5669 Other partial intestinal obstruction: Secondary | ICD-10-CM | POA: Diagnosis not present

## 2019-08-27 DIAGNOSIS — K862 Cyst of pancreas: Secondary | ICD-10-CM | POA: Diagnosis not present

## 2019-08-27 DIAGNOSIS — K5792 Diverticulitis of intestine, part unspecified, without perforation or abscess without bleeding: Secondary | ICD-10-CM | POA: Diagnosis not present

## 2019-08-27 DIAGNOSIS — Y92234 Operating room of hospital as the place of occurrence of the external cause: Secondary | ICD-10-CM | POA: Diagnosis not present

## 2019-08-27 DIAGNOSIS — R000 Tachycardia, unspecified: Secondary | ICD-10-CM | POA: Diagnosis present

## 2019-08-27 DIAGNOSIS — F419 Anxiety disorder, unspecified: Secondary | ICD-10-CM | POA: Diagnosis present

## 2019-08-27 DIAGNOSIS — Z79899 Other long term (current) drug therapy: Secondary | ICD-10-CM | POA: Diagnosis not present

## 2019-08-27 DIAGNOSIS — N179 Acute kidney failure, unspecified: Secondary | ICD-10-CM | POA: Diagnosis not present

## 2019-08-27 DIAGNOSIS — Z9842 Cataract extraction status, left eye: Secondary | ICD-10-CM | POA: Diagnosis not present

## 2019-08-27 DIAGNOSIS — D649 Anemia, unspecified: Secondary | ICD-10-CM | POA: Diagnosis not present

## 2019-08-27 DIAGNOSIS — Z955 Presence of coronary angioplasty implant and graft: Secondary | ICD-10-CM | POA: Diagnosis not present

## 2019-08-27 DIAGNOSIS — K56601 Complete intestinal obstruction, unspecified as to cause: Secondary | ICD-10-CM | POA: Diagnosis not present

## 2019-08-27 DIAGNOSIS — K5720 Diverticulitis of large intestine with perforation and abscess without bleeding: Secondary | ICD-10-CM | POA: Diagnosis not present

## 2019-08-27 DIAGNOSIS — I2510 Atherosclerotic heart disease of native coronary artery without angina pectoris: Secondary | ICD-10-CM

## 2019-08-27 DIAGNOSIS — Z7902 Long term (current) use of antithrombotics/antiplatelets: Secondary | ICD-10-CM | POA: Diagnosis not present

## 2019-08-27 DIAGNOSIS — Z4682 Encounter for fitting and adjustment of non-vascular catheter: Secondary | ICD-10-CM | POA: Diagnosis not present

## 2019-08-27 DIAGNOSIS — E785 Hyperlipidemia, unspecified: Secondary | ICD-10-CM | POA: Diagnosis not present

## 2019-08-27 DIAGNOSIS — Z8719 Personal history of other diseases of the digestive system: Secondary | ICD-10-CM | POA: Diagnosis not present

## 2019-08-27 DIAGNOSIS — K219 Gastro-esophageal reflux disease without esophagitis: Secondary | ICD-10-CM | POA: Diagnosis present

## 2019-08-27 DIAGNOSIS — E119 Type 2 diabetes mellitus without complications: Secondary | ICD-10-CM | POA: Diagnosis not present

## 2019-08-27 DIAGNOSIS — Z87891 Personal history of nicotine dependence: Secondary | ICD-10-CM | POA: Diagnosis not present

## 2019-08-27 DIAGNOSIS — I10 Essential (primary) hypertension: Secondary | ICD-10-CM | POA: Diagnosis not present

## 2019-08-27 DIAGNOSIS — R1033 Periumbilical pain: Secondary | ICD-10-CM | POA: Diagnosis present

## 2019-08-27 DIAGNOSIS — R109 Unspecified abdominal pain: Secondary | ICD-10-CM | POA: Diagnosis not present

## 2019-08-27 DIAGNOSIS — K5732 Diverticulitis of large intestine without perforation or abscess without bleeding: Secondary | ICD-10-CM | POA: Diagnosis not present

## 2019-08-27 DIAGNOSIS — Z20822 Contact with and (suspected) exposure to covid-19: Secondary | ICD-10-CM | POA: Diagnosis not present

## 2019-08-27 DIAGNOSIS — R69 Illness, unspecified: Secondary | ICD-10-CM | POA: Diagnosis not present

## 2019-08-27 DIAGNOSIS — K5730 Diverticulosis of large intestine without perforation or abscess without bleeding: Secondary | ICD-10-CM | POA: Diagnosis not present

## 2019-08-27 DIAGNOSIS — G4700 Insomnia, unspecified: Secondary | ICD-10-CM | POA: Diagnosis present

## 2019-08-27 DIAGNOSIS — J9811 Atelectasis: Secondary | ICD-10-CM | POA: Diagnosis not present

## 2019-08-27 LAB — URINALYSIS, ROUTINE W REFLEX MICROSCOPIC
Glucose, UA: NEGATIVE mg/dL
Hgb urine dipstick: NEGATIVE
Ketones, ur: NEGATIVE mg/dL
Leukocytes,Ua: NEGATIVE
Nitrite: NEGATIVE
Protein, ur: NEGATIVE mg/dL
Specific Gravity, Urine: 1.028 (ref 1.005–1.030)
pH: 5 (ref 5.0–8.0)

## 2019-08-27 LAB — SARS CORONAVIRUS 2 BY RT PCR (HOSPITAL ORDER, PERFORMED IN ~~LOC~~ HOSPITAL LAB): SARS Coronavirus 2: NEGATIVE

## 2019-08-27 LAB — CBG MONITORING, ED: Glucose-Capillary: 126 mg/dL — ABNORMAL HIGH (ref 70–99)

## 2019-08-27 LAB — LACTIC ACID, PLASMA: Lactic Acid, Venous: 2 mmol/L (ref 0.5–1.9)

## 2019-08-27 MED ORDER — PIPERACILLIN-TAZOBACTAM 3.375 G IVPB 30 MIN
3.3750 g | Freq: Once | INTRAVENOUS | Status: AC
  Administered 2019-08-27: 3.375 g via INTRAVENOUS
  Filled 2019-08-27: qty 50

## 2019-08-27 MED ORDER — ONDANSETRON HCL 4 MG/2ML IJ SOLN: 4.0000 mg | Freq: Four times a day (QID) | INTRAMUSCULAR | Status: DC | PRN

## 2019-08-27 MED ORDER — PANTOPRAZOLE SODIUM 40 MG IV SOLR
40.0000 mg | INTRAVENOUS | Status: DC
  Administered 2019-08-27 – 2019-09-04 (×9): 40 mg via INTRAVENOUS
  Filled 2019-08-27 (×9): qty 40

## 2019-08-27 MED ORDER — CARVEDILOL 12.5 MG PO TABS
12.5000 mg | ORAL_TABLET | Freq: Two times a day (BID) | ORAL | Status: DC
  Filled 2019-08-27: qty 1

## 2019-08-27 MED ORDER — HYDRALAZINE HCL 20 MG/ML IJ SOLN: 10.0000 mg | Freq: Four times a day (QID) | INTRAMUSCULAR | Status: DC | PRN

## 2019-08-27 MED ORDER — ONDANSETRON HCL 4 MG/2ML IJ SOLN
4.0000 mg | Freq: Once | INTRAMUSCULAR | Status: AC
  Administered 2019-08-27: 4 mg via INTRAVENOUS
  Filled 2019-08-27: qty 2

## 2019-08-27 MED ORDER — SODIUM CHLORIDE 0.9 % IV SOLN: INTRAVENOUS | Status: DC

## 2019-08-27 MED ORDER — DIATRIZOATE MEGLUMINE & SODIUM 66-10 % PO SOLN
90.0000 mL | Freq: Once | ORAL | Status: DC
  Filled 2019-08-27: qty 90

## 2019-08-27 MED ORDER — ONDANSETRON HCL 4 MG PO TABS: 4.0000 mg | ORAL_TABLET | Freq: Four times a day (QID) | ORAL | Status: DC | PRN

## 2019-08-27 MED ORDER — FENTANYL CITRATE (PF) 100 MCG/2ML IJ SOLN
50.0000 ug | Freq: Once | INTRAMUSCULAR | Status: AC
  Administered 2019-08-27: 50 ug via INTRAVENOUS
  Filled 2019-08-27: qty 2

## 2019-08-27 MED ORDER — SODIUM CHLORIDE 0.9% FLUSH
3.0000 mL | Freq: Two times a day (BID) | INTRAVENOUS | Status: DC
  Administered 2019-08-27 – 2019-09-06 (×14): 3 mL via INTRAVENOUS

## 2019-08-27 MED ORDER — IOHEXOL 300 MG/ML  SOLN
100.0000 mL | Freq: Once | INTRAMUSCULAR | Status: AC | PRN
  Administered 2019-08-27: 100 mL via INTRAVENOUS

## 2019-08-27 MED ORDER — PIPERACILLIN-TAZOBACTAM 3.375 G IVPB
3.3750 g | Freq: Three times a day (TID) | INTRAVENOUS | Status: DC
  Administered 2019-08-27 – 2019-09-01 (×13): 3.375 g via INTRAVENOUS
  Filled 2019-08-27 (×14): qty 50

## 2019-08-27 MED ORDER — NITROGLYCERIN 0.4 MG SL SUBL: 0.4000 mg | SUBLINGUAL_TABLET | SUBLINGUAL | Status: DC | PRN

## 2019-08-27 MED ORDER — MORPHINE SULFATE (PF) 2 MG/ML IV SOLN
2.0000 mg | INTRAVENOUS | Status: DC | PRN
  Administered 2019-08-29 – 2019-08-30 (×2): 2 mg via INTRAVENOUS
  Filled 2019-08-27 (×2): qty 1

## 2019-08-27 NOTE — Consult Note (Addendum)
Referring Provider:  [facility]         Primary Care Physician:  Jinny Sanders, MD Primary Gastroenterologist: Dr. Lyndel Safe            We were asked to see this patient for:  Bowel obstruction / diverticulitis                ASSESSMENT /  PLAN    CC: Abdominal, nausea and vomiting   Roy [name] is a 74 y.o. male PMH significant for, but not necessarily limited to,   hypertension, hyperlipidemia, CAD status post stent remote stenting, on Plavix, sleep apnea .                                                                                                                                   #Recurrent abdominal pain, nausea and vomiting --repeat CT scan sigmoid inflammation with associated colonic obstruction / small bowel dilation.  --No malignancy on colonoscopy for same symptoms and radiologic findings last month.  This is likely ongoing/unresolved diverticulitis. --Surgery has evaluated and queries ischemic versus inflammatory process and suggesting follow up sigmoidoscopy. Marland Kitchen --On Zosyn --Continue NGT decompression.  --Let's see how he does with Zosyn. Reevaluate tomorrow regarding need for repeat lower endoscopy.   # chest discomfort / hx of CAD with stents.  --troponin normal.   # Cystic lesion in the tail of the pancreas, indeterminate on imaging --outpatient workup if indicated   # Recent elevation in LFTs --outpatient workup in progress.    HPI:    Chief Complaint: Abdominal pain, nausea and vomiting  Roy [name] is a 74 y.o. male who we first met in the hospital last month.  At that time we evaluated him for abdominal pain, altered bowel habits (mainly diarrhea) some vomiting and an abnormal CT scan showing a 5 cm nearly completely obstructing lesion in the sigmoid colon.  Patient was passing gas/stool so we felt he would be able to prep.  Otherwise the plan would be an attempt at colonic stent placement if surgery agreed.  Lower endoscopy on 07/26/2019  showed congested mucosa at the proximal sigmoid colon.  There was left-sided diverticulosis.  No mass confirmed.  Pathology showed benign mucosa with lamina propria edema, no colitis.  The overall picture/findings was perplexing.  There was a question of whether not lisinopril may have caused him got angioedema.  Repeat CT scan showed interval improvement .  His GI symptoms improved.  During that admission his LFTs were noted to be elevated.  AST 50 -> 137. ALT 73 -> 192.  T. Bili and Alk phos levels were normal.  Acute hepatitis panel was negative.  He was given close outpatient follow-up  Patient already had a follow-up appointment scheduled so he just kept that one as his hospital follow-up though it was only just a couple of days after hospital discharge.  He was seen on 07/29/2019 .  He had had some bouts of diarrhea the day prior  Stool studies and labs were ordered.  Patient was advised to push fluids and go to the ED if symptoms worsened.      PCP and our office were in contact 08/02/2019.  Patient's white count was elevated, blood pressure low.  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  Due to his symptoms and all of these findings he underwent stat CTA of the abdomen and pelvis on 08/03/2019 with findings of colitis, sigmoid mass with associated luminal narrowing as described on the prior CT scan.  There was interval improvement and resolution of the previously seen bowel dilation.  Patient was readmitted to the hospital 08/10/2019 with recurrent abdominal pain and loose stools as well as nausea and vomiting.  His white count was mildly elevated.  Lactate was elevated but improved after fluids.  He was hypokalemic.  Noncontrast CT scan showed persistent wall thickening within the sigmoid colon with more proximal fluid retention in the colon and distal small bowel dilation  consistent with a partial colonic obstruction.  Changes felt to be likely related to diverticulitis.  He was started on Lialda empirically for possible SCAD.  Fecal calprotectin 197. He was given Imodium to use as needed.  Within a day or 2 he was tolerating diet.  Diarrhea was improving on Lialda and Imodium.  Plan was for outpatient colonoscopy in 8 weeks for evaluation of mucosal healing and also colorectal cancer screening.. That admission his infectious work-up was negative, antibiotics were stopped.  Patient presented to the ED again today for abdominal pain, nausea and vomiting.  He has not been having diarrhea.  Last bowel movement was yesterday afternoon, described as muddy.  He describes weight loss with poor appetite.  The abdominal pain is achy, sharp and stabbing at times.  CT scan of the abdomen and pelvis with contrast today shows bowel wall thickening and surrounding fat stranding involving the sigmoid colon associated with diverticula in this area.  Findings felt to represent sigmoid diverticulitis.  There is bowel distention with air-fluid levels evolving the proximal large and small bowel consistent with bowel obstruction.  Degree of obstruction has increased since 08/10/2019 imaging.       PREVIOUS ENDOSCOPIC EVALUATIONS / GI STUDIES :  Past Medical History:  Diagnosis Date  . Basal cell carcinoma    Bilateral Legs  . CAD (coronary artery disease)    a.  stent to the RCA in 2001;  b.  Negative Myoview in January 2012;  c.  LHC 4/16:  dLM 20, small D1 50-70, D2 sub-total, oLCx 50-70, RCA stent ok, dPLB 90, EF 35-40% >> DES to D2  . Depression   . History of echocardiogram    a.  Echo 4/16: Mild LVH, EF 96-28%, grade 1 diastolic dysfunction, normal wall motion, MAC  . HLD (hyperlipidemia)   . HTN (hypertension)   . OSA (obstructive sleep apnea)   . Vertigo     Past Surgical History:  Procedure Laterality Date  . BIOPSY  07/26/2019   Procedure: BIOPSY;  Surgeon: Gatha Mayer, MD;  Location: [facility];  Service: Endoscopy;;  . CATARACT EXTRACTION, BILATERAL    . COLONOSCOPY N/A 07/26/2019   Procedure: COLONOSCOPY;  Surgeon: Gatha Mayer, MD;  Location: [facility] ENDOSCOPY;  Service: Endoscopy;  Laterality: N/A;  . COLONOSCOPY    . CORONARY  STENT PLACEMENT  2001    RCA  . LEFT HEART CATHETERIZATION WITH CORONARY ANGIOGRAM N/A 02/13/2012   Procedure: LEFT HEART CATHETERIZATION WITH CORONARY ANGIOGRAM;  Surgeon: [name], MD;  Location: [facility] CATH LAB;  Service: Cardiovascular;  Laterality: N/A;  . LEFT HEART CATHETERIZATION WITH CORONARY ANGIOGRAM N/A 04/24/2014   Procedure: LEFT HEART CATHETERIZATION WITH CORONARY ANGIOGRAM;  Surgeon: [name], MD;  Location: [facility] CATH LAB;  Service: Cardiovascular;  Laterality: N/A;  . TONSILLECTOMY      Prior to Admission medications   Medication Sig Start Date End Date Taking? Authorizing Provider  carvedilol (COREG) 12.5 MG tablet TAKE 1 TABLET BY MOUTH 2 TIMES DAILY. Patient taking differently: Take 12.5 mg by mouth 2 (two) times daily with a meal.  08/22/19  Yes Josue Hector, MD  clopidogrel (PLAVIX) 75 MG tablet TAKE 1 TABLET BY MOUTH EVERY DAY Patient taking differently: Take 75 mg by mouth daily.  10/12/18  Yes Josue Hector, MD  dicyclomine (BENTYL) 20 MG tablet Take 1 tablet (20 mg total) by mouth every 6 (six) hours as needed (abdominal pain). 07/28/19  Yes Aline August, MD  loperamide (IMODIUM) 2 MG capsule Take 1 capsule (2 mg total) by mouth every 6 (six) hours as needed for diarrhea or loose stools. 08/15/19  Yes Shelly Coss, MD  Magnesium Oxide 400 MG CAPS Take 1 capsule (400 mg total) by mouth daily. 08/15/19  Yes Shelly Coss, MD  mesalamine (LIALDA) 1.2 g EC tablet Take 4 tablets (4.8 g total) by mouth daily with breakfast. 08/16/19  Yes Adhikari, Tamsen Meek, MD  nitroGLYCERIN (NITROSTAT) 0.4 MG SL tablet Place 1 tablet (0.4 mg total) under the tongue every 5 (five) minutes x 3 doses as needed for  chest pain. 07/28/19  Yes Aline August, MD  omeprazole (PRILOSEC) 20 MG capsule TAKE 1 CAPSULE BY MOUTH EVERY DAY Patient taking differently: Take 20 mg by mouth daily.  08/22/19  Yes Josue Hector, MD  ondansetron (ZOFRAN) 4 MG tablet Take 1 tablet (4 mg total) by mouth every 8 (eight) hours as needed for nausea or vomiting. 07/01/19  Yes [name], [name], MD  potassium chloride 20 MEQ TBCR Take 20 mEq by mouth daily. Take 2 pills (40 mEq) for a week then continue taking 20 mEq daily.Check potassium  level in a week Patient taking differently: Take 20 mEq by mouth daily. 20 mEq, Oral, Daily, Take 2 pills (40 mEq) for a week then continue taking 20 mEq daily.Check potassium  level in a week 08/15/19  Yes Adhikari, Tamsen Meek, MD  venlafaxine XR (EFFEXOR-XR) 37.5 MG 24 hr capsule Take in addition to 75 mg tablets Patient taking differently: Take 37.5 mg by mouth See admin instructions. Take in addition to 75 mg tablets 08/23/19  Yes [name], [name], MD  venlafaxine XR (EFFEXOR-XR) 75 MG 24 hr capsule TAKE 1 CAPSULE (75 MG TOTAL) BY MOUTH DAILY WITH BREAKFAST. 06/20/19  Yes [name], [name], MD    Current Facility-Administered Medications  Medication Dose Route Frequency Provider Last Rate Last Admin  . 0.9 %  sodium chloride infusion   Intravenous Continuous Agbata, Tochukwu, MD      . carvedilol (COREG) tablet 12.5 mg  12.5 mg Oral BID WC Agbata, Tochukwu, MD      . diatrizoate meglumine-sodium (GASTROGRAFIN) 66-10 % solution 90 mL  90 mL Per NG tube Once Fondaw, Wylder S, PA      . hydrALAZINE (APRESOLINE) injection 10 mg  10 mg Intravenous  Q6H PRN Agbata, Tochukwu, MD      . morphine 2 MG/ML injection 2 mg  2 mg Intravenous Q4H PRN Agbata, Tochukwu, MD      . nitroGLYCERIN (NITROSTAT) SL tablet 0.4 mg  0.4 mg Sublingual Q5 Min x 3 PRN Agbata, Tochukwu, MD      . ondansetron (ZOFRAN) tablet 4 mg  4 mg Oral Q6H PRN Agbata, Tochukwu, MD       Or  . ondansetron (ZOFRAN) injection 4 mg  4 mg Intravenous Q6H PRN  Agbata, Tochukwu, MD      . pantoprazole (PROTONIX) injection 40 mg  40 mg Intravenous Q24H Agbata, Tochukwu, MD      . piperacillin-tazobactam (ZOSYN) IVPB 3.375 g  3.375 g Intravenous Once Duanne Limerick, RPH       Followed by  . piperacillin-tazobactam (ZOSYN) IVPB 3.375 g  3.375 g Intravenous Q8H Eakins, Jason, RPH      . sodium chloride flush (NS) 0.9 % injection 3 mL  3 mL Intravenous Q12H Agbata, Tochukwu, MD       Current Outpatient Medications  Medication Sig Dispense Refill  . carvedilol (COREG) 12.5 MG tablet TAKE 1 TABLET BY MOUTH 2 TIMES DAILY. (Patient taking differently: Take 12.5 mg by mouth 2 (two) times daily with a meal. ) 60 tablet 0  . clopidogrel (PLAVIX) 75 MG tablet TAKE 1 TABLET BY MOUTH EVERY DAY (Patient taking differently: Take 75 mg by mouth daily. ) 90 tablet 3  . dicyclomine (BENTYL) 20 MG tablet Take 1 tablet (20 mg total) by mouth every 6 (six) hours as needed (abdominal pain). 30 tablet 0  . loperamide (IMODIUM) 2 MG capsule Take 1 capsule (2 mg total) by mouth every 6 (six) hours as needed for diarrhea or loose stools. 30 capsule 0  . Magnesium Oxide 400 MG CAPS Take 1 capsule (400 mg total) by mouth daily. 30 capsule 0  . mesalamine (LIALDA) 1.2 g EC tablet Take 4 tablets (4.8 g total) by mouth daily with breakfast. 120 tablet 1  . nitroGLYCERIN (NITROSTAT) 0.4 MG SL tablet Place 1 tablet (0.4 mg total) under the tongue every 5 (five) minutes x 3 doses as needed for chest pain. 30 tablet 0  . omeprazole (PRILOSEC) 20 MG capsule TAKE 1 CAPSULE BY MOUTH EVERY DAY (Patient taking differently: Take 20 mg by mouth daily. ) 30 capsule 0  . ondansetron (ZOFRAN) 4 MG tablet Take 1 tablet (4 mg total) by mouth every 8 (eight) hours as needed for nausea or vomiting. 20 tablet 0  . potassium chloride 20 MEQ TBCR Take 20 mEq by mouth daily. Take 2 pills (40 mEq) for a week then continue taking 20 mEq daily.Check potassium  level in a week (Patient taking differently: Take 20  mEq by mouth daily. 20 mEq, Oral, Daily, Take 2 pills (40 mEq) for a week then continue taking 20 mEq daily.Check potassium  level in a week) 60 tablet 0  . venlafaxine XR (EFFEXOR-XR) 37.5 MG 24 hr capsule Take in addition to 75 mg tablets (Patient taking differently: Take 37.5 mg by mouth See admin instructions. Take in addition to 75 mg tablets) 30 capsule 5  . venlafaxine XR (EFFEXOR-XR) 75 MG 24 hr capsule TAKE 1 CAPSULE (75 MG TOTAL) BY MOUTH DAILY WITH BREAKFAST. 90 capsule 1    Allergies as of 08/26/2019  . (No Known Allergies)    Family History  Problem Relation Age of Onset  . Cirrhosis Mother   . Alcohol abuse  Mother   . Heart disease Father   . Hypertension Father   . Peripheral vascular disease Father   . Depression Sister   . Heart attack Neg Hx   . Stroke Neg Hx   . Stomach cancer Neg Hx   . Pancreatic cancer Neg Hx   . Colon cancer Neg Hx   . Esophageal cancer Neg Hx     Social History   Socioeconomic History  . Marital status: Divorced    Spouse name: Not on file  . Number of children: Not on file  . Years of education: Not on file  . Highest education level: Not on file  Occupational History  . Not on file  Tobacco Use  . Smoking status: Former Smoker    Packs/day: 1.00    Years: 25.00    Pack years: 25.00    Types: Cigarettes    Quit date: 01/14/2000    Years since quitting: 19.6  . Smokeless tobacco: Never Used  Vaping Use  . Vaping Use: Never used  Substance and Sexual Activity  . Alcohol use: Yes    Alcohol/week: 3.0 standard drinks    Types: 1 Cans of beer, 2 Shots of liquor per week  . Drug use: No  . Sexual activity: Not Currently  Other Topics Concern  . 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   Social Determinants of Health   Financial Resource Strain:   . Difficulty of Paying Living Expenses:   Food Insecurity:   . Worried  About Charity fundraiser in the Last Year:   . Arboriculturist in the Last Year:   Transportation Needs:   . Film/video editor (Medical):   Marland Kitchen Lack of Transportation (Non-Medical):   Physical Activity:   . Days of Exercise per Week:   . Minutes of Exercise per Session:   Stress:   . Feeling of Stress :   Social Connections:   . Frequency of Communication with Friends and Family:   . Frequency of Social Gatherings with Friends and Family:   . Attends Religious Services:   . Active Member of Clubs or Organizations:   . Attends Archivist Meetings:   Marland Kitchen Marital Status:   Intimate Partner Violence:   . Fear of Current or Ex-Partner:   . Emotionally Abused:   Marland Kitchen Physically Abused:   . Sexually Abused:     Review of Systems: All systems reviewed and negative except where noted in HPI.  Physical Exam: Vital signs in last 24 hours: Temp:  [98.5 F (36.9 C)] 98.5 F (36.9 C) (08/13 1759) Pulse Rate:  [80-106] 89 (08/14 1430) Resp:  [12-17] 16 (08/14 1430) BP: (96-128)/(82-98) 122/88 (08/14 1430) SpO2:  [94 %-100 %] 95 % (08/14 1430) Weight:  [68 kg-72 kg] 68 kg (08/13 1759)   General:   Alert, well-developed,  male in NAD.  NG tube to LIS Psych:  Pleasant, cooperative. Normal mood and affect. Eyes:  Pupils equal, sclera clear, no icterus.   Conjunctiva pink. Ears:  Normal auditory acuity. Nose:  No deformity, discharge,  or lesions. Neck:  Supple; no masses Lungs: RLL crackles, lungs otherwise clear   Heart:  Regular rate and rhythm;  no lower extremity edema Abdomen:  Soft, moderately distended, tympanic with abnormal bowel sounds.  Not significantly tender.    Rectal:  Deferred  Msk:  Symmetrical without gross deformities. . Neurologic:  Alert and  oriented x4;  grossly normal neurologically. Skin:  Intact without significant lesions or rashes.   Intake/Output from previous day: No intake/output data recorded. Intake/Output this shift: Total I/O In: -   Out: 200 [Emesis/NG output:200]  Lab Results: Recent Labs    08/26/19 1804  WBC 7.2  HGB 14.3  HCT 42.8  PLT 471*   BMET Recent Labs    08/26/19 1804  NA 135  K 4.6  CL 104  CO2 20*  GLUCOSE 129*  BUN 11  CREATININE 1.05  CALCIUM 8.8*   LFT No results for input(s): PROT, ALBUMIN, AST, ALT, ALKPHOS, BILITOT, BILIDIR, IBILI in the last 72 hours. PT/INR No results for input(s): LABPROT, INR in the last 72 hours. Hepatitis Panel No results for input(s): HEPBSAG, HCVAB, HEPAIGM, HEPBIGM in the last 72 hours.   . CBC Latest Ref Rng & Units 08/26/2019 08/11/2019 08/10/2019  WBC 4.0 - 10.5 K/uL 7.2 6.9 10.7(H)  Hemoglobin 13.0 - 17.0 g/dL 14.3 11.2(L) 14.7  Hematocrit 39 - 52 % 42.8 32.4(L) 43.9  Platelets 150 - 400 K/uL 471(H) 245 462(H)    . CMP Latest Ref Rng & Units 08/26/2019 08/15/2019 08/14/2019  Glucose 70 - 99 mg/dL 129(H) 115(H) 110(H)  BUN 8 - 23 mg/dL 11 6(L) 9  Creatinine 0.61 - 1.24 mg/dL 1.05 0.74 0.90  Sodium 135 - 145 mmol/L 135 139 141  Potassium 3.5 - 5.1 mmol/L 4.6 3.0(L) 3.9  Chloride 98 - 111 mmol/L 104 111 116(H)  CO2 22 - 32 mmol/L 20(L) 21(L) 19(L)  Calcium 8.9 - 10.3 mg/dL 8.8(L) 7.5(L) 7.6(L)  Total Protein 6.5 - 8.1 g/dL - - -  Total Bilirubin 0.3 - 1.2 mg/dL - - -  Alkaline Phos 38 - 126 U/L - - -  AST 15 - 41 U/L - - -  ALT 0 - 44 U/L - - -   Studies/Results: DG Chest 2 View  Result Date: 08/27/2019 CLINICAL DATA:  Chest and abdominal pain EXAM: CHEST - 2 VIEW COMPARISON:  Chest radiograph dated 08/10/2018 FINDINGS: The heart size and mediastinal contours are within normal limits. Minimal atelectasis in the lingula appears decreased from prior exam. The right lung is clear. There is no pleural effusion or pneumothorax. The visualized skeletal structures are unremarkable. IMPRESSION: No active cardiopulmonary disease. Electronically Signed   By: Zerita Boers M.D.   On: 08/27/2019 08:37   CT ABDOMEN PELVIS W CONTRAST  Addendum Date:  08/27/2019   ADDENDUM REPORT: 08/27/2019 10:14 ADDENDUM: These results were called by telephone on 08/27/2019 at 10:10 am to provider [facility] PA, who verbally acknowledged these results. Electronically Signed   By: Zerita Boers M.D.   On: 08/27/2019 10:14   Result Date: 08/27/2019 CLINICAL DATA:  Patient has abdominal pain. Concern for abdominal abscess. EXAM: CT ABDOMEN AND PELVIS WITH CONTRAST TECHNIQUE: Multidetector CT imaging of the abdomen and pelvis was performed using the standard protocol following bolus administration of intravenous contrast. CONTRAST:  165m OMNIPAQUE IOHEXOL 300 MG/ML  SOLN COMPARISON:  Same day abdominal radiograph and CT abdomen pelvis dated 08/10/2019. FINDINGS: Lower chest: Scarring/atelectasis is seen in the right lower lobe posteriorly. Atherosclerotic calcifications are seen in the coronary arteries. Hepatobiliary: A 5 mm hypoattenuating lesion in the hepatic dome likely represents a small cyst. No gallstones, gallbladder wall thickening, or biliary dilatation. Pancreas: In 8 mm cystic lesion is seen in the tail of the pancreas (series 7, image 146).  Spleen: Normal in size without focal abnormality. Adrenals/Urinary Tract: Adrenal glands are unremarkable. Other than a 1 cm left renal cyst, the kidneys are normal, without renal calculi, focal lesion, or hydronephrosis. Bladder is unremarkable. Stomach/Bowel: There is bowel wall thickening and surrounding fat stranding involving the sigmoid colon associated with diverticuli in this area. This likely represents sigmoid diverticulitis given the lack of a mass lesion seen on recent colonoscopy. There is bowel distension with air-fluid levels involving the proximal large and small bowel, consistent with bowel obstruction. The degree of obstruction is increased since 08/10/2019. A small amount of stool and gas is seen in the rectum. The appendix appears normal. No evidence of abdominal abscess. A loop of sigmoid bowel abuts the  dome of the bladder (series 7, image 112), however no definite evidence of fistula formation is identified. Vascular/Lymphatic: Aortic atherosclerosis. No enlarged abdominal or pelvic lymph nodes. Reproductive: Prostate is unremarkable. Other: No abdominal wall hernia or abnormality. No abdominopelvic ascites. Musculoskeletal: No acute or significant osseous findings. IMPRESSION: 1. Findings most consistent with sigmoid diverticulitis resulting in obstruction of the proximal large and small bowel. The degree of obstruction has increased since 08/10/2019. Bowel wall thickening and surrounding fat stranding involving the sigmoid colon is not specific for sigmoid diverticulitis and may also represent malignancy given that biopsy results from recent colonoscopy did not demonstrate malignancy or active inflammation. No evidence of abdominal abscess. 2. Cystic lesion in the tail of the pancreas is indeterminate. Recommend follow up pre and post contrast MRI/MRCP or pancreatic protocol CT in 2 years. This recommendation follows ACR consensus guidelines: Management of Incidental Pancreatic Cysts: A White Paper of the ACR Incidental Findings Committee. [place] [id number]. Aortic Atherosclerosis (ICD10-I70.0). Electronically Signed: By: Zerita Boers M.D. On: 08/27/2019 10:08   DG Abd 2 Views  Result Date: 08/27/2019 CLINICAL DATA:  Abdominal pain EXAM: ABDOMEN - 2 VIEW COMPARISON:  CT abdomen pelvis dated 08/10/2019 FINDINGS: Multiple dilated loops of small and large bowel are noted with multiple air-fluid levels, concerning for large bowel obstruction. No free intraperitoneal air is identified. No radiopaque foreign body is identified. Degenerative changes are seen in the spine. IMPRESSION: Findings concerning for large bowel obstruction. These results will be called to the ordering clinician or representative by the Radiologist Assistant, and communication documented in the PACS or [employer].  Electronically Signed   By: Zerita Boers M.D.   On: 08/27/2019 08:41   DG Abd Portable 1V-Small Bowel Protocol-Position Verification  Result Date: 08/27/2019 CLINICAL DATA:  NG tube placed EXAM: PORTABLE ABDOMEN - 1 VIEW COMPARISON:  08/27/2019 FINDINGS: Nasogastric tube with the tip projecting over the fundus of the stomach. Gaseous distension of the small bowel and colon partially visualized. No evidence of pneumoperitoneum, portal venous gas or pneumatosis. No pathologic calcifications along the expected course of the ureters. No acute osseous abnormality. IMPRESSION: Nasogastric tube with the tip projecting over the fundus of the stomach. Electronically Signed   By: Kathreen Devoid   On: 08/27/2019 13:14    Principal Problem:   SBO (small bowel obstruction) ([place]) Active Problems:   Depression, major, recurrent ([place])   Essential hypertension   Coronary artery disease involving native coronary artery of native heart without angina pectoris   Diverticulitis    Tye Savoy, NP-C @  08/27/2019, 4:32 PM

## 2019-08-27 NOTE — Consult Note (Addendum)
[facility] Consult Note  Roy [name] [birth year]  [id number].    Requesting MD: Ron Parker Chief Complaint/Reason for Consult: large bowel obstruction  HPI:  Patient is a  is an 74 y.o. male with hx of HTN, HLD, ?DM, CAD (s/p stent x2, on plavix) who was seen in the ED by [facility] 7/10 for possible obstructing sigmoid mass. He underwent colonoscopy 7/13 that showed congested mucosa with no mass, pathology was benign. Patient was discharged from the hospital 07/28/19. He was readmitted to the hospital from 08/10/19-08/15/19 with diverticulitis vs colitis and found to have [place] colitis which was treated with a course of doxycycline. He returns today to [facility] with persistent severe abdominal pain which he reports as similar to previously. He has had some scant diarrhea but is not able to take in much PO. Has been losing weight due to lack of intake. Denies fever, chills, chest pain, SOB.    He denied any known family history of colon, rectal, breast, or ovarian cancer.  He reported previously (07/23/19) that he has a sister whom had her entire colon removed due to a condition that he reports was caused by chronic opioid use  He does not smoke.  He reports social EtOH use.  He denies illicit drug use. 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 Negative for chills and fever.  Respiratory: Negative for shortness of breath and wheezing.   Cardiovascular: Negative for chest pain and palpitations.  Gastrointestinal: Positive for abdominal pain, diarrhea and nausea. Negative for vomiting.  All other systems reviewed and are negative.   Family History  Problem Relation Age of Onset  . Cirrhosis Mother   . Alcohol abuse Mother   . Heart disease Father   . Hypertension Father   . Peripheral vascular disease Father    . Depression Sister   . Heart attack Neg Hx   . Stroke Neg Hx   . Stomach cancer Neg Hx   . Pancreatic cancer Neg Hx   . Colon cancer Neg Hx   . Esophageal cancer Neg Hx     Past Medical History:  Diagnosis Date  . Basal cell carcinoma    Bilateral Legs  . CAD (coronary artery disease)    a.  stent to the RCA in 2001;  b.  Negative Myoview in January 2012;  c.  LHC 4/16:  dLM 20, small D1 50-70, D2 sub-total, oLCx 50-70, RCA stent ok, dPLB 90, EF 35-40% >> DES to D2  . Depression   . History of echocardiogram    a.  Echo 4/16: Mild LVH, EF 31-54%, grade 1 diastolic dysfunction, normal wall motion, MAC  . HLD (hyperlipidemia)   . HTN (hypertension)   . OSA (obstructive sleep apnea)   . Vertigo     Past Surgical History:  Procedure Laterality Date  . BIOPSY  07/26/2019   Procedure: BIOPSY;  Surgeon: Gatha Mayer, MD;  Location: [place];  Service: Endoscopy;;  . CATARACT EXTRACTION, BILATERAL    . COLONOSCOPY N/A 07/26/2019   Procedure: COLONOSCOPY;  Surgeon: Gatha Mayer, MD;  Location: [facility] ENDOSCOPY;  Service: Endoscopy;  Laterality: N/A;  . COLONOSCOPY    . CORONARY STENT PLACEMENT  2001    RCA  . LEFT HEART CATHETERIZATION WITH CORONARY ANGIOGRAM N/A 02/13/2012   Procedure: LEFT HEART  CATHETERIZATION WITH CORONARY ANGIOGRAM;  Surgeon: [name], MD;  Location: [facility] CATH LAB;  Service: Cardiovascular;  Laterality: N/A;  . LEFT HEART CATHETERIZATION WITH CORONARY ANGIOGRAM N/A 04/24/2014   Procedure: LEFT HEART CATHETERIZATION WITH CORONARY ANGIOGRAM;  Surgeon: [name], MD;  Location: [facility] CATH LAB;  Service: Cardiovascular;  Laterality: N/A;  . TONSILLECTOMY      Social History:  reports that he quit smoking about 19 years ago. His smoking use included cigarettes. He has a 25.00 pack-year smoking history. He has never used smokeless tobacco. He reports current alcohol use of about 3.0 standard drinks of alcohol per week. He reports that he does not use  drugs.  Allergies: No Known Allergies  (Not in a hospital admission)   Blood pressure 128/87, pulse 88, temperature 98.5 F (36.9 C), temperature source Oral, resp. rate 17, height _0  (1.676 m), weight 68 kg, SpO2 97 %. Physical Exam:  General: pleasant, WD, WN white male who is laying in bed in NAD HEENT: head is normocephalic, atraumatic.  Sclera are noninjected.  PERRL.  Ears and nose without any masses or lesions.  Mouth is pink and moist Heart: regular, rate, and rhythm.  Normal s1,s2. No obvious murmurs, gallops, or rubs noted.  Palpable radial and pedal pulses bilaterally Lungs: CTAB, no wheezes, rhonchi, or rales noted.  Respiratory effort nonlabored Abd: soft centrally, NT, distended, BS hypoactive MS: all 4 extremities are symmetrical with no cyanosis, clubbing, or edema. Skin: warm and dry with no masses, lesions, or rashes Neuro: Cranial nerves 2-12 grossly intact, sensation grossly intact  Psych: A&Ox3 with an appropriate affect.   Results for orders placed or performed during the hospital encounter of 08/26/19 (from the past 48 hour(s))  Basic metabolic panel     Status: Abnormal   Collection Time: 08/26/19  6:04 PM  Result Value Ref Range   Sodium 135 135 - 145 mmol/L   Potassium 4.6 3.5 - 5.1 mmol/L   Chloride 104 98 - 111 mmol/L   CO2 20 (L) 22 - 32 mmol/L   Glucose, Bld 129 (H) 70 - 99 mg/dL    Comment: Glucose reference range applies only to samples taken after fasting for at least 8 hours.   BUN 11 8 - 23 mg/dL   Creatinine, Ser 1.05 0.61 - 1.24 mg/dL   Calcium 8.8 (L) 8.9 - 10.3 mg/dL   GFR calc non Af Amer >60 >60 mL/min   GFR calc Af Amer >60 >60 mL/min   Anion gap 11 5 - 15    Comment: Performed at [facility] [address], [place], [place] [address]  CBC     Status: Abnormal   Collection Time: 08/26/19  6:04 PM  Result Value Ref Range   WBC 7.2 4.0 - 10.5 K/uL   RBC 4.83 4.22 - 5.81 MIL/uL   Hemoglobin 14.3 13.0 - 17.0 g/dL   HCT 42.8  39 - 52 %   MCV 88.6 80.0 - 100.0 fL   MCH 29.6 26.0 - 34.0 pg   MCHC 33.4 30.0 - 36.0 g/dL   RDW 14.5 11.5 - 15.5 %   Platelets 471 (H) 150 - 400 K/uL   nRBC 0.0 0.0 - 0.2 %    Comment: Performed at [facility], [place] [address], [place], [place] [address]  Troponin I (High Sensitivity)     Status: None   Collection Time: 08/26/19  6:04 PM  Result Value Ref Range   Troponin  I (High Sensitivity) 4 <18 ng/L    Comment: (NOTE) Elevated high sensitivity troponin I (hsTnI) values and significant  changes across serial measurements may suggest ACS but many other  chronic and acute conditions are known to elevate hsTnI results.  Refer to the "Links" section for chest pain algorithms and additional  guidance. Performed at [facility], [place] [address], [place], [place] [address]   CBG monitoring, ED     Status: Abnormal   Collection Time: 08/26/19  6:14 PM  Result Value Ref Range   Glucose-Capillary 123 (H) 70 - 99 mg/dL    Comment: Glucose reference range applies only to samples taken after fasting for at least 8 hours.  Troponin I (High Sensitivity)     Status: None   Collection Time: 08/26/19  8:28 PM  Result Value Ref Range   Troponin I (High Sensitivity) 5 <18 ng/L    Comment: (NOTE) Elevated high sensitivity troponin I (hsTnI) values and significant  changes across serial measurements may suggest ACS but many other  chronic and acute conditions are known to elevate hsTnI results.  Refer to the "Links" section for chest pain algorithms and additional  guidance. Performed at [facility], [place] [address], [place], [place] [address]   Urinalysis, Routine w reflex microscopic Urine, Clean Catch     Status: Abnormal   Collection Time: 08/27/19  6:50 AM  Result Value Ref Range   Color, Urine AMBER (A) YELLOW    Comment: BIOCHEMICALS MAY BE AFFECTED BY COLOR   APPearance HAZY (A) CLEAR   Specific Gravity, Urine 1.028 1.005 - 1.030   pH 5.0 5.0 - 8.0   Glucose, UA  NEGATIVE NEGATIVE mg/dL   Hgb urine dipstick NEGATIVE NEGATIVE   Bilirubin Urine SMALL (A) NEGATIVE   Ketones, ur NEGATIVE NEGATIVE mg/dL   Protein, ur NEGATIVE NEGATIVE mg/dL   Nitrite NEGATIVE NEGATIVE   Leukocytes,Ua NEGATIVE NEGATIVE    Comment: Performed at [facility] [address], [place], [place] [address]  Lactic acid, plasma     Status: Abnormal   Collection Time: 08/27/19  8:53 AM  Result Value Ref Range   Lactic Acid, Venous 2.0 (HH) 0.5 - 1.9 mmol/L    Comment: CRITICAL RESULT CALLED TO, READ BACK BY AND VERIFIED WITH: A.OAKLEY RN @ [phone] 08/27/2019 BY C.EDENS Performed at [facility], [place] [address] [place], [place] [address]    DG Chest 2 View  Result Date: 08/27/2019 CLINICAL DATA:  Chest and abdominal pain EXAM: CHEST - 2 VIEW COMPARISON:  Chest radiograph dated 08/10/2018 FINDINGS: The heart size and mediastinal contours are within normal limits. Minimal atelectasis in the lingula appears decreased from prior exam. The right lung is clear. There is no pleural effusion or pneumothorax. The visualized skeletal structures are unremarkable. IMPRESSION: No active cardiopulmonary disease. Electronically Signed   By: Zerita Boers M.D.   On: 08/27/2019 08:37   CT ABDOMEN PELVIS W CONTRAST  Addendum Date: 08/27/2019   ADDENDUM REPORT: 08/27/2019 10:14 ADDENDUM: These results were called by telephone on 08/27/2019 at 10:10 am to provider [facility] PA, who verbally acknowledged these results. Electronically Signed   By: Zerita Boers M.D.   On: 08/27/2019 10:14   Result Date: 08/27/2019 CLINICAL DATA:  Patient has abdominal pain. Concern for abdominal abscess. EXAM: CT ABDOMEN AND PELVIS WITH CONTRAST TECHNIQUE: Multidetector CT imaging of the abdomen and pelvis was performed using the standard protocol following bolus administration of intravenous contrast.  CONTRAST:  176m OMNIPAQUE IOHEXOL 300 MG/ML  SOLN COMPARISON:  Same day abdominal radiograph and CT abdomen  pelvis dated 08/10/2019. FINDINGS: Lower chest: Scarring/atelectasis is seen in the right lower lobe posteriorly. Atherosclerotic calcifications are seen in the coronary arteries. Hepatobiliary: A 5 mm hypoattenuating lesion in the hepatic dome likely represents a small cyst. No gallstones, gallbladder wall thickening, or biliary dilatation. Pancreas: In 8 mm cystic lesion is seen in the tail of the pancreas (series 7, image 146). Spleen: Normal in size without focal abnormality. Adrenals/Urinary Tract: Adrenal glands are unremarkable. Other than a 1 cm left renal cyst, the kidneys are normal, without renal calculi, focal lesion, or hydronephrosis. Bladder is unremarkable. Stomach/Bowel: There is bowel wall thickening and surrounding fat stranding involving the sigmoid colon associated with diverticuli in this area. This likely represents sigmoid diverticulitis given the lack of a mass lesion seen on recent colonoscopy. There is bowel distension with air-fluid levels involving the proximal large and small bowel, consistent with bowel obstruction. The degree of obstruction is increased since 08/10/2019. A small amount of stool and gas is seen in the rectum. The appendix appears normal. No evidence of abdominal abscess. A loop of sigmoid bowel abuts the dome of the bladder (series 7, image 112), however no definite evidence of fistula formation is identified. Vascular/Lymphatic: Aortic atherosclerosis. No enlarged abdominal or pelvic lymph nodes. Reproductive: Prostate is unremarkable. Other: No abdominal wall hernia or abnormality. No abdominopelvic ascites. Musculoskeletal: No acute or significant osseous findings. IMPRESSION: 1. Findings most consistent with sigmoid diverticulitis resulting in obstruction of the proximal large and small bowel. The degree of obstruction has increased since 08/10/2019. Bowel wall thickening and surrounding fat stranding involving the sigmoid colon is not specific for sigmoid  diverticulitis and may also represent malignancy given that biopsy results from recent colonoscopy did not demonstrate malignancy or active inflammation. No evidence of abdominal abscess. 2. Cystic lesion in the tail of the pancreas is indeterminate. Recommend follow up pre and post contrast MRI/MRCP or pancreatic protocol CT in 2 years. This recommendation follows ACR consensus guidelines: Management of Incidental Pancreatic Cysts: A White Paper of the ACR Incidental Findings Committee. [employer] [id number] Aortic Atherosclerosis (ICD10-I70.0). Electronically Signed: By: TZerita BoersM.D. On: 08/27/2019 10:08   DG Abd 2 Views  Result Date: 08/27/2019 CLINICAL DATA:  Abdominal pain EXAM: ABDOMEN - 2 VIEW COMPARISON:  CT abdomen pelvis dated 08/10/2019 FINDINGS: Multiple dilated loops of small and large bowel are noted with multiple air-fluid levels, concerning for large bowel obstruction. No free intraperitoneal air is identified. No radiopaque foreign body is identified. Degenerative changes are seen in the spine. IMPRESSION: Findings concerning for large bowel obstruction. These results will be called to the ordering clinician or representative by the Radiologist Assistant, and communication documented in the PACS or [employer] Electronically Signed   By: TZerita BoersM.D.   On: 08/27/2019 08:41      Assessment/Plan HTN HLD CADs/p PCI 2001 and 2016- hold plavix.  ECHO7/12 shows EF 70-75%. Cardiology reports patient is moderate cardiovascular risk DM - A1c 6.2 OSA AKI -Cr1.05, IVF  LBO - mass vs colitis - lactic slightly elevated at 2, no leukocytosis, no fever  - patient returns with similar symptoms noted previously - NGT for decompression and NPO - check prealbumin  - GI consulted, ?may need repeat colonoscopy, await further recommendations - no indication for emergent surgical intervention at this time  Admit to medicine service. We will follow as well, GI  to consult also   Norm Parcel, [facility] 08/27/2019, 11:59 AM Please see Amion for pager number during day hours 7:00am-4:30pm

## 2019-08-27 NOTE — Progress Notes (Signed)
Notified Roy [name] that  pt states code status is DNR, has a living will,has [place], and is a organ donor. His chart states he is a full code. RN will continue to monitor.

## 2019-08-27 NOTE — ED Provider Notes (Signed)
[facility] EMERGENCY DEPARTMENT Provider Note   CSN: [id number] Arrival date & time: 08/26/19  1710     History Chief Complaint  Patient presents with  . Chest Pain  . Weakness    Roy [name] is a 74 y.o. male.  HPI  Patient recently discharged from hospital 08/15/2019 after 5 days of hospitalization for colitis.  GI pathogen panel was found to be positive for Yersinia and he was treated with doxycycline.  Patient states that he has had persistent severe intermittent abdominal pain since that time.  He came to ED today because of his severe symptoms.  He has continued to have some scant diarrhea but states he has not been able to eat much and has been losing weight as result of no appetite.  He states no pain when he eats but just that he has no appetite.  Patient describes the pain as achy, severe, sharp and stabbing and intermittent. He denies any aggravating mitigating factors.  He has taken no medications prior to arrival.  He denies any other associated symptoms besides those detailed below.     Past Medical History:  Diagnosis Date  . Basal cell carcinoma    Bilateral Legs  . CAD (coronary artery disease)    a.  stent to the RCA in 2001;  b.  Negative Myoview in January 2012;  c.  LHC 4/16:  dLM 20, small D1 50-70, D2 sub-total, oLCx 50-70, RCA stent ok, dPLB 90, EF 35-40% >> DES to D2  . Depression   . History of echocardiogram    a.  Echo 4/16: Mild LVH, EF 17-61%, grade 1 diastolic dysfunction, normal wall motion, MAC  . HLD (hyperlipidemia)   . HTN (hypertension)   . OSA (obstructive sleep apnea)   . Vertigo     Patient Active Problem List   Diagnosis Date Noted  . SBO (small bowel obstruction) ([place]) 08/27/2019  . Diverticulitis 08/27/2019  . 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 Lactic acid acidosis 08/10/2019  . Hypokalemia 08/10/2019  . Colitis, acute 07/29/2019  . Abnormal CT scan, colon   .  Protein-calorie malnutrition, severe 07/25/2019  . Large bowel obstruction ([place]) 07/23/2019  . Acute renal failure ([place]) 07/23/2019  . Controlled type 2 diabetes mellitus with hyperglycemia ([place]) 07/23/2019  . Insomnia 07/23/2019  . Hypotension 07/01/2019  . Personal history of multiple sclerosis ([place]) 12/02/2018  . Chronic fatigue 03/31/2017  . Acute constipation 05/25/2015  . Internal hemorrhoids with complication 60/73/7106  . CAD, RCA PCI 2001, urgent Dx2 DES 04/24/14 04/24/2014  . Unstable angina with ST elelvation and NSVT while on treadmill 04/24/14   . History of basal cell carcinoma 03/07/2014  . GERD (gastroesophageal reflux disease) 03/07/2014  . Incomplete emptying of bladder 03/07/2014  . Obstructive sleep apnea 08/20/2009  . Dyslipidemia 11/09/2008  . Depression, major, recurrent ([place]) 07/06/2007  . Essential hypertension 07/06/2007  . Coronary artery disease involving native coronary artery of native heart without angina pectoris 07/06/2007    Past Surgical History:  Procedure Laterality Date  . BIOPSY  07/26/2019   Procedure: BIOPSY;  Surgeon: Gatha Mayer, MD;  Location: [facility];  Service: Endoscopy;;  . CATARACT EXTRACTION, BILATERAL    . COLONOSCOPY N/A 07/26/2019   Procedure: COLONOSCOPY;  Surgeon: Gatha Mayer, MD;  Location: [facility] ENDOSCOPY;  Service: Endoscopy;  Laterality: N/A;  . COLONOSCOPY    . CORONARY STENT PLACEMENT  2001    RCA  . LEFT HEART  CATHETERIZATION WITH CORONARY ANGIOGRAM N/A 02/13/2012   Procedure: LEFT HEART CATHETERIZATION WITH CORONARY ANGIOGRAM;  Surgeon: [name], MD;  Location: [facility] CATH LAB;  Service: Cardiovascular;  Laterality: N/A;  . LEFT HEART CATHETERIZATION WITH CORONARY ANGIOGRAM N/A 04/24/2014   Procedure: LEFT HEART CATHETERIZATION WITH CORONARY ANGIOGRAM;  Surgeon: [name], MD;  Location: [facility] CATH LAB;  Service: Cardiovascular;  Laterality: N/A;  . TONSILLECTOMY         Family History  Problem Relation  Age of Onset  . Cirrhosis Mother   . Alcohol abuse Mother   . Heart disease Father   . Hypertension Father   . Peripheral vascular disease Father   . Depression Sister   . Heart attack Neg Hx   . Stroke Neg Hx   . Stomach cancer Neg Hx   . Pancreatic cancer Neg Hx   . Colon cancer Neg Hx   . Esophageal cancer Neg Hx     Social History   Tobacco Use  . Smoking status: Former Smoker    Packs/day: 1.00    Years: 25.00    Pack years: 25.00    Types: Cigarettes    Quit date: 01/14/2000    Years since quitting: 19.6  . Smokeless tobacco: Never Used  Vaping Use  . Vaping Use: Never used  Substance Use Topics  . Alcohol use: Yes    Alcohol/week: 3.0 standard drinks    Types: 1 Cans of beer, 2 Shots of liquor per week  . Drug use: No    Home Medications Prior to Admission medications   Medication Sig Start Date End Date Taking? Authorizing Provider  carvedilol (COREG) 12.5 MG tablet TAKE 1 TABLET BY MOUTH 2 TIMES DAILY. Patient taking differently: Take 12.5 mg by mouth 2 (two) times daily with a meal.  08/22/19  Yes Josue Hector, MD  clopidogrel (PLAVIX) 75 MG tablet TAKE 1 TABLET BY MOUTH EVERY DAY Patient taking differently: Take 75 mg by mouth daily.  10/12/18  Yes Josue Hector, MD  dicyclomine (BENTYL) 20 MG tablet Take 1 tablet (20 mg total) by mouth every 6 (six) hours as needed (abdominal pain). 07/28/19  Yes Aline August, MD  loperamide (IMODIUM) 2 MG capsule Take 1 capsule (2 mg total) by mouth every 6 (six) hours as needed for diarrhea or loose stools. 08/15/19  Yes Shelly Coss, MD  Magnesium Oxide 400 MG CAPS Take 1 capsule (400 mg total) by mouth daily. 08/15/19  Yes Shelly Coss, MD  mesalamine (LIALDA) 1.2 g EC tablet Take 4 tablets (4.8 g total) by mouth daily with breakfast. 08/16/19  Yes Adhikari, Tamsen Meek, MD  nitroGLYCERIN (NITROSTAT) 0.4 MG SL tablet Place 1 tablet (0.4 mg total) under the tongue every 5 (five) minutes x 3 doses as needed for chest pain.  07/28/19  Yes Aline August, MD  omeprazole (PRILOSEC) 20 MG capsule TAKE 1 CAPSULE BY MOUTH EVERY DAY Patient taking differently: Take 20 mg by mouth daily.  08/22/19  Yes Josue Hector, MD  ondansetron (ZOFRAN) 4 MG tablet Take 1 tablet (4 mg total) by mouth every 8 (eight) hours as needed for nausea or vomiting. 07/01/19  Yes [name], [name], MD  potassium chloride 20 MEQ TBCR Take 20 mEq by mouth daily. Take 2 pills (40 mEq) for a week then continue taking 20 mEq daily.Check potassium  level in a week Patient taking differently: Take 20 mEq by mouth daily. 20 mEq, Oral, Daily, Take 2 pills (40 mEq) for  a week then continue taking 20 mEq daily.Check potassium  level in a week 08/15/19  Yes Adhikari, Tamsen Meek, MD  venlafaxine XR (EFFEXOR-XR) 37.5 MG 24 hr capsule Take in addition to 75 mg tablets Patient taking differently: Take 37.5 mg by mouth See admin instructions. Take in addition to 75 mg tablets 08/23/19  Yes [name], [name], MD  venlafaxine XR (EFFEXOR-XR) 75 MG 24 hr capsule TAKE 1 CAPSULE (75 MG TOTAL) BY MOUTH DAILY WITH BREAKFAST. 06/20/19  Yes [name], [name], MD    Allergies    Patient has no known allergies.  Review of Systems   Review of Systems  Constitutional: Positive for appetite change. Negative for chills and fever.  HENT: Negative for congestion.   Eyes: Negative for pain.  Respiratory: Negative for cough and shortness of breath.   Cardiovascular: Negative for chest pain and leg swelling.  Gastrointestinal: Positive for abdominal distention, abdominal pain, diarrhea, nausea and vomiting.  Genitourinary: Negative for dysuria.  Musculoskeletal: Negative for myalgias.  Skin: Negative for rash.  Neurological: Negative for dizziness and headaches.    Physical Exam Updated Vital Signs BP 122/88   Pulse 89   Temp 98.5 F (36.9 C) (Oral)   Resp 16   Ht _0  (1.676 m)   Wt 68 kg   SpO2 95%   BMI 24.21 kg/m   Physical Exam Vitals and nursing note reviewed.   Constitutional:      General: He is not in acute distress.    Comments: Patient appears acutely uncomfortable but in no acute distress.  Is not diaphoretic sweaty or ill-appearing.  HENT:     Head: Normocephalic and atraumatic.     Nose: Nose normal.  Eyes:     General: No scleral icterus. Cardiovascular:     Rate and Rhythm: Normal rate and regular rhythm.     Pulses: Normal pulses.     Heart sounds: Normal heart sounds.  Pulmonary:     Effort: Pulmonary effort is normal. No respiratory distress.     Breath sounds: No wheezing.  Abdominal:     General: There is distension.     Palpations: Abdomen is soft.     Tenderness: There is abdominal tenderness. There is no guarding or rebound.     Comments: Patient with hyperactive bowel sounds with high resonance with tympany with percussion. No guarding or rebound but there is diffuse tenderness.  More significantly in the left lower quadrant.  No bruising.  No palpable abdominal masses.  No CVA tenderness.  No flank bruising.  Musculoskeletal:     Cervical back: Normal range of motion.     Right lower leg: No edema.     Left lower leg: No edema.  Skin:    General: Skin is warm and dry.     Capillary Refill: Capillary refill takes less than 2 seconds.  Neurological:     Mental Status: He is alert. Mental status is at baseline.  Psychiatric:        Mood and Affect: Mood normal.        Behavior: Behavior normal.     ED Results / Procedures / Treatments   Labs (all labs ordered are listed, but only abnormal results are displayed) Labs Reviewed  BASIC METABOLIC PANEL - Abnormal; Notable for the following components:      Result Value   CO2 20 (*)    Glucose, Bld 129 (*)    Calcium 8.8 (*)    All other components within normal  limits  CBC - Abnormal; Notable for the following components:   Platelets 471 (*)    All other components within normal limits  URINALYSIS, ROUTINE W REFLEX MICROSCOPIC - Abnormal; Notable for the  following components:   Color, Urine AMBER (*)    APPearance HAZY (*)    Bilirubin Urine SMALL (*)    All other components within normal limits  LACTIC ACID, PLASMA - Abnormal; Notable for the following components:   Lactic Acid, Venous 2.0 (*)    All other components within normal limits  CBG MONITORING, ED - Abnormal; Notable for the following components:   Glucose-Capillary 123 (*)    All other components within normal limits  CBG MONITORING, ED - Abnormal; Notable for the following components:   Glucose-Capillary 126 (*)    All other components within normal limits  SARS CORONAVIRUS 2 BY RT PCR (HOSPITAL ORDER, [facility])  TROPONIN I (HIGH SENSITIVITY)  TROPONIN I (HIGH SENSITIVITY)    EKG EKG Interpretation  Date/Time:  Friday August 26 2019 17:58:58 EDT Ventricular Rate:  107 PR Interval:  134 QRS Duration: 82 QT Interval:  332 QTC Calculation: 443 R Axis:   -11 Text Interpretation: Sinus tachycardia Inferior infarct , age undetermined Abnormal ECG Confirmed by Ripley Fraise [phone]) on 08/27/2019 11:42:30 AM   Radiology DG Chest 2 View  Result Date: 08/27/2019 CLINICAL DATA:  Chest and abdominal pain EXAM: CHEST - 2 VIEW COMPARISON:  Chest radiograph dated 08/10/2018 FINDINGS: The heart size and mediastinal contours are within normal limits. Minimal atelectasis in the lingula appears decreased from prior exam. The right lung is clear. There is no pleural effusion or pneumothorax. The visualized skeletal structures are unremarkable. IMPRESSION: No active cardiopulmonary disease. Electronically Signed   By: Zerita Boers M.D.   On: 08/27/2019 08:37   CT ABDOMEN PELVIS W CONTRAST  Addendum Date: 08/27/2019   ADDENDUM REPORT: 08/27/2019 10:14 ADDENDUM: These results were called by telephone on 08/27/2019 at 10:10 am to provider [facility] PA, who verbally acknowledged these results. Electronically Signed   By: Zerita Boers M.D.   On:  08/27/2019 10:14   Result Date: 08/27/2019 CLINICAL DATA:  Patient has abdominal pain. Concern for abdominal abscess. EXAM: CT ABDOMEN AND PELVIS WITH CONTRAST TECHNIQUE: Multidetector CT imaging of the abdomen and pelvis was performed using the standard protocol following bolus administration of intravenous contrast. CONTRAST:  184m OMNIPAQUE IOHEXOL 300 MG/ML  SOLN COMPARISON:  Same day abdominal radiograph and CT abdomen pelvis dated 08/10/2019. FINDINGS: Lower chest: Scarring/atelectasis is seen in the right lower lobe posteriorly. Atherosclerotic calcifications are seen in the coronary arteries. Hepatobiliary: A 5 mm hypoattenuating lesion in the hepatic dome likely represents a small cyst. No gallstones, gallbladder wall thickening, or biliary dilatation. Pancreas: In 8 mm cystic lesion is seen in the tail of the pancreas (series 7, image 146). Spleen: Normal in size without focal abnormality. Adrenals/Urinary Tract: Adrenal glands are unremarkable. Other than a 1 cm left renal cyst, the kidneys are normal, without renal calculi, focal lesion, or hydronephrosis. Bladder is unremarkable. Stomach/Bowel: There is bowel wall thickening and surrounding fat stranding involving the sigmoid colon associated with diverticuli in this area. This likely represents sigmoid diverticulitis given the lack of a mass lesion seen on recent colonoscopy. There is bowel distension with air-fluid levels involving the proximal large and small bowel, consistent with bowel obstruction. The degree of obstruction is increased since 08/10/2019. A small amount of stool and gas is seen in  the rectum. The appendix appears normal. No evidence of abdominal abscess. A loop of sigmoid bowel abuts the dome of the bladder (series 7, image 112), however no definite evidence of fistula formation is identified. Vascular/Lymphatic: Aortic atherosclerosis. No enlarged abdominal or pelvic lymph nodes. Reproductive: Prostate is unremarkable. Other: No  abdominal wall hernia or abnormality. No abdominopelvic ascites. Musculoskeletal: No acute or significant osseous findings. IMPRESSION: 1. Findings most consistent with sigmoid diverticulitis resulting in obstruction of the proximal large and small bowel. The degree of obstruction has increased since 08/10/2019. Bowel wall thickening and surrounding fat stranding involving the sigmoid colon is not specific for sigmoid diverticulitis and may also represent malignancy given that biopsy results from recent colonoscopy did not demonstrate malignancy or active inflammation. No evidence of abdominal abscess. 2. Cystic lesion in the tail of the pancreas is indeterminate. Recommend follow up pre and post contrast MRI/MRCP or pancreatic protocol CT in 2 years. This recommendation follows ACR consensus guidelines: Management of Incidental Pancreatic Cysts: A White Paper of the ACR Incidental Findings Committee. [place] 1941;74:081-448. Aortic Atherosclerosis (ICD10-I70.0). Electronically Signed: By: Zerita Boers M.D. On: 08/27/2019 10:08   DG Abd 2 Views  Result Date: 08/27/2019 CLINICAL DATA:  Abdominal pain EXAM: ABDOMEN - 2 VIEW COMPARISON:  CT abdomen pelvis dated 08/10/2019 FINDINGS: Multiple dilated loops of small and large bowel are noted with multiple air-fluid levels, concerning for large bowel obstruction. No free intraperitoneal air is identified. No radiopaque foreign body is identified. Degenerative changes are seen in the spine. IMPRESSION: Findings concerning for large bowel obstruction. These results will be called to the ordering clinician or representative by the Radiologist Assistant, and communication documented in the PACS or [employer]. Electronically Signed   By: Zerita Boers M.D.   On: 08/27/2019 08:41   DG Abd Portable 1V-Small Bowel Protocol-Position Verification  Result Date: 08/27/2019 CLINICAL DATA:  NG tube placed EXAM: PORTABLE ABDOMEN - 1 VIEW COMPARISON:  08/27/2019  FINDINGS: Nasogastric tube with the tip projecting over the fundus of the stomach. Gaseous distension of the small bowel and colon partially visualized. No evidence of pneumoperitoneum, portal venous gas or pneumatosis. No pathologic calcifications along the expected course of the ureters. No acute osseous abnormality. IMPRESSION: Nasogastric tube with the tip projecting over the fundus of the stomach. Electronically Signed   By: Kathreen Devoid   On: 08/27/2019 13:14    Procedures Procedures (including critical care time)  Medications Ordered in ED Medications  diatrizoate meglumine-sodium (GASTROGRAFIN) 66-10 % solution 90 mL (has no administration in time range)  carvedilol (COREG) tablet 12.5 mg (has no administration in time range)  nitroGLYCERIN (NITROSTAT) SL tablet 0.4 mg (has no administration in time range)  sodium chloride flush (NS) 0.9 % injection 3 mL (has no administration in time range)  0.9 %  sodium chloride infusion (has no administration in time range)  morphine 2 MG/ML injection 2 mg (has no administration in time range)  ondansetron (ZOFRAN) tablet 4 mg (has no administration in time range)    Or  ondansetron (ZOFRAN) injection 4 mg (has no administration in time range)  pantoprazole (PROTONIX) injection 40 mg (has no administration in time range)  hydrALAZINE (APRESOLINE) injection 10 mg (has no administration in time range)  piperacillin-tazobactam (ZOSYN) IVPB 3.375 g (has no administration in time range)    Followed by  piperacillin-tazobactam (ZOSYN) IVPB 3.375 g (has no administration in time range)  iohexol (OMNIPAQUE) 300 MG/ML solution 100 mL (100 mLs Intravenous Contrast  Given 08/27/19 0908)  fentaNYL (SUBLIMAZE) injection 50 mcg (50 mcg Intravenous Given 08/27/19 1116)  ondansetron (ZOFRAN) injection 4 mg (4 mg Intravenous Given 08/27/19 1116)    ED Course  I have reviewed the triage vital signs and the nursing notes.  Pertinent labs & imaging results that  were available during my care of the patient were reviewed by me and considered in my medical decision making (see chart for details).  Patient is a 74 year old male with past medical history detailed above presented today for severe abdominal pain.  He initially told triage and EMS that he had chest pain however this appears to be his chronic anginal symptoms and he denies any changes in his whatsoever.  He denies any current chest pain and states that he is here because of abdominal pain and distention.  Physical exam is notable for tympany with percussion no significant guarding or rebound but there is distention and generalized tenderness most notably left lower quadrant.  X-ray is notable for a significantly dilated small bowel and questionably extend distended large bowel.  Chest x-ray is unremarkable.  Will obtain CT imaging.  CT imaging results discussed via phone with radiology.  Questionable ileus versus SBO versus LBO.  Will discuss with general surgery.  BMP without any acute significant electrolyte derangement.  CBC without leukocytosis or anemia.  Urine is relatively unremarkable.  Lactic mildly elevated at 2.  This makes the possibility of ischemic colitis somewhat more likely however patient has no significant risk factors and his pain seems to wax and wane which is not elicited by eating worsened by eating therefore not having any abdominal angina.  Troponin next to within normal limits.  EKG without any acute changes.  Covid is negative.  Clinical Course as of Aug 26 1612  Sat Aug 27, 2019  1112 Discussed with Barkley Boards of general surgery -- recommended NG tube and admission to medicine. She will discuss with gen surgery attending and call back with recommendations.   [WF]  1138 Discussed with Dr. Francine Graven of hospitalist service who will admit to medicine.  General surgery will consult.   [WF]    Clinical Course User Index [WF] Tedd Sias, [place]   MDM  Rules/Calculators/A&P                          General surgery will see patient in consultation.  Dr. Francine Graven to admit.  The patient appears reasonably stabilized for admission considering the current resources, flow, and capabilities available in the ED at this time, and I doubt any other [facility] requiring further screening and/or treatment in the ED prior to admission.  Final Clinical Impression(s) / ED Diagnoses Final diagnoses:  Encounter for imaging study to confirm nasogastric (NG) tube placement  Intestinal obstruction, unspecified cause, unspecified whether partial or complete [facility])    Rx / DC Orders ED Discharge Orders    None       Tedd Sias, [place] 08/27/19 1614    Breck Coons, MD 08/28/19 [phone]

## 2019-08-27 NOTE — H&P (Addendum)
History and Physical    Roy [name] HWE:[id number] DOB: 1945-05-03 DOA: 08/26/2019  PCP: Jinny Sanders, MD   Patient coming from: Home  I have personally briefly reviewed patient's old medical records in [place]  Chief Complaint: Abdominal pain  HPI: Roy [name] is a 74 y.o. male with medical history significant for coronary artery disease status post stent angioplasty, hypertension, depression, obstructive sleep apnea who presents to the emergency room for evaluation of abdominal pain mostly periumbilical.  Patient states that he has had symptoms intermittently for the last 1 month and that he was recently admitted to the hospital and at that time he had a colonoscopy and was told he had a bowel obstruction.  His GI pathogen panel was positive for Yersinia and he was treated with doxycycline.  Patient states that since his discharge he has continued to have intermittent periumbilical pain which he rates a 6 x 10 in intensity at its worst.  Pain is nonradiating and is associated with diarrhea but he denies having any nausea vomiting.  He states that his appetite is poor and that he has lost weight.  He has been following up with gastroenterology since his discharge. He denies having any chest pain but has occasional shortness of breath, he denies having any fever or chills, no cough, no dizziness or lightheadedness. Labs reveal a sodium level of 135, potassium of 4.6, bicarb of 20, lactic acid of 2, white count of 7.2, hemoglobin of 14.3. CT scan of abdomen pelvis shows findings most consistent with sigmoid diverticulitis resulting in obstruction of the proximal large and small bowel. The degree of obstruction has increased since 08/10/2019. Bowel wall thickening and surrounding fat stranding involving the sigmoid colon is not specific for sigmoid diverticulitis and may also represent malignancy given that biopsy results from recent colonoscopy did not demonstrate malignancy  or active inflammation. No evidence of abdominal abscess. Cystic lesion in the tail of the pancreas is indeterminate. Twelve-lead EKG reviewed by me shows sinus tachycardia.   ED Course: Patient is a 74 year old Caucasian male who presents to the ER for evaluation of abdominal pain which he has had intermittently for the last 1 month.  Imaging is suggestive of diverticulitis with a bowel obstruction.  Surgical consult has been placed and patient will be admitted to the hospital for further evaluation.  Review of Systems: As per HPI otherwise 10 point review of systems negative.    Past Medical History:  Diagnosis Date  . Basal cell carcinoma    Bilateral Legs  . CAD (coronary artery disease)    a.  stent to the RCA in 2001;  b.  Negative Myoview in January 2012;  c.  LHC 4/16:  dLM 20, small D1 50-70, D2 sub-total, oLCx 50-70, RCA stent ok, dPLB 90, EF 35-40% >> DES to D2  . Depression   . History of echocardiogram    a.  Echo 4/16: Mild LVH, EF 89-38%, grade 1 diastolic dysfunction, normal wall motion, MAC  . HLD (hyperlipidemia)   . HTN (hypertension)   . OSA (obstructive sleep apnea)   . Vertigo     Past Surgical History:  Procedure Laterality Date  . BIOPSY  07/26/2019   Procedure: BIOPSY;  Surgeon: Gatha Mayer, MD;  Location: [facility];  Service: Endoscopy;;  . CATARACT EXTRACTION, BILATERAL    . COLONOSCOPY N/A 07/26/2019   Procedure: COLONOSCOPY;  Surgeon: Gatha Mayer, MD;  Location: [facility] ENDOSCOPY;  Service: Endoscopy;  Laterality: N/A;  .  COLONOSCOPY    . CORONARY STENT PLACEMENT  2001    RCA  . LEFT HEART CATHETERIZATION WITH CORONARY ANGIOGRAM N/A 02/13/2012   Procedure: LEFT HEART CATHETERIZATION WITH CORONARY ANGIOGRAM;  Surgeon: [name], MD;  Location: [facility] CATH LAB;  Service: Cardiovascular;  Laterality: N/A;  . LEFT HEART CATHETERIZATION WITH CORONARY ANGIOGRAM N/A 04/24/2014   Procedure: LEFT HEART CATHETERIZATION WITH CORONARY ANGIOGRAM;  Surgeon:  [name], MD;  Location: [facility] CATH LAB;  Service: Cardiovascular;  Laterality: N/A;  . TONSILLECTOMY       reports that he quit smoking about 19 years ago. His smoking use included cigarettes. He has a 25.00 pack-year smoking history. He has never used smokeless tobacco. He reports current alcohol use of about 3.0 standard drinks of alcohol per week. He reports that he does not use drugs.  No Known Allergies  Family History  Problem Relation Age of Onset  . Cirrhosis Mother   . Alcohol abuse Mother   . Heart disease Father   . Hypertension Father   . Peripheral vascular disease Father   . Depression Sister   . Heart attack Neg Hx   . Stroke Neg Hx   . Stomach cancer Neg Hx   . Pancreatic cancer Neg Hx   . Colon cancer Neg Hx   . Esophageal cancer Neg Hx      Prior to Admission medications   Medication Sig Start Date End Date Taking? Authorizing Provider  carvedilol (COREG) 12.5 MG tablet TAKE 1 TABLET BY MOUTH 2 TIMES DAILY. Patient taking differently: Take 12.5 mg by mouth 2 (two) times daily with a meal.  08/22/19  Yes Josue Hector, MD  clopidogrel (PLAVIX) 75 MG tablet TAKE 1 TABLET BY MOUTH EVERY DAY Patient taking differently: Take 75 mg by mouth daily.  10/12/18  Yes Josue Hector, MD  dicyclomine (BENTYL) 20 MG tablet Take 1 tablet (20 mg total) by mouth every 6 (six) hours as needed (abdominal pain). 07/28/19  Yes Aline August, MD  loperamide (IMODIUM) 2 MG capsule Take 1 capsule (2 mg total) by mouth every 6 (six) hours as needed for diarrhea or loose stools. 08/15/19  Yes Shelly Coss, MD  Magnesium Oxide 400 MG CAPS Take 1 capsule (400 mg total) by mouth daily. 08/15/19  Yes Shelly Coss, MD  mesalamine (LIALDA) 1.2 g EC tablet Take 4 tablets (4.8 g total) by mouth daily with breakfast. 08/16/19  Yes Adhikari, Tamsen Meek, MD  nitroGLYCERIN (NITROSTAT) 0.4 MG SL tablet Place 1 tablet (0.4 mg total) under the tongue every 5 (five) minutes x 3 doses as needed for chest  pain. 07/28/19  Yes Aline August, MD  omeprazole (PRILOSEC) 20 MG capsule TAKE 1 CAPSULE BY MOUTH EVERY DAY Patient taking differently: Take 20 mg by mouth daily.  08/22/19  Yes Josue Hector, MD  ondansetron (ZOFRAN) 4 MG tablet Take 1 tablet (4 mg total) by mouth every 8 (eight) hours as needed for nausea or vomiting. 07/01/19  Yes [name], [name], MD  potassium chloride 20 MEQ TBCR Take 20 mEq by mouth daily. Take 2 pills (40 mEq) for a week then continue taking 20 mEq daily.Check potassium  level in a week Patient taking differently: Take 20 mEq by mouth daily. 20 mEq, Oral, Daily, Take 2 pills (40 mEq) for a week then continue taking 20 mEq daily.Check potassium  level in a week 08/15/19  Yes Adhikari, Tamsen Meek, MD  venlafaxine XR (EFFEXOR-XR) 37.5 MG 24 hr capsule Take  in addition to 75 mg tablets Patient taking differently: Take 37.5 mg by mouth See admin instructions. Take in addition to 75 mg tablets 08/23/19  Yes [name], [name], MD  venlafaxine XR (EFFEXOR-XR) 75 MG 24 hr capsule TAKE 1 CAPSULE (75 MG TOTAL) BY MOUTH DAILY WITH BREAKFAST. 06/20/19  Yes Jinny Sanders, MD    Physical Exam: Vitals:   08/27/19 0719 08/27/19 1100 08/27/19 1130 08/27/19 1200  BP: 111/85 128/87 106/85 (!) 116/91  Pulse: 80 88 95 87  Resp: _0 Temp:      TempSrc:      SpO2: 99% 97% 94% 97%  Weight:      Height:         Vitals:   08/27/19 0719 08/27/19 1100 08/27/19 1130 08/27/19 1200  BP: 111/85 128/87 106/85 (!) 116/91  Pulse: 80 88 95 87  Resp: _1 Temp:      TempSrc:      SpO2: 99% 97% 94% 97%  Weight:      Height:        Constitutional: NAD, alert and oriented x 3. Appears comfortable and in no distress Eyes: PERRL, lids and conjunctivae normal ENMT: Mucous membranes are moist.  Neck: normal, supple, no masses, no thyromegaly Respiratory: clear to auscultation bilaterally, no wheezing, no crackles. Normal respiratory effort. No accessory muscle use.  Cardiovascular: Regular  rate and rhythm, no murmurs / rubs / gallops. No extremity edema. 2+ pedal pulses. No carotid bruits.  Abdomen: tenderness in the periumbilical area, no masses palpated. No hepatosplenomegaly. Bowel sounds positive.  Musculoskeletal: no clubbing / cyanosis. No joint deformity upper and lower extremities.  Skin: no rashes, lesions, ulcers.  Neurologic: No gross focal neurologic deficit. Psychiatric: Normal mood and affect.   Labs on Admission: I have personally reviewed following labs and imaging studies  CBC: Recent Labs  Lab 08/26/19 1804  WBC 7.2  HGB 14.3  HCT 42.8  MCV 88.6  PLT 412*   Basic Metabolic Panel: Recent Labs  Lab 08/26/19 1804  NA 135  K 4.6  CL 104  CO2 20*  GLUCOSE 129*  BUN 11  CREATININE 1.05  CALCIUM 8.8*   GFR: Estimated Creatinine Clearance: 56.5 mL/min (by C-G formula based on SCr of 1.05 mg/dL). Liver Function Tests: No results for input(s): AST, ALT, ALKPHOS, BILITOT, PROT, ALBUMIN in the last 168 hours. No results for input(s): LIPASE, AMYLASE in the last 168 hours. No results for input(s): AMMONIA in the last 168 hours. Coagulation Profile: No results for input(s): INR, PROTIME in the last 168 hours. Cardiac Enzymes: No results for input(s): CKTOTAL, CKMB, CKMBINDEX, TROPONINI in the last 168 hours. BNP (last 3 results) No results for input(s): PROBNP in the last 8760 hours. HbA1C: No results for input(s): HGBA1C in the last 72 hours. CBG: Recent Labs  Lab 08/26/19 1814 08/27/19 1231  GLUCAP 123* 126*   Lipid Profile: No results for input(s): CHOL, HDL, LDLCALC, TRIG, CHOLHDL, LDLDIRECT in the last 72 hours. Thyroid Function Tests: No results for input(s): TSH, T4TOTAL, FREET4, T3FREE, THYROIDAB in the last 72 hours. Anemia Panel: No results for input(s): VITAMINB12, FOLATE, FERRITIN, TIBC, IRON, RETICCTPCT in the last 72 hours. Urine analysis:    Component Value Date/Time   COLORURINE AMBER (A) 08/27/2019 0650   APPEARANCEUR  HAZY (A) 08/27/2019 0650   LABSPEC 1.028 08/27/2019 0650   PHURINE 5.0 08/27/2019 [place] 08/27/2019 [place] 12/08/2005 0741  HGBUR NEGATIVE 08/27/2019 0650   BILIRUBINUR SMALL (A) 08/27/2019 0650   KETONESUR NEGATIVE 08/27/2019 0650   PROTEINUR NEGATIVE 08/27/2019 0650   UROBILINOGEN 0.2 mg/dL 12/08/2005 0741   NITRITE NEGATIVE 08/27/2019 0650   LEUKOCYTESUR NEGATIVE 08/27/2019 0650    Radiological Exams on Admission: DG Chest 2 View  Result Date: 08/27/2019 CLINICAL DATA:  Chest and abdominal pain EXAM: CHEST - 2 VIEW COMPARISON:  Chest radiograph dated 08/10/2018 FINDINGS: The heart size and mediastinal contours are within normal limits. Minimal atelectasis in the lingula appears decreased from prior exam. The right lung is clear. There is no pleural effusion or pneumothorax. The visualized skeletal structures are unremarkable. IMPRESSION: No active cardiopulmonary disease. Electronically Signed   By: Zerita Boers M.D.   On: 08/27/2019 08:37   CT ABDOMEN PELVIS W CONTRAST  Addendum Date: 08/27/2019   ADDENDUM REPORT: 08/27/2019 10:14 ADDENDUM: These results were called by telephone on 08/27/2019 at 10:10 am to provider [facility], who verbally acknowledged these results. Electronically Signed   By: Zerita Boers M.D.   On: 08/27/2019 10:14   Result Date: 08/27/2019 CLINICAL DATA:  Patient has abdominal pain. Concern for abdominal abscess. EXAM: CT ABDOMEN AND PELVIS WITH CONTRAST TECHNIQUE: Multidetector CT imaging of the abdomen and pelvis was performed using the standard protocol following bolus administration of intravenous contrast. CONTRAST:  141m OMNIPAQUE IOHEXOL 300 MG/ML  SOLN COMPARISON:  Same day abdominal radiograph and CT abdomen pelvis dated 08/10/2019. FINDINGS: Lower chest: Scarring/atelectasis is seen in the right lower lobe posteriorly. Atherosclerotic calcifications are seen in the coronary arteries. Hepatobiliary: A 5 mm  hypoattenuating lesion in the hepatic dome likely represents a small cyst. No gallstones, gallbladder wall thickening, or biliary dilatation. Pancreas: In 8 mm cystic lesion is seen in the tail of the pancreas (series 7, image 146). Spleen: Normal in size without focal abnormality. Adrenals/Urinary Tract: Adrenal glands are unremarkable. Other than a 1 cm left renal cyst, the kidneys are normal, without renal calculi, focal lesion, or hydronephrosis. Bladder is unremarkable. Stomach/Bowel: There is bowel wall thickening and surrounding fat stranding involving the sigmoid colon associated with diverticuli in this area. This likely represents sigmoid diverticulitis given the lack of a mass lesion seen on recent colonoscopy. There is bowel distension with air-fluid levels involving the proximal large and small bowel, consistent with bowel obstruction. The degree of obstruction is increased since 08/10/2019. A small amount of stool and gas is seen in the rectum. The appendix appears normal. No evidence of abdominal abscess. A loop of sigmoid bowel abuts the dome of the bladder (series 7, image 112), however no definite evidence of fistula formation is identified. Vascular/Lymphatic: Aortic atherosclerosis. No enlarged abdominal or pelvic lymph nodes. Reproductive: Prostate is unremarkable. Other: No abdominal wall hernia or abnormality. No abdominopelvic ascites. Musculoskeletal: No acute or significant osseous findings. IMPRESSION: 1. Findings most consistent with sigmoid diverticulitis resulting in obstruction of the proximal large and small bowel. The degree of obstruction has increased since 08/10/2019. Bowel wall thickening and surrounding fat stranding involving the sigmoid colon is not specific for sigmoid diverticulitis and may also represent malignancy given that biopsy results from recent colonoscopy did not demonstrate malignancy or active inflammation. No evidence of abdominal abscess. 2. Cystic lesion in  the tail of the pancreas is indeterminate. Recommend follow up pre and post contrast MRI/MRCP or pancreatic protocol CT in 2 years. This recommendation follows ACR consensus guidelines: Management of Incidental Pancreatic Cysts: A White Paper of the ACR Incidental Findings Committee.  [place] [id number];[id number]. Aortic Atherosclerosis (ICD10-I70.0). Electronically Signed: By: Zerita Boers M.D. On: 08/27/2019 10:08   DG Abd 2 Views  Result Date: 08/27/2019 CLINICAL DATA:  Abdominal pain EXAM: ABDOMEN - 2 VIEW COMPARISON:  CT abdomen pelvis dated 08/10/2019 FINDINGS: Multiple dilated loops of small and large bowel are noted with multiple air-fluid levels, concerning for large bowel obstruction. No free intraperitoneal air is identified. No radiopaque foreign body is identified. Degenerative changes are seen in the spine. IMPRESSION: Findings concerning for large bowel obstruction. These results will be called to the ordering clinician or representative by the Radiologist Assistant, and communication documented in the PACS or [employer]. Electronically Signed   By: Zerita Boers M.D.   On: 08/27/2019 08:41   DG Abd Portable 1V-Small Bowel Protocol-Position Verification  Result Date: 08/27/2019 CLINICAL DATA:  NG tube placed EXAM: PORTABLE ABDOMEN - 1 VIEW COMPARISON:  08/27/2019 FINDINGS: Nasogastric tube with the tip projecting over the fundus of the stomach. Gaseous distension of the small bowel and colon partially visualized. No evidence of pneumoperitoneum, portal venous gas or pneumatosis. No pathologic calcifications along the expected course of the ureters. No acute osseous abnormality. IMPRESSION: Nasogastric tube with the tip projecting over the fundus of the stomach. Electronically Signed   By: Kathreen Devoid   On: 08/27/2019 13:14    EKG: Independently reviewed.  Sinus Tachycardia  Assessment/Plan Principal Problem:   SBO (small bowel obstruction) (HCC) Active Problems:    Depression, major, recurrent (HCC)   Essential hypertension   Coronary artery disease involving native coronary artery of native heart without angina pectoris   Diverticulitis    Small bowel obstruction Patient presents to the ER for evaluation of abdominal pain associated with diarrhea, anorexia and weight loss. CT scan of abdomen and pelvis shows findings consistent with sigmoid diverticulitis resulting in obstruction of the proximal large and small bowel. Surgical consult has been placed NG tube has been inserted for gastric decompression We will keep patient n.p.o. for now Supportive care   Acute diverticulitis We will start patient on IV Zosyn adjusted to renal function Supportive care with antiemetics, IV fluid hydration and pain medication  Hypertension We will place patient on IV hydralazine for systolic blood pressure greater than 173mHg Continue low-dose beta-blocker  Depression Hold all antidepressants for now   DVT prophylaxis: SCD Code Status: Full code Family Communication: Greater than 50% of time was spent discussing patient's condition and plan of care with him at the bedside.  All questions and concerns have been addressed.  He verbalizes understanding and agrees with the plan. Disposition Plan: Back to previous home environment Consults called: Surgery/GI    TCollier BullockMD [facility]     08/27/2019, 2:26 PM

## 2019-08-28 ENCOUNTER — Inpatient Hospital Stay (HOSPITAL_COMMUNITY): Payer: Medicare HMO

## 2019-08-28 DIAGNOSIS — K5792 Diverticulitis of intestine, part unspecified, without perforation or abscess without bleeding: Secondary | ICD-10-CM

## 2019-08-28 DIAGNOSIS — I10 Essential (primary) hypertension: Secondary | ICD-10-CM

## 2019-08-28 LAB — COMPREHENSIVE METABOLIC PANEL
ALT: 24 U/L (ref 0–44)
AST: 58 U/L — ABNORMAL HIGH (ref 15–41)
Albumin: 2.3 g/dL — ABNORMAL LOW (ref 3.5–5.0)
Alkaline Phosphatase: 87 U/L (ref 38–126)
Anion gap: 9 (ref 5–15)
BUN: 18 mg/dL (ref 8–23)
CO2: 23 mmol/L (ref 22–32)
Calcium: 8.4 mg/dL — ABNORMAL LOW (ref 8.9–10.3)
Chloride: 106 mmol/L (ref 98–111)
Creatinine, Ser: 1.39 mg/dL — ABNORMAL HIGH (ref 0.61–1.24)
GFR calc Af Amer: 58 mL/min — ABNORMAL LOW (ref >60)
GFR calc non Af Amer: 50 mL/min — ABNORMAL LOW (ref >60)
Glucose, Bld: 89 mg/dL (ref 70–99)
Potassium: 4.4 mmol/L (ref 3.5–5.1)
Sodium: 138 mmol/L (ref 135–145)
Total Bilirubin: 0.4 mg/dL (ref 0.3–1.2)
Total Protein: 5.3 g/dL — ABNORMAL LOW (ref 6.5–8.1)

## 2019-08-28 NOTE — Progress Notes (Signed)
PROGRESS NOTE  Roy [name] PYP:[id number] DOB: 05-31-1945 DOA: 08/26/2019 PCP: Jinny Sanders, MD   LOS: 1 day   Brief narrative: As per HPI,  Roy [name] is a 74 y.o. male with medical history significant for coronary artery disease status post stent angioplasty, hypertension, depression, obstructive sleep apnea  presented to the emergency room with complaints of abdominal pain intermittently for 1 month.  Patient was recently admitted to the hospital and at that time he had a colonoscopy and was told he had a bowel obstruction.  His GI pathogen panel was positive for Yersinia and he was treated with doxycycline.  Patient states that since his discharge he has continued to have intermittent periumbilical pain with worsening appetite and weight loss.  He did follow-up with GI after discharge.  In the ED, his labs revealed sodium level of 135, potassium of 4.6, bicarb of 20, lactic acid of 2, white count of 7.2, hemoglobin of 14.3. CT scan of abdomen pelvis showed findings most consistent with sigmoid diverticulitis resulting in obstruction of the proximal large and small bowel. The degree of obstruction has increased since 08/10/2019. Biopsy results from recent colonoscopy did not demonstrate malignancy or active inflammation.   Assessment/Plan:  Principal Problem:   Large bowel obstruction (HCC) Active Problems:   Depression, major, recurrent ([place])   Essential hypertension   Coronary artery disease involving native coronary artery of native heart without angina pectoris   Diverticulitis  Acute bowel obstruction Patient has been seen by general surgery possibility of a mass versus colitis.  CT scan shows sigmoid diverticulitis with obstruction of the proximal large and small bowel.  Currently on conservative treatment with NG tube IV fluids n.p.o. status.  Has had some flatus yesterday.  Follow surgical recommendation.  Continue conservative treatment at this time.  Acute  diverticulitis  on IV Zosyn , IV  antiemetics, IV fluid hydration and analgesia at this time.  Essential hypertension Continue IV hydralazine.  Continue NPO.  Resume Coreg when p.o. failure.  Depression Hold all antidepressants for now  DVT prophylaxis: SCDs Start: 08/27/19 1433    Code Status: DNR  Family Communication: None.  Patient does not wish me to call anyone  Status is: Inpatient  Remains inpatient appropriate because:IV treatments appropriate due to intensity of illness or inability to take PO, Inpatient level of care appropriate due to severity of illness and Bowel obstruction, surgical evaluation   Dispo: The patient is from: Home              Anticipated d/c is to: Home              Anticipated d/c date is: 3 days              Patient currently is not medically stable to d/c.   Consultants:  General surgery  GI  Procedures:  NG tube placement  Antibiotics:  . Zosyn IV 08/27/2019>  Anti-infectives (From admission, onward)   Start     Dose/Rate Route Frequency Ordered Stop   08/27/19 2100  piperacillin-tazobactam (ZOSYN) IVPB 3.375 g     Discontinue    "Followed by" Linked Group Details   3.375 g 12.5 mL/hr over 240 Minutes Intravenous Every 8 hours 08/27/19 1448     08/27/19 1500  piperacillin-tazobactam (ZOSYN) IVPB 3.375 g       "Followed by" Linked Group Details   3.375 g 100 mL/hr over 30 Minutes Intravenous  Once 08/27/19 1448 08/27/19 1752  Subjective: Today, patient was seen and examined at bedside.  Patient denies any nausea vomiting or abdominal pain had flatus and bowel movement yesterday.  Objective: Vitals:   08/28/19 0404 08/28/19 0904  BP: (!) 112/93 (!) 119/91  Pulse: 82 80  Resp: 17 16  Temp: (!) 97.4 F (36.3 C) 97.9 F (36.6 C)  SpO2: 97% 98%    Intake/Output Summary (Last 24 hours) at 08/28/2019 1125 Last data filed at 08/28/2019 1100 Gross per 24 hour  Intake 53 ml  Output 1000 ml  Net -947 ml   Filed  Weights   08/26/19 1716 08/26/19 1759  Weight: 72 kg 68 kg   Body mass index is 24.21 kg/m.   Physical Exam: GENERAL: Patient is alert awake and oriented. Not in obvious distress. HENT: No scleral pallor or icterus. Pupils equally reactive to light. Oral mucosa is moist.  NG tube in place NECK: is supple, no gross swelling noted. CHEST: Clear to auscultation. No crackles or wheezes.  Diminished breath sounds bilaterally. CVS: S1 and S2 heard, no murmur. Regular rate and rhythm.  ABDOMEN: Soft, mildly distended abdomen, tenderness over the left upper quadrant bowel sounds are present. EXTREMITIES: No edema. CNS: Cranial nerves are intact. No focal motor deficits. SKIN: warm and dry without rashes.  Data Review: I have personally reviewed the following laboratory data and studies,  CBC: Recent Labs  Lab 08/26/19 1804  WBC 7.2  HGB 14.3  HCT 42.8  MCV 88.6  PLT 502*   Basic Metabolic Panel: Recent Labs  Lab 08/26/19 1804 08/28/19 0200  NA 135 138  K 4.6 4.4  CL 104 106  CO2 20* 23  GLUCOSE 129* 89  BUN 11 18  CREATININE 1.05 1.39*  CALCIUM 8.8* 8.4*   Liver Function Tests: Recent Labs  Lab 08/28/19 0200  AST 58*  ALT 24  ALKPHOS 87  BILITOT 0.4  PROT 5.3*  ALBUMIN 2.3*   No results for input(s): LIPASE, AMYLASE in the last 168 hours. No results for input(s): AMMONIA in the last 168 hours. Cardiac Enzymes: No results for input(s): CKTOTAL, CKMB, CKMBINDEX, TROPONINI in the last 168 hours. BNP (last 3 results) Recent Labs    07/25/19 0246 07/26/19 0226  BNP 54.0 95.9    ProBNP (last 3 results) No results for input(s): PROBNP in the last 8760 hours.  CBG: Recent Labs  Lab 08/26/19 1814 08/27/19 1231  GLUCAP 123* 126*   Recent Results (from the past 240 hour(s))  SARS Coronavirus 2 by RT PCR (hospital order, performed in [facility] hospital lab) Nasopharyngeal Nasopharyngeal Swab     Status: None   Collection Time: 08/27/19 11:12 AM    Specimen: Nasopharyngeal Swab  Result Value Ref Range Status   SARS Coronavirus 2 NEGATIVE NEGATIVE Final    Comment: (NOTE) SARS-CoV-2 target nucleic acids are NOT DETECTED.  The SARS-CoV-2 RNA is generally detectable in upper and lower respiratory specimens during the acute phase of infection. The lowest concentration of SARS-CoV-2 viral copies this assay can detect is 250 copies / mL. A negative result does not preclude SARS-CoV-2 infection and should not be used as the sole basis for treatment or other patient management decisions.  A negative result may occur with improper specimen collection / handling, submission of specimen other than nasopharyngeal swab, presence of viral mutation(s) within the areas targeted by this assay, and inadequate number of viral copies (<250 copies / mL). A negative result must be combined with clinical observations, patient history, and epidemiological  information.  Fact Sheet for Patients:   StrictlyIdeas.no  Fact Sheet for Healthcare Providers: BankingDealers.co.za  This test is not yet approved or  cleared by the [place] FDA and has been authorized for detection and/or diagnosis of SARS-CoV-2 by FDA under an Emergency Use Authorization (EUA).  This EUA will remain in effect (meaning this test can be used) for the duration of the COVID-19 declaration under Section 564(b)(1) of the Act, 21 U.S.C. section 360bbb-3(b)(1), unless the authorization is terminated or revoked sooner.  Performed at [facility], [place] [address] [place], [place] [address]      Studies: DG Chest 2 View  Result Date: 08/27/2019 CLINICAL DATA:  Chest and abdominal pain EXAM: CHEST - 2 VIEW COMPARISON:  Chest radiograph dated 08/10/2018 FINDINGS: The heart size and mediastinal contours are within normal limits. Minimal atelectasis in the lingula appears decreased from prior exam. The right lung is clear. There is  no pleural effusion or pneumothorax. The visualized skeletal structures are unremarkable. IMPRESSION: No active cardiopulmonary disease. Electronically Signed   By: Zerita Boers M.D.   On: 08/27/2019 08:37   DG Abd 1 View  Result Date: 08/28/2019 CLINICAL DATA:  Abdominal pain EXAM: ABDOMEN - 1 VIEW COMPARISON:  08/27/2019 FINDINGS: Gastric catheter is noted within the stomach. Diffuse loops of dilated small bowel and proximal colon are again seen similar to that seen on prior CT examination. No free air is noted. IMPRESSION: Gastric catheter within the stomach. Persistent dilatation of the large and small bowel stable from prior CT. Electronically Signed   By: Inez Catalina M.D.   On: 08/28/2019 08:44   CT ABDOMEN PELVIS W CONTRAST  Addendum Date: 08/27/2019   ADDENDUM REPORT: 08/27/2019 10:14 ADDENDUM: These results were called by telephone on 08/27/2019 at 10:10 am to provider [facility] PA, who verbally acknowledged these results. Electronically Signed   By: Zerita Boers M.D.   On: 08/27/2019 10:14   Result Date: 08/27/2019 CLINICAL DATA:  Patient has abdominal pain. Concern for abdominal abscess. EXAM: CT ABDOMEN AND PELVIS WITH CONTRAST TECHNIQUE: Multidetector CT imaging of the abdomen and pelvis was performed using the standard protocol following bolus administration of intravenous contrast. CONTRAST:  173mL OMNIPAQUE IOHEXOL 300 MG/ML  SOLN COMPARISON:  Same day abdominal radiograph and CT abdomen pelvis dated 08/10/2019. FINDINGS: Lower chest: Scarring/atelectasis is seen in the right lower lobe posteriorly. Atherosclerotic calcifications are seen in the coronary arteries. Hepatobiliary: A 5 mm hypoattenuating lesion in the hepatic dome likely represents a small cyst. No gallstones, gallbladder wall thickening, or biliary dilatation. Pancreas: In 8 mm cystic lesion is seen in the tail of the pancreas (series 7, image 146). Spleen: Normal in size without focal abnormality. Adrenals/Urinary  Tract: Adrenal glands are unremarkable. Other than a 1 cm left renal cyst, the kidneys are normal, without renal calculi, focal lesion, or hydronephrosis. Bladder is unremarkable. Stomach/Bowel: There is bowel wall thickening and surrounding fat stranding involving the sigmoid colon associated with diverticuli in this area. This likely represents sigmoid diverticulitis given the lack of a mass lesion seen on recent colonoscopy. There is bowel distension with air-fluid levels involving the proximal large and small bowel, consistent with bowel obstruction. The degree of obstruction is increased since 08/10/2019. A small amount of stool and gas is seen in the rectum. The appendix appears normal. No evidence of abdominal abscess. A loop of sigmoid bowel abuts the dome of the bladder (series 7, image 112), however no definite evidence of fistula formation  is identified. Vascular/Lymphatic: Aortic atherosclerosis. No enlarged abdominal or pelvic lymph nodes. Reproductive: Prostate is unremarkable. Other: No abdominal wall hernia or abnormality. No abdominopelvic ascites. Musculoskeletal: No acute or significant osseous findings. IMPRESSION: 1. Findings most consistent with sigmoid diverticulitis resulting in obstruction of the proximal large and small bowel. The degree of obstruction has increased since 08/10/2019. Bowel wall thickening and surrounding fat stranding involving the sigmoid colon is not specific for sigmoid diverticulitis and may also represent malignancy given that biopsy results from recent colonoscopy did not demonstrate malignancy or active inflammation. No evidence of abdominal abscess. 2. Cystic lesion in the tail of the pancreas is indeterminate. Recommend follow up pre and post contrast MRI/MRCP or pancreatic protocol CT in 2 years. This recommendation follows ACR consensus guidelines: Management of Incidental Pancreatic Cysts: A White Paper of the ACR Incidental Findings Committee. [place]  [id number];[id number]. Aortic Atherosclerosis (ICD10-I70.0). Electronically Signed: By: Zerita Boers M.D. On: 08/27/2019 10:08   DG Abd 2 Views  Result Date: 08/27/2019 CLINICAL DATA:  Abdominal pain EXAM: ABDOMEN - 2 VIEW COMPARISON:  CT abdomen pelvis dated 08/10/2019 FINDINGS: Multiple dilated loops of small and large bowel are noted with multiple air-fluid levels, concerning for large bowel obstruction. No free intraperitoneal air is identified. No radiopaque foreign body is identified. Degenerative changes are seen in the spine. IMPRESSION: Findings concerning for large bowel obstruction. These results will be called to the ordering clinician or representative by the Radiologist Assistant, and communication documented in the PACS or [employer]. Electronically Signed   By: Zerita Boers M.D.   On: 08/27/2019 08:41   DG Abd Portable 1V-Small Bowel Protocol-Position Verification  Result Date: 08/27/2019 CLINICAL DATA:  NG tube placed EXAM: PORTABLE ABDOMEN - 1 VIEW COMPARISON:  08/27/2019 FINDINGS: Nasogastric tube with the tip projecting over the fundus of the stomach. Gaseous distension of the small bowel and colon partially visualized. No evidence of pneumoperitoneum, portal venous gas or pneumatosis. No pathologic calcifications along the expected course of the ureters. No acute osseous abnormality. IMPRESSION: Nasogastric tube with the tip projecting over the fundus of the stomach. Electronically Signed   By: Kathreen Devoid   On: 08/27/2019 13:14      Flora Lipps, MD  [facility] 08/28/2019

## 2019-08-28 NOTE — Plan of Care (Signed)

## 2019-08-28 NOTE — Plan of Care (Signed)
  Problem: Pain Managment: Goal: General experience of comfort will improve Outcome: Progressing   Problem: Safety: Goal: Ability to remain free from injury will improve Outcome: Progressing   Problem: Skin Integrity: Goal: Risk for impaired skin integrity will decrease Outcome: Progressing   

## 2019-08-28 NOTE — Progress Notes (Signed)
[facility] Progress Note     Subjective: Patient denies pain this AM. He did have some diarrhea overnight and is passing flatus. KUB this AM still shows dilated bowel.   Objective: Vital signs in last 24 hours: Temp:  [97.4 F (36.3 C)-97.9 F (36.6 C)] 97.9 F (36.6 C) (08/15 0904) Pulse Rate:  [80-95] 80 (08/15 0904) Resp:  [12-18] 16 (08/15 0904) BP: (106-133)/(79-93) 119/91 (08/15 0904) SpO2:  [94 %-98 %] 98 % (08/15 0904) Last BM Date: 08/27/19  Intake/Output from previous day: 08/14 0701 - 08/15 0700 In: 53 [I.V.:3; IV Piggyback:50] Out: 1000 [Urine:750; Emesis/NG output:250] Intake/Output this shift: No intake/output data recorded.  PE: General: pleasant, WD, WN white male who is laying in bed in NAD HEENT: head is normocephalic, atraumatic.  Sclera are noninjected.  PERRL.  Ears and nose without any masses or lesions.  Mouth is pink and moist Heart: regular, rate, and rhythm.  Normal s1,s2. No obvious murmurs, gallops, or rubs noted.  Palpable radial and pedal pulses bilaterally Lungs: CTAB, no wheezes, rhonchi, or rales noted.  Respiratory effort nonlabored Abd: soft , NT, less distended, +BS MS: all 4 extremities are symmetrical with no cyanosis, clubbing, or edema. Skin: warm and dry with no masses, lesions, or rashes Neuro: Cranial nerves 2-12 grossly intact, sensation grossly intact  Psych: A&Ox3 with an appropriate affect.    Lab Results:  Recent Labs    08/26/19 1804  WBC 7.2  HGB 14.3  HCT 42.8  PLT 471*   BMET Recent Labs    08/26/19 1804 08/28/19 0200  NA 135 138  K 4.6 4.4  CL 104 106  CO2 20* 23  GLUCOSE 129* 89  BUN 11 18  CREATININE 1.05 1.39*  CALCIUM 8.8* 8.4*   PT/INR No results for input(s): LABPROT, INR in the last 72 hours. CMP     Component Value Date/Time   NA 138 08/28/2019 0200   K 4.4 08/28/2019 0200   CL 106 08/28/2019 0200   CO2 23 08/28/2019 0200   GLUCOSE 89 08/28/2019 0200   GLUCOSE 118 (H)  12/08/2005 0741   BUN 18 08/28/2019 0200   CREATININE 1.39 (H) 08/28/2019 0200   CREATININE 1.11 04/02/2015 1514   CALCIUM 8.4 (L) 08/28/2019 0200   PROT 5.3 (L) 08/28/2019 0200   PROT 6.6 03/26/2016 0736   ALBUMIN 2.3 (L) 08/28/2019 0200   ALBUMIN 4.0 03/26/2016 0736   AST 58 (H) 08/28/2019 0200   ALT 24 08/28/2019 0200   ALKPHOS 87 08/28/2019 0200   BILITOT 0.4 08/28/2019 0200   BILITOT 0.3 03/26/2016 0736   GFRNONAA 50 (L) 08/28/2019 0200   GFRAA 58 (L) 08/28/2019 0200   Lipase     Component Value Date/Time   LIPASE 18 08/10/2019 1936       Studies/Results: DG Chest 2 View  Result Date: 08/27/2019 CLINICAL DATA:  Chest and abdominal pain EXAM: CHEST - 2 VIEW COMPARISON:  Chest radiograph dated 08/10/2018 FINDINGS: The heart size and mediastinal contours are within normal limits. Minimal atelectasis in the lingula appears decreased from prior exam. The right lung is clear. There is no pleural effusion or pneumothorax. The visualized skeletal structures are unremarkable. IMPRESSION: No active cardiopulmonary disease. Electronically Signed   By: Zerita Boers M.D.   On: 08/27/2019 08:37   DG Abd 1 View  Result Date: 08/28/2019 CLINICAL DATA:  Abdominal pain EXAM: ABDOMEN - 1 VIEW COMPARISON:  08/27/2019 FINDINGS: Gastric catheter is noted within the stomach. Diffuse loops  of dilated small bowel and proximal colon are again seen similar to that seen on prior CT examination. No free air is noted. IMPRESSION: Gastric catheter within the stomach. Persistent dilatation of the large and small bowel stable from prior CT. Electronically Signed   By: Inez Catalina M.D.   On: 08/28/2019 08:44   CT ABDOMEN PELVIS W CONTRAST  Addendum Date: 08/27/2019   ADDENDUM REPORT: 08/27/2019 10:14 ADDENDUM: These results were called by telephone on 08/27/2019 at 10:10 am to provider [facility] PA, who verbally acknowledged these results. Electronically Signed   By: Zerita Boers M.D.   On: 08/27/2019  10:14   Result Date: 08/27/2019 CLINICAL DATA:  Patient has abdominal pain. Concern for abdominal abscess. EXAM: CT ABDOMEN AND PELVIS WITH CONTRAST TECHNIQUE: Multidetector CT imaging of the abdomen and pelvis was performed using the standard protocol following bolus administration of intravenous contrast. CONTRAST:  134mL OMNIPAQUE IOHEXOL 300 MG/ML  SOLN COMPARISON:  Same day abdominal radiograph and CT abdomen pelvis dated 08/10/2019. FINDINGS: Lower chest: Scarring/atelectasis is seen in the right lower lobe posteriorly. Atherosclerotic calcifications are seen in the coronary arteries. Hepatobiliary: A 5 mm hypoattenuating lesion in the hepatic dome likely represents a small cyst. No gallstones, gallbladder wall thickening, or biliary dilatation. Pancreas: In 8 mm cystic lesion is seen in the tail of the pancreas (series 7, image 146). Spleen: Normal in size without focal abnormality. Adrenals/Urinary Tract: Adrenal glands are unremarkable. Other than a 1 cm left renal cyst, the kidneys are normal, without renal calculi, focal lesion, or hydronephrosis. Bladder is unremarkable. Stomach/Bowel: There is bowel wall thickening and surrounding fat stranding involving the sigmoid colon associated with diverticuli in this area. This likely represents sigmoid diverticulitis given the lack of a mass lesion seen on recent colonoscopy. There is bowel distension with air-fluid levels involving the proximal large and small bowel, consistent with bowel obstruction. The degree of obstruction is increased since 08/10/2019. A small amount of stool and gas is seen in the rectum. The appendix appears normal. No evidence of abdominal abscess. A loop of sigmoid bowel abuts the dome of the bladder (series 7, image 112), however no definite evidence of fistula formation is identified. Vascular/Lymphatic: Aortic atherosclerosis. No enlarged abdominal or pelvic lymph nodes. Reproductive: Prostate is unremarkable. Other: No abdominal  wall hernia or abnormality. No abdominopelvic ascites. Musculoskeletal: No acute or significant osseous findings. IMPRESSION: 1. Findings most consistent with sigmoid diverticulitis resulting in obstruction of the proximal large and small bowel. The degree of obstruction has increased since 08/10/2019. Bowel wall thickening and surrounding fat stranding involving the sigmoid colon is not specific for sigmoid diverticulitis and may also represent malignancy given that biopsy results from recent colonoscopy did not demonstrate malignancy or active inflammation. No evidence of abdominal abscess. 2. Cystic lesion in the tail of the pancreas is indeterminate. Recommend follow up pre and post contrast MRI/MRCP or pancreatic protocol CT in 2 years. This recommendation follows ACR consensus guidelines: Management of Incidental Pancreatic Cysts: A White Paper of the ACR Incidental Findings Committee. [place] [id number];23:[id number]. Aortic Atherosclerosis (ICD10-I70.0). Electronically Signed: By: Zerita Boers M.D. On: 08/27/2019 10:08   DG Abd 2 Views  Result Date: 08/27/2019 CLINICAL DATA:  Abdominal pain EXAM: ABDOMEN - 2 VIEW COMPARISON:  CT abdomen pelvis dated 08/10/2019 FINDINGS: Multiple dilated loops of small and large bowel are noted with multiple air-fluid levels, concerning for large bowel obstruction. No free intraperitoneal air is identified. No radiopaque foreign body is identified. Degenerative  changes are seen in the spine. IMPRESSION: Findings concerning for large bowel obstruction. These results will be called to the ordering clinician or representative by the Radiologist Assistant, and communication documented in the PACS or [employer]. Electronically Signed   By: Zerita Boers M.D.   On: 08/27/2019 08:41   DG Abd Portable 1V-Small Bowel Protocol-Position Verification  Result Date: 08/27/2019 CLINICAL DATA:  NG tube placed EXAM: PORTABLE ABDOMEN - 1 VIEW COMPARISON:  08/27/2019 FINDINGS:  Nasogastric tube with the tip projecting over the fundus of the stomach. Gaseous distension of the small bowel and colon partially visualized. No evidence of pneumoperitoneum, portal venous gas or pneumatosis. No pathologic calcifications along the expected course of the ureters. No acute osseous abnormality. IMPRESSION: Nasogastric tube with the tip projecting over the fundus of the stomach. Electronically Signed   By: Kathreen Devoid   On: 08/27/2019 13:14    Anti-infectives: Anti-infectives (From admission, onward)   Start     Dose/Rate Route Frequency Ordered Stop   08/27/19 2100  piperacillin-tazobactam (ZOSYN) IVPB 3.375 g     Discontinue    "Followed by" Linked Group Details   3.375 g 12.5 mL/hr over 240 Minutes Intravenous Every 8 hours 08/27/19 1448     08/27/19 1500  piperacillin-tazobactam (ZOSYN) IVPB 3.375 g       "Followed by" Linked Group Details   3.375 g 100 mL/hr over 30 Minutes Intravenous  Once 08/27/19 1448 08/27/19 1752       Assessment/Plan HTN HLD CADs/p PCI 2001 and 2016- hold plavix.  ECHO7/12 shows EF 70-75%. Cardiology reports patient is moderate cardiovascular risk DM - A1c 6.2 OSA AKI -Cr1.39, IVF  LBO - mass vs colitis - patient returns with similar symptoms noted previously - continue NGT for decompression and NPO - check prealbumin tomorrow AM with labs  - Agree with GI that given recent colonoscopy, malignancy seems unlikely - may need rectal contrast study tomorrow - no indication for emergent surgical intervention at this time, but may end up needing surgical intervention at some point this week  FEN: NPO, ice chips, IVF VTE: SCDs, ok to have lovenox from a surgical standpoint  ID: Zosyn 8/14>>  LOS: 1 day    Norm Parcel , [facility] 08/28/2019, 10:52 AM Please see Amion for pager number during day hours 7:00am-4:30pm

## 2019-08-29 ENCOUNTER — Encounter (HOSPITAL_COMMUNITY): Payer: Self-pay | Admitting: Internal Medicine

## 2019-08-29 ENCOUNTER — Inpatient Hospital Stay (HOSPITAL_COMMUNITY): Payer: Medicare HMO

## 2019-08-29 ENCOUNTER — Ambulatory Visit: Payer: Medicare HMO | Admitting: Nurse Practitioner

## 2019-08-29 DIAGNOSIS — K56601 Complete intestinal obstruction, unspecified as to cause: Secondary | ICD-10-CM

## 2019-08-29 DIAGNOSIS — R933 Abnormal findings on diagnostic imaging of other parts of digestive tract: Secondary | ICD-10-CM | POA: Diagnosis not present

## 2019-08-29 DIAGNOSIS — Z8719 Personal history of other diseases of the digestive system: Secondary | ICD-10-CM | POA: Diagnosis not present

## 2019-08-29 LAB — CBC
HCT: 35.5 % — ABNORMAL LOW (ref 39.0–52.0)
Hemoglobin: 11.9 g/dL — ABNORMAL LOW (ref 13.0–17.0)
MCH: 30.5 pg (ref 26.0–34.0)
MCHC: 33.5 g/dL (ref 30.0–36.0)
MCV: 91 fL (ref 80.0–100.0)
Platelets: 281 10*3/uL (ref 150–400)
RBC: 3.9 MIL/uL — ABNORMAL LOW (ref 4.22–5.81)
RDW: 14.9 % (ref 11.5–15.5)
WBC: 6.4 10*3/uL (ref 4.0–10.5)
nRBC: 0 % (ref 0.0–0.2)

## 2019-08-29 LAB — COMPREHENSIVE METABOLIC PANEL
ALT: 19 U/L (ref 0–44)
AST: 29 U/L (ref 15–41)
Albumin: 2.2 g/dL — ABNORMAL LOW (ref 3.5–5.0)
Alkaline Phosphatase: 84 U/L (ref 38–126)
Anion gap: 10 (ref 5–15)
BUN: 7 mg/dL — ABNORMAL LOW (ref 8–23)
CO2: 21 mmol/L — ABNORMAL LOW (ref 22–32)
Calcium: 7.9 mg/dL — ABNORMAL LOW (ref 8.9–10.3)
Chloride: 106 mmol/L (ref 98–111)
Creatinine, Ser: 1.14 mg/dL (ref 0.61–1.24)
GFR calc Af Amer: >60 mL/min (ref >60)
GFR calc non Af Amer: >60 mL/min (ref >60)
Glucose, Bld: 63 mg/dL — ABNORMAL LOW (ref 70–99)
Potassium: 3.6 mmol/L (ref 3.5–5.1)
Sodium: 137 mmol/L (ref 135–145)
Total Bilirubin: 0.6 mg/dL (ref 0.3–1.2)
Total Protein: 5 g/dL — ABNORMAL LOW (ref 6.5–8.1)

## 2019-08-29 LAB — PHOSPHORUS: Phosphorus: 2.8 mg/dL (ref 2.5–4.6)

## 2019-08-29 LAB — PREALBUMIN: Prealbumin: 15 mg/dL — ABNORMAL LOW (ref 18–38)

## 2019-08-29 LAB — MAGNESIUM: Magnesium: 1.4 mg/dL — ABNORMAL LOW (ref 1.7–2.4)

## 2019-08-29 MED ORDER — IOHEXOL 300 MG/ML  SOLN
300.0000 mL | Freq: Once | INTRAMUSCULAR | Status: AC | PRN
  Administered 2019-08-29: 450 mL

## 2019-08-29 MED ORDER — VENLAFAXINE HCL ER 75 MG PO CP24
75.0000 mg | ORAL_CAPSULE | Freq: Every day | ORAL | Status: DC
  Administered 2019-08-29 – 2019-09-06 (×8): 75 mg via ORAL
  Filled 2019-08-29 (×10): qty 1

## 2019-08-29 MED ORDER — MAGNESIUM SULFATE 4 GM/100ML IV SOLN
4.0000 g | Freq: Once | INTRAVENOUS | Status: AC
  Administered 2019-08-29: 4 g via INTRAVENOUS
  Filled 2019-08-29: qty 100

## 2019-08-29 NOTE — Progress Notes (Addendum)
PROGRESS NOTE  Roy [name] INO:[id number] DOB: [birth year] DOA: 08/26/2019 PCP: Jinny Sanders, MD   LOS: 2 days   Brief narrative: As per HPI,  Roy [name] is a 74 y.o. male with medical history significant for coronary artery disease status post stent angioplasty, hypertension, depression, obstructive sleep apnea  presented to the emergency room with complaints of abdominal pain intermittently for 1 month.  Patient was recently admitted to the hospital and at that time he had a colonoscopy and was told he had a bowel obstruction.  His GI pathogen panel was positive for Yersinia and he was treated with doxycycline.  Patient states that since his discharge he has continued to have intermittent periumbilical pain with worsening appetite and weight loss.  He did follow-up with GI after discharge.  In the ED, his labs revealed sodium level of 135, potassium of 4.6, bicarb of 20, lactic acid of 2, white count of 7.2, hemoglobin of 14.3. CT scan of abdomen pelvis showed findings most consistent with sigmoid diverticulitis resulting in obstruction of the proximal large and small bowel. The degree of obstruction has increased since 08/10/2019. Biopsy results from recent colonoscopy did not demonstrate malignancy or active inflammation.   Assessment/Plan:  Principal Problem:   Large bowel obstruction (HCC) Active Problems:   Depression, major, recurrent ([place])   Essential hypertension   Coronary artery disease involving native coronary artery of native heart without angina pectoris   Diverticulitis  Acute bowel obstruction Patient has been seen by general surgery possibility of a mass versus colitis.  CT scan shows sigmoid diverticulitis with obstruction of the proximal large and small bowel.  Currently on conservative treatment with NG tube, IV fluids n.p.o. status.  No bowel movement or flatus.  Follow surgical recommendation.  Continue conservative treatment at this time.  Acute  diverticulitis  on IV Zosyn , IV  antiemetics, IV fluid hydration and analgesia at this time.  Essential hypertension Continue IV hydralazine.  Continue NPO.  Resume Coreg when p.o. ok. BP ok so far.  Depression Hold all antidepressants for now  Hypomagnesemia. Will replace IV, check in am.  DVT prophylaxis: SCDs Start: 08/27/19 1433    Code Status: DNR  Family Communication: None.  Patient does not wish me to call anyone  Status is: Inpatient  Remains inpatient appropriate because:IV treatments appropriate due to intensity of illness or inability to take PO, Inpatient level of care appropriate due to severity of illness and Bowel obstruction,    Dispo: The patient is from: Home              Anticipated d/c is to: Home              Anticipated d/c date is: 2-3 days              Patient currently is not medically stable to d/c.   Consultants:  General surgery  GI  Procedures:  NG tube placement  Antibiotics:  . Zosyn IV 08/27/2019>  Anti-infectives (From admission, onward)   Start     Dose/Rate Route Frequency Ordered Stop   08/27/19 2100  piperacillin-tazobactam (ZOSYN) IVPB 3.375 g     Discontinue    "Followed by" Linked Group Details   3.375 g 12.5 mL/hr over 240 Minutes Intravenous Every 8 hours 08/27/19 1448     08/27/19 1500  piperacillin-tazobactam (ZOSYN) IVPB 3.375 g       "Followed by" Linked Group Details   3.375 g 100 mL/hr over 30 Minutes Intravenous  Once 08/27/19 1448 08/27/19 1752     Subjective: Today, patient was seen and examined at bedside.  Patient states couldn't sleep well, mild pain. No nausea, vomiting or bowel movement.  Objective: Vitals:   08/28/19 2004 08/29/19 0431  BP: 132/85 137/88  Pulse: 83 77  Resp: 17 16  Temp: 98.2 F (36.8 C) 97.6 F (36.4 C)  SpO2: 96% 97%    Intake/Output Summary (Last 24 hours) at 08/29/2019 0746 Last data filed at 08/29/2019 0433 Gross per 24 hour  Intake 1651.21 ml  Output 850 ml  Net  801.21 ml   Filed Weights   08/26/19 1716 08/26/19 1759  Weight: 72 kg 68 kg   Body mass index is 24.21 kg/m.   Physical Exam: GENERAL: Patient is alert awake and oriented. Not in obvious distress. HENT: No scleral pallor or icterus. Pupils equally reactive to light. Oral mucosa is moist.  NG tube in place NECK: is supple, no gross swelling noted. CHEST: Clear to auscultation. No crackles or wheezes.  Diminished breath sounds bilaterally. CVS: S1 and S2 heard, no murmur. Regular rate and rhythm.  ABDOMEN: Soft, mildly distended abdomen, tenderness over the Right lower abdomen, EXTREMITIES: No edema. CNS: Cranial nerves are intact. No focal motor deficits. SKIN: warm and dry without rashes.  Data Review: I have personally reviewed the following laboratory data and studies,  CBC: Recent Labs  Lab 08/26/19 1804 08/29/19 0057  WBC 7.2 6.4  HGB 14.3 11.9*  HCT 42.8 35.5*  MCV 88.6 91.0  PLT 471* 193   Basic Metabolic Panel: Recent Labs  Lab 08/26/19 1804 08/28/19 0200 08/29/19 0057  NA 135 138 137  K 4.6 4.4 3.6  CL 104 106 106  CO2 20* 23 21*  GLUCOSE 129* 89 63*  BUN 11 18 7*  CREATININE 1.05 1.39* 1.14  CALCIUM 8.8* 8.4* 7.9*  MG  --   --  1.4*  PHOS  --   --  2.8   Liver Function Tests: Recent Labs  Lab 08/28/19 0200 08/29/19 0057  AST 58* 29  ALT 24 19  ALKPHOS 87 84  BILITOT 0.4 0.6  PROT 5.3* 5.0*  ALBUMIN 2.3* 2.2*   No results for input(s): LIPASE, AMYLASE in the last 168 hours. No results for input(s): AMMONIA in the last 168 hours. Cardiac Enzymes: No results for input(s): CKTOTAL, CKMB, CKMBINDEX, TROPONINI in the last 168 hours. BNP (last 3 results) Recent Labs    07/25/19 0246 07/26/19 0226  BNP 54.0 95.9    ProBNP (last 3 results) No results for input(s): PROBNP in the last 8760 hours.  CBG: Recent Labs  Lab 08/26/19 1814 08/27/19 1231  GLUCAP 123* 126*   Recent Results (from the past 240 hour(s))  SARS Coronavirus 2 by RT  PCR (hospital order, performed in [facility] hospital lab) Nasopharyngeal Nasopharyngeal Swab     Status: None   Collection Time: 08/27/19 11:12 AM   Specimen: Nasopharyngeal Swab  Result Value Ref Range Status   SARS Coronavirus 2 NEGATIVE NEGATIVE Final    Comment: (NOTE) SARS-CoV-2 target nucleic acids are NOT DETECTED.  The SARS-CoV-2 RNA is generally detectable in upper and lower respiratory specimens during the acute phase of infection. The lowest concentration of SARS-CoV-2 viral copies this assay can detect is 250 copies / mL. A negative result does not preclude SARS-CoV-2 infection and should not be used as the sole basis for treatment or other patient management decisions.  A negative result may occur with improper specimen  collection / handling, submission of specimen other than nasopharyngeal swab, presence of viral mutation(s) within the areas targeted by this assay, and inadequate number of viral copies (<250 copies / mL). A negative result must be combined with clinical observations, patient history, and epidemiological information.  Fact Sheet for Patients:   StrictlyIdeas.no  Fact Sheet for Healthcare Providers: BankingDealers.co.za  This test is not yet approved or  cleared by the [place] FDA and has been authorized for detection and/or diagnosis of SARS-CoV-2 by FDA under an Emergency Use Authorization (EUA).  This EUA will remain in effect (meaning this test can be used) for the duration of the COVID-19 declaration under Section 564(b)(1) of the Act, 21 U.S.C. section 360bbb-3(b)(1), unless the authorization is terminated or revoked sooner.  Performed at [facility], [place] [address], [place], [place] [address]      Studies: DG Chest 2 View  Result Date: 08/27/2019 CLINICAL DATA:  Chest and abdominal pain EXAM: CHEST - 2 VIEW COMPARISON:  Chest radiograph dated 08/10/2018 FINDINGS: The heart size  and mediastinal contours are within normal limits. Minimal atelectasis in the lingula appears decreased from prior exam. The right lung is clear. There is no pleural effusion or pneumothorax. The visualized skeletal structures are unremarkable. IMPRESSION: No active cardiopulmonary disease. Electronically Signed   By: Zerita Boers M.D.   On: 08/27/2019 08:37   DG Abd 1 View  Result Date: 08/28/2019 CLINICAL DATA:  Abdominal pain EXAM: ABDOMEN - 1 VIEW COMPARISON:  08/27/2019 FINDINGS: Gastric catheter is noted within the stomach. Diffuse loops of dilated small bowel and proximal colon are again seen similar to that seen on prior CT examination. No free air is noted. IMPRESSION: Gastric catheter within the stomach. Persistent dilatation of the large and small bowel stable from prior CT. Electronically Signed   By: Inez Catalina M.D.   On: 08/28/2019 08:44   CT ABDOMEN PELVIS W CONTRAST  Addendum Date: 08/27/2019   ADDENDUM REPORT: 08/27/2019 10:14 ADDENDUM: These results were called by telephone on 08/27/2019 at 10:10 am to provider [facility] PA, who verbally acknowledged these results. Electronically Signed   By: Zerita Boers M.D.   On: 08/27/2019 10:14   Result Date: 08/27/2019 CLINICAL DATA:  Patient has abdominal pain. Concern for abdominal abscess. EXAM: CT ABDOMEN AND PELVIS WITH CONTRAST TECHNIQUE: Multidetector CT imaging of the abdomen and pelvis was performed using the standard protocol following bolus administration of intravenous contrast. CONTRAST:  119mL OMNIPAQUE IOHEXOL 300 MG/ML  SOLN COMPARISON:  Same day abdominal radiograph and CT abdomen pelvis dated 08/10/2019. FINDINGS: Lower chest: Scarring/atelectasis is seen in the right lower lobe posteriorly. Atherosclerotic calcifications are seen in the coronary arteries. Hepatobiliary: A 5 mm hypoattenuating lesion in the hepatic dome likely represents a small cyst. No gallstones, gallbladder wall thickening, or biliary dilatation.  Pancreas: In 8 mm cystic lesion is seen in the tail of the pancreas (series 7, image 146). Spleen: Normal in size without focal abnormality. Adrenals/Urinary Tract: Adrenal glands are unremarkable. Other than a 1 cm left renal cyst, the kidneys are normal, without renal calculi, focal lesion, or hydronephrosis. Bladder is unremarkable. Stomach/Bowel: There is bowel wall thickening and surrounding fat stranding involving the sigmoid colon associated with diverticuli in this area. This likely represents sigmoid diverticulitis given the lack of a mass lesion seen on recent colonoscopy. There is bowel distension with air-fluid levels involving the proximal large and small bowel, consistent with bowel obstruction. The degree of obstruction is increased since  08/10/2019. A small amount of stool and gas is seen in the rectum. The appendix appears normal. No evidence of abdominal abscess. A loop of sigmoid bowel abuts the dome of the bladder (series 7, image 112), however no definite evidence of fistula formation is identified. Vascular/Lymphatic: Aortic atherosclerosis. No enlarged abdominal or pelvic lymph nodes. Reproductive: Prostate is unremarkable. Other: No abdominal wall hernia or abnormality. No abdominopelvic ascites. Musculoskeletal: No acute or significant osseous findings. IMPRESSION: 1. Findings most consistent with sigmoid diverticulitis resulting in obstruction of the proximal large and small bowel. The degree of obstruction has increased since 08/10/2019. Bowel wall thickening and surrounding fat stranding involving the sigmoid colon is not specific for sigmoid diverticulitis and may also represent malignancy given that biopsy results from recent colonoscopy did not demonstrate malignancy or active inflammation. No evidence of abdominal abscess. 2. Cystic lesion in the tail of the pancreas is indeterminate. Recommend follow up pre and post contrast MRI/MRCP or pancreatic protocol CT in 2 years. This  recommendation follows ACR consensus guidelines: Management of Incidental Pancreatic Cysts: A White Paper of the ACR Incidental Findings Committee. [place] 9242;68:[id number]. Aortic Atherosclerosis (ICD10-I70.0). Electronically Signed: By: Zerita Boers M.D. On: 08/27/2019 10:08   DG Abd 2 Views  Result Date: 08/27/2019 CLINICAL DATA:  Abdominal pain EXAM: ABDOMEN - 2 VIEW COMPARISON:  CT abdomen pelvis dated 08/10/2019 FINDINGS: Multiple dilated loops of small and large bowel are noted with multiple air-fluid levels, concerning for large bowel obstruction. No free intraperitoneal air is identified. No radiopaque foreign body is identified. Degenerative changes are seen in the spine. IMPRESSION: Findings concerning for large bowel obstruction. These results will be called to the ordering clinician or representative by the Radiologist Assistant, and communication documented in the PACS or [employer]. Electronically Signed   By: Zerita Boers M.D.   On: 08/27/2019 08:41   DG Abd Portable 1V-Small Bowel Protocol-Position Verification  Result Date: 08/27/2019 CLINICAL DATA:  NG tube placed EXAM: PORTABLE ABDOMEN - 1 VIEW COMPARISON:  08/27/2019 FINDINGS: Nasogastric tube with the tip projecting over the fundus of the stomach. Gaseous distension of the small bowel and colon partially visualized. No evidence of pneumoperitoneum, portal venous gas or pneumatosis. No pathologic calcifications along the expected course of the ureters. No acute osseous abnormality. IMPRESSION: Nasogastric tube with the tip projecting over the fundus of the stomach. Electronically Signed   By: Kathreen Devoid   On: 08/27/2019 13:14      Flora Lipps, MD  [facility] 08/29/2019

## 2019-08-29 NOTE — Telephone Encounter (Signed)
Patient currently admitted at the hospital.

## 2019-08-29 NOTE — Telephone Encounter (Signed)
Patient currently in hospital.

## 2019-08-29 NOTE — Plan of Care (Signed)

## 2019-08-29 NOTE — Progress Notes (Addendum)
Daily Rounding Note  08/29/2019, 8:17 AM  LOS: 2 days   SUBJECTIVE:   Chief complaint: abdominal pain.  N/V.  diverticulitis    Pain comes and goes, 2 to 3/10 severity.  No nausea, no BM's.  Has  Not used pain meds. 250 mL output from NGT yeasterday.   C/o poor sleep, chronic issue worsened by current worrying.    OBJECTIVE:         Vital signs in last 24 hours:    Temp:  [97.6 F (36.4 C)-98.2 F (36.8 C)] 98.2 F (36.8 C) (08/16 0731) Pulse Rate:  [76-86] 76 (08/16 0731) Resp:  [16-17] 17 (08/16 0731) BP: (119-137)/(84-91) 136/84 (08/16 0731) SpO2:  [96 %-99 %] 99 % (08/16 0731) Last BM Date: 08/27/19 Filed Weights   08/26/19 1716 08/26/19 1759  Weight: 72 kg 68 kg   General: comfortable, not ill looking.  Facial rubor/rosy cheeks   Heart: RRR Chest: clear bil.  No SOB or cough Abdomen: soft, NT, ND.  BS quiet, no tinkling/tympanitis sounds ~ 200 ml bilious yellow clear fluid in NGT Extremities: no CCE Neuro/Psych:  HOH.  No weakness, tremors or deficits.    Intake/Output from previous day: 08/15 0701 - 08/16 0700 In: 1651.2 [I.V.:1556.8; IV Piggyback:94.4] Out: 850 [Urine:850]  Intake/Output this shift: No intake/output data recorded.  Lab Results: Recent Labs    08/26/19 1804 08/29/19 0057  WBC 7.2 6.4  HGB 14.3 11.9*  HCT 42.8 35.5*  PLT 471* 281   BMET Recent Labs    08/26/19 1804 08/28/19 0200 08/29/19 0057  NA 135 138 137  K 4.6 4.4 3.6  CL 104 106 106  CO2 20* 23 21*  GLUCOSE 129* 89 63*  BUN 11 18 7*  CREATININE 1.05 1.39* 1.14  CALCIUM 8.8* 8.4* 7.9*   LFT Recent Labs    08/28/19 0200 08/29/19 0057  PROT 5.3* 5.0*  ALBUMIN 2.3* 2.2*  AST 58* 29  ALT 24 19  ALKPHOS 87 84  BILITOT 0.4 0.6   PT/INR No results for input(s): LABPROT, INR in the last 72 hours. Hepatitis Panel No results for input(s): HEPBSAG, HCVAB, HEPAIGM, HEPBIGM in the last 72  hours.  Studies/Results: DG Chest 2 View  Result Date: 08/27/2019 CLINICAL DATA:  Chest and abdominal pain EXAM: CHEST - 2 VIEW COMPARISON:  Chest radiograph dated 08/10/2018 FINDINGS: The heart size and mediastinal contours are within normal limits. Minimal atelectasis in the lingula appears decreased from prior exam. The right lung is clear. There is no pleural effusion or pneumothorax. The visualized skeletal structures are unremarkable. IMPRESSION: No active cardiopulmonary disease. Electronically Signed   By: Zerita Boers M.D.   On: 08/27/2019 08:37   DG Abd 1 View  Result Date: 08/28/2019 CLINICAL DATA:  Abdominal pain EXAM: ABDOMEN - 1 VIEW COMPARISON:  08/27/2019 FINDINGS: Gastric catheter is noted within the stomach. Diffuse loops of dilated small bowel and proximal colon are again seen similar to that seen on prior CT examination. No free air is noted. IMPRESSION: Gastric catheter within the stomach. Persistent dilatation of the large and small bowel stable from prior CT. Electronically Signed   By: Inez Catalina M.D.   On: 08/28/2019 08:44   CT ABDOMEN PELVIS W CONTRAST  Addendum Date: 08/27/2019   ADDENDUM REPORT: 08/27/2019 10:14 ADDENDUM: These results were called by telephone on 08/27/2019 at 10:10 am to provider [facility] PA, who verbally acknowledged these results. Electronically Signed  By: Zerita Boers M.D.   On: 08/27/2019 10:14   Result Date: 08/27/2019 CLINICAL DATA:  Patient has abdominal pain. Concern for abdominal abscess. EXAM: CT ABDOMEN AND PELVIS WITH CONTRAST TECHNIQUE: Multidetector CT imaging of the abdomen and pelvis was performed using the standard protocol following bolus administration of intravenous contrast. CONTRAST:  19mL OMNIPAQUE IOHEXOL 300 MG/ML  SOLN COMPARISON:  Same day abdominal radiograph and CT abdomen pelvis dated 08/10/2019. FINDINGS: Lower chest: Scarring/atelectasis is seen in the right lower lobe posteriorly. Atherosclerotic calcifications  are seen in the coronary arteries. Hepatobiliary: A 5 mm hypoattenuating lesion in the hepatic dome likely represents a small cyst. No gallstones, gallbladder wall thickening, or biliary dilatation. Pancreas: In 8 mm cystic lesion is seen in the tail of the pancreas (series 7, image 146). Spleen: Normal in size without focal abnormality. Adrenals/Urinary Tract: Adrenal glands are unremarkable. Other than a 1 cm left renal cyst, the kidneys are normal, without renal calculi, focal lesion, or hydronephrosis. Bladder is unremarkable. Stomach/Bowel: There is bowel wall thickening and surrounding fat stranding involving the sigmoid colon associated with diverticuli in this area. This likely represents sigmoid diverticulitis given the lack of a mass lesion seen on recent colonoscopy. There is bowel distension with air-fluid levels involving the proximal large and small bowel, consistent with bowel obstruction. The degree of obstruction is increased since 08/10/2019. A small amount of stool and gas is seen in the rectum. The appendix appears normal. No evidence of abdominal abscess. A loop of sigmoid bowel abuts the dome of the bladder (series 7, image 112), however no definite evidence of fistula formation is identified. Vascular/Lymphatic: Aortic atherosclerosis. No enlarged abdominal or pelvic lymph nodes. Reproductive: Prostate is unremarkable. Other: No abdominal wall hernia or abnormality. No abdominopelvic ascites. Musculoskeletal: No acute or significant osseous findings. IMPRESSION: 1. Findings most consistent with sigmoid diverticulitis resulting in obstruction of the proximal large and small bowel. The degree of obstruction has increased since 08/10/2019. Bowel wall thickening and surrounding fat stranding involving the sigmoid colon is not specific for sigmoid diverticulitis and may also represent malignancy given that biopsy results from recent colonoscopy did not demonstrate malignancy or active inflammation.  No evidence of abdominal abscess. 2. Cystic lesion in the tail of the pancreas is indeterminate. Recommend follow up pre and post contrast MRI/MRCP or pancreatic protocol CT in 2 years. This recommendation follows ACR consensus guidelines: Management of Incidental Pancreatic Cysts: A White Paper of the ACR Incidental Findings Committee. [place] 1308;65:[id number]. Aortic Atherosclerosis (ICD10-I70.0). Electronically Signed: By: Zerita Boers M.D. On: 08/27/2019 10:08   DG Abd 2 Views  Result Date: 08/27/2019 CLINICAL DATA:  Abdominal pain EXAM: ABDOMEN - 2 VIEW COMPARISON:  CT abdomen pelvis dated 08/10/2019 FINDINGS: Multiple dilated loops of small and large bowel are noted with multiple air-fluid levels, concerning for large bowel obstruction. No free intraperitoneal air is identified. No radiopaque foreign body is identified. Degenerative changes are seen in the spine. IMPRESSION: Findings concerning for large bowel obstruction. These results will be called to the ordering clinician or representative by the Radiologist Assistant, and communication documented in the PACS or [employer]. Electronically Signed   By: Zerita Boers M.D.   On: 08/27/2019 08:41   DG Abd Portable 1V-Small Bowel Protocol-Position Verification  Result Date: 08/27/2019 CLINICAL DATA:  NG tube placed EXAM: PORTABLE ABDOMEN - 1 VIEW COMPARISON:  08/27/2019 FINDINGS: Nasogastric tube with the tip projecting over the fundus of the stomach. Gaseous distension of the small bowel  and colon partially visualized. No evidence of pneumoperitoneum, portal venous gas or pneumatosis. No pathologic calcifications along the expected course of the ureters. No acute osseous abnormality. IMPRESSION: Nasogastric tube with the tip projecting over the fundus of the stomach. Electronically Signed   By: Kathreen Devoid   On: 08/27/2019 13:14   Scheduled Meds: . diatrizoate meglumine-sodium  90 mL Per NG tube Once  . pantoprazole (PROTONIX) IV   40 mg Intravenous Q24H  . sodium chloride flush  3 mL Intravenous Q12H   Continuous Infusions: . sodium chloride 100 mL/hr at 08/29/19 0433  . magnesium sulfate bolus IVPB    . piperacillin-tazobactam (ZOSYN)  IV 3.375 g (08/29/19 0545)   PRN Meds:.hydrALAZINE, morphine injection, nitroGLYCERIN, ondansetron **OR** ondansetron (ZOFRAN) IV   ASSESMENT:   *  Recurrent abd pain, N/V.  Sigmoid diverticulitis w obstruction.  Elevated Lactate, normal WBCs.   Zosyn d 3 Diarrhea, ? Sigmoid mass/cancer: 07/26/19 Colonoscopy w congested mucosa proximal sigmoid, no mass, no obstruction, ? diverticulitis associated colitis.  Treated w abx.  Started Lialda in late July.   Surgery following.  *   AKI. Mild.  Resolved.    *   PCM, low pre-albumin.  In setting of N/V, poor po intake for many weeks.     *   Indeterminate pancreatic cystic lesion per CT.  Consistently normal Lipase.     *   [place]bell anemia.   *   Insomnia.  At home takes Effexor for anxiety/depression and he is not getting this at present  Dose at home is 75 mg capsule daily   PLAN   *   ? Start TNA given poor nutrition?    *   Gen surgery planning BariumGG enema study today.    *   OK to clamp NGT for 90 minutes to give po meds, needs to restart Effexor.   *   Continue zosyn.  I ordered the Effexor and clamp NGT x 90 m after swallowing.      Roy [name]  08/29/2019, 8:17 AM Phone [phone]

## 2019-08-29 NOTE — Progress Notes (Signed)
Subjective: Intermittent abdominal pain with "gurgling" from one side to the other.  No nausea.  Minimal output from NGT.  No flatus.  No stool.  Doesn't remember the last time he had a BM.  Frustrated with lack of progression  ROS: See above, otherwise other systems negative  Objective: Vital signs in last 24 hours: Temp:  [97.6 F (36.4 C)-98.2 F (36.8 C)] 98.2 F (36.8 C) (08/16 0731) Pulse Rate:  [76-86] 76 (08/16 0731) Resp:  [16-17] 17 (08/16 0731) BP: (132-137)/(84-88) 136/84 (08/16 0731) SpO2:  [96 %-99 %] 99 % (08/16 0731) Last BM Date: 08/27/19  Intake/Output from previous day: 08/15 0701 - 08/16 0700 In: 1651.2 [I.V.:1556.8; IV Piggyback:94.4] Out: 850 [Urine:850] Intake/Output this shift: No intake/output data recorded.  PE: Heart: regular Lungs: CTAB Abd: soft, but with some distention, minimal NGT output, few BS, mildly tender across abdomen.  Lab Results:  Recent Labs    08/26/19 1804 08/29/19 0057  WBC 7.2 6.4  HGB 14.3 11.9*  HCT 42.8 35.5*  PLT 471* 281   BMET Recent Labs    08/28/19 0200 08/29/19 0057  NA 138 137  K 4.4 3.6  CL 106 106  CO2 23 21*  GLUCOSE 89 63*  BUN 18 7*  CREATININE 1.39* 1.14  CALCIUM 8.4* 7.9*   PT/INR No results for input(s): LABPROT, INR in the last 72 hours. CMP     Component Value Date/Time   NA 137 08/29/2019 0057   K 3.6 08/29/2019 0057   CL 106 08/29/2019 0057   CO2 21 (L) 08/29/2019 0057   GLUCOSE 63 (L) 08/29/2019 0057   GLUCOSE 118 (H) 12/08/2005 0741   BUN 7 (L) 08/29/2019 0057   CREATININE 1.14 08/29/2019 0057   CREATININE 1.11 04/02/2015 1514   CALCIUM 7.9 (L) 08/29/2019 0057   PROT 5.0 (L) 08/29/2019 0057   PROT 6.6 03/26/2016 0736   ALBUMIN 2.2 (L) 08/29/2019 0057   ALBUMIN 4.0 03/26/2016 0736   AST 29 08/29/2019 0057   ALT 19 08/29/2019 0057   ALKPHOS 84 08/29/2019 0057   BILITOT 0.6 08/29/2019 0057   BILITOT 0.3 03/26/2016 0736   GFRNONAA >60 08/29/2019 0057   GFRAA >60  08/29/2019 0057   Lipase     Component Value Date/Time   LIPASE 18 08/10/2019 1936       Studies/Results: DG Abd 1 View  Result Date: 08/28/2019 CLINICAL DATA:  Abdominal pain EXAM: ABDOMEN - 1 VIEW COMPARISON:  08/27/2019 FINDINGS: Gastric catheter is noted within the stomach. Diffuse loops of dilated small bowel and proximal colon are again seen similar to that seen on prior CT examination. No free air is noted. IMPRESSION: Gastric catheter within the stomach. Persistent dilatation of the large and small bowel stable from prior CT. Electronically Signed   By: Inez Catalina M.D.   On: 08/28/2019 08:44   DG Abd Portable 1V-Small Bowel Protocol-Position Verification  Result Date: 08/27/2019 CLINICAL DATA:  NG tube placed EXAM: PORTABLE ABDOMEN - 1 VIEW COMPARISON:  08/27/2019 FINDINGS: Nasogastric tube with the tip projecting over the fundus of the stomach. Gaseous distension of the small bowel and colon partially visualized. No evidence of pneumoperitoneum, portal venous gas or pneumatosis. No pathologic calcifications along the expected course of the ureters. No acute osseous abnormality. IMPRESSION: Nasogastric tube with the tip projecting over the fundus of the stomach. Electronically Signed   By: Kathreen Devoid   On: 08/27/2019 13:14    Anti-infectives: Anti-infectives (From admission,  onward)   Start     Dose/Rate Route Frequency Ordered Stop   08/27/19 2100  piperacillin-tazobactam (ZOSYN) IVPB 3.375 g     Discontinue    "Followed by" Linked Group Details   3.375 g 12.5 mL/hr over 240 Minutes Intravenous Every 8 hours 08/27/19 1448     08/27/19 1500  piperacillin-tazobactam (ZOSYN) IVPB 3.375 g       "Followed by" Linked Group Details   3.375 g 100 mL/hr over 30 Minutes Intravenous  Once 08/27/19 1448 08/27/19 1752       Assessment/Plan HTN HLD CADs/p PCI 2001 and 2016- hold plavix.  ECHO7/12 shows EF 70-75%. Cardiology reports patient is moderate cardiovascular risk DM  - A1c 6.2 OSA AKI -Cr1.14, IVFs  LBO - mass vs colitis - patient returns with similar symptoms noted previously - prealbumin 15 -will obtain a barium enema today to further evaluate the colon for possible etiology of obstructive symptoms. -will call, Gilda, ex-wife, to give update per patient request -given minimal output, likely doesn't need NGT but will await BE prior to any further changes in care plan.  FEN: NPO, ice chips, IVF VTE: SCDs, ok to have lovenox from a surgical standpoint  ID: Zosyn 8/14>>   LOS: 2 days    Henreitta Cea , [facility] Surgery 08/29/2019, 10:23 AM Please see Amion for pager number during day hours 7:00am-4:30pm or 7:00am -11:30am on weekends

## 2019-08-29 NOTE — Plan of Care (Signed)
  Problem: Pain Managment: Goal: General experience of comfort will improve Outcome: Progressing   Problem: Safety: Goal: Ability to remain free from injury will improve Outcome: Progressing   Problem: Skin Integrity: Goal: Risk for impaired skin integrity will decrease Outcome: Progressing   

## 2019-08-30 ENCOUNTER — Encounter (HOSPITAL_COMMUNITY): Payer: Self-pay | Admitting: Internal Medicine

## 2019-08-30 ENCOUNTER — Encounter (HOSPITAL_COMMUNITY)
Admission: EM | Disposition: A | Discharge status: Home Health | Payer: Self-pay | Source: Home / Self Care | Attending: Family Medicine

## 2019-08-30 ENCOUNTER — Inpatient Hospital Stay (HOSPITAL_COMMUNITY): Payer: Medicare HMO | Admitting: Anesthesiology

## 2019-08-30 HISTORY — PX: COLON RESECTION SIGMOID: SHX6737

## 2019-08-30 HISTORY — PX: COLOSTOMY: SHX63

## 2019-08-30 LAB — CBC
HCT: 38.1 % — ABNORMAL LOW (ref 39.0–52.0)
Hemoglobin: 12.6 g/dL — ABNORMAL LOW (ref 13.0–17.0)
MCH: 30.1 pg (ref 26.0–34.0)
MCHC: 33.1 g/dL (ref 30.0–36.0)
MCV: 90.9 fL (ref 80.0–100.0)
Platelets: 303 10*3/uL (ref 150–400)
RBC: 4.19 MIL/uL — ABNORMAL LOW (ref 4.22–5.81)
RDW: 14.7 % (ref 11.5–15.5)
WBC: 9.6 10*3/uL (ref 4.0–10.5)
nRBC: 0 % (ref 0.0–0.2)

## 2019-08-30 LAB — COMPREHENSIVE METABOLIC PANEL
ALT: 16 U/L (ref 0–44)
AST: 20 U/L (ref 15–41)
Albumin: 2.2 g/dL — ABNORMAL LOW (ref 3.5–5.0)
Alkaline Phosphatase: 87 U/L (ref 38–126)
Anion gap: 12 (ref 5–15)
BUN: 8 mg/dL (ref 8–23)
CO2: 19 mmol/L — ABNORMAL LOW (ref 22–32)
Calcium: 8 mg/dL — ABNORMAL LOW (ref 8.9–10.3)
Chloride: 105 mmol/L (ref 98–111)
Creatinine, Ser: 1.12 mg/dL (ref 0.61–1.24)
GFR calc Af Amer: >60 mL/min (ref >60)
GFR calc non Af Amer: >60 mL/min (ref >60)
Glucose, Bld: 59 mg/dL — ABNORMAL LOW (ref 70–99)
Potassium: 3.8 mmol/L (ref 3.5–5.1)
Sodium: 136 mmol/L (ref 135–145)
Total Bilirubin: 1.1 mg/dL (ref 0.3–1.2)
Total Protein: 5.3 g/dL — ABNORMAL LOW (ref 6.5–8.1)

## 2019-08-30 LAB — GLUCOSE, CAPILLARY
Glucose-Capillary: 53 mg/dL — ABNORMAL LOW (ref 70–99)
Glucose-Capillary: 193 mg/dL — ABNORMAL HIGH (ref 70–99)

## 2019-08-30 LAB — PHOSPHORUS: Phosphorus: 2.3 mg/dL — ABNORMAL LOW (ref 2.5–4.6)

## 2019-08-30 LAB — MAGNESIUM: Magnesium: 1.5 mg/dL — ABNORMAL LOW (ref 1.7–2.4)

## 2019-08-30 SURGERY — COLECTOMY, SIGMOID, OPEN
Anesthesia: General | Site: Abdomen

## 2019-08-30 MED ORDER — CHLORHEXIDINE GLUCONATE 0.12 % MT SOLN
OROMUCOSAL | Status: AC
  Administered 2019-08-30: 15 mL via OROMUCOSAL
  Filled 2019-08-30: qty 15

## 2019-08-30 MED ORDER — LACTATED RINGERS IV SOLN: INTRAVENOUS | Status: DC

## 2019-08-30 MED ORDER — ONDANSETRON HCL 4 MG/2ML IJ SOLN: 4.0000 mg | Freq: Once | INTRAMUSCULAR | Status: DC | PRN

## 2019-08-30 MED ORDER — ROCURONIUM BROMIDE 10 MG/ML (PF) SYRINGE
PREFILLED_SYRINGE | INTRAVENOUS | Status: DC | PRN
  Administered 2019-08-30: 50 mg via INTRAVENOUS
  Administered 2019-08-30 (×2): 20 mg via INTRAVENOUS

## 2019-08-30 MED ORDER — FENTANYL CITRATE (PF) 100 MCG/2ML IJ SOLN
INTRAMUSCULAR | Status: AC
  Filled 2019-08-30: qty 2

## 2019-08-30 MED ORDER — ACETAMINOPHEN 10 MG/ML IV SOLN
1000.0000 mg | Freq: Four times a day (QID) | INTRAVENOUS | Status: AC
  Administered 2019-08-30 – 2019-08-31 (×3): 1000 mg via INTRAVENOUS
  Filled 2019-08-30 (×2): qty 100

## 2019-08-30 MED ORDER — PROPOFOL 10 MG/ML IV BOLUS
INTRAVENOUS | Status: AC
  Filled 2019-08-30: qty 20

## 2019-08-30 MED ORDER — ROCURONIUM BROMIDE 10 MG/ML (PF) SYRINGE
PREFILLED_SYRINGE | INTRAVENOUS | Status: AC
  Filled 2019-08-30: qty 10

## 2019-08-30 MED ORDER — 0.9 % SODIUM CHLORIDE (POUR BTL) OPTIME
TOPICAL | Status: DC | PRN
  Administered 2019-08-30 (×4): 1000 mL

## 2019-08-30 MED ORDER — MORPHINE SULFATE (PF) 2 MG/ML IV SOLN
2.0000 mg | INTRAVENOUS | Status: DC | PRN
  Administered 2019-08-30: 2 mg via INTRAVENOUS
  Filled 2019-08-30: qty 1

## 2019-08-30 MED ORDER — OXYCODONE HCL 5 MG/5ML PO SOLN: 5.0000 mg | Freq: Once | ORAL | Status: DC | PRN

## 2019-08-30 MED ORDER — SUCCINYLCHOLINE CHLORIDE 20 MG/ML IJ SOLN
INTRAMUSCULAR | Status: DC | PRN
  Administered 2019-08-30: 120 mg via INTRAVENOUS

## 2019-08-30 MED ORDER — DEXTROSE 50 % IV SOLN
50.0000 mL | Freq: Once | INTRAVENOUS | Status: AC
  Administered 2019-08-30: 50 mL via INTRAVENOUS
  Filled 2019-08-30: qty 50

## 2019-08-30 MED ORDER — METHOCARBAMOL 1000 MG/10ML IJ SOLN
500.0000 mg | Freq: Four times a day (QID) | INTRAVENOUS | Status: DC | PRN
  Filled 2019-08-30: qty 5

## 2019-08-30 MED ORDER — POTASSIUM PHOSPHATES 15 MMOLE/5ML IV SOLN
30.0000 mmol | Freq: Once | INTRAVENOUS | Status: DC
  Filled 2019-08-30: qty 10

## 2019-08-30 MED ORDER — LABETALOL HCL 5 MG/ML IV SOLN
INTRAVENOUS | Status: DC | PRN
  Administered 2019-08-30: 5 mg via INTRAVENOUS

## 2019-08-30 MED ORDER — ONDANSETRON HCL 4 MG/2ML IJ SOLN
INTRAMUSCULAR | Status: AC
  Filled 2019-08-30: qty 2

## 2019-08-30 MED ORDER — FENTANYL CITRATE (PF) 100 MCG/2ML IJ SOLN
25.0000 ug | INTRAMUSCULAR | Status: DC | PRN
  Administered 2019-08-30: 50 ug via INTRAVENOUS

## 2019-08-30 MED ORDER — CHLORHEXIDINE GLUCONATE 0.12 % MT SOLN
15.0000 mL | Freq: Once | OROMUCOSAL | Status: AC
  Filled 2019-08-30: qty 15

## 2019-08-30 MED ORDER — OXYCODONE HCL 5 MG PO TABS: 5.0000 mg | ORAL_TABLET | Freq: Once | ORAL | Status: DC | PRN

## 2019-08-30 MED ORDER — ONDANSETRON HCL 4 MG/2ML IJ SOLN
INTRAMUSCULAR | Status: DC | PRN
  Administered 2019-08-30: 4 mg via INTRAVENOUS

## 2019-08-30 MED ORDER — PROPOFOL 10 MG/ML IV BOLUS
INTRAVENOUS | Status: DC | PRN
  Administered 2019-08-30: 130 mg via INTRAVENOUS

## 2019-08-30 MED ORDER — LIDOCAINE 2% (20 MG/ML) 5 ML SYRINGE
INTRAMUSCULAR | Status: DC | PRN
  Administered 2019-08-30: 80 mg via INTRAVENOUS

## 2019-08-30 MED ORDER — FENTANYL CITRATE (PF) 250 MCG/5ML IJ SOLN
INTRAMUSCULAR | Status: AC
  Filled 2019-08-30: qty 5

## 2019-08-30 MED ORDER — DEXAMETHASONE SODIUM PHOSPHATE 10 MG/ML IJ SOLN
INTRAMUSCULAR | Status: AC
  Filled 2019-08-30: qty 1

## 2019-08-30 MED ORDER — LIDOCAINE 2% (20 MG/ML) 5 ML SYRINGE
INTRAMUSCULAR | Status: AC
  Filled 2019-08-30: qty 5

## 2019-08-30 MED ORDER — SUGAMMADEX SODIUM 200 MG/2ML IV SOLN
INTRAVENOUS | Status: DC | PRN
  Administered 2019-08-30: 200 mg via INTRAVENOUS

## 2019-08-30 MED ORDER — SUCCINYLCHOLINE CHLORIDE 200 MG/10ML IV SOSY
PREFILLED_SYRINGE | INTRAVENOUS | Status: AC
  Filled 2019-08-30: qty 10

## 2019-08-30 MED ORDER — DEXAMETHASONE SODIUM PHOSPHATE 10 MG/ML IJ SOLN
INTRAMUSCULAR | Status: DC | PRN
  Administered 2019-08-30: 5 mg via INTRAVENOUS

## 2019-08-30 MED ORDER — MAGNESIUM SULFATE 2 GM/50ML IV SOLN
2.0000 g | Freq: Once | INTRAVENOUS | Status: DC
  Filled 2019-08-30: qty 50

## 2019-08-30 MED ORDER — FENTANYL CITRATE (PF) 250 MCG/5ML IJ SOLN
INTRAMUSCULAR | Status: DC | PRN
  Administered 2019-08-30: 25 ug via INTRAVENOUS
  Administered 2019-08-30: 50 ug via INTRAVENOUS
  Administered 2019-08-30: 25 ug via INTRAVENOUS
  Administered 2019-08-30 (×3): 50 ug via INTRAVENOUS

## 2019-08-30 MED ORDER — LABETALOL HCL 5 MG/ML IV SOLN
INTRAVENOUS | Status: AC
  Filled 2019-08-30: qty 4

## 2019-08-30 MED ORDER — PHENYLEPHRINE 40 MCG/ML (10ML) SYRINGE FOR IV PUSH (FOR BLOOD PRESSURE SUPPORT)
PREFILLED_SYRINGE | INTRAVENOUS | Status: DC | PRN
  Administered 2019-08-30 (×3): 80 ug via INTRAVENOUS

## 2019-08-30 MED ORDER — DEXTROSE 50 % IV SOLN
12.5000 g | INTRAVENOUS | Status: DC
  Filled 2019-08-30: qty 50

## 2019-08-30 MED ORDER — PHENYLEPHRINE HCL-NACL 10-0.9 MG/250ML-% IV SOLN
INTRAVENOUS | Status: DC | PRN
  Administered 2019-08-30: 40 ug/min via INTRAVENOUS

## 2019-08-30 SURGICAL SUPPLY — 38 items
BLADE CLIPPER SURG (BLADE) ×1 IMPLANT
CANISTER SUCT 3000ML PPV (MISCELLANEOUS) ×1 IMPLANT
COVER SURGICAL LIGHT HANDLE (MISCELLANEOUS) ×4 IMPLANT
COVER WAND RF STERILE (DRAPES) ×2 IMPLANT
DRSG OPSITE POSTOP 4X10 (GAUZE/BANDAGES/DRESSINGS) IMPLANT
DRSG OPSITE POSTOP 4X8 (GAUZE/BANDAGES/DRESSINGS) IMPLANT
ELECT BLADE 4.0 EZ CLEAN MEGAD (MISCELLANEOUS) ×2
ELECT CAUTERY BLADE 6.4 (BLADE) ×4 IMPLANT
ELECT REM PT RETURN 9FT ADLT (ELECTROSURGICAL) ×2
ELECTRODE BLDE 4.0 EZ CLN MEGD (MISCELLANEOUS) IMPLANT
ELECTRODE REM PT RTRN 9FT ADLT (ELECTROSURGICAL) ×1 IMPLANT
GLOVE BIO SURGEON STRL SZ 6 (GLOVE) ×3 IMPLANT
GLOVE INDICATOR 6.5 STRL GRN (GLOVE) ×3 IMPLANT
GOWN STRL REUS W/ TWL LRG LVL3 (GOWN DISPOSABLE) ×6 IMPLANT
GOWN STRL REUS W/TWL LRG LVL3 (GOWN DISPOSABLE) ×12
KIT OSTOMY DRAINABLE 2.75 STR (WOUND CARE) ×1 IMPLANT
KIT SIGMOIDOSCOPE (SET/KITS/TRAYS/PACK) IMPLANT
KIT TURNOVER KIT B (KITS) ×2 IMPLANT
LIGASURE IMPACT 36 18CM CVD LR (INSTRUMENTS) ×1 IMPLANT
NS IRRIG 1000ML POUR BTL (IV SOLUTION) ×4 IMPLANT
PACK COLON (CUSTOM PROCEDURE TRAY) ×2 IMPLANT
PAD ARMBOARD 7.5X6 YLW CONV (MISCELLANEOUS) ×2 IMPLANT
PENCIL BUTTON HOLSTER BLD 10FT (ELECTRODE) ×2 IMPLANT
SPONGE LAP 18X18 RF (DISPOSABLE) ×2 IMPLANT
STAPLER CUT CVD 40MM GREEN (STAPLE) ×1 IMPLANT
STAPLER PROXIMATE 100MM BLUE (MISCELLANEOUS) ×1 IMPLANT
STAPLER VISISTAT 35W (STAPLE) ×2 IMPLANT
SURGILUBE 2OZ TUBE FLIPTOP (MISCELLANEOUS) IMPLANT
SUT PDS AB 1 TP1 96 (SUTURE) ×4 IMPLANT
SUT PROLENE 2 0 CT2 30 (SUTURE) ×1 IMPLANT
SUT PROLENE 2 0 KS (SUTURE) IMPLANT
SUT SILK 2 0 SH CR/8 (SUTURE) ×2 IMPLANT
SUT SILK 2 0 TIES 10X30 (SUTURE) ×2 IMPLANT
SUT SILK 3 0 SH CR/8 (SUTURE) ×2 IMPLANT
SUT SILK 3 0 TIES 10X30 (SUTURE) ×2 IMPLANT
SUT VIC AB 3-0 SH 18 (SUTURE) ×2 IMPLANT
TRAY FOLEY MTR SLVR 16FR STAT (SET/KITS/TRAYS/PACK) ×1 IMPLANT
TUBE CONNECTING 12X1/4 (SUCTIONS) ×4 IMPLANT

## 2019-08-30 NOTE — Anesthesia Procedure Notes (Signed)
Procedure Name: Intubation Date/Time: 08/30/2019 9:07 AM Performed by: Kyung Rudd, CRNA Pre-anesthesia Checklist: Patient identified, Emergency Drugs available, Suction available and Patient being monitored Patient Re-evaluated:Patient Re-evaluated prior to induction Oxygen Delivery Method: Circle system utilized Preoxygenation: Pre-oxygenation with 100% oxygen Induction Type: IV induction, Rapid sequence and Cricoid Pressure applied Laryngoscope Size: Mac and 4 Grade View: Grade I Tube type: Oral Tube size: 7.5 mm Number of attempts: 1 Airway Equipment and Method: Stylet Placement Confirmation: ETT inserted through vocal cords under direct vision,  positive ETCO2 and breath sounds checked- equal and bilateral Secured at: 20 cm Tube secured with: Tape Dental Injury: Teeth and Oropharynx as per pre-operative assessment

## 2019-08-30 NOTE — Plan of Care (Signed)
  Problem: Education: Goal: Knowledge of General Education information will improve Description: Including pain rating scale, medication(s)/side effects and non-pharmacologic comfort measures 08/30/2019 0900 by Baldomero Lamy, RN Outcome: Progressing 08/30/2019 0858 by Baldomero Lamy, RN Outcome: Progressing   Problem: Health Behavior/Discharge Planning: Goal: Ability to manage health-related needs will improve 08/30/2019 0900 by Baldomero Lamy, RN Outcome: Progressing 08/30/2019 0858 by Baldomero Lamy, RN Outcome: Progressing   Problem: Clinical Measurements: Goal: Ability to maintain clinical measurements within normal limits will improve 08/30/2019 0900 by Baldomero Lamy, RN Outcome: Progressing 08/30/2019 0858 by Baldomero Lamy, RN Outcome: Progressing Goal: Will remain free from infection 08/30/2019 0900 by Baldomero Lamy, RN Outcome: Progressing 08/30/2019 0858 by Baldomero Lamy, RN Outcome: Progressing Goal: Diagnostic test results will improve 08/30/2019 0900 by Baldomero Lamy, RN Outcome: Progressing 08/30/2019 0858 by Baldomero Lamy, RN Outcome: Progressing Goal: Respiratory complications will improve 08/30/2019 0900 by Baldomero Lamy, RN Outcome: Progressing 08/30/2019 0858 by Baldomero Lamy, RN Outcome: Progressing Goal: Cardiovascular complication will be avoided 08/30/2019 0900 by Baldomero Lamy, RN Outcome: Progressing 08/30/2019 0858 by Baldomero Lamy, RN Outcome: Progressing   Problem: Activity: Goal: Risk for activity intolerance will decrease 08/30/2019 0900 by Baldomero Lamy, RN Outcome: Progressing 08/30/2019 0858 by Baldomero Lamy, RN Outcome: Progressing   Problem: Nutrition: Goal: Adequate nutrition will be maintained 08/30/2019 0900 by Baldomero Lamy, RN Outcome: Progressing 08/30/2019 0858 by Baldomero Lamy, RN Outcome: Progressing   Problem: Coping: Goal: Level of anxiety will decrease 08/30/2019 0900 by Baldomero Lamy, RN Outcome: Progressing 08/30/2019 0858 by Baldomero Lamy, RN Outcome: Progressing   Problem: Elimination: Goal: Will not experience complications related to bowel motility 08/30/2019 0900 by Baldomero Lamy, RN Outcome: Progressing 08/30/2019 0858 by Baldomero Lamy, RN Outcome: Progressing Goal: Will not experience complications related to urinary retention 08/30/2019 0900 by Baldomero Lamy, RN Outcome: Progressing 08/30/2019 0858 by Baldomero Lamy, RN Outcome: Progressing   Problem: Pain Managment: Goal: General experience of comfort will improve 08/30/2019 0900 by Baldomero Lamy, RN Outcome: Progressing 08/30/2019 0858 by Baldomero Lamy, RN Outcome: Progressing   Problem: Safety: Goal: Ability to remain free from injury will improve 08/30/2019 0900 by Baldomero Lamy, RN Outcome: Progressing 08/30/2019 0858 by Baldomero Lamy, RN Outcome: Progressing   Problem: Skin Integrity: Goal: Risk for impaired skin integrity will decrease 08/30/2019 0900 by Baldomero Lamy, RN Outcome: Progressing 08/30/2019 0858 by Baldomero Lamy, RN Outcome: Progressing

## 2019-08-30 NOTE — Progress Notes (Signed)
Pt arrived with full colostomy bag and began stooling. hcomb drsg saturated. Entire colostomy setup changed by k hickling rn and hcomb changed.

## 2019-08-30 NOTE — Progress Notes (Signed)
Notified Roy [name] of [facility] that pt has had 5 loose stools last night, he states its from BE yesterday. Blood glucose is 59 this AM. Pt is asymptomatic and is NPO and receiving IV fluids. RN will continue to monitor.

## 2019-08-30 NOTE — Plan of Care (Signed)

## 2019-08-30 NOTE — Progress Notes (Signed)
Hypoglycemic Event  CBG: 53  Treatment: D50 50 mL (25 gm)  Symptoms: None  Follow-up CBG: Time: 0848 CBG Result:192  Possible Reasons for Event: Other: NPO  Comments/MD notified: Patient without history of DM. Prolonged NPO status. Dr. Fransisco Beau notified and orders received.     Kreg Shropshire

## 2019-08-30 NOTE — Progress Notes (Signed)
Subjective: Diarrhea from the gastrograffin kept him up all night. Still with pain and distention.   ROS: See above, otherwise other systems negative  Objective: Vital signs in last 24 hours: Temp:  [98.1 F (36.7 C)-99.4 F (37.4 C)] 98.9 F (37.2 C) (08/17 0300) Pulse Rate:  [79-82] 82 (08/17 0300) Resp:  [15-17] 16 (08/17 0300) BP: (133-145)/(82-95) 143/95 (08/17 0300) SpO2:  [94 %-100 %] 98 % (08/17 0300) Last BM Date: 08/27/19  Intake/Output from previous day: 08/16 0701 - 08/17 0700 In: 360 [P.O.:360] Out: 1350 [Urine:1050; Emesis/NG output:300] Intake/Output this shift: No intake/output data recorded.  PE: Heart: regular Lungs: CTAB Abd: soft, but with some distention, minimal NGT output, few BS, mildly tender across abdomen.  Lab Results:  Recent Labs    08/29/19 0057 08/30/19 0247  WBC 6.4 9.6  HGB 11.9* 12.6*  HCT 35.5* 38.1*  PLT 281 303   BMET Recent Labs    08/29/19 0057 08/30/19 0247  NA 137 136  K 3.6 3.8  CL 106 105  CO2 21* 19*  GLUCOSE 63* 59*  BUN 7* 8  CREATININE 1.14 1.12  CALCIUM 7.9* 8.0*   PT/INR No results for input(s): LABPROT, INR in the last 72 hours. CMP     Component Value Date/Time   NA 136 08/30/2019 0247   K 3.8 08/30/2019 0247   CL 105 08/30/2019 0247   CO2 19 (L) 08/30/2019 0247   GLUCOSE 59 (L) 08/30/2019 0247   GLUCOSE 118 (H) 12/08/2005 0741   BUN 8 08/30/2019 0247   CREATININE 1.12 08/30/2019 0247   CREATININE 1.11 04/02/2015 1514   CALCIUM 8.0 (L) 08/30/2019 0247   PROT 5.3 (L) 08/30/2019 0247   PROT 6.6 03/26/2016 0736   ALBUMIN 2.2 (L) 08/30/2019 0247   ALBUMIN 4.0 03/26/2016 0736   AST 20 08/30/2019 0247   ALT 16 08/30/2019 0247   ALKPHOS 87 08/30/2019 0247   BILITOT 1.1 08/30/2019 0247   BILITOT 0.3 03/26/2016 0736   GFRNONAA >60 08/30/2019 0247   GFRAA >60 08/30/2019 0247   Lipase     Component Value Date/Time   LIPASE 18 08/10/2019 1936       Studies/Results: DG Abd 1  View  Result Date: 08/28/2019 CLINICAL DATA:  Abdominal pain EXAM: ABDOMEN - 1 VIEW COMPARISON:  08/27/2019 FINDINGS: Gastric catheter is noted within the stomach. Diffuse loops of dilated small bowel and proximal colon are again seen similar to that seen on prior CT examination. No free air is noted. IMPRESSION: Gastric catheter within the stomach. Persistent dilatation of the large and small bowel stable from prior CT. Electronically Signed   By: Inez Catalina M.D.   On: 08/28/2019 08:44   DG BE (COLON)W SINGLE CM (SOL OR THIN BA)  Result Date: 08/29/2019 CLINICAL DATA:  Sigmoid colon wall thickening with possible bowel obstruction EXAM: WATER SOLUBLE CONTRAST ENEMA TECHNIQUE: Water-soluble contrast enema performed using 450 cc of Omnipaque 300 diluted in 1500 mL of water. Contrast medium was instilled under slow low gravity conditions. FLUOROSCOPY TIME:  Fluoroscopy Time:  3 minutes, 30 seconds Radiation Exposure Index (if provided by the fluoroscopic device): 131.7 Number of Acquired Spot Images: 7 COMPARISON:  Multiple exams, including CT abdomen from 08/27/2019 FINDINGS: Initial KUB demonstrates gaseous prominence of small bowel and large bowel suspicious for ileus or bowel obstruction. A nasogastric tube is present in the stomach body. Initially the rectum was filled without insufflation of the retention balloon. For some of the  later images, I did inflate the retention balloon due to partial lack of containment of the enema. On series 6, contrast is noted to extend through a very thin, irregular lumen of the sigmoid colon along a 6.1 cm sharply defined segment as shown on the recent CT examination. However, contrast also extends from the lumen of this segment into a longitudinally oriented collection which extends partially along the irregular segment of sigmoid colon, partially just below dilated segment of colon extending to this irregular narrowed segment. This is probably tracking longitudinally in  the colon wall (for example, see image 18/11) but could conceivably represent a small contained collection or even a colocolic fistula. The connection of this collection to the narrowed lumen is shown on image 6/7. The relationship of the narrowed luminal channel intramural or contained collection of contrast along the inferior colon margin is also shown on image 2/20. Because of the sluggish nature of contrast extension into the dilated segment, the more proximal colon was not filled. IMPRESSION: 1. 6.1 cm markedly thinned and irregular sigmoid colon lumen, especially along a approximately 3 cm segment immediately adjacent to the dilated descending colon. Given the irregularity of the lumen, and the presence of diverticula, this could well be from severe diverticulosis related wall thickening, possibly with some active diverticulitis; based purely on imaging, exclusion of tumor in this vicinity is not readily feasible. I was able to slowly pass contrast through this narrowed lumen in a retrograde manner, but given how narrow this segment is, it may well be acting to cause partial obstruction. 2. Connected to this irregular and thinned lumen, there a 5 cm in length longitudinal collection of contrast tracking either within the inferior wall of the involved segment, or representing a linear, contained perforation tracking in a along the margin of the wall of the colon. Although outside of the colon lumen, this does not spread freely in the abdomen despite turning and tilting the patient. Electronically Signed   By: Van Clines M.D.   On: 08/29/2019 16:30    Anti-infectives: Anti-infectives (From admission, onward)   Start     Dose/Rate Route Frequency Ordered Stop   08/27/19 2100  piperacillin-tazobactam (ZOSYN) IVPB 3.375 g     Discontinue    "Followed by" Linked Group Details   3.375 g 12.5 mL/hr over 240 Minutes Intravenous Every 8 hours 08/27/19 1448     08/27/19 1500  piperacillin-tazobactam  (ZOSYN) IVPB 3.375 g       "Followed by" Linked Group Details   3.375 g 100 mL/hr over 30 Minutes Intravenous  Once 08/27/19 1448 08/27/19 1752       Assessment/Plan HTN HLD CADs/p PCI 2001 and 2016- hold plavix.  ECHO7/12 shows EF 70-75%. Cardiology reports patient is moderate cardiovascular risk DM - A1c 6.2 OSA AKI -Cr1.12, IVFs  LBO - mass vs colitis - patient returns with similar symptoms noted previously - prealbumin 15 - BE confirms narrowed irregular segment with possible contained perforation - At this point the patient has had ongoing obstructive symptoms for over a month, has lost about 10kg based on the data in Epic. I discussed with him options going forward including stent which would be a temporary treatment vs proceeding with resection of the diseased segment with possible ostomy creation. Given duration of symtoms, nutrition level, and distention of proximal colon I advised that a stoma would be the safest approach. We discussed the risks of surgery including bleeding, pain, scarring, injury to intraabdominal or retroperitoneal structures, intraabdominal or  wound infection, wound healing problems/ hernia, ileus, need for other procedures, prolonged hospitalization/ recovery, as well as risk of acute cardiovascular, pulmonary, thromboembolic complications. Questions were welcomed and answered to his satisfaction. He wishes to proceed with surgery, will do so today.   FEN: NPO, ice chips, IVF VTE: SCDs, ok to have lovenox from a surgical standpoint  ID: Zosyn 8/14>>     LOS: 3 days    Clovis Riley , MD [facility] Surgery 08/30/2019, 7:40 AM Please see Amion for pager number during day hours 7:00am-4:30pm or 7:00am -11:30am on weekends

## 2019-08-30 NOTE — Progress Notes (Signed)
Inpatient Diabetes Program Recommendations  AACE/ADA: New Consensus Statement on Inpatient Glycemic Control (2015)  Target Ranges:  Prepandial:   less than 140 mg/dL      Peak postprandial:   less than 180 mg/dL (1-2 hours)      Critically ill patients:  140 - 180 mg/dL   Lab Results  Component Value Date   GLUCAP 193 (H) 08/30/2019   HGBA1C 6.2 (H) 07/23/2019    Review of Glycemic Control Results for ALIOUNE, HODGKIN" (MRN [id number]) as of 08/30/2019 12:16  Ref. Range 08/30/2019 08:22 08/30/2019 08:48  Glucose-Capillary Latest Ref Range: 70 - 99 mg/dL 53 (L) 193 (H)   Diabetes history: Type 2 DM Outpatient Diabetes medications: none Current orders for Inpatient glycemic control: none Decadron 5 mg QD  Inpatient Diabetes Program Recommendations:    Noted hypoglycemia this AM, assuming related to NPO status. However, would recommend adding CBG checks Q4H.   Thanks, Bronson Curb, MSN, RNC-OB Diabetes Coordinator [phone] (8a-5p)

## 2019-08-30 NOTE — Consult Note (Addendum)
[place] Nurse ostomy follow up Went to patient's room, [place] 5 N19 to mark for possible ileostomy and colostomy. Patient had already been taken to surgery and could not be marked. Val Riles, RN, MSN, CWOCN, CNS-BC, pager [phone]

## 2019-08-30 NOTE — Progress Notes (Signed)
PROGRESS NOTE  [name] JKD:[id number] DOB: 30-Oct-1945 DOA: 08/26/2019 PCP: Roy Sanders, MD   LOS: 3 days   Brief narrative: As per HPI,  Roy [name] is a 74 y.o. male with medical history significant for coronary artery disease status post stent angioplasty, hypertension, depression, obstructive sleep apnea  presented to the emergency room with complaints of abdominal pain intermittently for 1 month.  Patient was recently admitted to the hospital and at that time he had a colonoscopy and was told he had a bowel obstruction.  His GI pathogen panel was positive for Yersinia and he was treated with doxycycline.  Patient states that since his discharge he has continued to have intermittent periumbilical pain with worsening appetite and weight loss.  He did follow-up with GI after discharge.  In the ED, his labs revealed sodium level of 135, potassium of 4.6, bicarb of 20, lactic acid of 2, white count of 7.2, hemoglobin of 14.3. CT scan of abdomen pelvis showed findings most consistent with sigmoid diverticulitis resulting in obstruction of the proximal large and small bowel. The degree of obstruction has increased since 08/10/2019. Biopsy results from recent colonoscopy did not demonstrate malignancy or active inflammation.  Patient was then admitted to hospital for further evaluation and treatment.  Assessment/Plan:  Principal Problem:   Large bowel obstruction (HCC) Active Problems:   Depression, major, recurrent ([place])   Essential hypertension   Coronary artery disease involving native coronary artery of native heart without angina pectoris   Diverticulitis  Acute bowel obstruction Patient has been seen by general surgery for a possibility of a mass versus colitis.  CT scan showed sigmoid diverticulitis with obstruction of the proximal large and small bowel.  Patient was initially on conservative treatment with NG tube, IV fluids n.p.o. status.    Patient had barium enema  done which showed irregular narrowing at the sigmoid colon region.  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  Patient is still very symptomatic.  Spoke with general surgery at bedside.  Plan is surgical intervention today.  WBC at 9.6.  Acute diverticulitis  on IV Zosyn , IV  antiemetics, IV fluid hydration and analgesia at this time.  Essential hypertension Continue IV hydralazine.  Continue NPO.  Resume Coreg when p.o. ok.   Depression Holding oral medication.  Hypomagnesemia. Will replace IV, check in am.  Hypophosphatemia.  Will replenish with IV K-Phos 30 mmol today.  Check levels in a.m.  DVT prophylaxis: SCDs Start: 08/27/19 1433    Code Status: DNR  Family Communication: None.  Patient does not wish me to call anyone  Status is: Inpatient  Remains inpatient appropriate because:IV treatments appropriate due to intensity of illness or inability to take PO, Inpatient level of care appropriate due to severity of illness and Bowel obstruction, likely surgical intervention   Dispo: The patient is from: Home              Anticipated d/c is to: Home              Anticipated d/c date is: 2-3 days              Patient currently is not medically stable to d/c.  Awaiting for surgical intervention  Consultants:  General surgery  GI  Procedures:  NG tube placement  Antibiotics:  . Zosyn IV 08/27/2019>  Anti-infectives (From admission, onward)   Start     Dose/Rate Route Frequency Ordered Stop   08/27/19 2100  [MAR Hold]  piperacillin-tazobactam (ZOSYN) IVPB  3.375 g     Discontinue     (MAR Hold since Tue 08/30/2019 at 0821.Hold Reason: Transfer to a Procedural area.)  "Followed by" Linked Group Details   3.375 g 12.5 mL/hr over 240 Minutes Intravenous Every 8 hours 08/27/19 1448     08/27/19 1500  piperacillin-tazobactam (ZOSYN) IVPB 3.375 g       "Followed by" Linked Group Details   3.375 g 100 mL/hr over 30 Minutes Intravenous  Once 08/27/19 1448 08/27/19 1752      Subjective: Today, patient was seen and examined at bedside.  Patient complains of a few loose bowel movements but still complains of abdominal pain.  No nausea or vomiting.  Objective: Vitals:   08/30/19 0300 08/30/19 0801  BP: (!) 143/95 (!) 143/102  Pulse: 82 83  Resp: 16 17  Temp: 98.9 F (37.2 C) 97.8 F (36.6 C)  SpO2: 98% 97%    Intake/Output Summary (Last 24 hours) at 08/30/2019 1031 Last data filed at 08/30/2019 1000 Gross per 24 hour  Intake 240 ml  Output 1525 ml  Net -1285 ml   Filed Weights   08/26/19 1716 08/26/19 1759  Weight: 72 kg 68 kg   Body mass index is 24.21 kg/m.   Physical Exam: GENERAL: Patient is alert awake and oriented. Not in obvious distress. HENT: No scleral pallor or icterus. Pupils equally reactive to light. Oral mucosa is moist.  NG tube in place NECK: is supple, no gross swelling noted. CHEST: Clear to auscultation. No crackles or wheezes.  Diminished breath sounds bilaterally. CVS: S1 and S2 heard, no murmur. Regular rate and rhythm.  ABDOMEN: Soft, mildly distended abdomen, tenderness over the Right lower abdomen, EXTREMITIES: No edema. CNS: Cranial nerves are intact. No focal motor deficits. SKIN: warm and dry without rashes.  Data Review: I have personally reviewed the following laboratory data and studies,  CBC: Recent Labs  Lab 08/26/19 1804 08/29/19 0057 08/30/19 0247  WBC 7.2 6.4 9.6  HGB 14.3 11.9* 12.6*  HCT 42.8 35.5* 38.1*  MCV 88.6 91.0 90.9  PLT 471* 281 573   Basic Metabolic Panel: Recent Labs  Lab 08/26/19 1804 08/28/19 0200 08/29/19 0057 08/30/19 0247  NA 135 138 137 136  K 4.6 4.4 3.6 3.8  CL 104 106 106 105  CO2 20* 23 21* 19*  GLUCOSE 129* 89 63* 59*  BUN 11 18 7* 8  CREATININE 1.05 1.39* 1.14 1.12  CALCIUM 8.8* 8.4* 7.9* 8.0*  MG  --   --  1.4* 1.5*  PHOS  --   --  2.8 2.3*   Liver Function Tests: Recent Labs  Lab 08/28/19 0200 08/29/19 0057 08/30/19 0247  AST 58* 29 20  ALT 24 19  16   ALKPHOS 87 84 87  BILITOT 0.4 0.6 1.1  PROT 5.3* 5.0* 5.3*  ALBUMIN 2.3* 2.2* 2.2*   No results for input(s): LIPASE, AMYLASE in the last 168 hours. No results for input(s): AMMONIA in the last 168 hours. Cardiac Enzymes: No results for input(s): CKTOTAL, CKMB, CKMBINDEX, TROPONINI in the last 168 hours. BNP (last 3 results) Recent Labs    07/25/19 0246 07/26/19 0226  BNP 54.0 95.9    ProBNP (last 3 results) No results for input(s): PROBNP in the last 8760 hours.  CBG: Recent Labs  Lab 08/26/19 1814 08/27/19 1231 08/30/19 0822 08/30/19 0848  GLUCAP 123* 126* 53* 193*   Recent Results (from the past 240 hour(s))  SARS Coronavirus 2 by RT PCR (hospital order, performed  in [place] lab) Nasopharyngeal Nasopharyngeal Swab     Status: None   Collection Time: 08/27/19 11:12 AM   Specimen: Nasopharyngeal Swab  Result Value Ref Range Status   SARS Coronavirus 2 NEGATIVE NEGATIVE Final    Comment: (NOTE) SARS-CoV-2 target nucleic acids are NOT DETECTED.  The SARS-CoV-2 RNA is generally detectable in upper and lower respiratory specimens during the acute phase of infection. The lowest concentration of SARS-CoV-2 viral copies this assay can detect is 250 copies / mL. A negative result does not preclude SARS-CoV-2 infection and should not be used as the sole basis for treatment or other patient management decisions.  A negative result may occur with improper specimen collection / handling, submission of specimen other than nasopharyngeal swab, presence of viral mutation(s) within the areas targeted by this assay, and inadequate number of viral copies (<250 copies / mL). A negative result must be combined with clinical observations, patient history, and epidemiological information.  Fact Sheet for Patients:   StrictlyIdeas.no  Fact Sheet for Healthcare Providers: BankingDealers.co.za  This test is not yet  approved or  cleared by the [place] FDA and has been authorized for detection and/or diagnosis of SARS-CoV-2 by FDA under an Emergency Use Authorization (EUA).  This EUA will remain in effect (meaning this test can be used) for the duration of the COVID-19 declaration under Section 564(b)(1) of the Act, 21 U.S.C. section 360bbb-3(b)(1), unless the authorization is terminated or revoked sooner.  Performed at [facility], [place] [address], [place], [place] [address]      Studies: DG BE (COLON)W SINGLE CM (SOL OR THIN BA)  Result Date: 08/29/2019 CLINICAL DATA:  Sigmoid colon wall thickening with possible bowel obstruction EXAM: WATER SOLUBLE CONTRAST ENEMA TECHNIQUE: Water-soluble contrast enema performed using 450 cc of Omnipaque 300 diluted in 1500 mL of water. Contrast medium was instilled under slow low gravity conditions. FLUOROSCOPY TIME:  Fluoroscopy Time:  3 minutes, 30 seconds Radiation Exposure Index (if provided by the fluoroscopic device): 131.7 Number of Acquired Spot Images: 7 COMPARISON:  Multiple exams, including CT abdomen from 08/27/2019 FINDINGS: Initial KUB demonstrates gaseous prominence of small bowel and large bowel suspicious for ileus or bowel obstruction. A nasogastric tube is present in the stomach body. Initially the rectum was filled without insufflation of the retention balloon. For some of the later images, I did inflate the retention balloon due to partial lack of containment of the enema. On series 6, contrast is noted to extend through a very thin, irregular lumen of the sigmoid colon along a 6.1 cm sharply defined segment as shown on the recent CT examination. However, contrast also extends from the lumen of this segment into a longitudinally oriented collection which extends partially along the irregular segment of sigmoid colon, partially just below dilated segment of colon extending to this irregular narrowed segment. This is probably tracking  longitudinally in the colon wall (for example, see image 18/11) but could conceivably represent a small contained collection or even a colocolic fistula. The connection of this collection to the narrowed lumen is shown on image 6/7. The relationship of the narrowed luminal channel intramural or contained collection of contrast along the inferior colon margin is also shown on image 2/20. Because of the sluggish nature of contrast extension into the dilated segment, the more proximal colon was not filled. IMPRESSION: 1. 6.1 cm markedly thinned and irregular sigmoid colon lumen, especially along a approximately 3 cm segment immediately adjacent to the dilated descending  colon. Given the irregularity of the lumen, and the presence of diverticula, this could well be from severe diverticulosis related wall thickening, possibly with some active diverticulitis; based purely on imaging, exclusion of tumor in this vicinity is not readily feasible. I was able to slowly pass contrast through this narrowed lumen in a retrograde manner, but given how narrow this segment is, it may well be acting to cause partial obstruction. 2. Connected to this irregular and thinned lumen, there a 5 cm in length longitudinal collection of contrast tracking either within the inferior wall of the involved segment, or representing a linear, contained perforation tracking in a along the margin of the wall of the colon. Although outside of the colon lumen, this does not spread freely in the abdomen despite turning and tilting the patient. Electronically Signed   By: Van Clines M.D.   On: 08/29/2019 16:30      Flora Lipps, MD  [facility] 08/30/2019

## 2019-08-30 NOTE — Op Note (Signed)
Operative Note  Roy [name]  [id number]  [id number]  08/30/2019   Surgeon: Victorino Sparrow ConnorMD  Assistant: Margie Billet PA-C  Procedure performed: sigmoid colectomy with end colostomy  Preop diagnosis: large bowel obstruction Post-op diagnosis/intraop findings: same  Specimens: sigmoid Retained items: no EBL: 50KX Complications: none  Description of procedure: After obtaining informed consent the patient was taken to the operating room and placed supine on operating room table wheregeneral endotracheal anesthesia was initiated, preoperative antibiotics were administered, SCDs applied, and a formal timeout was performed. A foley catheter was inserted. The abdomen was prepped and draped in the usual sterile fashion and a lower midline laparotomy was created. Upon entry there was small volume clear ascites which was aspirated. The entirety of the small and large bowel were significantly dilated.  The incision was completed and omental adhesions to the rectosigmoid area were lysed with cautery so that the omentum could be reflected cephalad and the small bowel could be packed at the in the upper abdomen.  A Balfour retractor was placed.    There was a inflammatory mass in the distal sigmoid which was densely adherent to the lateral sidewall.  Finger fracture blunt dissection and scant cautery were used to mobilize the segment of the colon.  The lateral peritoneal reflection was also divided above and below this area so that the sigmoid colon could be medialized into the field.  We were able to stay out of the retroperitoneum in this process.  A healthy-appearing rectum was identified and transected with a contour green load.  The rectal stump was marked with a 2-0 Prolene on the left lateral corner.  The LigaSure was then used to divide the mesentery, staying close to the colon until we reached a point of uninflamed but severely dilated descending colon.  Additional figure-of-eight  sutures of 2-0 silk were required for hemostasis along the mesentery.  The descending colon was mobilized until it would easily reach the anterior abdominal wall to form a colostomy.  The splenic flexure was not manipulated.  A colostomy site was created in the left midabdomen and dilated to accommodate 3 fingerbreadths.  The staple line of the descending colon was brought out through this and secured with a Babcock.  At this point the abdomen was irrigated with warm sterile saline and hemostasis confirmed.  The small bowel was run and confirmed to be free of injury although noted to be diffusely dilated.  The small bowel was placed back in the abdomen confirming no twisting in the mesentery, and the omentum was brought back down over the small bowel though did not quite reach down into the pelvis to cover the rectal stump.  The fascia was closed with running looped #1 PDS starting at either end and tying centrally.  The soft tissues were irrigated and hemostasis confirmed, and then the skin was closed with staples.  The staple line was excised from the ostomy which was then matured in a standard fashion with interrupted 3-0 Vicryls.  The mucosa appears viable and well perfused.  The stoma is palpable through the fascia by digital palpation and we were able to aspirate a fair amount of liquid stool through the stoma.  A sterile dressing was placed on the incision and an ostomy appliance placed on the stoma.  The patient was then awakened, extubated and taken to PACU in stable condition.   All counts were correct at the completion of the case.

## 2019-08-30 NOTE — Transfer of Care (Signed)
Immediate Anesthesia Transfer of Care Note  Patient: Roy [name]  Procedure(s) Performed: SIGMOID COLON RESECTION (N/A Abdomen) COLOSTOMY (N/A Abdomen)  Patient Location: PACU  Anesthesia Type:General  Level of Consciousness: awake, alert  and oriented  Airway & Oxygen Therapy: Patient Spontanous Breathing and Patient connected to face mask oxygen  Post-op Assessment: Report given to RN, Post -op Vital signs reviewed and stable and Patient moving all extremities X 4  Post vital signs: Reviewed and stable  Last Vitals:  Vitals Value Taken Time  BP 126/82 08/30/19 1151  Temp    Pulse 78 08/30/19 1152  Resp 11 08/30/19 1152  SpO2 100 % 08/30/19 1152  Vitals shown include unvalidated device data.  Last Pain:  Vitals:   08/30/19 0801  TempSrc: Oral  PainSc:          Complications: No complications documented.

## 2019-08-30 NOTE — Progress Notes (Addendum)
Yesterday's BE revealed sigmoid lumen narrowing and irregularity, ? Severe diverticulitis? Also seen in the region was area of collected contrast, ? Contained perf?  Note pt went to surgery this AM w sigmoid colectomy and end colostomy for bowel obstruction.   Surgeon notes inflammatory mass at distal sigmoid, densely adherent to lateral sidewall.    Await surgical pathology.    Azucena Freed PA-C.     GI will be on the sideline at this time.  Please let us know what the final pathology shows should patient require further Inpatient GI workup or if outpatient GI follow up is needed.  We will signoff for now.  Justice Britain, MD [facility] Advanced [facility]oscopy Office # [phone]

## 2019-08-30 NOTE — Progress Notes (Addendum)
I agree with the following treatment note after review of the documentation. This session was performed under the supervision of a licensed clinician.   Leighton Ruff, PT, DPT  Acute Rehabilitation Services  Pager: [phone]    PT Cancellation Note  Patient Details Name: ARMOND CUTHRELL MRN: [id number] DOB: [birth year]   Cancelled Treatment:    Reason Eval/Treat Not Completed: Pain limiting ability to participate Pt just recently returned from surgery and in increased pain. Wanting to hold on PT. Will follow up as schedule allows.   Gloriann Loan, SPT  Acute Rehabilitation Services  Office: [phone]  08/30/2019, 4:32 PM

## 2019-08-30 NOTE — Anesthesia Preprocedure Evaluation (Addendum)
Anesthesia Evaluation  Patient identified by MRN, date of birth, ID band Patient awake    Reviewed: Allergy & Precautions, NPO status , Patient's Chart, lab work & pertinent test results  History of Anesthesia Complications Negative for: history of anesthetic complications  Airway Mallampati: II  TM Distance: >3 FB Neck ROM: Full    Dental  (+) Dental Advisory Given, Teeth Intact   Pulmonary sleep apnea , former smoker,    Pulmonary exam normal        Cardiovascular hypertension, Pt. on medications and Pt. on home beta blockers + CAD and + Cardiac Stents  Normal cardiovascular exam   '21 TTE - EF 70 to 75%. Grade I diastolic dysfunction (impaired relaxation). Trivial mitral valve regurgitation.  '18 Myoview - The patient walked for 6:00 of a Bruce protocol. Peak HR of 157 which is 104% predicted maximal HR . There were no ST or T wave changes to suggest ischemia. Defect 1: There is a medium defect of moderate severity present in the basal inferior, basal inferolateral, mid inferior, mid inferolateral and apical inferior location. 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 Nuclear stress EF: 58%. The left ventricular ejection fraction is normal (55-65%). This is a low risk study.     Neuro/Psych PSYCHIATRIC DISORDERS Depression  Vertigo     GI/Hepatic Neg liver ROS, GERD  Medicated and Controlled, Large bowel obstruction NGT in place    Endo/Other   Pre-DM   Renal/GU negative Renal ROS     Musculoskeletal negative musculoskeletal ROS (+)   Abdominal   Peds  Hematology  (+) anemia ,   Anesthesia Other Findings Covid test negative   Reproductive/Obstetrics                            Anesthesia Physical Anesthesia Plan  ASA: III  Anesthesia Plan: General   Post-op Pain Management:    Induction: Intravenous and Rapid sequence  PONV Risk  Score and Plan: 2 and Treatment may vary due to age or medical condition, Ondansetron and Dexamethasone  Airway Management Planned: Oral ETT  Additional Equipment: None  Intra-op Plan:   Post-operative Plan: Extubation in OR  Informed Consent: I have reviewed the patients History and Physical, chart, labs and discussed the procedure including the risks, benefits and alternatives for the proposed anesthesia with the patient or authorized representative who has indicated his/her understanding and acceptance.   Patient has DNR.  Discussed DNR with patient and Suspend DNR.   Dental advisory given  Plan Discussed with: CRNA and Anesthesiologist  Anesthesia Plan Comments:        Anesthesia Quick Evaluation

## 2019-08-31 ENCOUNTER — Encounter (HOSPITAL_COMMUNITY): Payer: Self-pay | Admitting: Surgery

## 2019-08-31 LAB — COMPREHENSIVE METABOLIC PANEL
ALT: 11 U/L (ref 0–44)
AST: 15 U/L (ref 15–41)
Albumin: 1.8 g/dL — ABNORMAL LOW (ref 3.5–5.0)
Alkaline Phosphatase: 69 U/L (ref 38–126)
Anion gap: 10 (ref 5–15)
BUN: 5 mg/dL — ABNORMAL LOW (ref 8–23)
CO2: 20 mmol/L — ABNORMAL LOW (ref 22–32)
Calcium: 7.8 mg/dL — ABNORMAL LOW (ref 8.9–10.3)
Chloride: 104 mmol/L (ref 98–111)
Creatinine, Ser: 1.04 mg/dL (ref 0.61–1.24)
GFR calc Af Amer: >60 mL/min (ref >60)
GFR calc non Af Amer: >60 mL/min (ref >60)
Glucose, Bld: 86 mg/dL (ref 70–99)
Potassium: 4.4 mmol/L (ref 3.5–5.1)
Sodium: 134 mmol/L — ABNORMAL LOW (ref 135–145)
Total Bilirubin: 1.1 mg/dL (ref 0.3–1.2)
Total Protein: 4.4 g/dL — ABNORMAL LOW (ref 6.5–8.1)

## 2019-08-31 LAB — CBC
HCT: 33 % — ABNORMAL LOW (ref 39.0–52.0)
Hemoglobin: 11 g/dL — ABNORMAL LOW (ref 13.0–17.0)
MCH: 29.8 pg (ref 26.0–34.0)
MCHC: 33.3 g/dL (ref 30.0–36.0)
MCV: 89.4 fL (ref 80.0–100.0)
Platelets: 307 10*3/uL (ref 150–400)
RBC: 3.69 MIL/uL — ABNORMAL LOW (ref 4.22–5.81)
RDW: 15 % (ref 11.5–15.5)
WBC: 10.8 10*3/uL — ABNORMAL HIGH (ref 4.0–10.5)
nRBC: 0 % (ref 0.0–0.2)

## 2019-08-31 LAB — MAGNESIUM: Magnesium: 1.3 mg/dL — ABNORMAL LOW (ref 1.7–2.4)

## 2019-08-31 LAB — PHOSPHORUS: Phosphorus: 3.4 mg/dL (ref 2.5–4.6)

## 2019-08-31 MED ORDER — HYDROMORPHONE HCL 1 MG/ML IJ SOLN: 0.5000 mg | INTRAMUSCULAR | Status: DC | PRN

## 2019-08-31 MED ORDER — ACETAMINOPHEN 325 MG PO TABS
650.0000 mg | ORAL_TABLET | Freq: Four times a day (QID) | ORAL | Status: DC
  Administered 2019-08-31 – 2019-09-06 (×25): 650 mg via ORAL
  Filled 2019-08-31 (×25): qty 2

## 2019-08-31 MED ORDER — MAGNESIUM SULFATE 4 GM/100ML IV SOLN
4.0000 g | Freq: Once | INTRAVENOUS | Status: AC
  Administered 2019-08-31: 4 g via INTRAVENOUS
  Filled 2019-08-31: qty 100

## 2019-08-31 MED ORDER — ENOXAPARIN SODIUM 30 MG/0.3ML ~~LOC~~ SOLN
30.0000 mg | Freq: Two times a day (BID) | SUBCUTANEOUS | Status: DC
  Administered 2019-08-31 – 2019-09-03 (×8): 30 mg via SUBCUTANEOUS
  Filled 2019-08-31 (×9): qty 0.3

## 2019-08-31 MED ORDER — GABAPENTIN 100 MG PO CAPS
100.0000 mg | ORAL_CAPSULE | Freq: Three times a day (TID) | ORAL | Status: DC
  Administered 2019-08-31 – 2019-09-06 (×20): 100 mg via ORAL
  Filled 2019-08-31 (×20): qty 1

## 2019-08-31 MED ORDER — OXYCODONE HCL 5 MG PO TABS
5.0000 mg | ORAL_TABLET | Freq: Four times a day (QID) | ORAL | Status: DC | PRN
  Administered 2019-08-31: 5 mg via ORAL
  Filled 2019-08-31 (×3): qty 1

## 2019-08-31 MED ORDER — MAGNESIUM SULFATE 4 GM/100ML IV SOLN
4.0000 g | Freq: Once | INTRAVENOUS | Status: DC
  Filled 2019-08-31: qty 100

## 2019-08-31 MED ORDER — MAGNESIUM SULFATE 2 GM/50ML IV SOLN: 2.0000 g | Freq: Once | INTRAVENOUS | Status: DC

## 2019-08-31 NOTE — Consult Note (Signed)
[facility] Nurse ostomy consult note Stoma type/location: LLQ colosotmy Stomal assessment/size: Viewed through intact pouch: 2 inches, red, moist, functioning, raised and edematous Peristomal assessment: Not seen today Treatment options for stomal/peristomal skin: None Output: liquid effluent and flatus Ostomy pouching: 2pc. 2 and 3/4 inch pouching system in place and same is ordered for use in tomorrow's teaching session.  Five (5) pouches, 5 skin barriers and 5 skin barrier rings requested of Unit Secretary. Further, I requested that once supplies arrive, that they either be given to the patient's RN or be delivered to the patient's room. Education provided: Patient is POD 1 and still has NGT in, albeit clamped. He is getting OOB to chair.  The surgical honeycomb dressing applied in surgery is intact and the ostomy pouching system is affixed to it. Plan to leave in place until tomorrow to gain maximum effect from honeycomb dressing since it would be disturbed for first pouch change. Patient taught the role of the ostomy RN and that we would return tomorrow for the initial pouch change and first teaching session. He is amenable to that. One of my partners will see in my absence. Enrolled patient in [place] program: No  WOC nursing team will follow, and will remain available to this patient, the nursing, surgical and medical teams.   Thanks, Maudie Flakes, MSN, RN, [place], Ar[facility] Abbott  Pager# [phone]

## 2019-08-31 NOTE — Anesthesia Postprocedure Evaluation (Signed)
Anesthesia Post Note  Patient: Roy [name]  Procedure(s) Performed: SIGMOID COLON RESECTION (N/A Abdomen) COLOSTOMY (N/A Abdomen)     Patient location during evaluation: PACU Anesthesia Type: General Level of consciousness: awake and alert Pain management: pain level controlled Vital Signs Assessment: post-procedure vital signs reviewed and stable Respiratory status: spontaneous breathing, nonlabored ventilation and respiratory function stable Cardiovascular status: blood pressure returned to baseline and stable Postop Assessment: no apparent nausea or vomiting Anesthetic complications: no   No complications documented.  Last Vitals:  Vitals:   08/31/19 0420 08/31/19 0737  BP: 115/85 119/84  Pulse: 84 78  Resp: 14 17  Temp: (!) 36.3 C 36.5 C  SpO2: 97% 98%    Last Pain:  Vitals:   08/31/19 0900  TempSrc:   PainSc: 0-No pain                 Audry Pili

## 2019-08-31 NOTE — Evaluation (Signed)
Physical Therapy Evaluation Patient Details Name: Roy [name] MRN: [id number] DOB: 08-15-1945 Today's Date: 08/31/2019   History of Present Illness  Pt is a 74 y.o. male admitted 08/26/19 with abdominal pain. Imaging suggestive of diverticulitis with a bowel obstruction. 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 PMH includes HTN, CAD, depression. Of note, pt with a couple recent admissions with similar symptoms.    Clinical Impression  Pt presents with an overall decrease in functional mobility secondary to above. PTA, pt independent, lives alone, drives. Initiated education on colostomy bag, abdominal precautions for comfort, importance of mobility, activity recomendations. Today, pt able to initiate OOB mobility well, although limited by pain; stability improved with RW. Pt would benefit from continued acute PT services to maximize functional mobility and independence prior to d/c home.     Follow Up Recommendations No PT follow up    Equipment Recommendations  None recommended by PT    Recommendations for Other Services       Precautions / Restrictions Precautions Precautions: Fall;Other (comment) Precaution Comments: abdominal for comfort, colostomy, NGT Restrictions Weight Bearing Restrictions: No      Mobility  Bed Mobility Overal bed mobility: Modified Independent             General bed mobility comments: Increased time and effort; cues for log roll technique, pt limited by pain but able to perform with hand rail  Transfers Overall transfer level: Needs assistance Equipment used: Rolling walker (2 wheeled);None Transfers: Sit to/from Stand Sit to Stand: Min guard;Supervision         General transfer comment: Able to stand from bed without DME, reaching for HHA for stability upon standing; supervision standing from recliner with RW  Ambulation/Gait Ambulation/Gait assistance: Min guard;Supervision Gait Distance (Feet): 40 Feet Assistive device:  None;Rolling walker (2 wheeled) Gait Pattern/deviations: Step-through pattern;Decreased stride length;Antalgic Gait velocity: Decreased   General Gait Details: Initial min guard taking steps to recliner without DME; supervision ambulating in room with RW, stability improved. Pt declined additional distance secondary to pain  Stairs            Wheelchair Mobility    Modified Rankin (Stroke Patients Only)       Balance Overall balance assessment: Mild deficits observed, not formally tested                                           Pertinent Vitals/Pain Pain Assessment: Faces Faces Pain Scale: Hurts even more Pain Location: Abdomen Pain Descriptors / Indicators: Discomfort;Grimacing;Guarding;Moaning Pain Intervention(s): Monitored during session;Patient requesting pain meds-RN notified    Home Living Family/patient expects to be discharged to:: Private residence Living Arrangements: Alone   Type of Home: House Home Access: Stairs to enter Entrance Stairs-Rails: Psychiatric nurse of Steps: [place]: One level Home Equipment: None Additional Comments: Has access to RW    Prior Function Level of Independence: Independent         Comments: 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        Extremity/Trunk Assessment   Upper Extremity Assessment Upper Extremity Assessment: Overall WFL for tasks assessed    Lower Extremity Assessment Lower Extremity Assessment: Overall WFL for tasks assessed       Communication   Communication: No difficulties  Cognition Arousal/Alertness: Awake/alert Behavior During Therapy: WFL for tasks assessed/performed Overall  Cognitive Status: Within Functional Limits for tasks assessed                                        General Comments General comments (skin integrity, edema, etc.): Initiated basic colostomy education - including  checking how full bag is prior to mobility, abdominal precautions for comfort    Exercises     Assessment/Plan    PT Assessment Patient needs continued PT services  PT Problem List Decreased activity tolerance;Decreased balance;Decreased mobility;Decreased knowledge of precautions;Decreased knowledge of use of DME       PT Treatment Interventions DME instruction;Gait training;Stair training;Functional mobility training;Therapeutic activities;Therapeutic exercise;Balance training;Patient/family education    PT Goals (Current goals can be found in the Care Plan section)  Acute Rehab PT Goals Patient Stated Goal: Decreased pain PT Goal Formulation: With patient Time For Goal Achievement: 09/14/19 Potential to Achieve Goals: Good    Frequency Min 3X/week   Barriers to discharge Decreased caregiver support      Co-evaluation               AM-PAC PT "6 Clicks" Mobility  Outcome Measure Help needed turning from your back to your side while in a flat bed without using bedrails?: None Help needed moving from lying on your back to sitting on the side of a flat bed without using bedrails?: None Help needed moving to and from a bed to a chair (including a wheelchair)?: A Little Help needed standing up from a chair using your arms (e.g., wheelchair or bedside chair)?: A Little Help needed to walk in hospital room?: A Little Help needed climbing 3-5 steps with a railing? : A Little 6 Click Score: 20    End of Session   Activity Tolerance: Patient tolerated treatment well Patient left: in chair;with call bell/phone within reach;with nursing/sitter in room Nurse Communication: Mobility status PT Visit Diagnosis: Other abnormalities of gait and mobility (R26.89);Pain    Time: 7654-6503 PT Time Calculation (min) (ACUTE ONLY): 24 min   Charges:   PT Evaluation $PT Eval Moderate Complexity: 1 Mod PT Treatments $Therapeutic Activity: 8-22 mins      Mabeline Caras, PT,  DPT Acute Rehabilitation Services  Pager [phone] Office [place] 08/31/2019, 10:01 AM

## 2019-08-31 NOTE — TOC Initial Note (Signed)
Transition of Care [facility]) - Initial/Assessment Note    Patient Details  Name: Roy [name] MRN: [id number] Date of Birth: [birth year]  [facility] CM/SW Contact:    Curlene Labrum, RN Phone Number: 08/31/2019, 4:15 PM  Clinical Narrative:                 Case Management met with the patient at the bedside - S/P sigmoid colectomy and end colostomy by Dr. Windle Guard.  The patient lives alone and has some assistance from ex-wife and neighbors.  He is open to home health services if able to find [facility] for RN, PT and aide.  The patient states that he may be discharging home this weekend.  I called Amy with - [facility] - waiting on answer for RN, PT, aide for [facility].  Will need colostomy supplies from wound care nurse after consult.  The patient plans to get a ride home with his ex-wife - [name].  Will continue to follow to obtain home health support and needed dme - including RW and 3:1.    Barriers to Discharge: No Barriers Identified, Continued Medical Work up   Patient Goals and CMS Choice Patient states their goals for this hospitalization and ongoing recovery are:: Patient plans to discharge to home with home health - lives alone. CMS Medicare.gov Compare Post Acute Care list provided to:: Patient Choice offered to / list presented to : Patient  Expected Discharge Plan and Services     Discharge Planning Services: CM Consult Post Acute Care Choice: [place] arrangements for the past 2 months: Single Family Home                               Date [place]: 08/31/19 Time [place]: [phone] Representative spoke with at [place]: [place] - [facility] - waiting for return call  Prior Living Arrangements/Services Living arrangements for the past 2 months: Single Family Home Lives with:: Self Patient language and need for interpreter reviewed:: Yes Do you feel safe going back to the place where you live?: Yes      Need for Family  Participation in Patient Care: Yes (Comment) Care giver support system in place?: Yes (comment)   Criminal Activity/Legal Involvement Pertinent to Current Situation/Hospitalization: No - Comment as needed  Activities of Daily Living Home Assistive Devices/Equipment: None ADL Screening (condition at time of admission) Patient's cognitive ability adequate to safely complete daily activities?: Yes Is the patient deaf or have difficulty hearing?: Yes Does the patient have difficulty seeing, even when wearing glasses/contacts?: Yes Does the patient have difficulty concentrating, remembering, or making decisions?: No Patient able to express need for assistance with ADLs?: Yes Does the patient have difficulty dressing or bathing?: No Independently performs ADLs?: Yes (appropriate for developmental age) Communication: Independent Dressing (OT): Independent Grooming: Independent Feeding: Independent Bathing: Independent Toileting: Independent In/Out Bed: Independent Walks in Home: Independent Does the patient have difficulty walking or climbing stairs?: No Weakness of Legs: Both Weakness of Arms/Hands: None  Permission Sought/Granted Permission sought to share information with : Case Manager Permission granted to share information with : Yes, Verbal Permission Granted     Permission granted to share info w AGENCY: Home health agencies  Permission granted to share info w Relationship: family     Emotional Assessment Appearance:: Appears stated age Attitude/Demeanor/Rapport: Gracious Affect (typically observed): Accepting Orientation: : Oriented to Self, Oriented to Place, Oriented to  Time, Oriented to Situation Alcohol / Substance Use: Not Applicable Psych Involvement: No (comment)  Admission diagnosis:  SBO (small bowel obstruction) ([place]) [K56.609] Encounter for imaging study to confirm nasogastric (NG) tube placement [Z01.89] Abdominal pain [R10.9] Chest pain  [R07.9] Intestinal obstruction, unspecified cause, unspecified whether partial or complete [facility]) [K56.609] Patient Active Problem List   Diagnosis Date Noted  . SBO (small bowel obstruction) ([place]) 08/27/2019  . Diverticulitis 08/27/2019  . 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 Lactic acid acidosis 08/10/2019  . Hypokalemia 08/10/2019  . Colitis, acute 07/29/2019  . Abnormal CT scan, colon   . Protein-calorie malnutrition, severe 07/25/2019  . Large bowel obstruction ([place]) 07/23/2019  . Acute renal failure ([place]) 07/23/2019  . Controlled type 2 diabetes mellitus with hyperglycemia ([place]) 07/23/2019  . Insomnia 07/23/2019  . Hypotension 07/01/2019  . Personal history of multiple sclerosis ([place]) 12/02/2018  . Chronic fatigue 03/31/2017  . Acute constipation 05/25/2015  . Internal hemorrhoids with complication 13/64/3837  . CAD, RCA PCI 2001, urgent Dx2 DES 04/24/14 04/24/2014  . Unstable angina with ST elelvation and NSVT while on treadmill 04/24/14   . History of basal cell carcinoma 03/07/2014  . GERD (gastroesophageal reflux disease) 03/07/2014  . Incomplete emptying of bladder 03/07/2014  . Obstructive sleep apnea 08/20/2009  . Dyslipidemia 11/09/2008  . Depression, major, recurrent ([place]) 07/06/2007  . Essential hypertension 07/06/2007  . Coronary artery disease involving native coronary artery of native heart without angina pectoris 07/06/2007   PCP:  Jinny Sanders, MD Pharmacy:   [id number] - , [address] [place] [address] Phone: [phone] Fax: [phone]     Social Determinants of Health (SDOH) Interventions    Readmission Risk Interventions Readmission Risk Prevention Plan 08/31/2019  Transportation Screening Complete  PCP or Specialist Appt within 5-7 Days Complete  Home Care Screening Complete  Medication Review (RN CM) Complete  Some recent data might be hidden

## 2019-08-31 NOTE — Progress Notes (Addendum)
1 Day Post-Op  Subjective: Pain well controlled.    ROS: See above, otherwise other systems negative  Objective: Vital signs in last 24 hours: Temp:  [97.4 F (36.3 C)-98 F (36.7 C)] 97.7 F (36.5 C) (08/18 0737) Pulse Rate:  [77-96] 78 (08/18 0737) Resp:  [10-17] 17 (08/18 0737) BP: (115-159)/(82-98) 119/84 (08/18 0737) SpO2:  [97 %-100 %] 98 % (08/18 0737) Last BM Date: 08/30/19  Intake/Output from previous day: 08/17 0701 - 08/18 0700 In: 1400 [I.V.:1400] Out: 1100 [Urine:775; Stool:275; Blood:50] Intake/Output this shift: No intake/output data recorded.  PE: Heart: regular Lungs: CTAB Abd: soft, minimally distended, appropriately tender. Incision c/d/i under honeycomb, stoma is pink, everted and productive with gas and liquid stool. NG with thin clear fluid in cannister.   Lab Results:  Recent Labs    08/30/19 0247 08/31/19 0312  WBC 9.6 10.8*  HGB 12.6* 11.0*  HCT 38.1* 33.0*  PLT 303 307   BMET Recent Labs    08/30/19 0247 08/31/19 0312  NA 136 134*  K 3.8 4.4  CL 105 104  CO2 19* 20*  GLUCOSE 59* 86  BUN 8 5*  CREATININE 1.12 1.04  CALCIUM 8.0* 7.8*   PT/INR No results for input(s): LABPROT, INR in the last 72 hours. CMP     Component Value Date/Time   NA 134 (L) 08/31/2019 0312   K 4.4 08/31/2019 0312   CL 104 08/31/2019 0312   CO2 20 (L) 08/31/2019 0312   GLUCOSE 86 08/31/2019 0312   GLUCOSE 118 (H) 12/08/2005 0741   BUN 5 (L) 08/31/2019 0312   CREATININE 1.04 08/31/2019 0312   CREATININE 1.11 04/02/2015 1514   CALCIUM 7.8 (L) 08/31/2019 0312   PROT 4.4 (L) 08/31/2019 0312   PROT 6.6 03/26/2016 0736   ALBUMIN 1.8 (L) 08/31/2019 0312   ALBUMIN 4.0 03/26/2016 0736   AST 15 08/31/2019 0312   ALT 11 08/31/2019 0312   ALKPHOS 69 08/31/2019 0312   BILITOT 1.1 08/31/2019 0312   BILITOT 0.3 03/26/2016 0736   GFRNONAA >60 08/31/2019 0312   GFRAA >60 08/31/2019 0312   Lipase     Component Value Date/Time   LIPASE 18 08/10/2019  1936       Studies/Results: DG BE (COLON)W SINGLE CM (SOL OR THIN BA)  Result Date: 08/29/2019 CLINICAL DATA:  Sigmoid colon wall thickening with possible bowel obstruction EXAM: WATER SOLUBLE CONTRAST ENEMA TECHNIQUE: Water-soluble contrast enema performed using 450 cc of Omnipaque 300 diluted in 1500 mL of water. Contrast medium was instilled under slow low gravity conditions. FLUOROSCOPY TIME:  Fluoroscopy Time:  3 minutes, 30 seconds Radiation Exposure Index (if provided by the fluoroscopic device): 131.7 Number of Acquired Spot Images: 7 COMPARISON:  Multiple exams, including CT abdomen from 08/27/2019 FINDINGS: Initial KUB demonstrates gaseous prominence of small bowel and large bowel suspicious for ileus or bowel obstruction. A nasogastric tube is present in the stomach body. Initially the rectum was filled without insufflation of the retention balloon. For some of the later images, I did inflate the retention balloon due to partial lack of containment of the enema. On series 6, contrast is noted to extend through a very thin, irregular lumen of the sigmoid colon along a 6.1 cm sharply defined segment as shown on the recent CT examination. However, contrast also extends from the lumen of this segment into a longitudinally oriented collection which extends partially along the irregular segment of sigmoid colon, partially just below dilated segment of colon extending  to this irregular narrowed segment. This is probably tracking longitudinally in the colon wall (for example, see image 18/11) but could conceivably represent a small contained collection or even a colocolic fistula. The connection of this collection to the narrowed lumen is shown on image 6/7. The relationship of the narrowed luminal channel intramural or contained collection of contrast along the inferior colon margin is also shown on image 2/20. Because of the sluggish nature of contrast extension into the dilated segment, the more  proximal colon was not filled. IMPRESSION: 1. 6.1 cm markedly thinned and irregular sigmoid colon lumen, especially along a approximately 3 cm segment immediately adjacent to the dilated descending colon. Given the irregularity of the lumen, and the presence of diverticula, this could well be from severe diverticulosis related wall thickening, possibly with some active diverticulitis; based purely on imaging, exclusion of tumor in this vicinity is not readily feasible. I was able to slowly pass contrast through this narrowed lumen in a retrograde manner, but given how narrow this segment is, it may well be acting to cause partial obstruction. 2. Connected to this irregular and thinned lumen, there a 5 cm in length longitudinal collection of contrast tracking either within the inferior wall of the involved segment, or representing a linear, contained perforation tracking in a along the margin of the wall of the colon. Although outside of the colon lumen, this does not spread freely in the abdomen despite turning and tilting the patient. Electronically Signed   By: Van Clines M.D.   On: 08/29/2019 16:30    Anti-infectives: Anti-infectives (From admission, onward)   Start     Dose/Rate Route Frequency Ordered Stop   08/27/19 2100  piperacillin-tazobactam (ZOSYN) IVPB 3.375 g     Discontinue    "Followed by" Linked Group Details   3.375 g 12.5 mL/hr over 240 Minutes Intravenous Every 8 hours 08/27/19 1448     08/27/19 1500  piperacillin-tazobactam (ZOSYN) IVPB 3.375 g       "Followed by" Linked Group Details   3.375 g 100 mL/hr over 30 Minutes Intravenous  Once 08/27/19 1448 08/27/19 1752       Assessment/Plan HTN HLD CADs/p PCI 2001 and 2016- hold plavix.  ECHO7/12 shows EF 70-75%. Cardiology reports patient is moderate cardiovascular risk DM - A1c 6.2 OSA AKI -resolved, IVFs  LBO - mass vs colitis - patient returns with similar symptoms noted previously - prealbumin 15 - BE  confirms narrowed irregular segment with possible contained perforation - s/p hartmans 08/30/19 Dr. Kae Heller  FEN: NPO, ice chips, IVF VTE: SCDs, ok to have lovenox from a surgical standpoint  ID: [id number] 8/14>>  Plan: remove foley. Clamp NG tube. Multimodal pain regimen. Start lovenox prophylaxis. WOCN and TOC consults, PT/OT eval. Mobilize. Replace Mg IV.     LOS: 4 days    Roy [name] , [facility] 08/31/2019, 8:53 AM Please see Amion for pager number during day hours 7:00am-4:30pm or 7:00am -11:30am on weekends

## 2019-08-31 NOTE — Progress Notes (Signed)
Patient has no output for 2 hrs on intermittent suction. NG tube D/C per order.

## 2019-08-31 NOTE — Progress Notes (Signed)
PROGRESS NOTE  Roy [name] IYM:[id number] DOB: September 25, 1945 DOA: 08/26/2019 PCP: Jinny Sanders, MD   LOS: 4 days   Brief narrative: As per HPI,  Roy [name] is a 74 y.o. male with medical history significant for coronary artery disease status post stent angioplasty, hypertension, depression, obstructive sleep apnea  presented to the emergency room with complaints of abdominal pain intermittently for 1 month.  Patient was recently admitted to the hospital and at that time he had a colonoscopy and was told he had a bowel obstruction.  His GI pathogen panel was positive for Yersinia and he was treated with doxycycline.  Patient states that since his discharge he has continued to have intermittent periumbilical pain with worsening appetite and weight loss.  He did follow-up with GI after discharge.  In the ED, his labs revealed sodium level of 135, potassium of 4.6, bicarb of 20, lactic acid of 2, white count of 7.2, hemoglobin of 14.3. CT scan of abdomen pelvis showed findings most consistent with sigmoid diverticulitis resulting in obstruction of the proximal large and small bowel. The degree of obstruction has increased since 08/10/2019. Biopsy results from recent colonoscopy did not demonstrate malignancy or active inflammation.  Patient was then admitted to hospital for further evaluation and treatment.  Assessment/Plan:  Principal Problem:   Large bowel obstruction (HCC) Active Problems:   Depression, major, recurrent ([place])   Essential hypertension   Coronary artery disease involving native coronary artery of native heart without angina pectoris   Diverticulitis  Acute small and large bowel obstruction: Patient was initially on conservative treatment with NG tube, IV fluids n.p.o. status.    Patient had barium enema done which showed irregular narrowing at the sigmoid colon region.  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  Management deferred to general surgery.  Acute diverticulitis: He is now status post colectomy.  ?  Does he still need antibiotics .  Will defer to general surgery to address this  Essential hypertension: Controlled.  Continue IV hydralazine as needed while he remains n.p.o.  Resume Coreg when p.o.  Hypomagnesemia. Will replace IV, check in am.  Hypophosphatemia.  Resolved.  DVT prophylaxis: enoxaparin (LOVENOX) injection 30 mg Start: 08/31/19 0900 SCDs Start: 08/27/19 1433    Code Status: DNR  Family Communication: None present at bedside.  Plan of care discussed with patient.  Status is: Inpatient  Remains inpatient appropriate because:IV treatments appropriate due to intensity of illness or inability to take PO, Inpatient level of care appropriate due to severity of illness    Dispo: The patient is from: Home              Anticipated d/c is to: Home vs snf              Anticipated d/c date is: > 3 days              Patient currently is not medically stable to d/c.   Consultants:  General surgery  GI  Procedures:  NG tube placement  Antibiotics:  . Zosyn IV 08/27/2019>  Anti-infectives (From admission, onward)   Start     Dose/Rate Route Frequency Ordered Stop   08/27/19 2100  piperacillin-tazobactam (ZOSYN) IVPB 3.375 g     Discontinue    "Followed by" Linked Group Details   3.375 g 12.5 mL/hr over 240 Minutes Intravenous Every 8 hours 08/27/19 1448     08/27/19 1500  piperacillin-tazobactam (ZOSYN) IVPB  3.375 g       "Followed by" Linked Group Details   3.375 g 100 mL/hr over 30 Minutes Intravenous  Once 08/27/19 1448 08/27/19 1752     Subjective: Patient seen and examined.  Feels better than yesterday.  Some abdominal pain.  No other complaint.  Objective: Vitals:   08/31/19 0420 08/31/19 0737  BP: 115/85 119/84  Pulse: 84 78  Resp: 14 17  Temp: (!) 97.4 F (36.3 C) 97.7 F (36.5 C)  SpO2: 97% 98%    Intake/Output Summary  (Last 24 hours) at 08/31/2019 1331 Last data filed at 08/31/2019 0500 Gross per 24 hour  Intake --  Output 725 ml  Net -725 ml   Filed Weights   08/26/19 1716 08/26/19 1759  Weight: 72 kg 68 kg   Body mass index is 24.21 kg/m.   Physical Exam: General exam: Appears calm and comfortable  Respiratory system: Clear to auscultation. Respiratory effort normal. Cardiovascular system: S1 & S2 heard, RRR. No JVD, murmurs, rubs, gallops or clicks. No pedal edema. Gastrointestinal system: Abdomen is nondistended, soft and tender around incision.  Colostomy bag with stool.. No organomegaly or masses felt.  Diminished bowel sounds Central nervous system: Alert and oriented. No focal neurological deficits. Extremities: Symmetric 5 x 5 power. Skin: No rashes, lesions or ulcers.  Psychiatry: Judgement and insight appear normal. Mood & affect appropriate.    Data Review: I have personally reviewed the following laboratory data and studies,  CBC: Recent Labs  Lab 08/26/19 1804 08/29/19 0057 08/30/19 0247 08/31/19 0312  WBC 7.2 6.4 9.6 10.8*  HGB 14.3 11.9* 12.6* 11.0*  HCT 42.8 35.5* 38.1* 33.0*  MCV 88.6 91.0 90.9 89.4  PLT 471* 281 303 161   Basic Metabolic Panel: Recent Labs  Lab 08/26/19 1804 08/28/19 0200 08/29/19 0057 08/30/19 0247 08/31/19 0312  NA 135 138 137 136 134*  K 4.6 4.4 3.6 3.8 4.4  CL 104 106 106 105 104  CO2 20* 23 21* 19* 20*  GLUCOSE 129* 89 63* 59* 86  BUN 11 18 7* 8 5*  CREATININE 1.05 1.39* 1.14 1.12 1.04  CALCIUM 8.8* 8.4* 7.9* 8.0* 7.8*  MG  --   --  1.4* 1.5* 1.3*  PHOS  --   --  2.8 2.3* 3.4   Liver Function Tests: Recent Labs  Lab 08/28/19 0200 08/29/19 0057 08/30/19 0247 08/31/19 0312  AST 58* 29 20 15   ALT 24 19 16 11   ALKPHOS 87 84 87 69  BILITOT 0.4 0.6 1.1 1.1  PROT 5.3* 5.0* 5.3* 4.4*  ALBUMIN 2.3* 2.2* 2.2* 1.8*   No results for input(s): LIPASE, AMYLASE in the last 168 hours. No results for input(s): AMMONIA in the last 168  hours. Cardiac Enzymes: No results for input(s): CKTOTAL, CKMB, CKMBINDEX, TROPONINI in the last 168 hours. BNP (last 3 results) Recent Labs    07/25/19 0246 07/26/19 0226  BNP 54.0 95.9    ProBNP (last 3 results) No results for input(s): PROBNP in the last 8760 hours.  CBG: Recent Labs  Lab 08/26/19 1814 08/27/19 1231 08/30/19 0822 08/30/19 0848  GLUCAP 123* 126* 53* 193*   Recent Results (from the past 240 hour(s))  SARS Coronavirus 2 by RT PCR (hospital order, performed in [facility] hospital lab) Nasopharyngeal Nasopharyngeal Swab     Status: None   Collection Time: 08/27/19 11:12 AM   Specimen: Nasopharyngeal Swab  Result Value Ref Range Status   SARS Coronavirus 2 NEGATIVE NEGATIVE Final  Comment: (NOTE) SARS-CoV-2 target nucleic acids are NOT DETECTED.  The SARS-CoV-2 RNA is generally detectable in upper and lower respiratory specimens during the acute phase of infection. The lowest concentration of SARS-CoV-2 viral copies this assay can detect is 250 copies / mL. A negative result does not preclude SARS-CoV-2 infection and should not be used as the sole basis for treatment or other patient management decisions.  A negative result may occur with improper specimen collection / handling, submission of specimen other than nasopharyngeal swab, presence of viral mutation(s) within the areas targeted by this assay, and inadequate number of viral copies (<250 copies / mL). A negative result must be combined with clinical observations, patient history, and epidemiological information.  Fact Sheet for Patients:   StrictlyIdeas.no  Fact Sheet for Healthcare Providers: BankingDealers.co.za  This test is not yet approved or  cleared by the [place] FDA and has been authorized for detection and/or diagnosis of SARS-CoV-2 by FDA under an Emergency Use Authorization (EUA).  This EUA will remain in effect (meaning this  test can be used) for the duration of the COVID-19 declaration under Section 564(b)(1) of the Act, 21 U.S.C. section 360bbb-3(b)(1), unless the authorization is terminated or revoked sooner.  Performed at [facility], [place] [address], [place], [place] [address]      Studies: DG BE (COLON)W SINGLE CM (SOL OR THIN BA)  Result Date: 08/29/2019 CLINICAL DATA:  Sigmoid colon wall thickening with possible bowel obstruction EXAM: WATER SOLUBLE CONTRAST ENEMA TECHNIQUE: Water-soluble contrast enema performed using 450 cc of Omnipaque 300 diluted in 1500 mL of water. Contrast medium was instilled under slow low gravity conditions. FLUOROSCOPY TIME:  Fluoroscopy Time:  3 minutes, 30 seconds Radiation Exposure Index (if provided by the fluoroscopic device): 131.7 Number of Acquired Spot Images: 7 COMPARISON:  Multiple exams, including CT abdomen from 08/27/2019 FINDINGS: Initial KUB demonstrates gaseous prominence of small bowel and large bowel suspicious for ileus or bowel obstruction. A nasogastric tube is present in the stomach body. Initially the rectum was filled without insufflation of the retention balloon. For some of the later images, I did inflate the retention balloon due to partial lack of containment of the enema. On series 6, contrast is noted to extend through a very thin, irregular lumen of the sigmoid colon along a 6.1 cm sharply defined segment as shown on the recent CT examination. However, contrast also extends from the lumen of this segment into a longitudinally oriented collection which extends partially along the irregular segment of sigmoid colon, partially just below dilated segment of colon extending to this irregular narrowed segment. This is probably tracking longitudinally in the colon wall (for example, see image 18/11) but could conceivably represent a small contained collection or even a colocolic fistula. The connection of this collection to the narrowed lumen is shown on  image 6/7. The relationship of the narrowed luminal channel intramural or contained collection of contrast along the inferior colon margin is also shown on image 2/20. Because of the sluggish nature of contrast extension into the dilated segment, the more proximal colon was not filled. IMPRESSION: 1. 6.1 cm markedly thinned and irregular sigmoid colon lumen, especially along a approximately 3 cm segment immediately adjacent to the dilated descending colon. Given the irregularity of the lumen, and the presence of diverticula, this could well be from severe diverticulosis related wall thickening, possibly with some active diverticulitis; based purely on imaging, exclusion of tumor in this vicinity is not readily feasible. I was  able to slowly pass contrast through this narrowed lumen in a retrograde manner, but given how narrow this segment is, it may well be acting to cause partial obstruction. 2. Connected to this irregular and thinned lumen, there a 5 cm in length longitudinal collection of contrast tracking either within the inferior wall of the involved segment, or representing a linear, contained perforation tracking in a along the margin of the wall of the colon. Although outside of the colon lumen, this does not spread freely in the abdomen despite turning and tilting the patient. Electronically Signed   By: Van Clines M.D.   On: 08/29/2019 16:30   Total time spent: 32 minutes  Darliss Cheney, MD  [facility] 08/31/2019

## 2019-08-31 NOTE — Evaluation (Signed)
Occupational Therapy Evaluation Patient Details Name: Roy [name] MRN: [id number] DOB: 1945-07-04 Today's Date: 08/31/2019    History of Present Illness Pt is a 74 y.o. male admitted 08/26/19 with abdominal pain. Imaging suggestive of diverticulitis with a bowel obstruction. 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 PMH includes HTN, CAD, depression. Of note, pt with a couple recent admissions with similar symptoms.   Clinical Impression   Pt was independent prior to admission. Presents with generalized weakness, abdominal pain and some mild instability in standing. He reports feeling more confident with RW use as he has been in the bed since admission and NPO. Pt overall requires set up to min guard assist for ADL. Likely to progress well and not require post acute OT. Will follow acutely.    Follow Up Recommendations  No OT follow up    Equipment Recommendations  None recommended by OT    Recommendations for Other Services       Precautions / Restrictions Precautions Precautions: Fall Precaution Comments: colostomy, NGT Restrictions Weight Bearing Restrictions: No      Mobility Bed Mobility              General bed mobility comments: pt received in chair  Transfers Overall transfer level: Needs assistance Equipment used: Rolling walker (2 wheeled) Transfers: Sit to/from Stand Sit to Stand: Min guard         General transfer comment: min guard for safety, pt feels more confident with RW    Balance Overall balance assessment: Mild deficits observed, not formally tested                                         ADL either performed or assessed with clinical judgement   ADL Overall ADL's : Needs assistance/impaired Eating/Feeding: Independent   Grooming: Wash/dry hands;Wash/dry face;Sitting;Set up   Upper Body Bathing: Set up;Sitting   Lower Body Bathing: Min guard;Sit to/from stand   Upper Body Dressing : Set up;Sitting    Lower Body Dressing: Min guard;Sit to/from stand   Toilet Transfer: Min guard;Ambulation;RW   Toileting- Water quality scientist and Hygiene: Min guard;Sit to/from stand       Functional mobility during ADLs: Min guard;Rolling walker General ADL Comments: pt able to don socks using figure 4 method     Vision Baseline Vision/History: Wears glasses Wears Glasses: At all times Patient Visual Report: No change from baseline       Perception     Praxis      Pertinent Vitals/Pain Pain Assessment: Faces Faces Pain Scale: Hurts even more Pain Location: Abdomen Pain Descriptors / Indicators: Discomfort;Grimacing;Guarding;Moaning Pain Intervention(s): Monitored during session;Premedicated before session;Repositioned     Hand Dominance Right   Extremity/Trunk Assessment Upper Extremity Assessment Upper Extremity Assessment: Overall WFL for tasks assessed   Lower Extremity Assessment Lower Extremity Assessment: Defer to PT evaluation   Cervical / Trunk Assessment Cervical / Trunk Assessment: Other exceptions Cervical / Trunk Exceptions: abdominal pain, new colostomy   Communication Communication Communication: HOH   Cognition Arousal/Alertness: Awake/alert Behavior During Therapy: WFL for tasks assessed/performed Overall Cognitive Status: Within Functional Limits for tasks assessed                                     General Comments  Initiated basic colostomy education - including checking how full  bag is prior to mobility, abdominal precautions for comfort    Exercises     Shoulder Instructions      Home Living Family/patient expects to be discharged to:: Private residence Living Arrangements: Alone Available Help at Discharge: Family;Available PRN/intermittently Type of Home: House Home Access: Stairs to enter [employer] of Steps: 6 Entrance Stairs-Rails: Right;Left Home Layout: One level     Bathroom Shower/Tub: Arts administrator: Standard     Home Equipment: None   Additional Comments: Has access to RW      Prior Functioning/Environment Level of Independence: Independent        Comments: 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 [employer]        OT Problem List: Decreased activity tolerance;Decreased strength;Pain      OT Treatment/Interventions: Self-care/ADL training;DME and/or AE instruction;Patient/family education;Balance training    OT Goals(Current goals can be found in the care plan section) Acute Rehab OT Goals Patient Stated Goal: Decreased pain OT Goal Formulation: With patient Time For Goal Achievement: 09/14/19 Potential to Achieve Goals: Good ADL Goals Pt Will Transfer to Toilet: Independently;ambulating Pt Will Perform Toileting - Clothing Manipulation and hygiene: Independently (standing) Pt Will Perform Tub/Shower Transfer: Independently;ambulating;Tub transfer Additional ADL Goal #1: Pt will gather items necessary for ADL around his room independently. Additional ADL Goal #2: Pt will perform bathing and dressing independently.  OT Frequency: Min 2X/week   Barriers to D/C:            Co-evaluation              AM-PAC OT "6 Clicks" Daily Activity     Outcome Measure Help from another person eating meals?: None Help from another person taking care of personal grooming?: None Help from another person toileting, which includes using toliet, bedpan, or urinal?: A Little Help from another person bathing (including washing, rinsing, drying)?: A Little Help from another person to put on and taking off regular upper body clothing?: None Help from another person to put on and taking off regular lower body clothing?: A Little 6 Click Score: 21   End of Session Equipment Utilized During Treatment: Rolling walker  Activity Tolerance: Patient tolerated treatment well Patient left: in chair;with call bell/phone within reach  OT Visit Diagnosis:  Muscle weakness (generalized) (M62.81);Pain                Time: 4854-6270 OT Time Calculation (min): 19 min Charges:  OT General Charges $OT Visit: 1 Visit OT Evaluation $OT Eval Moderate Complexity: 1 Mod  Nestor Lewandowsky, OTR/L Acute Rehabilitation Services Pager: [phone] Office: [phone] Malka So 08/31/2019, 10:53 AM

## 2019-09-01 LAB — CBC
HCT: 27 % — ABNORMAL LOW (ref 39.0–52.0)
Hemoglobin: 9.3 g/dL — ABNORMAL LOW (ref 13.0–17.0)
MCH: 30.9 pg (ref 26.0–34.0)
MCHC: 34.4 g/dL (ref 30.0–36.0)
MCV: 89.7 fL (ref 80.0–100.0)
Platelets: 233 10*3/uL (ref 150–400)
RBC: 3.01 MIL/uL — ABNORMAL LOW (ref 4.22–5.81)
RDW: 14.9 % (ref 11.5–15.5)
WBC: 8.8 10*3/uL (ref 4.0–10.5)
nRBC: 0 % (ref 0.0–0.2)

## 2019-09-01 LAB — BASIC METABOLIC PANEL
Anion gap: 10 (ref 5–15)
BUN: <5 mg/dL — ABNORMAL LOW (ref 8–23)
CO2: 22 mmol/L (ref 22–32)
Calcium: 7.7 mg/dL — ABNORMAL LOW (ref 8.9–10.3)
Chloride: 103 mmol/L (ref 98–111)
Creatinine, Ser: 0.93 mg/dL (ref 0.61–1.24)
GFR calc Af Amer: >60 mL/min (ref >60)
GFR calc non Af Amer: >60 mL/min (ref >60)
Glucose, Bld: 74 mg/dL (ref 70–99)
Potassium: 3.2 mmol/L — ABNORMAL LOW (ref 3.5–5.1)
Sodium: 135 mmol/L (ref 135–145)

## 2019-09-01 LAB — SURGICAL PATHOLOGY

## 2019-09-01 LAB — MAGNESIUM: Magnesium: 2 mg/dL (ref 1.7–2.4)

## 2019-09-01 MED ORDER — CARVEDILOL 12.5 MG PO TABS
12.5000 mg | ORAL_TABLET | Freq: Two times a day (BID) | ORAL | Status: DC
  Administered 2019-09-01 – 2019-09-06 (×10): 12.5 mg via ORAL
  Filled 2019-09-01 (×10): qty 1

## 2019-09-01 MED ORDER — PROSOURCE PLUS PO LIQD
30.0000 mL | Freq: Two times a day (BID) | ORAL | Status: DC
  Administered 2019-09-01 – 2019-09-02 (×2): 30 mL via ORAL
  Filled 2019-09-01 (×2): qty 30

## 2019-09-01 MED ORDER — ADULT MULTIVITAMIN W/MINERALS CH
1.0000 | ORAL_TABLET | Freq: Every day | ORAL | Status: DC
  Administered 2019-09-01 – 2019-09-06 (×6): 1 via ORAL
  Filled 2019-09-01 (×6): qty 1

## 2019-09-01 MED ORDER — BOOST / RESOURCE BREEZE PO LIQD CUSTOM
1.0000 | Freq: Three times a day (TID) | ORAL | Status: DC
  Administered 2019-09-01 – 2019-09-02 (×4): 1 via ORAL

## 2019-09-01 MED ORDER — POTASSIUM CHLORIDE 20 MEQ PO PACK
40.0000 meq | PACK | Freq: Once | ORAL | Status: AC
  Administered 2019-09-01: 40 meq via ORAL
  Filled 2019-09-01: qty 2

## 2019-09-01 MED ORDER — POTASSIUM CHLORIDE 10 MEQ/100ML IV SOLN
10.0000 meq | INTRAVENOUS | Status: AC
  Administered 2019-09-01 (×3): 10 meq via INTRAVENOUS
  Filled 2019-09-01 (×3): qty 100

## 2019-09-01 MED ORDER — POTASSIUM CHLORIDE 10 MEQ/100ML IV SOLN
10.0000 meq | INTRAVENOUS | Status: DC
  Administered 2019-09-01: 10 meq via INTRAVENOUS
  Filled 2019-09-01: qty 100

## 2019-09-01 NOTE — Progress Notes (Signed)
Physical Therapy Treatment Patient Details Name: Roy [name] MRN: [id number] DOB: 02/06/1945 Today's Date: 09/01/2019    History of Present Illness Pt is a 74 y.o. male admitted 08/26/19 with abdominal pain. Imaging suggestive of diverticulitis with a bowel obstruction. 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 PMH includes HTN, CAD, depression. Of note, pt with a couple recent admissions with similar symptoms.   PT Comments    Pt progressing well with mobility. Able to ambulate entire unit with RW and perform stair training, supervision-level for safety with lines. Reinforced abdominal precautions for comfort. Encouraged more frequent hallway ambulation with assist from staff for line management. Educ re: activity recommendations for return home. Pt motivated to participate.   Follow Up Recommendations  No PT follow up     Equipment Recommendations  None recommended by PT    Recommendations for Other Services       Precautions / Restrictions Precautions Precautions: Fall;Other (comment) Precaution Comments: Abdominal precautions for comfort s/p colostomy Restrictions Weight Bearing Restrictions: No    Mobility  Bed Mobility Overal bed mobility: Needs Assistance Bed Mobility: Rolling;Sidelying to Sit;Sit to Sidelying Rolling: Supervision Sidelying to sit: Min assist     Sit to sidelying: Supervision General bed mobility comments: Initial trial with HOB elevated and use of bed rail, supervision for log roll technique. Additional supine<>sit trial using log roll technique with bed flat and no rail, minA for trunk elevation, limited by pain  Transfers Overall transfer level: Needs assistance Equipment used: Rolling walker (2 wheeled) Transfers: Sit to/from Stand Sit to Stand: Supervision         General transfer comment: Supervision for safety with lines  Ambulation/Gait Ambulation/Gait assistance: Supervision Gait Distance (Feet): 600 Feet Assistive  device: Rolling walker (2 wheeled) Gait Pattern/deviations: Step-through pattern;Decreased stride length   Gait velocity interpretation: 1.31 - 2.62 ft/sec, indicative of limited community ambulator General Gait Details: Pt requesting use of RW; moving well without overt instabilty or LOB, supervision for safety with lines   Stairs Stairs: Yes Stairs assistance: Supervision Stair Management: One rail Right;Step to pattern;Forwards;With walker Number of Stairs: 4 General stair comments: Ascend/descend 2 steps x2 trials with rail support, supervision for safety with lines; limited by pain. Additional stair training with platform step and RW (pt has 1 step down into living room), pt performed forwards/backwards with RW and supervision   Wheelchair Mobility    Modified Rankin (Stroke Patients Only)       Balance Overall balance assessment: Needs assistance   Sitting balance-Leahy Scale: Good       Standing balance-Leahy Scale: Fair Standing balance comment: Can static stand to don mask without UE support                            Cognition Arousal/Alertness: Awake/alert Behavior During Therapy: WFL for tasks assessed/performed Overall Cognitive Status: Within Functional Limits for tasks assessed                                        Exercises      General Comments        Pertinent Vitals/Pain Pain Assessment: Faces Faces Pain Scale: Hurts even more Pain Location: Abdomen Pain Descriptors / Indicators: Discomfort;Grimacing;Guarding;Moaning Pain Intervention(s): Monitored during session;Limited activity within patient's tolerance;Repositioned    Home Living  Prior Function            PT Goals (current goals can now be found in the care plan section) Progress towards PT goals: Progressing toward goals    Frequency    Min 3X/week      PT Plan Current plan remains appropriate    Co-evaluation               AM-PAC PT "6 Clicks" Mobility   Outcome Measure  Help needed turning from your back to your side while in a flat bed without using bedrails?: None Help needed moving from lying on your back to sitting on the side of a flat bed without using bedrails?: A Little Help needed moving to and from a bed to a chair (including a wheelchair)?: None Help needed standing up from a chair using your arms (e.g., wheelchair or bedside chair)?: None Help needed to walk in hospital room?: None Help needed climbing 3-5 steps with a railing? : None 6 Click Score: 23    End of Session   Activity Tolerance: Patient tolerated treatment well Patient left: in chair;with call bell/phone within reach Nurse Communication: Mobility status PT Visit Diagnosis: Other abnormalities of gait and mobility (R26.89);Pain     Time: 0836-0900 PT Time Calculation (min) (ACUTE ONLY): 24 min  Charges:  $Gait Training: 8-22 mins $Therapeutic Exercise: 8-22 mins                    Mabeline Caras, PT, DPT Acute Rehabilitation Services  Pager [phone] Office [place] 09/01/2019, 9:29 AM

## 2019-09-01 NOTE — Progress Notes (Signed)
2 Days Post-Op  Subjective: Pain well controlled- a little more sore in mid-abdomen today. No nausea after NG removal. Continues to have copious ostomy output.    ROS: See above, otherwise other systems negative  Objective: Vital signs in last 24 hours: Temp:  [97.5 F (36.4 C)-98.2 F (36.8 C)] 97.5 F (36.4 C) (08/19 0436) Pulse Rate:  [77-89] 77 (08/19 0436) Resp:  [14-17] 17 (08/19 0436) BP: (121-138)/(76-87) 138/86 (08/19 0436) SpO2:  [97 %-98 %] 98 % (08/19 0436) Last BM Date: 08/31/19  Intake/Output from previous day: 08/18 0701 - 08/19 0700 In: -  Out: 2050 [Urine:1000; Stool:1050] Intake/Output this shift: No intake/output data recorded.  PE: Heart: regular Lungs: CTAB Abd: soft, minimally/ less distended (starting to look scaphoid), appropriately tender. Incision c/d/i under honeycomb, stoma is pink, everted and productive with gas and liquid stool.  Lab Results:  Recent Labs    08/31/19 0312 09/01/19 0251  WBC 10.8* 8.8  HGB 11.0* 9.3*  HCT 33.0* 27.0*  PLT 307 233   BMET Recent Labs    08/31/19 0312 09/01/19 0251  NA 134* 135  K 4.4 3.2*  CL 104 103  CO2 20* 22  GLUCOSE 86 74  BUN 5* <5*  CREATININE 1.04 0.93  CALCIUM 7.8* 7.7*   PT/INR No results for input(s): LABPROT, INR in the last 72 hours. CMP     Component Value Date/Time   NA 135 09/01/2019 0251   K 3.2 (L) 09/01/2019 0251   CL 103 09/01/2019 0251   CO2 22 09/01/2019 0251   GLUCOSE 74 09/01/2019 0251   GLUCOSE 118 (H) 12/08/2005 0741   BUN <5 (L) 09/01/2019 0251   CREATININE 0.93 09/01/2019 0251   CREATININE 1.11 04/02/2015 1514   CALCIUM 7.7 (L) 09/01/2019 0251   PROT 4.4 (L) 08/31/2019 0312   PROT 6.6 03/26/2016 0736   ALBUMIN 1.8 (L) 08/31/2019 0312   ALBUMIN 4.0 03/26/2016 0736   AST 15 08/31/2019 0312   ALT 11 08/31/2019 0312   ALKPHOS 69 08/31/2019 0312   BILITOT 1.1 08/31/2019 0312   BILITOT 0.3 03/26/2016 0736   GFRNONAA >60 09/01/2019 0251   GFRAA >60  09/01/2019 0251   Lipase     Component Value Date/Time   LIPASE 18 08/10/2019 1936       Studies/Results: No results found.  Anti-infectives: Anti-infectives (From admission, onward)   Start     Dose/Rate Route Frequency Ordered Stop   08/27/19 2100  piperacillin-tazobactam (ZOSYN) IVPB 3.375 g  Status:  Discontinued       "Followed by" Linked Group Details   3.375 g 12.5 mL/hr over 240 Minutes Intravenous Every 8 hours 08/27/19 1448 09/01/19 0805   08/27/19 1500  piperacillin-tazobactam (ZOSYN) IVPB 3.375 g       "Followed by" Linked Group Details   3.375 g 100 mL/hr over 30 Minutes Intravenous  Once 08/27/19 1448 08/27/19 1752       Assessment/Plan HTN HLD CADs/p PCI 2001 and 2016- hold plavix.  ECHO7/12 shows EF 70-75%. Cardiology reports patient is moderate cardiovascular risk DM - A1c 6.2 OSA AKI -resolved, IVFs  LBO - mass vs colitis - patient returns with similar symptoms noted previously - prealbumin 15 - BE confirms narrowed irregular segment with possible contained perforation - s/p hartmans 08/30/19 Dr. Kae Heller  FEN: decrease IVF to 75, try clears today VTE: SCDs, lovenox ID: Zosyn 8/14>>09/01/19  Plan: Stop abx, try clear liquids today, decrease IVF, replace K+, continue Multimodal pain regimen and  lovenox prophylaxis. WOCN and TOC following, PT/OT following. Mobilize.     LOS: 5 days    Roy [name] , [facility] 09/01/2019, 8:05 AM Please see Amion for pager number during day hours 7:00am-4:30pm or 7:00am -11:30am on weekends

## 2019-09-01 NOTE — Progress Notes (Signed)
PROGRESS NOTE  Roy [name] WCH:[id number] DOB: November 02, 1945 DOA: 08/26/2019 PCP: Jinny Sanders, MD   LOS: 5 days   Brief narrative: As per HPI,  Roy [name] is a 73 y.o. male with medical history significant for coronary artery disease status post stent angioplasty, hypertension, depression, obstructive sleep apnea  presented to the emergency room with complaints of abdominal pain intermittently for 1 month.  Patient was recently admitted to the hospital and at that time he had a colonoscopy and was told he had a bowel obstruction.  His GI pathogen panel was positive for Yersinia and he was treated with doxycycline. since his discharge he has continued to have intermittent periumbilical pain with worsening appetite and weight loss.  He did follow-up with GI after discharge.  In the ED, his labs revealed sodium level of 135, potassium of 4.6, bicarb of 20, lactic acid of 2, white count of 7.2, hemoglobin of 14.3. CT scan of abdomen pelvis showed findings most consistent with sigmoid diverticulitis resulting in obstruction of the proximal large and small bowel. The degree of obstruction has increased since 08/10/2019. Biopsy results from recent colonoscopy did not demonstrate malignancy or active inflammation.  Patient was then admitted to hospital for further evaluation and treatment of acute small and large bowel obstruction and acute diverticulitis.  He was started on broad-spectrum antibiotics and conservative management with NG tube and IV fluids and was kept NPO. Patient had barium enema done which showed irregular narrowing at the sigmoid colon region.  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  Assessment/Plan:  Principal Problem:   Large bowel obstruction (HCC) Active Problems:   Depression, major, recurrent ([place])   Essential hypertension   Coronary artery disease involving native coronary artery of native  heart without angina pectoris   Diverticulitis  Acute small and large bowel obstruction/acute diverticulitis: Patient was initially on conservative treatment with NG tube, IV fluids n.p.o. status.    Patient had barium enema done which showed irregular narrowing at the sigmoid colon region.  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  Doing fine today.  Pain improved.  Has been started on clears.  Antibiotics discontinued today by surgery.  Management deferred to general surgery and appreciate their help.  Essential hypertension: Controlled.  Resume home dose of p.o. Coreg starting tonight now that he is on diet.  Continue IV hydralazine as needed in the meantime.  Hypomagnesemia.  Resolved.  Hypophosphatemia.  Resolved.  Hypokalemia: We will replace.  DVT prophylaxis: enoxaparin (LOVENOX) injection 30 mg Start: 08/31/19 0900 SCDs Start: 08/27/19 1433    Code Status: DNR  Family Communication: None present at bedside.  Plan of care discussed with patient.  Status is: Inpatient  Remains inpatient appropriate because:IV treatments appropriate due to intensity of illness or inability to take PO, Inpatient level of care appropriate due to severity of illness    Dispo: The patient is from: Home              Anticipated d/c is to: Home               Anticipated d/c date is: 2-3 days              Patient currently is not medically stable to d/c.   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  Zosyn IV 08/27/2019>  Anti-infectives (From admission, onward)   Start     Dose/Rate Route Frequency Ordered Stop   08/27/19 2100  piperacillin-tazobactam (ZOSYN) IVPB 3.375 g  Status:  Discontinued       "Followed by" Linked Group Details   3.375 g 12.5 mL/hr over 240 Minutes Intravenous Every 8 hours 08/27/19 1448 09/01/19  0805   08/27/19 1500  piperacillin-tazobactam (ZOSYN) IVPB 3.375 g       "Followed by" Linked Group Details   3.375 g 100 mL/hr over 30 Minutes Intravenous  Once 08/27/19 1448 08/27/19 1752     Subjective: Seen and examined.  Some abdominal pain but improved than yesterday.  No new complaint.  Objective: Vitals:   08/31/19 1950 09/01/19 0436  BP: 121/76 138/86  Pulse: 84 77  Resp: 14 17  Temp: 98.2 F (36.8 C) (!) 97.5 F (36.4 C)  SpO2: 98% 98%    Intake/Output Summary (Last 24 hours) at 09/01/2019 1115 Last data filed at 09/01/2019 0500 Gross per 24 hour  Intake --  Output 2050 ml  Net -2050 ml   Filed Weights   08/26/19 1716 08/26/19 1759  Weight: 72 kg 68 kg   Body mass index is 24.21 kg/m.   Physical Exam: General exam: Appears calm and comfortable  Respiratory system: Clear to auscultation. Respiratory effort normal. Cardiovascular system: S1 & S2 heard, RRR. No JVD, murmurs, rubs, gallops or clicks. No pedal edema. Gastrointestinal system: Abdomen is nondistended, soft and moderate generalized tenderness. No organomegaly or masses felt. Normal bowel sounds heard.  Colostomy bag filled with gas and stool. Central nervous system: Alert and oriented. No focal neurological deficits. Extremities: Symmetric 5 x 5 power. Skin: No rashes, lesions or ulcers.  Psychiatry: Judgement and insight appear normal. Mood & affect appropriate.    Data Review: I have personally reviewed the following laboratory data and studies,  CBC: Recent Labs  Lab 08/26/19 1804 08/29/19 0057 08/30/19 0247 08/31/19 0312 09/01/19 0251  WBC 7.2 6.4 9.6 10.8* 8.8  HGB 14.3 11.9* 12.6* 11.0* 9.3*  HCT 42.8 35.5* 38.1* 33.0* 27.0*  MCV 88.6 91.0 90.9 89.4 89.7  PLT 471* 281 303 307 480   Basic Metabolic Panel: Recent Labs  Lab 08/28/19 0200 08/29/19 0057 08/30/19 0247 08/31/19 0312 09/01/19 0251  NA 138 137 136 134* 135  K 4.4 3.6 3.8 4.4 3.2*  CL 106 106 105 104 103  CO2 23  21* 19* 20* 22  GLUCOSE 89 63* 59* 86 74  BUN 18 7* 8 5* <5*  CREATININE 1.39* 1.14 1.12 1.04 0.93  CALCIUM 8.4* 7.9* 8.0* 7.8* 7.7*  MG  --  1.4* 1.5* 1.3* 2.0  PHOS  --  2.8 2.3* 3.4  --    Liver Function Tests: Recent Labs  Lab 08/28/19 0200 08/29/19 0057 08/30/19 0247 08/31/19 0312  AST 58* 29 20 15   ALT 24 19 16 11   ALKPHOS 87 84 87 69  BILITOT 0.4 0.6 1.1 1.1  PROT 5.3* 5.0* 5.3* 4.4*  ALBUMIN 2.3* 2.2* 2.2* 1.8*   No results for input(s): LIPASE, AMYLASE in the last 168 hours. No results for input(s): AMMONIA in the last 168 hours. Cardiac Enzymes: No results for input(s): CKTOTAL, CKMB, CKMBINDEX, TROPONINI in the last 168 hours. BNP (last 3 results) Recent Labs    07/25/19 0246 07/26/19 0226  BNP 54.0 95.9    ProBNP (last 3 results) No results for input(s): PROBNP in the last 8760 hours.  CBG: Recent Labs  Lab 08/26/19 1814 08/27/19 1231 08/30/19 0822 08/30/19 0848  GLUCAP 123* 126* 53* 193*   Recent Results (from the past 240 hour(s))  SARS Coronavirus 2 by RT PCR (hospital order, performed in [facility] hospital lab) Nasopharyngeal Nasopharyngeal Swab     Status: None   Collection Time: 08/27/19 11:12 AM   Specimen: Nasopharyngeal Swab  Result Value Ref Range Status   SARS Coronavirus 2 NEGATIVE NEGATIVE Final    Comment: (NOTE) SARS-CoV-2 target nucleic acids are NOT DETECTED.  The SARS-CoV-2 RNA is generally detectable in upper and lower respiratory specimens during the acute phase of infection. The lowest concentration of SARS-CoV-2 viral copies this assay can detect is 250 copies / mL. A negative result does not preclude SARS-CoV-2 infection and should not be used as the sole basis for treatment or other patient management decisions.  A negative result may occur with improper specimen collection / handling, submission of specimen other than nasopharyngeal swab, presence of viral mutation(s) within the areas targeted by this assay, and  inadequate number of viral copies (<250 copies / mL). A negative result must be combined with clinical observations, patient history, and epidemiological information.  Fact Sheet for Patients:   StrictlyIdeas.no  Fact Sheet for Healthcare Providers: BankingDealers.co.za  This test is not yet approved or  cleared by the [place] FDA and has been authorized for detection and/or diagnosis of SARS-CoV-2 by FDA under an Emergency Use Authorization (EUA).  This EUA will remain in effect (meaning this test can be used) for the duration of the COVID-19 declaration under Section 564(b)(1) of the Act, 21 U.S.C. section 360bbb-3(b)(1), unless the authorization is terminated or revoked sooner.  Performed at [facility], [place] [address], [place], [place] [address]      Studies: No results found. Total time spent: 30 minutes  Darliss Cheney, MD  [facility] 09/01/2019

## 2019-09-01 NOTE — Plan of Care (Signed)

## 2019-09-01 NOTE — Progress Notes (Signed)
Initial Nutrition Assessment  RD working remotely.  DOCUMENTATION CODES:   Not applicable  INTERVENTION:   -Boost Breeze po TID, each supplement provides 250 kcal and 9 grams of protein -MVI with minerals daily -30 ml Prosource Plus BID, each supplement provides 100 kcals and 15 grams protein -RD will follow for diet advancement and adjust supplement regimen as appropriate -If prolonged NPO/clear liquid diet status is anticipated, consider initiation of nutrition support  NUTRITION DIAGNOSIS:   Increased nutrient needs related to post-op healing as evidenced by estimated needs.  GOAL:   Patient will meet greater than or equal to 90% of their needs  MONITOR:   PO intake, Supplement acceptance, Diet advancement, Labs, Weight trends, Skin, I & O's  REASON FOR ASSESSMENT:   NPO/Clear Liquid Diet    ASSESSMENT:   Roy [name] is a 74 y.o. male with medical history significant for coronary artery disease status post stent angioplasty, hypertension, depression, obstructive sleep apnea who presents to the emergency room for evaluation of abdominal pain mostly periumbilical.  Pt admitted with SBO and acute diverticulitis.   8/14- NGT placed for decompression 8/16- barium enema revealed sigmoid lumen narrowing and irregularity, possible Severe diverticulitis, collected contrast, possible Contained perf 8/17- s/p Procedure performed: sigmoid colectomy with end colostomy 8/18- NGT clamped, NGT d/c 8/19- advanced to clear liquids  Reviewed I/O's: -2.1 L x 24 hours and -2.9 L since admission  UOP: 1 L x 24 hours  Colostomy output:1.1 L x 24 hours  Attempted to speak with pt via call to hospital room phone, however, no answer.   Pt advanced to clear liquids this morning. Noted meal completion 50-75%. Pt would benefit from addition of oral nutrition supplements to support post-operative healing.    Per H&P, pt endorses poor appetite and weight loss PTA secondary to  intermittent abdominal pain over the past month.   Reviewed wt hx; pt has experienced a 8.4% wt loss over the past month, which is significant for time frame.   Pt with large colostomy output (noted 1.1 L x 24 hours).   Lab Results  Component Value Date   HGBA1C 6.2 (H) 07/23/2019   PTA DM medications are none.   Labs reviewed: K: 3.2, CBGS: 53-193 (inpatient orders for glycemic control are none).   Diet Order:   Diet Order            Diet clear liquid Room service appropriate? Yes; Fluid consistency: Thin  Diet effective now                 EDUCATION NEEDS:   No education needs have been identified at this time  Skin:  Skin Assessment: Skin Integrity Issues: Skin Integrity Issues:: Incisions Incisions: closed abdomen  Last BM:  08/31/19 (1050 ml output via colostomy)  Height:   Ht Readings from Last 1 Encounters:  08/26/19 5\' 6"  (1.676 m)    Weight:   Wt Readings from Last 1 Encounters:  08/26/19 68 kg    Ideal Body Weight:  64.5 kg  BMI:  Body mass index is 24.21 kg/m.  Estimated Nutritional Needs:   Kcal:  2050-2250  Protein:  105-120 grams  Fluid:  > 2 L    Loistine Chance, RD, LDN, [place] Registered Dietitian II Certified Diabetes Care and Education Specialist Please refer to [facility] for RD and/or RD on-call/weekend/after hours pager

## 2019-09-02 LAB — BASIC METABOLIC PANEL
Anion gap: 6 (ref 5–15)
BUN: <5 mg/dL — ABNORMAL LOW (ref 8–23)
CO2: 23 mmol/L (ref 22–32)
Calcium: 7.8 mg/dL — ABNORMAL LOW (ref 8.9–10.3)
Chloride: 107 mmol/L (ref 98–111)
Creatinine, Ser: 0.83 mg/dL (ref 0.61–1.24)
GFR calc Af Amer: >60 mL/min (ref >60)
GFR calc non Af Amer: >60 mL/min (ref >60)
Glucose, Bld: 94 mg/dL (ref 70–99)
Potassium: 3.9 mmol/L (ref 3.5–5.1)
Sodium: 136 mmol/L (ref 135–145)

## 2019-09-02 LAB — CBC
HCT: 29.1 % — ABNORMAL LOW (ref 39.0–52.0)
Hemoglobin: 9.9 g/dL — ABNORMAL LOW (ref 13.0–17.0)
MCH: 30.6 pg (ref 26.0–34.0)
MCHC: 34 g/dL (ref 30.0–36.0)
MCV: 89.8 fL (ref 80.0–100.0)
Platelets: 237 10*3/uL (ref 150–400)
RBC: 3.24 MIL/uL — ABNORMAL LOW (ref 4.22–5.81)
RDW: 14.9 % (ref 11.5–15.5)
WBC: 6.6 10*3/uL (ref 4.0–10.5)
nRBC: 0 % (ref 0.0–0.2)

## 2019-09-02 LAB — MAGNESIUM: Magnesium: 1.5 mg/dL — ABNORMAL LOW (ref 1.7–2.4)

## 2019-09-02 MED ORDER — PROSOURCE PLUS PO LIQD
30.0000 mL | Freq: Every day | ORAL | Status: DC
  Administered 2019-09-03 – 2019-09-05 (×3): 30 mL via ORAL
  Filled 2019-09-02 (×3): qty 30

## 2019-09-02 MED ORDER — ENSURE ENLIVE PO LIQD
237.0000 mL | Freq: Two times a day (BID) | ORAL | Status: DC
  Administered 2019-09-02 – 2019-09-06 (×8): 237 mL via ORAL

## 2019-09-02 MED ORDER — METHOCARBAMOL 500 MG PO TABS
500.0000 mg | ORAL_TABLET | Freq: Four times a day (QID) | ORAL | Status: DC | PRN
  Administered 2019-09-02: 500 mg via ORAL
  Filled 2019-09-02: qty 1

## 2019-09-02 MED ORDER — MAGNESIUM SULFATE 2 GM/50ML IV SOLN
2.0000 g | Freq: Once | INTRAVENOUS | Status: AC
  Administered 2019-09-02: 2 g via INTRAVENOUS
  Filled 2019-09-02: qty 50

## 2019-09-02 NOTE — Progress Notes (Signed)
Occupational Therapy Treatment Patient Details Name: Roy [name] MRN: [id number] DOB: 18-May-1945 Today's Date: 09/02/2019    History of present illness Pt is a 74 y.o. male admitted 08/26/19 with abdominal pain. Imaging suggestive of diverticulitis with a bowel obstruction. 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 PMH includes HTN, CAD, depression. Of note, pt with a couple recent admissions with similar symptoms.   OT comments  Pt stating he was having less pain and was feeling more confident in ability to manage is colostomy following education earlier today. Cues for bed mobility and hand placement with walker use, but only needing supervision for ambulation. Will continue to follow.  Follow Up Recommendations  No OT follow up    Equipment Recommendations  None recommended by OT    Recommendations for Other Services      Precautions / Restrictions Precautions Precautions: Fall Precaution Comments: Abdominal precautions for comfort s/p colostomy       Mobility Bed Mobility Overal bed mobility: Needs Assistance Bed Mobility: Rolling;Sidelying to Sit Rolling: Supervision Sidelying to sit: Supervision       General bed mobility comments: verbal cues for log roll technique to minimize abdominal pain  Transfers Overall transfer level: Needs assistance Equipment used: Rolling walker (2 wheeled) Transfers: Sit to/from Stand Sit to Stand: Supervision         General transfer comment: cues for hand placement, no physical assist    Balance Overall balance assessment: Needs assistance   Sitting balance-Leahy Scale: Good       Standing balance-Leahy Scale: Fair                             ADL either performed or assessed with clinical judgement   ADL Overall ADL's : Needs assistance/impaired     Grooming: Supervision/safety;Standing;Wash/dry hands           Upper Body Dressing : Set up;Sitting       Toilet Transfer:  Supervision/safety;Ambulation;RW Toilet Transfer Details (indicate cue type and reason): stood Toileting- Water quality scientist and Hygiene: Supervision/safety       Functional mobility during ADLs: Supervision/safety;Rolling walker (in hall)       Vision       Perception     Praxis      Cognition Arousal/Alertness: Awake/alert Behavior During Therapy: WFL for tasks assessed/performed Overall Cognitive Status: Within Functional Limits for tasks assessed                                          Exercises     Shoulder Instructions       General Comments      Pertinent Vitals/ Pain       Pain Assessment: Faces Faces Pain Scale: Hurts little more Pain Location: Abdomen Pain Descriptors / Indicators: Discomfort;Sore;Guarding Pain Intervention(s): Monitored during session;Repositioned  Home Living                                          Prior Functioning/Environment              Frequency  Min 2X/week        Progress Toward Goals  OT Goals(current goals can now be found in the care plan section)  Progress towards OT goals: Progressing toward  goals  Acute Rehab OT Goals Patient Stated Goal: be able to manage at home OT Goal Formulation: With patient Time For Goal Achievement: 09/14/19 Potential to Achieve Goals: Good  Plan Discharge plan remains appropriate    Co-evaluation                 AM-PAC OT "6 Clicks" Daily Activity     Outcome Measure   Help from another person eating meals?: None Help from another person taking care of personal grooming?: None Help from another person toileting, which includes using toliet, bedpan, or urinal?: A Little Help from another person bathing (including washing, rinsing, drying)?: A Little Help from another person to put on and taking off regular upper body clothing?: None Help from another person to put on and taking off regular lower body clothing?: A Little 6  Click Score: 21    End of Session Equipment Utilized During Treatment: Rolling walker  OT Visit Diagnosis: Muscle weakness (generalized) (M62.81);Pain   Activity Tolerance Patient tolerated treatment well   Patient Left in chair;with call bell/phone within reach   Nurse Communication          Time: 3668-1594 OT Time Calculation (min): 16 min  Charges: OT General Charges $OT Visit: 1 Visit OT Treatments $Self Care/Home Management : 8-22 mins  Nestor Lewandowsky, OTR/L Acute Rehabilitation Services Pager: [phone] Office: [phone]  Roy [name] 09/02/2019, 12:16 PM

## 2019-09-02 NOTE — Progress Notes (Signed)
PROGRESS NOTE  Roy [name] YQM:[id number] DOB: 10/03/1945 DOA: 08/26/2019 PCP: Jinny Sanders, MD   LOS: 6 days   Brief narrative: As per HPI,  Roy [name] is a 74 y.o. male with medical history significant for coronary artery disease status post stent angioplasty, hypertension, depression, obstructive sleep apnea  presented to the emergency room with complaints of abdominal pain intermittently for 1 month.  Patient was recently admitted to the hospital and at that time he had a colonoscopy and was told he had a bowel obstruction.  His GI pathogen panel was positive for Yersinia and he was treated with doxycycline. since his discharge he has continued to have intermittent periumbilical pain with worsening appetite and weight loss.  He did follow-up with GI after discharge.  In the ED, his labs revealed sodium level of 135, potassium of 4.6, bicarb of 20, lactic acid of 2, white count of 7.2, hemoglobin of 14.3. CT scan of abdomen pelvis showed findings most consistent with sigmoid diverticulitis resulting in obstruction of the proximal large and small bowel. The degree of obstruction has increased since 08/10/2019. Biopsy results from recent colonoscopy did not demonstrate malignancy or active inflammation.  Patient was then admitted to hospital for further evaluation and treatment of acute small and large bowel obstruction and acute diverticulitis.  He was started on broad-spectrum antibiotics and conservative management with NG tube and IV fluids and was kept NPO. Patient had barium enema done which showed irregular narrowing at the sigmoid colon region.  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  Assessment/Plan:  Principal Problem:   Large bowel obstruction (HCC) Active Problems:   Depression, major, recurrent ([place])   Essential hypertension   Coronary artery disease involving native coronary artery of native  heart without angina pectoris   Diverticulitis  Acute small and large bowel obstruction/acute diverticulitis: Patient was initially on conservative treatment with NG tube, IV fluids n.p.o. status.    Patient had barium enema done which showed irregular narrowing at the sigmoid colon region.  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  Doing fine today.  Pain improved.  Tolerating clears.  Advance to full liquid diet.Marland Kitchen  Antibiotics discontinued today by surgery on 08/31/2019.  He is getting education on how to manage his colostomy bag.  He tells me that he was not happy with how he was trained this morning and does not feel comfortable going home unless he is fully trained.  I was informed by general surgery nurse practitioner that he will get more training over the weekend and repeat training by wound care on Monday and then hopefully discharge Monday. further management deferred to general surgery and appreciate their help.  Essential hypertension: Controlled.  Continue carvedilol.  Hypomagnesemia.  Replenished again today.  Hypophosphatemia.  Resolved.  Hypokalemia: Resolved.  DVT prophylaxis: enoxaparin (LOVENOX) injection 30 mg Start: 08/31/19 0900 SCDs Start: 08/27/19 1433    Code Status: DNR  Family Communication: None present at bedside.  Plan of care discussed with patient.  Status is: Inpatient  Remains inpatient appropriate because:IV treatments appropriate due to intensity of illness or inability to take PO, Inpatient level of care appropriate due to severity of illness    Dispo: The patient is from: Home              Anticipated d/c is to: Home  Anticipated d/c date is: 2-3 days              Patient currently is not medically stable to d/c.   Consultants:  General surgery  GI  Procedures:  NG tube placement sigmoid colectomy with end colostomy on  08/30/2019  Antibiotics:  . Zosyn IV 08/27/2019>  Anti-infectives (From admission, onward)   Start     Dose/Rate Route Frequency Ordered Stop   08/27/19 2100  piperacillin-tazobactam (ZOSYN) IVPB 3.375 g  Status:  Discontinued       "Followed by" Linked Group Details   3.375 g 12.5 mL/hr over 240 Minutes Intravenous Every 8 hours 08/27/19 1448 09/01/19 0805   08/27/19 1500  piperacillin-tazobactam (ZOSYN) IVPB 3.375 g       "Followed by" Linked Group Details   3.375 g 100 mL/hr over 30 Minutes Intravenous  Once 08/27/19 1448 08/27/19 1752     Subjective: Seen and examined.  Abdominal pain improving.  Slightly frustrated stating that he did not get good training on how to take care of his colostomy bag and does not feel comfortable going home unless he is fully trained and confident.  Also states that he has not been seen by PT.  I informed him that PT has cleared him to go home and thus they will not see him again.  There is an order for him to be walked twice a day.  I requested his primary RN to make sure he walks twice a day.  Objective: Vitals:   09/02/19 0449 09/02/19 0855  BP: 138/81 (!) 142/88  Pulse: 71 77  Resp: 16 17  Temp: 97.8 F (36.6 C) 98.1 F (36.7 C)  SpO2: 97% 98%    Intake/Output Summary (Last 24 hours) at 09/02/2019 1315 Last data filed at 09/02/2019 0500 Gross per 24 hour  Intake --  Output 1300 ml  Net -1300 ml   Filed Weights   08/26/19 1716 08/26/19 1759  Weight: 72 kg 68 kg   Body mass index is 24.21 kg/m.   Physical Exam: General exam: Appears calm and comfortable  Respiratory system: Clear to auscultation. Respiratory effort normal. Cardiovascular system: S1 & S2 heard, RRR. No JVD, murmurs, rubs, gallops or clicks. No pedal edema. Gastrointestinal system: Abdomen is nondistended, soft and mild generalized tenderness. No organomegaly or masses felt. Normal bowel sounds heard.  Colostomy bag filled with normal color stool. Central nervous  system: Alert and oriented. No focal neurological deficits. Extremities: Symmetric 5 x 5 power. Skin: No rashes, lesions or ulcers.  Psychiatry: Judgement and insight appear normal. Mood & affect appropriate.   Data Review: I have personally reviewed the following laboratory data and studies,  CBC: Recent Labs  Lab 08/29/19 0057 08/30/19 0247 08/31/19 0312 09/01/19 0251 09/02/19 0422  WBC 6.4 9.6 10.8* 8.8 6.6  HGB 11.9* 12.6* 11.0* 9.3* 9.9*  HCT 35.5* 38.1* 33.0* 27.0* 29.1*  MCV 91.0 90.9 89.4 89.7 89.8  PLT 281 303 307 233 428   Basic Metabolic Panel: Recent Labs  Lab 08/29/19 0057 08/30/19 0247 08/31/19 0312 09/01/19 0251 09/02/19 0422  NA 137 136 134* 135 136  K 3.6 3.8 4.4 3.2* 3.9  CL 106 105 104 103 107  CO2 21* 19* 20* 22 23  GLUCOSE 63* 59* 86 74 94  BUN 7* 8 5* <5* <5*  CREATININE 1.14 1.12 1.04 0.93 0.83  CALCIUM 7.9* 8.0* 7.8* 7.7* 7.8*  MG 1.4* 1.5* 1.3* 2.0 1.5*  PHOS 2.8 2.3* 3.4  --   --  Liver Function Tests: Recent Labs  Lab 08/28/19 0200 08/29/19 0057 08/30/19 0247 08/31/19 0312  AST 58* 29 20 15   ALT 24 19 16 11   ALKPHOS 87 84 87 69  BILITOT 0.4 0.6 1.1 1.1  PROT 5.3* 5.0* 5.3* 4.4*  ALBUMIN 2.3* 2.2* 2.2* 1.8*   No results for input(s): LIPASE, AMYLASE in the last 168 hours. No results for input(s): AMMONIA in the last 168 hours. Cardiac Enzymes: No results for input(s): CKTOTAL, CKMB, CKMBINDEX, TROPONINI in the last 168 hours. BNP (last 3 results) Recent Labs    07/25/19 0246 07/26/19 0226  BNP 54.0 95.9    ProBNP (last 3 results) No results for input(s): PROBNP in the last 8760 hours.  CBG: Recent Labs  Lab 08/26/19 1814 08/27/19 1231 08/30/19 0822 08/30/19 0848  GLUCAP 123* 126* 53* 193*   Recent Results (from the past 240 hour(s))  SARS Coronavirus 2 by RT PCR (hospital order, performed in [facility] hospital lab) Nasopharyngeal Nasopharyngeal Swab     Status: None   Collection Time: 08/27/19 11:12 AM    Specimen: Nasopharyngeal Swab  Result Value Ref Range Status   SARS Coronavirus 2 NEGATIVE NEGATIVE Final    Comment: (NOTE) SARS-CoV-2 target nucleic acids are NOT DETECTED.  The SARS-CoV-2 RNA is generally detectable in upper and lower respiratory specimens during the acute phase of infection. The lowest concentration of SARS-CoV-2 viral copies this assay can detect is 250 copies / mL. A negative result does not preclude SARS-CoV-2 infection and should not be used as the sole basis for treatment or other patient management decisions.  A negative result may occur with improper specimen collection / handling, submission of specimen other than nasopharyngeal swab, presence of viral mutation(s) within the areas targeted by this assay, and inadequate number of viral copies (<250 copies / mL). A negative result must be combined with clinical observations, patient history, and epidemiological information.  Fact Sheet for Patients:   StrictlyIdeas.no  Fact Sheet for Healthcare Providers: BankingDealers.co.za  This test is not yet approved or  cleared by the [place] FDA and has been authorized for detection and/or diagnosis of SARS-CoV-2 by FDA under an Emergency Use Authorization (EUA).  This EUA will remain in effect (meaning this test can be used) for the duration of the COVID-19 declaration under Section 564(b)(1) of the Act, 21 U.S.C. section 360bbb-3(b)(1), unless the authorization is terminated or revoked sooner.  Performed at [facility], [place] [address] [place], [place] [address]      Studies: No results found. Total time spent: 29 minutes  Darliss Cheney, MD  [facility] 09/02/2019

## 2019-09-02 NOTE — Progress Notes (Signed)
Nutrition Follow-up  DOCUMENTATION CODES:   Not applicable  INTERVENTION:   -Ensure Enlive po BID, each supplement provides 350 kcal and 20 grams of protein -30 ml Prosource Plus daily, each supplement provides 100 kcals and 15 grams protein -MVI with minerals daily -Provided "Colostomy Nutrition Therapy" handout from AND's Nutrition Care Manual  NUTRITION DIAGNOSIS:   Increased nutrient needs related to post-op healing as evidenced by estimated needs.  Ongoing  GOAL:   Patient will meet greater than or equal to 90% of their needs  Progressing   MONITOR:   PO intake, Supplement acceptance, Diet advancement, Labs, Weight trends, Skin, I & O's  REASON FOR ASSESSMENT:   NPO/Clear Liquid Diet    ASSESSMENT:   Roy [name] is a 74 y.o. male with medical history significant for coronary artery disease status post stent angioplasty, hypertension, depression, obstructive sleep apnea who presents to the emergency room for evaluation of abdominal pain mostly periumbilical.  1/74- NGT placed for decompression 8/16- barium enema revealed sigmoid lumen narrowing and irregularity, possible Severe diverticulitis, collected contrast, possible Contained perf 8/17- s/p Procedure performed:sigmoid colectomy with end colostomy 8/18- NGT clamped, NGT d/c 8/19- advanced to clear liquids 8/20- advanced to full liquids  Reviewed I/O's: -1.3 L x 24 hours and -4.2 L since admission  Spoke with pt at bedside, who reports decreased appetite over the past month due to abdominal pain. Pt reports "I was living off of Pediaylte and Boost". He estimates he has lost about 20 pounds over the past month related to poor oral intake. Reviewed wt hx; pt has experienced a 8.4% wt loss over the past month, which is significant for time frame.   Discussed component of full liquid and soft diet. He is amenable to oral  nutrition supplements. Discussed importance of good meal and supplement intake to  promote healing.   Labs reviewed: Mg: 1.5 (on IV supplementation), CBGS: 193.   NUTRITION - FOCUSED PHYSICAL EXAM:    Most Recent Value  Orbital Region Mild depletion  Upper Arm Region No depletion  Thoracic and Lumbar Region No depletion  Buccal Region No depletion  Temple Region Mild depletion  Clavicle Bone Region No depletion  Clavicle and Acromion Bone Region No depletion  Scapular Bone Region No depletion  Dorsal Hand No depletion  Patellar Region No depletion  Anterior Thigh Region No depletion  Posterior Calf Region No depletion  Edema (RD Assessment) None  Hair Reviewed  Eyes Reviewed  Mouth Reviewed  Skin Reviewed  Nails Reviewed       Diet Order:   Diet Order            Diet full liquid Room service appropriate? Yes; Fluid consistency: Thin  Diet effective now                 EDUCATION NEEDS:   No education needs have been identified at this time  Skin:  Skin Assessment: Skin Integrity Issues: Skin Integrity Issues:: Incisions Incisions: closed abdomen  Last BM:  08/31/19 (1050 ml output via colostomy)  Height:   Ht Readings from Last 1 Encounters:  08/26/19 5\' 6"  (1.676 m)    Weight:   Wt Readings from Last 1 Encounters:  08/26/19 68 kg    Ideal Body Weight:  64.5 kg  BMI:  Body mass index is 24.21 kg/m.  Estimated Nutritional Needs:   Kcal:  2050-2250  Protein:  105-120 grams  Fluid:  > 2 L    Loistine Chance, RD, LDN, CDCES  Registered Dietitian II Certified Diabetes Care and Education Specialist Please refer to [facility] for RD and/or RD on-call/weekend/after hours pager

## 2019-09-02 NOTE — Progress Notes (Signed)
3 Days Post-Op  Subjective: Doing well.  Tolerating CLD with no issues.  Ostomy is working still and output finally slowing down some.  Didn't mobilize yesterday, but really wanted to.  Very uncomfortable with ostomy and needs/wants more practice with it.    ROS: See above, otherwise other systems negative  Objective: Vital signs in last 24 hours: Temp:  [97.8 F (36.6 C)-98.7 F (37.1 C)] 98.1 F (36.7 C) (08/20 0855) Pulse Rate:  [71-80] 77 (08/20 0855) Resp:  [16-17] 17 (08/20 0855) BP: (118-142)/(75-88) 142/88 (08/20 0855) SpO2:  [96 %-98 %] 98 % (08/20 0855) Last BM Date: 08/31/19  Intake/Output from previous day: 08/19 0701 - 08/20 0700 In: -  Out: 1300 [Urine:1300] Intake/Output this shift: No intake/output data recorded.  PE: Abd: soft, appropriately tender, +BS, ND, incision is c/d/i with staples present and a honeycomb dressing in place.  No obvious signs of infection noted currently.  Ostomy is viable and working well currently  Lab Results:  Recent Labs    09/01/19 0251 09/02/19 0422  WBC 8.8 6.6  HGB 9.3* 9.9*  HCT 27.0* 29.1*  PLT 233 237   BMET Recent Labs    09/01/19 0251 09/02/19 0422  NA 135 136  K 3.2* 3.9  CL 103 107  CO2 22 23  GLUCOSE 74 94  BUN <5* <5*  CREATININE 0.93 0.83  CALCIUM 7.7* 7.8*   PT/INR No results for input(s): LABPROT, INR in the last 72 hours. CMP     Component Value Date/Time   NA 136 09/02/2019 0422   K 3.9 09/02/2019 0422   CL 107 09/02/2019 0422   CO2 23 09/02/2019 0422   GLUCOSE 94 09/02/2019 0422   GLUCOSE 118 (H) 12/08/2005 0741   BUN <5 (L) 09/02/2019 0422   CREATININE 0.83 09/02/2019 0422   CREATININE 1.11 04/02/2015 1514   CALCIUM 7.8 (L) 09/02/2019 0422   PROT 4.4 (L) 08/31/2019 0312   PROT 6.6 03/26/2016 0736   ALBUMIN 1.8 (L) 08/31/2019 0312   ALBUMIN 4.0 03/26/2016 0736   AST 15 08/31/2019 0312   ALT 11 08/31/2019 0312   ALKPHOS 69 08/31/2019 0312   BILITOT 1.1 08/31/2019 0312    BILITOT 0.3 03/26/2016 0736   GFRNONAA >60 09/02/2019 0422   GFRAA >60 09/02/2019 0422   Lipase     Component Value Date/Time   LIPASE 18 08/10/2019 1936       Studies/Results: No results found.  Anti-infectives: Anti-infectives (From admission, onward)   Start     Dose/Rate Route Frequency Ordered Stop   08/27/19 2100  piperacillin-tazobactam (ZOSYN) IVPB 3.375 g  Status:  Discontinued       "Followed by" Linked Group Details   3.375 g 12.5 mL/hr over 240 Minutes Intravenous Every 8 hours 08/27/19 1448 09/01/19 0805   08/27/19 1500  piperacillin-tazobactam (ZOSYN) IVPB 3.375 g       "Followed by" Linked Group Details   3.375 g 100 mL/hr over 30 Minutes Intravenous  Once 08/27/19 1448 08/27/19 1752       Assessment/Plan HTN HLD CADs/p PCI 2001 and 2016- hold plavix.  ECHO7/12 shows EF 70-75%. Cardiology reports patient is moderate cardiovascular risk DM - A1c 6.2 OSA AKI -resolved, IVFs  POD 3, s/p ex lap with sigmoid colectomy, colostomy for diverticular stricture, Dr. Kae Heller, 8/17 - adv to full liquids/boost -mobilize -WOC team is following.  Patient needs more education before feeling comfortable going home with this.  He lives by himself -[facility] RN ordered  for routine colostomy care at home.  He has not yet been enrolled in the [place] program.  Unclear why.  Will defer to [place] to arrange this. -monitor wound closely as he has feculent contamination in PACU from colostomy pouch leaking  FEN: FLD VTE: SCDs, lovenox ID: Zosyn 8/14>>09/01/19   LOS: 6 days    Henreitta Cea , [facility] 09/02/2019, 9:32 AM Please see Amion for pager number during day hours 7:00am-4:30pm or 7:00am -11:30am on weekends

## 2019-09-02 NOTE — Consult Note (Signed)
   [facility] Inpatient Consult   09/02/2019  ARYN KOPS 05-Jan-1946 [id number]   [facility] [ACO] Patient: Roy [name] Medicare   Patient screened for high risk score for unplanned readmission and for less than 30 days readmission hospitalization.  Medical record and spoke with patient at the bedside to check for potential [place] Management service needs.  Spoke with patient in the room and explained [place] Management services for post hospital follow up.  Patient is hard of hearing wears bilateral hearing aides.  Review of patient's medical record reveals patient is for home health. Patient in agreement for post hospital follow up.  Review shows patient is vaccinated for COVID-19: [place] on 03/08/19 and 03/29/19        A brochure and 24 hour nurse line magnet given.  Primary Care Provider is Jinny Sanders, MD this provider is listed to provide the transition of care [TOC] for post hospital follow up.   Plan:  Follow up with inpatient [facility] team RN CM states [facility] will follow. Continue to follow progress and disposition to assess for post hospital care management needs.    For questions contact:   Natividad Brood, RN BSN [facility] Liaison  [phone] business mobile phone Toll free office [phone]  Fax number: [phone] [email or web address]

## 2019-09-02 NOTE — Consult Note (Signed)
[place] Nurse ostomy follow up Stoma type/location: LLQ colostomy  Stomal assessment/size: 1 3/4" pink and moist, well budded Peristomal assessment: creasing at 9 o'clock.  Midline staple line .  Honeycomb dressing removed.  Treatment options for stomal/peristomal skin: barrier ring and 2 piece pouch Output liquid green stool Ostomy pouching: 2pc. 2 3/4" pouch with barrier ring  Education provided: Pouch change performed.  Discussed twice weekly pouch changes, showering and his occupation as a car transporter.  Discussed need to empty when 1/3 full.  RATIONALE FOR barrier ring.  Patient cut barrier to fit.  Connected pouch and barrier and observed application process.  Rolled pouch open and closed and demonstrated emptying.  Will see again Monday.  Enrolled patient in [place] Discharge program: Yes today.

## 2019-09-02 NOTE — Discharge Instructions (Addendum)
Wet to Dry WOUND CARE: - Change dressing twice daily - Supplies: sterile saline, gauze, scissors, tape  1. Remove dressing and all packing carefully, moistening with sterile saline as needed to avoid packing/internal dressing sticking to the wound. 2.   Clean edges of skin around the wound with water/gauze, making sure there is no tape debris or leakage left on skin that could cause skin irritation or breakdown. 3.   Dampen and clean kerlex/piece of gauze with sterile saline and pack wound from wound base to skin level, making sure to take note of any possible areas of wound tracking, tunneling and packing appropriately. Wound can be packed loosely. Trim kerlex to size if a whole kerlex is not required. 4.   Cover wound with a dry piece of gauze and secure with tape.  5.   Write the date/time on the dry dressing/tape to better track when the last dressing change occurred. - Make sure to avoid any leakage of stool from your colostomy into your wound as this may cause infection - apply any skin protectant/powder if recommended by clinician to protect skin/skin folds. - change dressing as needed if leakage occurs, wound gets contaminated, or patient requests to shower. - You may shower daily with wound open and following the shower the wound should be dried and a clean dressing placed.  - Medical grade tape as well as packing supplies can be found at [employer] on [address] or [employer] on [address]. The remaining supplies can be found at your local drug store, [employer] etc. - If you develop fever, redness/heat or purulent like drainage from your midline wound, please call the office so you can be seen soon or directed where to go for evaluation.    Colostomy Nutrition Therapy    This handout will provide some basic information about the effects of the colostomy surgery on digestion, absorption, and dietary changes for better results.  Colostomy surgery is when  part of the large intestine (colon) is removed or bypassed. The remaining part of the functioning colon is brought through the abdominal wall, creating a stoma. It can be temporary or permanent depending on the surgery.  After surgery, your bowel is swollen and you should avoid high-fiber foods. This is because they are harder to digest, and avoiding them will allow the bowel to heal and to avoid blockage of the colostomy. The eating plan begins with clear liquids and then should be advanced to a fiber-restricted diet before leaving the hospital. If you have lactose intolerance, then use lactose-free milk products. Chew your food slowly and well to help reduce the risk of blockage of the ostomy.  As you recover, you will start eating solid foods, beginning with foods that are low in fiber (see Recommended Foods). The fiber-restricted diet should contain less than 13 grams of fiber for the whole day and may be less than 8 grams fiber per day depending on your symptoms.  Most patients begin to eat more normally 6 weeks after surgery.  Tips  Take small bites of foods and chew thoroughly for better digestion and absorption of nutrients.  Some foods may cause blockages, especially if eaten in large amounts or not chewed well. Use caution when eating these foods, eat small amounts only, and chew them thoroughly, which will also help with better absorption of nutrients.  You may find that your appetite is not as good as before surgery, so eating small amounts about every 2-4 hours is recommended. Keeping  a regular schedule for meals and snacks can help reduce gas and result in better absorption of the nutrients from the foods.  Eat meals and snacks at the same times each day.  Eating the largest meal in the middle of the day may decrease stool output at night, making it easier for you to get a full night's rest.  Avoid acidic, spicy, fried or greasy foods, as well as foods that are high in sugar  (candy, cake, pies, cookies, and sugary drinks). All of these foods can cause diarrhea.  Foods that can thicken stools include bananas, applesauce, rice, and pasta.  Some foods may cause odors or increase gas production (see Foods Not Recommended list).  To reduce gas, avoid chewing gum, drinking with straws, carbonated beverages, smoking or chewing tobacco, eating too fast, and skipping meals. Missing meals can cause the small intestine to be more active and increase gas and watery stools. There is a "lag time"--the time from eating a gas-producing food and actual release of gas is 6-8 hours for distal colostomy patients.  Limit foods that may cause gas or odor and choose foods that may decrease odor.  Have at least 8 to 10 cups of fluids per day. You will need to drink more during hot weather, when you have increased stools, and at other times when you lose extra fluid, such as when you exercise. Fluid equivalents are: 1 ounce is 30 milliliters; 1 cup is 8 ounces; 8 cups is 64 ounces, or 2 quarts, or 2 liters.  It is important to watch for signs and symptoms of fluid-electrolyte imbalance, which include dry mouth, reduced urine output, dark concentrated urine, feelings of dizziness upon standing, marked fatigue, and abdominal cramping. If these occur, seek prompt treatment. Ileostomy patients are more at risk for dehydration. Remember, high-potassium foods are needed more to offset the effects of diarrhea.  Add foods containing fiber gradually to your diet, but avoid those that can cause blockages initially. When you do add those foods, eat only very small portions and chew your food very well. Keep a food journal of foods tried and how you feel after eating them. Only try one new food every 3 days. You will need to eat enough fiber and drink enough fluids to avoid constipation.  Use a chewable multivitamin with minerals (non-gummy) daily for best absorption.  Use a chewable or liquid calcium  supplement. Liquid has the best absorption.  Foods Recommended  Some people are sensitive to some of the foods listed on the Recommended Foods chart.  You may find that some of these foods cause you to have gas, unpleasant odors, or diarrhea.  If this is the case, you should stop eating the foods that bother you. After 2 to 3 weeks, you can try small amounts to see whether they still cause symptoms.   Most of the recommended foods are low in fiber because fiber can be hard for your body to digest while you are recovering.  When you first start eating solid foods, it is especially important that you stick to low-fiber choices (cooking vegetables and fruits or preparing canned items and removing skins and peels reduce the amount of fiber contained in these foods).  As you heal, you can try foods with more fiber, such as whole grain foods.   Food Group Recommended Foods Notes  Milk and milk products Evaporated milk* Skim, low-fat milk, lactose free Skim, low-fat milk* Powdered milk* Buttermilk* Soy milk, rice milk or almond milk Yogurt*  Kefir Cheese* Aged cheeses (cheddar cheese and Swiss cheese are lower in lactose) Low-fat ice cream* or sherbet* ? If you have diarrhea, try lactose-free milk and other lactose-free products. Lactose is a kind of sugar in milk that causes diarrhea in some people. ? Buttermilk, yogurt, and kefir can help keep your body from producing bad odors. ? Cheese can help thicken stools. It may be a good choice if you have diarrhea. ? Check the labels for calcium content of almond and rice milk for at least 30% calcium. These types of milk are not high in protein, so include other high-protein foods at meals and snacks to support healing. Foods marked with an asterisk (*) contain lactose.  Meats and other protein foods Any meat or poultry prepared without added fat Smooth nut butters (limit serving size to 2 tablespoons or less) Fish Eggs Lactose free cottage  cheese or other lactose free cheese ? Dried beans and peas can cause gas or bad odors. They should be avoided. ? For some people, fish, seafood, and eggs can cause bad odors. Try them in small amounts and see how your body reacts. Seafood, especially mollusks (oysters, clams and mussels), may cause blockage behind the stoma, so they must be chewed well. Avoid eating meats with casings, such as sausage or bratwurst. ? Peanut butter can help thicken stools. Smooth peanut butter may be a good choice if you have diarrhea. Do not eat chunky spreads.  Grains Bread, bagels, rolls, crackers, pasta, and cereals made from white or refined flour White rice ? At first, you should only choose grain foods that are made with refined grains (white flour and white rice). Check labels for less than 2 grams fiber per serving. ? White rice and pasta can help thicken stools. They may be good choices if you have diarrhea. ? As you recover, you can try foods made with whole grains (like whole wheat, brown rice, or oats). Start with small amounts and see how you feel after eating them.  Vegetables Most well-cooked vegetables without seeds or skins Iceberg lettuce (< 1cup), begin with only a small amount shredded fine on sandwich and increase amount gradually week by week) Strained vegetable juice Potatoes without skins ? Some vegetables are more likely than others to cause gas, odors, or diarrhea. ? Many people develop gas or odors when they eat onions, garlic, or leeks. ? Vegetables in the cabbage family are also likely to cause problems. Avoid cabbages, broccoli, brussel sprouts, and cauliflower. ? Asparagus may cause bad odors. ? Potatoes can help thicken stools. They may be a good choice if you have diarrhea.  Fruits Most fruit juices (without pulp) Peeled fruit Canned fruit Fresh fruit without edible seeds ? Juices may cause diarrhea; diluting with water can sometimes reduce diarrhea. Try only one type of juice for  several days and watch symptoms. Avoid prune juice and grape juice, as they are more likely to cause diarrhea. ? Fruit peels should be avoided because they are high in fiber. ? Bananas and applesauce can help thicken stools. They may be good choices if you have diarrhea.  Fats and oils Butter, cream, cream cheese, margarine, mayonnaise, and oils ? When possible, choose healthy oils and fats, such as canola and olive oils. Limit to 8 teaspoons daily, as this may improve food tolerance and symptoms of discomfort.  Beverages       Any, except alcoholic beverages, prune juice, and grape juice     ? Carbonated drinks (such as  soda) can cause gas. If you try them, start with a small amount. ? Alcoholic drinks (especially beer) can cause bad odors.  Others Avoid sugar substitutes, sugar alcohols such as xylitol and sorbitol. ? Cranberry juice can help keep your body from producing bad odors.   Foods Not Recommended  When you first start eating solid foods, avoid foods that are high in fiber, such as whole grains, dried beans, and most raw vegetables and fruits.  While you heal, you should also avoid any foods that cause you to experience odor, gas, diarrhea, or obstruction.  Foods That May Cause Blockage: Apples, unpeeled Dried fruit, raisins Relishes and olives  Bean sprouts Grapes Salad greens  Cabbage, raw Green peppers Seeds and nuts  Casing on sausage Mushrooms Spinach  Celery Nuts Tough, fibrous meats (for example, steak on grill)  Coconut Peas Vegetable and fruit skins  Coleslaw Pickles Whole grains  Corn Pineapple   Cucumbers Popcorn    Foods That May Cause Gas or Odor: Alcohol Cauliflower Grapes  Apples Cheeses, some types Green pepper  Asparagus Corn Melons  Bananas Cucumber Onions  Beer Dairy products Peanuts  Broccoli Dried beans and peas Prunes  Brussels sprouts Eggs Radishes  Cabbage Fatty foods Turnips  Carbonated beverages Fish    Foods That May  Discolor Stool: Beets Asparagus Spinach  Foods with red dye Broccoli    Foods That May Help Relieve Gas and Odor: Buttermilk Yogurt with active cultures  Cranberry juice Parsley   Foods That May Cause Diarrhea (Looser or More Frequent Stool): Alcohol (including beer) Fruit: fresh, canned, or dried Prune juice or prunes  Apricots (and stone fruits) Fruit juice: apple, grape, orange Raw vegetables  Beans, baked or legumes Gum, sugar free Spicy foods  Bran High-fat foods Soup  Broccoli High-sugar foods Sugar-free substitutes  Brussels sprouts Licorice Sugar-free foods containing mannitol or sorbitol  Cabbage Milk and dairy foods Tomatoes  Caffeinated drinks (especially hot) Nuts or seeds Turnip greens/green leafy vegetables  Chocolate Peaches (stone fruit) Wine  Corn Peas Wheat/whole grains  Fried meats, fish, and poultry Plums (stone fruit)    Foods That May Help Thicken Stool: Applesauce Saltines Oatmeal (when acceptable to have fiber)  Bananas Tapioca Pasta (sauce may increase symptoms)  White rice, boiled Peanut butter, creamy White bread (not high in fiber)  Cheese Potatoes, no skin Barley (when acceptable to have fiber)  Marshmallows Pretzels Yogurt      Colostomy Sample 1-Day Menu  Breakfast Omelet made with 2 egg whites 2 tablespoons grated cheese, for omelet 8 ounces cranberry juice (no pulp) (0.4 g fiber)  Morning Snack 1 English muffin (1.5 g fiber) 1 teaspoon margarine, for English muffin  Lunch 3 ounces pork loin, tender 1/2 cup white rice (0.5 g fiber) 1 teaspoon margarine, for french bread 1 slice [place] bread (0.5 g fiber) 1 small banana (3 g fiber) 1 cup lactose-free milk   Afternoon Snack 1 cup yogurt  Evening Meal 1 ounce (small handful) pretzels (0.5 g fiber) 2 cups iced tea 2 ounces [place] 1 ounce swiss cheese 2 slices white bread  1/2 cup applesauce (1.5 g fiber)  Evening Snack 2 whole graham crackers (1.5 g fiber) 1 tablespoon smooth peanut  butter (1 g fiber) 1 cup soy milk or lactose-free milk    Copyright 2021  [employer]. All rights reserved.  [facility], [place] [phone]  OPEN ABDOMINAL SURGERY: POST OP INSTRUCTIONS  Always review your  discharge instruction sheet given to you by the facility where your surgery was performed.  IF YOU HAVE DISABILITY OR FAMILY LEAVE FORMS, YOU MUST BRING THEM TO THE OFFICE FOR PROCESSING.  PLEASE DO NOT GIVE THEM TO YOUR DOCTOR.  1. A prescription for pain medication may be given to you upon discharge.  Take your pain medication as prescribed, if needed.  If narcotic pain medicine is not needed, then you may take acetaminophen (Tylenol) or ibuprofen (Advil) as needed. 2. Take your usually prescribed medications unless otherwise directed. 3. If you need a refill on your pain medication, please contact your pharmacy. They will contact our office to request authorization.  Prescriptions will not be filled after 5pm or on week-ends. 4. You should follow a light diet the first few days after arrival home, such as soup and crackers, pudding, etc.unless your doctor has advised otherwise. A high-fiber, low fat diet can be resumed as tolerated.   Be sure to include lots of fluids daily. Most patients will experience some swelling and bruising on the chest and neck area.  Ice packs will help.  Swelling and bruising can take several days to resolve 5. Most patients will experience some swelling and bruising in the area of the incision. Ice pack will help. Swelling and bruising can take several days to resolve..  6. It is common to experience some constipation if taking pain medication after surgery.  Increasing fluid intake and taking a stool softener will usually help or prevent this problem from occurring.  A mild laxative (Milk of Magnesia or Miralax) should be taken according to package directions if there are no bowel movements after 48 hours. 7.  You may  have steri-strips (small skin tapes) in place directly over the incision.  These strips should be left on the skin for 7-10 days.  If your surgeon used skin glue on the incision, you may shower in 24 hours.  The glue will flake off over the next 2-3 weeks.  Any sutures or staples will be removed at the office during your follow-up visit. You may find that a light gauze bandage over your incision may keep your staples from being rubbed or pulled. You may shower and replace the bandage daily. 8. ACTIVITIES:  You may resume regular (light) daily activities beginning the next day--such as daily self-care, walking, climbing stairs--gradually increasing activities as tolerated.  You may have sexual intercourse when it is comfortable.  Refrain from any heavy lifting or straining until approved by your doctor. a. You may drive when you no longer are taking prescription pain medication, you can comfortably wear a seatbelt, and you can safely maneuver your car and apply brakes b. Return to Work: ___________________________________ 25. You should see your doctor in the office for a follow-up appointment approximately two weeks after your surgery.  Make sure that you call for this appointment within a day or two after you arrive home to insure a convenient appointment time. OTHER INSTRUCTIONS:  _____________________________________________________________ _____________________________________________________________  WHEN TO CALL YOUR DOCTOR: 1. Fever over 101.0 2. Inability to urinate 3. Nausea and/or vomiting 4. Extreme swelling or bruising 5. Continued bleeding from incision. 6. Increased pain, redness, or drainage from the incision. 7. Difficulty swallowing or breathing 8. Muscle cramping or spasms. 9. Numbness or tingling in hands or feet or around lips.  The clinic staff is available to answer your questions during regular business hours.  Please don't hesitate to call and ask to speak to one of the  nurses if you have concerns.  For further questions, please visit [email or web address]

## 2019-09-03 LAB — BASIC METABOLIC PANEL
Anion gap: 9 (ref 5–15)
BUN: 5 mg/dL — ABNORMAL LOW (ref 8–23)
CO2: 23 mmol/L (ref 22–32)
Calcium: 7.8 mg/dL — ABNORMAL LOW (ref 8.9–10.3)
Chloride: 108 mmol/L (ref 98–111)
Creatinine, Ser: 0.79 mg/dL (ref 0.61–1.24)
GFR calc Af Amer: >60 mL/min (ref >60)
GFR calc non Af Amer: >60 mL/min (ref >60)
Glucose, Bld: 128 mg/dL — ABNORMAL HIGH (ref 70–99)
Potassium: 2.5 mmol/L — CL (ref 3.5–5.1)
Sodium: 140 mmol/L (ref 135–145)

## 2019-09-03 LAB — MAGNESIUM: Magnesium: 1.6 mg/dL — ABNORMAL LOW (ref 1.7–2.4)

## 2019-09-03 MED ORDER — MAGNESIUM SULFATE 2 GM/50ML IV SOLN
2.0000 g | Freq: Once | INTRAVENOUS | Status: AC
  Administered 2019-09-03: 2 g via INTRAVENOUS
  Filled 2019-09-03: qty 50

## 2019-09-03 MED ORDER — POTASSIUM CHLORIDE CRYS ER 20 MEQ PO TBCR
40.0000 meq | EXTENDED_RELEASE_TABLET | ORAL | Status: AC
  Administered 2019-09-03 (×3): 40 meq via ORAL
  Filled 2019-09-03 (×3): qty 2

## 2019-09-03 NOTE — Progress Notes (Signed)
PROGRESS NOTE  Roy [name] OVF:[id number] DOB: Oct 11, 1945 DOA: 08/26/2019 PCP: Jinny Sanders, MD   LOS: 7 days   Brief narrative: As per HPI,  Roy [name] is a 74 y.o. male with medical history significant for coronary artery disease status post stent angioplasty, hypertension, depression, obstructive sleep apnea  presented to the emergency room with complaints of abdominal pain intermittently for 1 month.  Patient was recently admitted to the hospital and at that time he had a colonoscopy and was told he had a bowel obstruction.  His GI pathogen panel was positive for Yersinia and he was treated with doxycycline. since his discharge he has continued to have intermittent periumbilical pain with worsening appetite and weight loss.  He did follow-up with GI after discharge.  In the ED, his labs revealed sodium level of 135, potassium of 4.6, bicarb of 20, lactic acid of 2, white count of 7.2, hemoglobin of 14.3. CT scan of abdomen pelvis showed findings most consistent with sigmoid diverticulitis resulting in obstruction of the proximal large and small bowel. The degree of obstruction has increased since 08/10/2019. Biopsy results from recent colonoscopy did not demonstrate malignancy or active inflammation.  Patient was then admitted to hospital for further evaluation and treatment of acute small and large bowel obstruction and acute diverticulitis.  He was started on broad-spectrum antibiotics and conservative management with NG tube and IV fluids and was kept NPO. Patient had barium enema done which showed irregular narrowing at the sigmoid colon region.  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  Assessment/Plan:  Principal Problem:   Large bowel obstruction (HCC) Active Problems:   Depression, major, recurrent ([place])   Essential hypertension   Coronary artery disease involving native coronary artery of native  heart without angina pectoris   Diverticulitis  Acute small and large bowel obstruction/acute diverticulitis: Patient was initially on conservative treatment with NG tube, IV fluids n.p.o. status.    Patient had barium enema done which showed irregular narrowing at the sigmoid colon region.  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  Doing fine today.  Pain improved.  Tolerating clears.  Advance to full liquid diet.Marland Kitchen  Antibiotics discontinued by surgery on 08/31/2019.  He is getting education on how to manage his colostomy bag.  He still does not feel comfortable doing it by himself.  General surgery plans to provide more education tomorrow and then wound care nurse will supervise and provide more education on Monday.  He has been advised to [employer] today.  He is tolerating diet for now.  Essential hypertension: Controlled.  Continue carvedilol.  Hypomagnesemia.  Low again today.  Will replace.  Recheck in the morning.  Hypophosphatemia.  Resolved.  Hypokalemia: 2.5.  Will replace with 3 doses of oral KCl.  Recheck in the morning.  DVT prophylaxis: enoxaparin (LOVENOX) injection 30 mg Start: 08/31/19 0900 SCDs Start: 08/27/19 1433    Code Status: DNR  Family Communication: None present at bedside.  Plan of care discussed with patient.  Status is: Inpatient  Remains inpatient appropriate because:IV treatments appropriate due to intensity of illness or inability to take PO, Inpatient level of care appropriate due to severity of illness    Dispo: The patient is from: Home              Anticipated d/c is to: Home  Anticipated d/c date is: 09/05/2019              Patient currently is medically stable to d/c.   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 Zosyn IV 08/27/2019>  08/31/2019  Anti-infectives (From admission, onward)   Start     Dose/Rate Route Frequency Ordered Stop   08/27/19 2100  piperacillin-tazobactam (ZOSYN) IVPB 3.375 g  Status:  Discontinued       "Followed by" Linked Group Details   3.375 g 12.5 mL/hr over 240 Minutes Intravenous Every 8 hours 08/27/19 1448 09/01/19 0805   08/27/19 1500  piperacillin-tazobactam (ZOSYN) IVPB 3.375 g       "Followed by" Linked Group Details   3.375 g 100 mL/hr over 30 Minutes Intravenous  Once 08/27/19 1448 08/27/19 1752     Subjective: Seen and examined.  Feels much better than yesterday.  Happy that he is walking twice a day.  Abdominal pain is very minimal.  Tolerating diet.  Objective: Vitals:   09/03/19 0348 09/03/19 0839  BP: 128/79 126/76  Pulse: 76 89  Resp: 14 17  Temp: (!) 97.5 F (36.4 C)   SpO2: 99% 100%    Intake/Output Summary (Last 24 hours) at 09/03/2019 1047 Last data filed at 09/03/2019 0500 Gross per 24 hour  Intake 675 ml  Output 2975 ml  Net -2300 ml   Filed Weights   08/26/19 1716 08/26/19 1759  Weight: 72 kg 68 kg   Body mass index is 24.21 kg/m.   Physical Exam:  General exam: Appears calm and comfortable  Respiratory system: Clear to auscultation. Respiratory effort normal. Cardiovascular system: S1 & S2 heard, RRR. No JVD, murmurs, rubs, gallops or clicks. No pedal edema. Gastrointestinal system: Abdomen is nondistended, soft and minimal tenderness. No organomegaly or masses felt. Normal bowel sounds heard.  Ostomy bag filled with normal color stool. Central nervous system: Alert and oriented. No focal neurological deficits. Extremities: Symmetric 5 x 5 power. Psychiatry: Judgement and insight appear normal. Mood & affect appropriate.    Data Review: I have personally reviewed the following laboratory data and studies,  CBC: Recent Labs  Lab 08/29/19 0057 08/30/19 0247 08/31/19 0312 09/01/19 0251 09/02/19 0422  WBC 6.4 9.6 10.8* 8.8 6.6  HGB 11.9*  12.6* 11.0* 9.3* 9.9*  HCT 35.5* 38.1* 33.0* 27.0* 29.1*  MCV 91.0 90.9 89.4 89.7 89.8  PLT 281 303 307 233 378   Basic Metabolic Panel: Recent Labs  Lab 08/29/19 0057 08/29/19 0057 08/30/19 0247 08/31/19 0312 09/01/19 0251 09/02/19 0422 09/03/19 0908  NA 137   < > 136 134* 135 136 140  K 3.6   < > 3.8 4.4 3.2* 3.9 2.5*  CL 106   < > 105 104 103 107 108  CO2 21*   < > 19* 20* 22 23 23   GLUCOSE 63*   < > 59* 86 74 94 128*  BUN 7*   < > 8 5* <5* <5* 5*  CREATININE 1.14   < > 1.12 1.04 0.93 0.83 0.79  CALCIUM 7.9*   < > 8.0* 7.8* 7.7* 7.8* 7.8*  MG 1.4*   < > 1.5* 1.3* 2.0 1.5* 1.6*  PHOS 2.8  --  2.3* 3.4  --   --   --    < > = values in this interval not displayed.   Liver Function Tests: Recent Labs  Lab 08/28/19 0200 08/29/19  2395 08/30/19 0247 08/31/19 0312  AST 58* 29 20 15   ALT 24 19 16 11   ALKPHOS 87 84 87 69  BILITOT 0.4 0.6 1.1 1.1  PROT 5.3* 5.0* 5.3* 4.4*  ALBUMIN 2.3* 2.2* 2.2* 1.8*   No results for input(s): LIPASE, AMYLASE in the last 168 hours. No results for input(s): AMMONIA in the last 168 hours. Cardiac Enzymes: No results for input(s): CKTOTAL, CKMB, CKMBINDEX, TROPONINI in the last 168 hours. BNP (last 3 results) Recent Labs    07/25/19 0246 07/26/19 0226  BNP 54.0 95.9    ProBNP (last 3 results) No results for input(s): PROBNP in the last 8760 hours.  CBG: Recent Labs  Lab 08/27/19 1231 08/30/19 0822 08/30/19 0848  GLUCAP 126* 53* 193*   Recent Results (from the past 240 hour(s))  SARS Coronavirus 2 by RT PCR (hospital order, performed in [facility] hospital lab) Nasopharyngeal Nasopharyngeal Swab     Status: None   Collection Time: 08/27/19 11:12 AM   Specimen: Nasopharyngeal Swab  Result Value Ref Range Status   SARS Coronavirus 2 NEGATIVE NEGATIVE Final    Comment: (NOTE) SARS-CoV-2 target nucleic acids are NOT DETECTED.  The SARS-CoV-2 RNA is generally detectable in upper and lower respiratory specimens during the  acute phase of infection. The lowest concentration of SARS-CoV-2 viral copies this assay can detect is 250 copies / mL. A negative result does not preclude SARS-CoV-2 infection and should not be used as the sole basis for treatment or other patient management decisions.  A negative result may occur with improper specimen collection / handling, submission of specimen other than nasopharyngeal swab, presence of viral mutation(s) within the areas targeted by this assay, and inadequate number of viral copies (<250 copies / mL). A negative result must be combined with clinical observations, patient history, and epidemiological information.  Fact Sheet for Patients:   StrictlyIdeas.no  Fact Sheet for Healthcare Providers: BankingDealers.co.za  This test is not yet approved or  cleared by the [place] FDA and has been authorized for detection and/or diagnosis of SARS-CoV-2 by FDA under an Emergency Use Authorization (EUA).  This EUA will remain in effect (meaning this test can be used) for the duration of the COVID-19 declaration under Section 564(b)(1) of the Act, 21 U.S.C. section 360bbb-3(b)(1), unless the authorization is terminated or revoked sooner.  Performed at [facility], [place] [address], [place], [place] [address]      Studies: No results found. Total time spent: 27 minutes  Darliss Cheney, MD  [facility] 09/03/2019

## 2019-09-03 NOTE — Progress Notes (Signed)
4 Days Post-Op  Subjective: NAEO. No new complaints. Tolerating PO without N/V. Having ostomy output. Still requires on going ostomy education and is not comfortable managing it alone.  ROS: See above, otherwise other systems negative  Objective: Vital signs in last 24 hours: Temp:  [97.5 F (36.4 C)-97.8 F (36.6 C)] 97.5 F (36.4 C) (08/21 0348) Pulse Rate:  [76-89] 89 (08/21 0839) Resp:  [14-17] 17 (08/21 0839) BP: (102-128)/(68-79) 126/76 (08/21 0839) SpO2:  [99 %-100 %] 100 % (08/21 0839) Last BM Date: 09/03/19  Intake/Output from previous day: 08/20 0701 - 08/21 0700 In: 675 [I.V.:675] Out: 2975 [Urine:1375; Stool:1600] Intake/Output this shift: No intake/output data recorded.  PE: Abd: soft, appropriately tender, +BS, ND, incision is c/d/i with staples present- lower aspect of incision has been contaminated by a small amt stool. There is no cellulitis, there is a very small amt erythema proximal aspect of incision at site of staple-insertion. Ostomy is viable and working well currently (1400cc stool/24h, watery)  Lab Results:  Recent Labs    09/01/19 0251 09/02/19 0422  WBC 8.8 6.6  HGB 9.3* 9.9*  HCT 27.0* 29.1*  PLT 233 237   BMET Recent Labs    09/01/19 0251 09/02/19 0422  NA 135 136  K 3.2* 3.9  CL 103 107  CO2 22 23  GLUCOSE 74 94  BUN <5* <5*  CREATININE 0.93 0.83  CALCIUM 7.7* 7.8*   PT/INR No results for input(s): LABPROT, INR in the last 72 hours. CMP     Component Value Date/Time   NA 136 09/02/2019 0422   K 3.9 09/02/2019 0422   CL 107 09/02/2019 0422   CO2 23 09/02/2019 0422   GLUCOSE 94 09/02/2019 0422   GLUCOSE 118 (H) 12/08/2005 0741   BUN <5 (L) 09/02/2019 0422   CREATININE 0.83 09/02/2019 0422   CREATININE 1.11 04/02/2015 1514   CALCIUM 7.8 (L) 09/02/2019 0422   PROT 4.4 (L) 08/31/2019 0312   PROT 6.6 03/26/2016 0736   ALBUMIN 1.8 (L) 08/31/2019 0312   ALBUMIN 4.0 03/26/2016 0736   AST 15 08/31/2019 0312   ALT 11  08/31/2019 0312   ALKPHOS 69 08/31/2019 0312   BILITOT 1.1 08/31/2019 0312   BILITOT 0.3 03/26/2016 0736   GFRNONAA >60 09/02/2019 0422   GFRAA >60 09/02/2019 0422   Lipase     Component Value Date/Time   LIPASE 18 08/10/2019 1936       Studies/Results: No results found.  Anti-infectives: Anti-infectives (From admission, onward)   Start     Dose/Rate Route Frequency Ordered Stop   08/27/19 2100  piperacillin-tazobactam (ZOSYN) IVPB 3.375 g  Status:  Discontinued       "Followed by" Linked Group Details   3.375 g 12.5 mL/hr over 240 Minutes Intravenous Every 8 hours 08/27/19 1448 09/01/19 0805   08/27/19 1500  piperacillin-tazobactam (ZOSYN) IVPB 3.375 g       "Followed by" Linked Group Details   3.375 g 100 mL/hr over 30 Minutes Intravenous  Once 08/27/19 1448 08/27/19 1752       Assessment/Plan HTN HLD CADs/p PCI 2001 and 2016- hold plavix.  ECHO7/12 shows EF 70-75%. Cardiology reports patient is moderate cardiovascular risk DM - A1c 6.2 OSA AKI -resolved, IVFs  POD 4, s/p ex lap with sigmoid colectomy, colostomy for diverticular stricture, Dr. Kae Heller, 8/17 - adv diet to [facility] -mobilize -[place] team is following.  Patient needs more education before feeling comfortable going home with this.  He lives by  himself. WOC RN to see him monday -[facility] RN ordered for routine colostomy care at home.  He is enrolled in the [place] [facility] arranging this. -monitor wound closely as he has feculent contamination in PACU from colostomy pouch leaking and again on the floor. I discussed this with patients RN  FEN: advance to heart healthy diet VTE: SCDs, lovenox ID: Zosyn 8/14>>09/01/19   LOS: 7 days    Jill Alexanders , [facility] 09/03/2019, 9:46 AM Please see Amion for pager number during day hours 7:00am-4:30pm or 7:00am -11:30am on weekends

## 2019-09-03 NOTE — Plan of Care (Signed)

## 2019-09-03 NOTE — Plan of Care (Signed)
  Problem: Pain Managment: Goal: General experience of comfort will improve Outcome: Progressing   Problem: Safety: Goal: Ability to remain free from injury will improve Outcome: Progressing   Problem: Skin Integrity: Goal: Risk for impaired skin integrity will decrease Outcome: Progressing   

## 2019-09-04 LAB — CBC WITH DIFFERENTIAL/PLATELET
Abs Immature Granulocytes: 0.08 10*3/uL — ABNORMAL HIGH (ref 0.00–0.07)
Basophils Absolute: 0 10*3/uL (ref 0.0–0.1)
Basophils Relative: 1 %
Eosinophils Absolute: 0.3 10*3/uL (ref 0.0–0.5)
Eosinophils Relative: 5 %
HCT: 29.6 % — ABNORMAL LOW (ref 39.0–52.0)
Hemoglobin: 10 g/dL — ABNORMAL LOW (ref 13.0–17.0)
Immature Granulocytes: 1 %
Lymphocytes Relative: 25 %
Lymphs Abs: 1.5 10*3/uL (ref 0.7–4.0)
MCH: 31.2 pg (ref 26.0–34.0)
MCHC: 33.8 g/dL (ref 30.0–36.0)
MCV: 92.2 fL (ref 80.0–100.0)
Monocytes Absolute: 0.5 10*3/uL (ref 0.1–1.0)
Monocytes Relative: 8 %
Neutro Abs: 3.6 10*3/uL (ref 1.7–7.7)
Neutrophils Relative %: 60 %
Platelets: 252 10*3/uL (ref 150–400)
RBC: 3.21 MIL/uL — ABNORMAL LOW (ref 4.22–5.81)
RDW: 15.3 % (ref 11.5–15.5)
WBC: 5.9 10*3/uL (ref 4.0–10.5)
nRBC: 0.3 % — ABNORMAL HIGH (ref 0.0–0.2)

## 2019-09-04 LAB — BASIC METABOLIC PANEL
Anion gap: 9 (ref 5–15)
BUN: 5 mg/dL — ABNORMAL LOW (ref 8–23)
CO2: 23 mmol/L (ref 22–32)
Calcium: 8.2 mg/dL — ABNORMAL LOW (ref 8.9–10.3)
Chloride: 109 mmol/L (ref 98–111)
Creatinine, Ser: 0.77 mg/dL (ref 0.61–1.24)
GFR calc Af Amer: >60 mL/min (ref >60)
GFR calc non Af Amer: >60 mL/min (ref >60)
Glucose, Bld: 110 mg/dL — ABNORMAL HIGH (ref 70–99)
Potassium: 3.6 mmol/L (ref 3.5–5.1)
Sodium: 141 mmol/L (ref 135–145)

## 2019-09-04 LAB — MAGNESIUM: Magnesium: 1.6 mg/dL — ABNORMAL LOW (ref 1.7–2.4)

## 2019-09-04 MED ORDER — ENOXAPARIN SODIUM 40 MG/0.4ML ~~LOC~~ SOLN: 40.0000 mg | Freq: Two times a day (BID) | SUBCUTANEOUS | Status: DC

## 2019-09-04 MED ORDER — ENOXAPARIN SODIUM 30 MG/0.3ML ~~LOC~~ SOLN
30.0000 mg | Freq: Two times a day (BID) | SUBCUTANEOUS | Status: DC
  Administered 2019-09-04 – 2019-09-06 (×5): 30 mg via SUBCUTANEOUS
  Filled 2019-09-04 (×5): qty 0.3

## 2019-09-04 MED ORDER — MAGNESIUM SULFATE 2 GM/50ML IV SOLN
2.0000 g | Freq: Once | INTRAVENOUS | Status: AC
  Administered 2019-09-04: 2 g via INTRAVENOUS
  Filled 2019-09-04: qty 50

## 2019-09-04 MED ORDER — POTASSIUM CHLORIDE CRYS ER 20 MEQ PO TBCR
40.0000 meq | EXTENDED_RELEASE_TABLET | Freq: Once | ORAL | Status: AC
  Administered 2019-09-04: 40 meq via ORAL
  Filled 2019-09-04: qty 2

## 2019-09-04 NOTE — Progress Notes (Signed)
Pt ostomy changed and pt educated during the process, tolerated well, will continue to monitor.

## 2019-09-04 NOTE — Progress Notes (Signed)
PROGRESS NOTE  Roy [name] VQM:[id number] DOB: Jun 01, 1945 DOA: 08/26/2019 PCP: Jinny Sanders, MD   LOS: 8 days   Brief narrative: As per HPI,  Roy [name] is a 74 y.o. male with medical history significant for coronary artery disease status post stent angioplasty, hypertension, depression, obstructive sleep apnea  presented to the emergency room with complaints of abdominal pain intermittently for 1 month.  Patient was recently admitted to the hospital and at that time he had a colonoscopy and was told he had a bowel obstruction.  His GI pathogen panel was positive for Yersinia and he was treated with doxycycline. since his discharge he has continued to have intermittent periumbilical pain with worsening appetite and weight loss.  He did follow-up with GI after discharge.  In the ED, his labs revealed sodium level of 135, potassium of 4.6, bicarb of 20, lactic acid of 2, white count of 7.2, hemoglobin of 14.3. CT scan of abdomen pelvis showed findings most consistent with sigmoid diverticulitis resulting in obstruction of the proximal large and small bowel. The degree of obstruction has increased since 08/10/2019. Biopsy results from recent colonoscopy did not demonstrate malignancy or active inflammation.  Patient was then admitted to hospital for further evaluation and treatment of acute small and large bowel obstruction and acute diverticulitis.  He was started on broad-spectrum antibiotics and conservative management with NG tube and IV fluids and was kept NPO. Patient had barium enema done which showed irregular narrowing at the sigmoid colon region.  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  Assessment/Plan:  Principal Problem:   Large bowel obstruction (HCC) Active Problems:   Depression, major, recurrent ([place])   Essential hypertension   Coronary artery disease involving native coronary artery of native  heart without angina pectoris   Diverticulitis  Acute small and large bowel obstruction/acute diverticulitis: Patient was initially on conservative treatment with NG tube, IV fluids n.p.o. status.    Patient had barium enema done which showed irregular narrowing at the sigmoid colon region.  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  Doing fine today.  Pain improved.  Tolerating clears.  Advance to full liquid diet.Marland Kitchen  Antibiotics discontinued by surgery on 08/31/2019.  He is getting education on how to manage his colostomy bag.  He still does not feel comfortable doing it by himself.  He has leaking ostomy pouch.  RN was advised by general surgery to change that.  He will be educated 1 more time by wound care nurse tomorrow.Marland Kitchen  He is tolerating regular diet.  He tells me that he will see tomorrow how he feels after education and if he feels like going home or not.  Essential hypertension: Controlled.  Continue carvedilol.  Hypomagnesemia.  Low again today.  Will replace again today.  Recheck in the morning.  Hypophosphatemia.  Resolved.  Hypokalemia: Finally resolved and 3.6.  We will give him another dose of 40 mEq today to prevent hypokalemia.  DVT prophylaxis: enoxaparin (LOVENOX) injection 30 mg Start: 09/04/19 0900 SCDs Start: 08/27/19 1433    Code Status: DNR  Family Communication: None present at bedside.  Plan of care discussed with patient.  Status is: Inpatient  Remains inpatient appropriate because:IV treatments appropriate due to intensity of illness or inability to take PO, Inpatient level of care appropriate due to severity of illness    Dispo: The patient is from: Home  Anticipated d/c is to: Home               Anticipated d/c date is: 09/05/2019              Patient currently is medically stable to d/c.   Consultants:  General surgery  GI  Procedures:  NG tube placement sigmoid  colectomy with end colostomy on 08/30/2019  Antibiotics:   Zosyn IV 08/27/2019> 08/31/2019  Anti-infectives (From admission, onward)   Start     Dose/Rate Route Frequency Ordered Stop   08/27/19 2100  piperacillin-tazobactam (ZOSYN) IVPB 3.375 g  Status:  Discontinued       "Followed by" Linked Group Details   3.375 g 12.5 mL/hr over 240 Minutes Intravenous Every 8 hours 08/27/19 1448 09/01/19 0805   08/27/19 1500  piperacillin-tazobactam (ZOSYN) IVPB 3.375 g       "Followed by" Linked Group Details   3.375 g 100 mL/hr over 30 Minutes Intravenous  Once 08/27/19 1448 08/27/19 1752     Subjective: Patient seen and examined.  He feels better with no much pain.  Tolerating diet but not eating much.  His ostomy is leaking.  He tells me that he is not comfortable going home until he is fully trained and confident on ostomy care tomorrow.  Objective: Vitals:   09/04/19 0341 09/04/19 0823  BP: 135/85 (!) 147/93  Pulse: 67 70  Resp: 16 17  Temp: 97.9 F (36.6 C) 98.1 F (36.7 C)  SpO2: 98% 96%    Intake/Output Summary (Last 24 hours) at 09/04/2019 1126 Last data filed at 09/04/2019 1007 Gross per 24 hour  Intake 1417.25 ml  Output 2775 ml  Net -1357.75 ml   Filed Weights   08/26/19 1716 08/26/19 1759  Weight: 72 kg 68 kg   Body mass index is 24.21 kg/m.   Physical Exam:  General exam: Appears calm and comfortable  Respiratory system: Clear to auscultation. Respiratory effort normal. Cardiovascular system: S1 & S2 heard, RRR. No JVD, murmurs, rubs, gallops or clicks. No pedal edema. Gastrointestinal system: Abdomen is nondistended, soft and nontender. No organomegaly or masses felt. Normal bowel sounds heard.  Ostomy bag is leaking with some liquid stool on his abdomen.  Some erythema around incision. Central nervous system: Alert and oriented. No focal neurological deficits. Extremities: Symmetric 5 x 5 power. Skin: No rashes, lesions or ulcers.  Psychiatry: Judgement and  insight appear normal. Mood & affect appropriate.    Data Review: I have personally reviewed the following laboratory data and studies,  CBC: Recent Labs  Lab 08/30/19 0247 08/31/19 0312 09/01/19 0251 09/02/19 0422 09/04/19 0843  WBC 9.6 10.8* 8.8 6.6 5.9  NEUTROABS  --   --   --   --  3.6  HGB 12.6* 11.0* 9.3* 9.9* 10.0*  HCT 38.1* 33.0* 27.0* 29.1* 29.6*  MCV 90.9 89.4 89.7 89.8 92.2  PLT 303 307 233 237 254   Basic Metabolic Panel: Recent Labs  Lab 08/29/19 0057 08/29/19 0057 08/30/19 0247 08/30/19 0247 08/31/19 0312 09/01/19 0251 09/02/19 0422 09/03/19 0908 09/04/19 0843  NA 137   < > 136   < > 134* 135 136 140 141  K 3.6   < > 3.8   < > 4.4 3.2* 3.9 2.5* 3.6  CL 106   < > 105   < > 104 103 107 108 109  CO2 21*   < > 19*   < > 20* 22 23 23 23   GLUCOSE 63*   < >  59*   < > 86 74 94 128* 110*  BUN 7*   < > 8   < > 5* <5* <5* 5* 5*  CREATININE 1.14   < > 1.12   < > 1.04 0.93 0.83 0.79 0.77  CALCIUM 7.9*   < > 8.0*   < > 7.8* 7.7* 7.8* 7.8* 8.2*  MG 1.4*   < > 1.5*   < > 1.3* 2.0 1.5* 1.6* 1.6*  PHOS 2.8  --  2.3*  --  3.4  --   --   --   --    < > = values in this interval not displayed.   Liver Function Tests: Recent Labs  Lab 08/29/19 0057 08/30/19 0247 08/31/19 0312  AST 29 20 15   ALT 19 16 11   ALKPHOS 84 87 69  BILITOT 0.6 1.1 1.1  PROT 5.0* 5.3* 4.4*  ALBUMIN 2.2* 2.2* 1.8*   No results for input(s): LIPASE, AMYLASE in the last 168 hours. No results for input(s): AMMONIA in the last 168 hours. Cardiac Enzymes: No results for input(s): CKTOTAL, CKMB, CKMBINDEX, TROPONINI in the last 168 hours. BNP (last 3 results) Recent Labs    07/25/19 0246 07/26/19 0226  BNP 54.0 95.9    ProBNP (last 3 results) No results for input(s): PROBNP in the last 8760 hours.  CBG: Recent Labs  Lab 08/30/19 0822 08/30/19 0848  GLUCAP 53* 193*   Recent Results (from the past 240 hour(s))  SARS Coronavirus 2 by RT PCR (hospital order, performed in [facility]  hospital lab) Nasopharyngeal Nasopharyngeal Swab     Status: None   Collection Time: 08/27/19 11:12 AM   Specimen: Nasopharyngeal Swab  Result Value Ref Range Status   SARS Coronavirus 2 NEGATIVE NEGATIVE Final    Comment: (NOTE) SARS-CoV-2 target nucleic acids are NOT DETECTED.  The SARS-CoV-2 RNA is generally detectable in upper and lower respiratory specimens during the acute phase of infection. The lowest concentration of SARS-CoV-2 viral copies this assay can detect is 250 copies / mL. A negative result does not preclude SARS-CoV-2 infection and should not be used as the sole basis for treatment or other patient management decisions.  A negative result may occur with improper specimen collection / handling, submission of specimen other than nasopharyngeal swab, presence of viral mutation(s) within the areas targeted by this assay, and inadequate number of viral copies (<250 copies / mL). A negative result must be combined with clinical observations, patient history, and epidemiological information.  Fact Sheet for Patients:   StrictlyIdeas.no  Fact Sheet for Healthcare Providers: BankingDealers.co.za  This test is not yet approved or  cleared by the [place] FDA and has been authorized for detection and/or diagnosis of SARS-CoV-2 by FDA under an Emergency Use Authorization (EUA).  This EUA will remain in effect (meaning this test can be used) for the duration of the COVID-19 declaration under Section 564(b)(1) of the Act, 21 U.S.C. section 360bbb-3(b)(1), unless the authorization is terminated or revoked sooner.  Performed at [facility], [place] [address] [place], [place] [address]      Studies: No results found. Total time spent: 28 minutes  Darliss Cheney, MD  [facility] 09/04/2019

## 2019-09-04 NOTE — Progress Notes (Signed)
5 Days Post-Op  Subjective: NAEO. No new complaints. Tolerating PO without N/V. Not eating much but is also drinking ensure/boost. Having ostomy output. Still requires on going ostomy education and is not comfortable managing it alone but has been observing the nursing staff and is very willing to participate.   ROS: See above, otherwise other systems negative  Objective: Vital signs in last 24 hours: Temp:  [97.5 F (36.4 C)-98.7 F (37.1 C)] 98.1 F (36.7 C) (08/22 0823) Pulse Rate:  [67-88] 70 (08/22 0823) Resp:  [16-18] 17 (08/22 0823) BP: (102-147)/(69-93) 147/93 (08/22 0823) SpO2:  [96 %-100 %] 96 % (08/22 0823) Last BM Date: 09/03/19  Intake/Output from previous day: 08/21 0701 - 08/22 0700 In: 1417.3 [I.V.:1367.3; IV Piggyback:50] Out: 2225 [Urine:925; Stool:1300] Intake/Output this shift: No intake/output data recorded.  PE: Abd: soft, appropriately tender, +BS, ND, incision is c/d/i with staples present- central aspect of incision has been contaminated by a small amt stool from pouch leaking. there is a very small amt erythema proximal aspect of incision at site of staple-insertion. Ostomy is viable and working well currently, was just emptied.   Lab Results:  Recent Labs    09/02/19 0422  WBC 6.6  HGB 9.9*  HCT 29.1*  PLT 237   BMET Recent Labs    09/02/19 0422 09/03/19 0908  NA 136 140  K 3.9 2.5*  CL 107 108  CO2 23 23  GLUCOSE 94 128*  BUN <5* 5*  CREATININE 0.83 0.79  CALCIUM 7.8* 7.8*   PT/INR No results for input(s): LABPROT, INR in the last 72 hours. CMP     Component Value Date/Time   NA 140 09/03/2019 0908   K 2.5 (LL) 09/03/2019 0908   CL 108 09/03/2019 0908   CO2 23 09/03/2019 0908   GLUCOSE 128 (H) 09/03/2019 0908   GLUCOSE 118 (H) 12/08/2005 0741   BUN 5 (L) 09/03/2019 0908   CREATININE 0.79 09/03/2019 0908   CREATININE 1.11 04/02/2015 1514   CALCIUM 7.8 (L) 09/03/2019 0908   PROT 4.4 (L) 08/31/2019 0312   PROT 6.6  03/26/2016 0736   ALBUMIN 1.8 (L) 08/31/2019 0312   ALBUMIN 4.0 03/26/2016 0736   AST 15 08/31/2019 0312   ALT 11 08/31/2019 0312   ALKPHOS 69 08/31/2019 0312   BILITOT 1.1 08/31/2019 0312   BILITOT 0.3 03/26/2016 0736   GFRNONAA >60 09/03/2019 0908   GFRAA >60 09/03/2019 0908   Lipase     Component Value Date/Time   LIPASE 18 08/10/2019 1936       Studies/Results: No results found.  Anti-infectives: Anti-infectives (From admission, onward)   Start     Dose/Rate Route Frequency Ordered Stop   08/27/19 2100  piperacillin-tazobactam (ZOSYN) IVPB 3.375 g  Status:  Discontinued       "Followed by" Linked Group Details   3.375 g 12.5 mL/hr over 240 Minutes Intravenous Every 8 hours 08/27/19 1448 09/01/19 0805   08/27/19 1500  piperacillin-tazobactam (ZOSYN) IVPB 3.375 g       "Followed by" Linked Group Details   3.375 g 100 mL/hr over 30 Minutes Intravenous  Once 08/27/19 1448 08/27/19 1752       Assessment/Plan HTN HLD CADs/p PCI 2001 and 2016- hold plavix.  ECHO7/12 shows EF 70-75%. Cardiology reports patient is moderate cardiovascular risk DM - A1c 6.2 OSA AKI -resolved, IVFs  POD 5, s/p ex lap with sigmoid colectomy, colostomy for diverticular stricture, Dr. Kae Heller, 8/17 - continue HH diet +ensure, tolerating  PO but not eating much -mobilize -WOC team is following.  Patient needs more education before feeling comfortable going home with this.  He lives by himself. WOC RN to see him monday -[facility] RN ordered for routine colostomy care at home.  He is enrolled in the [place] [facility] arranging this. -monitor wound closely as he has feculent contamination in PACU from colostomy pouch leaking and again on the floor. I discussed this with patients RN  FEN: advance to heart healthy diet VTE: SCDs, lovenox ID: Zosyn 8/14>>09/01/19  Plan: RN to change ostomy pouch today, as it is leaking. Work with [facility] RN tomorrow. Anticipate  discharge in 24 hours.    LOS: 8 days    [facility] 09/04/2019, 9:07 AM Please see Amion for pager number during day hours 7:00am-4:30pm or 7:00am -11:30am on weekends

## 2019-09-04 NOTE — Plan of Care (Signed)
  Problem: Pain Managment: Goal: General experience of comfort will improve Outcome: Progressing   Problem: Safety: Goal: Ability to remain free from injury will improve Outcome: Progressing   Problem: Skin Integrity: Goal: Risk for impaired skin integrity will decrease Outcome: Progressing   

## 2019-09-04 NOTE — Plan of Care (Signed)

## 2019-09-04 NOTE — Progress Notes (Signed)
CRITICAL VALUE ALERT  Critical Value:  Potassium 2.5   Date & Time Notied:  09/03/19 0908  Provider Notified: pahwani  Orders Received/Actions taken: see new orders.

## 2019-09-05 LAB — BASIC METABOLIC PANEL
Anion gap: 9 (ref 5–15)
BUN: 6 mg/dL — ABNORMAL LOW (ref 8–23)
CO2: 24 mmol/L (ref 22–32)
Calcium: 8.1 mg/dL — ABNORMAL LOW (ref 8.9–10.3)
Chloride: 110 mmol/L (ref 98–111)
Creatinine, Ser: 0.7 mg/dL (ref 0.61–1.24)
GFR calc Af Amer: >60 mL/min (ref >60)
GFR calc non Af Amer: >60 mL/min (ref >60)
Glucose, Bld: 97 mg/dL (ref 70–99)
Potassium: 3.1 mmol/L — ABNORMAL LOW (ref 3.5–5.1)
Sodium: 143 mmol/L (ref 135–145)

## 2019-09-05 LAB — MAGNESIUM: Magnesium: 1.7 mg/dL (ref 1.7–2.4)

## 2019-09-05 MED ORDER — LOPERAMIDE HCL 2 MG PO CAPS
2.0000 mg | ORAL_CAPSULE | Freq: Two times a day (BID) | ORAL | Status: DC
  Administered 2019-09-05 – 2019-09-06 (×3): 2 mg via ORAL
  Filled 2019-09-05 (×3): qty 1

## 2019-09-05 MED ORDER — POTASSIUM CHLORIDE CRYS ER 20 MEQ PO TBCR
40.0000 meq | EXTENDED_RELEASE_TABLET | ORAL | Status: AC
  Administered 2019-09-05 (×2): 40 meq via ORAL
  Filled 2019-09-05 (×2): qty 2

## 2019-09-05 MED ORDER — CALCIUM POLYCARBOPHIL 625 MG PO TABS
625.0000 mg | ORAL_TABLET | Freq: Every day | ORAL | Status: DC
  Administered 2019-09-05 – 2019-09-06 (×2): 625 mg via ORAL
  Filled 2019-09-05 (×2): qty 1

## 2019-09-05 MED ORDER — PANTOPRAZOLE SODIUM 40 MG PO TBEC
40.0000 mg | DELAYED_RELEASE_TABLET | Freq: Every day | ORAL | Status: DC
  Administered 2019-09-05: 40 mg via ORAL
  Filled 2019-09-05: qty 1

## 2019-09-05 NOTE — Progress Notes (Signed)
Occupational Therapy Treatment Patient Details Name: Roy [name] MRN: [id number] DOB: 12-30-1945 Today's Date: 09/05/2019    History of present illness Pt is a 74 y.o. male admitted 08/26/19 with abdominal pain. Imaging suggestive of diverticulitis with a bowel obstruction. 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 PMH includes HTN, CAD, depression. Of note, pt with a couple recent admissions with similar symptoms.   OT comments  This 74 yo male admitted and underwent above presents to acute OT at an overall S level today for tasks in his room and in gym (where he stepped into and out of tub). He needs to be at an independent to Mod I to return home alone. He will continue to benefit from acute OT without need for followup.  Follow Up Recommendations  No OT follow up    Equipment Recommendations  None recommended by OT       Precautions / Restrictions Precautions Precautions: None Precaution Comments: Abdominal precautions for comfort s/p colostomy, HOH Restrictions Weight Bearing Restrictions: No       Mobility Bed Mobility Overal bed mobility: Needs Assistance Bed Mobility: Supine to Sit     Supine to sit: Supervision;HOB elevated     General bed mobility comments: pt in recliner upon arrival  Transfers Overall transfer level: Needs assistance Equipment used: None Transfers: Sit to/from Stand Sit to Stand: Supervision         General transfer comment: S to ambulate from room to gym and back (200 feet)    Balance Overall balance assessment: No apparent balance deficits (not formally assessed)   Sitting balance-Leahy Scale: Good       Standing balance-Leahy Scale: Fair Standing balance comment: ambulates without support (see below)             High level balance activites: Direction changes;Head turns;Turns High Level Balance Comments: mild deviation to gait trajectory with horizontal and vertical head turns, taking large step over object.            ADL either performed or assessed with clinical judgement   ADL Overall ADL's : Needs assistance/impaired                     Lower Body Dressing: Supervision/safety;Set up;Sit to/from stand   Toilet Transfer: Copy Details (indicate cue type and reason): recliner>ambulate to gym>back to room to sit in recliner     Tub/ Shower Transfer: Tub transfer;Supervision/safety;Ambulation Tub/Shower Transfer Details (indicate cue type and reason): held onto walls for in and out of tub. Discussed sit with him, but he politely declined.         Vision Baseline Vision/History: Wears glasses Wears Glasses: At all times            Cognition Arousal/Alertness: Awake/alert Behavior During Therapy: WFL for tasks assessed/performed Overall Cognitive Status: Within Functional Limits for tasks assessed                                                     Pertinent Vitals/ Pain       Pain Assessment: Faces Faces Pain Scale: Hurts a little bit Pain Location: Abdomen--twinges with doffing and donning socks while legs crossed Pain Descriptors / Indicators: Discomfort Pain Intervention(s): Limited activity within patient's tolerance;Monitored during session         Frequency  Min 2X/week  Progress Toward Goals  OT Goals(current goals can now be found in the care plan section)  Progress towards OT goals: Progressing toward goals  Acute Rehab OT Goals Patient Stated Goal: to go home soon OT Goal Formulation: With patient Time For Goal Achievement: 09/14/19 Potential to Achieve Goals: Good  Plan Discharge plan remains appropriate       AM-PAC OT "6 Clicks" Daily Activity     Outcome Measure   Help from another person eating meals?: None Help from another person taking care of personal grooming?: A Little Help from another person toileting, which includes using toliet, bedpan, or urinal?: A Little Help  from another person bathing (including washing, rinsing, drying)?: A Little Help from another person to put on and taking off regular upper body clothing?: A Little Help from another person to put on and taking off regular lower body clothing?: A Little 6 Click Score: 19    End of Session Equipment Utilized During Treatment: Gait belt  OT Visit Diagnosis: Pain Pain - part of body:  (where incision is for colostomy)   Activity Tolerance Patient tolerated treatment well   Patient Left in chair;with call bell/phone within reach   Nurse Communication          Time: 1349-1410 OT Time Calculation (min): 21 min  Charges: OT General Charges $OT Visit: 1 Visit OT Treatments $Self Care/Home Management : 8-22 mins  [place], OTR/L Acute [employer] Pager [phone] Office [phone]      Almon Register 09/05/2019, 2:41 PM

## 2019-09-05 NOTE — Progress Notes (Signed)
PHARMACIST - PHYSICIAN COMMUNICATION  DR:   Doristine Bosworth  CONCERNING: IV to Oral Route Change Policy  RECOMMENDATION: This patient is receiving Protonix by the intravenous route.  Based on criteria approved by the Pharmacy and Therapeutics Committee, the intravenous medication(s) is/are being converted to the equivalent oral dose form(s).   DESCRIPTION: These criteria include:  The patient is eating (either orally or via tube) and/or has been taking other orally administered medications for a least 24 hours  The patient has no evidence of active gastrointestinal bleeding or impaired GI absorption (gastrectomy, short bowel, patient on TNA or NPO).  If you have questions about this conversion, please contact the Pharmacy Department  []   [phone] )  [facility] []   [phone] )  [facility] [x]   [phone] )  [facility] []   [phone] )  [facility] []   [phone] )  [place], [facility] 09/05/2019 10:35 AM

## 2019-09-05 NOTE — Progress Notes (Signed)
PROGRESS NOTE  Roy [name] QJF:[id number] DOB: [birth year] DOA: 08/26/2019 PCP: Jinny Sanders, MD   LOS: 9 days   Brief narrative: As per HPI,  Roy [name] is a 74 y.o. male with medical history significant for coronary artery disease status post stent angioplasty, hypertension, depression, obstructive sleep apnea  presented to the emergency room with complaints of abdominal pain intermittently for 1 month.  Patient was recently admitted to the hospital and at that time he had a colonoscopy and was told he had a bowel obstruction.  His GI pathogen panel was positive for Yersinia and he was treated with doxycycline. since his discharge he has continued to have intermittent periumbilical pain with worsening appetite and weight loss.  He did follow-up with GI after discharge.  In the ED, his labs revealed sodium level of 135, potassium of 4.6, bicarb of 20, lactic acid of 2, white count of 7.2, hemoglobin of 14.3. CT scan of abdomen pelvis showed findings most consistent with sigmoid diverticulitis resulting in obstruction of the proximal large and small bowel. The degree of obstruction has increased since 08/10/2019. Biopsy results from recent colonoscopy did not demonstrate malignancy or active inflammation.  Patient was then admitted to hospital for further evaluation and treatment of acute small and large bowel obstruction and acute diverticulitis.  He was started on broad-spectrum antibiotics and conservative management with NG tube and IV fluids and was kept NPO. Patient had barium enema done which showed irregular narrowing at the sigmoid colon region.  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  Assessment/Plan:  Principal Problem:   Large bowel obstruction (HCC) Active Problems:   Depression, major, recurrent ([place])   Essential hypertension   Coronary artery disease involving native coronary artery of native  heart without angina pectoris   Diverticulitis  Acute small and large bowel obstruction/acute diverticulitis: Patient was initially on conservative treatment with NG tube, IV fluids n.p.o. status.    Patient had barium enema done which showed irregular narrowing at the sigmoid colon region.  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  Doing fine today.  Pain improved.  Tolerating regular diet.  Antibiotics discontinued by surgery on 08/31/2019.  He has now developed some erythema around staples which is getting worse.  Has intermittent leakage as well.  He is very unhappy about this and many other complaint is that he has.  He tells me that PT never walked him to the steps and he has 8 steps to get into the home and is not comfortable going home until PT works with him on that.  He is also not comfortable with taking care of his ostomy and wants more education and training on that.  I did refer him to discuss this situation with general surgery.  Patient mainly here with surgery issues.  Not much medically going on.  Discussed with surgery team and requested assuming patient's care as primary team but they declined this request.  We will keep him under medical team (although not much going on medically) for another day.  If not discharged tomorrow, will request surgery again to assume care.  Essential hypertension: Controlled.  Continue carvedilol.  Hypomagnesemia.  Normal  Hypophosphatemia.  Resolved.  Hypokalemia: Low again.  Will replace orally.  DVT prophylaxis: enoxaparin (LOVENOX) injection 30 mg Start: 09/04/19 0900 SCDs Start: 08/27/19 1433    Code Status: DNR  Family Communication: None present at  bedside.  Plan of care discussed with patient.  Status is: Inpatient  Remains inpatient appropriate because:IV treatments appropriate due to intensity of illness or inability to take PO, Inpatient level of care  appropriate due to severity of illness    Dispo: The patient is from: Home              Anticipated d/c is to: Home               Anticipated d/c date is: 09/06/2019              Patient currently is medically stable to d/c.   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 Zosyn IV 08/27/2019> 08/31/2019  Anti-infectives (From admission, onward)   Start     Dose/Rate Route Frequency Ordered Stop   08/27/19 2100  piperacillin-tazobactam (ZOSYN) IVPB 3.375 g  Status:  Discontinued       "Followed by" Linked Group Details   3.375 g 12.5 mL/hr over 240 Minutes Intravenous Every 8 hours 08/27/19 1448 09/01/19 0805   08/27/19 1500  piperacillin-tazobactam (ZOSYN) IVPB 3.375 g       "Followed by" Linked Group Details   3.375 g 100 mL/hr over 30 Minutes Intravenous  Once 08/27/19 1448 08/27/19 1752     Subjective: Seen and examined.  Tolerating diet.  Has no pain but has several other complaints such as, he is not comfortable taking care of ostomy yet and is wanting more training.  Want physical therapy to work with him climbing steps.  Very apprehensive about going home.  Objective: Vitals:   09/05/19 1032 09/05/19 1038  BP: (!) 145/80 (!) 145/80  Pulse: (!) 101 (!) 105  Resp:  16  Temp:  98 F (36.7 C)  SpO2:  98%    Intake/Output Summary (Last 24 hours) at 09/05/2019 1240 Last data filed at 09/05/2019 0900 Gross per 24 hour  Intake 467.3 ml  Output 3050 ml  Net -2582.7 ml   Filed Weights   08/26/19 1716 08/26/19 1759  Weight: 72 kg 68 kg   Body mass index is 24.21 kg/m.   Physical Exam:  General exam: Appears calm and comfortable  Respiratory system: Clear to auscultation. Respiratory effort normal. Cardiovascular system: S1 & S2 heard, RRR. No JVD, murmurs, rubs, gallops or clicks. No pedal edema. Gastrointestinal system: Abdomen is nondistended, soft and nontender. No  organomegaly or masses felt. Normal bowel sounds heard.  Colostomy bag in place.  No leakage at this point in time.  Erythema around surgical incision/staples. Central nervous system: Alert and oriented. No focal neurological deficits. Extremities: Symmetric 5 x 5 power. Skin: No rashes, lesions or ulcers.  Psychiatry: Judgement and insight appear normal. Mood & affect appropriate.    Data Review: I have personally reviewed the following laboratory data and studies,  CBC: Recent Labs  Lab 08/30/19 0247 08/31/19 0312 09/01/19 0251 09/02/19 0422 09/04/19 0843  WBC 9.6 10.8* 8.8 6.6 5.9  NEUTROABS  --   --   --   --  3.6  HGB 12.6* 11.0* 9.3* 9.9* 10.0*  HCT 38.1* 33.0* 27.0* 29.1* 29.6*  MCV 90.9 89.4 89.7 89.8 92.2  PLT 303 307 233 237 706   Basic Metabolic Panel: Recent Labs  Lab 08/30/19 0247 08/30/19 0247 08/31/19 0312 08/31/19 0312 09/01/19 0251 09/02/19 0422 09/03/19 0908 09/04/19 0843 09/05/19 0523  NA 136   < > 134*   < >  135 136 140 141 143  K 3.8   < > 4.4   < > 3.2* 3.9 2.5* 3.6 3.1*  CL 105   < > 104   < > 103 107 108 109 110  CO2 19*   < > 20*   < > 22 23 23 23 24   GLUCOSE 59*   < > 86   < > 74 94 128* 110* 97  BUN 8   < > 5*   < > <5* <5* 5* 5* 6*  CREATININE 1.12   < > 1.04   < > 0.93 0.83 0.79 0.77 0.70  CALCIUM 8.0*   < > 7.8*   < > 7.7* 7.8* 7.8* 8.2* 8.1*  MG 1.5*   < > 1.3*   < > 2.0 1.5* 1.6* 1.6* 1.7  PHOS 2.3*  --  3.4  --   --   --   --   --   --    < > = values in this interval not displayed.   Liver Function Tests: Recent Labs  Lab 08/30/19 0247 08/31/19 0312  AST 20 15  ALT 16 11  ALKPHOS 87 69  BILITOT 1.1 1.1  PROT 5.3* 4.4*  ALBUMIN 2.2* 1.8*   No results for input(s): LIPASE, AMYLASE in the last 168 hours. No results for input(s): AMMONIA in the last 168 hours. Cardiac Enzymes: No results for input(s): CKTOTAL, CKMB, CKMBINDEX, TROPONINI in the last 168 hours. BNP (last 3 results) Recent Labs    07/25/19 0246  07/26/19 0226  BNP 54.0 95.9    ProBNP (last 3 results) No results for input(s): PROBNP in the last 8760 hours.  CBG: Recent Labs  Lab 08/30/19 0822 08/30/19 0848  GLUCAP 53* 193*   Recent Results (from the past 240 hour(s))  SARS Coronavirus 2 by RT PCR (hospital order, performed in [facility] hospital lab) Nasopharyngeal Nasopharyngeal Swab     Status: None   Collection Time: 08/27/19 11:12 AM   Specimen: Nasopharyngeal Swab  Result Value Ref Range Status   SARS Coronavirus 2 NEGATIVE NEGATIVE Final    Comment: (NOTE) SARS-CoV-2 target nucleic acids are NOT DETECTED.  The SARS-CoV-2 RNA is generally detectable in upper and lower respiratory specimens during the acute phase of infection. The lowest concentration of SARS-CoV-2 viral copies this assay can detect is 250 copies / mL. A negative result does not preclude SARS-CoV-2 infection and should not be used as the sole basis for treatment or other patient management decisions.  A negative result may occur with improper specimen collection / handling, submission of specimen other than nasopharyngeal swab, presence of viral mutation(s) within the areas targeted by this assay, and inadequate number of viral copies (<250 copies / mL). A negative result must be combined with clinical observations, patient history, and epidemiological information.  Fact Sheet for Patients:   StrictlyIdeas.no  Fact Sheet for Healthcare Providers: BankingDealers.co.za  This test is not yet approved or  cleared by the [place] FDA and has been authorized for detection and/or diagnosis of SARS-CoV-2 by FDA under an Emergency Use Authorization (EUA).  This EUA will remain in effect (meaning this test can be used) for the duration of the COVID-19 declaration under Section 564(b)(1) of the Act, 21 U.S.C. section 360bbb-3(b)(1), unless the authorization is terminated or revoked  sooner.  Performed at [facility], [place] [address], [place], [place] [address]      Studies: No results found. Total time spent: 30 minutes  Darliss Cheney, MD  [facility] 09/05/2019

## 2019-09-05 NOTE — Progress Notes (Signed)
Physical Therapy Treatment Patient Details Name: Roy [name] MRN: [id number] DOB: 15-Jun-1945 Today's Date: 09/05/2019    History of Present Illness Pt is a 74 y.o. male admitted 08/26/19 with abdominal pain. Imaging suggestive of diverticulitis with a bowel obstruction. 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 PMH includes HTN, CAD, depression. Of note, pt with a couple recent admissions with similar symptoms.    PT Comments    Pt is mobilizing very well post-operatively, requiring little to no cuing to safely perform mobility. Pt ambulated initially with use of RW, followed by 800 ft ambulation without AD. Pt ambulates well without AD, but requires close guard for challenges to dynamic standing balance and pt is noticably less comfortable when challenged. PT encouraged pt to continue to use RW for community or longer distance ambulation, but is appropriate to ambulate without RW in home. Pt proficiently navigated steps this day, multiple times. No further acute PT needs, and no follow up PT needed. Will sign off.     Follow Up Recommendations  No PT follow up     Equipment Recommendations  None recommended by PT    Recommendations for Other Services       Precautions / Restrictions Precautions Precautions: Fall Precaution Comments: Abdominal precautions for comfort s/p colostomy Restrictions Weight Bearing Restrictions: No    Mobility  Bed Mobility Overal bed mobility: Needs Assistance Bed Mobility: Supine to Sit     Supine to sit: Supervision;HOB elevated     General bed mobility comments: supervision, no physical assist with HOB elevated. PT reviewed the importance of rolling into and out of flat bed, pt expresses understanding.  Transfers Overall transfer level: Modified independent Equipment used: Rolling walker (2 wheeled) Transfers: Sit to/from Stand Sit to Stand: Modified independent (Device/Increase time)         General transfer comment: no  physical assist needed  Ambulation/Gait Ambulation/Gait assistance: Supervision;Modified independent (Device/Increase time) Gait Distance (Feet): 1200 Feet Assistive device: Rolling walker (2 wheeled) Gait Pattern/deviations: Step-through pattern;Decreased stride length Gait velocity: decr   General Gait Details: supervision for safety, first 400 ft with use of RW. Last 800 ft without RW use and no LOB, even when challenged. Verbal cuing for upright posture, as pt assumes forward flexed posture due to abdominal discomfort.   Stairs   Stairs assistance: Supervision Stair Management: One rail Left;Step to pattern;Alternating pattern;Forwards Number of Stairs: 3 (4x3 steps) General stair comments: multiple stair trials on 3-step staircase, verbal cuing for step-to during descending for pt safety and stability. Pt performs step-over-step navigation when ascending well   Wheelchair Mobility    Modified Rankin (Stroke Patients Only)       Balance Overall balance assessment: Needs assistance   Sitting balance-Leahy Scale: Good       Standing balance-Leahy Scale: Fair Standing balance comment: ambulates without support (see below)             High level balance activites: Direction changes;Head turns;Turns High Level Balance Comments: mild deviation to gait trajectory with horizontal and vertical head turns, taking large step over object.            Cognition Arousal/Alertness: Awake/alert Behavior During Therapy: WFL for tasks assessed/performed Overall Cognitive Status: Within Functional Limits for tasks assessed                                        Exercises  General Comments        Pertinent Vitals/Pain Pain Assessment: Faces Faces Pain Scale: No hurt Pain Location: Abdomen Pain Intervention(s): Monitored during session    Home Living                      Prior Function            PT Goals (current goals can now be  found in the care plan section) Acute Rehab PT Goals Patient Stated Goal: be able to manage at home PT Goal Formulation: With patient Time For Goal Achievement: 09/14/19 Potential to Achieve Goals: Good Progress towards PT goals: Goals met/education completed, patient discharged from PT    Frequency    Min 3X/week      PT Plan Current plan remains appropriate    Co-evaluation              AM-PAC PT "6 Clicks" Mobility   Outcome Measure  Help needed turning from your back to your side while in a flat bed without using bedrails?: None Help needed moving from lying on your back to sitting on the side of a flat bed without using bedrails?: None Help needed moving to and from a bed to a chair (including a wheelchair)?: None Help needed standing up from a chair using your arms (e.g., wheelchair or bedside chair)?: None Help needed to walk in hospital room?: None Help needed climbing 3-5 steps with a railing? : None 6 Click Score: 24    End of Session   Activity Tolerance: Patient tolerated treatment well Patient left: in chair;with call bell/phone within reach Nurse Communication: Mobility status PT Visit Diagnosis: Other abnormalities of gait and mobility (R26.89);Pain     Time: 1240-1304 PT Time Calculation (min) (ACUTE ONLY): 24 min  Charges:  $Gait Training: 8-22 mins $Neuromuscular Re-education: 8-22 mins                     Morine Kohlman E, PT Acute Rehabilitation Services Pager [phone]  Office [phone]    Raedyn Wenke D Vayden Weinand 09/05/2019, 1:26 PM

## 2019-09-05 NOTE — Progress Notes (Addendum)
6 Days Post-Op  Subjective: CC: Patient is doing well. No real abdominal pain except around midline when he gets out of bed to walk. Tolerating his diet without increased abdominal pain, n/v. Doesn't like the options. Worked with [facility] this morning for pouch emptying and feels comfortable with this. Has not done a pouch change. Mobilizing with walker. Voiding without difficulty.   Objective: Vital signs in last 24 hours: Temp:  [98 F (36.7 C)-98.7 F (37.1 C)] 98 F (36.7 C) (08/23 1038) Pulse Rate:  [81-105] 105 (08/23 1038) Resp:  [16-17] 16 (08/23 1038) BP: (126-145)/(70-87) 145/80 (08/23 1038) SpO2:  [97 %-100 %] 98 % (08/23 1038) Last BM Date: 09/04/19  Intake/Output from previous day: 08/22 0701 - 08/23 0700 In: 467.3 [I.V.:417.3; IV Piggyback:50] Out: 2850 [Urine:700; Stool:2150] Intake/Output this shift: Total I/O In: -  Out: 750 [Urine:600; Stool:150]  PE: Gen: Awake and alert, NAD Lungs: Normal rate and effort  Abd: Soft, ND, mild tenderness lateral to colostomy. No peritonitis. +BS. Colostomy bag with green liquidy stool in bag with some sediment. Stoma red, budded and appears healthy. Midline wound with some erythema and heat at the proximal aspect of the wound. There is some mild erythema around staples at the base of the wound that appears more likely 2/2 irritation.        Lab Results:  Recent Labs    09/04/19 0843  WBC 5.9  HGB 10.0*  HCT 29.6*  PLT 252   BMET Recent Labs    09/04/19 0843 09/05/19 0523  NA 141 143  K 3.6 3.1*  CL 109 110  CO2 23 24  GLUCOSE 110* 97  BUN 5* 6*  CREATININE 0.77 0.70  CALCIUM 8.2* 8.1*   PT/INR No results for input(s): LABPROT, INR in the last 72 hours. CMP     Component Value Date/Time   NA 143 09/05/2019 0523   K 3.1 (L) 09/05/2019 0523   CL 110 09/05/2019 0523   CO2 24 09/05/2019 0523   GLUCOSE 97 09/05/2019 0523   GLUCOSE 118 (H) 12/08/2005 0741   BUN 6 (L) 09/05/2019 0523   CREATININE 0.70  09/05/2019 0523   CREATININE 1.11 04/02/2015 1514   CALCIUM 8.1 (L) 09/05/2019 0523   PROT 4.4 (L) 08/31/2019 0312   PROT 6.6 03/26/2016 0736   ALBUMIN 1.8 (L) 08/31/2019 0312   ALBUMIN 4.0 03/26/2016 0736   AST 15 08/31/2019 0312   ALT 11 08/31/2019 0312   ALKPHOS 69 08/31/2019 0312   BILITOT 1.1 08/31/2019 0312   BILITOT 0.3 03/26/2016 0736   GFRNONAA >60 09/05/2019 0523   GFRAA >60 09/05/2019 0523   Lipase     Component Value Date/Time   LIPASE 18 08/10/2019 1936       Studies/Results: No results found.  Anti-infectives: Anti-infectives (From admission, onward)   Start     Dose/Rate Route Frequency Ordered Stop   08/27/19 2100  piperacillin-tazobactam (ZOSYN) IVPB 3.375 g  Status:  Discontinued       "Followed by" Linked Group Details   3.375 g 12.5 mL/hr over 240 Minutes Intravenous Every 8 hours 08/27/19 1448 09/01/19 0805   08/27/19 1500  piperacillin-tazobactam (ZOSYN) IVPB 3.375 g       "Followed by" Linked Group Details   3.375 g 100 mL/hr over 30 Minutes Intravenous  Once 08/27/19 1448 08/27/19 1752       Assessment/Plan HTN HLD CADs/p PCI 2001 and 2016- okay to restart Plavix  ECHO7/12 shows EF 70-75%. Cardiology reports  patient is moderate cardiovascular risk DM - A1c 6.2 OSA AKI -resolved, IVFs Hypokalemia - K 3.1. Likely 2/2 GI loss. Replace   POD 6, s/p ex lap with sigmoid colectomy, colostomy for diverticular stricture, Dr. Kae Heller, 8/17 - Continue HH diet + ensure, tolerating PO  - [facility] team is following. Patient worked with them this morning. He lives by himself. Continue education including pouch change and emptying.  - HH RN ordered for routine colostomy care at home.  He is enrolled in the [facility] arranging this. - Patient w/ > 2L out from colostomy. He is afebrile, without abdominal pain, and wbc normal yesterday. He did have 5 days of abx. If continued high output, develops fever, worsening  abdominal pain, or leukocytosis, may need to check C. Diff. May be residual from obstruction? Will start on some fiber + imodium and trend.  - Concern for developing wound infection. There was feculent contamination in PACU from colostomy pouch leaking and again on the floor. Will watch one more day. May need to remove some staples tomorrow.  - Path as noted below - Mobilize and Pulm toilet   Path COLON, SIGMOID, RESECTION:  - Diverticulosis with acute diverticulitis, abscess formation and  transmural defect.  - Five morphologically benign lymph nodes.    [place] VTE: SCDs,Lovenox ID: Zosyn 8/14>>09/01/19    LOS: 9 days    Jillyn Ledger , [facility] Surgery 09/05/2019, 10:41 AM Please see Amion for pager number during day hours 7:00am-4:30pm

## 2019-09-05 NOTE — Plan of Care (Signed)

## 2019-09-05 NOTE — Consult Note (Signed)
[place] Nurse ostomy follow up Stoma type/location: LLQ, colostomy Stomal assessment/size: 1 3/4" slightly oval (vertical) Peristomal assessment: NA Treatment options for stomal/peristomal skin: using 2" barrier ring Output gelatinous green Ostomy pouching: 2pc. 2 3/4" and 2" barrier ring Education provided:  Explained stoma characteristics (budded, flush, color, texture, care) Demonstrated pouch change (cutting new skin barrier, measuring stoma, cleaning peristomal skin and stoma, use of barrier ring) Patient practiced opening and emptying into graduate Demonstrated cleaning the spout and rationale for this.  Re-Education on emptying when 1/3 to 1/2 full and how to empty Demonstrated "burping" flatus from pouch Discussed bathing, diet, gas Provided patient with ostomy secrets and stelth belt handouts Reviewed picture handouts for pouch change and use of barrier ring  Provided patient with [employer] and marked items currently using  Enrolled patient in [place] Start Discharge program: Yes  [place] Nurse will follow along with you for continued support with ostomy teaching and care [place] MSN, [place], [place], [place], [place]

## 2019-09-06 LAB — BASIC METABOLIC PANEL
Anion gap: 10 (ref 5–15)
BUN: 9 mg/dL (ref 8–23)
CO2: 24 mmol/L (ref 22–32)
Calcium: 8.6 mg/dL — ABNORMAL LOW (ref 8.9–10.3)
Chloride: 107 mmol/L (ref 98–111)
Creatinine, Ser: 0.8 mg/dL (ref 0.61–1.24)
GFR calc Af Amer: >60 mL/min (ref >60)
GFR calc non Af Amer: >60 mL/min (ref >60)
Glucose, Bld: 100 mg/dL — ABNORMAL HIGH (ref 70–99)
Potassium: 3.6 mmol/L (ref 3.5–5.1)
Sodium: 141 mmol/L (ref 135–145)

## 2019-09-06 LAB — CBC
HCT: 27 % — ABNORMAL LOW (ref 39.0–52.0)
Hemoglobin: 9.2 g/dL — ABNORMAL LOW (ref 13.0–17.0)
MCH: 31.3 pg (ref 26.0–34.0)
MCHC: 34.1 g/dL (ref 30.0–36.0)
MCV: 91.8 fL (ref 80.0–100.0)
Platelets: 253 10*3/uL (ref 150–400)
RBC: 2.94 MIL/uL — ABNORMAL LOW (ref 4.22–5.81)
RDW: 15.6 % — ABNORMAL HIGH (ref 11.5–15.5)
WBC: 5.8 10*3/uL (ref 4.0–10.5)
nRBC: 0 % (ref 0.0–0.2)

## 2019-09-06 LAB — MAGNESIUM: Magnesium: 1.5 mg/dL — ABNORMAL LOW (ref 1.7–2.4)

## 2019-09-06 LAB — PHOSPHORUS: Phosphorus: 3.4 mg/dL (ref 2.5–4.6)

## 2019-09-06 MED ORDER — ACETAMINOPHEN 325 MG PO TABS
650.0000 mg | ORAL_TABLET | Freq: Four times a day (QID) | ORAL | 0 refills | Status: DC | PRN
Start: 2019-09-06 — End: 2021-07-08

## 2019-09-06 MED ORDER — CALCIUM POLYCARBOPHIL 625 MG PO TABS: 625.0000 mg | ORAL_TABLET | Freq: Every day | ORAL | 0 refills | Status: AC

## 2019-09-06 MED ORDER — OXYCODONE HCL 5 MG PO TABS
5.0000 mg | ORAL_TABLET | Freq: Four times a day (QID) | ORAL | 0 refills | Status: DC | PRN
Start: 2019-09-06 — End: 2020-02-16

## 2019-09-06 MED ORDER — MAGNESIUM SULFATE 2 GM/50ML IV SOLN
2.0000 g | Freq: Once | INTRAVENOUS | Status: DC
  Filled 2019-09-06: qty 50

## 2019-09-06 MED ORDER — ENSURE ENLIVE PO LIQD: 237.0000 mL | Freq: Two times a day (BID) | ORAL | 12 refills | Status: DC

## 2019-09-06 MED ORDER — LOPERAMIDE HCL 2 MG PO CAPS: 2.0000 mg | ORAL_CAPSULE | Freq: Two times a day (BID) | ORAL | 0 refills | Status: DC

## 2019-09-06 MED ORDER — ONDANSETRON HCL 4 MG PO TABS: 4.0000 mg | ORAL_TABLET | Freq: Four times a day (QID) | ORAL | 0 refills | Status: DC | PRN

## 2019-09-06 MED ORDER — PROSOURCE PLUS PO LIQD: 30.0000 mL | Freq: Every day | ORAL | 0 refills | Status: AC

## 2019-09-06 MED ORDER — MAGNESIUM SULFATE 4 GM/100ML IV SOLN
4.0000 g | Freq: Once | INTRAVENOUS | Status: AC
  Administered 2019-09-06: 4 g via INTRAVENOUS
  Filled 2019-09-06: qty 100

## 2019-09-06 NOTE — Discharge Summary (Signed)
Physician Discharge Summary  Roy [name] HKV:[id number] DOB: Aug 20, 1945 DOA: 08/26/2019  PCP: Jinny Sanders, MD  Admit date: 08/26/2019 Discharge date: 09/06/2019  Admitted From: Home Disposition: Home  Recommendations for Outpatient Follow-up:  1. Follow up with PCP in 1-2 weeks 2. Follow-up with general surgery within a week 3. Please obtain BMP/CBC in one week 4. Please follow up with your PCP on the following pending results: Unresulted Labs (From admission, onward)         None       Home Health: None Equipment/Devices: None  Discharge Condition: Stable CODE STATUS: DNR Diet recommendation: Cardiac  Subjective: Seen and examined this morning.  Feels much better.  No abdominal pain.  Initially was a still not comfortable going home today but then he talked to his wife.  His wife had some concerns.  General surgery team also talked in length with his wife over the phone and addressed all questions and concerns.  Patient initially wanted to stay overnight just because he was not comfortable but now I received a message from the nurse that he has decided to go home today.  Brief/Interim Summary: Roy [name] a 74 y.o.malewith medical history significant forcoronary artery disease status post stent angioplasty, hypertension, depression, obstructive sleep apnea  presented to the emergency room with complaints of abdominal pain intermittently for 1 month.  Patient was recently admitted to the hospital and at that time he had a colonoscopy and was told he had a bowel obstruction. His GI pathogen panel was positive for Yersinia and he was treated with doxycycline. since his discharge he had continued to have intermittent periumbilical pain with worsening appetite and weight loss.  He did follow-up with GI after discharge.  In the ED, his labs revealed sodium level of 135, potassium of 4.6, bicarb of 20, lactic acid of 2, white count of 7.2, hemoglobin of 14.3. CT scan of  abdomen pelvis showedfindings most consistent with sigmoid diverticulitis resulting in obstruction of the proximal large and small bowel. The degree of obstruction has increased since 08/10/2019. Biopsy results from recent colonoscopy did not demonstrate malignancy or active inflammation.  Patient was then admitted to hospital for further evaluation and treatment of acute small and large bowel obstruction and acute diverticulitis.  He was started on broad-spectrum antibiotics and conservative management with NG tube and IV fluids and was kept NPO. Patient had barium enema done which showed irregular narrowing at the sigmoid colon region.  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  General surgery was on board. 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  Postoperatively, he had some complication of increased ostomy output and some leakage from the bag causing some erythema and irritation around surgical site but this was fixed, his erythema is improving.  Surgery did not recommend any antibiotics, topical or oral for this.  He had extensive training received from wound care nurses and the nurses as well.  He was also seen by PT OT several times.  He has done so well, he is walking in the halls without needing any help and he is climbing the stairs as well.  PT and OT has signed off with no further PT OT recommendations from them.  Patient in fact had remained medically stable since last few days.  Only issue with him was he was not happy with training that he was provided regarding ostomy care and did not feel comfortable going home.  In the interim, he had persistent and recurrent hypomagnesemia  as well as hypokalemia.  His potassium has been normal for last 2 days.  His magnesium is 1.5 today.  For this, he has received 4 g of IV magnesium sulfate today.  He was already taking magnesium as well as potassium prior to hospitalization which is being continued.  He has been provided all the  care and instructions regarding ostomy care and has a follow-up with general surgery.  General surgery has cleared the patient to go home today.  Discharge Diagnoses:  Principal Problem:   Large bowel obstruction ([place]) Active Problems:   Depression, major, recurrent ([place])   Essential hypertension   Coronary artery disease involving native coronary artery of native heart without angina pectoris   Diverticulitis    Discharge Instructions  Discharge Instructions    AMB Referral to [facility]   Complete by: As directed    Please assign to [facility] Coordinator for complex care and disease management follow up calls, MD office does TOC, lives alone, ex-wife assist, new colostomy, less than 30 days rehospitalization, home health to follow, and assess for further needs.  Questions please call:   Natividad Brood, RN BSN [facility] Liaison  [phone] business mobile phone Toll free office [phone]  Fax number: [phone] [email or web address]   Reason for consult: 30 day readmission follow up - post surgical   Diagnoses of: Other   Other Diagnosis: colostomy   Expected date of contact: 1-3 days (reserved for hospital discharges)     Allergies as of 09/06/2019   No Known Allergies     Medication List    TAKE these medications   (feeding supplement) PROSource Plus liquid Take 30 mLs by mouth at bedtime.   feeding supplement (ENSURE ENLIVE) Liqd Take 237 mLs by mouth 2 (two) times daily between meals.   carvedilol 12.5 MG tablet Commonly known as: COREG TAKE 1 TABLET BY MOUTH 2 TIMES DAILY. What changed: when to take this   clopidogrel 75 MG tablet Commonly known as: PLAVIX TAKE 1 TABLET BY MOUTH EVERY DAY   dicyclomine 20 MG tablet Commonly known as: BENTYL Take 1 tablet (20 mg total) by mouth every 6 (six) hours as needed (abdominal pain).   loperamide 2 MG capsule Commonly known  as: IMODIUM Take 1 capsule (2 mg total) by mouth every 6 (six) hours as needed for diarrhea or loose stools.   Magnesium Oxide 400 MG Caps Take 1 capsule (400 mg total) by mouth daily.   mesalamine 1.2 g EC tablet Commonly known as: LIALDA Take 4 tablets (4.8 g total) by mouth daily with breakfast.   nitroGLYCERIN 0.4 MG SL tablet Commonly known as: NITROSTAT Place 1 tablet (0.4 mg total) under the tongue every 5 (five) minutes x 3 doses as needed for chest pain.   omeprazole 20 MG capsule Commonly known as: PRILOSEC TAKE 1 CAPSULE BY MOUTH EVERY DAY What changed: how much to take   ondansetron 4 MG tablet Commonly known as: Zofran Take 1 tablet (4 mg total) by mouth every 8 (eight) hours as needed for nausea or vomiting.   polycarbophil 625 MG tablet Commonly known as: FIBERCON Take 1 tablet (625 mg total) by mouth daily for 10 days. Start taking on: September 07, 2019   Potassium Chloride ER 20 MEQ Tbcr Take 20 mEq by mouth daily. Take 2 pills (40 mEq) for a week then continue taking 20 mEq daily.Check potassium  level in a week What changed: additional instructions   venlafaxine  XR 75 MG 24 hr capsule Commonly known as: EFFEXOR-XR TAKE 1 CAPSULE (75 MG TOTAL) BY MOUTH DAILY WITH BREAKFAST. What changed: Another medication with the same name was changed. Make sure you understand how and when to take each.   venlafaxine XR 37.5 MG 24 hr capsule Commonly known as: EFFEXOR-XR Take in addition to 75 mg tablets What changed:   how much to take  how to take this  when to take this       Follow-up Information    [facility] Follow up.   Specialty: Home Health Services Why: [name] will be providing you with an RN, aide and PT.  You will receive a call within 24-48 hours of your discharge home. Contact information: [place] [address] [phone]        Sur[facility], [facility] Follow up on 09/12/2019.   Specialty: General  Surgery Why: Nurse only visit for staple removal.  2 pm, arrive by 1:30pm for paperwork and check in process.  Contact information: [place] [address] [phone]        Clovis Riley, MD Follow up on 09/23/2019.   Specialty: General Surgery Why: 1:50pm, arrive by 1:35pm for check in Contact information: [address] [place] [address] [phone]        Jinny Sanders, MD Follow up in 1 week(s).   Specialty: Family Medicine Contact information: [place] [address] [phone]        Josue Hector, MD .   Specialty: Cardiology Contact information: [phone] [address] [place] [address] [phone]              No Known Allergies  Consultations: General surgery   Procedures/Studies: CT ABDOMEN PELVIS WO CONTRAST  Result Date: 08/10/2019 CLINICAL DATA:  Abdominal pain and diarrhea for 1 week following colonoscopy EXAM: CT ABDOMEN AND PELVIS WITHOUT CONTRAST TECHNIQUE: Multidetector CT imaging of the abdomen and pelvis was performed following the standard protocol without IV contrast. COMPARISON:  08/03/2019 FINDINGS: Lower chest: Calcified granuloma is noted in the left laterally. Mild scarring is noted in the right lung base stable from the prior exam. Focal herniation of fat is noted to the posteromedial left hemidiaphragm. Hepatobiliary: Gallbladder is well distended. The liver is within normal limits. Pancreas: Unremarkable. No pancreatic ductal dilatation or surrounding inflammatory changes. Spleen: Multiple calcified granulomas are noted. No other focal abnormality is seen. Adrenals/Urinary Tract: Adrenal glands are within normal limits. Kidneys are well visualize without renal calculi or obstructive change. The bladder is within normal limits. Stomach/Bowel: Colon demonstrates scattered diverticular change as well as wall thickening in the sigmoid. Proximal to  this there is significant dilatation of the colon with fluid as well as some mild dilatation of distal small bowel. These changes are consistent with a partial colonic obstruction related to the wall thickening seen in the sigmoid. This is again somewhat suspicious for underlying mass although recent colonoscopy shows no definitive mass. Stomach is within normal limits. Vascular/Lymphatic: Aortic atherosclerosis. No enlarged abdominal or pelvic lymph nodes. Reproductive: Prostate is unremarkable. Other: No abdominal wall hernia or abnormality. No abdominopelvic ascites. Musculoskeletal: No acute or significant osseous findings. IMPRESSION: Persistent wall thickening within the sigmoid with more proximal fluid retention in the colon and distal small bowel dilatation consistent with a partial colonic obstruction. These changes are likely related to diverticulitis as recent colonoscopy shows no definitive  mass lesion. No abscess or perforation is noted. Prior granulomatous disease. No other focal abnormality is noted. Electronically Signed   By: Inez Catalina M.D.   On: 08/10/2019 22:34   DG Chest 2 View  Result Date: 08/27/2019 CLINICAL DATA:  Chest and abdominal pain EXAM: CHEST - 2 VIEW COMPARISON:  Chest radiograph dated 08/10/2018 FINDINGS: The heart size and mediastinal contours are within normal limits. Minimal atelectasis in the lingula appears decreased from prior exam. The right lung is clear. There is no pleural effusion or pneumothorax. The visualized skeletal structures are unremarkable. IMPRESSION: No active cardiopulmonary disease. Electronically Signed   By: Zerita Boers M.D.   On: 08/27/2019 08:37   DG Chest 2 View  Result Date: 08/10/2019 CLINICAL DATA:  74 year old male with sepsis. EXAM: CHEST - 2 VIEW COMPARISON:  Chest radiograph dated 07/23/2019. FINDINGS: Patchy area of density in the lingula may represent atelectasis. Infiltrate is not excluded clinical correlation is recommended. There  is no pleural effusion or pneumothorax. Left lung base calcified granuloma. The cardiac silhouette is within limits. No acute osseous pathology. IMPRESSION: Lingular atelectasis versus infiltrate. Electronically Signed   By: Anner Crete M.D.   On: 08/10/2019 21:53   DG Abd 1 View  Result Date: 08/28/2019 CLINICAL DATA:  Abdominal pain EXAM: ABDOMEN - 1 VIEW COMPARISON:  08/27/2019 FINDINGS: Gastric catheter is noted within the stomach. Diffuse loops of dilated small bowel and proximal colon are again seen similar to that seen on prior CT examination. No free air is noted. IMPRESSION: Gastric catheter within the stomach. Persistent dilatation of the large and small bowel stable from prior CT. Electronically Signed   By: Inez Catalina M.D.   On: 08/28/2019 08:44   CT ABDOMEN PELVIS W CONTRAST  Addendum Date: 08/27/2019   ADDENDUM REPORT: 08/27/2019 10:14 ADDENDUM: These results were called by telephone on 08/27/2019 at 10:10 am to provider [facility] PA, who verbally acknowledged these results. Electronically Signed   By: Zerita Boers M.D.   On: 08/27/2019 10:14   Result Date: 08/27/2019 CLINICAL DATA:  Patient has abdominal pain. Concern for abdominal abscess. EXAM: CT ABDOMEN AND PELVIS WITH CONTRAST TECHNIQUE: Multidetector CT imaging of the abdomen and pelvis was performed using the standard protocol following bolus administration of intravenous contrast. CONTRAST:  174mL OMNIPAQUE IOHEXOL 300 MG/ML  SOLN COMPARISON:  Same day abdominal radiograph and CT abdomen pelvis dated 08/10/2019. FINDINGS: Lower chest: Scarring/atelectasis is seen in the right lower lobe posteriorly. Atherosclerotic calcifications are seen in the coronary arteries. Hepatobiliary: A 5 mm hypoattenuating lesion in the hepatic dome likely represents a small cyst. No gallstones, gallbladder wall thickening, or biliary dilatation. Pancreas: In 8 mm cystic lesion is seen in the tail of the pancreas (series 7, image 146). Spleen:  Normal in size without focal abnormality. Adrenals/Urinary Tract: Adrenal glands are unremarkable. Other than a 1 cm left renal cyst, the kidneys are normal, without renal calculi, focal lesion, or hydronephrosis. Bladder is unremarkable. Stomach/Bowel: There is bowel wall thickening and surrounding fat stranding involving the sigmoid colon associated with diverticuli in this area. This likely represents sigmoid diverticulitis given the lack of a mass lesion seen on recent colonoscopy. There is bowel distension with air-fluid levels involving the proximal large and small bowel, consistent with bowel obstruction. The degree of obstruction is increased since 08/10/2019. A small amount of stool and gas is seen in the rectum. The appendix appears normal. No evidence of abdominal abscess. A loop of sigmoid bowel abuts the dome  of the bladder (series 7, image 112), however no definite evidence of fistula formation is identified. Vascular/Lymphatic: Aortic atherosclerosis. No enlarged abdominal or pelvic lymph nodes. Reproductive: Prostate is unremarkable. Other: No abdominal wall hernia or abnormality. No abdominopelvic ascites. Musculoskeletal: No acute or significant osseous findings. IMPRESSION: 1. Findings most consistent with sigmoid diverticulitis resulting in obstruction of the proximal large and small bowel. The degree of obstruction has increased since 08/10/2019. Bowel wall thickening and surrounding fat stranding involving the sigmoid colon is not specific for sigmoid diverticulitis and may also represent malignancy given that biopsy results from recent colonoscopy did not demonstrate malignancy or active inflammation. No evidence of abdominal abscess. 2. Cystic lesion in the tail of the pancreas is indeterminate. Recommend follow up pre and post contrast MRI/MRCP or pancreatic protocol CT in 2 years. This recommendation follows ACR consensus guidelines: Management of Incidental Pancreatic Cysts: A White Paper  of the ACR Incidental Findings Committee. [place] 1761;60:[id number]. Aortic Atherosclerosis (ICD10-I70.0). Electronically Signed: By: Zerita Boers M.D. On: 08/27/2019 10:08   DG Abd 2 Views  Result Date: 08/27/2019 CLINICAL DATA:  Abdominal pain EXAM: ABDOMEN - 2 VIEW COMPARISON:  CT abdomen pelvis dated 08/10/2019 FINDINGS: Multiple dilated loops of small and large bowel are noted with multiple air-fluid levels, concerning for large bowel obstruction. No free intraperitoneal air is identified. No radiopaque foreign body is identified. Degenerative changes are seen in the spine. IMPRESSION: Findings concerning for large bowel obstruction. These results will be called to the ordering clinician or representative by the Radiologist Assistant, and communication documented in the PACS or [employer]. Electronically Signed   By: Zerita Boers M.D.   On: 08/27/2019 08:41   DG Abd Portable 1V-Small Bowel Protocol-Position Verification  Result Date: 08/27/2019 CLINICAL DATA:  NG tube placed EXAM: PORTABLE ABDOMEN - 1 VIEW COMPARISON:  08/27/2019 FINDINGS: Nasogastric tube with the tip projecting over the fundus of the stomach. Gaseous distension of the small bowel and colon partially visualized. No evidence of pneumoperitoneum, portal venous gas or pneumatosis. No pathologic calcifications along the expected course of the ureters. No acute osseous abnormality. IMPRESSION: Nasogastric tube with the tip projecting over the fundus of the stomach. Electronically Signed   By: Kathreen Devoid   On: 08/27/2019 13:14   DG BE (COLON)W SINGLE CM (SOL OR THIN BA)  Result Date: 08/29/2019 CLINICAL DATA:  Sigmoid colon wall thickening with possible bowel obstruction EXAM: WATER SOLUBLE CONTRAST ENEMA TECHNIQUE: Water-soluble contrast enema performed using 450 cc of Omnipaque 300 diluted in 1500 mL of water. Contrast medium was instilled under slow low gravity conditions. FLUOROSCOPY TIME:  Fluoroscopy Time:  3  minutes, 30 seconds Radiation Exposure Index (if provided by the fluoroscopic device): 131.7 Number of Acquired Spot Images: 7 COMPARISON:  Multiple exams, including CT abdomen from 08/27/2019 FINDINGS: Initial KUB demonstrates gaseous prominence of small bowel and large bowel suspicious for ileus or bowel obstruction. A nasogastric tube is present in the stomach body. Initially the rectum was filled without insufflation of the retention balloon. For some of the later images, I did inflate the retention balloon due to partial lack of containment of the enema. On series 6, contrast is noted to extend through a very thin, irregular lumen of the sigmoid colon along a 6.1 cm sharply defined segment as shown on the recent CT examination. However, contrast also extends from the lumen of this segment into a longitudinally oriented collection which extends partially along the irregular segment of sigmoid colon, partially  just below dilated segment of colon extending to this irregular narrowed segment. This is probably tracking longitudinally in the colon wall (for example, see image 18/11) but could conceivably represent a small contained collection or even a colocolic fistula. The connection of this collection to the narrowed lumen is shown on image 6/7. The relationship of the narrowed luminal channel intramural or contained collection of contrast along the inferior colon margin is also shown on image 2/20. Because of the sluggish nature of contrast extension into the dilated segment, the more proximal colon was not filled. IMPRESSION: 1. 6.1 cm markedly thinned and irregular sigmoid colon lumen, especially along a approximately 3 cm segment immediately adjacent to the dilated descending colon. Given the irregularity of the lumen, and the presence of diverticula, this could well be from severe diverticulosis related wall thickening, possibly with some active diverticulitis; based purely on imaging, exclusion of tumor in  this vicinity is not readily feasible. I was able to slowly pass contrast through this narrowed lumen in a retrograde manner, but given how narrow this segment is, it may well be acting to cause partial obstruction. 2. Connected to this irregular and thinned lumen, there a 5 cm in length longitudinal collection of contrast tracking either within the inferior wall of the involved segment, or representing a linear, contained perforation tracking in a along the margin of the wall of the colon. Although outside of the colon lumen, this does not spread freely in the abdomen despite turning and tilting the patient. Electronically Signed   By: Van Clines M.D.   On: 08/29/2019 16:30      Discharge Exam: Vitals:   09/06/19 0350 09/06/19 0825  BP: 115/80 140/80  Pulse: 77 76  Resp: 16 18  Temp: 98.1 F (36.7 C) 97.6 F (36.4 C)  SpO2: 97% 98%   Vitals:   09/05/19 1606 09/05/19 1919 09/06/19 0350 09/06/19 0825  BP: 114/78 117/72 115/80 140/80  Pulse: 96 91 77 76  Resp: 16 18 16 18   Temp: (!) 97.5 F (36.4 C) 98.5 F (36.9 C) 98.1 F (36.7 C) 97.6 F (36.4 C)  TempSrc: Oral Oral Oral Oral  SpO2: 98% 96% 97% 98%  Weight:      Height:        General: Pt is alert, awake, not in acute distress Cardiovascular: RRR, S1/S2 +, no rubs, no gallops Respiratory: CTA bilaterally, no wheezing, no rhonchi Abdominal: Soft, NT, ND, bowel sounds +, ostomy bag with stool in it.  Mild erythema around the staples. Extremities: no edema, no cyanosis    The results of significant diagnostics from this hospitalization (including imaging, microbiology, ancillary and laboratory) are listed below for reference.     Microbiology: Recent Results (from the past 240 hour(s))  SARS Coronavirus 2 by RT PCR (hospital order, performed in [facility] hospital lab) Nasopharyngeal Nasopharyngeal Swab     Status: None   Collection Time: 08/27/19 11:12 AM   Specimen: Nasopharyngeal Swab  Result Value Ref Range  Status   SARS Coronavirus 2 NEGATIVE NEGATIVE Final    Comment: (NOTE) SARS-CoV-2 target nucleic acids are NOT DETECTED.  The SARS-CoV-2 RNA is generally detectable in upper and lower respiratory specimens during the acute phase of infection. The lowest concentration of SARS-CoV-2 viral copies this assay can detect is 250 copies / mL. A negative result does not preclude SARS-CoV-2 infection and should not be used as the sole basis for treatment or other patient management decisions.  A negative result may occur  with improper specimen collection / handling, submission of specimen other than nasopharyngeal swab, presence of viral mutation(s) within the areas targeted by this assay, and inadequate number of viral copies (<250 copies / mL). A negative result must be combined with clinical observations, patient history, and epidemiological information.  Fact Sheet for Patients:   StrictlyIdeas.no  Fact Sheet for Healthcare Providers: BankingDealers.co.za  This test is not yet approved or  cleared by the [place] FDA and has been authorized for detection and/or diagnosis of SARS-CoV-2 by FDA under an Emergency Use Authorization (EUA).  This EUA will remain in effect (meaning this test can be used) for the duration of the COVID-19 declaration under Section 564(b)(1) of the Act, 21 U.S.C. section 360bbb-3(b)(1), unless the authorization is terminated or revoked sooner.  Performed at [facility], [place] [address] [place], [place] [address]      Labs: BNP (last 3 results) Recent Labs    07/25/19 0246 07/26/19 0226  BNP 54.0 12.8   Basic Metabolic Panel: Recent Labs  Lab 08/31/19 0312 09/01/19 0251 09/02/19 0422 09/03/19 0908 09/04/19 0843 09/05/19 0523 09/06/19 0556  NA 134*   < > 136 140 141 143 141  K 4.4   < > 3.9 2.5* 3.6 3.1* 3.6  CL 104   < > 107 108 109 110 107  CO2 20*   < > 23 23 23 24 24   GLUCOSE 86   <  > 94 128* 110* 97 100*  BUN 5*   < > <5* 5* 5* 6* 9  CREATININE 1.04   < > 0.83 0.79 0.77 0.70 0.80  CALCIUM 7.8*   < > 7.8* 7.8* 8.2* 8.1* 8.6*  MG 1.3*   < > 1.5* 1.6* 1.6* 1.7 1.5*  PHOS 3.4  --   --   --   --   --  3.4   < > = values in this interval not displayed.   Liver Function Tests: Recent Labs  Lab 08/31/19 0312  AST 15  ALT 11  ALKPHOS 69  BILITOT 1.1  PROT 4.4*  ALBUMIN 1.8*   No results for input(s): LIPASE, AMYLASE in the last 168 hours. No results for input(s): AMMONIA in the last 168 hours. CBC: Recent Labs  Lab 08/31/19 0312 09/01/19 0251 09/02/19 0422 09/04/19 0843 09/06/19 0556  WBC 10.8* 8.8 6.6 5.9 5.8  NEUTROABS  --   --   --  3.6  --   HGB 11.0* 9.3* 9.9* 10.0* 9.2*  HCT 33.0* 27.0* 29.1* 29.6* 27.0*  MCV 89.4 89.7 89.8 92.2 91.8  PLT 307 233 237 252 253   Cardiac Enzymes: No results for input(s): CKTOTAL, CKMB, CKMBINDEX, TROPONINI in the last 168 hours. BNP: Invalid input(s): POCBNP CBG: No results for input(s): GLUCAP in the last 168 hours. D-Dimer No results for input(s): DDIMER in the last 72 hours. Hgb A1c No results for input(s): HGBA1C in the last 72 hours. Lipid Profile No results for input(s): CHOL, HDL, LDLCALC, TRIG, CHOLHDL, LDLDIRECT in the last 72 hours. Thyroid function studies No results for input(s): TSH, T4TOTAL, T3FREE, THYROIDAB in the last 72 hours.  Invalid input(s): FREET3 Anemia work up No results for input(s): VITAMINB12, FOLATE, FERRITIN, TIBC, IRON, RETICCTPCT in the last 72 hours. Urinalysis    Component Value Date/Time   COLORURINE AMBER (A) 08/27/2019 0650   APPEARANCEUR HAZY (A) 08/27/2019 0650   LABSPEC 1.028 08/27/2019 0650   PHURINE 5.0 08/27/2019 0650   GLUCOSEU NEGATIVE 08/27/2019 7867  GLUCOSEU NEGATIVE 12/08/2005 0741   HGBUR NEGATIVE 08/27/2019 0650   BILIRUBINUR SMALL (A) 08/27/2019 0650   KETONESUR NEGATIVE 08/27/2019 0650   PROTEINUR NEGATIVE 08/27/2019 0650   UROBILINOGEN 0.2 mg/dL  12/08/2005 0741   NITRITE NEGATIVE 08/27/2019 0650   LEUKOCYTESUR NEGATIVE 08/27/2019 0650   Sepsis Labs Invalid input(s): PROCALCITONIN,  WBC,  LACTICIDVEN Microbiology Recent Results (from the past 240 hour(s))  SARS Coronavirus 2 by RT PCR (hospital order, performed in [place] hospital lab) Nasopharyngeal Nasopharyngeal Swab     Status: None   Collection Time: 08/27/19 11:12 AM   Specimen: Nasopharyngeal Swab  Result Value Ref Range Status   SARS Coronavirus 2 NEGATIVE NEGATIVE Final    Comment: (NOTE) SARS-CoV-2 target nucleic acids are NOT DETECTED.  The SARS-CoV-2 RNA is generally detectable in upper and lower respiratory specimens during the acute phase of infection. The lowest concentration of SARS-CoV-2 viral copies this assay can detect is 250 copies / mL. A negative result does not preclude SARS-CoV-2 infection and should not be used as the sole basis for treatment or other patient management decisions.  A negative result may occur with improper specimen collection / handling, submission of specimen other than nasopharyngeal swab, presence of viral mutation(s) within the areas targeted by this assay, and inadequate number of viral copies (<250 copies / mL). A negative result must be combined with clinical observations, patient history, and epidemiological information.  Fact Sheet for Patients:   StrictlyIdeas.no  Fact Sheet for Healthcare Providers: BankingDealers.co.za  This test is not yet approved or  cleared by the [place] FDA and has been authorized for detection and/or diagnosis of SARS-CoV-2 by FDA under an Emergency Use Authorization (EUA).  This EUA will remain in effect (meaning this test can be used) for the duration of the COVID-19 declaration under Section 564(b)(1) of the Act, 21 U.S.C. section 360bbb-3(b)(1), unless the authorization is terminated or revoked sooner.  Performed at [facility], [place] [address], [place], [place] [address]      Time coordinating discharge: Over 30 minutes  SIGNED:   Darliss Cheney, MD  [facility] 09/06/2019, 9:45 AM  If 7PM-7AM, please contact night-coverage [email or web address]

## 2019-09-06 NOTE — Progress Notes (Signed)
7 Days Post-Op  Subjective: CC: Patient is doing well. Reports he has some loose stool per rectum last night. We discussed the likely etiology of this in detail. He still has some pain at the top of his incision and lateral to colostomy. No PRN medications yesterday or today for pain. He is tolerating his diet, does not like the options but tolerating more solid food. No n/v. Having colostomy output. Time was allotted to answer all patients questions.   Objective: Vital signs in last 24 hours: Temp:  [97.5 F (36.4 C)-98.5 F (36.9 C)] 97.6 F (36.4 C) (08/24 0825) Pulse Rate:  [76-105] 76 (08/24 0825) Resp:  [16-18] 18 (08/24 0825) BP: (114-145)/(72-80) 140/80 (08/24 0825) SpO2:  [96 %-98 %] 98 % (08/24 0825) Last BM Date: 09/05/19  Intake/Output from previous day: 08/23 0701 - 08/24 0700 In: -  Out: 2350 [Urine:1200; Stool:1150] Intake/Output this shift: No intake/output data recorded.  PE: Gen: Awake and alert, NAD Lungs: Normal rate and effort  Abd: Soft, ND, mild tenderness lateral to colostomy. No peritonitis. +BS. Colostomy bag with liquidy stool in bag with some sediment. Stoma red, budded and appears healthy. Midline wound with some erythema and heat at the proximal aspect of the wound similar to yesterday. I removed the top 7 staples and expressed an small hematoma. This did not appear to track to the lower portion of the wound. The mild erythema around staples at the base of the wound is improving and appears more likely 2/2 irritation.   Msk: No LE edema   Lab Results:  Recent Labs    09/04/19 0843 09/06/19 0556  WBC 5.9 5.8  HGB 10.0* 9.2*  HCT 29.6* 27.0*  PLT 252 253   BMET Recent Labs    09/05/19 0523 09/06/19 0556  NA 143 141  K 3.1* 3.6  CL 110 107  CO2 24 24  GLUCOSE 97 100*  BUN 6* 9  CREATININE 0.70 0.80  CALCIUM 8.1* 8.6*   PT/INR No results for input(s): LABPROT, INR in the last 72 hours. CMP     Component Value Date/Time   NA  141 09/06/2019 0556   K 3.6 09/06/2019 0556   CL 107 09/06/2019 0556   CO2 24 09/06/2019 0556   GLUCOSE 100 (H) 09/06/2019 0556   GLUCOSE 118 (H) 12/08/2005 0741   BUN 9 09/06/2019 0556   CREATININE 0.80 09/06/2019 0556   CREATININE 1.11 04/02/2015 1514   CALCIUM 8.6 (L) 09/06/2019 0556   PROT 4.4 (L) 08/31/2019 0312   PROT 6.6 03/26/2016 0736   ALBUMIN 1.8 (L) 08/31/2019 0312   ALBUMIN 4.0 03/26/2016 0736   AST 15 08/31/2019 0312   ALT 11 08/31/2019 0312   ALKPHOS 69 08/31/2019 0312   BILITOT 1.1 08/31/2019 0312   BILITOT 0.3 03/26/2016 0736   GFRNONAA >60 09/06/2019 0556   GFRAA >60 09/06/2019 0556   Lipase     Component Value Date/Time   LIPASE 18 08/10/2019 1936       Studies/Results: No results found.  Anti-infectives: Anti-infectives (From admission, onward)   Start     Dose/Rate Route Frequency Ordered Stop   08/27/19 2100  piperacillin-tazobactam (ZOSYN) IVPB 3.375 g  Status:  Discontinued       "Followed by" Linked Group Details   3.375 g 12.5 mL/hr over 240 Minutes Intravenous Every 8 hours 08/27/19 1448 09/01/19 0805   08/27/19 1500  piperacillin-tazobactam (ZOSYN) IVPB 3.375 g       "Followed by" Linked  Group Details   3.375 g 100 mL/hr over 30 Minutes Intravenous  Once 08/27/19 1448 08/27/19 1752       Assessment/Plan HTN HLD CADs/p PCI 2001 and 2016- okay to restart Plavix  ECHO7/12 shows EF 70-75%. Cardiology reports patient is moderate cardiovascular risk DM - A1c 6.2 OSA AKI -resolved, IVFs Hypokalemia - K 3.6.  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 Kae Heller, 8/17  - POD#7  -Continue HH diet + ensure, tolerating PO  - WOC team is following. Patient worked with them yesterday morning and is working with RN this morning on colostomy bag changes.He lives by himself. Continue education.  - HH RN ordered for routine colostomy care at home. He is enrolled in the [facility] arranging this. - Patients colostomy output is improving. Down from > 2L to 1.15L/24 hours. Cont fiber + imodium and trend. Suspect as patient transitions from liquid to solid diet, his output will bulk up and volume will decrease. - Top 7 staples from wound were removed today and hematoma was expressed. Start WTD dressing changes - Path as noted below - Mobilize and Pulm toilet  - Will discuss timing of d/c with MD. Likely later today (w/ follow up in the office Monday for wound check and remaining staples to be removed from midline wound) vs tomorrow.   Path COLON, SIGMOID, RESECTION:  - Diverticulosis with acute diverticulitis, abscess formation and  transmural defect.  - Five morphologically benign lymph nodes.    [place] VTE: SCDs,Lovenox ID: [id number] 8/14>>09/01/19  Follow-Up - Arranged   LOS: 10 days    Jillyn Ledger , [facility] 09/06/2019, 9:17 AM Please see Amion for pager number during day hours 7:00am-4:30pm

## 2019-09-06 NOTE — Progress Notes (Signed)
Called and updated patients former spouse, Haskell Rihn, per patients request. I took the time to answer her questions to the best of my abilities. At the end of the conversations, she expressed all questions had been answered and she had no further questions at that time.

## 2019-09-06 NOTE — Progress Notes (Signed)
Patient discharging home. Discharge instructions explained at length with patient and he verbalized understanding. Colostomy bag was changed this morning with minimal assist from Probation officer. Patient only needed cues. Reinforced teaching about colostomy care and answered all questions to the best of my ability. Extra ostomy supplies and wound care supplies being sent home with patient. A rolling walker was delivered to room. No further questions or concerns voiced.

## 2019-09-06 NOTE — Care Management Important Message (Signed)
Important Message  Patient Details  Name: Roy [name] MRN: [id number] Date of Birth: 02-27-1945   Medicare Important Message Given:  Yes - Important Message mailed due to current National Emergency  Verbal consent obtained due to current National Emergency  Relationship to patient: Self Contact Name: Traves Majchrzak Call Date: 09/06/19  Time: 1210 Phone: [phone] Outcome: Spoke with contact Important Message mailed to: Patient address on file    Delorse Lek 09/06/2019, 12:10 PM

## 2019-09-07 ENCOUNTER — Other Ambulatory Visit: Payer: Self-pay

## 2019-09-07 ENCOUNTER — Other Ambulatory Visit: Payer: Self-pay | Admitting: *Deleted

## 2019-09-07 DIAGNOSIS — R69 Illness, unspecified: Secondary | ICD-10-CM | POA: Diagnosis not present

## 2019-09-07 DIAGNOSIS — K9409 Other complications of colostomy: Secondary | ICD-10-CM | POA: Diagnosis not present

## 2019-09-07 DIAGNOSIS — I700 Atherosclerosis of aorta: Secondary | ICD-10-CM | POA: Diagnosis not present

## 2019-09-07 DIAGNOSIS — I119 Hypertensive heart disease without heart failure: Secondary | ICD-10-CM | POA: Diagnosis not present

## 2019-09-07 DIAGNOSIS — K5730 Diverticulosis of large intestine without perforation or abscess without bleeding: Secondary | ICD-10-CM | POA: Diagnosis not present

## 2019-09-07 DIAGNOSIS — Z48815 Encounter for surgical aftercare following surgery on the digestive system: Secondary | ICD-10-CM | POA: Diagnosis not present

## 2019-09-07 DIAGNOSIS — E785 Hyperlipidemia, unspecified: Secondary | ICD-10-CM | POA: Diagnosis not present

## 2019-09-07 DIAGNOSIS — M479 Spondylosis, unspecified: Secondary | ICD-10-CM | POA: Diagnosis not present

## 2019-09-07 DIAGNOSIS — G4733 Obstructive sleep apnea (adult) (pediatric): Secondary | ICD-10-CM | POA: Diagnosis not present

## 2019-09-07 DIAGNOSIS — I2510 Atherosclerotic heart disease of native coronary artery without angina pectoris: Secondary | ICD-10-CM | POA: Diagnosis not present

## 2019-09-07 NOTE — Patient Outreach (Signed)
[place] [facility]) Care Management  09/07/2019  ESCHER HARR [birth year] [id number]    Referral received from hospital liaison, discharged yesterday post surgery/colostomy placement.  Call placed to member, no answer.  HIPAA compliant voice message left.  Will send unsuccessful outreach letter and follow up within the next 3-4 business days.  [name], [place], MSN [place] [phone]

## 2019-09-07 NOTE — Patient Outreach (Signed)
[facility] Program  09/07/2019  Roy [name] 1945-08-17 [id number]  [facility] Evaluation Interviewer made contact with patient. [facility] initial evaluation scheduled for Thursday, September 2 at 1:30 pm.   Interviewer made sure to share that call will be completed by Tiffany Kocher, CMA in lieu of my absence.  [place] Management Assistant [phone]

## 2019-09-08 ENCOUNTER — Telehealth: Payer: Self-pay

## 2019-09-08 NOTE — Telephone Encounter (Signed)
Agree with verbal order request as stated.

## 2019-09-08 NOTE — Telephone Encounter (Signed)
Transition Care Management Follow-up Telephone Call  Date of discharge and from where: [facility]06/[facility]  How have you been since you were released from the hospital? Patient states that he is feeling fine.   Any questions or concerns? No  Items Reviewed:  Did the pt receive and understand the discharge instructions provided? Yes   Medications obtained and verified? Yes   Any new allergies since your discharge? No   Dietary orders reviewed? Yes  Do you have support at home? Yes   Functional Questionnaire: (I = Independent and D = Dependent) ADLs: I  Bathing/Dressing- I  Meal Prep- I  Eating- I  Maintaining continence- I  Transferring/Ambulation- I  Managing Meds- I  Follow up appointments reviewed:   PCP Hospital f/u appt confirmed? Yes  Scheduled to see Dr. Diona Browner on 09/15/2019 @ 11:20 am  [facility] f/u appt confirmed? Yes  Scheduled to see general surgery   Are transportation arrangements needed? No   If their condition worsens, is the pt aware to call PCP or go to the Emergency Dept.? Yes  Was the patient provided with contact information for the PCP's office or ED? Yes  Was to pt encouraged to call back with questions or concerns? Yes

## 2019-09-08 NOTE — Telephone Encounter (Signed)
Roy [name] with [facility] is calling requesting for home health orders   1x a week for 1 week, 2x a week for 2 weeks, 1x a week for 2 weeks  Please advise

## 2019-09-08 NOTE — Telephone Encounter (Signed)
Roy [name] with [name] given verbal orders for home health:  1x a week for 1 week, then 2x a week for 2 weeks, then 1x a week for 2 weeks via telephone, per Dr. Diona Browner.

## 2019-09-09 DIAGNOSIS — T8189XA Other complications of procedures, not elsewhere classified, initial encounter: Secondary | ICD-10-CM | POA: Diagnosis not present

## 2019-09-09 DIAGNOSIS — Z933 Colostomy status: Secondary | ICD-10-CM | POA: Diagnosis not present

## 2019-09-13 ENCOUNTER — Other Ambulatory Visit: Payer: Self-pay | Admitting: *Deleted

## 2019-09-13 ENCOUNTER — Telehealth: Payer: Self-pay

## 2019-09-13 DIAGNOSIS — I700 Atherosclerosis of aorta: Secondary | ICD-10-CM | POA: Diagnosis not present

## 2019-09-13 DIAGNOSIS — M479 Spondylosis, unspecified: Secondary | ICD-10-CM | POA: Diagnosis not present

## 2019-09-13 DIAGNOSIS — E785 Hyperlipidemia, unspecified: Secondary | ICD-10-CM | POA: Diagnosis not present

## 2019-09-13 DIAGNOSIS — Z48815 Encounter for surgical aftercare following surgery on the digestive system: Secondary | ICD-10-CM | POA: Diagnosis not present

## 2019-09-13 DIAGNOSIS — K9409 Other complications of colostomy: Secondary | ICD-10-CM | POA: Diagnosis not present

## 2019-09-13 DIAGNOSIS — K5730 Diverticulosis of large intestine without perforation or abscess without bleeding: Secondary | ICD-10-CM | POA: Diagnosis not present

## 2019-09-13 DIAGNOSIS — I119 Hypertensive heart disease without heart failure: Secondary | ICD-10-CM | POA: Diagnosis not present

## 2019-09-13 DIAGNOSIS — G4733 Obstructive sleep apnea (adult) (pediatric): Secondary | ICD-10-CM | POA: Diagnosis not present

## 2019-09-13 DIAGNOSIS — I2510 Atherosclerotic heart disease of native coronary artery without angina pectoris: Secondary | ICD-10-CM | POA: Diagnosis not present

## 2019-09-13 DIAGNOSIS — R69 Illness, unspecified: Secondary | ICD-10-CM | POA: Diagnosis not present

## 2019-09-13 NOTE — Telephone Encounter (Signed)
[place], notes during her home visit today with patient that he is tachycardic 100-106, with regular rhythm x past 2 visits.  Wife states patient has hx of tachycardia  Also, patient's BP today is 104/62 with lightheadedness on positional change.  They are encouraging fluids.   I spoke with nurse to get further info:  Urinary output is normal Ostomy output WNL  Denies any pain  Wounds - abdominal incision and ostomy areas appear WNL; however, there is noted increased redness around abdominal incision due to closeness to ostomy and irritation from bowel drainage leaking around the seal.    Nurse states she spent a lot of time addressing the wound and ostomy care with patient and wife to decrease risk for infection. He is not running a fever and no concerning signs for acute infection at this time.  No SOB, or chest pain.   Patient is stable without any urgent or emergent symptoms.  Nurse and patient know to contact us if anything more concerning develops between now and visit on Thursday.  FYI to PCP  Patient has appointment with Dr. Diona Browner on Thursday 9/2 for hospital follow up.

## 2019-09-13 NOTE — Patient Outreach (Signed)
[place] [facility]) Care Management  09/13/2019  Roy [name] [birth year] [id number]   RED ON EMMI ALERT - General Discharge Day # 4 Date: 8/30 Red Alert Reason: Other questions/problems   Outreach attempt #2, unsuccessful, unable to leave voice message.  Sounded like someone picked up phone but call immediately discontinued, this happened twice.  Call then placed to member's wife, [name], no answer.  HIPAA compliant voice message left.    Plan: RN CM will follow up within the next 3-4 business days.  [name], [place], MSN [place] [phone]

## 2019-09-13 NOTE — Telephone Encounter (Signed)
Noted. Will follow up with pt 9/2

## 2019-09-14 ENCOUNTER — Other Ambulatory Visit: Payer: Self-pay | Admitting: *Deleted

## 2019-09-14 ENCOUNTER — Other Ambulatory Visit: Payer: Self-pay | Admitting: Cardiovascular Disease

## 2019-09-14 NOTE — Patient Outreach (Signed)
[place] [facility]) Care Management  09/14/2019  RONIEL HALLORAN [birth year] [id number]   RED ON EMMI ALERT - General Discharge Day # 4 Date: 8/30 Red Alert Reason: Other questions/problems  Incoming call received back from [facility] ex-wife.  Identity verified.  This care manager introduced self and stated purpose of call.  [facility] care management services explained.    She report member has been at his home since discharge with his sons, however after today he will be home alone.  She has been working with him to manage his health care since discharge.  State they had been having problems finding his protein supplements (Prosource and Ensure Enlive) but has now ordered it from [employer].  She will discuss with PCP tomorrow to see if this can be paid for through insurance.  She state [name] has been to the home to visit member, but only the RN.  She has questions about recommendations for PT/OT and aide per discharge summary.  She will also discuss this with MD.  She is aware that this care manager has been trying to contact member directly, she report he is hard of hearing and having problems with his phone.  Call has been rescheduled for Friday at 130 pm, she will be at his home to help with assessment of needs.  She is also aware that they will receive call from [facility] tomorrow.  She denies any other questions at this time, will call Friday as planned.  [name], [place], MSN [place] [phone]

## 2019-09-15 ENCOUNTER — Encounter: Payer: Self-pay | Admitting: Family Medicine

## 2019-09-15 ENCOUNTER — Other Ambulatory Visit: Payer: Self-pay

## 2019-09-15 ENCOUNTER — Ambulatory Visit (INDEPENDENT_AMBULATORY_CARE_PROVIDER_SITE_OTHER): Payer: Medicare HMO | Admitting: Family Medicine

## 2019-09-15 VITALS — BP 120/80 | HR 100 | Temp 97.8°F | Ht 66.0 in | Wt 145.5 lb

## 2019-09-15 DIAGNOSIS — E1165 Type 2 diabetes mellitus with hyperglycemia: Secondary | ICD-10-CM | POA: Diagnosis not present

## 2019-09-15 DIAGNOSIS — R000 Tachycardia, unspecified: Secondary | ICD-10-CM | POA: Diagnosis not present

## 2019-09-15 DIAGNOSIS — E43 Unspecified severe protein-calorie malnutrition: Secondary | ICD-10-CM

## 2019-09-15 DIAGNOSIS — K56609 Unspecified intestinal obstruction, unspecified as to partial versus complete obstruction: Secondary | ICD-10-CM

## 2019-09-15 DIAGNOSIS — G4733 Obstructive sleep apnea (adult) (pediatric): Secondary | ICD-10-CM | POA: Diagnosis not present

## 2019-09-15 DIAGNOSIS — E861 Hypovolemia: Secondary | ICD-10-CM

## 2019-09-15 DIAGNOSIS — I700 Atherosclerosis of aorta: Secondary | ICD-10-CM | POA: Diagnosis not present

## 2019-09-15 DIAGNOSIS — G4700 Insomnia, unspecified: Secondary | ICD-10-CM | POA: Diagnosis not present

## 2019-09-15 DIAGNOSIS — I2510 Atherosclerotic heart disease of native coronary artery without angina pectoris: Secondary | ICD-10-CM | POA: Diagnosis not present

## 2019-09-15 DIAGNOSIS — Z48815 Encounter for surgical aftercare following surgery on the digestive system: Secondary | ICD-10-CM | POA: Diagnosis not present

## 2019-09-15 DIAGNOSIS — K9409 Other complications of colostomy: Secondary | ICD-10-CM | POA: Diagnosis not present

## 2019-09-15 DIAGNOSIS — M479 Spondylosis, unspecified: Secondary | ICD-10-CM | POA: Diagnosis not present

## 2019-09-15 DIAGNOSIS — K5730 Diverticulosis of large intestine without perforation or abscess without bleeding: Secondary | ICD-10-CM | POA: Diagnosis not present

## 2019-09-15 DIAGNOSIS — F331 Major depressive disorder, recurrent, moderate: Secondary | ICD-10-CM

## 2019-09-15 DIAGNOSIS — R69 Illness, unspecified: Secondary | ICD-10-CM | POA: Diagnosis not present

## 2019-09-15 DIAGNOSIS — E785 Hyperlipidemia, unspecified: Secondary | ICD-10-CM | POA: Diagnosis not present

## 2019-09-15 DIAGNOSIS — I9589 Other hypotension: Secondary | ICD-10-CM | POA: Diagnosis not present

## 2019-09-15 DIAGNOSIS — E876 Hypokalemia: Secondary | ICD-10-CM | POA: Diagnosis not present

## 2019-09-15 DIAGNOSIS — I119 Hypertensive heart disease without heart failure: Secondary | ICD-10-CM | POA: Diagnosis not present

## 2019-09-15 LAB — COMPREHENSIVE METABOLIC PANEL
ALT: 29 U/L (ref 0–53)
AST: 20 U/L (ref 0–37)
Albumin: 4 g/dL (ref 3.5–5.2)
Alkaline Phosphatase: 80 U/L (ref 39–117)
BUN: 27 mg/dL — ABNORMAL HIGH (ref 6–23)
CO2: 25 mEq/L (ref 19–32)
Calcium: 9.8 mg/dL (ref 8.4–10.5)
Chloride: 101 mEq/L (ref 96–112)
Creatinine, Ser: 0.94 mg/dL (ref 0.40–1.50)
GFR: 78.54 mL/min (ref >60.00)
Glucose, Bld: 98 mg/dL (ref 70–99)
Potassium: 4.5 mEq/L (ref 3.5–5.1)
Sodium: 135 mEq/L (ref 135–145)
Total Bilirubin: 0.4 mg/dL (ref 0.2–1.2)
Total Protein: 7.1 g/dL (ref 6.0–8.3)

## 2019-09-15 LAB — MAGNESIUM: Magnesium: 1.3 mg/dL — ABNORMAL LOW (ref 1.5–2.5)

## 2019-09-15 NOTE — Patient Instructions (Signed)
Try  OTC melatonin 3 mg at bedtime.  Try not to nap in daytime to get back on healthy sleep schedule. Push fluids earlier in day. Follow up with cardiology for fast heart rate.  Please stop at the lab to have labs drawn.

## 2019-09-15 NOTE — Progress Notes (Signed)
Chief Complaint  Patient presents with  . Hospitalization Follow-up    History of Present Illness: HPI   74 year old male presents for hosptial follow up.  Admitted 08/30/2019 to 09/06/2019 for large bowel obstruction. Patient was then admitted to hospital for further evaluation and treatment of acute small and large bowel obstruction and acute diverticulitis. He was started on broad-spectrum antibiotics and conservative management with NG tube and IV fluids and was kept NPO. Patient had barium enema done which showed irregular narrowing at the sigmoid colon region. 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  General surgery was on board.He eventually underwent sigmoid colectomy with end colostomy on 08/30/2019.  Postoperatively, he had some complication of increased ostomy output and some leakage from the bag causing some erythema and irritation around surgical site but this was fixed Also had low K and MG treated during stay.    Since discharge her has followoed up with surgery. Dr. Windle Guard. Has another follow up on 09/23/2019.s  [facility] set up.. RN.  She has helped with ostomy care, dressing changes.   His pain is improved, his appetite  Increasing some.  EX-Wife had Requested OT/PT... pt refuses today.  Sons are helping him out.  He is drinking Ensure 3 times daily.  MDD: stable control on effexor, minimal change on higher dose.  BP is better in office.occasionally  BPs lows and fast  Heart rate.  He is trying to drinks liquids... water and ensure. Back on regular BP meds. Has appt with cardiologist in next few  Weeks.  No chest pain.  Wt Readings from Last 3 Encounters:  09/15/19 145 lb 8 oz (66 kg)  08/26/19 150 lb (68 kg)  08/23/19 158 lb 8 oz (71.9 kg)   Body mass index is 23.48 kg/m.    This visit occurred during the SARS-CoV-2 public health emergency.  Safety protocols were in place, including screening questions prior to the visit, additional usage of staff PPE, and  extensive cleaning of exam room while observing appropriate contact time as indicated for disinfecting solutions.   COVID 19 screen:  No recent travel or known exposure to COVID19 The patient denies respiratory symptoms of COVID 19 at this time. The importance of social distancing was discussed today.     Review of Systems  Constitutional: Negative for chills and fever.  HENT: Negative for congestion and ear pain.   Eyes: Negative for pain and redness.  Respiratory: Negative for cough and shortness of breath.   Cardiovascular: Negative for chest pain, palpitations and leg swelling.  Gastrointestinal: Negative for abdominal pain, blood in stool, constipation, diarrhea, nausea and vomiting.  Genitourinary: Negative for dysuria.  Musculoskeletal: Negative for falls and myalgias.  Skin: Negative for rash.  Neurological: Negative for dizziness.  Psychiatric/Behavioral: Negative for depression. The patient is not nervous/anxious.       Past Medical History:  Diagnosis Date  . Basal cell carcinoma    Bilateral Legs  . CAD (coronary artery disease)    a.  stent to the RCA in 2001;  b.  Negative Myoview in January 2012;  c.  LHC 4/16:  dLM 20, small D1 50-70, D2 sub-total, oLCx 50-70, RCA stent ok, dPLB 90, EF 35-40% >> DES to D2  . Depression   . History of echocardiogram    a.  Echo 4/16: Mild LVH, EF 15-40%, grade 1 diastolic dysfunction, normal wall motion, MAC  . HLD (hyperlipidemia)   . HTN (hypertension)   . OSA (obstructive sleep apnea)   .  Vertigo     reports that he quit smoking about 19 years ago. His smoking use included cigarettes. He has a 25.00 pack-year smoking history. He has never used smokeless tobacco. He reports current alcohol use of about 3.0 standard drinks of alcohol per week. He reports that he does not use drugs.   Current Outpatient Medications:  .  acetaminophen (TYLENOL) 325 MG tablet, Take 2 tablets (650 mg total) by mouth every 6 (six) hours as needed.,  Disp: 30 tablet, Rfl: 0 .  carvedilol (COREG) 12.5 MG tablet, TAKE 1 TABLET BY MOUTH TWICE A DAY, Disp: 60 tablet, Rfl: 0 .  clopidogrel (PLAVIX) 75 MG tablet, TAKE 1 TABLET BY MOUTH EVERY DAY (Patient taking differently: Take 75 mg by mouth daily. ), Disp: 90 tablet, Rfl: 3 .  feeding supplement, ENSURE ENLIVE, (ENSURE ENLIVE) LIQD, Take 237 mLs by mouth 2 (two) times daily between meals., Disp: 237 mL, Rfl: 12 .  loperamide (IMODIUM) 2 MG capsule, Take 1 capsule (2 mg total) by mouth 2 (two) times daily., Disp: 30 capsule, Rfl: 0 .  Magnesium Oxide 400 MG CAPS, Take 1 capsule (400 mg total) by mouth daily., Disp: 30 capsule, Rfl: 0 .  mesalamine (LIALDA) 1.2 g EC tablet, Take 4 tablets (4.8 g total) by mouth daily with breakfast., Disp: 120 tablet, Rfl: 1 .  nitroGLYCERIN (NITROSTAT) 0.4 MG SL tablet, Place 1 tablet (0.4 mg total) under the tongue every 5 (five) minutes x 3 doses as needed for chest pain., Disp: 30 tablet, Rfl: 0 .  Nutritional Supplements (,FEEDING SUPPLEMENT, PROSOURCE PLUS) liquid, Take 30 mLs by mouth at bedtime., Disp: 900 mL, Rfl: 0 .  omeprazole (PRILOSEC) 20 MG capsule, TAKE 1 CAPSULE BY MOUTH EVERY DAY, Disp: 30 capsule, Rfl: 0 .  ondansetron (ZOFRAN) 4 MG tablet, Take 1 tablet (4 mg total) by mouth every 6 (six) hours as needed for nausea., Disp: 20 tablet, Rfl: 0 .  oxyCODONE (OXY IR/ROXICODONE) 5 MG immediate release tablet, Take 1 tablet (5 mg total) by mouth every 6 (six) hours as needed for severe pain or breakthrough pain., Disp: 15 tablet, Rfl: 0 .  polycarbophil (FIBERCON) 625 MG tablet, Take 1 tablet (625 mg total) by mouth daily for 10 days., Disp: 10 tablet, Rfl: 0 .  potassium chloride 20 MEQ TBCR, Take 20 mEq by mouth daily. Take 2 pills (40 mEq) for a week then continue taking 20 mEq daily.Check potassium  level in a week (Patient taking differently: Take 20 mEq by mouth daily. 20 mEq, Oral, Daily, Take 2 pills (40 mEq) for a week then continue taking 20 mEq  daily.Check potassium  level in a week), Disp: 60 tablet, Rfl: 0 .  venlafaxine XR (EFFEXOR-XR) 37.5 MG 24 hr capsule, Take in addition to 75 mg tablets (Patient taking differently: Take 37.5 mg by mouth See admin instructions. Take in addition to 75 mg tablets), Disp: 30 capsule, Rfl: 5 .  venlafaxine XR (EFFEXOR-XR) 75 MG 24 hr capsule, TAKE 1 CAPSULE (75 MG TOTAL) BY MOUTH DAILY WITH BREAKFAST., Disp: 90 capsule, Rfl: 1 .  dicyclomine (BENTYL) 20 MG tablet, Take 1 tablet (20 mg total) by mouth every 6 (six) hours as needed (abdominal pain). (Patient not taking: Reported on 09/15/2019), Disp: 30 tablet, Rfl: 0   Observations/Objective: Blood pressure 120/80, pulse 100, temperature 97.8 F (36.6 C), temperature source Temporal, height _0  (1.676 m), weight 145 lb 8 oz (66 kg), SpO2 98 %.  Physical Exam Constitutional:  Appearance: He is well-developed. He is not ill-appearing or toxic-appearing.     Comments: fatigued appearing  HENT:     Head: Normocephalic.     Right Ear: Hearing normal.     Left Ear: Hearing normal.     Nose: Nose normal.  Neck:     Thyroid: No thyroid mass or thyromegaly.     Vascular: No carotid bruit.     Trachea: Trachea normal.  Cardiovascular:     Rate and Rhythm: Normal rate and regular rhythm.     Pulses: Normal pulses.     Heart sounds: Heart sounds not distant. No murmur heard.  No friction rub. No gallop.      Comments: No peripheral edema Pulmonary:     Effort: Pulmonary effort is normal. No respiratory distress.     Breath sounds: Normal breath sounds.  Abdominal:     General: Abdomen is flat. Bowel sounds are normal. There is no distension.     Tenderness: There is no abdominal tenderness.     Comments: Ostomy bag in place... no erythema.  Skin:    General: Skin is warm and dry.     Findings: No rash.  Neurological:     Mental Status: He is alert.  Psychiatric:        Speech: Speech normal.        Behavior: Behavior normal.         Thought Content: Thought content normal.      Assessment and Plan Insomnia Try  OTC melatonin 3 mg at bedtime.  Try not to nap in daytime to get back on healthy sleep schedule.   Large bowel obstruction (HCC) S/P  patrial colectomy.  Healing well, improving but still issues with nutrition and deconditioning.  Hypotension IMproved with hydration.  Depression, major, recurrent ([place]) IMproved  Control on effexor.  Controlled type 2 diabetes mellitus with hyperglycemia (HCC) Lab Results  Component Value Date   HGBA1C 6.2 (H) 07/23/2019   Stable glycemic control post op.  Protein-calorie malnutrition, severe Encouraged po intake and nutritional supplements.     Eliezer Lofts, MD

## 2019-09-15 NOTE — Patient Outreach (Signed)
Aging Gracefully Program  09/15/2019  BERNIE RANSFORD [birth year] [id number]  Made an outreach to the patient today to complete AG evaluation. Patient stated that he does not think he would be qualified for this program. He received information in the mail from [employer] and realized he would not be qualified. Explained to the patient what the AG program was and what it offered but patient declined the need for RN and OT services.  Will send message to [facility] with this information.  Raymond Assistant

## 2019-09-16 ENCOUNTER — Telehealth: Payer: Self-pay

## 2019-09-16 ENCOUNTER — Other Ambulatory Visit: Payer: Self-pay | Admitting: Family Medicine

## 2019-09-16 ENCOUNTER — Other Ambulatory Visit: Payer: Self-pay | Admitting: *Deleted

## 2019-09-16 ENCOUNTER — Encounter: Payer: Self-pay | Admitting: *Deleted

## 2019-09-16 DIAGNOSIS — Z933 Colostomy status: Secondary | ICD-10-CM | POA: Diagnosis not present

## 2019-09-16 NOTE — Patient Outreach (Signed)
[facility]) Care Management  09/16/2019  Roy [name] 10-11-1945 [id number]    Outreach attempt #3, successful.  Identity verified.  This care manager introduced self and stated purpose of call.  [name] care management services explained.    Social: Member lives alone, state he is mostly independent with ADL's, able to cook some, has not been able to shower yet due to surgery but is able to take baths.  Report he has been able to prepare small meals.  His sons were in the home with him immediately after discharge but have since left.  His ex-wife is also helping with management of healthcare.  Confirms he has Nurse, learning disability for nursing and ostomy care/teaching, feels he would benefit from PT/OT evaluation.  This was recommended and on his AVS but no evaluation yet.  Call was placed to PCP office to request additional orders.  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  Conditions: Per chart, has history of HTN, CAD, GERD, colitis, controlled DM, and dyslipidemia.  Medications: Reviewed with member, state he has been able to manage them independently.  State he has a system that he uses rather than a pill box.  He does have some concern regarding the cost of his Mesalamine, but state he is "ok" for right now.  State if he has to be on it long term it will be an issue.  Appointments: Had follow up with PCP yesterday and will have surgical follow up on 9/10.  Also scheduled to see cardiologist on 9/23.  He is not driving again yet but his ex-wife has been providing transportation.  Denies any urgent concerns at this time, encouraged to contact this care manager with questions.  Plan: RN CM will follow up with member within the next 2 weeks, will follow up with PCP office and/or [facility] within the next week.  Goals Addressed              This Visit's Progress   .  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 Over the next 31 days, patient will not experience hospital admission. Hospital Admissions in last 6 months = 3 . Over the next 28 days, patient will attend all scheduled medical appointments: surgeon and cardiology  Interventions:  . Inter-disciplinary care team collaboration (see longitudinal plan of care) . Provided education to patient re: ostomy care and signs/symptoms of infection . Discussed plans with patient for ongoing care management follow up and provided patient with direct contact information for care management team . Reviewed scheduled/upcoming provider appointments including:   Patient Self Care Activities:  . Attends all scheduled provider appointments . Performs ADL's independently   Initial goal documentation       Roy David, RN, MSN [place] [phone]

## 2019-09-16 NOTE — Telephone Encounter (Signed)
Call patient. At recent [place] pt refused PT/OT services. I will send order if patient agreeable for services now.

## 2019-09-16 NOTE — Telephone Encounter (Signed)
Roy [name] with [facility] left v/m that pt was d/c from hospital with colostomy;pt has been seeing [place] is requesting verbal orders for [facility] OT & PT evaluation.

## 2019-09-20 ENCOUNTER — Telehealth: Payer: Self-pay | Admitting: *Deleted

## 2019-09-20 ENCOUNTER — Telehealth: Payer: Self-pay

## 2019-09-20 DIAGNOSIS — R531 Weakness: Secondary | ICD-10-CM

## 2019-09-20 DIAGNOSIS — F339 Major depressive disorder, recurrent, unspecified: Secondary | ICD-10-CM

## 2019-09-20 DIAGNOSIS — K5730 Diverticulosis of large intestine without perforation or abscess without bleeding: Secondary | ICD-10-CM | POA: Diagnosis not present

## 2019-09-20 DIAGNOSIS — K9409 Other complications of colostomy: Secondary | ICD-10-CM | POA: Diagnosis not present

## 2019-09-20 DIAGNOSIS — G4733 Obstructive sleep apnea (adult) (pediatric): Secondary | ICD-10-CM | POA: Diagnosis not present

## 2019-09-20 DIAGNOSIS — R69 Illness, unspecified: Secondary | ICD-10-CM | POA: Diagnosis not present

## 2019-09-20 DIAGNOSIS — Z9181 History of falling: Secondary | ICD-10-CM

## 2019-09-20 DIAGNOSIS — Z9049 Acquired absence of other specified parts of digestive tract: Secondary | ICD-10-CM

## 2019-09-20 DIAGNOSIS — M479 Spondylosis, unspecified: Secondary | ICD-10-CM | POA: Diagnosis not present

## 2019-09-20 DIAGNOSIS — Z87891 Personal history of nicotine dependence: Secondary | ICD-10-CM

## 2019-09-20 DIAGNOSIS — E785 Hyperlipidemia, unspecified: Secondary | ICD-10-CM | POA: Diagnosis not present

## 2019-09-20 DIAGNOSIS — E43 Unspecified severe protein-calorie malnutrition: Secondary | ICD-10-CM

## 2019-09-20 DIAGNOSIS — I2510 Atherosclerotic heart disease of native coronary artery without angina pectoris: Secondary | ICD-10-CM | POA: Diagnosis not present

## 2019-09-20 DIAGNOSIS — Z85828 Personal history of other malignant neoplasm of skin: Secondary | ICD-10-CM

## 2019-09-20 DIAGNOSIS — Z7902 Long term (current) use of antithrombotics/antiplatelets: Secondary | ICD-10-CM

## 2019-09-20 DIAGNOSIS — I119 Hypertensive heart disease without heart failure: Secondary | ICD-10-CM | POA: Diagnosis not present

## 2019-09-20 DIAGNOSIS — Z955 Presence of coronary angioplasty implant and graft: Secondary | ICD-10-CM

## 2019-09-20 DIAGNOSIS — J9811 Atelectasis: Secondary | ICD-10-CM

## 2019-09-20 DIAGNOSIS — Z48815 Encounter for surgical aftercare following surgery on the digestive system: Secondary | ICD-10-CM | POA: Diagnosis not present

## 2019-09-20 DIAGNOSIS — I700 Atherosclerosis of aorta: Secondary | ICD-10-CM | POA: Diagnosis not present

## 2019-09-20 NOTE — Telephone Encounter (Signed)
Pt has cardiology OV for tachycardia.  Referral for  [facility] PT sen given patient has agreed to referral.

## 2019-09-20 NOTE — Telephone Encounter (Signed)
Spoke with Roy [name] and confirmed that he is agreeable for [name] PT.  Spoke with Roy [name] with [name] and gave verbal orders for for nursing to be changed to twice a week for two weeks and once a week for two weeks to help with ostomy bag.   Verbal orders also given for [name] PT to help with walking because of his weakness.

## 2019-09-20 NOTE — Telephone Encounter (Signed)
Spoke to patient who reports seeing his Potassium Chloride 20 mEq tablet in his colostomy bag. He states this has been noted twice. Patient was advised to continue to monitor this. He will also contact his PCP to discus if a smaller potassium tablet with the same strength was available.

## 2019-09-20 NOTE — Telephone Encounter (Signed)
Beth nurse with [name] left a voicemail stating that she is requesting additional orders for nursing to be changed to twice a week for two weeks and once a week for two weeks. [place] stating that patient lives alone and is having difficulty with his ostomy bag.  Beth stated that patient is requesting PT to help with walking because of his weakness.  Beth stated that patient is having tachycardia when she is seeing him with heart rate around 98-102. Beth stated that patient is telling her that she is drinking plenty of water. Beth stated that there is not any indication of an infection.

## 2019-09-22 DIAGNOSIS — R69 Illness, unspecified: Secondary | ICD-10-CM | POA: Diagnosis not present

## 2019-09-22 DIAGNOSIS — I2510 Atherosclerotic heart disease of native coronary artery without angina pectoris: Secondary | ICD-10-CM | POA: Diagnosis not present

## 2019-09-22 DIAGNOSIS — M479 Spondylosis, unspecified: Secondary | ICD-10-CM | POA: Diagnosis not present

## 2019-09-22 DIAGNOSIS — K9409 Other complications of colostomy: Secondary | ICD-10-CM | POA: Diagnosis not present

## 2019-09-22 DIAGNOSIS — I119 Hypertensive heart disease without heart failure: Secondary | ICD-10-CM | POA: Diagnosis not present

## 2019-09-22 DIAGNOSIS — G4733 Obstructive sleep apnea (adult) (pediatric): Secondary | ICD-10-CM | POA: Diagnosis not present

## 2019-09-22 DIAGNOSIS — I700 Atherosclerosis of aorta: Secondary | ICD-10-CM | POA: Diagnosis not present

## 2019-09-22 DIAGNOSIS — E785 Hyperlipidemia, unspecified: Secondary | ICD-10-CM | POA: Diagnosis not present

## 2019-09-22 DIAGNOSIS — K5730 Diverticulosis of large intestine without perforation or abscess without bleeding: Secondary | ICD-10-CM | POA: Diagnosis not present

## 2019-09-22 DIAGNOSIS — Z48815 Encounter for surgical aftercare following surgery on the digestive system: Secondary | ICD-10-CM | POA: Diagnosis not present

## 2019-09-23 NOTE — Telephone Encounter (Signed)
Spoke with patient this morning regarding holding his Plavix five days prior to colonoscopy.  Per Dr Johnsie Cancel okay to hold.  Patient verbalized understanding.

## 2019-09-26 ENCOUNTER — Other Ambulatory Visit: Payer: Self-pay | Admitting: *Deleted

## 2019-09-26 DIAGNOSIS — R0789 Other chest pain: Secondary | ICD-10-CM | POA: Insufficient documentation

## 2019-09-26 DIAGNOSIS — K9409 Other complications of colostomy: Secondary | ICD-10-CM | POA: Diagnosis not present

## 2019-09-26 DIAGNOSIS — E785 Hyperlipidemia, unspecified: Secondary | ICD-10-CM | POA: Diagnosis not present

## 2019-09-26 DIAGNOSIS — I119 Hypertensive heart disease without heart failure: Secondary | ICD-10-CM | POA: Diagnosis not present

## 2019-09-26 DIAGNOSIS — Z48815 Encounter for surgical aftercare following surgery on the digestive system: Secondary | ICD-10-CM | POA: Diagnosis not present

## 2019-09-26 DIAGNOSIS — K5730 Diverticulosis of large intestine without perforation or abscess without bleeding: Secondary | ICD-10-CM | POA: Diagnosis not present

## 2019-09-26 DIAGNOSIS — R69 Illness, unspecified: Secondary | ICD-10-CM | POA: Diagnosis not present

## 2019-09-26 DIAGNOSIS — I700 Atherosclerosis of aorta: Secondary | ICD-10-CM | POA: Diagnosis not present

## 2019-09-26 DIAGNOSIS — G4733 Obstructive sleep apnea (adult) (pediatric): Secondary | ICD-10-CM | POA: Diagnosis not present

## 2019-09-26 DIAGNOSIS — Z933 Colostomy status: Secondary | ICD-10-CM | POA: Diagnosis not present

## 2019-09-26 DIAGNOSIS — M479 Spondylosis, unspecified: Secondary | ICD-10-CM | POA: Diagnosis not present

## 2019-09-26 DIAGNOSIS — I2510 Atherosclerotic heart disease of native coronary artery without angina pectoris: Secondary | ICD-10-CM | POA: Diagnosis not present

## 2019-09-26 NOTE — Assessment & Plan Note (Signed)
Appears to be poorly controlled and may be associated with poor self care. INcrease venlafaxine.

## 2019-09-26 NOTE — Assessment & Plan Note (Signed)
Continue oral KCL and Mg.

## 2019-09-26 NOTE — Assessment & Plan Note (Signed)
Hold Flomax unless  urinary retention given BP droppiong low. Push fluids as discussed.

## 2019-09-26 NOTE — Patient Outreach (Signed)
[facility]) Care Management  09/26/2019  Roy [name] 1945/05/20 [id number]   Call placed to member to follow up on member's ongoing recovery.  He state he has been doing well, was seen by surgeon on 9/10, incision healing, no signs/symptoms of infection.  Follow up will be within the next 2 months.  Will discuss ostomy reversal at that time.  Report he had been having trouble with ostomy bag placement due to where the staples are but has worked with home health nurse to better placement.  Denies any leaks/irritation.  State nurse will be in the home today for re-assessment.  Also confirms he has started with PT, first session was last week, will have at least 2 more sessions.  Report his appetite is getting better, weight has increased from 145 pounds to about 150 pounds.  He has a call out to his PCP office regarding some medications he will run out of in the next couple days (magnesium oxide, loperamide, and mesalamine).  He will also contact his GI/surgeon office to inquire if refills are needed.  Denies any urgent concerns at this time, will follow up within the next month.  Goals Addressed              This Visit's Progress   .  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 Over the next 31 days, patient will not experience hospital admission. Hospital Admissions in last 6 months = 3 . Over the next 28 days, patient will attend all scheduled medical appointments: surgeon and cardiology  Interventions:  . Inter-disciplinary care team collaboration (see longitudinal plan of care) . Provided education to patient re: ostomy care and signs/symptoms of  infection . Discussed plans with patient for ongoing care management follow up and provided patient with direct contact information for care management team . Reviewed scheduled/upcoming provider appointments including:   Patient Self Care Activities:  . Attends all scheduled provider appointments . Performs ADL's independently   Initial goal documentation       Roy David, RN, MSN [place] [phone]

## 2019-09-26 NOTE — Assessment & Plan Note (Signed)
Pt with history of CAD reports new chest pressure with exertion in last 24-48 hours.  Will contact cardiology for recommendations.

## 2019-09-26 NOTE — Assessment & Plan Note (Addendum)
Continues to bother patient.  Can use tylenol for pain. Make sure taking lialda as recommended.

## 2019-09-26 NOTE — Assessment & Plan Note (Signed)
Set container aside and make goal of 64 oz  A day. Make sure getting three meals a day with sncks add Boost supplements.

## 2019-09-28 DIAGNOSIS — Z933 Colostomy status: Secondary | ICD-10-CM | POA: Diagnosis not present

## 2019-09-28 DIAGNOSIS — E785 Hyperlipidemia, unspecified: Secondary | ICD-10-CM | POA: Diagnosis not present

## 2019-09-28 DIAGNOSIS — K9409 Other complications of colostomy: Secondary | ICD-10-CM | POA: Diagnosis not present

## 2019-09-28 DIAGNOSIS — G4733 Obstructive sleep apnea (adult) (pediatric): Secondary | ICD-10-CM | POA: Diagnosis not present

## 2019-09-28 DIAGNOSIS — I119 Hypertensive heart disease without heart failure: Secondary | ICD-10-CM | POA: Diagnosis not present

## 2019-09-28 DIAGNOSIS — Z48815 Encounter for surgical aftercare following surgery on the digestive system: Secondary | ICD-10-CM | POA: Diagnosis not present

## 2019-09-28 DIAGNOSIS — K5730 Diverticulosis of large intestine without perforation or abscess without bleeding: Secondary | ICD-10-CM | POA: Diagnosis not present

## 2019-09-28 DIAGNOSIS — I2510 Atherosclerotic heart disease of native coronary artery without angina pectoris: Secondary | ICD-10-CM | POA: Diagnosis not present

## 2019-09-28 DIAGNOSIS — I700 Atherosclerosis of aorta: Secondary | ICD-10-CM | POA: Diagnosis not present

## 2019-09-28 DIAGNOSIS — R69 Illness, unspecified: Secondary | ICD-10-CM | POA: Diagnosis not present

## 2019-09-28 DIAGNOSIS — M479 Spondylosis, unspecified: Secondary | ICD-10-CM | POA: Diagnosis not present

## 2019-09-29 DIAGNOSIS — I119 Hypertensive heart disease without heart failure: Secondary | ICD-10-CM | POA: Diagnosis not present

## 2019-09-29 DIAGNOSIS — I2510 Atherosclerotic heart disease of native coronary artery without angina pectoris: Secondary | ICD-10-CM | POA: Diagnosis not present

## 2019-09-29 DIAGNOSIS — R69 Illness, unspecified: Secondary | ICD-10-CM | POA: Diagnosis not present

## 2019-09-29 DIAGNOSIS — Z48815 Encounter for surgical aftercare following surgery on the digestive system: Secondary | ICD-10-CM | POA: Diagnosis not present

## 2019-09-29 DIAGNOSIS — K9409 Other complications of colostomy: Secondary | ICD-10-CM | POA: Diagnosis not present

## 2019-09-29 DIAGNOSIS — I700 Atherosclerosis of aorta: Secondary | ICD-10-CM | POA: Diagnosis not present

## 2019-09-29 DIAGNOSIS — M479 Spondylosis, unspecified: Secondary | ICD-10-CM | POA: Diagnosis not present

## 2019-09-29 DIAGNOSIS — K5730 Diverticulosis of large intestine without perforation or abscess without bleeding: Secondary | ICD-10-CM | POA: Diagnosis not present

## 2019-09-29 DIAGNOSIS — E785 Hyperlipidemia, unspecified: Secondary | ICD-10-CM | POA: Diagnosis not present

## 2019-09-29 DIAGNOSIS — G4733 Obstructive sleep apnea (adult) (pediatric): Secondary | ICD-10-CM | POA: Diagnosis not present

## 2019-09-29 NOTE — Telephone Encounter (Signed)
Dr. Lyndel Safe, see my chart msg from patient. He wanted to know if you wanted him to continue taking Mesalamine or not post sigmoid resection. He is about to run out of his prescription. His pathology results showed: A. COLON, SIGMOID, RESECTION:  - Diverticulosis with acute diverticulitis, abscess formation and  transmural defect.  - Five morphologically benign lymph nodes.   Also, pls let Larena Glassman know when you want to see him for a follow up appointment.   Thank you.

## 2019-10-05 ENCOUNTER — Telehealth: Payer: Self-pay | Admitting: *Deleted

## 2019-10-05 DIAGNOSIS — E785 Hyperlipidemia, unspecified: Secondary | ICD-10-CM | POA: Diagnosis not present

## 2019-10-05 DIAGNOSIS — I119 Hypertensive heart disease without heart failure: Secondary | ICD-10-CM | POA: Diagnosis not present

## 2019-10-05 DIAGNOSIS — I2510 Atherosclerotic heart disease of native coronary artery without angina pectoris: Secondary | ICD-10-CM | POA: Diagnosis not present

## 2019-10-05 DIAGNOSIS — K5730 Diverticulosis of large intestine without perforation or abscess without bleeding: Secondary | ICD-10-CM | POA: Diagnosis not present

## 2019-10-05 DIAGNOSIS — Z48815 Encounter for surgical aftercare following surgery on the digestive system: Secondary | ICD-10-CM | POA: Diagnosis not present

## 2019-10-05 DIAGNOSIS — G4733 Obstructive sleep apnea (adult) (pediatric): Secondary | ICD-10-CM | POA: Diagnosis not present

## 2019-10-05 DIAGNOSIS — I700 Atherosclerosis of aorta: Secondary | ICD-10-CM | POA: Diagnosis not present

## 2019-10-05 DIAGNOSIS — R69 Illness, unspecified: Secondary | ICD-10-CM | POA: Diagnosis not present

## 2019-10-05 DIAGNOSIS — K9409 Other complications of colostomy: Secondary | ICD-10-CM | POA: Diagnosis not present

## 2019-10-05 DIAGNOSIS — M479 Spondylosis, unspecified: Secondary | ICD-10-CM | POA: Diagnosis not present

## 2019-10-05 NOTE — Telephone Encounter (Signed)
Okay to give verbal order as requested.

## 2019-10-05 NOTE — Telephone Encounter (Signed)
Beth nurse with [facility] [name] left a voicemail stating that she is out doing home care on patient.[name] stated that patient has a new ostomy. Beth stated that she needs at least one more week possibly two or three more. Beth stated that patient was doing fairly well the last few times that she saw him. Beth stated that patient has had some redness around the stoma. Beth stated that she was suppose to see the patient tomorrow but he has a doctor's appointment so her visit has been changed to next week. Beth wants a call back with verbal order.

## 2019-10-05 NOTE — Telephone Encounter (Signed)
Left message for Beth giving verbal orders to continue home health for ostomy care per Dr. Diona Browner.

## 2019-10-06 ENCOUNTER — Other Ambulatory Visit: Payer: Self-pay

## 2019-10-06 ENCOUNTER — Ambulatory Visit: Payer: Medicare HMO | Admitting: Physician Assistant

## 2019-10-06 ENCOUNTER — Encounter: Payer: Self-pay | Admitting: Physician Assistant

## 2019-10-06 VITALS — BP 120/78 | HR 83 | Ht 66.0 in | Wt 158.0 lb

## 2019-10-06 DIAGNOSIS — E785 Hyperlipidemia, unspecified: Secondary | ICD-10-CM

## 2019-10-06 DIAGNOSIS — I10 Essential (primary) hypertension: Secondary | ICD-10-CM

## 2019-10-06 DIAGNOSIS — Z933 Colostomy status: Secondary | ICD-10-CM | POA: Diagnosis not present

## 2019-10-06 DIAGNOSIS — I2510 Atherosclerotic heart disease of native coronary artery without angina pectoris: Secondary | ICD-10-CM | POA: Diagnosis not present

## 2019-10-06 NOTE — Progress Notes (Signed)
Cardiology Office Note    Date:  10/06/2019   ID:  Roy, [name] [birth year], MRN [id number]  PCP:  Jinny Sanders, MD  Cardiologist:  Dr. Johnsie Cancel  Chief Complaint: hospital follow up  History of Present Illness:   Roy [name] is a 74 y.o. 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  History of stenting to RCA in 2001.  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  Last Myoview in 2018 was nonischemic.  Admitted summer 2021 with large bowel obstruction with diverticulitis..  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  Here today for follow-up.  Chest pain, shortness of breath, orthopnea, PND, syncope, lower extremity edema or melena.  No bleeding in colostomy.  Past Medical History:  Diagnosis Date  . Basal cell carcinoma    Bilateral Legs  . CAD (coronary artery disease)    a.  stent to the RCA in 2001;  b.  Negative Myoview in January 2012;  c.  LHC 4/16:  dLM 20, small D1 50-70, D2 sub-total, oLCx 50-70, RCA stent ok, dPLB 90, EF 35-40% >> DES to D2  . Depression   . History of echocardiogram    a.  Echo 4/16: Mild LVH, EF 62-13%, grade 1 diastolic dysfunction, normal wall motion, MAC  . HLD (hyperlipidemia)   . HTN (hypertension)   . OSA (obstructive sleep apnea)   . Vertigo     Past Surgical History:  Procedure Laterality Date  . BIOPSY  07/26/2019   Procedure: BIOPSY;  Surgeon: Gatha Mayer, MD;  Location: [place];  Service: Endoscopy;;  . CATARACT EXTRACTION, BILATERAL    . COLON RESECTION SIGMOID N/A 08/30/2019   Procedure: SIGMOID COLON RESECTION;  Surgeon: Clovis Riley, MD;  Location: [place];  Service: General;  Laterality: N/A;  . COLONOSCOPY N/A 07/26/2019   Procedure: COLONOSCOPY;  Surgeon: Gatha Mayer, MD;  Location: [facility] ENDOSCOPY;   Service: Endoscopy;  Laterality: N/A;  . COLONOSCOPY    . COLOSTOMY N/A 08/30/2019   Procedure: COLOSTOMY;  Surgeon: Clovis Riley, MD;  Location: [place];  Service: General;  Laterality: N/A;  . CORONARY STENT PLACEMENT  2001    RCA  . LEFT HEART CATHETERIZATION WITH CORONARY ANGIOGRAM N/A 02/13/2012   Procedure: LEFT HEART CATHETERIZATION WITH CORONARY ANGIOGRAM;  Surgeon: [name], MD;  Location: [facility] CATH LAB;  Service: Cardiovascular;  Laterality: N/A;  . LEFT HEART CATHETERIZATION WITH CORONARY ANGIOGRAM N/A 04/24/2014   Procedure: LEFT HEART CATHETERIZATION WITH CORONARY ANGIOGRAM;  Surgeon: [name], MD;  Location: [facility] CATH LAB;  Service: Cardiovascular;  Laterality: N/A;  . TONSILLECTOMY      Current Medications: Prior to Admission medications   Medication Sig Start Date End Date Taking? Authorizing Provider  acetaminophen (TYLENOL) 325 MG tablet Take 2 tablets (650 mg total) by mouth every 6 (six) hours as needed. 09/06/19   Maczis, Barth Kirks, PA-C  carvedilol (COREG) 12.5 MG tablet TAKE 1 TABLET BY MOUTH TWICE A DAY 09/15/19   Josue Hector, MD  clopidogrel (PLAVIX) 75 MG tablet TAKE 1 TABLET BY MOUTH EVERY DAY Patient taking differently: Take 75 mg by mouth daily.  10/12/18   Josue Hector, MD  dicyclomine (BENTYL) 20 MG tablet Take 1 tablet (20 mg total) by mouth every 6 (six) hours as needed (  abdominal pain). 07/28/19   Aline August, MD  feeding supplement, ENSURE ENLIVE, (ENSURE ENLIVE) LIQD Take 237 mLs by mouth 2 (two) times daily between meals. 09/06/19   Darliss Cheney, MD  loperamide (IMODIUM) 2 MG capsule Take 1 capsule (2 mg total) by mouth 2 (two) times daily. 09/06/19   Maczis, Barth Kirks, PA-C  Magnesium Oxide 400 MG CAPS Take 1 capsule (400 mg total) by mouth daily. 08/15/19   Shelly Coss, MD  mesalamine (LIALDA) 1.2 g EC tablet Take 4 tablets (4.8 g total) by mouth daily with breakfast. 08/16/19   Shelly Coss, MD  nitroGLYCERIN (NITROSTAT) 0.4 MG SL  tablet Place 1 tablet (0.4 mg total) under the tongue every 5 (five) minutes x 3 doses as needed for chest pain. 07/28/19   Aline August, MD  Nutritional Supplements (,FEEDING SUPPLEMENT, PROSOURCE PLUS) liquid Take 30 mLs by mouth at bedtime. 09/06/19 10/06/19  Darliss Cheney, MD  omeprazole (PRILOSEC) 20 MG capsule TAKE 1 CAPSULE BY MOUTH EVERY DAY 09/15/19   Josue Hector, MD  ondansetron (ZOFRAN) 4 MG tablet Take 1 tablet (4 mg total) by mouth every 6 (six) hours as needed for nausea. 09/06/19   Maczis, Barth Kirks, PA-C  oxyCODONE (OXY IR/ROXICODONE) 5 MG immediate release tablet Take 1 tablet (5 mg total) by mouth every 6 (six) hours as needed for severe pain or breakthrough pain. 09/06/19   Maczis, Barth Kirks, PA-C  potassium chloride 20 MEQ TBCR Take 20 mEq by mouth daily. Take 2 pills (40 mEq) for a week then continue taking 20 mEq daily.Check potassium  level in a week Patient taking differently: Take 20 mEq by mouth daily. 20 mEq, Oral, Daily, Take 2 pills (40 mEq) for a week then continue taking 20 mEq daily.Check potassium  level in a week 08/15/19   Shelly Coss, MD  venlafaxine XR (EFFEXOR-XR) 37.5 MG 24 hr capsule TAKE IN ADDITION TO 75 MG TABLETS 09/20/19   [name], [name], MD  venlafaxine XR (EFFEXOR-XR) 75 MG 24 hr capsule TAKE 1 CAPSULE (75 MG TOTAL) BY MOUTH DAILY WITH BREAKFAST. 06/20/19   [name], [name], MD    Allergies:   Patient has no known allergies.   Social History   Socioeconomic History  . Marital status: Divorced    Spouse name: Not on file  . Number of children: Not on file  . Years of education: Not on file  . Highest education level: Not on file  Occupational History  . Not on file  Tobacco Use  . Smoking status: Former Smoker    Packs/day: 1.00    Years: 25.00    Pack years: 25.00    Types: Cigarettes    Quit date: 01/14/2000    Years since quitting: 19.7  . Smokeless tobacco: Never Used  Vaping Use  . Vaping Use: Never used  Substance and Sexual Activity  .  Alcohol use: Yes    Alcohol/week: 3.0 standard drinks    Types: 1 Cans of beer, 2 Shots of liquor per week  . Drug use: No  . Sexual activity: Not Currently  Other Topics Concern  . 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   Social Determinants of Health   Financial Resource Strain:   . Difficulty of Paying Living Expenses: Not on file  Food Insecurity: No Food Insecurity  . Worried About [employer] of  Food in the Last Year: Never true  . Ran Out of Food in the Last Year: Never true  Transportation Needs: No Transportation Needs  . Lack of Transportation (Medical): No  . Lack of Transportation (Non-Medical): No  Physical Activity:   . Days of Exercise per Week: Not on file  . Minutes of Exercise per Session: Not on file  Stress:   . Feeling of Stress : Not on file  Social Connections:   . Frequency of Communication with Friends and Family: Not on file  . Frequency of Social Gatherings with Friends and Family: Not on file  . Attends Religious Services: Not on file  . Active Member of Clubs or Organizations: Not on file  . Attends Archivist Meetings: Not on file  . Marital Status: Not on file     Family History:  The patient's family history includes Alcohol abuse in his mother; Cirrhosis in his mother; Coronary artery disease in his father; Depression in his sister; Heart disease in his father; Hypertension in his father; Pancreatic cancer in his mother; Peripheral vascular disease in his father.   ROS:   Please see the history of present illness.    ROS All other systems reviewed and are negative.   PHYSICAL EXAM:   VS:  BP 120/78   Pulse 83   Ht _0  (1.676 m)   Wt 158 lb (71.7 kg)   SpO2 98%   BMI 25.50 kg/m    GEN: Well nourished, well developed, in no acute distress  HEENT: normal  Neck: no JVD, carotid bruits, or masses Cardiac: RRR; no murmurs,  rubs, or gallops,no edema  Respiratory:  clear to auscultation bilaterally, normal work of breathing GI: soft, colostomy bag in place + BS MS: no deformity or atrophy  Skin: warm and dry, no rash Neuro:  Alert and Oriented x 3, Strength and sensation are intact Psych: euthymic mood, full affect  Wt Readings from Last 3 Encounters:  10/06/19 158 lb (71.7 kg)  09/15/19 145 lb 8 oz (66 kg)  08/26/19 150 lb (68 kg)      Studies/Labs Reviewed:   EKG:  EKG is not  ordered today.   Recent Labs: 12/02/2018: TSH 2.69 07/26/2019: B Natriuretic Peptide 95.9 09/06/2019: Hemoglobin 9.2; Platelets 253 09/15/2019: ALT 29; BUN 27; Creatinine, Ser 0.94; Magnesium 1.3; Potassium 4.5; Sodium 135   Lipid Panel    Component Value Date/Time   CHOL 198 11/08/2018 0904   CHOL 174 03/26/2016 0736   TRIG 120 07/26/2019 0226   TRIG 130 12/08/2005 0741   HDL 57.30 11/08/2018 0904   HDL 40 03/26/2016 0736   CHOLHDL 3 11/08/2018 0904   VLDL 59.0 (H) 11/08/2018 0904   LDLCALC 65 03/26/2016 0736   LDLDIRECT 97.0 11/08/2018 0904    Additional studies/ records that were reviewed today include:   Echocardiogram: 07/2019 1. Left ventricular ejection fraction, by estimation, is 70 to 75%. The  left ventricle has hyperdynamic function. The left ventricle has no  regional wall motion abnormalities. Left ventricular diastolic parameters  are consistent with Grade I diastolic  dysfunction (impaired relaxation).  2. Right ventricular systolic function is normal. The right ventricular  size is normal. There is normal pulmonary artery systolic pressure.  3. The mitral valve is normal in structure. Trivial mitral valve  regurgitation. No evidence of mitral stenosis.  4. The aortic valve is tricuspid. Aortic valve regurgitation is not  visualized. No aortic stenosis is present.  5. The inferior vena  cava is normal in size with greater than 50%  respiratory variability, suggesting right atrial pressure of 3  mmHg.      ASSESSMENT & PLAN:    1. CAD  No angina. Continue Plavix and BB.   2. HLD - 11/08/2018: Cholesterol 198; HDL 57.30; VLDL 59.0 07/26/2019: Triglycerides 120  - normal LFTS 09/15/2019 - Restart Statin at follow up  3. s/p colectomy with colostomy  - may need reversal in future. He is getting >4 mets of activity currently. He will need phone call for clearance if > 2 months.   4. HTN - BP stale on current medications    Medication Adjustments/Labs and Tests Ordered: Current medicines are reviewed at length with the patient today.  Concerns regarding medicines are outlined above.  Medication changes, Labs and Tests ordered today are listed in the Patient Instructions below. Patient Instructions  Medication Instructions:  Your physician recommends that you continue on your current medications as directed. Please refer to the Current Medication list given to you today.  *If you need a refill on your cardiac medications before your next appointment, please call your pharmacy*   Lab Work: None ordered  If you have labs (blood work) drawn today and your tests are completely normal, you will receive your results only by: Marland Kitchen MyChart Message (if you have MyChart) OR . A paper copy in the mail If you have any lab test that is abnormal or we need to change your treatment, we will call you to review the results.   Testing/Procedures: None ordered   Follow-Up: At [facility], you and your health needs are our priority.  As part of our continuing mission to provide you with exceptional heart care, we have created designated Provider Care Teams.  These Care Teams include your primary Cardiologist (physician) and Advanced Practice Providers (APPs -  Physician Assistants and Nurse Practitioners) who all work together to provide you with the care you need, when you need it.  We recommend signing up for the patient portal called "MyChart".  Sign up information is provided on this  After Visit Summary.  MyChart is used to connect with patients for Virtual Visits (Telemedicine).  Patients are able to view lab/test results, encounter notes, upcoming appointments, etc.  Non-urgent messages can be sent to your provider as well.   To learn more about what you can do with MyChart, go to [email or web address].    Your next appointment:   6 month(s)  The format for your next appointment:   In Person  Provider:   You may see Jenkins Rouge, MD or one of the following Advanced Practice Providers on your designated Care Team:    Truitt Merle, NP  Cecilie Kicks, NP  Kathyrn Drown, NP    Other Instructions      Signed, Leanor Kail, [place]  10/06/2019 3:16 PM    [facility] [place], [place], [place]  [address] Phone: [phone]; Fax: [phone]

## 2019-10-06 NOTE — Patient Instructions (Signed)
Medication Instructions:  Your physician recommends that you continue on your current medications as directed. Please refer to the Current Medication list given to you today.  *If you need a refill on your cardiac medications before your next appointment, please call your pharmacy*   Lab Work: None ordered  If you have labs (blood work) drawn today and your tests are completely normal, you will receive your results only by:  [place] (if you have MyChart) OR  A paper copy in the mail If you have any lab test that is abnormal or we need to change your treatment, we will call you to review the results.   Testing/Procedures: None ordered   Follow-Up: At [facility], you and your health needs are our priority.  As part of our continuing mission to provide you with exceptional heart care, we have created designated Provider Care Teams.  These Care Teams include your primary Cardiologist (physician) and Advanced Practice Providers (APPs -  Physician Assistants and Nurse Practitioners) who all work together to provide you with the care you need, when you need it.  We recommend signing up for the patient portal called "MyChart".  Sign up information is provided on this After Visit Summary.  MyChart is used to connect with patients for Virtual Visits (Telemedicine).  Patients are able to view lab/test results, encounter notes, upcoming appointments, etc.  Non-urgent messages can be sent to your provider as well.   To learn more about what you can do with MyChart, go to [email or web address].    Your next appointment:   6 month(s)  The format for your next appointment:   In Person  Provider:   You may see Jenkins Rouge, MD or one of the following Advanced Practice Providers on your designated Care Team:    Truitt Merle, NP  Cecilie Kicks, NP  Kathyrn Drown, NP    Other Instructions

## 2019-10-12 DIAGNOSIS — I2510 Atherosclerotic heart disease of native coronary artery without angina pectoris: Secondary | ICD-10-CM | POA: Diagnosis not present

## 2019-10-12 DIAGNOSIS — I700 Atherosclerosis of aorta: Secondary | ICD-10-CM | POA: Diagnosis not present

## 2019-10-12 DIAGNOSIS — K9409 Other complications of colostomy: Secondary | ICD-10-CM | POA: Diagnosis not present

## 2019-10-12 DIAGNOSIS — R69 Illness, unspecified: Secondary | ICD-10-CM | POA: Diagnosis not present

## 2019-10-12 DIAGNOSIS — Z48815 Encounter for surgical aftercare following surgery on the digestive system: Secondary | ICD-10-CM | POA: Diagnosis not present

## 2019-10-12 DIAGNOSIS — K5730 Diverticulosis of large intestine without perforation or abscess without bleeding: Secondary | ICD-10-CM | POA: Diagnosis not present

## 2019-10-12 DIAGNOSIS — E785 Hyperlipidemia, unspecified: Secondary | ICD-10-CM | POA: Diagnosis not present

## 2019-10-12 DIAGNOSIS — M479 Spondylosis, unspecified: Secondary | ICD-10-CM | POA: Diagnosis not present

## 2019-10-12 DIAGNOSIS — G4733 Obstructive sleep apnea (adult) (pediatric): Secondary | ICD-10-CM | POA: Diagnosis not present

## 2019-10-12 DIAGNOSIS — I119 Hypertensive heart disease without heart failure: Secondary | ICD-10-CM | POA: Diagnosis not present

## 2019-10-15 ENCOUNTER — Other Ambulatory Visit: Payer: Self-pay | Admitting: Cardiovascular Disease

## 2019-10-19 ENCOUNTER — Encounter: Payer: Medicare HMO | Admitting: Gastroenterology

## 2019-10-19 ENCOUNTER — Telehealth: Payer: Self-pay | Admitting: *Deleted

## 2019-10-19 NOTE — Assessment & Plan Note (Signed)
Lab Results  Component Value Date   HGBA1C 6.2 (H) 07/23/2019   Stable glycemic control post op.

## 2019-10-19 NOTE — Telephone Encounter (Signed)
Beth nurse with [name] called stating that this is just a FYI for Dr. Diona Browner. Beth stated that she had an appointment scheduled this week with patient and was advised that he does not feel that she needs to come out this week. Beth stated that she has been seeing him once a week for ostomy care. Beth stated that she is planning on going out next week to see the patient and just wanted the doctor to be aware of this.

## 2019-10-19 NOTE — Assessment & Plan Note (Signed)
IMproved with hydration.

## 2019-10-19 NOTE — Assessment & Plan Note (Signed)
S/P  patrial colectomy.  Healing well, improving but still issues with nutrition and deconditioning.

## 2019-10-19 NOTE — Assessment & Plan Note (Signed)
IMproved  Control on effexor.

## 2019-10-19 NOTE — Assessment & Plan Note (Signed)
Encouraged po intake and nutritional supplements.

## 2019-10-19 NOTE — Assessment & Plan Note (Signed)
Try  OTC melatonin 3 mg at bedtime.  Try not to nap in daytime to get back on healthy sleep schedule.

## 2019-10-19 NOTE — Telephone Encounter (Signed)
Noted  

## 2019-10-24 ENCOUNTER — Other Ambulatory Visit: Payer: Self-pay | Admitting: *Deleted

## 2019-10-24 ENCOUNTER — Telehealth: Payer: Self-pay

## 2019-10-24 DIAGNOSIS — E785 Hyperlipidemia, unspecified: Secondary | ICD-10-CM | POA: Diagnosis not present

## 2019-10-24 DIAGNOSIS — Z48815 Encounter for surgical aftercare following surgery on the digestive system: Secondary | ICD-10-CM | POA: Diagnosis not present

## 2019-10-24 DIAGNOSIS — I700 Atherosclerosis of aorta: Secondary | ICD-10-CM | POA: Diagnosis not present

## 2019-10-24 DIAGNOSIS — I2510 Atherosclerotic heart disease of native coronary artery without angina pectoris: Secondary | ICD-10-CM | POA: Diagnosis not present

## 2019-10-24 DIAGNOSIS — K5730 Diverticulosis of large intestine without perforation or abscess without bleeding: Secondary | ICD-10-CM | POA: Diagnosis not present

## 2019-10-24 DIAGNOSIS — G4733 Obstructive sleep apnea (adult) (pediatric): Secondary | ICD-10-CM | POA: Diagnosis not present

## 2019-10-24 DIAGNOSIS — R69 Illness, unspecified: Secondary | ICD-10-CM | POA: Diagnosis not present

## 2019-10-24 DIAGNOSIS — M479 Spondylosis, unspecified: Secondary | ICD-10-CM | POA: Diagnosis not present

## 2019-10-24 DIAGNOSIS — K9409 Other complications of colostomy: Secondary | ICD-10-CM | POA: Diagnosis not present

## 2019-10-24 DIAGNOSIS — I119 Hypertensive heart disease without heart failure: Secondary | ICD-10-CM | POA: Diagnosis not present

## 2019-10-24 NOTE — Patient Outreach (Signed)
[place] [facility]) Care Management  10/24/2019  Roy [name] 1945/09/01 [id number]   Call placed to member to follow up on ongoing recovery from surgery/ostomy.  He report he is doing "better every day."  He has completed his PT sessions, still seeing home health nurse for ostomy teaching, last visit was today.  State he did have some leaking around the seal, ostomy care completed yesterday.  He is hoping a new technique used will decrease leaking.  Denies any urgent concerns, denies any pain or discomfort.  Encouraged to contact this care manager with questions. Will follow up within the next month, after visit with surgeon.   Goals Addressed            This Visit's Progress   . THN - Manage My Diet       Follow Up Date 11/20   - avoid drinking alcohol - avoid foods that make my symptoms worse - drink at least 6 glasses of fluids a day - eat meals at regular times    Why is this important?   Changing what you eat and drink may help you to manage your condition.  It is important to take it slow, making 1 change at a time.  A dietitian is the best person to guide you.    Notes:     . THN - Manage My Emotions       Follow Up Date 11/11   - call and visit an old friend - join a support group    Why is this important?   When you are stressed, down or upset, your body reacts too.  For example, your blood pressure may get higher; you may have a headache or stomachache.  When your emotions get the best of you, your body's ability to fight off cold and flu gets weak.  These steps will help you manage your emotions.     Notes:

## 2019-10-24 NOTE — Telephone Encounter (Signed)
Beth with [facility] called for verbal orders to extend his home health for his ostomy care 2x week for 2 weeks. He is still having issues with it leaking and redness. Call orders to [phone]

## 2019-10-24 NOTE — Telephone Encounter (Signed)
Agree with verbal orders for ostomy care.

## 2019-10-25 DIAGNOSIS — R69 Illness, unspecified: Secondary | ICD-10-CM | POA: Diagnosis not present

## 2019-10-27 DIAGNOSIS — E785 Hyperlipidemia, unspecified: Secondary | ICD-10-CM | POA: Diagnosis not present

## 2019-10-27 DIAGNOSIS — G4733 Obstructive sleep apnea (adult) (pediatric): Secondary | ICD-10-CM | POA: Diagnosis not present

## 2019-10-27 DIAGNOSIS — I700 Atherosclerosis of aorta: Secondary | ICD-10-CM | POA: Diagnosis not present

## 2019-10-27 DIAGNOSIS — I119 Hypertensive heart disease without heart failure: Secondary | ICD-10-CM | POA: Diagnosis not present

## 2019-10-27 DIAGNOSIS — Z933 Colostomy status: Secondary | ICD-10-CM | POA: Diagnosis not present

## 2019-10-27 DIAGNOSIS — I2510 Atherosclerotic heart disease of native coronary artery without angina pectoris: Secondary | ICD-10-CM | POA: Diagnosis not present

## 2019-10-27 DIAGNOSIS — K5730 Diverticulosis of large intestine without perforation or abscess without bleeding: Secondary | ICD-10-CM | POA: Diagnosis not present

## 2019-10-27 DIAGNOSIS — M479 Spondylosis, unspecified: Secondary | ICD-10-CM | POA: Diagnosis not present

## 2019-10-27 DIAGNOSIS — K9409 Other complications of colostomy: Secondary | ICD-10-CM | POA: Diagnosis not present

## 2019-10-27 DIAGNOSIS — Z48815 Encounter for surgical aftercare following surgery on the digestive system: Secondary | ICD-10-CM | POA: Diagnosis not present

## 2019-10-27 DIAGNOSIS — R69 Illness, unspecified: Secondary | ICD-10-CM | POA: Diagnosis not present

## 2019-10-31 DIAGNOSIS — G4733 Obstructive sleep apnea (adult) (pediatric): Secondary | ICD-10-CM | POA: Diagnosis not present

## 2019-10-31 DIAGNOSIS — R69 Illness, unspecified: Secondary | ICD-10-CM | POA: Diagnosis not present

## 2019-10-31 DIAGNOSIS — Z48815 Encounter for surgical aftercare following surgery on the digestive system: Secondary | ICD-10-CM | POA: Diagnosis not present

## 2019-10-31 DIAGNOSIS — I2510 Atherosclerotic heart disease of native coronary artery without angina pectoris: Secondary | ICD-10-CM | POA: Diagnosis not present

## 2019-10-31 DIAGNOSIS — I700 Atherosclerosis of aorta: Secondary | ICD-10-CM | POA: Diagnosis not present

## 2019-10-31 DIAGNOSIS — M479 Spondylosis, unspecified: Secondary | ICD-10-CM | POA: Diagnosis not present

## 2019-10-31 DIAGNOSIS — I119 Hypertensive heart disease without heart failure: Secondary | ICD-10-CM | POA: Diagnosis not present

## 2019-10-31 DIAGNOSIS — K9409 Other complications of colostomy: Secondary | ICD-10-CM | POA: Diagnosis not present

## 2019-10-31 DIAGNOSIS — K5730 Diverticulosis of large intestine without perforation or abscess without bleeding: Secondary | ICD-10-CM | POA: Diagnosis not present

## 2019-10-31 DIAGNOSIS — E785 Hyperlipidemia, unspecified: Secondary | ICD-10-CM | POA: Diagnosis not present

## 2019-11-02 DIAGNOSIS — Z933 Colostomy status: Secondary | ICD-10-CM | POA: Diagnosis not present

## 2019-11-03 DIAGNOSIS — K9409 Other complications of colostomy: Secondary | ICD-10-CM | POA: Diagnosis not present

## 2019-11-03 DIAGNOSIS — Z48815 Encounter for surgical aftercare following surgery on the digestive system: Secondary | ICD-10-CM | POA: Diagnosis not present

## 2019-11-03 DIAGNOSIS — I119 Hypertensive heart disease without heart failure: Secondary | ICD-10-CM | POA: Diagnosis not present

## 2019-11-03 DIAGNOSIS — E785 Hyperlipidemia, unspecified: Secondary | ICD-10-CM | POA: Diagnosis not present

## 2019-11-03 DIAGNOSIS — G4733 Obstructive sleep apnea (adult) (pediatric): Secondary | ICD-10-CM | POA: Diagnosis not present

## 2019-11-03 DIAGNOSIS — R69 Illness, unspecified: Secondary | ICD-10-CM | POA: Diagnosis not present

## 2019-11-03 DIAGNOSIS — M479 Spondylosis, unspecified: Secondary | ICD-10-CM | POA: Diagnosis not present

## 2019-11-03 DIAGNOSIS — K5730 Diverticulosis of large intestine without perforation or abscess without bleeding: Secondary | ICD-10-CM | POA: Diagnosis not present

## 2019-11-03 DIAGNOSIS — I700 Atherosclerosis of aorta: Secondary | ICD-10-CM | POA: Diagnosis not present

## 2019-11-03 DIAGNOSIS — I2510 Atherosclerotic heart disease of native coronary artery without angina pectoris: Secondary | ICD-10-CM | POA: Diagnosis not present

## 2019-11-15 DIAGNOSIS — I951 Orthostatic hypotension: Secondary | ICD-10-CM | POA: Diagnosis not present

## 2019-11-15 DIAGNOSIS — Z8249 Family history of ischemic heart disease and other diseases of the circulatory system: Secondary | ICD-10-CM | POA: Diagnosis not present

## 2019-11-15 DIAGNOSIS — Z85828 Personal history of other malignant neoplasm of skin: Secondary | ICD-10-CM | POA: Diagnosis not present

## 2019-11-15 DIAGNOSIS — R69 Illness, unspecified: Secondary | ICD-10-CM | POA: Diagnosis not present

## 2019-11-15 DIAGNOSIS — Z933 Colostomy status: Secondary | ICD-10-CM | POA: Diagnosis not present

## 2019-11-15 DIAGNOSIS — K219 Gastro-esophageal reflux disease without esophagitis: Secondary | ICD-10-CM | POA: Diagnosis not present

## 2019-11-15 DIAGNOSIS — I25119 Atherosclerotic heart disease of native coronary artery with unspecified angina pectoris: Secondary | ICD-10-CM | POA: Diagnosis not present

## 2019-11-15 DIAGNOSIS — Z7902 Long term (current) use of antithrombotics/antiplatelets: Secondary | ICD-10-CM | POA: Diagnosis not present

## 2019-11-15 DIAGNOSIS — I10 Essential (primary) hypertension: Secondary | ICD-10-CM | POA: Diagnosis not present

## 2019-11-24 ENCOUNTER — Ambulatory Visit: Payer: Medicare HMO | Admitting: *Deleted

## 2019-11-24 ENCOUNTER — Telehealth: Payer: Self-pay | Admitting: *Deleted

## 2019-11-24 NOTE — Telephone Encounter (Signed)
° °  Primary Cardiologist: Jenkins Rouge, MD  Chart reviewed as part of pre-operative protocol coverage.   Left a voicemail for patient to call back for ongoing preop assessment.  Abigail Butts, PA-C 11/24/2019, 3:47 PM

## 2019-11-24 NOTE — Telephone Encounter (Signed)
Patient called back asking for the Pre-op team

## 2019-11-24 NOTE — Telephone Encounter (Signed)
I think its ok to hold his plavix

## 2019-11-24 NOTE — Telephone Encounter (Signed)
   Primary Cardiologist: Jenkins Rouge, MD  Chart reviewed as part of pre-operative protocol coverage. Patient was contacted 11/24/2019 in reference to pre-operative risk assessment for pending surgery as outlined below.  Roy [name] was last seen on 10/12/19 by Robbie Lis, PA-C.  Since that day, Roy [name] has done fine from a cardiac standpoint. He can complete 4 METs without anginal complaints.  Therefore, based on ACC/AHA guidelines, the patient would be at acceptable risk for the planned procedure without further cardiovascular testing.   The patient was advised that if he develops new symptoms prior to surgery to contact our office to arrange for a follow-up visit, and he verbalized understanding.  Per Dr. Johnsie Cancel, patient can hold plavix 5 days prior to his upcoming surgery with plans to restart as soon as he is cleared to do so by his surgeon.  I will route this recommendation to the requesting party via Epic fax function and remove from pre-op pool. Please call with questions.  Abigail Butts, PA-C 11/24/2019, 4:23 PM

## 2019-11-24 NOTE — Telephone Encounter (Signed)
° °  Primary Cardiologist: Jenkins Rouge, MD  Chart reviewed as part of pre-operative protocol coverage.   Patient has a history of CAD s/p remote stenting to RCA in 2001 with subsequent PCI/DES to D2 managed with PCI/DES in 2016. Last ischemic evaluation was a NST in 2018 which was without ischemia, though did reveal scarring c/w prior inferolateral MI. He was last evaluated by cardiology at an outpatient visit with [facility], PA-C 10/12/19 and was without anginal complaints at that time.  Dr. Johnsie Cancel - any objections to holding plavix prior to his upcoming colostomy reversal? Please route your response back to P CV DIV PREOP.      Abigail Butts, PA-C 11/24/2019, 2:39 PM'

## 2019-11-24 NOTE — Telephone Encounter (Signed)
° °  [facility] Pre-operative Risk Assessment    HEARTCARE STAFF: - Please ensure there is not already an duplicate clearance open for this procedure. - Under Visit Info/Reason for Call, type in Other and utilize the format Clearance MM/DD/YY or Clearance TBD. Do not use dashes or single digits. - If request is for dental extraction, please clarify the # of teeth to be extracted.  Request for surgical clearance:  1. What type of surgery is being performed? COLOSTOMY REVERSAL   2. When is this surgery scheduled? TBD   3. What type of clearance is required (medical clearance vs. Pharmacy clearance to hold med vs. Both)? MEDICAL  4. Are there any medications that need to be held prior to surgery and how long? PLAVIX   5. Practice name and name of physician performing surgery? [facility]; DR. Vikki Ports CONNOR  6. What is the office phone number? [phone]   7.   What is the office fax number? [place], CMA  8.   Anesthesia type (None, local, MAC, general) ? GENERAL   Julaine Hua 11/24/2019, 10:27 AM  _________________________________________________________________   (provider comments below)

## 2019-11-28 ENCOUNTER — Other Ambulatory Visit: Payer: Self-pay | Admitting: *Deleted

## 2019-11-28 DIAGNOSIS — Z933 Colostomy status: Secondary | ICD-10-CM | POA: Diagnosis not present

## 2019-11-28 NOTE — Patient Outreach (Addendum)
[place] [facility]) Care Management  11/28/2019  Roy [name] 1945/05/15 [id number]   Outgoing call placed to member to follow up on ostomy management.  He report he is managing much better, leaking and redness has resolved.  Was seen by surgeon last week, plan for ostomy reversal for January.  Did have question about list of medications, received follow up from PCP, denies any further questions.  He is taking medications according to current med list.  He is no longer being seen by home health, comfortable with his care.  Denies any urgent concerns, encouraged to contact this care manager with questions.  Agrees to follow up within the next month.  Goals Addressed            This Visit's Progress   . THN - Manage My Diet   On track    Follow Up Date 11/20   - avoid drinking alcohol - avoid foods that make my symptoms worse - drink at least 6 glasses of fluids a day - eat meals at regular times    Why is this important?   Changing what you eat and drink may help you to manage your condition.  It is important to take it slow, making 1 change at a time.  A dietitian is the best person to guide you.    Notes:     . THN - Manage My Emotions   On track    Follow Up Date 12/15   - call and visit an old friend - join a support group    Why is this important?   When you are stressed, down or upset, your body reacts too.  For example, your blood pressure may get higher; you may have a headache or stomachache.  When your emotions get the best of you, your body's ability to fight off cold and flu gets weak.  These steps will help you manage your emotions.     Notes:   Discussed support for plan for reversal        [name], Therapist, sports, MSN [place] [phone]

## 2019-12-26 ENCOUNTER — Other Ambulatory Visit: Payer: Self-pay | Admitting: *Deleted

## 2019-12-26 NOTE — Patient Outreach (Signed)
[facility]) Care Management  12/26/2019  Roy [name] [birth year] [id number]   Call placed to member, state he is doing very well.  Has not had any further issues with managing his ostomy, no leaks or signs of infection.  He has continued to follow up with surgeon, reversal has been scheduled for February.  State he has received his pre-op instructions, denies any questions.  Agrees to follow up within the next month.  Goals Addressed            This Visit's Progress   . THN - Manage My Diet   On track    Follow Up Date 01/26/2020   - avoid drinking alcohol - avoid foods that make my symptoms worse - drink at least 6 glasses of fluids a day - eat meals at regular times    Why is this important?   Changing what you eat and drink may help you to manage your condition.  It is important to take it slow, making 1 change at a time.  A dietitian is the best person to guide you.    Notes:   12/13 - Discussed ongoing of diet in preparation of ostomy reversal    . THN - Manage My Emotions   On track    Follow Up Date 02/28/2020   - call and visit an old friend - join a support group    Why is this important?   When you are stressed, down or upset, your body reacts too.  For example, your blood pressure may get higher; you may have a headache or stomachache.  When your emotions get the best of you, your body's ability to fight off cold and flu gets weak.  These steps will help you manage your emotions.     Notes:   Discussed support for plan for reversal  12/13 - Date for reversal scheduled

## 2019-12-29 ENCOUNTER — Other Ambulatory Visit: Payer: Self-pay | Admitting: Cardiovascular Disease

## 2019-12-29 ENCOUNTER — Other Ambulatory Visit: Payer: Self-pay | Admitting: Family Medicine

## 2019-12-29 DIAGNOSIS — Z85828 Personal history of other malignant neoplasm of skin: Secondary | ICD-10-CM | POA: Diagnosis not present

## 2019-12-29 DIAGNOSIS — C44712 Basal cell carcinoma of skin of right lower limb, including hip: Secondary | ICD-10-CM | POA: Diagnosis not present

## 2019-12-29 DIAGNOSIS — C44719 Basal cell carcinoma of skin of left lower limb, including hip: Secondary | ICD-10-CM | POA: Diagnosis not present

## 2019-12-29 DIAGNOSIS — L570 Actinic keratosis: Secondary | ICD-10-CM | POA: Diagnosis not present

## 2019-12-29 DIAGNOSIS — L820 Inflamed seborrheic keratosis: Secondary | ICD-10-CM | POA: Diagnosis not present

## 2019-12-29 DIAGNOSIS — L218 Other seborrheic dermatitis: Secondary | ICD-10-CM | POA: Diagnosis not present

## 2020-01-08 DIAGNOSIS — Z933 Colostomy status: Secondary | ICD-10-CM | POA: Diagnosis not present

## 2020-01-19 ENCOUNTER — Ambulatory Visit (INDEPENDENT_AMBULATORY_CARE_PROVIDER_SITE_OTHER): Payer: Medicare HMO | Admitting: Family Medicine

## 2020-01-19 ENCOUNTER — Encounter: Payer: Self-pay | Admitting: Family Medicine

## 2020-01-19 ENCOUNTER — Other Ambulatory Visit: Payer: Self-pay

## 2020-01-19 VITALS — BP 122/84 | HR 90 | Temp 97.1°F | Ht 66.0 in | Wt 188.3 lb

## 2020-01-19 DIAGNOSIS — G35 Multiple sclerosis: Secondary | ICD-10-CM | POA: Diagnosis not present

## 2020-01-19 DIAGNOSIS — R2689 Other abnormalities of gait and mobility: Secondary | ICD-10-CM | POA: Diagnosis not present

## 2020-01-19 DIAGNOSIS — M21371 Foot drop, right foot: Secondary | ICD-10-CM

## 2020-01-19 NOTE — Progress Notes (Signed)
Patient ID: Roy [name], male    DOB: 1945-09-30, 75 y.o.   MRN: [id number]  This visit was conducted in person.  BP 122/84   Pulse 90   Temp (!) 97.1 F (36.2 C) (Temporal)   Ht 5\' 6"  (1.676 m)   Wt 188 lb 5 oz (85.4 kg)   SpO2 95%   BMI 30.39 kg/m    CC: balance issues Subjective:   HPI: Roy [name] is a 75 y.o. 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 He has noted this for the last month.  He tries to walk each day.. feels like he is dragging  his  Right foot off and on.  Feels like legs are weak.   He has been falling more lately going down steps due to poor balance. No numbness, no vision changes. After walking he has pain in hips.  Low back pain after leaf blowing but intermittent and not constant.   With eyes closed  Feels off balance but no vertigo or dizziness.  Had perineal numbness in 2020.. that improved with prednisone course and was felt to be pinched nerve.  No new meds.. stopped plavix  Given easy bruising and slow healing of biopsies on legs.  Dx in 2s with MS... had lumbar puncture. Symptoms resolved with medication injections. Went away until 2-3 years later... treated with bedrest and possibly steroid..  Last time he had any symptoms was 1970 so never treated again.     MRI 11/2018: IMPRESSION: 1. 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 2. Mild-to-moderate left lateral recess stenosis at L2-3. 3. Mild left neural foraminal stenosis at L5-S1.   Relevant past medical, surgical, family and social history reviewed and updated as indicated. Interim medical history since our last visit  reviewed. Allergies and medications reviewed and updated. Outpatient Medications Prior to Visit  Medication Sig Dispense Refill  . acetaminophen (TYLENOL) 325 MG tablet Take 2 tablets (650 mg total) by mouth every 6 (six) hours as needed. 30 tablet 0  . carvedilol (COREG) 12.5 MG tablet TAKE 1 TABLET BY MOUTH TWICE A DAY 180 tablet 3  . feeding supplement, ENSURE ENLIVE, (ENSURE ENLIVE) LIQD Take 237 mLs by mouth 2 (two) times daily between meals. 237 mL 12  . Magnesium Oxide 400 MG CAPS Take 1 capsule (400 mg total) by mouth daily. 30 capsule 0  . nitroGLYCERIN (NITROSTAT) 0.4 MG SL tablet Place 1 tablet (0.4 mg total) under the tongue every 5 (five) minutes x 3 doses as needed for chest pain. 30 tablet 0  . omeprazole (PRILOSEC) 20 MG capsule TAKE 1 CAPSULE BY MOUTH EVERY DAY 90 capsule 3  . venlafaxine XR (EFFEXOR-XR) 37.5 MG 24 hr capsule TAKE IN ADDITION TO 75 MG TABLETS 90 capsule 1  . venlafaxine XR (EFFEXOR-XR) 75 MG 24 hr capsule TAKE 1 CAPSULE (75 MG TOTAL) BY MOUTH DAILY WITH BREAKFAST. 90 capsule 1  . clopidogrel (PLAVIX) 75 MG tablet TAKE 1 TABLET BY MOUTH EVERY DAY (Patient not taking: Reported on 01/19/2020) 90 tablet 2  . dicyclomine (BENTYL) 20 MG tablet Take 1 tablet (20 mg total) by mouth every 6 (six) hours  as needed (abdominal pain). (Patient not taking: Reported on 01/19/2020) 30 tablet 0  . ondansetron (ZOFRAN) 4 MG tablet Take 1 tablet (4 mg total) by mouth every 6 (six) hours as needed for nausea. (Patient not taking: Reported on 01/19/2020) 20 tablet 0  . oxyCODONE (OXY IR/ROXICODONE) 5 MG immediate release tablet Take 1 tablet (5 mg total) by mouth every 6 (six) hours as needed for severe pain or breakthrough pain. (Patient not taking: Reported on 01/19/2020) 15 tablet 0   No facility-administered medications prior to visit.     Per HPI unless specifically indicated in ROS section below Review of Systems  Constitutional: Negative for fatigue and fever.  HENT: Negative for  ear pain.   Eyes: Negative for pain.  Respiratory: Negative for cough and shortness of breath.   Cardiovascular: Negative for chest pain, palpitations and leg swelling.  Gastrointestinal: Negative for abdominal pain.       No pain but has protruding abdomen around stoma at colectomy site, no change ins tool, no erythema   Has scheduled colectomy take down in early February.  Genitourinary: Negative for dysuria.  Musculoskeletal: Negative for arthralgias.  Neurological: Negative for syncope, light-headedness and headaches.  Psychiatric/Behavioral: Negative for dysphoric mood.   Objective:  BP 122/84   Pulse 90   Temp (!) 97.1 F (36.2 C) (Temporal)   Ht 5\' 6"  (1.676 m)   Wt 188 lb 5 oz (85.4 kg)   SpO2 95%   BMI 30.39 kg/m   Wt Readings from Last 3 Encounters:  01/19/20 188 lb 5 oz (85.4 kg)  10/06/19 158 lb (71.7 kg)  09/15/19 145 lb 8 oz (66 kg)      Physical Exam Constitutional:      General: Vital signs are normal.     Appearance: He is well-developed and well-nourished.  HENT:     Head: Normocephalic.     Right Ear: Hearing normal.     Left Ear: Hearing normal.     Nose: Nose normal.     Mouth/Throat:     Mouth: Oropharynx is clear and moist and mucous membranes are normal.  Neck:     Thyroid: No thyroid mass or thyromegaly.     Vascular: No carotid bruit.     Trachea: Trachea normal.  Cardiovascular:     Rate and Rhythm: Normal rate and regular rhythm.     Pulses: Normal pulses.     Heart sounds: Heart sounds not distant. No murmur heard. No friction rub. No gallop.      Comments: No peripheral edema Pulmonary:     Effort: Pulmonary effort is normal. No respiratory distress.     Breath sounds: Normal breath sounds.  Abdominal:       Comments: Protruding abdomen around stoma and colectomy bag.. non tender.. likely hernia  Musculoskeletal:     Cervical back: Normal.     Thoracic back: Normal.     Lumbar back: Normal.     Right hip: No tenderness or bony  tenderness. Decreased range of motion.     Left hip: No tenderness or bony tenderness. Decreased range of motion.     Comments: Only minimal decrease in BIlateral hip ROM  Skin:    General: Skin is warm, dry and intact.     Findings: No rash.  Neurological:     Mental Status: He is alert and oriented to person, place, and time.     Cranial Nerves: Cranial nerves are intact.     Sensory: Sensation  is intact.     Motor: No weakness, tremor, atrophy, abnormal muscle tone or pronator drift.     Coordination: Coordination is intact. Romberg sign negative. Finger-Nose-Finger Test normal.     Gait: Gait abnormal.     Comments:  When walking slightly shuffling gait and occ catching right toes from right foot dragging  Psychiatric:        Mood and Affect: Mood and affect normal.        Speech: Speech normal.        Behavior: Behavior normal.        Thought Content: Thought content normal.       Results for orders placed or performed in visit on 09/15/19  Comprehensive metabolic panel  Result Value Ref Range   Sodium 135 135 - 145 mEq/L   Potassium 4.5 3.5 - 5.1 mEq/L   Chloride 101 96 - 112 mEq/L   CO2 25 19 - 32 mEq/L   Glucose, Bld 98 70 - 99 mg/dL   BUN 27 (H) 6 - 23 mg/dL   Creatinine, Ser 3.41 0.40 - 1.50 mg/dL   Total Bilirubin 0.4 0.2 - 1.2 mg/dL   Alkaline Phosphatase 80 39 - 117 U/L   AST 20 0 - 37 U/L   ALT 29 0 - 53 U/L   Total Protein 7.1 6.0 - 8.3 g/dL   Albumin 4.0 3.5 - 5.2 g/dL   GFR 93.79 >02.40 mL/min   Calcium 9.8 8.4 - 10.5 mg/dL  Magnesium  Result Value Ref Range   Magnesium 1.3 (L) 1.5 - 2.5 mg/dL    This visit occurred during the SARS-CoV-2 public health emergency.  Safety protocols were in place, including screening questions prior to the visit, additional usage of staff PPE, and extensive cleaning of exam room while observing appropriate contact time as indicated for disinfecting solutions.   COVID 19 screen:  No recent travel or known exposure to  COVID19 The patient denies respiratory symptoms of COVID 19 at this time. The importance of social distancing was discussed today.   Assessment and Plan 75 year old male with new symptoms of balnce issues, gait instability and right foot drop.   Not clearly related to lumbar spine issues... MRI in 2020 showed mild changes.  More concerning for  MS flare. ? If perineal numbness in 2020 was also flare that improved with steroids. Will refer fairly urgently to be seen by neuro in next 2 weeks.   Encouraged pt to use cane and take fall precautions. Restart home PT.    Kerby Nora, MD

## 2020-01-19 NOTE — Patient Instructions (Addendum)
Call cardiology to ask if okay to stop plavix but restart until you hear from them.  Work on adding leg strengthening exercises 2 times daily in addition walking .  We will call with neuro referral.  Review your house for fall prevention. Use a cane or even 4 prong cane.

## 2020-01-20 DIAGNOSIS — H524 Presbyopia: Secondary | ICD-10-CM | POA: Diagnosis not present

## 2020-01-23 ENCOUNTER — Other Ambulatory Visit: Payer: Self-pay | Admitting: *Deleted

## 2020-01-23 ENCOUNTER — Encounter: Payer: Self-pay | Admitting: Neurology

## 2020-01-23 NOTE — Patient Outreach (Signed)
[facility]) Care Management  [place]  01/23/2020   Roy [name] November 06, 1945 [id number]   Call placed to member, no answer, HIPAA compliant voice message left.  Will follow up within the next 3-4 business days.  [name], [place], MSN [place] [phone]

## 2020-01-26 ENCOUNTER — Other Ambulatory Visit: Payer: Self-pay | Admitting: *Deleted

## 2020-01-26 DIAGNOSIS — Z85828 Personal history of other malignant neoplasm of skin: Secondary | ICD-10-CM | POA: Diagnosis not present

## 2020-01-26 DIAGNOSIS — L218 Other seborrheic dermatitis: Secondary | ICD-10-CM | POA: Diagnosis not present

## 2020-01-26 NOTE — Patient Outreach (Signed)
[facility]) Care Management  [place]  01/26/2020   Roy [name] 08/05/1945 [id number]   Outreach attempt #2, successful.  Report he is doing well, will have colostomy reversal 2/23, denies any questions about surgery.  He does report some issues with his balance, denies any falls.  Does not feel he need to use a cane yet, but will purchase one just in case.  Has history of MS, unsure if this is another flare up or something different.  Was seen by PCP on 1/6 for this concern, will follow up with neurology on 1/27.  Denies any urgent concerns, encouraged to contact this care manager with questions.    Encounter Medications:  Outpatient Encounter Medications as of 01/26/2020  Medication Sig  . acetaminophen (TYLENOL) 325 MG tablet Take 2 tablets (650 mg total) by mouth every 6 (six) hours as needed.  . carvedilol (COREG) 12.5 MG tablet TAKE 1 TABLET BY MOUTH TWICE A DAY  . clopidogrel (PLAVIX) 75 MG tablet TAKE 1 TABLET BY MOUTH EVERY DAY (Patient not taking: Reported on 01/19/2020)  . dicyclomine (BENTYL) 20 MG tablet Take 1 tablet (20 mg total) by mouth every 6 (six) hours as needed (abdominal pain). (Patient not taking: Reported on 01/19/2020)  . feeding supplement, ENSURE ENLIVE, (ENSURE ENLIVE) LIQD Take 237 mLs by mouth 2 (two) times daily between meals.  . Magnesium Oxide 400 MG CAPS Take 1 capsule (400 mg total) by mouth daily.  . nitroGLYCERIN (NITROSTAT) 0.4 MG SL tablet Place 1 tablet (0.4 mg total) under the tongue every 5 (five) minutes x 3 doses as needed for chest pain.  Marland Kitchen omeprazole (PRILOSEC) 20 MG capsule TAKE 1 CAPSULE BY MOUTH EVERY DAY  . ondansetron (ZOFRAN) 4 MG tablet Take 1 tablet (4 mg total) by mouth every 6 (six) hours as needed for nausea. (Patient not taking: Reported on 01/19/2020)  . oxyCODONE (OXY IR/ROXICODONE) 5 MG immediate release tablet Take 1 tablet (5 mg total) by mouth every 6 (six) hours as needed for severe pain or  breakthrough pain. (Patient not taking: Reported on 01/19/2020)  . venlafaxine XR (EFFEXOR-XR) 37.5 MG 24 hr capsule TAKE IN ADDITION TO 75 MG TABLETS  . venlafaxine XR (EFFEXOR-XR) 75 MG 24 hr capsule TAKE 1 CAPSULE (75 MG TOTAL) BY MOUTH DAILY WITH BREAKFAST.   No facility-administered encounter medications on file as of 01/26/2020.    Functional Status:  In your present state of health, do you have any difficulty performing the following activities: 08/27/2019 08/11/2019  Hearing? Tempie Donning  Vision? Y Y  Difficulty concentrating or making decisions? N N  Walking or climbing stairs? N N  Dressing or bathing? N N  Doing errands, shopping? N N  Some recent data might be hidden    Fall/Depression Screening: Fall Risk  11/16/2018 11/06/2017 07/08/2016  Falls in the past year? 0 No No   PHQ 2/9 Scores 09/16/2019 11/16/2018 11/06/2017 07/08/2016 06/01/2015  PHQ - 2 Score 1 0 0 0 0  PHQ- 9 Score - - 0 - -  Exception Documentation (No Data) - - - -    Assessment:  Goals Addressed            This Visit's Progress   . THN - Manage My Diet   On track    Follow Up Date 03/09/2020   - avoid drinking alcohol - avoid foods that make my symptoms worse - drink at least 6 glasses of fluids a day - eat meals  at regular times    Why is this important?   Changing what you eat and drink may help you to manage your condition.  It is important to take it slow, making 1 change at a time.  A dietitian is the best person to guide you.    Notes:   12/13 - Discussed ongoing of diet in preparation of ostomy reversal    . THN - Manage My Emotions   On track    Follow Up Date 02/28/2020   - call and visit an old friend - join a support group    Why is this important?   When you are stressed, down or upset, your body reacts too.  For example, your blood pressure may get higher; you may have a headache or stomachache.  When your emotions get the best of you, your body's ability to fight off cold and flu  gets weak.  These steps will help you manage your emotions.     Notes:   Discussed support for plan for reversal  12/13 - Date for reversal scheduled       Plan:  Follow-up:  Patient agrees to Care Plan and Follow-up.  Will follow up with member within the next month, after surgery.  If continue to deny needs, will close case at that time.  [name], [place], MSN [place] [phone]

## 2020-01-31 DIAGNOSIS — Z933 Colostomy status: Secondary | ICD-10-CM | POA: Diagnosis not present

## 2020-02-08 ENCOUNTER — Ambulatory Visit: Payer: Self-pay | Admitting: Surgery

## 2020-02-08 NOTE — H&P (View-Only) (Signed)
Surgical Evaluation  Chief Complaint: ostomy  HPI: Patient underwent an urgent Hartman's procedure for obstructing diverticular stricture in August 2021.  Presents for elective reversal.  No Known Allergies  Past Medical History:  Diagnosis Date   Basal cell carcinoma    Bilateral Legs   CAD (coronary artery disease)    a.  stent to the RCA in 2001;  b.  Negative Myoview in January 2012;  c.  [place] 4/16:  dLM 20, small D1 50-70, D2 sub-total, oLCx 50-70, RCA stent ok, dPLB 90, EF 35-40% >> DES to D2   Depression    History of echocardiogram    a.  Echo 4/16: Mild LVH, EF 11-91%, grade 1 diastolic dysfunction, normal wall motion, MAC   HLD (hyperlipidemia)    HTN (hypertension)    OSA (obstructive sleep apnea)    Vertigo     Past Surgical History:  Procedure Laterality Date   BIOPSY  07/26/2019   Procedure: BIOPSY;  Surgeon: Gatha Mayer, MD;  Location: [facility];  Service: Endoscopy;;   CATARACT EXTRACTION, BILATERAL     COLON RESECTION SIGMOID N/A 08/30/2019   Procedure: SIGMOID COLON RESECTION;  Surgeon: Clovis Riley, MD;  Location: [facility];  Service: General;  Laterality: N/A;   COLONOSCOPY N/A 07/26/2019   Procedure: COLONOSCOPY;  Surgeon: Gatha Mayer, MD;  Location[facility] ENDOSCOPY;  Service: Endoscopy;  Laterality: N/A;   COLONOSCOPY     COLOSTOMY N/A 08/30/2019   Procedure: COLOSTOMY;  Surgeon: Clovis Riley, MD;  Location: [facility];  Service: General;  Laterality: N/A;   CORONARY STENT PLACEMENT  2001    RCA   LEFT HEART CATHETERIZATION WITH CORONARY ANGIOGRAM N/A 02/13/2012   Procedure: LEFT HEART CATHETERIZATION WITH CORONARY ANGIOGRAM;  Surgeon: [name], MD;  Location: [facility] CATH LAB;  Service: Cardiovascular;  Laterality: N/A;   LEFT HEART CATHETERIZATION WITH CORONARY ANGIOGRAM N/A 04/24/2014   Procedure: LEFT HEART CATHETERIZATION WITH CORONARY ANGIOGRAM;  Surgeon: [name], MD;  Location: [facility] CATH LAB;  Service: Cardiovascular;  Laterality: N/A;    TONSILLECTOMY      Family History  Problem Relation Age of Onset   Cirrhosis Mother    Alcohol abuse Mother    Pancreatic cancer Mother    Heart disease Father    Hypertension Father    Peripheral vascular disease Father    Coronary artery disease Father    Depression Sister    Heart attack Neg Hx    Stroke Neg Hx    Stomach cancer Neg Hx    Colon cancer Neg Hx    Esophageal cancer Neg Hx     Social History   Socioeconomic History   Marital status: Divorced    Spouse name: Not on file   Number of children: Not on file   Years of education: Not on file   Highest education level: Not on file  Occupational History   Not on file  Tobacco Use   Smoking status: Former Smoker    Packs/day: 1.00    Years: 25.00    Pack years: 25.00    Types: Cigarettes    Quit date: 01/14/2000    Years since quitting: 20.0   Smokeless tobacco: Never Used  Vaping Use   Vaping Use: Never used  Substance and Sexual Activity   Alcohol use: Yes    Alcohol/week: 3.0 standard drinks    Types: 1 Cans of beer, 2 Shots of liquor per week   Drug use: No  Sexual activity: Not Currently  Other Topics Concern   Not on file  Social History Narrative   2 kids, 4 grandkids, separated   [place].   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   Social Determinants of Health   Financial Resource Strain: Not on file  Food Insecurity: No Food Insecurity   Worried About Charity fundraiser in the Last Year: Never true   Ran Out of Food in the Last Year: Never true  Transportation Needs: No Transportation Needs   Lack of Transportation (Medical): No   Lack of Transportation (Non-Medical): No  Physical Activity: Not on file  Stress: Not on file  Social Connections: Not on file    Current Outpatient Medications on File Prior to Visit  Medication Sig Dispense Refill   acetaminophen (TYLENOL) 325 MG tablet Take 2 tablets (650 mg total) by mouth every 6 (six) hours as needed. 30 tablet 0    carvedilol (COREG) 12.5 MG tablet TAKE 1 TABLET BY MOUTH TWICE A DAY 180 tablet 3   clopidogrel (PLAVIX) 75 MG tablet TAKE 1 TABLET BY MOUTH EVERY DAY (Patient not taking: Reported on 01/19/2020) 90 tablet 2   dicyclomine (BENTYL) 20 MG tablet Take 1 tablet (20 mg total) by mouth every 6 (six) hours as needed (abdominal pain). (Patient not taking: Reported on 01/19/2020) 30 tablet 0   feeding supplement, ENSURE ENLIVE, (ENSURE ENLIVE) LIQD Take 237 mLs by mouth 2 (two) times daily between meals. 237 mL 12   Magnesium Oxide 400 MG CAPS Take 1 capsule (400 mg total) by mouth daily. 30 capsule 0   nitroGLYCERIN (NITROSTAT) 0.4 MG SL tablet Place 1 tablet (0.4 mg total) under the tongue every 5 (five) minutes x 3 doses as needed for chest pain. 30 tablet 0   omeprazole (PRILOSEC) 20 MG capsule TAKE 1 CAPSULE BY MOUTH EVERY DAY 90 capsule 3   ondansetron (ZOFRAN) 4 MG tablet Take 1 tablet (4 mg total) by mouth every 6 (six) hours as needed for nausea. (Patient not taking: Reported on 01/19/2020) 20 tablet 0   oxyCODONE (OXY IR/ROXICODONE) 5 MG immediate release tablet Take 1 tablet (5 mg total) by mouth every 6 (six) hours as needed for severe pain or breakthrough pain. (Patient not taking: Reported on 01/19/2020) 15 tablet 0   venlafaxine XR (EFFEXOR-XR) 37.5 MG 24 hr capsule TAKE IN ADDITION TO 75 MG TABLETS 90 capsule 1   venlafaxine XR (EFFEXOR-XR) 75 MG 24 hr capsule TAKE 1 CAPSULE (75 MG TOTAL) BY MOUTH DAILY WITH BREAKFAST. 90 capsule 1   No current facility-administered medications on file prior to visit.    Review of Systems: a complete, 10pt review of systems was completed with pertinent positives and negatives as documented in the HPI  Physical Exam: A&Ox3, no distress     CBC Latest Ref Rng & Units 09/06/2019 09/04/2019 09/02/2019  WBC 4.0 - 10.5 K/uL 5.8 5.9 6.6  Hemoglobin 13.0 - 17.0 g/dL 9.2(L) 10.0(L) 9.9(L)  Hematocrit 39.0 - 52.0 % 27.0(L) 29.6(L) 29.1(L)  Platelets 150 - 400 K/uL 253  252 237    CMP Latest Ref Rng & Units 09/15/2019 09/06/2019 09/05/2019  Glucose 70 - 99 mg/dL 98 100(H) 97  BUN 6 - 23 mg/dL 27(H) 9 6(L)  Creatinine 0.40 - 1.50 mg/dL 0.94 0.80 0.70  Sodium 135 - 145 mEq/L 135 141 143  Potassium 3.5 - 5.1 mEq/L 4.5 3.6 3.1(L)  Chloride 96 - 112 mEq/L 101 107 110  CO2 19 - 32 mEq/L _0 Calcium 8.4 - 10.5 mg/dL 9.8 8.6(L) 8.1(L)  Total Protein 6.0 - 8.3 g/dL 7.1 - -  Total Bilirubin 0.2 - 1.2 mg/dL 0.4 - -  Alkaline Phos 39 - 117 U/L 80 - -  AST 0 - 37 U/L 20 - -  ALT 0 - 53 U/L 29 - -    Lab Results  Component Value Date   INR 1.2 08/10/2019   INR 1.1 07/24/2019   INR 1.0 02/10/2012    Imaging: No results found.   A/P: We will plan to proceed with laparoscopic versus open reversal of end colostomy with colorectal anastomosis.  Discussed the procedure in detail with the patient and he understands the risks of bleeding, infection, pain, scarring, injury to intra-abdominal structures, anastomotic leak or intra-abdominal abscess, possible need for diverting loop ileostomy, wound healing problems, hernia, ileus or bowel obstruction, as well as cardiovascular/pulmonary/thromboembolic complications.    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 08/23/2019   Lactic acid acidosis 08/10/2019   Low blood potassium 08/10/2019   Colitis, acute 07/29/2019   Abnormal CT scan, colon    Protein-calorie malnutrition, severe 07/25/2019   Large bowel obstruction ([place]) 07/23/2019   Acute renal failure ([place]) 07/23/2019   Controlled type 2 diabetes mellitus with hyperglycemia ([place]) 07/23/2019   Insomnia 07/23/2019   Hypotension 07/01/2019   Personal history of multiple sclerosis ([place]) 12/02/2018   Weakness 03/31/2017   Acute constipation 05/25/2015   Internal hemorrhoids with  complication 84/[id number]   CAD, RCA PCI 2001, urgent Dx2 DES 04/24/14 04/24/2014   Unstable angina with ST elelvation and NSVT while on treadmill 04/24/14    History of basal cell carcinoma 03/07/2014   GERD (gastroesophageal reflux disease) 03/07/2014   Incomplete emptying of bladder 03/07/2014   Obstructive sleep apnea 08/20/2009   Dyslipidemia 11/09/2008   Depression, major, recurrent ([place]) 07/06/2007   Essential hypertension 07/06/2007   Coronary artery disease involving native coronary artery of native heart without angina pectoris 07/06/2007       Romana Juniper, [facility]  See AMION to contact appropriate on-call provider

## 2020-02-08 NOTE — H&P (Signed)
Surgical Evaluation  Chief Complaint: ostomy  HPI: Patient underwent an urgent Hartman's procedure for obstructing diverticular stricture in August 2021.  Presents for elective reversal.  No Known Allergies  Past Medical History:  Diagnosis Date   Basal cell carcinoma    Bilateral Legs   CAD (coronary artery disease)    a.  stent to the RCA in 2001;  b.  Negative Myoview in January 2012;  c.  [place] 4/16:  dLM 20, small D1 50-70, D2 sub-total, oLCx 50-70, RCA stent ok, dPLB 90, EF 35-40% >> DES to D2   Depression    History of echocardiogram    a.  Echo 4/16: Mild LVH, EF 53-29%, grade 1 diastolic dysfunction, normal wall motion, MAC   HLD (hyperlipidemia)    HTN (hypertension)    OSA (obstructive sleep apnea)    Vertigo     Past Surgical History:  Procedure Laterality Date   BIOPSY  07/26/2019   Procedure: BIOPSY;  Surgeon: Gatha Mayer, MD;  Location: [place];  Service: Endoscopy;;   CATARACT EXTRACTION, BILATERAL     COLON RESECTION SIGMOID N/A 08/30/2019   Procedure: SIGMOID COLON RESECTION;  Surgeon: Clovis Riley, MD;  Location: [place];  Service: General;  Laterality: N/A;   COLONOSCOPY N/A 07/26/2019   Procedure: COLONOSCOPY;  Surgeon: Gatha Mayer, MD;  Location: [facility] ENDOSCOPY;  Service: Endoscopy;  Laterality: N/A;   COLONOSCOPY     COLOSTOMY N/A 08/30/2019   Procedure: COLOSTOMY;  Surgeon: Clovis Riley, MD;  Location: [place];  Service: General;  Laterality: N/A;   CORONARY STENT PLACEMENT  2001    RCA   LEFT HEART CATHETERIZATION WITH CORONARY ANGIOGRAM N/A 02/13/2012   Procedure: LEFT HEART CATHETERIZATION WITH CORONARY ANGIOGRAM;  Surgeon: [name], MD;  Location: [facility] CATH LAB;  Service: Cardiovascular;  Laterality: N/A;   LEFT HEART CATHETERIZATION WITH CORONARY ANGIOGRAM N/A 04/24/2014   Procedure: LEFT HEART CATHETERIZATION WITH CORONARY ANGIOGRAM;  Surgeon: [name], MD;  Location: [facility] CATH LAB;  Service: Cardiovascular;  Laterality: N/A;    TONSILLECTOMY      Family History  Problem Relation Age of Onset   Cirrhosis Mother    Alcohol abuse Mother    Pancreatic cancer Mother    Heart disease Father    Hypertension Father    Peripheral vascular disease Father    Coronary artery disease Father    Depression Sister    Heart attack Neg Hx    Stroke Neg Hx    Stomach cancer Neg Hx    Colon cancer Neg Hx    Esophageal cancer Neg Hx     Social History   Socioeconomic History   Marital status: Divorced    Spouse name: Not on file   Number of children: Not on file   Years of education: Not on file   Highest education level: Not on file  Occupational History   Not on file  Tobacco Use   Smoking status: Former Smoker    Packs/day: 1.00    Years: 25.00    Pack years: 25.00    Types: Cigarettes    Quit date: 01/14/2000    Years since quitting: 20.0   Smokeless tobacco: Never Used  Vaping Use   Vaping Use: Never used  Substance and Sexual Activity   Alcohol use: Yes    Alcohol/week: 3.0 standard drinks    Types: 1 Cans of beer, 2 Shots of liquor per week   Drug use: No  Sexual activity: Not Currently  °Other Topics Concern  ° Not on file  °Social History Narrative  ° 2 kids, 4 grandkids, separated  ° 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  ° °Social Determinants of Health  ° °Financial Resource Strain: Not on file  °Food Insecurity: No Food Insecurity  ° Worried About Running Out of Food in the Last Year: Never true  ° Ran Out of Food in the Last Year: Never true  °Transportation Needs: No Transportation Needs  ° Lack of Transportation (Medical): No  ° Lack of Transportation (Non-Medical): No  °Physical Activity: Not on file  °Stress: Not on file  °Social Connections: Not on file  ° ° °Current Outpatient Medications on File Prior to Visit  °Medication Sig Dispense Refill  ° acetaminophen (TYLENOL) 325 MG tablet Take 2 tablets (650 mg total) by mouth every 6 (six) hours as needed. 30 tablet 0   ° carvedilol (COREG) 12.5 MG tablet TAKE 1 TABLET BY MOUTH TWICE A DAY 180 tablet 3  ° clopidogrel (PLAVIX) 75 MG tablet TAKE 1 TABLET BY MOUTH EVERY DAY (Patient not taking: Reported on 01/19/2020) 90 tablet 2  ° dicyclomine (BENTYL) 20 MG tablet Take 1 tablet (20 mg total) by mouth every 6 (six) hours as needed (abdominal pain). (Patient not taking: Reported on 01/19/2020) 30 tablet 0  ° feeding supplement, ENSURE ENLIVE, (ENSURE ENLIVE) LIQD Take 237 mLs by mouth 2 (two) times daily between meals. 237 mL 12  ° Magnesium Oxide 400 MG CAPS Take 1 capsule (400 mg total) by mouth daily. 30 capsule 0  ° nitroGLYCERIN (NITROSTAT) 0.4 MG SL tablet Place 1 tablet (0.4 mg total) under the tongue every 5 (five) minutes x 3 doses as needed for chest pain. 30 tablet 0  ° omeprazole (PRILOSEC) 20 MG capsule TAKE 1 CAPSULE BY MOUTH EVERY DAY 90 capsule 3  ° ondansetron (ZOFRAN) 4 MG tablet Take 1 tablet (4 mg total) by mouth every 6 (six) hours as needed for nausea. (Patient not taking: Reported on 01/19/2020) 20 tablet 0  ° oxyCODONE (OXY IR/ROXICODONE) 5 MG immediate release tablet Take 1 tablet (5 mg total) by mouth every 6 (six) hours as needed for severe pain or breakthrough pain. (Patient not taking: Reported on 01/19/2020) 15 tablet 0  ° venlafaxine XR (EFFEXOR-XR) 37.5 MG 24 hr capsule TAKE IN ADDITION TO 75 MG TABLETS 90 capsule 1  ° venlafaxine XR (EFFEXOR-XR) 75 MG 24 hr capsule TAKE 1 CAPSULE (75 MG TOTAL) BY MOUTH DAILY WITH BREAKFAST. 90 capsule 1  ° °No current facility-administered medications on file prior to visit.  ° ° °Review of Systems: a complete, 10pt review of systems was completed with pertinent positives and negatives as documented in the HPI ° °Physical Exam: °A&Ox3, no distress  ° ° ° °CBC Latest Ref Rng & Units 09/06/2019 09/04/2019 09/02/2019  °WBC 4.0 - 10.5 K/uL 5.8 5.9 6.6  °Hemoglobin 13.0 - 17.0 g/dL 9.2(L) 10.0(L) 9.9(L)  °Hematocrit 39.0 - 52.0 % 27.0(L) 29.6(L) 29.1(L)  °Platelets 150 - 400 K/uL 253  252 237  ° ° °CMP Latest Ref Rng & Units 09/15/2019 09/06/2019 09/05/2019  °Glucose 70 - 99 mg/dL 98 100(H) 97  °BUN 6 - 23 mg/dL 27(H) 9 6(L)  °Creatinine 0.40 - 1.50 mg/dL 0.94 0.80 0.70  °Sodium 135 - 145 mEq/L 135 141 143  °Potassium 3.5 - 5.1 mEq/L 4.5 3.6 3.1(L)  °Chloride 96 - 112 mEq/L 101 107 110  °  CO2 19 - 32 mEq/L _0 Calcium 8.4 - 10.5 mg/dL 9.8 8.6(L) 8.1(L)  Total Protein 6.0 - 8.3 g/dL 7.1 - -  Total Bilirubin 0.2 - 1.2 mg/dL 0.4 - -  Alkaline Phos 39 - 117 U/L 80 - -  AST 0 - 37 U/L 20 - -  ALT 0 - 53 U/L 29 - -    Lab Results  Component Value Date   INR 1.2 08/10/2019   INR 1.1 07/24/2019   INR 1.0 02/10/2012    Imaging: No results found.   A/P: We will plan to proceed with laparoscopic versus open reversal of end colostomy with colorectal anastomosis.  Discussed the procedure in detail with the patient and he understands the risks of bleeding, infection, pain, scarring, injury to intra-abdominal structures, anastomotic leak or intra-abdominal abscess, possible need for diverting loop ileostomy, wound healing problems, hernia, ileus or bowel obstruction, as well as cardiovascular/pulmonary/thromboembolic complications.    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 08/23/2019   Lactic acid acidosis 08/10/2019   Low blood potassium 08/10/2019   Colitis, acute 07/29/2019   Abnormal CT scan, colon    Protein-calorie malnutrition, severe 07/25/2019   Large bowel obstruction ([place]) 07/23/2019   Acute renal failure ([place]) 07/23/2019   Controlled type 2 diabetes mellitus with hyperglycemia ([place]) 07/23/2019   Insomnia 07/23/2019   Hypotension 07/01/2019   Personal history of multiple sclerosis ([place]) 12/02/2018   Weakness 03/31/2017   Acute constipation 05/25/2015   Internal hemorrhoids with  complication 09/47/0962   CAD, RCA PCI 2001, urgent Dx2 DES 04/24/14 04/24/2014   Unstable angina with ST elelvation and NSVT while on treadmill 04/24/14    History of basal cell carcinoma 03/07/2014   GERD (gastroesophageal reflux disease) 03/07/2014   Incomplete emptying of bladder 03/07/2014   Obstructive sleep apnea 08/20/2009   Dyslipidemia 11/09/2008   Depression, major, recurrent ([place]) 07/06/2007   Essential hypertension 07/06/2007   Coronary artery disease involving native coronary artery of native heart without angina pectoris 07/06/2007       Romana Juniper, [facility]  See AMION to contact appropriate on-call provider

## 2020-02-09 ENCOUNTER — Other Ambulatory Visit: Payer: Self-pay

## 2020-02-09 ENCOUNTER — Ambulatory Visit: Payer: Medicare HMO | Admitting: Neurology

## 2020-02-09 ENCOUNTER — Encounter: Payer: Self-pay | Admitting: Neurology

## 2020-02-09 ENCOUNTER — Other Ambulatory Visit (INDEPENDENT_AMBULATORY_CARE_PROVIDER_SITE_OTHER): Payer: Medicare HMO

## 2020-02-09 VITALS — BP 145/93 | HR 91 | Resp 18 | Ht 66.0 in | Wt 188.0 lb

## 2020-02-09 DIAGNOSIS — R202 Paresthesia of skin: Secondary | ICD-10-CM

## 2020-02-09 DIAGNOSIS — G35 Multiple sclerosis: Secondary | ICD-10-CM

## 2020-02-09 DIAGNOSIS — R292 Abnormal reflex: Secondary | ICD-10-CM

## 2020-02-09 DIAGNOSIS — R2681 Unsteadiness on feet: Secondary | ICD-10-CM | POA: Diagnosis not present

## 2020-02-09 LAB — B12 AND FOLATE PANEL
Folate: 11.8 ng/mL (ref >5.9)
Vitamin B-12: 327 pg/mL (ref 211–911)

## 2020-02-09 NOTE — Patient Instructions (Addendum)
MRI brain wwo contrast.  We will schedule this for March.   Nerve testing of the legs.  Do not apply lotion or oil to your legs and feet on the day of testing.  Check labs  Start balance exercise at home  Return to clinic in 3 months  We have sent a referral to [facility] for your MRI and they will call you directly to schedule your appointment. They are located at [place]. If you need to contact them directly please call [phone].  Your provider has requested that you have labwork completed today. Please go to [facility] ([id number]) on the second floor of this building before leaving the office today. You do not need to check in. If you are not called within 15 minutes please check with the front desk.

## 2020-02-09 NOTE — Progress Notes (Signed)
[facility] Clinic Note - Initial Visit   Date: 02/09/20  Roy [name] MRN: [id number] DOB: 1945-12-25   Dear Dr. Diona Browner:  Thank you for your kind referral of Roy [name] for consultation of imbalance. Although his history is well known to you, please allow [place] to reiterate it for the purpose of our medical record. The patient was accompanied to the clinic by self.   History of Present Illness: Roy [name] is a 75 y.o. right-handed male with hypertension, hyperlipidemia, CAD, depression, and diverticulitis s/p colon resection presenting for evaluation of unsteady gait.  Over the past few months, he has noticed that his legs tend to be slower when trying to walk, such that they may drag every so lightly when turning.  He endorses imbalance.  He has not suffered any falls, and walks unassisted.  He denies weakness, numbness, or tingling of the legs. He was diagnosed with multiple sclerosis in the 1960s and treated with Acthar twice.  He had some numbness/tingling and vision changes, last in 1970s.  Since this time, he has not had any further neurological spells and not been on treatment.    He lives alone in a one-level home.   Out-side paper records, electronic medical record, and images have been reviewed where available and summarized as:  MRI lumbar spine wo contrast 12/10/2018: 1. 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 2. Mild-to-moderate left lateral recess stenosis at L2-3. 3. Mild left neural foraminal stenosis at L5-S1.  Lab Results  Component Value Date   HGBA1C 6.2 (H) 07/23/2019   Lab Results  Component Value Date   XAJOINOM76 720 12/02/2018   Lab Results  Component Value Date   TSH 2.69 12/02/2018     Past Medical History:  Diagnosis Date  . Basal cell carcinoma    Bilateral Legs  .  CAD (coronary artery disease)    a.  stent to the RCA in 2001;  b.  Negative Myoview in January 2012;  c.  [place] 4/16:  dLM 20, small D1 50-70, D2 sub-total, oLCx 50-70, RCA stent ok, dPLB 90, EF 35-40% >> DES to D2  . Depression   . History of echocardiogram    a.  E[place] 4/16: Mild LVH, EF 94-70%, grade 1 diastolic dysfunction, normal wall motion, MAC  . HLD (hyperlipidemia)   . HTN (hypertension)   . OSA (obstructive sleep apnea)   . Vertigo     Past Surgical History:  Procedure Laterality Date  . BIOPSY  07/26/2019   Procedure: BIOPSY;  Surgeon: Gatha Mayer, MD;  Location: [place];  Service: Endoscopy;;  . CATARACT EXTRACTION, BILATERAL    . COLON RESECTION SIGMOID N/A 08/30/2019   Procedure: SIGMOID COLON RESECTION;  Surgeon: Clovis Riley, MD;  Location: [place];  Service: General;  Laterality: N/A;  . COLONOSCOPY N/A 07/26/2019   Procedure: COLONOSCOPY;  Surgeon: Gatha Mayer, MD;  Location: [facility] ENDOSCOPY;  Service: Endoscopy;  Laterality: N/A;  . COLONOSCOPY    . COLOSTOMY N/A 08/30/2019   Procedure: COLOSTOMY;  Surgeon: Clovis Riley, MD;  Location: [place];  Service: General;  Laterality: N/A;  . CORONARY STENT PLACEMENT  2001    RCA  . LEFT HEART CATHETERIZATION WITH CORONARY ANGIOGRAM N/A 02/13/2012   Procedure: LEFT HEART CATHETERIZATION WITH CORONARY ANGIOGRAM;  Surgeon: [name], MD;  Location: [facility] CATH LAB;  Service: Cardiovascular;  Laterality: N/A;  . LEFT HEART CATHETERIZATION WITH CORONARY ANGIOGRAM N/A 04/24/2014   Procedure: LEFT HEART CATHETERIZATION WITH CORONARY ANGIOGRAM;  Surgeon: [name], MD;  Location: [facility] CATH LAB;  Service: Cardiovascular;  Laterality: N/A;  . TONSILLECTOMY       Medications:  Outpatient Encounter Medications as of 02/09/2020  Medication Sig  . acetaminophen (TYLENOL) 325 MG tablet Take 2 tablets (650 mg total) by mouth every 6 (six) hours as needed.  . carvedilol (COREG) 12.5 MG tablet TAKE 1 TABLET BY MOUTH TWICE  A DAY  . clopidogrel (PLAVIX) 75 MG tablet TAKE 1 TABLET BY MOUTH EVERY DAY  . dicyclomine (BENTYL) 20 MG tablet Take 1 tablet (20 mg total) by mouth every 6 (six) hours as needed (abdominal pain).  . feeding supplement, ENSURE ENLIVE, (ENSURE ENLIVE) LIQD Take 237 mLs by mouth 2 (two) times daily between meals.  . Magnesium Oxide 400 MG CAPS Take 1 capsule (400 mg total) by mouth daily.  . nitroGLYCERIN (NITROSTAT) 0.4 MG SL tablet Place 1 tablet (0.4 mg total) under the tongue every 5 (five) minutes x 3 doses as needed for chest pain.  Marland Kitchen omeprazole (PRILOSEC) 20 MG capsule TAKE 1 CAPSULE BY MOUTH EVERY DAY  . ondansetron (ZOFRAN) 4 MG tablet Take 1 tablet (4 mg total) by mouth every 6 (six) hours as needed for nausea.  Marland Kitchen oxyCODONE (OXY IR/ROXICODONE) 5 MG immediate release tablet Take 1 tablet (5 mg total) by mouth every 6 (six) hours as needed for severe pain or breakthrough pain.  Marland Kitchen venlafaxine XR (EFFEXOR-XR) 37.5 MG 24 hr capsule TAKE IN ADDITION TO 75 MG TABLETS  . venlafaxine XR (EFFEXOR-XR) 75 MG 24 hr capsule TAKE 1 CAPSULE (75 MG TOTAL) BY MOUTH DAILY WITH BREAKFAST.   No facility-administered encounter medications on file as of 02/09/2020.    Allergies: No Known Allergies  Family History: Family History  Problem Relation Age of Onset  . Cirrhosis Mother   . Alcohol abuse Mother   . Pancreatic cancer Mother   . Heart disease Father   . Hypertension Father   . Peripheral vascular disease Father   . Coronary artery disease Father   . Depression Sister   . Heart attack Neg Hx   . Stroke Neg Hx   . Stomach cancer Neg Hx   . Colon cancer Neg Hx   . Esophageal cancer Neg Hx     Social History: Social History   Tobacco Use  . Smoking status: Former Smoker    Packs/day: 1.00    Years: 25.00    Pack years: 25.00    Types: Cigarettes    Quit date: 01/14/2000    Years since quitting: 20.0  . Smokeless tobacco: Never Used  Vaping Use  . Vaping Use: Never used  Substance  Use Topics  . Alcohol use: Yes    Alcohol/week: 3.0 standard drinks    Types: 1 Cans of beer, 2 Shots of liquor per week  . Drug use: No   Social History   Social History Narrative   2 kids, 4 grandkids, separated   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   Right handed   Drinks caffeine   Single story home    Vital Signs:  BP (!) 145/93   Pulse 91   Resp 18   Ht _0  (1.676 m)   Wt 188 lb (85.3 kg)   SpO2 96%   BMI 30.34  kg/m    Neurological Exam: MENTAL STATUS including orientation to time, place, person, recent and remote memory, attention span and concentration, language, and fund of knowledge is normal.  Speech is not dysarthric.  CRANIAL NERVES: II:  No visual field defects.   III-IV-VI: Pupils equal round and reactive to light.  Normal conjugate, extra-ocular eye movements in all directions of gaze.  No nystagmus.  No ptosis.   V:  Normal facial sensation.    VII:  Normal facial symmetry and movements.   VIII:  Normal hearing and vestibular function.   IX-X:  Normal palatal movement.   XI:  Normal shoulder shrug and head rotation.   XII:  Normal tongue strength and range of motion, no deviation or fasciculation.  MOTOR:  No atrophy, fasciculations or abnormal movements.  No pronator drift.   Upper Extremity:  Right  Left  Deltoid  5/5   5/5   Biceps  5/5   5/5   Triceps  5/5   5/5   Infraspinatus 5/5  5/5  Medial pectoralis 5/5  5/5  Wrist extensors  5/5   5/5   Wrist flexors  5/5   5/5   Finger extensors  5/5   5/5   Finger flexors  5/5   5/5   Dorsal interossei  5/5   5/5   Abductor pollicis  5/5   5/5   Tone (Ashworth scale)  0  0   Lower Extremity:  Right  Left  Hip flexors  5/5   5/5   Hip extensors  5/5   5/5   Adductor 5/5  5/5  Abductor 5/5  5/5  Knee flexors  5/5   5/5   Knee extensors  5/5   5/5   Dorsiflexors  5/5   5/5   Plantarflexors  5/5   5/5   Toe extensors  5/5   5/5   Toe flexors  5/5   5/5    Tone (Ashworth scale)  0  0   MSRs:  Right        Left                  brachioradialis 2+  2+  biceps 2+  2+  triceps 2+  2+  patellar 2+  2+  ankle jerk 1+  1+  Hoffman no  no  plantar response down  down   SENSORY:  Vibration absent at the great toe, reduced at the ankles bilaterally. Temperature and pin prick reduced over the feet.  Rhombeg sign is markedly positive.  COORDINATION/GAIT: Normal finger-to- nose-finger.  Intact rapid alternating movements bilaterally. Gait shows small steps, mildly wide-based, unassisted. He is able to walk on toes and heel.  Unable to perform tandem gait due to unsteadiness.   IMPRESSION: 1.  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  Patient denies sensory changes, but exam shows findings consistent with neuropathy.  I do not appreciate distal weakness/foot drop  - NCS/EMG of the legs to evaluate for neuropathy  - Check vitamin B12, vitamin B1, folate, copper, SPEP with IFE  - PT declined, he will start home exercises  2. History of multiple sclerosis in 1960s-1970s. I do not think that his current symptomology is related to MS, but since he has no recent imaging, I will check MRI brain wwo contrast.  Further recommendations pending results.    Thank you for allowing me to participate in patient's care.  If I can answer any additional questions,  I would be pleased to do so.    Sincerely,    Roy [name] K. Posey Pronto, DO

## 2020-02-18 LAB — IMMUNOFIXATION ELECTROPHORESIS
IgG (Immunoglobin G), Serum: 875 mg/dL (ref 600–1540)
IgM, Serum: 69 mg/dL (ref 50–300)
Immunofix Electr Int: NOT DETECTED
Immunoglobulin A: 230 mg/dL (ref 70–320)

## 2020-02-18 LAB — PROTEIN ELECTROPHORESIS, SERUM
Albumin ELP: 4.1 g/dL (ref 3.8–4.8)
Alpha 1: 0.3 g/dL (ref 0.2–0.3)
Alpha 2: 0.7 g/dL (ref 0.5–0.9)
Beta 2: 0.4 g/dL (ref 0.2–0.5)
Beta Globulin: 0.5 g/dL (ref 0.4–0.6)
Gamma Globulin: 0.8 g/dL (ref 0.8–1.7)
Total Protein: 6.7 g/dL (ref 6.1–8.1)

## 2020-02-18 LAB — VITAMIN B1: Vitamin B1 (Thiamine): 14 nmol/L (ref 8–30)

## 2020-02-18 LAB — COPPER, SERUM: Copper: 75 ug/dL (ref 70–175)

## 2020-02-20 ENCOUNTER — Telehealth: Payer: Self-pay

## 2020-02-20 NOTE — Telephone Encounter (Signed)
Called patient and left message for a call back.  

## 2020-02-20 NOTE — Telephone Encounter (Signed)
-----   Message from Donika K Patel, DO sent at 02/20/2020  8:02 AM EST ----- Please notify patient lab are within normal limits.  Thank you.  

## 2020-02-21 NOTE — Telephone Encounter (Signed)
Called patient and informed him of normal results.

## 2020-02-21 NOTE — Telephone Encounter (Signed)
-----   Message from Alda Berthold, DO sent at 02/20/2020  8:02 AM EST ----- Please notify patient lab are within normal limits.  Thank you.

## 2020-02-24 ENCOUNTER — Ambulatory Visit (INDEPENDENT_AMBULATORY_CARE_PROVIDER_SITE_OTHER): Payer: Medicare HMO

## 2020-02-24 DIAGNOSIS — I2510 Atherosclerotic heart disease of native coronary artery without angina pectoris: Secondary | ICD-10-CM | POA: Diagnosis not present

## 2020-02-24 DIAGNOSIS — I10 Essential (primary) hypertension: Secondary | ICD-10-CM

## 2020-02-24 DIAGNOSIS — Z9181 History of falling: Secondary | ICD-10-CM

## 2020-02-25 ENCOUNTER — Other Ambulatory Visit: Payer: Self-pay | Admitting: *Deleted

## 2020-02-25 NOTE — Patient Outreach (Signed)
[facility] [facility]) Care Management  02/25/2020  Roy [name] 05/18/1945 [id number]   Member now active with Chronic Care Management, will close to [name].  [name], [place], MSN [place] [phone]

## 2020-02-25 NOTE — Patient Instructions (Addendum)
Visit Information  PATIENT GOALS:  Goals Addressed              This Visit's Progress   .  Improve My Heart Health-Coronary Artery Disease        Timeframe:  Long-Range Goal Priority:  High Start Date:  02/24/2020                          Expected End Date:  06/11/2020                    Follow Up Date 03/22/2020   - Watch for signs of a heart attack: chest pain or discomfort, feeling weak, lightheaded or faint, pain or discomfort in the jaw, neck or back, Pain or discomfort in one or both arms or shoulders, Shortness of breath. - Call 911 for severe symptoms - Follow up with your cardiologist as recommended and report any ongoing or new symptoms to your doctor as soon as possible.  - Take your medications as prescribed.  - Check your nitroglycerin medication expiration date and refill if expired. - Monitor your blood pressure at least once every 2 weeks.  - Eat a heart healthy diet consisting of fruits, vegetables and whole grains - and low in saturated fat, cholesterol, sodium (salt) and added sugar. - Discuss adding exercise to your daily routine with your doctor   Why is this important?    Lifestyle changes are key to improving the blood flow to your heart. Think about the things you can change and set a goal to live healthy.   Remember, when the blood vessels to your heart start to get clogged you may not have any symptoms.   Over time, they can get worse.   Don't ignore the signs, like chest pain, and get help right away.     Notes:     .  Patient will report having no falls        Timeframe:  Long-Range Goal Priority:  High Start Date:  02/24/2020                           Expected End Date: 05/23/2020                  Follow Up Date 03/22/2020    - always use handrails on the stairs - keep my cell phone with me always - Keep walkways clear of clutter and avoid throw rugs or use non slip rugs - use assistive walking device as needed or as advised by your provider -  use a nightlight in the bathroom/ room/ hallway - wear my glasses and/or hearing aid - attend therapy (Discuss need for physical therapy with your provider).  - Follow up with the neurologist as advised.  - Wear secure fitting shoes at all times with ambulation Why is this important?    Most falls happen when it is hard for you to walk safely. Your balance may be off because of an illness. You may have pain in your knees, hip or other joints.   You may be overly tired or taking medicines that make you sleepy. You may not be able to see or hear clearly.   Falls can lead to broken bones, bruises or other injuries.   There are things you can do to help prevent falling.     Notes:     .  Patient will report returning  to his parttime job in 6 months        Timeframe:  Long-Range Goal Priority:  High Start Date:  02/24/2019                           Expected End Date: 8/130/2022                    Follow Up Date 03/22/2020   - talk with my employer about a plan to go back to work  - Follow up with providers as recommended and discuss return to work strategy - Take medications as prescribed.  -    Why is this important?    You might be worried about going back to work after a heart attack.   You may wonder if you will have another heart attack if you do.    You might also think that you can't do what you used to do.   Going back to work is possible.    You, your doctor and employer will need to work together to figure how to make it okay for you.     Notes:     .  Track and Manage My Blood Pressure-Hypertension        Timeframe:  Long-Range Goal Priority:  High Start Date: 02/24/2020                            Expected End Date:   06/11/2020                    Follow Up Date 03/22/2020   - check blood pressure at least once every 2 weeks.  - Take medications as prescribed - Attends all scheduled provider appointments - Calls provider office for new concerns, questions, or  BP outside discussed parameters - Checks BP and record as discussed - Follows a low sodium diet    Why is this important?    You won't feel high blood pressure, but it can still hurt your blood vessels.   High blood pressure can cause heart or kidney problems. It can also cause a stroke.   Making lifestyle changes like losing a little weight or eating less salt will help.   Checking your blood pressure at home and at different times of the day can help to control blood pressure.   If the doctor prescribes medicine remember to take it the way the doctor ordered.   Call the office if you cannot afford the medicine or if there are questions about it.     Notes:         DASH Eating Plan DASH stands for Dietary Approaches to Stop Hypertension. The DASH eating plan is a healthy eating plan that has been shown to:  Reduce high blood pressure (hypertension).  Reduce your risk for type 2 diabetes, heart disease, and stroke.  Help with weight loss. What are tips for following this plan? Reading food labels  Check food labels for the amount of salt (sodium) per serving. Choose foods with less than 5 percent of the Daily Value of sodium. Generally, foods with less than 300 milligrams (mg) of sodium per serving fit into this eating plan.  To find whole grains, look for the word "whole" as the first word in the ingredient list. Shopping  Buy products labeled as "low-sodium" or "no salt added."  Buy fresh foods. Avoid canned foods  and pre-made or frozen meals. Cooking  Avoid adding salt when cooking. Use salt-free seasonings or herbs instead of table salt or sea salt. Check with your health care provider or pharmacist before using salt substitutes.  Do not fry foods. Cook foods using healthy methods such as baking, boiling, grilling, roasting, and broiling instead.  Cook with heart-healthy oils, such as olive, canola, avocado, soybean, or sunflower oil. Meal planning  Eat a  balanced diet that includes: ? 4 or more servings of fruits and 4 or more servings of vegetables each day. Try to fill one-half of your plate with fruits and vegetables. ? 6-8 servings of whole grains each day. ? Less than 6 oz (170 g) of lean meat, poultry, or fish each day. A 3-oz (85-g) serving of meat is about the same size as a deck of cards. One egg equals 1 oz (28 g). ? 2-3 servings of low-fat dairy each day. One serving is 1 cup (237 mL). ? 1 serving of nuts, seeds, or beans 5 times each week. ? 2-3 servings of heart-healthy fats. Healthy fats called omega-3 fatty acids are found in foods such as walnuts, flaxseeds, fortified milks, and eggs. These fats are also found in cold-water fish, such as sardines, salmon, and mackerel.  Limit how much you eat of: ? Canned or prepackaged foods. ? Food that is high in trans fat, such as some fried foods. ? Food that is high in saturated fat, such as fatty meat. ? Desserts and other sweets, sugary drinks, and other foods with added sugar. ? Full-fat dairy products.  Do not salt foods before eating.  Do not eat more than 4 egg yolks a week.  Try to eat at least 2 vegetarian meals a week.  Eat more home-cooked food and less restaurant, buffet, and fast food.   Lifestyle  When eating at a restaurant, ask that your food be prepared with less salt or no salt, if possible.  If you drink alcohol: ? Limit how much you use to:  0-1 drink a day for women who are not pregnant.  0-2 drinks a day for men. ? Be aware of how much alcohol is in your drink. In the [place] one drink equals one 12 oz bottle of beer (355 mL), one 5 oz glass of wine (148 mL), or one 1 oz glass of hard liquor (44 mL). General information  Avoid eating more than 2,300 mg of salt a day. If you have hypertension, you may need to reduce your sodium intake to 1,500 mg a day.  Work with your health care provider to maintain a healthy body weight or to lose weight. Ask what an  ideal weight is for you.  Get at least 30 minutes of exercise that causes your heart to beat faster (aerobic exercise) most days of the week. Activities may include walking, swimming, or biking.  Work with your health care provider or dietitian to adjust your eating plan to your individual calorie needs. What foods should I eat? Fruits All fresh, dried, or frozen fruit. Canned fruit in natural juice (without added sugar). Vegetables Fresh or frozen vegetables (raw, steamed, roasted, or grilled). Low-sodium or reduced-sodium tomato and vegetable juice. Low-sodium or reduced-sodium tomato sauce and tomato paste. Low-sodium or reduced-sodium canned vegetables. Grains Whole-grain or whole-wheat bread. Whole-grain or whole-wheat pasta. Brown rice. [place]. Bulgur. Whole-grain and low-sodium cereals. Pita bread. Low-fat, low-sodium crackers. Whole-wheat flour tortillas. Meats and other proteins Skinless chicken or [place]. Ground chicken or [place].  Pork with fat trimmed off. Fish and seafood. Egg whites. Dried beans, peas, or lentils. Unsalted nuts, nut butters, and seeds. Unsalted canned beans. Lean cuts of beef with fat trimmed off. Low-sodium, lean precooked or cured meat, such as sausages or meat loaves. Dairy Low-fat (1%) or fat-free (skim) milk. Reduced-fat, low-fat, or fat-free cheeses. Nonfat, low-sodium ricotta or cottage cheese. Low-fat or nonfat yogurt. Low-fat, low-sodium cheese. Fats and oils Soft margarine without trans fats. Vegetable oil. Reduced-fat, low-fat, or light mayonnaise and salad dressings (reduced-sodium). Canola, safflower, olive, avocado, soybean, and sunflower oils. Avocado. Seasonings and condiments Herbs. Spices. Seasoning mixes without salt. Other foods Unsalted popcorn and pretzels. Fat-free sweets. The items listed above may not be a complete list of foods and beverages you can eat. Contact a dietitian for more information. What foods should I  avoid? Fruits Canned fruit in a light or heavy syrup. Fried fruit. Fruit in cream or butter sauce. Vegetables Creamed or fried vegetables. Vegetables in a cheese sauce. Regular canned vegetables (not low-sodium or reduced-sodium). Regular canned tomato sauce and paste (not low-sodium or reduced-sodium). Regular tomato and vegetable juice (not low-sodium or reduced-sodium). Angie Fava. Olives. Grains Baked goods made with fat, such as croissants, muffins, or some breads. Dry pasta or rice meal packs. Meats and other proteins Fatty cuts of meat. Ribs. Fried meat. Berniece Salines. Bologna, salami, and other precooked or cured meats, such as sausages or meat loaves. Fat from the back of a pig (fatback). Bratwurst. Salted nuts and seeds. Canned beans with added salt. Canned or smoked fish. Whole eggs or egg yolks. Chicken or [place] with skin. Dairy Whole or 2% milk, cream, and half-and-half. Whole or full-fat cream cheese. Whole-fat or sweetened yogurt. Full-fat cheese. Nondairy creamers. Whipped toppings. Processed cheese and cheese spreads. Fats and oils Butter. Stick margarine. Lard. Shortening. Ghee. Bacon fat. Tropical oils, such as coconut, palm kernel, or palm oil. Seasonings and condiments Onion salt, garlic salt, seasoned salt, table salt, and sea salt. Worcestershire sauce. Tartar sauce. Barbecue sauce. Teriyaki sauce. Soy sauce, including reduced-sodium. Steak sauce. Canned and packaged gravies. Fish sauce. Oyster sauce. Cocktail sauce. Store-bought horseradish. Ketchup. Mustard. Meat flavorings and tenderizers. Bouillon cubes. Hot sauces. Pre-made or packaged marinades. Pre-made or packaged taco seasonings. Relishes. Regular salad dressings. Other foods Salted popcorn and pretzels. The items listed above may not be a complete list of foods and beverages you should avoid. Contact a dietitian for more information. Where to find more information  National Heart, Lung, and Blood Institute:  https://wilson-eaton.com/  American Heart Association: www.heart.org  Academy of Nutrition and Dietetics: www.eatright.[place]: www.kidney.org Summary  The DASH eating plan is a healthy eating plan that has been shown to reduce high blood pressure (hypertension). It may also reduce your risk for type 2 diabetes, heart disease, and stroke.  When on the DASH eating plan, aim to eat more fresh fruits and vegetables, whole grains, lean proteins, low-fat dairy, and heart-healthy fats.  With the DASH eating plan, you should limit salt (sodium) intake to 2,300 mg a day. If you have hypertension, you may need to reduce your sodium intake to 1,500 mg a day.  Work with your health care provider or dietitian to adjust your eating plan to your individual calorie needs. This information is not intended to replace advice given to you by your health care provider. Make sure you discuss any questions you have with your health care provider. Document Revised: 12/03/2018 Document Reviewed: 12/03/2018 Elsevier Patient Education  2021 Elsevier  Inc.  Fall Prevention in the Home, Adult Falls can cause injuries and can happen to people of all ages. There are many things you can do to make your home safe and to help prevent falls. Ask for help when making these changes. What actions can I take to prevent falls? General Instructions  Use good lighting in all rooms. Replace any light bulbs that burn out.  Turn on the lights in dark areas. Use night-lights.  Keep items that you use often in easy-to-reach places. Lower the shelves around your home if needed.  Set up your furniture so you have a clear path. Avoid moving your furniture around.  Do not have throw rugs or other things on the floor that can make you trip.  Avoid walking on wet floors.  If any of your floors are uneven, fix them.  Add color or contrast paint or tape to clearly mark and help you see: ? Grab bars or  handrails. ? First and last steps of staircases. ? Where the edge of each step is.  If you use a stepladder: ? Make sure that it is fully opened. Do not climb a closed stepladder. ? Make sure the sides of the stepladder are locked in place. ? Ask someone to hold the stepladder while you use it.  Know where your pets are when moving through your home. What can I do in the bathroom?  Keep the floor dry. Clean up any water on the floor right away.  Remove soap buildup in the tub or shower.  Use nonskid mats or decals on the floor of the tub or shower.  Attach bath mats securely with double-sided, nonslip rug tape.  If you need to sit down in the shower, use a plastic, nonslip stool.  Install grab bars by the toilet and in the tub and shower. Do not use towel bars as grab bars.      What can I do in the bedroom?  Make sure that you have a light by your bed that is easy to reach.  Do not use any sheets or blankets for your bed that hang to the floor.  Have a firm chair with side arms that you can use for support when you get dressed. What can I do in the kitchen?  Clean up any spills right away.  If you need to reach something above you, use a step stool with a grab bar.  Keep electrical cords out of the way.  Do not use floor polish or wax that makes floors slippery. What can I do with my stairs?  Do not leave any items on the stairs.  Make sure that you have a light switch at the top and the bottom of the stairs.  Make sure that there are handrails on both sides of the stairs. Fix handrails that are broken or loose.  Install nonslip stair treads on all your stairs.  Avoid having throw rugs at the top or bottom of the stairs.  Choose a carpet that does not hide the edge of the steps on the stairs.  Check carpeting to make sure that it is firmly attached to the stairs. Fix carpet that is loose or worn. What can I do on the outside of my home?  Use bright outdoor  lighting.  Fix the edges of walkways and driveways and fix any cracks.  Remove anything that might make you trip as you walk through a door, such as a raised step or threshold.  Trim any bushes or trees on paths to your home.  Check to see if handrails are loose or broken and that both sides of all steps have handrails.  Install guardrails along the edges of any raised decks and porches.  Clear paths of anything that can make you trip, such as tools or rocks.  Have leaves, snow, or ice cleared regularly.  Use sand or salt on paths during winter.  Clean up any spills in your garage right away. This includes grease or oil spills. What other actions can I take?  Wear shoes that: ? Have a low heel. Do not wear high heels. ? Have rubber bottoms. ? Feel good on your feet and fit well. ? Are closed at the toe. Do not wear open-toe sandals.  Use tools that help you move around if needed. These include: ? Canes. ? Walkers. ? Scooters. ? Crutches.  Review your medicines with your doctor. Some medicines can make you feel dizzy. This can increase your chance of falling. Ask your doctor what else you can do to help prevent falls. Where to find more information  Centers for Disease Control and Prevention, STEADI: http://www.wolf.info/  National Institute on Aging: http://kim-miller.com/ Contact a doctor if:  You are afraid of falling at home.  You feel weak, drowsy, or dizzy at home.  You fall at home. Summary  There are many simple things that you can do to make your home safe and to help prevent falls.  Ways to make your home safe include removing things that can make you trip and installing grab bars in the bathroom.  Ask for help when making these changes in your home. This information is not intended to replace advice given to you by your health care provider. Make sure you discuss any questions you have with your health care provider. Document Revised: 08/03/2019 Document Reviewed:  08/03/2019 Elsevier Patient Education  2021 [place].   Consent to CCM Services: Roy [name] was given information about Chronic Care Management services today including:  1. CCM service includes personalized support from designated clinical staff supervised by his physician, including individualized plan of care and coordination with other care providers 2. 24/7 contact phone numbers for assistance for urgent and routine care needs. 3. Service will only be billed when office clinical staff spend 20 minutes or more in a month to coordinate care. 4. Only one practitioner may furnish and bill the service in a calendar month. 5. The patient may stop CCM services at any time (effective at the end of the month) by phone call to the office staff. 6. The patient will be responsible for cost sharing (co-pay) of up to 20% of the service fee (after annual deductible is met).  Patient agreed to services and verbal consent obtained.   Patient verbalizes understanding of instructions provided today and agrees to view in [place].   The patient has been provided with contact information for the care management team and has been advised to call with any health related questions or concerns.  The care management team will reach out to the patient again over the next 30 days.   Quinn Plowman RN,BSN,CCM RN Case Manager [facility]  [phone]  CLINICAL CARE PLAN: Patient Care Plan: Fall Risk (Adult)  Problem Identified: Fall Risk     Goal: Absence of Fall and Fall-Related Injury   Start Date: 02/24/2020  Expected End Date: 05/23/2020  This Visit's Progress: On track  Priority: High  Note:   Current  Barriers:  Marland Kitchen Knowledge Deficits related to fall precautions in patient with symptoms of balance issues  Clinical Goal(s):  Marland Kitchen Patient will demonstrate improved adherence to prescribed treatment plan for decreasing falls  . Patient will not experience additional falls . Patient will  verbalize understanding of plan for fall precautions . Patient will attend all scheduled medical appointments: Follow up with the neurologist as scheduled.   . Interventions:  . Collaboration with Jinny Sanders, MD regarding development and update of comprehensive plan of care as evidenced by provider attestation and co-signature . Inter-disciplinary care team collaboration (see longitudinal plan of care) . Provided written and verbal education re: Potential causes of falls and Fall prevention strategies . Reviewed medications and discussed potential side effects of medications such as dizziness and frequent urination . Assessed for falls since last encounter. . Assessed patients knowledge of fall risk prevention Patient Goals/Self-Care Activities . Patient will:   - always use handrails on the stairs - keep my cell phone with me always - Keep walkways clear of clutter and avoid throw rugs or use non slip rugs - use assistive walking device as needed or as advised by your provider - use a nightlight in the bathroom/ room/ hallway - wear my glasses and/or hearing aid - attend therapy (Discuss need for physical therapy with your provider).  - Follow up with the neurologist as advised.  - Wear secure fitting shoes at all times with ambulation  Follow Up Plan: The patient has been provided with contact information for the care management team and has been advised to call with any health related questions or concerns.  The care management team will reach out to the patient again over the next 30 days.   Patient Care Plan: Coronary Artery Disease (Adult)  Problem Identified: Knowledge deficit related to coronary artery disease management   Priority: High  Onset Date: 02/24/2020  Goal: Disease Progression Prevented or Minimized   Start Date: 02/24/2020  Expected End Date: 06/11/2020  This Visit's Progress: On track  Priority: High  Current Barriers:  Marland Kitchen Knowledge deficit related to self  management of coronary artery disease Clinical Goal(s) related to coronary artery disease    patient will:  . Work with the care management team to address educational, disease management, and care coordination needs  . Begin or continue self health monitoring activities as directed: Measure and record blood pressure at least 2-3 times per week.  . Call provider office for new or worsened signs and symptoms: Chest pain, shortness of breath, weakness, light-headedness, nausea or cold sweat.  . Call care management team with questions or concerns . Keep follow up appointments with your providers . Eat a heart healthy diet consisting of fruits, vegetables and whole grains - and low in saturated fat, cholesterol, sodium (salt) and added sugar.  Interventions related to coronary artery disease:  . Evaluation of current treatment plans and patient's adherence to plan as established by provider . Assessed patient understanding of disease state . Assessed patient's education and care coordination needs . Provided disease specific education to patient  . Collaborated with appropriate clinical care team members regarding patient needs Patient Self Care Activities related to coronary artery disease:  - Watch for signs of a heart attack: chest pain or discomfort, feeling weak, lightheaded or faint, pain or discomfort in the jaw, neck or back, Pain or discomfort in one or both arms or shoulders, Shortness of breath. - Call 911 for severe symptoms - Follow up with your  cardiologist as recommended and report any ongoing or new symptoms to your doctor as soon as possible.  - Take your medications as prescribed.  - Check your nitroglycerin medication expiration date and refill if expired. - Monitor your blood pressure at least every 2-3 weeks.  - Eat a heart healthy diet consisting of fruits, vegetables and whole grains - and low in saturated fat, cholesterol, sodium (salt) and added sugar. - Discuss adding  exercise to your daily routine with your doctor  Patient Care Plan: Hypertension (Adult)  Problem Identified: Hypertension (Hypertension)   Goal: Patient will verbalize blood pressure managed without hypertensive episodes.   Start Date: 02/24/2020  Expected End Date: 06/11/2020  This Visit's Progress: On track  Priority: High  Objective:  . Last practice recorded BP readings:  BP Readings from Last 3 Encounters:  02/09/20 (!) 145/93  01/19/20 122/84  10/06/19 120/78   . Most recent eGFR/CrCl: No results found for: EGFR  No components found for: CRCL Current Barriers:  Marland Kitchen Knowledge Deficits related to basic understanding of hypertension self care management Case Manager Clinical Goal(s):  . patient will attend all scheduled medical appointments:  . patient will demonstrate improved adherence to prescribed treatment plan for hypertension as evidenced by taking all medications as prescribed, monitoring and recording blood pressure as directed, adhering to low sodium diet Interventions:  . Collaboration with Jinny Sanders, MD regarding development and update of comprehensive plan of care as evidenced by provider attestation and co-signature . Inter-disciplinary care team collaboration (see longitudinal plan of care) . Reviewed medications with patient and discussed importance of compliance . Discussed plans with patient for ongoing care management follow up and provided patient with direct contact information for care management team . Advised patient, providing education and rationale, to monitor blood pressure daily and record, calling PCP for findings outside established parameters.   verified patient has blood pressure monitor at home and verbalizes ability to use it.  Patient Goals/Self-Care Activities . patient will:  - check blood pressure at least once every 2 weeks.  - Take medications as prescribed - Attends all scheduled provider appointments - Calls provider office for new  concerns, questions, or BP outside discussed parameters - Checks BP and record as discussed - Follows a low sodium diet Follow Up Plan: The patient has been provided with contact information for the care management team and has been advised to call with any health related questions or concerns.  The care management team will reach out to the patient again over the next 30 days.         [email or web address]">

## 2020-02-27 NOTE — Chronic Care Management (AMB) (Signed)
Chronic Care Management   CCM RN Visit Note  02/27/2020 Name: Roy [name] MRN: [id number] DOB: 1945/03/04  Subjective: Roy [name] is a 75 y.o. year old male who is a primary care patient of [name], [name], MD. The care management team was consulted for assistance with disease management and care coordination needs.    Engaged with patient by telephone for initial visit in response to provider referral for case management and/or care coordination services.   Consent to Services:  The patient was given the following information about Chronic Care Management services today, agreed to services, and gave verbal consent: 1. CCM service includes personalized support from designated clinical staff supervised by the primary care provider, including individualized plan of care and coordination with other care providers 2. 24/7 contact phone numbers for assistance for urgent and routine care needs. 3. Service will only be billed when office clinical staff spend 20 minutes or more in a month to coordinate care. 4. Only one practitioner may furnish and bill the service in a calendar month. 5.The patient may stop CCM services at any time (effective at the end of the month) by phone call to the office staff. 6. The patient will be responsible for cost sharing (co-pay) of up to 20% of the service fee (after annual deductible is met). Patient agreed to services and consent obtained.  Patient agreed to services and verbal consent obtained.   Assessment: Review of patient past medical history, allergies, medications, health status, including review of consultants reports, laboratory and other test data, was performed as part of comprehensive evaluation and provision of chronic care management services.   SDOH (Social Determinants of Health) assessments and interventions performed:    CCM Care Plan  No Known Allergies  Outpatient Encounter Medications as of 02/24/2020  Medication Sig Note  .  acetaminophen (TYLENOL) 325 MG tablet Take 2 tablets (650 mg total) by mouth every 6 (six) hours as needed. (Patient taking differently: Take 650 mg by mouth every 6 (six) hours as needed for moderate pain.)   . carvedilol (COREG) 12.5 MG tablet TAKE 1 TABLET BY MOUTH TWICE A DAY (Patient taking differently: Take 12.5 mg by mouth 2 (two) times daily with a meal.)   . clopidogrel (PLAVIX) 75 MG tablet TAKE 1 TABLET BY MOUTH EVERY DAY (Patient taking differently: Take 75 mg by mouth daily.)   . feeding supplement, ENSURE ENLIVE, (ENSURE ENLIVE) LIQD Take 237 mLs by mouth 2 (two) times daily between meals. (Patient taking differently: Take 237 mLs by mouth daily.)   . fluocinonide (LIDEX) 0.05 % external solution Apply 1 application topically at bedtime as needed (psoriasis).   . hydrocortisone cream 1 % Apply 1 application topically 2 (two) times daily as needed for itching.   . melatonin 3 MG TABS tablet Take 6 mg by mouth at bedtime as needed (sleep).   . nitroGLYCERIN (NITROSTAT) 0.4 MG SL tablet Place 1 tablet (0.4 mg total) under the tongue every 5 (five) minutes x 3 doses as needed for chest pain.   Marland Kitchen omeprazole (PRILOSEC) 20 MG capsule TAKE 1 CAPSULE BY MOUTH EVERY DAY (Patient taking differently: Take 20 mg by mouth daily.) 02/24/2020: Patient reports he takes as needed  . triamcinolone (KENALOG) 0.1 % Apply 1 application topically 3 (three) times daily as needed (psoriasis).   . venlafaxine XR (EFFEXOR-XR) 37.5 MG 24 hr capsule TAKE IN ADDITION TO 75 MG TABLETS (Patient taking differently: Take 37.5 mg by mouth daily. Take with 75 mg  to equal 112.5 mg daily)   . venlafaxine XR (EFFEXOR-XR) 75 MG 24 hr capsule TAKE 1 CAPSULE (75 MG TOTAL) BY MOUTH DAILY WITH BREAKFAST. (Patient taking differently: Take 75 mg by mouth daily. Take with 37.5 mg to equal 112.5 mg daily)   . Magnesium Oxide 400 MG CAPS Take 1 capsule (400 mg total) by mouth daily. (Patient not taking: No sig reported)   . metroNIDAZOLE  (FLAGYL) 500 MG tablet Take 1,000 mg by mouth 3 (three) times daily. (Patient not taking: Reported on 02/24/2020) 02/16/2020: Start on 2/21 and finish on 2/22   No facility-administered encounter medications on file as of 02/24/2020.    Patient Active Problem List   Diagnosis Date Noted  . Chest pressure 09/26/2019  . SBO (small bowel obstruction) ([place]) 08/27/2019  . Diverticulitis 08/27/2019  . 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 Lactic acid acidosis 08/10/2019  . Low blood potassium 08/10/2019  . Colitis, acute 07/29/2019  . Abnormal CT scan, colon   . Protein-calorie malnutrition, severe 07/25/2019  . Large bowel obstruction ([place]) 07/23/2019  . Acute renal failure ([place]) 07/23/2019  . Controlled type 2 diabetes mellitus with hyperglycemia ([place]) 07/23/2019  . Insomnia 07/23/2019  . Hypotension 07/01/2019  . Personal history of multiple sclerosis ([place]) 12/02/2018  . Weakness 03/31/2017  . Acute constipation 05/25/2015  . Internal hemorrhoids with complication 63/87/5643  . CAD, RCA PCI 2001, urgent Dx2 DES 04/24/14 04/24/2014  . Unstable angina with ST elelvation and NSVT while on treadmill 04/24/14   . History of basal cell carcinoma 03/07/2014  . GERD (gastroesophageal reflux disease) 03/07/2014  . Incomplete emptying of bladder 03/07/2014  . Obstructive sleep apnea 08/20/2009  . Dyslipidemia 11/09/2008  . Depression, major, recurrent ([place]) 07/06/2007  . Essential hypertension 07/06/2007  . Coronary artery disease involving native coronary artery of native heart without angina pectoris 07/06/2007    Conditions to be addressed/monitored:CAD, HTN and Fall risk   Patient Care Plan: Fall Risk (Adult)  Problem Identified: Fall Risk   Goal: Absence of Fall and Fall-Related Injury   Start Date: 02/24/2020  Expected End Date: 05/23/2020  This Visit's Progress: On track  Priority: High  Current Barriers:  Marland Kitchen Knowledge Deficits related to fall precautions  in patient with symptoms of balance issues  Clinical Goal(s):  Marland Kitchen Patient will demonstrate improved adherence to prescribed treatment plan for decreasing falls  . Patient will not experience additional falls . Patient will verbalize understanding of plan for fall precautions . Patient will attend all scheduled medical appointments: Follow up with the neurologist as scheduled.   . Interventions:  . Collaboration with Jinny Sanders, MD regarding development and update of comprehensive plan of care as evidenced by provider attestation and co-signature . Inter-disciplinary care team collaboration (see longitudinal plan of care) . Provided written and verbal education re: Potential causes of falls and Fall prevention strategies . Reviewed medications and discussed potential side effects of medications such as dizziness and frequent urination . Assessed for falls since last encounter. . Assessed patients knowledge of fall risk prevention Patient Goals/Self-Care Activities Patient will:   - always use handrails on the stairs - keep my cell phone with me always - Keep walkways clear of clutter and avoid throw rugs or use non slip rugs - use assistive walking device as needed or as advised by your provider - use a nightlight in the bathroom/ room/ hallway - wear my glasses and/or hearing aid - attend therapy (Discuss need for physical  therapy with your provider).  - Follow up with the neurologist as advised.  - Wear secure fitting shoes at all times with ambulation  Follow Up Plan: The patient has been provided with contact information for the care management team and has been advised to call with any health related questions or concerns.  The care management team will reach out to the patient again over the next 30 days.   Patient Care Plan: Coronary Artery Disease (Adult)  Problem Identified: Knowledge deficit related to coronary artery disease management   Priority: High  Onset Date:  02/24/2020  Goal: Disease Progression Prevented or Minimized   Start Date: 02/24/2020  Expected End Date: 06/11/2020  This Visit's Progress: On track  Priority: High  Current Barriers:  Marland Kitchen Knowledge deficit related to self management of coronary artery disease Clinical Goal(s) related to coronary artery disease    patient will:  . Work with the care management team to address educational, disease management, and care coordination needs  . Begin or continue self health monitoring activities as directed: Measure and record blood pressure at least 2-3 times per week.  . Call provider office for new or worsened signs and symptoms: Chest pain, shortness of breath, weakness, light-headedness, nausea or cold sweat.  . Call care management team with questions or concerns . Keep follow up appointments with your providers . Eat a heart healthy diet consisting of fruits, vegetables and whole grains - and low in saturated fat, cholesterol, sodium (salt) and added sugar.  Interventions related to coronary artery disease:  . Evaluation of current treatment plans and patient's adherence to plan as established by provider . Assessed patient understanding of disease state . Assessed patient's education and care coordination needs . Provided disease specific education to patient  . Collaborated with appropriate clinical care team members regarding patient needs Patient Self Care Activities related to coronary artery disease:  - Watch for signs of a heart attack: chest pain or discomfort, feeling weak, lightheaded or faint, pain or discomfort in the jaw, neck or back, Pain or discomfort in one or both arms or shoulders, Shortness of breath. - Call 911 for severe symptoms - Follow up with your cardiologist as recommended and report any ongoing or new symptoms to your doctor as soon as possible.  - Take your medications as prescribed.  - Check your nitroglycerin medication expiration date and refill if expired. -  Monitor your blood pressure at least every 2-3 weeks.  - Eat a heart healthy diet consisting of fruits, vegetables and whole grains - and low in saturated fat, cholesterol, sodium (salt) and added sugar. - Discuss adding exercise to your daily routine with your doctor  Patient Care Plan: Hypertension (Adult)  Problem Identified: Hypertension (Hypertension)   Goal: Patient will verbalize blood pressure managed without hypertensive episodes.   Start Date: 02/24/2020  Expected End Date: 06/11/2020  This Visit's Progress: On track  Priority: High  Objective:  . Last practice recorded BP readings:  BP Readings from Last 3 Encounters:  02/09/20 (!) 145/93  01/19/20 122/84  10/06/19 120/78   . Most recent eGFR/CrCl: No results found for: EGFR  No components found for: CRCL Current Barriers:  Marland Kitchen Knowledge Deficits related to basic understanding of hypertension self care management Case Manager Clinical Goal(s):  . patient will attend all scheduled medical appointments:  . patient will demonstrate improved adherence to prescribed treatment plan for hypertension as evidenced by taking all medications as prescribed, monitoring and recording blood pressure as directed,  adhering to low sodium diet Interventions:  . Collaboration with Jinny Sanders, MD regarding development and update of comprehensive plan of care as evidenced by provider attestation and co-signature . Inter-disciplinary care team collaboration (see longitudinal plan of care) . Reviewed medications with patient and discussed importance of compliance . Discussed plans with patient for ongoing care management follow up and provided patient with direct contact information for care management team . Advised patient, providing education and rationale, to monitor blood pressure daily and record, calling PCP for findings outside established parameters.  . Verified patient has blood pressure monitor at home and verbalizes ability to use it.   Patient Goals/Self-Care Activities . patient will:  - check blood pressure at least once every 2 weeks.  - Take medications as prescribed - Attends all scheduled provider appointments - Calls provider office for new concerns, questions, or BP outside discussed parameters - Checks BP and record as discussed - Follows a low sodium diet Follow Up Plan: The patient has been provided with contact information for the care management team and has been advised to call with any health related questions or concerns.  The care management team will reach out to the patient again over the next 30 days.     Plan:The patient has been provided with contact information for the care management team and has been advised to call with any health related questions or concerns.  and The care management team will reach out to the patient again over the next 30 days.  Quinn Plowman RN,BSN,CCM RN Case Manager [place]  [phone]

## 2020-02-27 NOTE — Progress Notes (Signed)
DUE TO COVID-19 ONLY ONE VISITOR IS ALLOWED TO COME WITH YOU AND STAY IN THE WAITING ROOM ONLY DURING PRE OP AND PROCEDURE DAY OF SURGERY. THE 1 VISITOR  MAY VISIT WITH YOU AFTER SURGERY IN YOUR PRIVATE ROOM DURING VISITING HOURS ONLY!  YOU NEED TO HAVE A COVID 19 TEST ON___2/01/2020 ____ @_______ , THIS TEST MUST BE DONE BEFORE SURGERY,  COVID TESTING SITE [address] [place] [id number], IT IS ON THE RIGHT GOING OUT [address] APPROXIMATELY  2 MINUTES PAST [employer] ON THE RIGHT. ONCE YOUR COVID TEST IS COMPLETED,  PLEASE BEGIN THE QUARANTINE INSTRUCTIONS AS OUTLINED IN YOUR HANDOUT.                Roy [name]  02/27/2020   Your procedure is scheduled on: 03/07/2020    Report to [facility] Main  Entrance   Report to admitting at     0630AM  AM     Call this number if you have problems the morning of surgery [phone]           REMEMBER: FOLLOW ALL YOUR BOWEL PREP INSTRUCTIONS WITH CLEAR LIQUIDS FROM [place]. DRINK 2 PRESURGERY ENSURE DRINKS THE NIGHT BEFORE SURGERY AT 1000 PM AND 1 PRESURGERY DRINK THE DAY OF THE PROCEDURE 3 HOURS PRIOR TO SCHEDULED SURGERY.  NOTHING BY MOUTH EXCEPT CLEAR LIQUIDS UNTIL THREE HOURS PRIOR TO SCHEDULED SURGERY. PLEASE FINISH PRESURGERY 3RD  ENSURE  DRINK PER SURGEON ORDER 3 HOURS PRIOR TO SCHEDULED SURGERY TIME WHICH NEEDS TO BE COMPLETED AT __0557m ______.     CLEAR LIQUID DIET   Foods Allowed                                                                      Coffee and tea, regular and decaf                           Plain Jell-O any favor except red or purple                                         Fruit ices (not with fruit pulp)                                      Iced Popsicles                                     Carbonated beverages, regular and diet                                    Cranberry, grape and apple juices Sports drinks like Gatorade Lightly seasoned clear broth or  consume(fat free) Sugar, honey syrup  _____________________________________________________________________      BRUSH YOUR TEETH MORNING OF SURGERY AND RINSE YOUR MOUTH OUT, NO CHEWING GUM CANDY OR MINTS.  Take these medicines the morning of surgery with A SIP OF WATER: Coreg, Prilosec, Effexor  DO NOT TAKE ANY DIABETIC MEDICATIONS DAY OF YOUR SURGERY                               You may not have any metal on your body including hair pins and              piercings  Do not wear jewelry, make-up, lotions, powders or perfumes, deodorant             Do not wear nail polish on your fingernails.  Do not shave  48 hours prior to surgery.              Men may shave face and neck.   Do not bring valuables to the hospital. [place].  Contacts, dentures or bridgework may not be worn into surgery.  Leave suitcase in the car. After surgery it may be brought to your room.                 Please read over the following fact sheets you were given: _____________________________________________________________________  [facility] - Preparing for Surgery Before surgery, you can play an important role.  Because skin is not sterile, your skin needs to be as free of germs as possible.  You can reduce the number of germs on your skin by washing with CHG (chlorahexidine gluconate) soap before surgery.  CHG is an antiseptic cleaner which kills germs and bonds with the skin to continue killing germs even after washing. Please DO NOT use if you have an allergy to CHG or antibacterial soaps.  If your skin becomes reddened/irritated stop using the CHG and inform your nurse when you arrive at Short Stay. Do not shave (including legs and underarms) for at least 48 hours prior to the first CHG shower.  You may shave your face/neck. Please follow these instructions carefully:  1.  Shower with CHG Soap the night before surgery and the  morning of Surgery.  2.   If you choose to wash your hair, wash your hair first as usual with your  normal  shampoo.  3.  After you shampoo, rinse your hair and body thoroughly to remove the  shampoo.                           4.  Use CHG as you would any other liquid soap.  You can apply chg directly  to the skin and wash                       Gently with a scrungie or clean washcloth.  5.  Apply the CHG Soap to your body ONLY FROM THE NECK DOWN.   Do not use on face/ open                           Wound or open sores. Avoid contact with eyes, ears mouth and genitals (private parts).                       Wash face,  Genitals (private parts) with your normal soap.  6.  Wash thoroughly, paying special attention to the area where your surgery  will be performed.  7.  Thoroughly rinse your body with warm water from the neck down.  8.  DO NOT shower/wash with your normal soap after using and rinsing off  the CHG Soap.                9.  Pat yourself dry with a clean towel.            10.  Wear clean pajamas.            11.  Place clean sheets on your bed the night of your first shower and do not  sleep with pets. Day of Surgery : Do not apply any lotions/deodorants the morning of surgery.  Please wear clean clothes to the hospital/surgery center.  FAILURE TO FOLLOW THESE INSTRUCTIONS MAY RESULT IN THE CANCELLATION OF YOUR SURGERY PATIENT SIGNATURE_________________________________  NURSE SIGNATURE__________________________________  ________________________________________________________________________

## 2020-02-28 ENCOUNTER — Other Ambulatory Visit: Payer: Self-pay

## 2020-02-28 DIAGNOSIS — M48061 Spinal stenosis, lumbar region without neurogenic claudication: Secondary | ICD-10-CM

## 2020-02-28 DIAGNOSIS — M4807 Spinal stenosis, lumbosacral region: Secondary | ICD-10-CM

## 2020-02-28 DIAGNOSIS — R292 Abnormal reflex: Secondary | ICD-10-CM

## 2020-02-29 ENCOUNTER — Encounter (HOSPITAL_COMMUNITY): Payer: Self-pay

## 2020-02-29 ENCOUNTER — Other Ambulatory Visit: Payer: Self-pay

## 2020-02-29 ENCOUNTER — Encounter (HOSPITAL_COMMUNITY)
Admission: RE | Admit: 2020-02-29 | Discharge: 2020-02-29 | Disposition: A | Discharge status: Home / Self Care | Payer: Medicare HMO | Source: Ambulatory Visit | Attending: Surgery | Admitting: Surgery

## 2020-02-29 DIAGNOSIS — Z933 Colostomy status: Secondary | ICD-10-CM | POA: Insufficient documentation

## 2020-02-29 DIAGNOSIS — Z7902 Long term (current) use of antithrombotics/antiplatelets: Secondary | ICD-10-CM | POA: Diagnosis not present

## 2020-02-29 DIAGNOSIS — G35 Multiple sclerosis: Secondary | ICD-10-CM | POA: Insufficient documentation

## 2020-02-29 DIAGNOSIS — Z79899 Other long term (current) drug therapy: Secondary | ICD-10-CM | POA: Diagnosis not present

## 2020-02-29 DIAGNOSIS — Z7901 Long term (current) use of anticoagulants: Secondary | ICD-10-CM | POA: Diagnosis not present

## 2020-02-29 DIAGNOSIS — I2510 Atherosclerotic heart disease of native coronary artery without angina pectoris: Secondary | ICD-10-CM | POA: Insufficient documentation

## 2020-02-29 DIAGNOSIS — R7303 Prediabetes: Secondary | ICD-10-CM | POA: Insufficient documentation

## 2020-02-29 DIAGNOSIS — Z01818 Encounter for other preprocedural examination: Secondary | ICD-10-CM | POA: Insufficient documentation

## 2020-02-29 HISTORY — DX: Prediabetes: R73.03

## 2020-02-29 LAB — HEMOGLOBIN A1C
Hgb A1c MFr Bld: 5.6 % (ref 4.8–5.6)
Mean Plasma Glucose: 114.02 mg/dL

## 2020-02-29 LAB — CBC WITH DIFFERENTIAL/PLATELET
Abs Immature Granulocytes: 0.03 10*3/uL (ref 0.00–0.07)
Basophils Absolute: 0 10*3/uL (ref 0.0–0.1)
Basophils Relative: 1 %
Eosinophils Absolute: 0.2 10*3/uL (ref 0.0–0.5)
Eosinophils Relative: 3 %
HCT: 44.8 % (ref 39.0–52.0)
Hemoglobin: 16 g/dL (ref 13.0–17.0)
Immature Granulocytes: 1 %
Lymphocytes Relative: 26 %
Lymphs Abs: 1.6 10*3/uL (ref 0.7–4.0)
MCH: 33.1 pg (ref 26.0–34.0)
MCHC: 35.7 g/dL (ref 30.0–36.0)
MCV: 92.6 fL (ref 80.0–100.0)
Monocytes Absolute: 0.8 10*3/uL (ref 0.1–1.0)
Monocytes Relative: 13 %
Neutro Abs: 3.7 10*3/uL (ref 1.7–7.7)
Neutrophils Relative %: 56 %
Platelets: 197 10*3/uL (ref 150–400)
RBC: 4.84 MIL/uL (ref 4.22–5.81)
RDW: 13.1 % (ref 11.5–15.5)
WBC: 6.4 10*3/uL (ref 4.0–10.5)
nRBC: 0 % (ref 0.0–0.2)

## 2020-02-29 LAB — COMPREHENSIVE METABOLIC PANEL
ALT: 27 U/L (ref 0–44)
AST: 21 U/L (ref 15–41)
Albumin: 4.1 g/dL (ref 3.5–5.0)
Alkaline Phosphatase: 57 U/L (ref 38–126)
Anion gap: 6 (ref 5–15)
BUN: 16 mg/dL (ref 8–23)
CO2: 29 mmol/L (ref 22–32)
Calcium: 9.6 mg/dL (ref 8.9–10.3)
Chloride: 106 mmol/L (ref 98–111)
Creatinine, Ser: 0.85 mg/dL (ref 0.61–1.24)
GFR, Estimated: >60 mL/min (ref >60)
Glucose, Bld: 149 mg/dL — ABNORMAL HIGH (ref 70–99)
Potassium: 4.3 mmol/L (ref 3.5–5.1)
Sodium: 141 mmol/L (ref 135–145)
Total Bilirubin: 0.8 mg/dL (ref 0.3–1.2)
Total Protein: 7.4 g/dL (ref 6.5–8.1)

## 2020-02-29 NOTE — Progress Notes (Addendum)
Anesthesia Review:  PCP:  DR amy [name] LOV 01/19/20  Neuro- LOV 02/09/20 DR Narda Amber-? Peripheral neuropathy, balance issues hx of MS  To have MRI done on 03/15/2020  Cardiologist : DR Jenkins [place] - hx of 2 coronary stents  Clearance in telephone encounter dated 11/24/2019  Per Daleen Snook Koeger,PA  [place] 10/06/2019 with Jonelle Sidle, PA with Cardiology   Chest x-ray : EKG :08/29/2019  Echo : 07/25/2019  Stress test: Cardiac Cath :  Activity level: can do a flight of stairs without difficulty  Sleep Study/ CPAP : Has sleep apnea- no cpap  Per  pt  Fasting Blood Sugar :      / Checks Blood Sugar -- times a day:   Blood Thinner/ Instructions /Last Dose: ASA / Instructions/ Last Dose :  PreDiabetes- pt on no meds  02/29/2020 HGBA1c- 5.6 Plavix- pt had not received preop instructions for Plavix prior to preop visit.  At time of preop visit called office of [facility] and spoke with Triage at [facility] and made aware.  They were on speakerphone with pt at preop visit and told him to hold Plavix 5 days prior to procedure.  PT voiced understanding and wrote down on preop instructions.   Pt had also not received his bowel prep instructions prior to surgery.  Triage at [facility] faxed over copy of bowel prep instructions for pt and the copy was given to him at preop along with a copy being placed on his chart.   Since pt states he is prediabetic pt was instructed to obtain G2 lower sugar Gatorade  X 3 for preop drinks.  Pt written on preop instructions and instructed per physican order to obtain 3 G2 lower sugar drink s instead of Ensure preop drink.  Pt voiced understanding. Pt was instructed ot obtain for the G2 lower sugar drinks either in orange, blue or clear.  PT voiced understanding.

## 2020-03-01 NOTE — Anesthesia Preprocedure Evaluation (Addendum)
Anesthesia Evaluation  Patient identified by MRN, date of birth, ID band Patient awake    Reviewed: Allergy & Precautions, NPO status , Patient's Chart, lab work & pertinent test results  Airway Mallampati: II  TM Distance: >3 FB Neck ROM: Full    Dental no notable dental hx. (+) Teeth Intact, Dental Advisory Given   Pulmonary sleep apnea , former smoker,    Pulmonary exam normal breath sounds clear to auscultation       Cardiovascular Exercise Tolerance: Good hypertension, Pt. on home beta blockers and Pt. on medications + CAD (S/p pci 2001, 2006)  Normal cardiovascular exam Rhythm:Regular Rate:Normal  07/25/19 Echo Left ventricular ejection fraction, by estimation, is 70 to 75%. The left ventricle has hyperdynamic function. The left ventricle has no regional wall motion abnormalities. Left ventricular diastolic parameters are consistent with Grade I diastolic dysfunction (impaired relaxation). 2. Right ventricular systolic function is normal. The right ventri   Neuro/Psych PSYCHIATRIC DISORDERS Pt w MS cites stability problems and feet  Dragging bilateally   Neuromuscular disease    GI/Hepatic Neg liver ROS, GERD  ,  Endo/Other    Renal/GU Renal diseaseLab Results      Component                Value               Date                      WBC                      6.4                 02/29/2020                HGB                      16.0                02/29/2020                HCT                      44.8                02/29/2020                MCV                      92.6                02/29/2020                PLT                      197                 02/29/2020                Musculoskeletal negative musculoskeletal ROS (+)   Abdominal   Peds  Hematology Lab Results      Component                Value               Date  WBC                      6.4                 02/29/2020                 HGB                      16.0                02/29/2020                HCT                      44.8                02/29/2020                MCV                      92.6                02/29/2020                PLT                      197                 02/29/2020              Anesthesia Other Findings   Reproductive/Obstetrics                           Anesthesia Physical Anesthesia Plan  ASA: III  Anesthesia Plan: General   Post-op Pain Management:    Induction: Intravenous  PONV Risk Score and Plan: 3 and Treatment may vary due to age or medical condition, Ondansetron and Dexamethasone  Airway Management Planned: Oral ETT  Additional Equipment: None  Intra-op Plan:   Post-operative Plan: Extubation in OR  Informed Consent: I have reviewed the patients History and Physical, chart, labs and discussed the procedure including the risks, benefits and alternatives for the proposed anesthesia with the patient or authorized representative who has indicated his/her understanding and acceptance.     Dental advisory given  Plan Discussed with: CRNA and Anesthesiologist  Anesthesia Plan Comments: (See PAT note 02/29/2020, Konrad Felix, PA-C  Check last plavix last was 2/18   GA w lidocaine infusion)      Anesthesia Quick Evaluation

## 2020-03-01 NOTE — Progress Notes (Signed)
Anesthesia Chart Review   Case: [id number] Date/Time: 03/07/20 0815   Procedures:      LAPAROSCOPIC VS OPEN COLOSTOMY REVERSAL (N/A )     LYSIS OF ADHESION (N/A )   Anesthesia type: General   Pre-op diagnosis: COLOSTOMY   Location: [facility] ROOM 01 / [facility]   Surgeons: Clovis Riley, MD      DISCUSSION:75 y.o. former smoker with h/o OSA, MS, CAD (s/p remote stenting to RCA in 2001 with subsequent PCI/DES to D2 managed with PCI/DES in 2016), pre-diabetes, colostomy scheduled for above procedure 03/07/20 with Dr. Romana Juniper.   Per cardiology preoperative evaluation 11/24/19, "Chart reviewed as part of pre-operative protocol coverage. Patient was contacted 11/24/2019 in reference to pre-operative risk assessment for pending surgery as outlined below.  ESAW KNIPPEL was last seen on 10/12/19 by Robbie Lis, PA-C.  Since that day, DAEL HOWLAND has done fine from a cardiac standpoint. He can complete 4 METs without anginal complaints. Therefore, based on ACC/AHA guidelines, the patient would be at acceptable risk for the planned procedure without further cardiovascular testing.  The patient was advised that if he develops new symptoms prior to surgery to contact our office to arrange for a follow-up visit, and he verbalized understanding. Per Dr. Johnsie Cancel, patient can hold plavix 5 days prior to his upcoming surgery with plans to restart as soon as he is cleared to do so by his surgeon."  Anticipate pt can proceed with planned procedure barring acute status change.   VS: BP 134/89   Pulse 77   Temp 36.9 C (Oral)   Resp 16   SpO2 98%   PROVIDERS: Jinny Sanders, MD is PCP   Jenkins Rouge, MD is Cardiologist  LABS: Labs reviewed: Acceptable for surgery. (all labs ordered are listed, but only abnormal results are displayed)  Labs Reviewed  COMPREHENSIVE METABOLIC PANEL - Abnormal; Notable for the following components:      Result Value   Glucose, Bld 149 (*)    All other  components within normal limits  CBC WITH DIFFERENTIAL/PLATELET  HEMOGLOBIN A1C     IMAGES:   EKG: 08/29/2019 Rate 86 bpm  Sinus rhythm  Inferior infarct, old  CV: Echo 07/25/19 IMPRESSIONS    1. Left ventricular ejection fraction, by estimation, is 70 to 75%. The  left ventricle has hyperdynamic function. The left ventricle has no  regional wall motion abnormalities. Left ventricular diastolic parameters  are consistent with Grade I diastolic  dysfunction (impaired relaxation).  2. Right ventricular systolic function is normal. The right ventricular  size is normal. There is normal pulmonary artery systolic pressure.  3. The mitral valve is normal in structure. Trivial mitral valve  regurgitation. No evidence of mitral stenosis.  4. The aortic valve is tricuspid. Aortic valve regurgitation is not  visualized. No aortic stenosis is present.  5. The inferior vena cava is normal in size with greater than 50%  respiratory variability, suggesting right atrial pressure of 3 mmHg.   Stress Test 03/26/2016  The patient walked for 6:00 of a Bruce protocol. Peak HR of 157 which is 104% predicted maximal HR .  There were no ST or T wave changes to suggest ischemia.  Defect 1: There is a medium defect of moderate severity present in the basal inferior, basal inferolateral, mid inferior, mid inferolateral and apical inferior location.  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  Nuclear stress EF: 58%. The left ventricular ejection fraction is normal (55-65%).  This  is a low risk study.    Past Medical History:  Diagnosis Date  . Basal cell carcinoma    Bilateral Legs  . CAD (coronary artery disease)    a.  stent to the RCA in 2001;  b.  Negative Myoview in January 2012;  c.  LHC 4/16:  dLM 20, small D1 50-70, D2 sub-total, oLCx 50-70, RCA stent ok, dPLB 90, EF 35-40% >> DES to D2  . Depression    pt denies   . History of echocardiogram    a.   Echo 4/16: Mild LVH, EF 09-64%, grade 1 diastolic dysfunction, normal wall motion, MAC  . HLD (hyperlipidemia)   . HTN (hypertension)   . MS (multiple sclerosis) ([place])   . OSA (obstructive sleep apnea)     no cpap   . Pre-diabetes   . Vertigo     Past Surgical History:  Procedure Laterality Date  . BIOPSY  07/26/2019   Procedure: BIOPSY;  Surgeon: Gatha Mayer, MD;  Location: [place];  Service: Endoscopy;;  . CATARACT EXTRACTION, BILATERAL    . COLON RESECTION SIGMOID N/A 08/30/2019   Procedure: SIGMOID COLON RESECTION;  Surgeon: Clovis Riley, MD;  Location: [place];  Service: General;  Laterality: N/A;  . COLONOSCOPY N/A 07/26/2019   Procedure: COLONOSCOPY;  Surgeon: Gatha Mayer, MD;  Location: [facility] ENDOSCOPY;  Service: Endoscopy;  Laterality: N/A;  . COLONOSCOPY    . COLOSTOMY N/A 08/30/2019   Procedure: COLOSTOMY;  Surgeon: Clovis Riley, MD;  Location: [place];  Service: General;  Laterality: N/A;  . CORONARY STENT PLACEMENT  2001    RCA  . LEFT HEART CATHETERIZATION WITH CORONARY ANGIOGRAM N/A 02/13/2012   Procedure: LEFT HEART CATHETERIZATION WITH CORONARY ANGIOGRAM;  Surgeon: [name], MD;  Location[facility] CATH LAB;  Service: Cardiovascular;  Laterality: N/A;  . LEFT HEART CATHETERIZATION WITH CORONARY ANGIOGRAM N/A 04/24/2014   Procedure: LEFT HEART CATHETERIZATION WITH CORONARY ANGIOGRAM;  Surgeon: [name], MD;  Location: [facility] CATH LAB;  Service: Cardiovascular;  Laterality: N/A;  . TONSILLECTOMY      MEDICATIONS: . acetaminophen (TYLENOL) 325 MG tablet  . carvedilol (COREG) 12.5 MG tablet  . clopidogrel (PLAVIX) 75 MG tablet  . feeding supplement, ENSURE ENLIVE, (ENSURE ENLIVE) LIQD  . fluocinonide (LIDEX) 0.05 % external solution  . hydrocortisone cream 1 %  . Magnesium Oxide 400 MG CAPS  . melatonin 3 MG TABS tablet  . metroNIDAZOLE (FLAGYL) 500 MG tablet  . nitroGLYCERIN (NITROSTAT) 0.4 MG SL tablet  . omeprazole (PRILOSEC) 20 MG capsule  .  triamcinolone (KENALOG) 0.1 %  . venlafaxine XR (EFFEXOR-XR) 37.5 MG 24 hr capsule  . venlafaxine XR (EFFEXOR-XR) 75 MG 24 hr capsule   No current facility-administered medications for this encounter.     Konrad Felix, PA-C [facility] [phone]

## 2020-03-03 ENCOUNTER — Other Ambulatory Visit (HOSPITAL_COMMUNITY)
Admission: RE | Admit: 2020-03-03 | Discharge: 2020-03-03 | Disposition: A | Discharge status: Home / Self Care | Payer: Medicare HMO | Source: Ambulatory Visit | Attending: Surgery | Admitting: Surgery

## 2020-03-03 DIAGNOSIS — Z01812 Encounter for preprocedural laboratory examination: Secondary | ICD-10-CM | POA: Insufficient documentation

## 2020-03-03 DIAGNOSIS — Z20822 Contact with and (suspected) exposure to covid-19: Secondary | ICD-10-CM | POA: Insufficient documentation

## 2020-03-03 LAB — SARS CORONAVIRUS 2 (TAT 6-24 HRS): SARS Coronavirus 2: NEGATIVE

## 2020-03-06 MED ORDER — BUPIVACAINE LIPOSOME 1.3 % IJ SUSP
20.0000 mL | Freq: Once | INTRAMUSCULAR | Status: DC
  Filled 2020-03-06: qty 20

## 2020-03-07 ENCOUNTER — Inpatient Hospital Stay (HOSPITAL_COMMUNITY): Payer: Medicare HMO | Admitting: Certified Registered"

## 2020-03-07 ENCOUNTER — Encounter (HOSPITAL_COMMUNITY): Payer: Self-pay | Admitting: Surgery

## 2020-03-07 ENCOUNTER — Inpatient Hospital Stay (HOSPITAL_COMMUNITY)
Admission: RE | Admit: 2020-03-07 | Discharge: 2020-03-10 | DRG: 337 | Disposition: A | Discharge status: Home / Self Care | Payer: Medicare HMO | Attending: Surgery | Admitting: Surgery

## 2020-03-07 ENCOUNTER — Encounter (HOSPITAL_COMMUNITY)
Admission: RE | Disposition: A | Discharge status: Home / Self Care | Payer: Self-pay | Source: Home / Self Care | Attending: Surgery

## 2020-03-07 ENCOUNTER — Inpatient Hospital Stay (HOSPITAL_COMMUNITY): Payer: Medicare HMO | Admitting: Physician Assistant

## 2020-03-07 ENCOUNTER — Other Ambulatory Visit: Payer: Self-pay

## 2020-03-07 DIAGNOSIS — G4733 Obstructive sleep apnea (adult) (pediatric): Secondary | ICD-10-CM | POA: Diagnosis not present

## 2020-03-07 DIAGNOSIS — Z9049 Acquired absence of other specified parts of digestive tract: Secondary | ICD-10-CM

## 2020-03-07 DIAGNOSIS — K56609 Unspecified intestinal obstruction, unspecified as to partial versus complete obstruction: Secondary | ICD-10-CM | POA: Diagnosis not present

## 2020-03-07 DIAGNOSIS — Z87891 Personal history of nicotine dependence: Secondary | ICD-10-CM

## 2020-03-07 DIAGNOSIS — Z8249 Family history of ischemic heart disease and other diseases of the circulatory system: Secondary | ICD-10-CM | POA: Diagnosis not present

## 2020-03-07 DIAGNOSIS — Z7902 Long term (current) use of antithrombotics/antiplatelets: Secondary | ICD-10-CM | POA: Diagnosis not present

## 2020-03-07 DIAGNOSIS — Z9889 Other specified postprocedural states: Secondary | ICD-10-CM | POA: Diagnosis present

## 2020-03-07 DIAGNOSIS — E785 Hyperlipidemia, unspecified: Secondary | ICD-10-CM | POA: Diagnosis not present

## 2020-03-07 DIAGNOSIS — Z85828 Personal history of other malignant neoplasm of skin: Secondary | ICD-10-CM

## 2020-03-07 DIAGNOSIS — K660 Peritoneal adhesions (postprocedural) (postinfection): Secondary | ICD-10-CM | POA: Diagnosis not present

## 2020-03-07 DIAGNOSIS — K219 Gastro-esophageal reflux disease without esophagitis: Secondary | ICD-10-CM | POA: Diagnosis not present

## 2020-03-07 DIAGNOSIS — Z955 Presence of coronary angioplasty implant and graft: Secondary | ICD-10-CM | POA: Diagnosis not present

## 2020-03-07 DIAGNOSIS — Z8719 Personal history of other diseases of the digestive system: Secondary | ICD-10-CM

## 2020-03-07 DIAGNOSIS — Z79899 Other long term (current) drug therapy: Secondary | ICD-10-CM

## 2020-03-07 DIAGNOSIS — I2510 Atherosclerotic heart disease of native coronary artery without angina pectoris: Secondary | ICD-10-CM | POA: Diagnosis not present

## 2020-03-07 DIAGNOSIS — I10 Essential (primary) hypertension: Secondary | ICD-10-CM | POA: Diagnosis not present

## 2020-03-07 DIAGNOSIS — Z433 Encounter for attention to colostomy: Secondary | ICD-10-CM | POA: Diagnosis not present

## 2020-03-07 DIAGNOSIS — Z800 Family history of malignant neoplasm of digestive organs: Secondary | ICD-10-CM

## 2020-03-07 HISTORY — PX: COLOSTOMY TAKEDOWN: SHX5258

## 2020-03-07 HISTORY — PX: LYSIS OF ADHESION: SHX5961

## 2020-03-07 LAB — GLUCOSE, CAPILLARY: Glucose-Capillary: 169 mg/dL — ABNORMAL HIGH (ref 70–99)

## 2020-03-07 SURGERY — CLOSURE, COLOSTOMY, LAPAROSCOPIC
Anesthesia: General

## 2020-03-07 MED ORDER — LABETALOL HCL 5 MG/ML IV SOLN
INTRAVENOUS | Status: AC
  Filled 2020-03-07: qty 4

## 2020-03-07 MED ORDER — CHLORHEXIDINE GLUCONATE 4 % EX LIQD: 60.0000 mL | Freq: Once | CUTANEOUS | Status: DC

## 2020-03-07 MED ORDER — SACCHAROMYCES BOULARDII 250 MG PO CAPS
250.0000 mg | ORAL_CAPSULE | Freq: Two times a day (BID) | ORAL | Status: DC
  Administered 2020-03-07 – 2020-03-10 (×6): 250 mg via ORAL
  Filled 2020-03-07 (×6): qty 1

## 2020-03-07 MED ORDER — ONDANSETRON HCL 4 MG/2ML IJ SOLN
INTRAMUSCULAR | Status: DC | PRN
  Administered 2020-03-07: 4 mg via INTRAVENOUS

## 2020-03-07 MED ORDER — ROCURONIUM BROMIDE 10 MG/ML (PF) SYRINGE
PREFILLED_SYRINGE | INTRAVENOUS | Status: DC | PRN
  Administered 2020-03-07 (×2): 10 mg via INTRAVENOUS
  Administered 2020-03-07: 50 mg via INTRAVENOUS
  Administered 2020-03-07 (×3): 10 mg via INTRAVENOUS
  Administered 2020-03-07: 20 mg via INTRAVENOUS

## 2020-03-07 MED ORDER — DEXAMETHASONE SODIUM PHOSPHATE 10 MG/ML IJ SOLN
INTRAMUSCULAR | Status: DC | PRN
  Administered 2020-03-07: 4 mg via INTRAVENOUS

## 2020-03-07 MED ORDER — MELATONIN 3 MG PO TABS: 6.0000 mg | ORAL_TABLET | Freq: Every evening | ORAL | Status: DC | PRN

## 2020-03-07 MED ORDER — LABETALOL HCL 5 MG/ML IV SOLN
INTRAVENOUS | Status: DC | PRN
  Administered 2020-03-07 (×3): 2.5 mg via INTRAVENOUS

## 2020-03-07 MED ORDER — ONDANSETRON HCL 4 MG/2ML IJ SOLN: 4.0000 mg | Freq: Four times a day (QID) | INTRAMUSCULAR | Status: DC | PRN

## 2020-03-07 MED ORDER — HYDROMORPHONE HCL 1 MG/ML IJ SOLN: 0.5000 mg | INTRAMUSCULAR | Status: DC | PRN

## 2020-03-07 MED ORDER — DEXAMETHASONE SODIUM PHOSPHATE 10 MG/ML IJ SOLN
INTRAMUSCULAR | Status: AC
  Filled 2020-03-07: qty 1

## 2020-03-07 MED ORDER — KETOROLAC TROMETHAMINE 15 MG/ML IJ SOLN
15.0000 mg | Freq: Four times a day (QID) | INTRAMUSCULAR | Status: DC | PRN
  Administered 2020-03-07 – 2020-03-08 (×2): 15 mg via INTRAVENOUS
  Filled 2020-03-07 (×2): qty 1

## 2020-03-07 MED ORDER — FENTANYL CITRATE (PF) 100 MCG/2ML IJ SOLN
INTRAMUSCULAR | Status: AC
  Filled 2020-03-07: qty 2

## 2020-03-07 MED ORDER — ENSURE PRE-SURGERY PO LIQD
296.0000 mL | Freq: Once | ORAL | Status: DC
  Filled 2020-03-07: qty 296

## 2020-03-07 MED ORDER — PANTOPRAZOLE SODIUM 40 MG PO TBEC
40.0000 mg | DELAYED_RELEASE_TABLET | Freq: Every day | ORAL | Status: DC
  Administered 2020-03-08 – 2020-03-10 (×3): 40 mg via ORAL
  Filled 2020-03-07 (×3): qty 1

## 2020-03-07 MED ORDER — DEXMEDETOMIDINE (PRECEDEX) IN NS 20 MCG/5ML (4 MCG/ML) IV SYRINGE
PREFILLED_SYRINGE | INTRAVENOUS | Status: DC | PRN
  Administered 2020-03-07: 4 ug via INTRAVENOUS

## 2020-03-07 MED ORDER — CARVEDILOL 12.5 MG PO TABS
12.5000 mg | ORAL_TABLET | Freq: Two times a day (BID) | ORAL | Status: DC
  Administered 2020-03-07 – 2020-03-10 (×6): 12.5 mg via ORAL
  Filled 2020-03-07 (×6): qty 1

## 2020-03-07 MED ORDER — ENSURE SURGERY PO LIQD
237.0000 mL | Freq: Two times a day (BID) | ORAL | Status: DC
  Administered 2020-03-08 – 2020-03-10 (×5): 237 mL via ORAL

## 2020-03-07 MED ORDER — ALVIMOPAN 12 MG PO CAPS
12.0000 mg | ORAL_CAPSULE | ORAL | Status: AC
  Administered 2020-03-07: 12 mg via ORAL
  Filled 2020-03-07: qty 1

## 2020-03-07 MED ORDER — ACETAMINOPHEN 500 MG PO TABS
1000.0000 mg | ORAL_TABLET | Freq: Four times a day (QID) | ORAL | Status: DC
  Administered 2020-03-07 – 2020-03-10 (×10): 1000 mg via ORAL
  Filled 2020-03-07 (×11): qty 2

## 2020-03-07 MED ORDER — GABAPENTIN 300 MG PO CAPS
300.0000 mg | ORAL_CAPSULE | Freq: Two times a day (BID) | ORAL | Status: DC
  Administered 2020-03-07 – 2020-03-10 (×6): 300 mg via ORAL
  Filled 2020-03-07 (×6): qty 1

## 2020-03-07 MED ORDER — HYDRALAZINE HCL 20 MG/ML IJ SOLN: 10.0000 mg | INTRAMUSCULAR | Status: DC | PRN

## 2020-03-07 MED ORDER — VENLAFAXINE HCL ER 75 MG PO CP24
75.0000 mg | ORAL_CAPSULE | Freq: Every day | ORAL | Status: DC
  Administered 2020-03-08 – 2020-03-10 (×3): 75 mg via ORAL
  Filled 2020-03-07 (×3): qty 1

## 2020-03-07 MED ORDER — PROPOFOL 10 MG/ML IV BOLUS
INTRAVENOUS | Status: DC | PRN
  Administered 2020-03-07: 130 mg via INTRAVENOUS

## 2020-03-07 MED ORDER — DEXMEDETOMIDINE (PRECEDEX) IN NS 20 MCG/5ML (4 MCG/ML) IV SYRINGE
PREFILLED_SYRINGE | INTRAVENOUS | Status: AC
  Filled 2020-03-07: qty 15

## 2020-03-07 MED ORDER — ACETAMINOPHEN 500 MG PO TABS
1000.0000 mg | ORAL_TABLET | ORAL | Status: AC
  Administered 2020-03-07: 1000 mg via ORAL
  Filled 2020-03-07: qty 2

## 2020-03-07 MED ORDER — ONDANSETRON HCL 4 MG/2ML IJ SOLN
INTRAMUSCULAR | Status: AC
  Filled 2020-03-07: qty 2

## 2020-03-07 MED ORDER — LACTATED RINGERS IV SOLN: INTRAVENOUS | Status: DC

## 2020-03-07 MED ORDER — ENSURE PRE-SURGERY PO LIQD: 592.0000 mL | Freq: Once | ORAL | Status: DC

## 2020-03-07 MED ORDER — PROPOFOL 10 MG/ML IV BOLUS
INTRAVENOUS | Status: AC
  Filled 2020-03-07: qty 20

## 2020-03-07 MED ORDER — SODIUM CHLORIDE 0.9 % IV SOLN
2.0000 g | Freq: Two times a day (BID) | INTRAVENOUS | Status: AC
  Administered 2020-03-07: 2 g via INTRAVENOUS
  Filled 2020-03-07: qty 2

## 2020-03-07 MED ORDER — LIDOCAINE HCL (PF) 2 % IJ SOLN
INTRAMUSCULAR | Status: DC | PRN
  Administered 2020-03-07: 1.5 mg/kg/h

## 2020-03-07 MED ORDER — CEFAZOLIN SODIUM-DEXTROSE 2-4 GM/100ML-% IV SOLN
2.0000 g | INTRAVENOUS | Status: AC
  Administered 2020-03-07: 2 g via INTRAVENOUS
  Filled 2020-03-07: qty 100

## 2020-03-07 MED ORDER — SODIUM CHLORIDE 0.9 % IV SOLN: INTRAVENOUS | Status: DC

## 2020-03-07 MED ORDER — LIDOCAINE HCL 2 % IJ SOLN
INTRAMUSCULAR | Status: AC
  Filled 2020-03-07: qty 20

## 2020-03-07 MED ORDER — BUPIVACAINE-EPINEPHRINE (PF) 0.25% -1:200000 IJ SOLN
INTRAMUSCULAR | Status: AC
  Filled 2020-03-07: qty 30

## 2020-03-07 MED ORDER — ALVIMOPAN 12 MG PO CAPS
12.0000 mg | ORAL_CAPSULE | Freq: Two times a day (BID) | ORAL | Status: DC
  Administered 2020-03-08 – 2020-03-10 (×3): 12 mg via ORAL
  Filled 2020-03-07 (×3): qty 1

## 2020-03-07 MED ORDER — LABETALOL HCL 5 MG/ML IV SOLN
INTRAVENOUS | Status: AC
  Administered 2020-03-07: 2.5 mg via INTRAVENOUS
  Filled 2020-03-07: qty 4

## 2020-03-07 MED ORDER — DIPHENHYDRAMINE HCL 12.5 MG/5ML PO ELIX
12.5000 mg | ORAL_SOLUTION | Freq: Four times a day (QID) | ORAL | Status: DC | PRN
Start: 2020-03-07 — End: 2020-03-10

## 2020-03-07 MED ORDER — METOPROLOL TARTRATE 5 MG/5ML IV SOLN
5.0000 mg | Freq: Four times a day (QID) | INTRAVENOUS | Status: DC | PRN
  Filled 2020-03-07: qty 5

## 2020-03-07 MED ORDER — FENTANYL CITRATE (PF) 250 MCG/5ML IJ SOLN
INTRAMUSCULAR | Status: DC | PRN
  Administered 2020-03-07: 100 ug via INTRAVENOUS
  Administered 2020-03-07 (×3): 50 ug via INTRAVENOUS

## 2020-03-07 MED ORDER — DIPHENHYDRAMINE HCL 50 MG/ML IJ SOLN: 12.5000 mg | Freq: Four times a day (QID) | INTRAMUSCULAR | Status: DC | PRN

## 2020-03-07 MED ORDER — GABAPENTIN 300 MG PO CAPS
300.0000 mg | ORAL_CAPSULE | ORAL | Status: AC
  Administered 2020-03-07: 300 mg via ORAL
  Filled 2020-03-07: qty 1

## 2020-03-07 MED ORDER — ROCURONIUM BROMIDE 10 MG/ML (PF) SYRINGE
PREFILLED_SYRINGE | INTRAVENOUS | Status: AC
  Filled 2020-03-07: qty 10

## 2020-03-07 MED ORDER — ALUM & MAG HYDROXIDE-SIMETH 200-200-20 MG/5ML PO SUSP: 30.0000 mL | Freq: Four times a day (QID) | ORAL | Status: DC | PRN

## 2020-03-07 MED ORDER — SODIUM CHLORIDE 0.9 % IR SOLN
Status: DC | PRN
  Administered 2020-03-07: 1000 mL

## 2020-03-07 MED ORDER — LIDOCAINE 2% (20 MG/ML) 5 ML SYRINGE
INTRAMUSCULAR | Status: DC | PRN
  Administered 2020-03-07: 60 mg via INTRAVENOUS

## 2020-03-07 MED ORDER — LACTATED RINGERS IR SOLN
Status: DC | PRN
  Administered 2020-03-07: 3000 mL

## 2020-03-07 MED ORDER — VENLAFAXINE HCL ER 37.5 MG PO CP24
37.5000 mg | ORAL_CAPSULE | Freq: Every day | ORAL | Status: DC
  Administered 2020-03-08 – 2020-03-10 (×3): 37.5 mg via ORAL
  Filled 2020-03-07 (×3): qty 1

## 2020-03-07 MED ORDER — ENOXAPARIN SODIUM 40 MG/0.4ML ~~LOC~~ SOLN
40.0000 mg | SUBCUTANEOUS | Status: DC
  Administered 2020-03-08 – 2020-03-10 (×3): 40 mg via SUBCUTANEOUS
  Filled 2020-03-07 (×3): qty 0.4

## 2020-03-07 MED ORDER — ONDANSETRON HCL 4 MG PO TABS: 4.0000 mg | ORAL_TABLET | Freq: Four times a day (QID) | ORAL | Status: DC | PRN

## 2020-03-07 MED ORDER — SUGAMMADEX SODIUM 200 MG/2ML IV SOLN
INTRAVENOUS | Status: DC | PRN
  Administered 2020-03-07: 180 mg via INTRAVENOUS

## 2020-03-07 MED ORDER — OXYCODONE HCL 5 MG PO TABS: 5.0000 mg | ORAL_TABLET | ORAL | Status: DC | PRN

## 2020-03-07 MED ORDER — METOCLOPRAMIDE HCL 5 MG/ML IJ SOLN: 10.0000 mg | Freq: Four times a day (QID) | INTRAMUSCULAR | Status: DC | PRN

## 2020-03-07 MED ORDER — LABETALOL HCL 5 MG/ML IV SOLN: 2.5000 mg | Freq: Once | INTRAVENOUS | Status: AC

## 2020-03-07 SURGICAL SUPPLY — 74 items
ADH SKN CLS APL DERMABOND .7 (GAUZE/BANDAGES/DRESSINGS) ×1
APPLIER CLIP 5 13 M/L LIGAMAX5 (MISCELLANEOUS)
APPLIER CLIP ROT 10 11.4 M/L (STAPLE)
APR CLP MED LRG 11.4X10 (STAPLE)
APR CLP MED LRG 5 ANG JAW (MISCELLANEOUS)
BLADE EXTENDED COATED 6.5IN (ELECTRODE) IMPLANT
CABLE HIGH FREQUENCY MONO STRZ (ELECTRODE) ×2 IMPLANT
CELLS DAT CNTRL 66122 CELL SVR (MISCELLANEOUS) IMPLANT
CLIP APPLIE 5 13 M/L LIGAMAX5 (MISCELLANEOUS) IMPLANT
CLIP APPLIE ROT 10 11.4 M/L (STAPLE) IMPLANT
COVER SURGICAL LIGHT HANDLE (MISCELLANEOUS) ×4 IMPLANT
COVER WAND RF STERILE (DRAPES) ×1 IMPLANT
DECANTER SPIKE VIAL GLASS SM (MISCELLANEOUS) ×2 IMPLANT
DERMABOND ADVANCED (GAUZE/BANDAGES/DRESSINGS) ×1
DERMABOND ADVANCED .7 DNX12 (GAUZE/BANDAGES/DRESSINGS) IMPLANT
DRAIN CHANNEL 19F RND (DRAIN) IMPLANT
DRSG OPSITE POSTOP 4X10 (GAUZE/BANDAGES/DRESSINGS) IMPLANT
DRSG OPSITE POSTOP 4X6 (GAUZE/BANDAGES/DRESSINGS) IMPLANT
DRSG OPSITE POSTOP 4X8 (GAUZE/BANDAGES/DRESSINGS) IMPLANT
ELECT REM PT RETURN 15FT ADLT (MISCELLANEOUS) ×2 IMPLANT
ENDOLOOP SUT PDS II  0 18 (SUTURE)
ENDOLOOP SUT PDS II 0 18 (SUTURE) IMPLANT
GAUZE SPONGE 4X4 12PLY STRL (GAUZE/BANDAGES/DRESSINGS) ×1 IMPLANT
GLOVE SURG ENC MOIS LTX SZ6 (GLOVE) ×4 IMPLANT
GLOVE SURG UNDER LTX SZ6.5 (GLOVE) ×4 IMPLANT
GOWN STRL REUS W/TWL LRG LVL3 (GOWN DISPOSABLE) ×4 IMPLANT
GOWN STRL REUS W/TWL XL LVL3 (GOWN DISPOSABLE) ×4 IMPLANT
KIT SIGMOIDOSCOPE (SET/KITS/TRAYS/PACK) ×1 IMPLANT
KIT TURNOVER KIT A (KITS) ×2 IMPLANT
NDL INSUFFLATION 14GA 120MM (NEEDLE) ×1 IMPLANT
NEEDLE INSUFFLATION 14GA 120MM (NEEDLE) ×2 IMPLANT
PACK COLON (CUSTOM PROCEDURE TRAY) ×2 IMPLANT
PAD POSITIONING PINK XL (MISCELLANEOUS) ×1 IMPLANT
PENCIL SMOKE EVACUATOR (MISCELLANEOUS) IMPLANT
PORT LAP GEL ALEXIS MED 5-9CM (MISCELLANEOUS) ×1 IMPLANT
RELOAD STAPLE 60 2.6 WHT THN (STAPLE) IMPLANT
RELOAD STAPLER WHITE 60MM (STAPLE) IMPLANT
RETRACTOR WND ALEXIS 18 MED (MISCELLANEOUS) IMPLANT
RTRCTR WOUND ALEXIS 18CM MED (MISCELLANEOUS)
SCISSORS LAP 5X35 DISP (ENDOMECHANICALS) ×2 IMPLANT
SET IRRIG TUBING LAPAROSCOPIC (IRRIGATION / IRRIGATOR) ×2 IMPLANT
SET TUBE SMOKE EVAC HIGH FLOW (TUBING) ×2 IMPLANT
SHEARS HARMONIC ACE PLUS 36CM (ENDOMECHANICALS) ×2 IMPLANT
SLEEVE XCEL OPT CAN 5 100 (ENDOMECHANICALS) ×4 IMPLANT
SPONGE LAP 18X18 RF (DISPOSABLE) IMPLANT
STAPLER ECHELON LONG 60 440 (INSTRUMENTS) IMPLANT
STAPLER ECHELON POWER CIR 29 (STAPLE) ×1 IMPLANT
STAPLER RELOAD WHITE 60MM (STAPLE)
STAPLER VISISTAT 35W (STAPLE) IMPLANT
SUT NOVA NAB DX-16 0-1 5-0 T12 (SUTURE) ×3 IMPLANT
SUT PDS AB 0 CT1 36 (SUTURE) IMPLANT
SUT PDS AB 1 CTX 36 (SUTURE) IMPLANT
SUT PROLENE 2 0 KS (SUTURE) ×1 IMPLANT
SUT PROLENE 2 0 SH DA (SUTURE) ×1 IMPLANT
SUT SILK 2 0 (SUTURE) ×4
SUT SILK 2 0 SH CR/8 (SUTURE) ×3 IMPLANT
SUT SILK 2-0 18XBRD TIE 12 (SUTURE) ×1 IMPLANT
SUT SILK 3 0 (SUTURE) ×4
SUT SILK 3 0 SH CR/8 (SUTURE) ×3 IMPLANT
SUT SILK 3-0 18XBRD TIE 12 (SUTURE) ×1 IMPLANT
SUT VIC AB 2-0 SH 27 (SUTURE)
SUT VIC AB 2-0 SH 27X BRD (SUTURE) IMPLANT
SUT VIC AB 3-0 CT1 27 (SUTURE) ×4
SUT VIC AB 3-0 CT1 TAPERPNT 27 (SUTURE) IMPLANT
SUT VIC AB 3-0 SH 27 (SUTURE)
SUT VIC AB 3-0 SH 27XBRD (SUTURE) IMPLANT
SYS LAPSCP GELPORT 120MM (MISCELLANEOUS)
SYSTEM LAPSCP GELPORT 120MM (MISCELLANEOUS) IMPLANT
TAPE CLOTH SOFT 2X10 (GAUZE/BANDAGES/DRESSINGS) ×1 IMPLANT
TOWEL OR NON WOVEN STRL DISP B (DISPOSABLE) ×2 IMPLANT
TRAY FOLEY MTR SLVR 16FR STAT (SET/KITS/TRAYS/PACK) ×1 IMPLANT
TROCAR BLADELESS OPT 5 100 (ENDOMECHANICALS) ×2 IMPLANT
TROCAR XCEL 12X100 BLDLESS (ENDOMECHANICALS) IMPLANT
TUBING CONNECTING 10 (TUBING) ×4 IMPLANT

## 2020-03-07 NOTE — Transfer of Care (Signed)
Immediate Anesthesia Transfer of Care Note  Patient: Roy [name]  Procedure(s) Performed: LAPAROSCOPIC VS OPEN COLOSTOMY REVERSAL, RIGID PROCTOSCOPY (N/A ) LYSIS OF ADHESION (N/A )  Patient Location: PACU  Anesthesia Type:General  Level of Consciousness: awake, alert  and patient cooperative  Airway & Oxygen Therapy: Patient Spontanous Breathing and Patient connected to face mask oxygen  Post-op Assessment: Report given to RN and Post -op Vital signs reviewed and stable  Post vital signs: Reviewed and stable  Last Vitals:  Vitals Value Taken Time  BP 156/115 03/07/20 1341  Temp    Pulse 96 03/07/20 1343  Resp 15 03/07/20 1343  SpO2 100 % 03/07/20 1343  Vitals shown include unvalidated device data.  Last Pain:  Vitals:   03/07/20 0658  TempSrc: Oral         Complications: No complications documented.

## 2020-03-07 NOTE — Op Note (Signed)
Operative Note  Roy [name]  [id number]  [id number]  03/07/2020   Surgeon: Clovis Riley MD   Assistant: Ralene Ok MD    Procedure performed: Laparoscopic lysis of adhesions x 60 minutes, laparoscopic mobilization of the splenic flexure, laparoscopic reversal of end colostomy with colorectal anastomosis   Preop diagnosis: end colostomy Post-op diagnosis/intraop findings: same   Specimens: colostomy Retained items: no  EBL: 91TA Complications: none   Description of procedure: After obtaining informed consent the patient was taken to the operating room and placed supine on operating room table where general endotracheal anesthesia was initiated, preoperative antibiotics were administered, SCDs applied, and a formal timeout was performed. The patient was then repositioned in dorsal lithotomy with all pressure points appropriately padded.  A Foley catheter was inserted under sterile conditions.  The abdomen and perineum were then prepped and draped in usual sterile fashion  Peritoneal access was gained using an optical entry in the right upper quadrant.  Insufflation to 15 mmHg without incident.  2 additional 5 mm trochars were placed in the right hemiabdomen.  We proceeded with adhesiolysis using accommodation of blunt, Harmonic scalpel, and cold scissors to clear omental and small bowel adhesions from the anterior abdominal wall as well as from around the stoma.  This was all inspected and confirmed to be hemostatic and free of any bowel injury.  We then turned to the rectal stump identified via PDS suture on the left lateral edge. The rectum was somewhat adherent to the residual colon mesentery.  This was freed up with sharp and blunt dissection. At this point we turned to mobilizing the end colostomy from the abdominal wall which was done with careful blunt and cautery dissection.  Additional omental adhesions to the stoma were taken down with the Harmonic scalpel.  Once freed,  the stoma was sized with dilators and was noted to easily accommodate a 29 mm dilator.  The anvil of the 29 mm EEA was then inserted and secured with a pursestring suture.  The colon was then reduced into the abdomen and an Alexis wound protector with Were applied and the abdomen reinsufflated.  The anvil was not quite able to reach the rectal stump and therefore we proceeded to mobilize the lateral attachments of the descending colon and to mobilize the splenic flexure in a lateral to medial and then superior to inferior fashion.  This allowed the descending colon to reach the rectal stump without tension.  At this point Dr. Rosendo Gros proceeded to break scrub and performed rectal examination anesthesia, removing some amount of inspissated mucus and noted the rectum was ultimately able to accommodate a 29 mm dilator.  The 29 mm stapler was then inserted and the spike brought out through the anterior wall of the rectum just proximal to the old staple line.  The anvil was then connected with this ensuring no twist in the descending colon and the stapler closed down and fired.  A leak test was performed with a rigid sigmoidoscope and the anastomosis was noted to be airtight.  On luminal inspection by Dr. Rosendo Gros the anastomosis appeared viable, well perfused and intact.  The air was allowed to escape.  I did place 2 reinforcing seromuscular 30 Vicryls along the anterior and right anterior aspect of the anastomosis.  The abdomen was reinspected for hemostasis.  The small bowel which had been lysed abdominal wall was reinspected and confirmed to be free of any injury.  The omentum was brought down towards the pelvis.  At this juncture all gowns, gloves, and instruments and drapes were exchanged in accordance with the Eros clean dirty protocol.  The fascia at the stoma site was closed with interrupted figure-of-eight #1 Novafil's.  The skin defect was narrowed somewhat with a 2-0 Vicryl pursestring and the wound  was left open and packed with a 4 x 4 gauze.  The laparoscopic port sites were closed with subcuticular Monocryl and Dermabond. The patient was then awakened, extubated and taken to PACU in stable condition.    All counts were correct at the completion of the case.

## 2020-03-07 NOTE — Anesthesia Procedure Notes (Addendum)
Procedure Name: Intubation Date/Time: 03/07/2020 10:15 AM Performed by: Eben Burow, CRNA Pre-anesthesia Checklist: Patient identified, Emergency Drugs available, Suction available, Patient being monitored and Timeout performed Patient Re-evaluated:Patient Re-evaluated prior to induction Oxygen Delivery Method: Circle system utilized Preoxygenation: Pre-oxygenation with 100% oxygen Induction Type: IV induction Ventilation: Mask ventilation without difficulty Laryngoscope Size: Mac and 4 Grade View: Grade II Tube size: 7.5 mm Number of attempts: 1 Airway Equipment and Method: Stylet Placement Confirmation: ETT inserted through vocal cords under direct vision,  positive ETCO2 and breath sounds checked- equal and bilateral Secured at: 22 cm Tube secured with: Tape Dental Injury: Teeth and Oropharynx as per pre-operative assessment  Comments: Performed by Bernardo Heater, SRNA.

## 2020-03-07 NOTE — Interval H&P Note (Signed)
History and Physical Interval Note:  03/07/2020 7:59 AM  Roy [name]  has presented today for surgery, with the diagnosis of COLOSTOMY.  The various methods of treatment have been discussed with the patient and family. After consideration of risks, benefits and other options for treatment, the patient has consented to  Procedure(s): LAPAROSCOPIC VS OPEN COLOSTOMY REVERSAL (N/A) LYSIS OF ADHESION (N/A) as a surgical intervention.  The patient's history has been reviewed, patient examined, no change in status, stable for surgery.  I have reviewed the patient's chart and labs.  Questions were answered to the patient's satisfaction.     [name]

## 2020-03-08 ENCOUNTER — Encounter (HOSPITAL_COMMUNITY): Payer: Self-pay | Admitting: Surgery

## 2020-03-08 LAB — CBC
HCT: 39.3 % (ref 39.0–52.0)
Hemoglobin: 13.3 g/dL (ref 13.0–17.0)
MCH: 30.8 pg (ref 26.0–34.0)
MCHC: 33.8 g/dL (ref 30.0–36.0)
MCV: 91 fL (ref 80.0–100.0)
Platelets: 142 10*3/uL — ABNORMAL LOW (ref 150–400)
RBC: 4.32 MIL/uL (ref 4.22–5.81)
RDW: 13.2 % (ref 11.5–15.5)
WBC: 10.8 10*3/uL — ABNORMAL HIGH (ref 4.0–10.5)
nRBC: 0 % (ref 0.0–0.2)

## 2020-03-08 LAB — BASIC METABOLIC PANEL
Anion gap: 9 (ref 5–15)
BUN: 13 mg/dL (ref 8–23)
CO2: 26 mmol/L (ref 22–32)
Calcium: 8.3 mg/dL — ABNORMAL LOW (ref 8.9–10.3)
Chloride: 106 mmol/L (ref 98–111)
Creatinine, Ser: 1.22 mg/dL (ref 0.61–1.24)
GFR, Estimated: >60 mL/min (ref >60)
Glucose, Bld: 134 mg/dL — ABNORMAL HIGH (ref 70–99)
Potassium: 4.7 mmol/L (ref 3.5–5.1)
Sodium: 141 mmol/L (ref 135–145)

## 2020-03-08 LAB — MAGNESIUM: Magnesium: 1.4 mg/dL — ABNORMAL LOW (ref 1.7–2.4)

## 2020-03-08 LAB — SURGICAL PATHOLOGY

## 2020-03-08 MED ORDER — MAGNESIUM SULFATE 4 GM/100ML IV SOLN
4.0000 g | Freq: Once | INTRAVENOUS | Status: AC
  Administered 2020-03-08: 4 g via INTRAVENOUS
  Filled 2020-03-08: qty 100

## 2020-03-08 NOTE — Anesthesia Postprocedure Evaluation (Signed)
Anesthesia Post Note  Patient: Roy [name]  Procedure(s) Performed: LAPAROSCOPIC COLOSTOMY REVERSAL, RIGID PROCTOSCOPY (N/A ) LYSIS OF ADHESION (N/A )     Patient location during evaluation: PACU Anesthesia Type: General Level of consciousness: awake and alert Pain management: pain level controlled Vital Signs Assessment: post-procedure vital signs reviewed and stable Respiratory status: spontaneous breathing, nonlabored ventilation, respiratory function stable and patient connected to nasal cannula oxygen Cardiovascular status: blood pressure returned to baseline and stable Postop Assessment: no apparent nausea or vomiting Anesthetic complications: no   No complications documented.  Last Vitals:  Vitals:   03/08/20 0744 03/08/20 0943  BP: 134/81 126/72  Pulse:  91  Resp:  20  Temp:  36.7 C  SpO2:  95%    Last Pain:  Vitals:   03/08/20 0943  TempSrc: Oral  PainSc:                  Barnet Glasgow

## 2020-03-08 NOTE — Evaluation (Signed)
Occupational Therapy Evaluation Patient Details Name: Roy [name] MRN: [id number] DOB: [birth year] Today's Date: 03/08/2020    History of Present Illness Patient is a 75 year old man s/p laparoscopic reversal of end colostomy.   Clinical Impression   Roy [name] presents with ability to perform functional mobility and ADLs near his baseline. He does report pain and discomfort with bending over during ADL tasks. Therapist recommended use of long handled shoe horn and reacher to assist with decreasing discomfort. Patient also reports some back pain and new onset of ambulation dysfunction and unsteadiness - his gait is shuffling and he is flexed at the hips. He is scheduled for an MRI next month. He reports no falls as of yet. Therapist recommended use of RW or having RW close to use if needed .Will defer to PT for further recommendations.     Follow Up Recommendations  No OT follow up    Equipment Recommendations  Other (comment) (Recommended use of shoe horn and LHR)    Recommendations for Other Services       Precautions / Restrictions Precautions Precautions: None Restrictions Weight Bearing Restrictions: No      Mobility Bed Mobility               General bed mobility comments: OOB in recliner    Transfers   Equipment used: None             General transfer comment: Supervision to ambulate in room and perform toilet transfer. No assistance needed. Patient exhibits a mild shuffling gait and slightly bent at the waist.    Balance Overall balance assessment: Mild deficits observed, not formally tested                                         ADL either performed or assessed with clinical judgement   ADL Overall ADL's : Modified independent                                       General ADL Comments: Able to perform ADLs with increased time and some complaints of abdominal pain.     Vision Patient Visual  Report: No change from baseline       Perception     Praxis      Pertinent Vitals/Pain Pain Assessment: 0-10 Pain Score: 3  Pain Location: adomen Pain Descriptors / Indicators: Aching     Hand Dominance Right   Extremity/Trunk Assessment Upper Extremity Assessment Upper Extremity Assessment: Overall WFL for tasks assessed   Lower Extremity Assessment Lower Extremity Assessment: Defer to PT evaluation   Cervical / Trunk Assessment Cervical / Trunk Assessment: Normal   Communication Communication Communication: HOH   Cognition Arousal/Alertness: Awake/alert Behavior During Therapy: WFL for tasks assessed/performed Overall Cognitive Status: Within Functional Limits for tasks assessed                                     General Comments       Exercises     Shoulder Instructions      Home Living Family/patient expects to be discharged to:: Private residence Living Arrangements: Alone Available Help at Discharge: Family;Available PRN/intermittently Type of Home: House Home Access: Stairs to enter Entrance  Stairs-Number of Steps: 6 Entrance Stairs-Rails: Right;Left Home Layout: One level     Bathroom Shower/Tub: Occupational psychologist: Standard     Home Equipment: Environmental consultant - 2 wheels;Shower seat          Prior Functioning/Environment Level of Independence: Independent                 OT Problem List: Pain      OT Treatment/Interventions:      OT Goals(Current goals can be found in the care plan section) Acute Rehab OT Goals OT Goal Formulation: All assessment and education complete, DC therapy  OT Frequency:     Barriers to D/C:            Co-evaluation              AM-PAC OT "6 Clicks" Daily Activity     Outcome Measure Help from another person eating meals?: None Help from another person taking care of personal grooming?: None Help from another person toileting, which includes using toliet, bedpan, or  urinal?: None Help from another person bathing (including washing, rinsing, drying)?: None Help from another person to put on and taking off regular upper body clothing?: None Help from another person to put on and taking off regular lower body clothing?: None 6 Click Score: 24   End of Session Nurse Communication:  (okay to see per RN)  Activity Tolerance: Patient tolerated treatment well Patient left: in chair;with call bell/phone within reach  OT Visit Diagnosis: Pain                Time: 0626-9485 OT Time Calculation (min): 19 min Charges:  OT General Charges $OT Visit: 1 Visit OT Evaluation $OT Eval Low Complexity: 1 Low  Rozlyn Yerby, OTR/L [place]  Office [phone] Pager: [phone]   Lenward Chancellor 03/08/2020, 9:03 AM

## 2020-03-08 NOTE — Progress Notes (Signed)
S: No acute events, had a decent night.  Pain is well controlled.  No nausea or bloating.  Did not really have much to drink, just some sips of water.  Denies any flatus or bowel movement at this point.   O: Vitals, labs, intake/output, and orders reviewed at this time. Afebrile, no tachycardia, normal hypertensive, saturating well on room air.the impingement notable for hypomagnesemia to 1.4, mild increase in creatinine from 0.85 to 1.22.  White count 10.8 (6.4 preop), hemoglobin 13.3 (16), plt 142 (197). By mouth 120, urine output 1730  Gen: A&Ox3, no distress   H&N: EOMI, atraumatic, neck supple Chest: unlabored respirations, RRR Abd: soft, minimally and appropriately tender around stoma site, nondistended, incision(s) c/d/i no cellulitis or hematoma.  Dressing on stoma site is saturated with sensing less fluid, this was changed this morning.  Packing will be removed tomorrow. Ext: warm, no edema Neuro: grossly normal  Lines/tubes/drains: PIV  A/P: POD 1 s/p laparoscopic lysis of adhesions, mobilization of splenic flexure and reversal of end colostomy with colorectal anastomosis -full liquid diet, await return of bowel function -Ambulate as tolerated, PT/OT eval -Continue Lovenox and SCDs -Pulmonary toilet with incentive spirometry -Replace magnesium -DC Toradol with slight increase in creatinine. He received two 15 mg doses.  -repeat labs in a.m.   Romana Juniper, MD [facility], [place]

## 2020-03-08 NOTE — Evaluation (Signed)
Physical Therapy Evaluation Patient Details Name: Roy [name] MRN: [id number] DOB: Oct 06, 1945 Today's Date: 03/08/2020   History of Present Illness  Patient is a 75 year old man s/p laparoscopic reversal of end colostomy on 03/07/20. PMH: OSA, HTN, CAD, vertigo.  Clinical Impression  Pt ambulated 300' (150' with RW, 150' without a device) in the hallway without loss of balance. He reports that over the past few months he's developed some back pain and some difficulty with his feet dragging while walking. He has consulted a neurologist and an MRI of brain and back are pending. No dragging of feet was noted during this session. BLE strength and sensation are WNL. Pt reports remote h/o multiple sclerosis, with most recent exacerbation in the 1980s. From a PT standpoint, he is safe to DC home. No further acute PT indicated as pt is mobilizing independently.  Pt may benefit from outpt PT, depending on results of upcoming MRIs.    Follow Up Recommendations No PT follow up    Equipment Recommendations  None recommended by PT    Recommendations for Other Services       Precautions / Restrictions Precautions Precautions: None Precaution Comments: reports 1 fall in past year, slipped on some steps Restrictions Weight Bearing Restrictions: No      Mobility  Bed Mobility Overal bed mobility: Independent                  Transfers Overall transfer level: Independent Equipment used: None                Ambulation/Gait Ambulation/Gait assistance: Independent Gait Distance (Feet): 300 Feet Assistive device: None;Rolling walker (2 wheeled) Gait Pattern/deviations: WFL(Within Functional Limits) Gait velocity: WNL   General Gait Details: 150' with RW, 150' without RW; no loss of balance with head turns  Science writer    Modified Rankin (Stroke Patients Only)       Balance Overall balance assessment: Mild deficits observed, not  formally tested                                           Pertinent Vitals/Pain Pain Score: 3  Pain Location: adomen Pain Descriptors / Indicators: Aching Pain Intervention(s): Limited activity within patient's tolerance;Monitored during session    Home Living Family/patient expects to be discharged to:: Private residence Living Arrangements: Alone Available Help at Discharge: Family;Available PRN/intermittently Type of Home: House Home Access: Stairs to enter Entrance Stairs-Rails: Psychiatric nurse of Steps: 6 Home Layout: One level Home Equipment: Walker - 2 wheels;Shower seat      Prior Function Level of Independence: Independent               Hand Dominance   Dominant Hand: Right    Extremity/Trunk Assessment   Upper Extremity Assessment Upper Extremity Assessment: Defer to OT evaluation    Lower Extremity Assessment Lower Extremity Assessment: Overall WFL for tasks assessed    Cervical / Trunk Assessment Cervical / Trunk Assessment: Normal  Communication   Communication: HOH  Cognition Arousal/Alertness: Awake/alert Behavior During Therapy: WFL for tasks assessed/performed Overall Cognitive Status: Within Functional Limits for tasks assessed  General Comments      Exercises     Assessment/Plan    PT Assessment Patent does not need any further PT services  PT Problem List         PT Treatment Interventions      PT Goals (Current goals can be found in the Care Plan section)  Acute Rehab PT Goals PT Goal Formulation: All assessment and education complete, DC therapy    Frequency     Barriers to discharge        Co-evaluation               AM-PAC PT "6 Clicks" Mobility  Outcome Measure Help needed turning from your back to your side while in a flat bed without using bedrails?: None Help needed moving from lying on your back to sitting on  the side of a flat bed without using bedrails?: None Help needed moving to and from a bed to a chair (including a wheelchair)?: None Help needed standing up from a chair using your arms (e.g., wheelchair or bedside chair)?: None Help needed to walk in hospital room?: None Help needed climbing 3-5 steps with a railing? : None 6 Click Score: 24    End of Session   Activity Tolerance: Patient tolerated treatment well Patient left: in bed;with call bell/phone within reach Nurse Communication: Mobility status      Time: 7948-0165 PT Time Calculation (min) (ACUTE ONLY): 12 min   Charges:   PT Evaluation $PT Eval Low Complexity: 1 Low         Philomena Doheny PT 03/08/2020  Acute Rehabilitation Services Pager [phone] Office [phone]

## 2020-03-09 ENCOUNTER — Ambulatory Visit: Payer: Self-pay | Admitting: *Deleted

## 2020-03-09 ENCOUNTER — Other Ambulatory Visit (HOSPITAL_COMMUNITY): Payer: Self-pay | Admitting: Surgery

## 2020-03-09 LAB — CBC
HCT: 40.8 % (ref 39.0–52.0)
Hemoglobin: 13.6 g/dL (ref 13.0–17.0)
MCH: 31 pg (ref 26.0–34.0)
MCHC: 33.3 g/dL (ref 30.0–36.0)
MCV: 92.9 fL (ref 80.0–100.0)
Platelets: 141 10*3/uL — ABNORMAL LOW (ref 150–400)
RBC: 4.39 MIL/uL (ref 4.22–5.81)
RDW: 13.2 % (ref 11.5–15.5)
WBC: 8.9 10*3/uL (ref 4.0–10.5)
nRBC: 0 % (ref 0.0–0.2)

## 2020-03-09 LAB — MAGNESIUM: Magnesium: 2 mg/dL (ref 1.7–2.4)

## 2020-03-09 LAB — BASIC METABOLIC PANEL
Anion gap: 10 (ref 5–15)
BUN: 10 mg/dL (ref 8–23)
CO2: 27 mmol/L (ref 22–32)
Calcium: 8.9 mg/dL (ref 8.9–10.3)
Chloride: 105 mmol/L (ref 98–111)
Creatinine, Ser: 1.08 mg/dL (ref 0.61–1.24)
GFR, Estimated: >60 mL/min (ref >60)
Glucose, Bld: 125 mg/dL — ABNORMAL HIGH (ref 70–99)
Potassium: 4.8 mmol/L (ref 3.5–5.1)
Sodium: 142 mmol/L (ref 135–145)

## 2020-03-09 MED ORDER — GABAPENTIN 300 MG PO CAPS: 300.0000 mg | ORAL_CAPSULE | Freq: Two times a day (BID) | ORAL | 0 refills | Status: DC

## 2020-03-09 MED ORDER — OXYCODONE HCL 5 MG PO TABS: 5.0000 mg | ORAL_TABLET | Freq: Four times a day (QID) | ORAL | 0 refills | Status: DC | PRN

## 2020-03-09 MED ORDER — IPRATROPIUM-ALBUTEROL 0.5-2.5 (3) MG/3ML IN SOLN
3.0000 mL | Freq: Once | RESPIRATORY_TRACT | Status: AC
  Administered 2020-03-09: 3 mL via RESPIRATORY_TRACT
  Filled 2020-03-09: qty 3

## 2020-03-09 MED FILL — GABAPENTIN 300 MG CAPSULE: 300 | 5 days supply | Qty: 10 | Fill #0

## 2020-03-09 MED FILL — oxyCODONE HCL 5 MG TABS: 5 | 2 days supply | Qty: 10 | Fill #0

## 2020-03-09 NOTE — Progress Notes (Addendum)
S: No acute issues, pain had been fairly well controlled until early this morning when he reached down beside the bed to pick up his urinal and this incited a fair amount of diffuse abdominal pain.  He has not required any PRN meds in the last 24 hours.  Denies any nausea, not much of an appetite, drank a fair amount of broth yesterday.  He is passing a lot of flatus, had one small smear of stool yesterday which was nonbloody.  Urinating without issue.  Did well with PT and OT.  O: Vitals, labs, intake/output, and orders reviewed at this time. Afebrile, no tachycardia, normo- to hypertensive, saturating well on room air. Labs unremarkable- magnesium, creatinine and WBC have normalized, hemoglobin and platelets stable. PO 1380, urine output 1150+1x, 1 bowel movement recorded  Gen: A&Ox3, no distress   H&N: EOMI, atraumatic, neck supple Chest: unlabored respirations, expiratory wheeze present, RRR Abd: soft, diffusely mildly tender, more so than yesterday.  Mildly distended, more so than yesterday. Incision(s) c/d/i no cellulitis or hematoma.  Dressing on stoma site is saturated with serosanguineous fluid, packing and outer gauze changed this morning.  Ext: warm, no edema Neuro: grossly normal  Lines/tubes/drains: PIV  A/P: POD 2 s/p laparoscopic lysis of adhesions, mobilization of splenic flexure and reversal of end colostomy with colorectal anastomosis -Will advance to soft diet as he is having bowel function but noted to be a little bit more distended this morning -Ambulate as tolerated, appreciate PT/OT consultation -Continue Lovenox and SCDs -Pulmonary toilet with incentive spirometry, will give a DuoNeb this morning -Continue close surgical observation.  Labs and vitals are all within normal limits. If he continues to have bowel function and is able to tolerate a soft diet he may be able to go home over the weekend.   Romana Juniper, MD [facility], [place]

## 2020-03-09 NOTE — Care Management Important Message (Signed)
Important Message  Patient Details IM Letter given to the Patient. Name: Roy [name] MRN: [id number] Date of Birth: 14-Feb-1945   Medicare Important Message Given:  Yes     Kerin Salen 03/09/2020, 12:12 PM

## 2020-03-10 MED ORDER — IPRATROPIUM-ALBUTEROL 0.5-2.5 (3) MG/3ML IN SOLN
3.0000 mL | Freq: Once | RESPIRATORY_TRACT | Status: AC
  Administered 2020-03-10: 3 mL via RESPIRATORY_TRACT
  Filled 2020-03-10: qty 3

## 2020-03-10 NOTE — Progress Notes (Signed)
The patient is alert and oriented and has been seen by his physician. The orders for discharge were written. IVs have been removed. Went over discharge instructions with patient. He is being discharged via wheelchair with all of his belongings.

## 2020-03-10 NOTE — Plan of Care (Signed)
  Problem: Clinical Measurements: Goal: Postoperative complications will be avoided or minimized Outcome: Progressing   Problem: Skin Integrity: Goal: Demonstration of wound healing without infection will improve Outcome: Progressing   Problem: Health Behavior/Discharge Planning: Goal: Ability to manage health-related needs will improve Outcome: Progressing   Problem: Clinical Measurements: Goal: Will remain free from infection Outcome: Progressing   Problem: Activity: Goal: Risk for activity intolerance will decrease Outcome: Progressing   Problem: Nutrition: Goal: Adequate nutrition will be maintained Outcome: Progressing   Problem: Pain Managment: Goal: General experience of comfort will improve Outcome: Progressing   Problem: Skin Integrity: Goal: Risk for impaired skin integrity will decrease Outcome: Progressing

## 2020-03-10 NOTE — Discharge Summary (Signed)
Physician Discharge Summary  Patient ID: Roy [name] MRN: [id number] DOB/AGE: July 03, 1945 75 y.o.  Admit date: 03/07/2020 Discharge date: 03/10/2020  Admission Diagnoses: colostomy  Discharge Diagnoses:  Active Problems:   History of colostomy reversal   Discharged Condition: good  Hospital Course: Pt admitted after surgery.  Diet advanced as tolerated.  By POD 3 he was tolerating a diet and having bowel function.  He was having minimal pain controlled with oral meds.  He was ambulating and urinating without difficulty.  He was felt to be in stable condition for discharge.  Consults: None  Significant Diagnostic Studies: labs: cbc. bmet  Treatments: IV hydration, analgesia: acetaminophen and surgery: colostomy reversal  Discharge Exam: Blood pressure (!) 162/99, pulse 82, temperature (!) 97.5 F (36.4 C), temperature source Oral, resp. rate 16, SpO2 98 %. General appearance: alert and cooperative GI: soft, mildly distended Incision/Wound: clean, dry intact  Disposition: home   Allergies as of 03/10/2020   No Known Allergies     Medication List    STOP taking these medications   metroNIDAZOLE 500 MG tablet Commonly known as: FLAGYL     TAKE these medications   acetaminophen 325 MG tablet Commonly known as: TYLENOL Take 2 tablets (650 mg total) by mouth every 6 (six) hours as needed. What changed: reasons to take this   carvedilol 12.5 MG tablet Commonly known as: COREG TAKE 1 TABLET BY MOUTH TWICE A DAY What changed: when to take this   clopidogrel 75 MG tablet Commonly known as: PLAVIX TAKE 1 TABLET BY MOUTH EVERY DAY   feeding supplement Liqd Take 237 mLs by mouth 2 (two) times daily between meals. What changed: when to take this   fluocinonide 0.05 % external solution Commonly known as: LIDEX Apply 1 application topically at bedtime as needed (psoriasis).   gabapentin 300 MG capsule Commonly known as: NEURONTIN Take 1 capsule (300 mg total)  by mouth 2 (two) times daily for 5 days.   hydrocortisone cream 1 % Apply 1 application topically 2 (two) times daily as needed for itching.   melatonin 3 MG Tabs tablet Take 6 mg by mouth at bedtime as needed (sleep).   nitroGLYCERIN 0.4 MG SL tablet Commonly known as: NITROSTAT Place 1 tablet (0.4 mg total) under the tongue every 5 (five) minutes x 3 doses as needed for chest pain.   omeprazole 20 MG capsule Commonly known as: PRILOSEC TAKE 1 CAPSULE BY MOUTH EVERY DAY What changed: how much to take   oxyCODONE 5 MG immediate release tablet Commonly known as: Oxy IR/ROXICODONE Take 1 tablet (5 mg total) by mouth every 6 (six) hours as needed for severe pain (Pain not relieved by tylenol, ibuprofen, rest or ice.).   triamcinolone 0.1 % Commonly known as: KENALOG Apply 1 application topically 3 (three) times daily as needed (psoriasis).   venlafaxine XR 37.5 MG 24 hr capsule Commonly known as: EFFEXOR-XR TAKE IN ADDITION TO 75 MG TABLETS What changed:   how much to take  how to take this  when to take this  additional instructions   venlafaxine XR 75 MG 24 hr capsule Commonly known as: EFFEXOR-XR TAKE 1 CAPSULE (75 MG TOTAL) BY MOUTH DAILY WITH BREAKFAST. What changed:   when to take this  additional instructions       Follow-up Information    Clovis Riley, MD In 3 weeks.   Specialty: General Surgery Contact information: [address] [place] [address] [phone]  Signed: Rosario Adie 1/53/7943, 7:50 AM

## 2020-03-10 NOTE — Discharge Instructions (Addendum)
ABDOMINAL SURGERY: POST OP INSTRUCTIONS  1. DIET: Follow a light bland diet the first 24 hours after arrival home, such as soup, liquids, crackers, etc.  Be sure to include lots of fluids daily.  Avoid fast food or heavy meals as your are more likely to get nauseated.  Eat a low fat the next few days after surgery.   2. Take your usually prescribed home medications unless otherwise directed. 3. PAIN CONTROL: a. Pain is best controlled by a usual combination of three different methods TOGETHER: i. Ice/Heat ii. Over the counter pain medication iii. Prescription pain medication b. Most patients will experience some swelling and bruising around the incisions.  Ice packs or heating pads (30-60 minutes up to 6 times a day) will help. Use ice for the first few days to help decrease swelling and bruising, then switch to heat to help relax tight/sore spots and speed recovery.  Some people prefer to use ice alone, heat alone, alternating between ice & heat.  Experiment to what works for you.  Swelling and bruising can take several weeks to resolve.   c. It is helpful to take an over-the-counter pain medication regularly for the first few days: i. Naproxen (Aleve, etc)  Two 220mg  tabs twice a day OR Ibuprofen (Advil, etc) Three 200mg  tabs four times a day (every meal & bedtime) AND ii. Acetaminophen (Tylenol, etc) 500-650mg  four times a day (every meal & bedtime) d. A  prescription for pain medication (such as oxycodone, hydrocodone, etc) should be given to you upon discharge.  Take your pain medication as prescribed.  i. If you are having problems/concerns with the prescription medicine (does not control pain, nausea, vomiting, rash, itching, etc), please call us [phone] to see if we need to switch you to a different pain medicine that will work better for you and/or control your side effect better. ii. If you need a refill on your pain medication, please contact your pharmacy.  They will contact our  office to request authorization. Prescriptions will not be filled after 5 pm or on week-ends. 4. Avoid getting constipated.  Between the surgery and the pain medications, it is common to experience some constipation.  Increasing fluid intake and taking a fiber supplement (such as Metamucil, Citrucel, FiberCon, MiraLax, etc) 1-2 times a day regularly will usually help prevent this problem from occurring.  A mild laxative (prune juice, Milk of Magnesia, MiraLax, etc) should be taken according to package directions if there are no bowel movements after 48 hours.   5. Watch out for diarrhea.  If you have many loose bowel movements, simplify your diet to bland foods & liquids for a few days.  Stop any stool softeners and decrease your fiber supplement.  Switching to mild anti-diarrheal medications (Kayopectate, Pepto Bismol) can help.  If this worsens or does not improve, please call us. 6. Wash / shower every day.  You may shower over the incision / wound.  Avoid baths until the skin is fully healed.  Continue to shower over incision(s) after the dressing is off. 7. Glue will flake off after about 2 weeks.  You may leave the incisions open to air.  For the Open wound on your left side, keep a dry gauze bandage over this and change daily and as needed.  8. ACTIVITIES as tolerated:   a. You may resume regular (light) daily activities beginning the next day--such as daily self-care, walking, climbing stairs--gradually increasing activities as tolerated.  If you can walk 30  minutes without difficulty, it is safe to try more intense activity such as jogging, treadmill, bicycling, low-impact aerobics, swimming, etc. Walk for at least 1 hour (cumulative, does not have to be all at once) every day.  b. Save the most intensive and strenuous activity for last such as sit-ups, heavy lifting, contact sports, etc  Refrain from any heavy lifting or straining until 6 weeks after surgery.   c. DO NOT PUSH THROUGH PAIN.  Let  pain be your guide: If it hurts to do something, don't do it.  Pain is your body warning you to avoid that activity for another week until the pain goes down. d. You may drive when you are no longer taking prescription pain medication, you can comfortably wear a seatbelt, and you can safely maneuver your car and apply brakes. e. Dennis Bast may have sexual intercourse when it is comfortable.  9. FOLLOW UP in our office a. Please call [facility] at [phone] to set up an appointment to see your surgeon in the office for a follow-up appointment approximately 1-2 weeks after your surgery. b. Make sure that you call for this appointment the day you arrive home to insure a convenient appointment time. 10. IF YOU HAVE DISABILITY OR FAMILY LEAVE FORMS, BRING THEM TO THE OFFICE FOR PROCESSING.  DO NOT GIVE THEM TO YOUR DOCTOR.   WHEN TO CALL us [phone]: 1. Poor pain control 2. Reactions / problems with new medications (rash/itching, nausea, etc)  3. Fever over 101.5 F (38.5 C) 4. Inability to urinate 5. Nausea and/or vomiting 6. Worsening swelling or bruising 7. Continued bleeding from incision. 8. Increased pain, redness, or drainage from the incision  The clinic staff is available to answer your questions during regular business hours (8:30am-5pm).  Please dont hesitate to call and ask to speak to one of our nurses for clinical concerns.   A surgeon from [facility] is always on call at the hospitals   If you have a medical emergency, go to the nearest emergency room or call 911.    [facility], [place], [place], [place], [place]  [address] ? MAIN: [phone] ? TOLL FREE: [phone] ? FAX [phone] [email or web address]

## 2020-03-15 ENCOUNTER — Other Ambulatory Visit: Payer: Self-pay

## 2020-03-15 ENCOUNTER — Ambulatory Visit
Admission: RE | Admit: 2020-03-15 | Discharge: 2020-03-15 | Disposition: A | Discharge status: Home / Self Care | Payer: Medicare HMO | Source: Ambulatory Visit | Attending: Neurology | Admitting: Neurology

## 2020-03-15 DIAGNOSIS — R292 Abnormal reflex: Secondary | ICD-10-CM

## 2020-03-15 DIAGNOSIS — M48061 Spinal stenosis, lumbar region without neurogenic claudication: Secondary | ICD-10-CM

## 2020-03-17 ENCOUNTER — Other Ambulatory Visit: Payer: Self-pay | Admitting: Family Medicine

## 2020-03-19 ENCOUNTER — Telehealth: Payer: Self-pay

## 2020-03-19 ENCOUNTER — Other Ambulatory Visit: Payer: Self-pay

## 2020-03-19 DIAGNOSIS — M5416 Radiculopathy, lumbar region: Secondary | ICD-10-CM

## 2020-03-19 NOTE — Telephone Encounter (Signed)
Spoke with patient scheduled MWV with nurse and CPE 

## 2020-03-19 NOTE — Telephone Encounter (Signed)
Please see if patient will schedule his MWV with nurse and CPE with Dr. Diona Browner.

## 2020-03-19 NOTE — Telephone Encounter (Signed)
Called patient and informed him of MRI results and recommendations. Patient is ok with PT referral being sent. Patient is aware that referral will be sent to [facility].

## 2020-03-19 NOTE — Telephone Encounter (Signed)
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  Recommend PT for low back strengthening/stretching Dx: lumbar radiculopathy.

## 2020-03-22 ENCOUNTER — Ambulatory Visit (INDEPENDENT_AMBULATORY_CARE_PROVIDER_SITE_OTHER): Payer: Medicare HMO

## 2020-03-22 DIAGNOSIS — I2510 Atherosclerotic heart disease of native coronary artery without angina pectoris: Secondary | ICD-10-CM | POA: Diagnosis not present

## 2020-03-22 DIAGNOSIS — I10 Essential (primary) hypertension: Secondary | ICD-10-CM | POA: Diagnosis not present

## 2020-03-22 DIAGNOSIS — Z9181 History of falling: Secondary | ICD-10-CM

## 2020-03-22 NOTE — Patient Instructions (Signed)
Visit Information: Thank you for taking the time to speak with me today.   PATIENT GOALS: Goals Addressed            This Visit's Progress   . Improve My Heart Health-Coronary Artery Disease   On track    Timeframe:  Long-Range Goal Priority:  High Start Date:  02/24/2020                          Expected End Date:  06/11/2020                    Follow Up Date 04/20/2020   - Watch for signs of a heart attack: chest pain or discomfort, feeling weak, lightheaded or faint, pain or discomfort in the jaw, neck or back, Pain or discomfort in one or both arms or shoulders, Shortness of breath. - Call 911 for severe symptoms -  Continue to follow up with your cardiologist as recommended and report any ongoing or new symptoms to your doctor as soon as possible.  -  Continue to take your medications as prescribed.  -  Continue to monitor your blood pressure at least every 2-3 weeks.  -  Continue to adhere to a heart healthy diet consisting of fruits, vegetables and whole grains - and low in saturated fat, cholesterol, sodium (salt) and added sugar. - Discuss adding exercise to your daily routine with your doctor   Why is this important?    Lifestyle changes are key to improving the blood flow to your heart. Think about the things you can change and set a goal to live healthy.   Remember, when the blood vessels to your heart start to get clogged you may not have any symptoms.   Over time, they can get worse.   Don't ignore the signs, like chest pain, and get help right away.     Notes:     . Patient will report having no falls   On track    Timeframe:  Long-Range Goal Priority:  High Start Date:  02/24/2020                           Expected End Date: 05/23/2020                  Follow Up Date 04/20/2020    - keep your cell phone with me always - Keep walkways clear of clutter and avoid throw rugs or use non slip rugs - use assistive walking device as needed or as advised by your  provider - use a nightlight in the bathroom/ room/ hallway - wear my glasses and/or hearing aid - attend therapy (Discuss need for physical therapy with your provider).  - Follow up with the neurologist as advised.  - Wear secure fitting shoes at all times with ambulation  Why is this important?    Most falls happen when it is hard for you to walk safely. Your balance may be off because of an illness. You may have pain in your knees, hip or other joints.   You may be overly tired or taking medicines that make you sleepy. You may not be able to see or hear clearly.   Falls can lead to broken bones, bruises or other injuries.   There are things you can do to help prevent falling.     Notes:     . Patient will report  returning to his parttime job in 6 months   On track    Timeframe:  Long-Range Goal Priority:  High Start Date:  02/24/2019                           Expected End Date: 08/25/2020                    Follow Up Date: 04/20/2020  - Patient will call provider office for new concerns or questions - talk with your employer about a plan to go back to work  - Follow up with your providers as recommended and discuss return to work strategy    Why is this important?    You might be worried about going back to work after a heart attack.   You may wonder if you will have another heart attack if you do.    You might also think that you can't do what you used to do.   Going back to work is possible.    You, your doctor and employer will need to work together to figure how to make it okay for you.     Notes:     . Track and Manage My Blood Pressure-Hypertension   On track    Timeframe:  Long-Range Goal Priority:  Medium Start Date: 02/24/2020                            Expected End Date:   06/11/2020                    Follow Up Date 04/20/2020   -  Try again to check your blood pressure at least once every 2 weeks and recording results.  -  Continue to take medications as  prescribed - Attends all scheduled provider appointments - Calls provider office for new concerns, questions, or BP outside discussed parameters - Follows a low sodium diet    Why is this important?    You won't feel high blood pressure, but it can still hurt your blood vessels.   High blood pressure can cause heart or kidney problems. It can also cause a stroke.   Making lifestyle changes like losing a little weight or eating less salt will help.   Checking your blood pressure at home and at different times of the day can help to control blood pressure.   If the doctor prescribes medicine remember to take it the way the doctor ordered.   Call the office if you cannot afford the medicine or if there are questions about it.     Notes:      Patient verbalizes understanding of instructions provided today and agrees to view in [place].   The patient has been provided with contact information for the care management team and has been advised to call with any health related questions or concerns.  The care management team will reach out to the patient again over the next 45 days.   Quinn Plowman RN,BSN,CCM RN Case Manager [place]  [phone]

## 2020-03-22 NOTE — Chronic Care Management (AMB) (Signed)
Chronic Care Management   CCM RN Visit Note  03/22/2020 Name: Roy [name] MRN: [id number] DOB: 04/21/1945  Subjective: Roy [name] is a 75 y.o. year old male who is a primary care patient of [name], Roy E, MD. The care management team was consulted for assistance with disease management and care coordination needs.    Engaged with patient by telephone for follow up visit in response to provider referral for case management and/or care coordination services.   Consent to Services:  The patient was given information about Chronic Care Management services, agreed to services, and gave verbal consent prior to initiation of services.  Please see initial visit note for detailed documentation.   Patient agreed to services and verbal consent obtained.   Assessment: Review of patient past medical history, allergies, medications, health status, including review of consultants reports, laboratory and other test data, was performed as part of comprehensive evaluation and provision of chronic care management services.   SDOH (Social Determinants of Health) assessments and interventions performed:    CCM Care Plan  No Known Allergies  Outpatient Encounter Medications as of 03/22/2020  Medication Sig Note  . acetaminophen (TYLENOL) 325 MG tablet Take 2 tablets (650 mg total) by mouth every 6 (six) hours as needed. (Patient taking differently: Take 650 mg by mouth every 6 (six) hours as needed for moderate pain.)   . carvedilol (COREG) 12.5 MG tablet TAKE 1 TABLET BY MOUTH TWICE A DAY (Patient taking differently: Take 12.5 mg by mouth 2 (two) times daily with a meal.)   . clopidogrel (PLAVIX) 75 MG tablet TAKE 1 TABLET BY MOUTH EVERY DAY (Patient taking differently: Take 75 mg by mouth daily.)   . feeding supplement, ENSURE ENLIVE, (ENSURE ENLIVE) LIQD Take 237 mLs by mouth 2 (two) times daily between meals. (Patient taking differently: Take 237 mLs by mouth daily.)   . fluocinonide  (LIDEX) 0.05 % external solution Apply 1 application topically at bedtime as needed (psoriasis).   . gabapentin (NEURONTIN) 300 MG capsule Take 1 capsule (300 mg total) by mouth 2 (two) times daily for 5 days.   . hydrocortisone cream 1 % Apply 1 application topically 2 (two) times daily as needed for itching.   . melatonin 3 MG TABS tablet Take 6 mg by mouth at bedtime as needed (sleep).   . nitroGLYCERIN (NITROSTAT) 0.4 MG SL tablet Place 1 tablet (0.4 mg total) under the tongue every 5 (five) minutes x 3 doses as needed for chest pain.   Marland Kitchen omeprazole (PRILOSEC) 20 MG capsule TAKE 1 CAPSULE BY MOUTH EVERY DAY (Patient taking differently: Take 20 mg by mouth daily.) 02/24/2020: Patient reports he takes as needed  . oxyCODONE (OXY IR/ROXICODONE) 5 MG immediate release tablet Take 1 tablet (5 mg total) by mouth every 6 (six) hours as needed for severe pain (Pain not relieved by tylenol, ibuprofen, rest or ice.).   Marland Kitchen triamcinolone (KENALOG) 0.1 % Apply 1 application topically 3 (three) times daily as needed (psoriasis).   . venlafaxine XR (EFFEXOR-XR) 37.5 MG 24 hr capsule TAKE ONE TABLET DAILY IN ADDITION TO 75 MG TABLETS   . venlafaxine XR (EFFEXOR-XR) 75 MG 24 hr capsule TAKE 1 CAPSULE (75 MG TOTAL) BY MOUTH DAILY WITH BREAKFAST. (Patient taking differently: Take 75 mg by mouth daily. Take with 37.5 mg to equal 112.5 mg daily)    No facility-administered encounter medications on file as of 03/22/2020.    Patient Active Problem List   Diagnosis Date Noted  .  History of colostomy reversal 03/07/2020  . Chest pressure 09/26/2019  . Controlled type 2 diabetes mellitus with hyperglycemia ([place]) 07/23/2019  . Insomnia 07/23/2019  . Hypotension 07/01/2019  . Personal history of multiple sclerosis ([place]) 12/02/2018  . Weakness 03/31/2017  . Internal hemorrhoids with complication 74/25/9563  . CAD, RCA PCI 2001, urgent Dx2 DES 04/24/14 04/24/2014  . Unstable angina with ST elelvation and NSVT while on  treadmill 04/24/14   . History of basal cell carcinoma 03/07/2014  . GERD (gastroesophageal reflux disease) 03/07/2014  . Incomplete emptying of bladder 03/07/2014  . Obstructive sleep apnea 08/20/2009  . Dyslipidemia 11/09/2008  . Depression, major, recurrent ([place]) 07/06/2007  . Essential hypertension 07/06/2007  . Coronary artery disease involving native coronary artery of native heart without angina pectoris 07/06/2007    Conditions to be addressed/monitored:CAD, HTN and falls  Care Plan : Fall Risk (Adult)  Updates made by Dannielle Karvonen, RN since 03/22/2020 12:00 AM  Problem: At risk for falls   Priority: High  Long-Range Goal: Absence of Fall and Fall-Related Injury   Start Date: 02/24/2020  Expected End Date: 05/23/2020  This Visit's Progress: On track  Recent Progress: On track  Priority: High  Current Barriers:  Marland Kitchen Knowledge Deficits related to fall precautions in patient with symptoms of balance issues: Patient reports he is schedule to have a brain scan on 04/05/2020 due to his balance issues. Patient reports his recent MRI of his back indicated arthritis.  He reports he will start physical therapy on 03/28/2020.  Clinical Goal(s):  Marland Kitchen Patient will demonstrate improved adherence to prescribed treatment plan for decreasing falls  . Patient will not experience additional falls . Patient will verbalize understanding of plan for fall precautions:  . Patient will attend all scheduled medical appointments: Follow up with the neurologist as scheduled. Patient confirms follow up with neurologist, Dr. Posey Pronto is 05/14/20  . Interventions:  . Collaboration with Jinny Sanders, MD regarding development and update of comprehensive plan of care as evidenced by provider attestation and co-signature . Inter-disciplinary care team collaboration (see longitudinal plan of care) . Provided verbal education re: Potential causes of falls and Fall prevention strategies . Assessed for falls since last  encounter. Patient denies any falls since last telephone outreach with [facility]. Marland Kitchen Assessed patients knowledge of fall risk prevention:  Patient reports having hand rails for steps outside of his home. He reports the necessary fall precautions have been taking inside of his home.  Patient Goals/Self-Care Activities Patient will:   - keep your cell phone with me always - Keep walkways clear of clutter and avoid throw rugs or use non slip rugs - use assistive walking device as needed or as advised by your provider - use a nightlight in the bathroom/ room/ hallway - wear my glasses and/or hearing aid - attend therapy (Discuss need for physical therapy with your provider).  - Follow up with the neurologist as advised.  - Wear secure fitting shoes at all times with ambulation  Follow Up Plan: The patient has been provided with contact information for the care management team and has been advised to call with any health related questions or concerns.  The care management team will reach out to the patient again over the next 30 days.    Problem: Decrease quality of life   Priority: High  Long-Range Goal: Development of long term plan for return to work   Start Date: 03/21/2020  Expected End Date: 07/12/2020  This Visit's Progress:  On track  Priority: Medium  Current Barriers . Knowledge deficit related to long term plan for return to work: Patient states he is scheduled to have a brain scan on 04/05/2020 due to balance issues. He reports having recent MRI of his back which indicated arthritis. Patient states he is scheduled to start physical therapy on 03/28/2020. Clinical Goal(s):  Marland Kitchen Collaboration with Jinny Sanders, MD regarding development and update of comprehensive plan of care as evidenced by provider attestation and co-signature . Inter-disciplinary care team collaboration (see longitudinal plan of care)  patient will work with care management team to address care coordination and chronic  disease management needs  Allow patient opportunity to express thoughts about present/future circumstances Interventions:   Evaluation of current treatment plan related to self-management and patient's adherence to plan as established by provider.  Collaboration with Jinny Sanders, MD regarding development and update of comprehensive plan of care as evidenced by provider attestation       and co-signature  Inter-disciplinary care team collaboration (see longitudinal plan of care)  Discussed plans with patient for ongoing care management follow up and provided patient with direct contact information for care management team  Advised client to discuss return to work plan with provider and employer. Patient [place] Activities:  - Patient will call provider office for new concerns or questions - talk with your employer about a plan to go back to work  - Follow up with your providers as recommended and discuss return to work strategy Follow Up Plan: The patient has been provided with contact information for the care management team and has been advised to call with any health related questions or concerns.  The care management team will reach out to the patient again over the next 45 days.     Care Plan : Coronary Artery Disease (Adult)  Updates made by Dannielle Karvonen, RN since 03/22/2020 12:00 AM  Problem: Knowledge deficit related to coronary artery disease management   Priority: High  Onset Date: 02/24/2020  Long-Range Goal: Disease Progression Prevented or Minimized   Start Date: 02/24/2020  Expected End Date: 06/11/2020  This Visit's Progress: On track  Recent Progress: On track  Priority: High  Current Barriers:  Marland Kitchen Knowledge deficit related to self management of coronary artery disease:  Patient states he has not checked his blood pressure this week but states he normally checks his blood pressure a few times per month.  Clinical Goal(s) related to coronary artery disease     patient will:  . Work with the care management team to address educational, disease management, and care coordination needs  . Begin or continue self health monitoring activities as directed: Measure and record blood pressure at least 2-3 times per week.  . Call provider office for new or worsened signs and symptoms: Chest pain, shortness of breath, weakness, light-headedness, nausea or cold sweat.  . Call care management team with questions or concerns . Keep follow up appointments with your providers: Per chart review, patients next follow up appointment with cardiologist is 04/13/2020.  . Eat a heart healthy diet consisting of fruits, vegetables and whole grains - and low in saturated fat, cholesterol, sodium (salt) and added sugar.  Interventions related to coronary artery disease:  . Evaluation of current treatment plans and patient's adherence to plan as established by provider . Provided disease specific education to patient  . Collaborated with appropriate clinical care team members regarding patient needs Patient Self Care Activities related to coronary  artery disease:  - Watch for signs of a heart attack: chest pain or discomfort, feeling weak, lightheaded or faint, pain or discomfort in the jaw, neck or back, Pain or discomfort in one or both arms or shoulders, Shortness of breath. - Call 911 for severe symptoms -  Continue to follow up with your cardiologist as recommended and report any ongoing or new symptoms to your doctor as soon as possible.  -  Continue to take your medications as prescribed.  -  Continue to monitor your blood pressure at least every 2-3 weeks.  -  Continue to adhere to a heart healthy diet consisting of fruits, vegetables and whole grains - and low in saturated fat, cholesterol, sodium (salt) and added sugar. - Discuss adding exercise to your daily routine with your doctor    Care Plan : Hypertension (Adult)  Updates made by Dannielle Karvonen, RN since  03/22/2020 12:00 AM  Problem: Hypertension (Hypertension)   Priority: High  Long-Range Goal: Patient will verbalize blood pressure managed without hypertensive episodes.   Start Date: 02/24/2020  Expected End Date: 06/11/2020  This Visit's Progress: On track  Recent Progress: On track  Priority: High  Objective:  . Last practice recorded BP readings:  BP Readings from Last 3 Encounters:  02/09/20 (!) 145/93  01/19/20 122/84  10/06/19 120/78   Current Barriers:  Marland Kitchen Knowledge Deficits related to basic understanding of hypertension self care management:  Patient reports he did not check his blood pressure this week. He reports he normally checks his blood pressure a few times per month.  Case Manager Clinical Goal(s):  . patient will attend all scheduled medical appointments: Per chart review, patient has follow up appointment scheduled with the cardiologist on 04/13/2020 and a annual wellness visit scheduled with his primary care provider on 05/08/2020.  . patient will demonstrate improved adherence to prescribed treatment plan for hypertension as evidenced by taking all medications as prescribed, monitoring and recording blood pressure as directed, adhering to low sodium diet Interventions:  . Collaboration with Jinny Sanders, MD regarding development and update of comprehensive plan of care as evidenced by provider attestation and co-signature . Inter-disciplinary care team collaboration (see longitudinal plan of care) . Reviewed medications with patient and discussed importance of compliance . Discussed plans with patient for ongoing care management follow up and provided patient with direct contact information for care management team . Advised patient, providing education and rationale, to monitor blood pressure daily and record, calling PCP for findings outside established parameters.  . Verified patient has blood pressure monitor at home and verbalizes ability to use it.  Patient  Goals/Self-Care Activities -  Try again to check your blood pressure at least once every 2 weeks and recording results.  -  Continue to take medications as prescribed - Attends all scheduled provider appointments - Calls provider office for new concerns, questions, or BP outside discussed parameters - Follows a low sodium diet Follow Up Plan: The patient has been provided with contact information for the care management team and has been advised to call with any health related questions or concerns.  The care management team will reach out to the patient again over the next 30 days.       Plan:The patient has been provided with contact information for the care management team and has been advised to call with any health related questions or concerns.  and The care management team will reach out to the patient again over the next  45 days.   Quinn Plowman RN,BSN,CCM RN Case Manager [place]  [phone]

## 2020-03-26 DIAGNOSIS — C44719 Basal cell carcinoma of skin of left lower limb, including hip: Secondary | ICD-10-CM | POA: Diagnosis not present

## 2020-03-26 DIAGNOSIS — Z85828 Personal history of other malignant neoplasm of skin: Secondary | ICD-10-CM | POA: Diagnosis not present

## 2020-03-26 DIAGNOSIS — C44712 Basal cell carcinoma of skin of right lower limb, including hip: Secondary | ICD-10-CM | POA: Diagnosis not present

## 2020-04-03 NOTE — Progress Notes (Signed)
Cardiology Office Note    Date:  04/13/2020   ID:  Roy [name] 04/09/1945, MRN [id number]  PCP:  Jinny Sanders, MD  Cardiologist:  Dr. Johnsie Cancel  Chief Complaint: hospital follow up  History of Present Illness:   Roy [name] is a 75 y.o. 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  Last Myoview in 2018 was nonischemic.  Admitted summer 2021 with large bowel obstruction with diverticulitis..  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  No chest pain Had colostomy reversal with Dr Kae Heller 03/07/20 with no cardiac complications and held plavix  Has some spinal stenosis by MR 03/15/20 seeing Dr Posey Pronto and getting PT   Past Medical History:  Diagnosis Date  . Basal cell carcinoma    Bilateral Legs  . CAD (coronary artery disease)    a.  stent to the RCA in 2001;  b.  Negative Myoview in January 2012;  c.  LHC 4/16:  dLM 20, small D1 50-70, D2 sub-total, oLCx 50-70, RCA stent ok, dPLB 90, EF 35-40% >> DES to D2  . Depression    pt denies   . History of echocardiogram    a.  Echo 4/16: Mild LVH, EF 79-48%, grade 1 diastolic dysfunction, normal wall motion, MAC  . HLD (hyperlipidemia)   . HTN (hypertension)   . MS (multiple sclerosis) ([facility])   . OSA (obstructive sleep apnea)     no cpap   . Pre-diabetes   . Vertigo     Past Surgical History:  Procedure Laterality Date  . BIOPSY  07/26/2019   Procedure: BIOPSY;  Surgeon: Gatha Mayer, MD;  Location: [place];  Service: Endoscopy;;  . CATARACT EXTRACTION, BILATERAL    . COLON RESECTION SIGMOID N/A 08/30/2019   Procedure: SIGMOID COLON RESECTION;  Surgeon: Clovis Riley, MD;  Location: [place];  Service: General;  Laterality: N/A;  . COLONOSCOPY N/A  07/26/2019   Procedure: COLONOSCOPY;  Surgeon: Gatha Mayer, MD;  Location: [facility] ENDOSCOPY;  Service: Endoscopy;  Laterality: N/A;  . COLONOSCOPY    . COLOSTOMY N/A 08/30/2019   Procedure: COLOSTOMY;  Surgeon: Clovis Riley, MD;  Location: [place];  Service: General;  Laterality: N/A;  . COLOSTOMY TAKEDOWN N/A 03/07/2020   Procedure: LAPAROSCOPIC COLOSTOMY REVERSAL, RIGID PROCTOSCOPY;  Surgeon: Clovis Riley, MD;  Location: [facility];  Service: General;  Laterality: N/A;  . CORONARY STENT PLACEMENT  2001    RCA  . LEFT HEART CATHETERIZATION WITH CORONARY ANGIOGRAM N/A 02/13/2012   Procedure: LEFT HEART CATHETERIZATION WITH CORONARY ANGIOGRAM;  Surgeon: Fatoumata Albaugh M Martinique, MD;  [facility] C[facility] LAB;  Service: Cardiovascular;  Laterality: N/A;  . LEFT HEART CATHETERIZATION WITH CORONARY ANGIOGRAM N/A 04/24/2014   Procedure: LEFT HEART CATHETERIZATION WITH CORONARY ANGIOGRAM;  Surgeon: [name], MD;  Location: [facility] CATH LAB;  Service: Cardiovascular;  Laterality: N/A;  . LYSIS OF ADHESION N/A 03/07/2020   Procedure: LYSIS OF ADHESION;  Surgeon: Clovis Riley, MD;  Location: [facility];  Service: General;  Laterality: N/A;  . TONSILLECTOMY      Current Medications: Prior to Admission medications   Medication Sig Start Date End Date Taking? Authorizing Provider  acetaminophen (TYLENOL) 325 MG tablet Take 2  tablets (650 mg total) by mouth every 6 (six) hours as needed. 09/06/19   Maczis, Barth Kirks, PA-C  carvedilol (COREG) 12.5 MG tablet TAKE 1 TABLET BY MOUTH TWICE A DAY 09/15/19   Josue Hector, MD  clopidogrel (PLAVIX) 75 MG tablet TAKE 1 TABLET BY MOUTH EVERY DAY Patient taking differently: Take 75 mg by mouth daily.  10/12/18   Josue Hector, MD  dicyclomine (BENTYL) 20 MG tablet Take 1 tablet (20 mg total) by mouth every 6 (six) hours as needed (abdominal pain). 07/28/19   Aline August, MD  feeding supplement, ENSURE ENLIVE, (ENSURE ENLIVE) LIQD Take 237 mLs by mouth 2 (two) times daily  between meals. 09/06/19   Darliss Cheney, MD  loperamide (IMODIUM) 2 MG capsule Take 1 capsule (2 mg total) by mouth 2 (two) times daily. 09/06/19   Maczis, Barth Kirks, PA-C  Magnesium Oxide 400 MG CAPS Take 1 capsule (400 mg total) by mouth daily. 08/15/19   Shelly Coss, MD  mesalamine (LIALDA) 1.2 g EC tablet Take 4 tablets (4.8 g total) by mouth daily with breakfast. 08/16/19   Shelly Coss, MD  nitroGLYCERIN (NITROSTAT) 0.4 MG SL tablet Place 1 tablet (0.4 mg total) under the tongue every 5 (five) minutes x 3 doses as needed for chest pain. 07/28/19   Aline August, MD  Nutritional Supplements (,FEEDING SUPPLEMENT, PROSOURCE PLUS) liquid Take 30 mLs by mouth at bedtime. 09/06/19 10/06/19  Darliss Cheney, MD  omeprazole (PRILOSEC) 20 MG capsule TAKE 1 CAPSULE BY MOUTH EVERY DAY 09/15/19   Josue Hector, MD  ondansetron (ZOFRAN) 4 MG tablet Take 1 tablet (4 mg total) by mouth every 6 (six) hours as needed for nausea. 09/06/19   Maczis, Barth Kirks, PA-C  oxyCODONE (OXY IR/ROXICODONE) 5 MG immediate release tablet Take 1 tablet (5 mg total) by mouth every 6 (six) hours as needed for severe pain or breakthrough pain. 09/06/19   Maczis, Barth Kirks, PA-C  potassium chloride 20 MEQ TBCR Take 20 mEq by mouth daily. Take 2 pills (40 mEq) for a week then continue taking 20 mEq daily.Check potassium  level in a week Patient taking differently: Take 20 mEq by mouth daily. 20 mEq, Oral, Daily, Take 2 pills (40 mEq) for a week then continue taking 20 mEq daily.Check potassium  level in a week 08/15/19   Shelly Coss, MD  venlafaxine XR (EFFEXOR-XR) 37.5 MG 24 hr capsule TAKE IN ADDITION TO 75 MG TABLETS 09/20/19   [name], [name], MD  venlafaxine XR (EFFEXOR-XR) 75 MG 24 hr capsule TAKE 1 CAPSULE (75 MG TOTAL) BY MOUTH DAILY WITH BREAKFAST. 06/20/19   [name], [name], MD    Allergies:   Patient has no known allergies.   Social History   Socioeconomic History  . Marital status: Divorced    Spouse name: Not on file  .  Number of children: Not on file  . Years of education: Not on file  . Highest education level: Not on file  Occupational History  . Not on file  Tobacco Use  . Smoking status: Former Smoker    Packs/day: 1.00    Years: 25.00    Pack years: 25.00    Types: Cigarettes    Quit date: 01/14/2000    Years since quitting: 20.2  . Smokeless tobacco: Never Used  Vaping Use  . Vaping Use: Never used  Substance and Sexual Activity  . Alcohol use: Yes    Alcohol/week: 3.0 standard drinks    Types: 1  Cans of beer, 2 Shots of liquor per week    Comment: 4-5 drinks per week   . Drug use: No  . Sexual activity: Not Currently  Other Topics Concern  . 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   Right handed   Drinks caffeine   Single story home   Social Determinants of Health   Financial Resource Strain: Not on file  Food Insecurity: No Food Insecurity  . Worried About Charity fundraiser in the Last Year: Never true  . Ran Out of Food in the Last Year: Never true  Transportation Needs: No Transportation Needs  . Lack of Transportation (Medical): No  . Lack of Transportation (Non-Medical): No  Physical Activity: Not on file  Stress: Not on file  Social Connections: Unknown  . Frequency of Communication with Friends and Family: Not on file  . Frequency of Social Gatherings with Friends and Family: Twice a week  . Attends Religious Services: Never  . Active Member of Clubs or Organizations: No  . Attends Archivist Meetings: Never  . Marital Status: Divorced     Family History:  The patient's family history includes Alcohol abuse in his mother; Cirrhosis in his mother; Coronary artery disease in his father; Depression in his sister; Heart disease in his father; Hypertension in his father; Pancreatic cancer in his mother; Peripheral vascular disease in his father.   ROS:    Please see the history of present illness.    ROS All other systems reviewed and are negative.   PHYSICAL EXAM:   VS:  BP 140/82   Pulse 85   Ht _0  (1.676 m)   Wt 84.4 kg   SpO2 98%   BMI 30.02 kg/m    Affect appropriate Healthy:  appears stated age 23: normal Neck supple with no adenopathy JVP normal no bruits no thyromegaly Lungs clear with no wheezing and good diaphragmatic motion Heart:  S1/S2 no murmur, no rub, gallop or click PMI normal Abdomen: benighn, BS positve, no tenderness, no AAA no bruit.  No HSM or HJR Distal pulses intact with no bruits No edema Neuro non-focal Skin warm and dry No muscular weakness   Wt Readings from Last 3 Encounters:  04/13/20 84.4 kg  02/09/20 85.3 kg  01/19/20 85.4 kg      Studies/Labs Reviewed:   EKG:  SR rate 86 Q lead 2 08/28/19   Recent Labs: 07/26/2019: B Natriuretic Peptide 95.9 02/29/2020: ALT 27 03/09/2020: BUN 10; Creatinine, Ser 1.08; Hemoglobin 13.6; Magnesium 2.0; Platelets 141; Potassium 4.8; Sodium 142   Lipid Panel    Component Value Date/Time   CHOL 198 11/08/2018 0904   CHOL 174 03/26/2016 0736   TRIG 120 07/26/2019 0226   TRIG 130 12/08/2005 0741   HDL 57.30 11/08/2018 0904   HDL 40 03/26/2016 0736   CHOLHDL 3 11/08/2018 0904   VLDL 59.0 (H) 11/08/2018 0904   LDLCALC 65 03/26/2016 0736   LDLDIRECT 97.0 11/08/2018 0904    Additional studies/ records that were reviewed today include:   Echocardiogram: 07/2019 1. Left ventricular ejection fraction, by estimation, is 70 to 75%. The  left ventricle has hyperdynamic function. The left ventricle has no  regional wall motion abnormalities. Left ventricular diastolic parameters  are consistent with Grade I diastolic  dysfunction (impaired relaxation).  2. Right ventricular systolic function is normal.  The right ventricular  size is normal. There is normal pulmonary artery systolic pressure.  3. The mitral valve is normal in structure. Trivial  mitral valve  regurgitation. No evidence of mitral stenosis.  4. The aortic valve is tricuspid. Aortic valve regurgitation is not  visualized. No aortic stenosis is present.  5. The inferior vena cava is normal in size with greater than 50%  respiratory variability, suggesting right atrial pressure of 3 mmHg.      ASSESSMENT & PLAN:    1. CAD  - no angina continue beta blocker and plavix   2. HLD - supposed to be on statin no recent labs in Epic  F/u primary   3. s/p colectomy with colostomy and reversal  -  03/07/20 f/u Dr Kae Heller improved   4. HTN - Well controlled.  Continue current medications and low sodium Dash type diet.     F/U in a year   Signed, Jenkins Rouge, MD  04/13/2020 10:08 AM    [facility] [address], [place], [place]  [address] Phone: [phone]; Fax: [phone]

## 2020-04-05 ENCOUNTER — Ambulatory Visit
Admission: RE | Admit: 2020-04-05 | Discharge: 2020-04-05 | Disposition: A | Discharge status: Home / Self Care | Payer: Medicare HMO | Source: Ambulatory Visit | Attending: Neurology | Admitting: Neurology

## 2020-04-05 ENCOUNTER — Other Ambulatory Visit: Payer: Self-pay

## 2020-04-05 DIAGNOSIS — G319 Degenerative disease of nervous system, unspecified: Secondary | ICD-10-CM | POA: Diagnosis not present

## 2020-04-05 DIAGNOSIS — R2681 Unsteadiness on feet: Secondary | ICD-10-CM

## 2020-04-05 DIAGNOSIS — G35 Multiple sclerosis: Secondary | ICD-10-CM | POA: Diagnosis not present

## 2020-04-05 DIAGNOSIS — R202 Paresthesia of skin: Secondary | ICD-10-CM

## 2020-04-05 DIAGNOSIS — R292 Abnormal reflex: Secondary | ICD-10-CM

## 2020-04-05 MED ORDER — GADOBENATE DIMEGLUMINE 529 MG/ML IV SOLN
17.0000 mL | Freq: Once | INTRAVENOUS | Status: AC | PRN
  Administered 2020-04-05: 17 mL via INTRAVENOUS

## 2020-04-10 ENCOUNTER — Ambulatory Visit: Payer: Medicare HMO | Admitting: Neurology

## 2020-04-10 ENCOUNTER — Other Ambulatory Visit: Payer: Self-pay

## 2020-04-10 ENCOUNTER — Telehealth: Payer: Self-pay

## 2020-04-10 DIAGNOSIS — R292 Abnormal reflex: Secondary | ICD-10-CM

## 2020-04-10 DIAGNOSIS — R2681 Unsteadiness on feet: Secondary | ICD-10-CM

## 2020-04-10 DIAGNOSIS — G35 Multiple sclerosis: Secondary | ICD-10-CM | POA: Diagnosis not present

## 2020-04-10 DIAGNOSIS — R202 Paresthesia of skin: Secondary | ICD-10-CM

## 2020-04-10 NOTE — Telephone Encounter (Signed)
Called patient and left a message for a call back.  

## 2020-04-10 NOTE — Procedures (Signed)
[facility]  [place], [address]  [place], [place] [address] Tel: [phone] Fax:  [phone] Test Date:  04/10/2020  Patient: Roy [name] DOB: 06-28-1945 Physician: Narda Amber, DO  Sex: Male Height: 5\' 6"  Ref Phys: Narda Amber, DO  ID#: [id number]   Technician:    Patient Complaints: This is a 75 year old man referred for evaluation of gait unsteadiness.  NCV & EMG Findings: Extensive electrodiagnostic testing of the right lower extremity functional status of the left shows:  1. Bilateral sural and superficial peroneal sensory responses are within normal limits. 2. Bilateral peroneal and tibial motor responses are within normal limits. 3. Bilateral tibial H reflex studies are within normal limits. 4. Chronic motor axonal loss changes are seen affecting the L5 myotome bilaterally.  There is no evidence of accompanying active denervation.  Impression: 1. Chronic L5 radiculopathy affecting bilateral lower extremities, mild. 2. There is no evidence of a sensorimotor polyneuropathy affecting the lower extremities   ___________________________ Narda Amber, DO    Nerve Conduction Studies Anti Sensory Summary Table   Stim Site NR Peak (ms) Norm Peak (ms) P-T Amp (V) Norm P-T Amp  Left Sup Peroneal Anti Sensory (Ant Lat Mall)  32C  12 cm    2.5 <4.6 10.4 >3  Right Sup Peroneal Anti Sensory (Ant Lat Mall)  32C  12 cm    2.7 <4.6 7.6 >3  Left Sural Anti Sensory (Lat Mall)  32C  Calf    2.7 <4.6 20.5 >3  Right Sural Anti Sensory (Lat Mall)  32C  Calf    2.5 <4.6 17.4 >3   Motor Summary Table   Stim Site NR Onset (ms) Norm Onset (ms) O-P Amp (mV) Norm O-P Amp Site1 Site2 Delta-0 (ms) Dist (cm) Vel (m/s) Norm Vel (m/s)  Left Peroneal Motor (Ext Dig Brev)  32C  Ankle    4.5 <6.0 4.0 >2.5 B Fib Ankle 7.8 33.0 42 >40  B Fib    12.3  3.6  Poplt B Fib 1.8 9.0 50 >40  Poplt    14.1  3.0         Right Peroneal Motor (Ext Dig Brev)  32C  Ankle     3.5 <6.0 6.4 >2.5 B Fib Ankle 8.0 35.0 44 >40  B Fib    11.5  6.4  Poplt B Fib 1.4 8.0 57 >40  Poplt    12.9  6.2         Left Tibial Motor ([place]d Hall Brev)  32C  Ankle    4.2 <6.0 8.3 >4 Knee Ankle 9.2 39.0 42 >40  Knee    13.4  5.9         Right Tibial Motor (Abd Hall Brev)  32C  Ankle    3.4 <6.0 9.4 >4 Knee Ankle 8.9 43.0 48 >40  Knee    12.3  7.3          H Reflex Studies   NR H-Lat (ms) Lat Norm (ms) L-R H-Lat (ms)  Left Tibial (Gastroc)  32C     33.74 <35 0.00  Right Tibial (Gastroc)  32C     33.74 <35 0.00   EMG   Side Muscle Ins Act Fibs Psw Fasc Number Recrt Dur Dur. Amp Amp. Poly Poly. Comment  Right AntTibialis Nml Nml Nml Nml 1- Rapid Some 1+ Some 1+ Some 1+ N/A  Right Flex Dig Long Nml Nml Nml Nml 1- Rapid Some 1+ Some 1+ Some 1+ N/A  Right  GluteusMed Nml Nml Nml Nml 1- Rapid Some 1+ Some 1+ Some 1+ N/A  Right Gastroc Nml Nml Nml Nml Nml Nml Nml Nml Nml Nml Nml Nml N/A  Right RectFemoris Nml Nml Nml Nml Nml Nml Nml Nml Nml Nml Nml Nml N/A  Left AntTibialis Nml Nml Nml Nml 1- Rapid Some 1+ Some 1+ Some 1+ N/A  Left Gastroc Nml Nml Nml Nml Nml Nml Nml Nml Nml Nml Nml Nml N/A  Left Flex Dig Long Nml Nml Nml Nml Nml Nml Nml Nml Nml Nml Nml Nml N/A  Left RectFemoris Nml Nml Nml Nml Nml Nml Nml Nml Nml Nml Nml Nml N/A  Left GluteusMed Nml Nml Nml Nml 1- Rapid Some 1+ Some 1+ Some 1+ N/A      Waveforms:

## 2020-04-10 NOTE — Telephone Encounter (Signed)
-----   Message from Alda Berthold, DO sent at 04/10/2020 11:56 AM EDT ----- Please let pt know his MRI shows mild progression of multiple sclerosis, no active lesions.  We can discuss medications, if interested, at his next visit. Thanks.

## 2020-04-12 DIAGNOSIS — R262 Difficulty in walking, not elsewhere classified: Secondary | ICD-10-CM | POA: Diagnosis not present

## 2020-04-12 DIAGNOSIS — M5459 Other low back pain: Secondary | ICD-10-CM | POA: Diagnosis not present

## 2020-04-12 DIAGNOSIS — R2681 Unsteadiness on feet: Secondary | ICD-10-CM | POA: Diagnosis not present

## 2020-04-13 ENCOUNTER — Encounter: Payer: Self-pay | Admitting: Cardiovascular Disease

## 2020-04-13 ENCOUNTER — Other Ambulatory Visit: Payer: Self-pay

## 2020-04-13 ENCOUNTER — Ambulatory Visit: Payer: Medicare HMO | Admitting: Cardiovascular Disease

## 2020-04-13 VITALS — BP 140/82 | HR 85 | Ht 66.0 in | Wt 186.0 lb

## 2020-04-13 DIAGNOSIS — I2510 Atherosclerotic heart disease of native coronary artery without angina pectoris: Secondary | ICD-10-CM

## 2020-04-13 NOTE — Patient Instructions (Signed)

## 2020-04-16 LAB — HM DIABETES FOOT EXAM

## 2020-04-18 ENCOUNTER — Telehealth: Payer: Self-pay | Admitting: Family Medicine

## 2020-04-18 DIAGNOSIS — E1165 Type 2 diabetes mellitus with hyperglycemia: Secondary | ICD-10-CM

## 2020-04-18 NOTE — Telephone Encounter (Signed)
-----   Message from Ellamae Sia sent at 04/16/2020 11:41 AM EDT ----- Regarding: Lab orders for Wednesday, 4.20.22 Patient is scheduled for CPX labs, please order future labs, Thanks , Karna Christmas

## 2020-04-20 DIAGNOSIS — R262 Difficulty in walking, not elsewhere classified: Secondary | ICD-10-CM | POA: Diagnosis not present

## 2020-04-20 DIAGNOSIS — R2681 Unsteadiness on feet: Secondary | ICD-10-CM | POA: Diagnosis not present

## 2020-04-20 DIAGNOSIS — M5459 Other low back pain: Secondary | ICD-10-CM | POA: Diagnosis not present

## 2020-04-23 ENCOUNTER — Other Ambulatory Visit: Payer: Self-pay

## 2020-04-23 ENCOUNTER — Ambulatory Visit (INDEPENDENT_AMBULATORY_CARE_PROVIDER_SITE_OTHER): Payer: Medicare HMO

## 2020-04-23 DIAGNOSIS — I10 Essential (primary) hypertension: Secondary | ICD-10-CM | POA: Diagnosis not present

## 2020-04-23 DIAGNOSIS — I2510 Atherosclerotic heart disease of native coronary artery without angina pectoris: Secondary | ICD-10-CM

## 2020-04-23 DIAGNOSIS — Z9181 History of falling: Secondary | ICD-10-CM

## 2020-04-23 NOTE — Patient Instructions (Addendum)
Visit Information:  Thank you for taking the time to speak with me today.    PATIENT GOALS: Goals Addressed            This Visit's Progress   . Improve my heart health: Coronary artery disease   On track    Timeframe:  Long-Range Goal Priority:  High Start Date:  02/24/2020                          Expected End Date:  07/12/2020                    Follow Up Date 05/28/2020   -  Continue to watch for signs of a heart attack: chest pain or discomfort, feeling weak, lightheaded or faint, pain or discomfort in the jaw, neck or back, Pain or discomfort in one or both arms or shoulders, Shortness of breath. - Call 911 for severe symptoms -  Continue to follow up with your cardiologist as recommended and report any ongoing or new symptoms to your doctor as soon as possible.  -  Continue to take your medications as prescribed.  -  Continue to monitor and record your blood pressure at least every 2-3 weeks.  Take these reading to your provider appointment.    -  Continue to adhere to a heart healthy diet consisting of fruits, vegetables and whole grains - and low in saturated fat, cholesterol, sodium (salt) and added sugar. - Discuss adding exercise to your daily routine with your doctor Doristine Devoid job incorporating household chore of grass cutting into a weekly exercise)     Why is this important?    Lifestyle changes are key to improving the blood flow to your heart. Think about the things you can change and set a goal to live healthy.   Remember, when the blood vessels to your heart start to get clogged you may not have any symptoms.   Over time, they can get worse.   Don't ignore the signs, like chest pain, and get help right away.         . Patient will report having no falls   On track    Timeframe:  Long-Range Goal Priority:  High Start Date:  02/24/2020                           Expected End Date: 07/12/2020                  Follow Up Date 05/28/2020   - keep your cell phone with  me always - Now that you are cutting your grass (cut your grass in the cool of the day and keep your cell phone with you at all times) - Keep walkways clear of clutter and avoid throw rugs or use non slip rugs - use assistive walking device as needed or as advised by your provider - use a nightlight in the bathroom/ room/ hallway - wear my glasses and/or hearing aid - attend physical therapy as advised  - Continue to follow up with the neurologist as advised.  - Wear secure fitting shoes at all times with ambulation  Why is this important?    Most falls happen when it is hard for you to walk safely. Your balance may be off because of an illness. You may have pain in your knees, hip or other joints.   You may be overly tired  or taking medicines that make you sleepy. You may not be able to see or hear clearly.   Falls can lead to broken bones, bruises or other injuries.   There are things you can do to help prevent falling.         . Patient will report returning to his parttime job in 6 months   On track    Timeframe:  Long-Range Goal Priority:  High Start Date:  02/24/2019                           Expected End Date: 08/25/2020                    Follow Up Date: 05/28/2020  - Call your provider office for new concerns or questions - talk with your employer about a plan to go back to work  - Follow up with your providers as recommended and discuss return to work strategy - Continue physical therapy treatment as recommended by your provider.     Why is this important?    You might be worried about going back to work after a heart attack.   You may wonder if you will have another heart attack if you do.    You might also think that you can't do what you used to do.   Going back to work is possible.    You, your doctor and employer will need to work together to figure how to make it okay for you.         . Track and Manage My Blood Pressure-Hypertension   On track     Timeframe:  Long-Range Goal Priority:  Medium Start Date: 02/24/2020                            Expected End Date:   07/12/2020                    Follow Up Date 05/28/2020   -  Start checking your blood pressure at least 2 times per week and record results.  -  Continue to take medications as prescribed - Continue to attend scheduled provider appointments - Calls provider office for new concerns, questions, or BP outside discussed parameters - Follows a low sodium diet / DASH     Why is this important?    You won't feel high blood pressure, but it can still hurt your blood vessels.   High blood pressure can cause heart or kidney problems. It can also cause a stroke.   Making lifestyle changes like losing a little weight or eating less salt will help.   Checking your blood pressure at home and at different times of the day can help to control blood pressure.   If the doctor prescribes medicine remember to take it the way the doctor ordered.   Call the office if you cannot afford the medicine or if there are questions about it.             How to Take Your Blood Pressure Blood pressure measures how strongly your blood is pressing against the walls of your arteries. Arteries are blood vessels that carry blood from your heart throughout your body. You can take your blood pressure at home with a machine. You may need to check your blood pressure at home:  To check if you have high blood pressure (hypertension).  To check your blood pressure over time.  To make sure your blood pressure medicine is working. Supplies needed:  Blood pressure machine, or monitor.  Dining room chair to sit in.  Table or desk.  Small notebook.  Pencil or pen. How to prepare Avoid these things for 30 minutes before checking your blood pressure:  Having drinks with caffeine in them, such as coffee or tea.  Drinking alcohol.  Eating.  Smoking.  Exercising. Do these things five minutes  before checking your blood pressure:  Go to the bathroom and pee (urinate).  Sit in a dining chair. Do not sit in a soft couch or an armchair.  Be quiet. Do not talk. How to take your blood pressure Follow the instructions that came with your machine. If you have a digital blood pressure monitor, these may be the instructions: 1. Sit up straight. 2. Place your feet on the floor. Do not cross your ankles or legs. 3. Rest your left arm at the level of your heart. You may rest it on a table, desk, or chair. 4. Pull up your shirt sleeve. 5. Wrap the blood pressure cuff around the upper part of your left arm. The cuff should be 1 inch (2.5 cm) above your elbow. It is best to wrap the cuff around bare skin. 6. Fit the cuff snugly around your arm. You should be able to place only one finger between the cuff and your arm. 7. Place the cord so that it rests in the bend of your elbow. 8. Press the power button. 9. Sit quietly while the cuff fills with air and loses air. 10. Write down the numbers on the screen. 11. Wait 2-3 minutes and then repeat steps 1-10.   What do the numbers mean? Two numbers make up your blood pressure. The first number is called systolic pressure. The second is called diastolic pressure. An example of a blood pressure reading is "120 over 80" (or 120/80). If you are an adult and do not have a medical condition, use this guide to find out if your blood pressure is normal: Normal  First number: below 120.  Second number: below 80. Elevated  First number: 120-129.  Second number: below 80. Hypertension stage 1  First number: 130-139.  Second number: 80-89. Hypertension stage 2  First number: 140 or above.  Second number: 4 or above. Your blood pressure is above normal even if only the top or bottom number is above normal. Follow these instructions at home:  Check your blood pressure as often as your doctor tells you to.  Check your blood pressure at the  same time every day.  Take your monitor to your next doctor's appointment. Your doctor will: ? Make sure you are using it correctly. ? Make sure it is working right.  Make sure you understand what your blood pressure numbers should be.  Tell your doctor if your medicine is causing side effects.  Keep all follow-up visits as told by your doctor. This is important. General tips:  You will need a blood pressure machine, or monitor. Your doctor can suggest a monitor. You can buy one at a drugstore or online. When choosing one: ? Choose one with an arm cuff. ? Choose one that wraps around your upper arm. Only one finger should fit between your arm and the cuff. ? Do not choose one that measures your blood pressure from your wrist or finger. Where to find more information American Heart Association: www.heart.org Contact  a doctor if:  Your blood pressure keeps being high. Get help right away if:  Your first blood pressure number is higher than 180.  Your second blood pressure number is higher than 120. Summary  Check your blood pressure at the same time every day.  Avoid caffeine, alcohol, smoking, and exercise for 30 minutes before checking your blood pressure.  Make sure you understand what your blood pressure numbers should be. This information is not intended to replace advice given to you by your health care provider. Make sure you discuss any questions you have with your health care provider.    DASH Eating Plan DASH stands for Dietary Approaches to Stop Hypertension. The DASH eating plan is a healthy eating plan that has been shown to:  Reduce high blood pressure (hypertension).  Reduce your risk for type 2 diabetes, heart disease, and stroke.  Help with weight loss. What are tips for following this plan? Reading food labels  Check food labels for the amount of salt (sodium) per serving. Choose foods with less than 5 percent of the Daily Value of sodium. Generally,  foods with less than 300 milligrams (mg) of sodium per serving fit into this eating plan.  To find whole grains, look for the word "whole" as the first word in the ingredient list. Shopping  Buy products labeled as "low-sodium" or "no salt added."  Buy fresh foods. Avoid canned foods and pre-made or frozen meals. Cooking  Avoid adding salt when cooking. Use salt-free seasonings or herbs instead of table salt or sea salt. Check with your health care provider or pharmacist before using salt substitutes.  Do not fry foods. Cook foods using healthy methods such as baking, boiling, grilling, roasting, and broiling instead.  Cook with heart-healthy oils, such as olive, canola, avocado, soybean, or sunflower oil. Meal planning  Eat a balanced diet that includes: ? 4 or more servings of fruits and 4 or more servings of vegetables each day. Try to fill one-half of your plate with fruits and vegetables. ? 6-8 servings of whole grains each day. ? Less than 6 oz (170 g) of lean meat, poultry, or fish each day. A 3-oz (85-g) serving of meat is about the same size as a deck of cards. One egg equals 1 oz (28 g). ? 2-3 servings of low-fat dairy each day. One serving is 1 cup (237 mL). ? 1 serving of nuts, seeds, or beans 5 times each week. ? 2-3 servings of heart-healthy fats. Healthy fats called omega-3 fatty acids are found in foods such as walnuts, flaxseeds, fortified milks, and eggs. These fats are also found in cold-water fish, such as sardines, salmon, and mackerel.  Limit how much you eat of: ? Canned or prepackaged foods. ? Food that is high in trans fat, such as some fried foods. ? Food that is high in saturated fat, such as fatty meat. ? Desserts and other sweets, sugary drinks, and other foods with added sugar. ? Full-fat dairy products.  Do not salt foods before eating.  Do not eat more than 4 egg yolks a week.  Try to eat at least 2 vegetarian meals a week.  Eat more home-cooked  food and less restaurant, buffet, and fast food.   Lifestyle  When eating at a restaurant, ask that your food be prepared with less salt or no salt, if possible.  If you drink alcohol: ? Limit how much you use to:  0-1 drink a day for women who are not  pregnant.  0-2 drinks a day for men. ? Be aware of how much alcohol is in your drink. In the [place] one drink equals one 12 oz bottle of beer (355 mL), one 5 oz glass of wine (148 mL), or one 1 oz glass of hard liquor (44 mL). General information  Avoid eating more than 2,300 mg of salt a day. If you have hypertension, you may need to reduce your sodium intake to 1,500 mg a day.  Work with your health care provider to maintain a healthy body weight or to lose weight. Ask what an ideal weight is for you.  Get at least 30 minutes of exercise that causes your heart to beat faster (aerobic exercise) most days of the week. Activities may include walking, swimming, or biking.  Work with your health care provider or dietitian to adjust your eating plan to your individual calorie needs. What foods should I eat? Fruits All fresh, dried, or frozen fruit. Canned fruit in natural juice (without added sugar). Vegetables Fresh or frozen vegetables (raw, steamed, roasted, or grilled). Low-sodium or reduced-sodium tomato and vegetable juice. Low-sodium or reduced-sodium tomato sauce and tomato paste. Low-sodium or reduced-sodium canned vegetables. Grains Whole-grain or whole-wheat bread. Whole-grain or whole-wheat pasta. Brown rice. [place] Morrow. Bulgur. Whole-grain and low-sodium cereals. Pita bread. Low-fat, low-sodium crackers. Whole-wheat flour tortillas. Meats and other proteins Skinless chicken or [place]. Ground chicken or [place]. Pork with fat trimmed off. Fish and seafood. Egg whites. Dried beans, peas, or lentils. Unsalted nuts, nut butters, and seeds. Unsalted canned beans. Lean cuts of beef with fat trimmed off. Low-sodium, lean precooked or  cured meat, such as sausages or meat loaves. Dairy Low-fat (1%) or fat-free (skim) milk. Reduced-fat, low-fat, or fat-free cheeses. Nonfat, low-sodium ricotta or cottage cheese. Low-fat or nonfat yogurt. Low-fat, low-sodium cheese. Fats and oils Soft margarine without trans fats. Vegetable oil. Reduced-fat, low-fat, or light mayonnaise and salad dressings (reduced-sodium). Canola, safflower, olive, avocado, soybean, and sunflower oils. Avocado. Seasonings and condiments Herbs. Spices. Seasoning mixes without salt. Other foods Unsalted popcorn and pretzels. Fat-free sweets. The items listed above may not be a complete list of foods and beverages you can eat. Contact a dietitian for more information. What foods should I avoid? Fruits Canned fruit in a light or heavy syrup. Fried fruit. Fruit in cream or butter sauce. Vegetables Creamed or fried vegetables. Vegetables in a cheese sauce. Regular canned vegetables (not low-sodium or reduced-sodium). Regular canned tomato sauce and paste (not low-sodium or reduced-sodium). Regular tomato and vegetable juice (not low-sodium or reduced-sodium). Angie Fava. Olives. Grains Baked goods made with fat, such as croissants, muffins, or some breads. Dry pasta or rice meal packs. Meats and other proteins Fatty cuts of meat. Ribs. Fried meat. Berniece Salines. Bologna, salami, and other precooked or cured meats, such as sausages or meat loaves. Fat from the back of a pig (fatback). Bratwurst. Salted nuts and seeds. Canned beans with added salt. Canned or smoked fish. Whole eggs or egg yolks. Chicken or [place] with skin. Dairy Whole or 2% milk, cream, and half-and-half. Whole or full-fat cream cheese. Whole-fat or sweetened yogurt. Full-fat cheese. Nondairy creamers. Whipped toppings. Processed cheese and cheese spreads. Fats and oils Butter. Stick margarine. Lard. Shortening. Ghee. Bacon fat. Tropical oils, such as coconut, palm kernel, or palm oil. Seasonings and  condiments Onion salt, garlic salt, seasoned salt, table salt, and sea salt. Worcestershire sauce. Tartar sauce. Barbecue sauce. Teriyaki sauce. Soy sauce, including reduced-sodium. Steak sauce. Canned and packaged gravies. Fish  sauce. Oyster sauce. Cocktail sauce. Store-bought horseradish. Ketchup. Mustard. Meat flavorings and tenderizers. Bouillon cubes. Hot sauces. Pre-made or packaged marinades. Pre-made or packaged taco seasonings. Relishes. Regular salad dressings. Other foods Salted popcorn and pretzels. The items listed above may not be a complete list of foods and beverages you should avoid. Contact a dietitian for more information. Where to find more information  National Heart, Lung, and Blood Institute: https://wilson-eaton.com/  American Heart Association: www.heart.org  Academy of Nutrition and Dietetics: www.eatright.[place]: www.kidney.org Summary  The DASH eating plan is a healthy eating plan that has been shown to reduce high blood pressure (hypertension). It may also reduce your risk for type 2 diabetes, heart disease, and stroke.  When on the DASH eating plan, aim to eat more fresh fruits and vegetables, whole grains, lean proteins, low-fat dairy, and heart-healthy fats.  With the DASH eating plan, you should limit salt (sodium) intake to 2,300 mg a day. If you have hypertension, you may need to reduce your sodium intake to 1,500 mg a day.  Work with your health care provider or dietitian to adjust your eating plan to your individual calorie needs. This information is not intended to replace advice given to you by your health care provider. Make sure you discuss any questions you have with your health care provider.    Patient verbalizes understanding of instructions provided today and agrees to view in [place].   The patient has been provided with contact information for the care management team and has been advised to call with any health related  questions or concerns.  The care management team will reach out to the patient again over the next 45 days.   Quinn Plowman RN,BSN,CCM RN Case Manager [facility]  [phone]     Document Revised: 12/24/2018 Document Reviewed: 12/24/2018 Elsevier Patient Education  2021 [place].  [email or web address]">  DASH Eating Plan DASH stands for Dietary Approaches to Stop Hypertension. The DASH eating plan is a healthy eating plan that has been shown to:  Reduce high blood pressure (hypertension).  Reduce your risk for type 2 diabetes, heart disease, and stroke.  Help with weight loss. What are tips for following this plan? Reading food labels  Check food labels for the amount of salt (sodium) per serving. Choose foods with less than 5 percent of the Daily Value of sodium. Generally, foods with less than 300 milligrams (mg) of sodium per serving fit into this eating plan.  To find whole grains, look for the word "whole" as the first word in the ingredient list. Shopping  Buy products labeled as "low-sodium" or "no salt added."  Buy fresh foods. Avoid canned foods and pre-made or frozen meals. Cooking  Avoid adding salt when cooking. Use salt-free seasonings or herbs instead of table salt or sea salt. Check with your health care provider or pharmacist before using salt substitutes.  Do not fry foods. Cook foods using healthy methods such as baking, boiling, grilling, roasting, and broiling instead.  Cook with heart-healthy oils, such as olive, canola, avocado, soybean, or sunflower oil. Meal planning  Eat a balanced diet that includes: ? 4 or more servings of fruits and 4 or more servings of vegetables each day. Try to fill one-half of your plate with fruits and vegetables. ? 6-8 servings of whole grains each day. ? Less than 6 oz (170 g) of lean meat, poultry, or fish each day. A 3-oz (85-g) serving of meat is about the  same  size as a deck of cards. One egg equals 1 oz (28 g). ? 2-3 servings of low-fat dairy each day. One serving is 1 cup (237 mL). ? 1 serving of nuts, seeds, or beans 5 times each week. ? 2-3 servings of heart-healthy fats. Healthy fats called omega-3 fatty acids are found in foods such as walnuts, flaxseeds, fortified milks, and eggs. These fats are also found in cold-water fish, such as sardines, salmon, and mackerel.  Limit how much you eat of: ? Canned or prepackaged foods. ? Food that is high in trans fat, such as some fried foods. ? Food that is high in saturated fat, such as fatty meat. ? Desserts and other sweets, sugary drinks, and other foods with added sugar. ? Full-fat dairy products.  Do not salt foods before eating.  Do not eat more than 4 egg yolks a week.  Try to eat at least 2 vegetarian meals a week.  Eat more home-cooked food and less restaurant, buffet, and fast food.   Lifestyle  When eating at a restaurant, ask that your food be prepared with less salt or no salt, if possible.  If you drink alcohol: ? Limit how much you use to:  0-1 drink a day for women who are not pregnant.  0-2 drinks a day for men. ? Be aware of how much alcohol is in your drink. In the [place] one drink equals one 12 oz bottle of beer (355 mL), one 5 oz glass of wine (148 mL), or one 1 oz glass of hard liquor (44 mL). General information  Avoid eating more than 2,300 mg of salt a day. If you have hypertension, you may need to reduce your sodium intake to 1,500 mg a day.  Work with your health care provider to maintain a healthy body weight or to lose weight. Ask what an ideal weight is for you.  Get at least 30 minutes of exercise that causes your heart to beat faster (aerobic exercise) most days of the week. Activities may include walking, swimming, or biking.  Work with your health care provider or dietitian to adjust your eating plan to your individual calorie needs. What foods  should I eat? Fruits All fresh, dried, or frozen fruit. Canned fruit in natural juice (without added sugar). Vegetables Fresh or frozen vegetables (raw, steamed, roasted, or grilled). Low-sodium or reduced-sodium tomato and vegetable juice. Low-sodium or reduced-sodium tomato sauce and tomato paste. Low-sodium or reduced-sodium canned vegetables. Grains Whole-grain or whole-wheat bread. Whole-grain or whole-wheat pasta. Brown rice. [place]. Bulgur. Whole-grain and low-sodium cereals. Pita bread. Low-fat, low-sodium crackers. Whole-wheat flour tortillas. Meats and other proteins Skinless chicken or [place]. Ground chicken or [place]. Pork with fat trimmed off. Fish and seafood. Egg whites. Dried beans, peas, or lentils. Unsalted nuts, nut butters, and seeds. Unsalted canned beans. Lean cuts of beef with fat trimmed off. Low-sodium, lean precooked or cured meat, such as sausages or meat loaves. Dairy Low-fat (1%) or fat-free (skim) milk. Reduced-fat, low-fat, or fat-free cheeses. Nonfat, low-sodium ricotta or cottage cheese. Low-fat or nonfat yogurt. Low-fat, low-sodium cheese. Fats and oils Soft margarine without trans fats. Vegetable oil. Reduced-fat, low-fat, or light mayonnaise and salad dressings (reduced-sodium). Canola, safflower, olive, avocado, soybean, and sunflower oils. Avocado. Seasonings and condiments Herbs. Spices. Seasoning mixes without salt. Other foods Unsalted popcorn and pretzels. Fat-free sweets. The items listed above may not be a complete list of foods and beverages you can eat. Contact a dietitian for more information. What foods  should I avoid? Fruits Canned fruit in a light or heavy syrup. Fried fruit. Fruit in cream or butter sauce. Vegetables Creamed or fried vegetables. Vegetables in a cheese sauce. Regular canned vegetables (not low-sodium or reduced-sodium). Regular canned tomato sauce and paste (not low-sodium or reduced-sodium). Regular tomato and  vegetable juice (not low-sodium or reduced-sodium). Angie Fava. Olives. Grains Baked goods made with fat, such as croissants, muffins, or some breads. Dry pasta or rice meal packs. Meats and other proteins Fatty cuts of meat. Ribs. Fried meat. Berniece Salines. Bologna, salami, and other precooked or cured meats, such as sausages or meat loaves. Fat from the back of a pig (fatback). Bratwurst. Salted nuts and seeds. Canned beans with added salt. Canned or smoked fish. Whole eggs or egg yolks. Chicken or [place] with skin. Dairy Whole or 2% milk, cream, and half-and-half. Whole or full-fat cream cheese. Whole-fat or sweetened yogurt. Full-fat cheese. Nondairy creamers. Whipped toppings. Processed cheese and cheese spreads. Fats and oils Butter. Stick margarine. Lard. Shortening. Ghee. Bacon fat. Tropical oils, such as coconut, palm kernel, or palm oil. Seasonings and condiments Onion salt, garlic salt, seasoned salt, table salt, and sea salt. Worcestershire sauce. Tartar sauce. Barbecue sauce. Teriyaki sauce. Soy sauce, including reduced-sodium. Steak sauce. Canned and packaged gravies. Fish sauce. Oyster sauce. Cocktail sauce. Store-bought horseradish. Ketchup. Mustard. Meat flavorings and tenderizers. Bouillon cubes. Hot sauces. Pre-made or packaged marinades. Pre-made or packaged taco seasonings. Relishes. Regular salad dressings. Other foods Salted popcorn and pretzels. The items listed above may not be a complete list of foods and beverages you should avoid. Contact a dietitian for more information. Where to find more information  National Heart, Lung, and Blood Institute: https://wilson-eaton.com/  American Heart Association: www.heart.org  Academy of Nutrition and Dietetics: www.eatright.[place]: www.kidney.org Summary  The DASH eating plan is a healthy eating plan that has been shown to reduce high blood pressure (hypertension). It may also reduce your risk for type 2 diabetes, heart  disease, and stroke.  When on the DASH eating plan, aim to eat more fresh fruits and vegetables, whole grains, lean proteins, low-fat dairy, and heart-healthy fats.  With the DASH eating plan, you should limit salt (sodium) intake to 2,300 mg a day. If you have hypertension, you may need to reduce your sodium intake to 1,500 mg a day.  Work with your health care provider or dietitian to adjust your eating plan to your individual calorie needs. This information is not intended to replace advice given to you by your health care provider. Make sure you discuss any questions you have with your health care provider. Document Revised: 12/03/2018 Document Reviewed: 12/03/2018 Elsevier Patient Education  2021 [employer].

## 2020-04-23 NOTE — Chronic Care Management (AMB) (Signed)
Chronic Care Management   CCM RN Visit Note  04/23/2020 Name: Roy [name] MRN: [id number] DOB: 1945/08/19  Subjective: Roy [name] is a 75 y.o. year old male who is a primary care patient of [name], [name], MD. The care management team was consulted for assistance with disease management and care coordination needs.    Engaged with patient by telephone for follow up visit in response to provider referral for case management and/or care coordination services.   Consent to Services:  The patient was given information about Chronic Care Management services, agreed to services, and gave verbal consent prior to initiation of services.  Please see initial visit note for detailed documentation.   Patient agreed to services and verbal consent obtained.   Assessment: Review of patient past medical history, allergies, medications, health status, including review of consultants reports, laboratory and other test data, was performed as part of comprehensive evaluation and provision of chronic care management services.   SDOH (Social Determinants of Health) assessments and interventions performed:    CCM Care Plan  No Known Allergies  Outpatient Encounter Medications as of 04/23/2020  Medication Sig  . acetaminophen (TYLENOL) 325 MG tablet Take 2 tablets (650 mg total) by mouth every 6 (six) hours as needed. (Patient taking differently: Take 650 mg by mouth every 6 (six) hours as needed for moderate pain.)  . carvedilol (COREG) 12.5 MG tablet TAKE 1 TABLET BY MOUTH TWICE A DAY (Patient taking differently: Take 12.5 mg by mouth 2 (two) times daily with a meal.)  . clopidogrel (PLAVIX) 75 MG tablet TAKE 1 TABLET BY MOUTH EVERY DAY (Patient taking differently: Take 75 mg by mouth daily.)  . fluocinonide (LIDEX) 0.05 % external solution Apply 1 application topically at bedtime as needed (psoriasis).  . hydrocortisone cream 1 % Apply 1 application topically 2 (two) times daily as needed for  itching.  . melatonin 3 MG TABS tablet Take 6 mg by mouth at bedtime as needed (sleep).  . nitroGLYCERIN (NITROSTAT) 0.4 MG SL tablet Place 1 tablet (0.4 mg total) under the tongue every 5 (five) minutes x 3 doses as needed for chest pain.  Marland Kitchen omeprazole (PRILOSEC) 20 MG capsule TAKE 1 CAPSULE BY MOUTH EVERY DAY (Patient taking differently: Take 20 mg by mouth daily.)  . oxyCODONE (OXY IR/ROXICODONE) 5 MG immediate release tablet TAKE 1 TABLET BY MOUTH EVERY 6 HOURS AS NEEDED FOR SEVERE PAIN NOT RELIEVED BY TYLENOL, IBUPROFEN, REST, OR ICE.  Marland Kitchen triamcinolone (KENALOG) 0.1 % Apply 1 application topically 3 (three) times daily as needed (psoriasis).  . venlafaxine XR (EFFEXOR-XR) 37.5 MG 24 hr capsule TAKE ONE TABLET DAILY IN ADDITION TO 75 MG TABLETS  . venlafaxine XR (EFFEXOR-XR) 75 MG 24 hr capsule TAKE 1 CAPSULE (75 MG TOTAL) BY MOUTH DAILY WITH BREAKFAST. (Patient taking differently: Take 75 mg by mouth daily. Take with 37.5 mg to equal 112.5 mg daily)  . [DISCONTINUED] gabapentin (NEURONTIN) 300 MG capsule Take 1 capsule (300 mg total) by mouth 2 (two) times daily for 5 days.   No facility-administered encounter medications on file as of 04/23/2020.    Patient Active Problem List   Diagnosis Date Noted  . History of colostomy reversal 03/07/2020  . Chest pressure 09/26/2019  . Controlled type 2 diabetes mellitus with hyperglycemia ([place]) 07/23/2019  . Insomnia 07/23/2019  . Hypotension 07/01/2019  . Personal history of multiple sclerosis ([place]) 12/02/2018  . Weakness 03/31/2017  . Internal hemorrhoids with complication 28/31/5176  . CAD, RCA  PCI 2001, urgent Dx2 DES 04/24/14 04/24/2014  . Unstable angina with ST elelvation and NSVT while on treadmill 04/24/14   . History of basal cell carcinoma 03/07/2014  . GERD (gastroesophageal reflux disease) 03/07/2014  . Incomplete emptying of bladder 03/07/2014  . Obstructive sleep apnea 08/20/2009  . Dyslipidemia 11/09/2008  . Depression, major,  recurrent ([place]) 07/06/2007  . Essential hypertension 07/06/2007  . Coronary artery disease involving native coronary artery of native heart without angina pectoris 07/06/2007    Conditions to be addressed/monitored: CAD, HTN, Falls  Care Plan : Fall Risk (Adult)  Updates made by Dannielle Karvonen, RN since 04/23/2020 12:00 AM  Problem: At risk for falls   Priority: High  Long-Range Goal: Absence of Fall and Fall-Related Injury   Start Date: 02/24/2020  Expected End Date: 07/12/2020  This Visit's Progress: On track  Recent Progress: On track  Priority: High  Current Barriers:  Marland Kitchen Knowledge Deficits related to fall precautions in patient with symptoms of balance issues  Clinical Goal(s):  Marland Kitchen Patient will demonstrate improved adherence to prescribed treatment plan for decreasing falls:  Patient reports he is following fall precautions and attending physical therapy visits as recommended.  . Patient will not experience additional falls . Patient will verbalize understanding of plan for fall precautions:  . Patient will attend all scheduled medical appointments:  Patient reports having brain scan on 04/05/2020 and follow up appointment with Dr. Posey Pronto on 04/10/2020.  Patient reports brain scan did not show any concern. He states Dr. Posey Pronto ordered physical therapy 2 times per week which he has since started. Patient confirms follow up with neurologist, Dr. Posey Pronto is 05/14/20  Interventions:  . Collaboration with Jinny Sanders, MD regarding development and update of comprehensive plan of care as evidenced by provider attestation and co-signature . Inter-disciplinary care team collaboration (see longitudinal plan of care) . Provided verbal education re: Potential causes of falls and Fall prevention strategies:  RNCM advised patient to start monitoring his blood pressure at least 2 times per day and write in log.  Discussed blood pressure parameters and how to be aware of low and high blood pressure readings.   Advised to report these readings to his provider.  . Assessed for falls since last encounter. Patient denies any falls since last telephone outreach with [facility]. Marland Kitchen Assessed patients knowledge of fall risk prevention:  RNCM reviewed fall precautions with patient.  Patient reports he is now starting to cut his grass again with a push mower.  RNCM advised patient to do this in the cool of the day either morning or early evening and always have your cell phone with you in the event of an emergency.  Patient verbalized understanding and agreement.  Patient Goals/Self-Care Activities Patient will:   - keep your cell phone with me always - Now that you are cutting your grass (cut your grass in the cool of the day and keep your cell phone with you at all times) - Keep walkways clear of clutter and avoid throw rugs or use non slip rugs - use assistive walking device as needed or as advised by your provider - use a nightlight in the bathroom/ room/ hallway - wear my glasses and/or hearing aid - attend physical therapy as advised  - Continue to follow up with the neurologist as advised.  - Wear secure fitting shoes at all times with ambulation  Follow Up Plan: The patient has been provided with contact information for the care management team and  has been advised to call with any health related questions or concerns.  The care management team will reach out to the patient again over the next 45 days.     Problem: Decrease quality of life   Priority: High  Long-Range Goal: Development of long term plan for return to work   Start Date: 03/21/2020  Expected End Date: 07/12/2020  This Visit's Progress: On track  Recent Progress: On track  Priority: Medium  Current Barriers . Knowledge deficit related to long term plan for return to work Clinical Goal(s):  Marland Kitchen Collaboration with Jinny Sanders, MD regarding development and update of comprehensive plan of care as evidenced by provider attestation and  co-signature . Inter-disciplinary care team collaboration (see longitudinal plan of care)  patient will work with care management team to address care coordination and chronic disease management needs  Allow patient opportunity to express thoughts about present/future circumstances  Complete advised testing and treatment options recommended by your doctor: Patient reports having brain scan done on 04/05/2020 and having follow up with neurologist on 04/10/2020. Patient reports no findings from brain scan and states he is to continue with physical therapy treatment.  Interventions:   Evaluation of current treatment plan related to self-management and patient's adherence to plan as established by provider.  Collaboration with Jinny Sanders, MD regarding development and update of comprehensive plan of care as evidenced by provider attestation       and co-signature  Inter-disciplinary care team collaboration (see longitudinal plan of care)  Discussed plans with patient for ongoing care management follow up and provided patient with direct contact information for care management team  Advised client to discuss return to work plan with provider and employer. Patient [place] Activities:  - Call your provider office for new concerns or questions - talk with your employer about a plan to go back to work  - Follow up with your providers as recommended and discuss return to work strategy - Continue physical therapy treatment as recommended by your provider.  Follow Up Plan: The patient has been provided with contact information for the care management team and has been advised to call with any health related questions or concerns.  The care management team will reach out to the patient again over the next 45 days.     Care Plan : Coronary Artery Disease (Adult)  Updates made by Dannielle Karvonen, RN since 04/23/2020 12:00 AM  Problem: Knowledge deficit related to coronary artery disease  management   Priority: Medium  Onset Date: 02/24/2020  Long-Range Goal: Disease Progression Prevented or Minimized   Start Date: 02/24/2020  Expected End Date: 07/12/2020  This Visit's Progress: On track  Recent Progress: On track  Priority: Medium  Current Barriers:  Marland Kitchen Knowledge deficit related to self management of coronary artery disease: Patient reports he is not checking his blood pressures.  Clinical Goal(s) related to coronary artery disease    patient will:  . Work with the care management team to address educational, disease management, and care coordination needs  . Begin or continue self health monitoring activities as directed: Measure and record blood pressure at least 2 times per week.  . Call provider office for new or worsened signs and symptoms: Chest pain, shortness of breath, weakness, light-headedness, nausea or cold sweat.  . Call care management team with questions or concerns . Keep follow up appointments with your providers: Patient reports having a follow up appointment with his cardiologist on 04/13/2020.  He states he is to continue his blood pressure medication, PLAVIX, and continue following a low sodium DASH diet.   . Eat a heart healthy diet consisting of fruits, vegetables and whole grains - and low in saturated fat, cholesterol, sodium (salt) and added sugar:  RNCM discussed a low salt, DASH diet with patient.  Discussed low salt ways to add flavor/ seasoning to foods.  Interventions related to coronary artery disease:  . Evaluation of current treatment plans and patient's adherence to plan as established by provider: Discussed monitoring and recording blood pressure 2 times per week and adhering to low salt / DASH diet. Patient reports he is now cutting his grass weekly with a push mower. He states this allows him to walk approximately 45 minutes.  . Provided disease specific education to patient:  RNCM provided patient education article on high blood pressure  management . Collaborated with appropriate clinical care team members regarding patient needs Patient Self Care Activities related to coronary artery disease:  -  Continue to watch for signs of a heart attack: chest pain or discomfort, feeling weak, lightheaded or faint, pain or discomfort in the jaw, neck or back, Pain or discomfort in one or both arms or shoulders, Shortness of breath. - Call 911 for severe symptoms -  Continue to follow up with your cardiologist as recommended and report any ongoing or new symptoms to your doctor as soon as possible.  -  Continue to take your medications as prescribed.  -  Continue to monitor and record your blood pressure at least every 2-3 weeks.  Take these reading to your provider appointment.    -  Continue to adhere to a heart healthy diet consisting of fruits, vegetables and whole grains - and low in saturated fat, cholesterol, sodium (salt) and added sugar. - Discuss adding exercise to your daily routine with your doctor Doristine Devoid job incorporating household chore of grass cutting into a weekly exercise)       Care Plan : Hypertension (Adult)  Updates made by Dannielle Karvonen, RN since 04/23/2020 12:00 AM  Problem: Hypertension (Hypertension)   Priority: Medium  Long-Range Goal: Patient will verbalize blood pressure managed without hypertensive episodes.   Start Date: 02/24/2020  Expected End Date: 07/12/2020  This Visit's Progress: On track  Recent Progress: On track  Priority: Medium  Objective:  . Last practice recorded BP readings:  BP Readings from Last 3 Encounters:  02/09/20 (!) 145/93  01/19/20 122/84  10/06/19 120/78   Current Barriers:  Marland Kitchen Knowledge Deficits related to basic understanding of hypertension self care management:  Patient reports he is not checking his blood pressure. Case Manager Clinical Goal(s):  . patient will attend all scheduled medical appointments: Patient reports he followed up with his cardiologist on 04/13/2020 and  is scheduled to have an annual wellness visit on 05/08/2020.  . patient will demonstrate improved adherence to prescribed treatment plan for hypertension as evidenced by taking all medications as prescribed, monitoring and recording blood pressure as directed, adhering to low sodium diet:  RNCM discussed importance of monitoring blood pressure.  Requested patient monitor and record blood pressures at least 2 times a week.  Discussed low sodium / DASH diet. Discussed ways to season foods with fresh or dried herbs, no salt seasonings.  Interventions:  . Collaboration with Jinny Sanders, MD regarding development and update of comprehensive plan of care as evidenced by provider attestation and co-signature . Inter-disciplinary care team collaboration (see longitudinal plan of care) .  Reviewed medications with patient and discussed importance of compliance: Patient reports he is taking his medications as prescribed.  . Discussed plans with patient for ongoing care management follow up and provided patient with direct contact information for care management team . Advised patient, providing education and rationale, to monitor blood pressure daily and record, calling PCP for findings outside established parameters:  Discussed with how to read / understanding blood pressure results and how to monitor for low and high blood pressures.  RNCM will send patient education article on monitoring blood pressure at home.  . Verified patient has blood pressure monitor at home and verbalizes ability to use it Patient Goals/Self-Care Activities -  Start checking your blood pressure at least 2 times per week and record results.  -  Continue to take medications as prescribed - Continue to attend scheduled provider appointments - Calls provider office for new concerns, questions, or BP outside discussed parameters - Follows a low sodium diet / DASH  Follow Up Plan: The patient has been provided with contact information for  the care management team and has been advised to call with any health related questions or concerns.  The care management team will reach out to the patient again over the next 45 days.        Plan:The patient has been provided with contact information for the care management team and has been advised to call with any health related questions or concerns.  and The care management team will reach out to the patient again over the next 45 days.  Quinn Plowman RN,BSN,CCM RN Case Manager [place]  [phone]

## 2020-04-25 DIAGNOSIS — R2681 Unsteadiness on feet: Secondary | ICD-10-CM | POA: Diagnosis not present

## 2020-04-25 DIAGNOSIS — R262 Difficulty in walking, not elsewhere classified: Secondary | ICD-10-CM | POA: Diagnosis not present

## 2020-04-25 DIAGNOSIS — M5459 Other low back pain: Secondary | ICD-10-CM | POA: Diagnosis not present

## 2020-04-27 DIAGNOSIS — M5459 Other low back pain: Secondary | ICD-10-CM | POA: Diagnosis not present

## 2020-04-27 DIAGNOSIS — R262 Difficulty in walking, not elsewhere classified: Secondary | ICD-10-CM | POA: Diagnosis not present

## 2020-04-27 DIAGNOSIS — R2681 Unsteadiness on feet: Secondary | ICD-10-CM | POA: Diagnosis not present

## 2020-05-01 ENCOUNTER — Ambulatory Visit (INDEPENDENT_AMBULATORY_CARE_PROVIDER_SITE_OTHER): Payer: Medicare HMO

## 2020-05-01 ENCOUNTER — Other Ambulatory Visit: Payer: Self-pay

## 2020-05-01 DIAGNOSIS — Z0000 Encounter for general adult medical examination without abnormal findings: Secondary | ICD-10-CM | POA: Diagnosis not present

## 2020-05-01 NOTE — Progress Notes (Signed)
PCP notes:  Health Maintenance: Foot exam- due    Abnormal Screenings: none   Patient concerns: Wants to discuss referral to a new neurologist   Nurse concerns: none   Next PCP appt.: 05/08/2020 @ 2 pm

## 2020-05-01 NOTE — Progress Notes (Signed)
Subjective:   Roy [name] is a 75 y.o. male who presents for Medicare Annual/Subsequent preventive examination.  Review of Systems: N/A      I connected with the patient today by telephone and verified that I am speaking with the correct person using two identifiers. Location patient: home Location nurse: work Persons participating in the telephone visit: patient, nurse.   I discussed the limitations, risks, security and privacy concerns of performing an evaluation and management service by telephone and the availability of in person appointments. I also discussed with the patient that there may be a patient responsible charge related to this service. The patient expressed understanding and verbally consented to this telephonic visit.        Cardiac Risk Factors include: advanced age (>76mn, >>88women);male gender;diabetes mellitus;hypertension     Objective:    Today's Vitals   There is no height or weight on file to calculate BMI.  Advanced Directives 05/01/2020 03/07/2020 02/29/2020 02/24/2020 02/09/2020 09/16/2019 08/27/2019  Does Patient Have a Medical Advance Directive? _0  Yes Yes  Type of AParamedicof AEagleton VillageLiving will HGrosse Pointe FarmsLiving will HBuffaloLiving will [place]ings ValleyLiving will - HNic[place]Living will Living will;Healthcare Power of Attorney  Does patient want to make changes to medical advance directive? - No - Patient declined - No - Patient declined - No - Patient declined No - Patient declined  Copy of [place] Chart? No - copy requested No - copy requested - No - copy requested - - No - copy requested  Would patient like information on creating a medical advance directive? - - - - - - No - Patient declined  Pre-existing out of facility DNR order (yellow form or pink MOST form) - - - - - - -    Current Medications  (verified) Outpatient Encounter Medications as of 05/01/2020  Medication Sig  . acetaminophen (TYLENOL) 325 MG tablet Take 2 tablets (650 mg total) by mouth every 6 (six) hours as needed. (Patient taking differently: Take 650 mg by mouth every 6 (six) hours as needed for moderate pain.)  . carvedilol (COREG) 12.5 MG tablet TAKE 1 TABLET BY MOUTH TWICE A DAY (Patient taking differently: Take 12.5 mg by mouth 2 (two) times daily with a meal.)  . clopidogrel (PLAVIX) 75 MG tablet TAKE 1 TABLET BY MOUTH EVERY DAY (Patient taking differently: Take 75 mg by mouth daily.)  . fluocinonide (LIDEX) 0.05 % external solution Apply 1 application topically at bedtime as needed (psoriasis).  . hydrocortisone cream 1 % Apply 1 application topically 2 (two) times daily as needed for itching.  . melatonin 3 MG TABS tablet Take 6 mg by mouth at bedtime as needed (sleep).  . nitroGLYCERIN (NITROSTAT) 0.4 MG SL tablet Place 1 tablet (0.4 mg total) under the tongue every 5 (five) minutes x 3 doses as needed for chest pain.  .Marland Kitchenomeprazole (PRILOSEC) 20 MG capsule TAKE 1 CAPSULE BY MOUTH EVERY DAY (Patient taking differently: Take 20 mg by mouth daily.)  . oxyCODONE (OXY IR/ROXICODONE) 5 MG immediate release tablet TAKE 1 TABLET BY MOUTH EVERY 6 HOURS AS NEEDED FOR SEVERE PAIN NOT RELIEVED BY TYLENOL, IBUPROFEN, REST, OR ICE.  .Marland Kitchentriamcinolone (KENALOG) 0.1 % Apply 1 application topically 3 (three) times daily as needed (psoriasis).  . venlafaxine XR (EFFEXOR-XR) 37.5 MG 24 hr capsule TAKE ONE TABLET DAILY IN ADDITION TO 75 MG TABLETS  .  venlafaxine XR (EFFEXOR-XR) 75 MG 24 hr capsule TAKE 1 CAPSULE (75 MG TOTAL) BY MOUTH DAILY WITH BREAKFAST. (Patient taking differently: Take 75 mg by mouth daily. Take with 37.5 mg to equal 112.5 mg daily)  . [DISCONTINUED] gabapentin (NEURONTIN) 300 MG capsule Take 1 capsule (300 mg total) by mouth 2 (two) times daily for 5 days.   No facility-administered encounter medications on file as  of 05/01/2020.    Allergies (verified) Patient has no known allergies.   History: Past Medical History:  Diagnosis Date  . Basal cell carcinoma    Bilateral Legs  . CAD (coronary artery disease)    a.  stent to the RCA in 2001;  b.  Negative Myoview in January 2012;  c.  LHC 4/16:  dLM 20, small D1 50-70, D2 sub-total, oLCx 50-70, RCA stent ok, dPLB 90, EF 35-40% >> DES to D2  . Depression    pt denies   . History of echocardiogram    a.  Echo 4/16: Mild LVH, EF 88-89%, grade 1 diastolic dysfunction, normal wall motion, MAC  . HLD (hyperlipidemia)   . HTN (hypertension)   . MS (multiple sclerosis) ([place])   . OSA (obstructive sleep apnea)     no cpap   . Pre-diabetes   . Vertigo    Past Surgical History:  Procedure Laterality Date  . BIOPSY  07/26/2019   Procedure: BIOPSY;  Surgeon: Gatha Mayer, MD;  Location: [place];  Service: Endoscopy;;  . CATARACT EXTRACTION, BILATERAL    . COLON RESECTION SIGMOID N/A 08/30/2019   Procedure: SIGMOID COLON RESECTION;  Surgeon: Clovis Riley, MD;  Location: [place];  Service: General;  Laterality: N/A;  . COLONOSCOPY N/A 07/26/2019   Procedure: COLONOSCOPY;  Surgeon: Gatha Mayer, MD;  Location[facility] ENDOSCOPY;  Service: Endoscopy;  Laterality: N/A;  . COLONOSCOPY    . COLOSTOMY N/A 08/30/2019   Procedure: COLOSTOMY;  Surgeon: Clovis Riley, MD;  Location: [place];  Service: General;  Laterality: N/A;  . COLOSTOMY TAKEDOWN N/A 03/07/2020   Procedure: LAPAROSCOPIC COLOSTOMY REVERSAL, RIGID PROCTOSCOPY;  Surgeon: Clovis Riley, MD;  Location: [facility];  Service: General;  Laterality: N/A;  . CORONARY STENT PLACEMENT  2001    RCA  . LEFT HEART CATHETERIZATION WITH CORONARY ANGIOGRAM N/A 02/13/2012   Procedure: LEFT HEART CATHETERIZATION WITH CORONARY ANGIOGRAM;  Surgeon: [name], MD;  Location: [facility] CATH LAB;  Service: Cardiovascular;  Laterality: N/A;  . LEFT HEART CATHETERIZATION WITH CORONARY ANGIOGRAM N/A 04/24/2014    Procedure: LEFT HEART CATHETERIZATION WITH CORONARY ANGIOGRAM;  Surgeon: [name], MD;  [facility] CATH LAB;  Service: Cardiovascular;  Laterality: N/A;  . LYSIS OF ADHESION N/A 03/07/2020   Procedure: LYSIS OF ADHESION;  Surgeon: Clovis Riley, MD;  Location: [facility];  Service: General;  Laterality: N/A;  . TONSILLECTOMY     Family History  Problem Relation Age of Onset  . Cirrhosis Mother   . Alcohol abuse Mother   . Pancreatic cancer Mother   . Heart disease Father   . Hypertension Father   . Peripheral vascular disease Father   . Coronary artery disease Father   . Depression Sister   . Heart attack Neg Hx   . Stroke Neg Hx   . Stomach cancer Neg Hx   . Colon cancer Neg Hx   . Esophageal cancer Neg Hx    Social History   Socioeconomic History  . Marital status: Divorced  Spouse name: Not on file  . Number of children: Not on file  . Years of education: Not on file  . Highest education level: Not on file  Occupational History  . Not on file  Tobacco Use  . Smoking status: Former Smoker    Packs/day: 1.00    Years: 25.00    Pack years: 25.00    Types: Cigarettes    Quit date: 01/14/2000    Years since quitting: 20.3  . Smokeless tobacco: Never Used  Vaping Use  . Vaping Use: Never used  Substance and Sexual Activity  . Alcohol use: Yes    Alcohol/week: 3.0 standard drinks    Types: 1 Cans of beer, 2 Shots of liquor per week    Comment: 4-5 drinks per week   . Drug use: No  . Sexual activity: Not Currently  Other Topics Concern  . 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   Right handed   Drinks caffeine   Single story home   Social Determinants of Health   Financial Resource Strain: Low Risk   . Difficulty of Paying Living Expenses: Not hard at all  Food Insecurity: No Food Insecurity  . Worried About Charity fundraiser in the Last Year:  Never true  . Ran Out of Food in the Last Year: Never true  Transportation Needs: No Transportation Needs  . Lack of Transportation (Medical): No  . Lack of Transportation (Non-Medical): No  Physical Activity: Insufficiently Active  . Days of Exercise per Week: 2 days  . Minutes of Exercise per Session: 60 min  Stress: No Stress Concern Present  . Feeling of Stress : Not at all  Social Connections: Unknown  . Frequency of Communication with Friends and Family: Not on file  . Frequency of Social Gatherings with Friends and Family: Twice a week  . Attends Religious Services: Never  . Active Member of Clubs or Organizations: No  . Attends Archivist Meetings: Never  . Marital Status: Divorced    Tobacco Counseling Counseling given: Not Answered   Clinical Intake:  Pre-visit preparation completed: Yes  Pain : No/denies pain     Nutritional Risks: None Diabetes: Yes CBG done?: No Did pt. bring in CBG monitor from home?: No  How often do you need to have someone help you when you read instructions, pamphlets, or other written materials from your doctor or pharmacy?: 1 - Never What is the last grade level you completed in school?: bachelors  Diabetic: Yes Nutrition Risk Assessment:  Has the patient had any N/V/D within the last 2 months?  No  Does the patient have any non-healing wounds?  No  Has the patient had any unintentional weight loss or weight gain?  No   Diabetes:  Is the patient diabetic?  Yes  If diabetic, was a CBG obtained today?  No  telephone visit  Did the patient bring in their glucometer from home?  No  telephone visit  How often do you monitor your CBG's? Does not check at home.   Financial Strains and Diabetes Management:  Are you having any financial strains with the device, your supplies or your medication? No .  Does the patient want to be seen by Chronic Care Management for management of their diabetes?  No  Would the patient like to  be referred to a Nutritionist  or for Diabetic Management?  No   Diabetic Exams:  Diabetic Eye Exam: Completed 03/13/2020 Diabetic Foot Exam: Overdue, Pt has been advised about the importance in completing this exam. Pt is scheduled for diabetic foot exam on 05/08/2020.   Interpreter Needed?: No  Information entered by :: CJohnson, LPN   Activities of Daily Living In your present state of health, do you have any difficulty performing the following activities: 05/01/2020 03/07/2020  Hearing? Tempie Donning  Comment wears hearing aids -  Vision? N N  Difficulty concentrating or making decisions? N N  Walking or climbing stairs? N N  Dressing or bathing? N N  Doing errands, shopping? N N  Preparing Food and eating ? N -  Using the Toilet? N -  In the past six months, have you accidently leaked urine? N -  Do you have problems with loss of bowel control? N -  Managing your Medications? N -  Managing your Finances? N -  Housekeeping or managing your Housekeeping? N -  Some recent data might be hidden    Patient Care Team: Jinny Sanders, MD as PCP - General (Family Medicine) Josue Hector, MD as PCP - Cardiology (Cardiology) Dannielle Karvonen, RN as Case Manager  Indicate any recent Medical Services you may have received from other than [facility] providers in the past year (date may be approximate).     Assessment:   This is a routine wellness examination for Sivan.  Hearing/Vision screen  Hearing Screening   _0  _1  _2  _3  _4  _5  _6  _7  _8   Right ear:           Left ear:           Vision Screening Comments: Patient gets annual eye exams   Dietary issues and exercise activities discussed: Current Exercise Habits: Structured exercise class, Type of exercise: Other - see comments;walking (physical therapy), Time (Minutes): 60, Frequency (Times/Week): 2, Weekly Exercise (Minutes/Week): 120, Intensity: Moderate, Exercise limited by: None identified  Goals    .  Improve my heart health: Coronary artery disease     Timeframe:  Long-Range Goal Priority:  High Start Date:  02/24/2020                          Expected End Date:  07/12/2020                    Follow Up Date 05/28/2020   -  Continue to watch for signs of a heart attack: chest pain or discomfort, feeling weak, lightheaded or faint, pain or discomfort in the jaw, neck or back, Pain or discomfort in one or both arms or shoulders, Shortness of breath. - Call 911 for severe symptoms -  Continue to follow up with your cardiologist as recommended and report any ongoing or new symptoms to your doctor as soon as possible.  -  Continue to take your medications as prescribed.  -  Continue to monitor and record your blood pressure at least every 2-3 weeks.  Take these reading to your provider appointment.    -  Continue to adhere to a heart healthy diet consisting of fruits, vegetables and whole grains - and low in saturated fat, cholesterol, sodium (salt) and added sugar. - Discuss adding exercise to your daily routine with your doctor Doristine Devoid job incorporating household chore of grass cutting into a weekly exercise)     Why is this important?    Lifestyle  changes are key to improving the blood flow to your heart. Think about the things you can change and set a goal to live healthy.   Remember, when the blood vessels to your heart start to get clogged you may not have any symptoms.   Over time, they can get worse.   Don't ignore the signs, like chest pain, and get help right away.         . Patient Stated     05/01/2020, I will continue physical therapy for 2 days a week for 1 hour.     . Patient will report having no falls     Timeframe:  Long-Range Goal Priority:  High Start Date:  02/24/2020                           Expected End Date: 07/12/2020                  Follow Up Date 05/28/2020   - keep your cell phone with me always - Now that you are cutting your grass (cut your grass in the  cool of the day and keep your cell phone with you at all times) - Keep walkways clear of clutter and avoid throw rugs or use non slip rugs - use assistive walking device as needed or as advised by your provider - use a nightlight in the bathroom/ room/ hallway - wear my glasses and/or hearing aid - attend physical therapy as advised  - Continue to follow up with the neurologist as advised.  - Wear secure fitting shoes at all times with ambulation  Why is this important?    Most falls happen when it is hard for you to walk safely. Your balance may be off because of an illness. You may have pain in your knees, hip or other joints.   You may be overly tired or taking medicines that make you sleepy. You may not be able to see or hear clearly.   Falls can lead to broken bones, bruises or other injuries.   There are things you can do to help prevent falling.         . Patient will report returning to his parttime job in 6 months     Timeframe:  Long-Range Goal Priority:  High Start Date:  02/24/2019                           Expected End Date: 08/25/2020                    Follow Up Date: 05/28/2020  - Call your provider office for new concerns or questions - talk with your employer about a plan to go back to work  - Follow up with your providers as recommended and discuss return to work strategy - Continue physical therapy treatment as recommended by your provider.     Why is this important?    You might be worried about going back to work after a heart attack.   You may wonder if you will have another heart attack if you do.    You might also think that you can't do what you used to do.   Going back to work is possible.    You, your doctor and employer will need to work together to figure how to make it okay for you.         Marland Kitchen  Track and Manage My Blood Pressure-Hypertension     Timeframe:  Long-Range Goal Priority:  Medium Start Date: 02/24/2020                             Expected End Date:   07/12/2020                    Follow Up Date 05/28/2020   -  Start checking your blood pressure at least 2 times per week and record results.  -  Continue to take medications as prescribed - Continue to attend scheduled provider appointments - Calls provider office for new concerns, questions, or BP outside discussed parameters - Follows a low sodium diet / DASH     Why is this important?    You won't feel high blood pressure, but it can still hurt your blood vessels.   High blood pressure can cause heart or kidney problems. It can also cause a stroke.   Making lifestyle changes like losing a little weight or eating less salt will help.   Checking your blood pressure at home and at different times of the day can help to control blood pressure.   If the doctor prescribes medicine remember to take it the way the doctor ordered.   Call the office if you cannot afford the medicine or if there are questions about it.           Depression Screen PHQ 2/9 Scores 05/01/2020 02/24/2020 09/16/2019 11/16/2018 11/06/2017 07/08/2016 06/01/2015  PHQ - 2 Score 0 0 1 0 0 0 0  PHQ- 9 Score 0 - - - 0 - -  Exception Documentation - - (No Data) - - - -    Fall Risk Fall Risk  05/01/2020 02/24/2020 02/09/2020 11/16/2018 11/06/2017  Falls in the past year? _0 0 No  Number falls in past yr: 0 0 0 - -  Injury with Fall? 0 0 0 - -  Risk for fall due to : Medication side effect;Impaired balance/gait - - - -  Follow up Falls evaluation completed;Falls prevention discussed - - - -    FALL RISK PREVENTION PERTAINING TO THE HOME:  Any stairs in or around the home? Yes  If so, are there any without handrails? No  Home free of loose throw rugs in walkways, pet beds, electrical cords, etc? Yes  Adequate lighting in your home to reduce risk of falls? Yes   ASSISTIVE DEVICES UTILIZED TO PREVENT FALLS:  Life alert? No  Use of a cane, walker or w/c? No  Grab bars in the bathroom? No   Shower chair or bench in shower? No  Elevated toilet seat or a handicapped toilet? No   TIMED UP AND GO:  Was the test performed? N/A telephone visit .   Cognitive Function: MMSE - Mini Mental State Exam 05/01/2020 11/06/2017 11/06/2017 07/08/2016  Not completed: Refused - - -  Orientation to time - _1 Orientation to Place - _2 Registration - - 3 3  Attention/ Calculation - - 0 0  Recall - - 3 3  Language- name 2 objects - - 0 0  Language- repeat - - 1 1  Language- follow 3 step command - - 3 3  Language- read & follow direction - - 0 0  Write a sentence - - 0 0  Copy design - - 0 0  Total score - -  20 20  Mini Cog  Mini-Cog screen was not completed. Patient refused. Maximum score is 22. A value of 0 denotes this part of the MMSE was not completed or the patient failed this part of the Mini-Cog screening.       Immunizations Immunization History  Administered Date(s) Administered  . Fluad Quad(high Dose 65+) 09/13/2018  . Influenza, High Dose Seasonal PF 10/08/2016, 09/16/2017, 09/29/2019  . Influenza-Unspecified 11/13/2012, 09/14/2015  . Moderna Sars-Covid-2 Vaccination 12/05/2019  . PFIZER(Purple Top)SARS-COV-2 Vaccination 03/08/2019, 03/29/2019  . Pneumococcal Conjugate-13 06/01/2015, 08/09/2018  . Pneumococcal Polysaccharide-23 07/08/2016  . Zoster Recombinat (Shingrix) 06/14/2019, 09/29/2019    TDAP status: Due, Education has been provided regarding the importance of this vaccine. Advised may receive this vaccine at local pharmacy or Health Dept. Aware to provide a copy of the vaccination record if obtained from local pharmacy or Health Dept. Verbalized acceptance and understanding.  Flu Vaccine status: Up to date  Pneumococcal vaccine status: Up to date  Covid-19 vaccine status: Completed vaccines  Qualifies for Shingles Vaccine? Yes   Zostavax completed No   Shingrix Completed?: Yes  Screening Tests Health Maintenance  Topic Date Due  . URINE  MICROALBUMIN  Never done  . FOOT EXAM  11/16/2019  . TETANUS/TDAP  05/02/2023 (Originally 02/10/1964)  . COVID-19 Vaccine (4 - Booster) 06/03/2020  . INFLUENZA VACCINE  08/13/2020  . HEMOGLOBIN A1C  08/28/2020  . OPHTHALMOLOGY EXAM  03/13/2021  . COLONOSCOPY (Pts 45-69yr Insurance coverage will need to be confirmed)  07/25/2029  . Hepatitis C Screening  Completed  . PNA vac Low Risk Adult  Completed  . HPV VACCINES  Aged Out    Health Maintenance  Health Maintenance Due  Topic Date Due  . URINE MICROALBUMIN  Never done  . FOOT EXAM  11/16/2019    Colorectal cancer screening: Type of screening: Colonoscopy. Completed 07/26/2019. Repeat every 10 years  Lung Cancer Screening: (Low Dose CT Chest recommended if Age 74-80years, 30 pack-year currently smoking OR have quit w/in 15years.) does not qualify.   Additional Screening:  Hepatitis C Screening: does qualify; Completed 07/26/2019  Vision Screening: Recommended annual ophthalmology exams for early detection of glaucoma and other disorders of the eye. Is the patient up to date with their annual eye exam?  Yes  Who is the provider or what is the name of the office in which the patient attends annual eye exams? [facility], Dr WLatanya Presser If pt is not established with a provider, would they like to be referred to a provider to establish care? No .   Dental Screening: Recommended annual dental exams for proper oral hygiene  Community Resource Referral / Chronic Care Management: CRR required this visit?  No   CCM required this visit?  No      Plan:     I have personally reviewed and noted the following in the patient's chart:   . Medical and social history . Use of alcohol, tobacco or illicit drugs  . Current medications and supplements . Functional ability and status . Nutritional status . Physical activity . Advanced directives . List of other physicians . Hospitalizations, surgeries, and ER visits in previous 12  months . Vitals . Screenings to include cognitive, depression, and falls . Referrals and appointments  In addition, I have reviewed and discussed with patient certain preventive protocols, quality metrics, and best practice recommendations. A written personalized care plan for preventive services as well as general preventive health recommendations were provided to patient.  Due to this being a telephonic visit, the after visit summary with patients personalized plan was offered to patient via office or my-chart. Patient preferred to pick up at office at next visit or via mychart.   Andrez Grime, LPN   [id number]

## 2020-05-01 NOTE — Patient Instructions (Signed)
Roy [name] , Thank you for taking time to come for your Medicare Wellness Visit. I appreciate your ongoing commitment to your health goals. Please review the following plan we discussed and let me know if I can assist you in the future.   Screening recommendations/referrals: Colonoscopy: Up to date, completed 07/26/2019, due 07/2029 Recommended yearly ophthalmology/optometry visit for glaucoma screening and checkup Recommended yearly dental visit for hygiene and checkup  Vaccinations: Influenza vaccine: Up to date, completed 09/29/2019, due 08/2020 Pneumococcal vaccine: Completed series Tdap vaccine: decline-insurance  Shingles vaccine: Completed series   Covid-19: Completed series  Advanced directives: Please bring a copy of your POA (Power of Attorney) and/or Living Will to your next appointment.   Conditions/risks identified: diabetes, hypertension  Next appointment: Follow up in one year for your annual wellness visit.   Preventive Care 74 Years and Older, Male Preventive care refers to lifestyle choices and visits with your health care provider that can promote health and wellness. What does preventive care include?  A yearly physical exam. This is also called an annual well check.  Dental exams once or twice a year.  Routine eye exams. Ask your health care provider how often you should have your eyes checked.  Personal lifestyle choices, including:  Daily care of your teeth and gums.  Regular physical activity.  Eating a healthy diet.  Avoiding tobacco and drug use.  Limiting alcohol use.  Practicing safe sex.  Taking low doses of aspirin every day.  Taking vitamin and mineral supplements as recommended by your health care provider. What happens during an annual well check? The services and screenings done by your health care provider during your annual well check will depend on your age, overall health, lifestyle risk factors, and family history of  disease. Counseling  Your health care provider may ask you questions about your:  Alcohol use.  Tobacco use.  Drug use.  Emotional well-being.  Home and relationship well-being.  Sexual activity.  Eating habits.  History of falls.  Memory and ability to understand (cognition).  Work and work Statistician. Screening  You may have the following tests or measurements:  Height, weight, and BMI.  Blood pressure.  Lipid and cholesterol levels. These may be checked every 5 years, or more frequently if you are over 66 years old.  Skin check.  Lung cancer screening. You may have this screening every year starting at age 64 if you have a 30-pack-year history of smoking and currently smoke or have quit within the past 15 years.  Fecal occult blood test (FOBT) of the stool. You may have this test every year starting at age 47.  Flexible sigmoidoscopy or colonoscopy. You may have a sigmoidoscopy every 5 years or a colonoscopy every 10 years starting at age 61.  Prostate cancer screening. Recommendations will vary depending on your family history and other risks.  Hepatitis C blood test.  Hepatitis B blood test.  Sexually transmitted disease (STD) testing.  Diabetes screening. This is done by checking your blood sugar (glucose) after you have not eaten for a while (fasting). You may have this done every 1-3 years.  Abdominal aortic aneurysm (AAA) screening. You may need this if you are a current or former smoker.  Osteoporosis. You may be screened starting at age 31 if you are at high risk. Talk with your health care provider about your test results, treatment options, and if necessary, the need for more tests. Vaccines  Your health care provider may recommend certain vaccines,  such as:  Influenza vaccine. This is recommended every year.  Tetanus, diphtheria, and acellular pertussis (Tdap, Td) vaccine. You may need a Td booster every 10 years.  Zoster vaccine. You may  need this after age 68.  Pneumococcal 13-valent conjugate (PCV13) vaccine. One dose is recommended after age 25.  Pneumococcal polysaccharide (PPSV23) vaccine. One dose is recommended after age 52. Talk to your health care provider about which screenings and vaccines you need and how often you need them. This information is not intended to replace advice given to you by your health care provider. Make sure you discuss any questions you have with your health care provider. Document Released: 01/26/2015 Document Revised: 09/19/2015 Document Reviewed: 10/31/2014 Elsevier Interactive Patient Education  2017 [place] Prevention in the Home Falls can cause injuries. They can happen to people of all ages. There are many things you can do to make your home safe and to help prevent falls. What can I do on the outside of my home?  Regularly fix the edges of walkways and driveways and fix any cracks.  Remove anything that might make you trip as you walk through a door, such as a raised step or threshold.  Trim any bushes or trees on the path to your home.  Use bright outdoor lighting.  Clear any walking paths of anything that might make someone trip, such as rocks or tools.  Regularly check to see if handrails are loose or broken. Make sure that both sides of any steps have handrails.  Any raised decks and porches should have guardrails on the edges.  Have any leaves, snow, or ice cleared regularly.  Use sand or salt on walking paths during winter.  Clean up any spills in your garage right away. This includes oil or grease spills. What can I do in the bathroom?  Use night lights.  Install grab bars by the toilet and in the tub and shower. Do not use towel bars as grab bars.  Use non-skid mats or decals in the tub or shower.  If you need to sit down in the shower, use a plastic, non-slip stool.  Keep the floor dry. Clean up any water that spills on the floor as soon as it  happens.  Remove soap buildup in the tub or shower regularly.  Attach bath mats securely with double-sided non-slip rug tape.  Do not have throw rugs and other things on the floor that can make you trip. What can I do in the bedroom?  Use night lights.  Make sure that you have a light by your bed that is easy to reach.  Do not use any sheets or blankets that are too big for your bed. They should not hang down onto the floor.  Have a firm chair that has side arms. You can use this for support while you get dressed.  Do not have throw rugs and other things on the floor that can make you trip. What can I do in the kitchen?  Clean up any spills right away.  Avoid walking on wet floors.  Keep items that you use a lot in easy-to-reach places.  If you need to reach something above you, use a strong step stool that has a grab bar.  Keep electrical cords out of the way.  Do not use floor polish or wax that makes floors slippery. If you must use wax, use non-skid floor wax.  Do not have throw rugs and other things on  the floor that can make you trip. What can I do with my stairs?  Do not leave any items on the stairs.  Make sure that there are handrails on both sides of the stairs and use them. Fix handrails that are broken or loose. Make sure that handrails are as long as the stairways.  Check any carpeting to make sure that it is firmly attached to the stairs. Fix any carpet that is loose or worn.  Avoid having throw rugs at the top or bottom of the stairs. If you do have throw rugs, attach them to the floor with carpet tape.  Make sure that you have a light switch at the top of the stairs and the bottom of the stairs. If you do not have them, ask someone to add them for you. What else can I do to help prevent falls?  Wear shoes that:  Do not have high heels.  Have rubber bottoms.  Are comfortable and fit you well.  Are closed at the toe. Do not wear sandals.  If you  use a stepladder:  Make sure that it is fully opened. Do not climb a closed stepladder.  Make sure that both sides of the stepladder are locked into place.  Ask someone to hold it for you, if possible.  Clearly mark and make sure that you can see:  Any grab bars or handrails.  First and last steps.  Where the edge of each step is.  Use tools that help you move around (mobility aids) if they are needed. These include:  Canes.  Walkers.  Scooters.  Crutches.  Turn on the lights when you go into a dark area. Replace any light bulbs as soon as they burn out.  Set up your furniture so you have a clear path. Avoid moving your furniture around.  If any of your floors are uneven, fix them.  If there are any pets around you, be aware of where they are.  Review your medicines with your doctor. Some medicines can make you feel dizzy. This can increase your chance of falling. Ask your doctor what other things that you can do to help prevent falls. This information is not intended to replace advice given to you by your health care provider. Make sure you discuss any questions you have with your health care provider. Document Released: 10/26/2008 Document Revised: 06/07/2015 Document Reviewed: 02/03/2014 Elsevier Interactive Patient Education  2017 [employer].

## 2020-05-02 ENCOUNTER — Other Ambulatory Visit (INDEPENDENT_AMBULATORY_CARE_PROVIDER_SITE_OTHER): Payer: Medicare HMO

## 2020-05-02 ENCOUNTER — Other Ambulatory Visit: Payer: Self-pay

## 2020-05-02 DIAGNOSIS — M5459 Other low back pain: Secondary | ICD-10-CM | POA: Diagnosis not present

## 2020-05-02 DIAGNOSIS — E1165 Type 2 diabetes mellitus with hyperglycemia: Secondary | ICD-10-CM | POA: Diagnosis not present

## 2020-05-02 DIAGNOSIS — R262 Difficulty in walking, not elsewhere classified: Secondary | ICD-10-CM | POA: Diagnosis not present

## 2020-05-02 DIAGNOSIS — R2681 Unsteadiness on feet: Secondary | ICD-10-CM | POA: Diagnosis not present

## 2020-05-02 LAB — COMPREHENSIVE METABOLIC PANEL
ALT: 25 U/L (ref 0–53)
AST: 19 U/L (ref 0–37)
Albumin: 3.8 g/dL (ref 3.5–5.2)
Alkaline Phosphatase: 60 U/L (ref 39–117)
BUN: 17 mg/dL (ref 6–23)
CO2: 27 mEq/L (ref 19–32)
Calcium: 9.4 mg/dL (ref 8.4–10.5)
Chloride: 104 mEq/L (ref 96–112)
Creatinine, Ser: 1.09 mg/dL (ref 0.40–1.50)
GFR: 66.99 mL/min (ref >60.00)
Glucose, Bld: 146 mg/dL — ABNORMAL HIGH (ref 70–99)
Potassium: 4.5 mEq/L (ref 3.5–5.1)
Sodium: 138 mEq/L (ref 135–145)
Total Bilirubin: 0.4 mg/dL (ref 0.2–1.2)
Total Protein: 6.7 g/dL (ref 6.0–8.3)

## 2020-05-02 LAB — LIPID PANEL
Cholesterol: 283 mg/dL — ABNORMAL HIGH (ref 0–200)
HDL: 55 mg/dL (ref >39.00)
Total CHOL/HDL Ratio: 5
Triglycerides: 523 mg/dL — ABNORMAL HIGH (ref 0.0–149.0)

## 2020-05-02 LAB — LDL CHOLESTEROL, DIRECT: Direct LDL: 174 mg/dL

## 2020-05-02 LAB — HEMOGLOBIN A1C: Hgb A1c MFr Bld: 5.8 % (ref 4.6–6.5)

## 2020-05-03 NOTE — Progress Notes (Signed)
No critical labs need to be addressed urgently. We will discuss labs in detail at upcoming office visit.   

## 2020-05-04 DIAGNOSIS — M5459 Other low back pain: Secondary | ICD-10-CM | POA: Diagnosis not present

## 2020-05-04 DIAGNOSIS — R262 Difficulty in walking, not elsewhere classified: Secondary | ICD-10-CM | POA: Diagnosis not present

## 2020-05-04 DIAGNOSIS — R2681 Unsteadiness on feet: Secondary | ICD-10-CM | POA: Diagnosis not present

## 2020-05-08 ENCOUNTER — Other Ambulatory Visit: Payer: Self-pay

## 2020-05-08 ENCOUNTER — Ambulatory Visit (INDEPENDENT_AMBULATORY_CARE_PROVIDER_SITE_OTHER): Payer: Medicare HMO | Admitting: Family Medicine

## 2020-05-08 ENCOUNTER — Encounter: Payer: Self-pay | Admitting: Family Medicine

## 2020-05-08 VITALS — BP 130/82 | HR 89 | Temp 98.2°F | Ht 65.5 in | Wt 187.5 lb

## 2020-05-08 DIAGNOSIS — F331 Major depressive disorder, recurrent, moderate: Secondary | ICD-10-CM

## 2020-05-08 DIAGNOSIS — E785 Hyperlipidemia, unspecified: Secondary | ICD-10-CM

## 2020-05-08 DIAGNOSIS — Z0000 Encounter for general adult medical examination without abnormal findings: Secondary | ICD-10-CM | POA: Diagnosis not present

## 2020-05-08 DIAGNOSIS — R2689 Other abnormalities of gait and mobility: Secondary | ICD-10-CM | POA: Diagnosis not present

## 2020-05-08 DIAGNOSIS — M48061 Spinal stenosis, lumbar region without neurogenic claudication: Secondary | ICD-10-CM | POA: Diagnosis not present

## 2020-05-08 DIAGNOSIS — G35 Multiple sclerosis: Secondary | ICD-10-CM

## 2020-05-08 DIAGNOSIS — E1165 Type 2 diabetes mellitus with hyperglycemia: Secondary | ICD-10-CM

## 2020-05-08 DIAGNOSIS — R69 Illness, unspecified: Secondary | ICD-10-CM | POA: Diagnosis not present

## 2020-05-08 DIAGNOSIS — I10 Essential (primary) hypertension: Secondary | ICD-10-CM

## 2020-05-08 DIAGNOSIS — M4800 Spinal stenosis, site unspecified: Secondary | ICD-10-CM | POA: Insufficient documentation

## 2020-05-08 MED ORDER — ATORVASTATIN CALCIUM 80 MG PO TABS: 80.0000 mg | ORAL_TABLET | Freq: Every day | ORAL | 3 refills | Status: DC

## 2020-05-08 NOTE — Assessment & Plan Note (Signed)
Poor control LDL off statin ( goal < 70 given CAD). Triglycerides high as well.   Restart atorvastatin 80 mg daily and re-eval abs in 3 months.

## 2020-05-08 NOTE — Assessment & Plan Note (Signed)
Stable, chronic.  Continue current medication.   Venlafaxine XL 112.5 mg daily

## 2020-05-08 NOTE — Assessment & Plan Note (Signed)
2022 MRI brain.Marland Kitchen slight progression seen compared to 2011. Not currently interested in medication to treat. He is not interested in returning to see Dr. Posey Pronto.

## 2020-05-08 NOTE — Progress Notes (Signed)
Patient ID: Roy [name], male    DOB: [birth year], 75 y.o.   MRN: [id number]  This visit was conducted in person.  BP 130/82   Pulse 89   Temp 98.2 F (36.8 C) (Temporal)   Ht 5' 5.5" (1.664 m)   Wt 187 lb 8 oz (85 kg)   SpO2 94%   BMI 30.73 kg/m    CC:  Chief Complaint  Patient presents with  . Annual Exam    Part 2    Subjective:   HPI: Roy [name] is a 75 y.o. male presenting on 05/08/2020 for Annual Exam (Part 2) The patient presents for complete physical and review of chronic health problems. He/She also has the following acute concerns today: continued balance issues and lower ext weakness.  The patient saw a LPN or RN for medicare wellness visit.  Prevention and wellness was reviewed in detail. Note reviewed and important notes copied below. Health Maintenance: Foot exam- due    Abnormal Screenings: none  05/08/20 Referred to neurology given leg weakness and balance issues and history of MS.  MRI lumbar: Moderate to severe left L2-3, bilateral L3-4, L4-5 neural foraminal narrowing. Mild spinal canal narrowing at the L2-L5 levels.  MRI brain : per Dr. Posey Pronto.. mild progression of MS Periventricular deep white matter hyperintensities, with progression since 2011. These findings could be seen with demyelinating disease or chronic microvascular ischemia.  Nerve testing 1.  nerve testing: Chronic L5 radiculopathy affecting bilateral lower extremities, mild. 2. There is no evidence of a sensorimotor polyneuropathy affecting the lower extremities He has had some improvement in  balance with PT... going 2 times a week.  He is more active, able to [place]. No falls. Does have some low back pain if pushes himself to much.  Diabetes:  Lab Results  Component Value Date   HGBA1C 5.8 05/02/2020  Using medications without difficulties: Hypoglycemic episodes: Hyperglycemic episodes: Feet problems:no ulcers Blood Sugars averaging: not  checking eye exam within last year:yes  Hypertension:    BP Readings from Last 3 Encounters:  05/08/20 130/82  04/13/20 140/82  03/10/20 (!) 162/99  Using medication without problems or lightheadedness: none Chest pain with exertion:none Edema:none Short of breath:none Average home BPs: Other issues:  Wt Readings from Last 3 Encounters:  05/08/20 187 lb 8 oz (85 kg)  04/13/20 186 lb (84.4 kg)  02/09/20 188 lb (85.3 kg)  Body mass index is 30.73 kg/m.    Elevated Cholesterol: Trig high.. LDL not at goal < 70   Not taking statin Lab Results  Component Value Date   CHOL 283 (H) 05/02/2020   HDL 55.00 05/02/2020   LDLCALC 65 03/26/2016   LDLDIRECT 174.0 05/02/2020   TRIG (H) 05/02/2020    523.0 Triglyceride is over 400; calculations on Lipids are invalid.   CHOLHDL 5 05/02/2020  Using medications without problems: Muscle aches:  Diet compliance: moderate Exercise:  increasing Other complaints: CAD 04/13/20 OV from Dr. Johnsie Cancel reviewed, no changes made   MDD:  Stable control on venlafaxine 75 +37.5 mg daily.   Had LBO/ colostomy reversal successfully in 02/2020  Patient Care Team: Roy Sanders, MD as PCP - General (Family Medicine) Roy Hector, MD as PCP - Cardiology (Cardiology) Dannielle Karvonen, RN as Case Manager        Relevant past medical, surgical, family and social history reviewed and updated as indicated. Interim medical history since our last visit reviewed. Allergies and medications reviewed and updated.  Outpatient Medications Prior to Visit  Medication Sig Dispense Refill  . acetaminophen (TYLENOL) 325 MG tablet Take 2 tablets (650 mg total) by mouth every 6 (six) hours as needed. 30 tablet 0  . carvedilol (COREG) 12.5 MG tablet TAKE 1 TABLET BY MOUTH TWICE A DAY 180 tablet 3  . clopidogrel (PLAVIX) 75 MG tablet TAKE 1 TABLET BY MOUTH EVERY DAY 90 tablet 2  . fluocinonide (LIDEX) 0.05 % external solution Apply 1 application topically at bedtime as  needed (psoriasis).    . hydrocortisone cream 1 % Apply 1 application topically 2 (two) times daily as needed for itching.    . melatonin 3 MG TABS tablet Take 6 mg by mouth at bedtime as needed (sleep).    . nitroGLYCERIN (NITROSTAT) 0.4 MG SL tablet Place 1 tablet (0.4 mg total) under the tongue every 5 (five) minutes x 3 doses as needed for chest pain. 30 tablet 0  . omeprazole (PRILOSEC) 20 MG capsule TAKE 1 CAPSULE BY MOUTH EVERY DAY 90 capsule 3  . oxyCODONE (OXY IR/ROXICODONE) 5 MG immediate release tablet TAKE 1 TABLET BY MOUTH EVERY 6 HOURS AS NEEDED FOR SEVERE PAIN NOT RELIEVED BY TYLENOL, IBUPROFEN, REST, OR ICE. 10 tablet 0  . triamcinolone (KENALOG) 0.1 % Apply 1 application topically 3 (three) times daily as needed (psoriasis).    . venlafaxine XR (EFFEXOR-XR) 37.5 MG 24 hr capsule TAKE ONE TABLET DAILY IN ADDITION TO 75 MG TABLETS 90 capsule 1  . venlafaxine XR (EFFEXOR-XR) 75 MG 24 hr capsule TAKE 1 CAPSULE (75 MG TOTAL) BY MOUTH DAILY WITH BREAKFAST. 90 capsule 1   No facility-administered medications prior to visit.     Per HPI unless specifically indicated in ROS section below Review of Systems  Constitutional: Negative for fatigue and fever.  HENT: Negative for ear pain.   Eyes: Negative for pain.  Respiratory: Negative for cough and shortness of breath.   Cardiovascular: Negative for chest pain, palpitations and leg swelling.  Gastrointestinal: Negative for abdominal pain.  Genitourinary: Negative for dysuria.  Musculoskeletal: Negative for arthralgias.  Neurological: Positive for weakness. Negative for dizziness, syncope, light-headedness, numbness and headaches.  Psychiatric/Behavioral: Negative for dysphoric mood.   Objective:  BP 130/82   Pulse 89   Temp 98.2 F (36.8 C) (Temporal)   Ht 5' 5.5" (1.664 m)   Wt 187 lb 8 oz (85 kg)   SpO2 94%   BMI 30.73 kg/m   Wt Readings from Last 3 Encounters:  05/08/20 187 lb 8 oz (85 kg)  04/13/20 186 lb (84.4 kg)   02/09/20 188 lb (85.3 kg)      Physical Exam Constitutional:      Appearance: He is well-developed.  HENT:     Head: Normocephalic.     Right Ear: Hearing normal.     Left Ear: Hearing normal.     Nose: Nose normal.  Neck:     Thyroid: No thyroid mass or thyromegaly.     Vascular: No carotid bruit.     Trachea: Trachea normal.  Cardiovascular:     Rate and Rhythm: Normal rate and regular rhythm.     Pulses: Normal pulses.     Heart sounds: Heart sounds not distant. No murmur heard. No friction rub. No gallop.      Comments: No peripheral edema Pulmonary:     Effort: Pulmonary effort is normal. No respiratory distress.     Breath sounds: Normal breath sounds.  Skin:    General: Skin is  warm and dry.     Findings: No rash.  Psychiatric:        Speech: Speech normal.        Behavior: Behavior normal.        Thought Content: Thought content normal.      Diabetic foot exam: Normal inspection No skin breakdown No calluses  Normal DP pulses Normal sensation to light touch and monofilament Nails normal      Results for orders placed or performed in visit on 05/02/20  Comprehensive metabolic panel  Result Value Ref Range   Sodium 138 135 - 145 mEq/L   Potassium 4.5 3.5 - 5.1 mEq/L   Chloride 104 96 - 112 mEq/L   CO2 27 19 - 32 mEq/L   Glucose, Bld 146 (H) 70 - 99 mg/dL   BUN 17 6 - 23 mg/dL   Creatinine, Ser 1.09 0.40 - 1.50 mg/dL   Total Bilirubin 0.4 0.2 - 1.2 mg/dL   Alkaline Phosphatase 60 39 - 117 U/L   AST 19 0 - 37 U/L   ALT 25 0 - 53 U/L   Total Protein 6.7 6.0 - 8.3 g/dL   Albumin 3.8 3.5 - 5.2 g/dL   GFR 66.99 >60.00 mL/min   Calcium 9.4 8.4 - 10.5 mg/dL  Lipid panel  Result Value Ref Range   Cholesterol 283 (H) 0 - 200 mg/dL   Triglycerides (H) 0.0 - 149.0 mg/dL    523.0 Triglyceride is over 400; calculations on Lipids are invalid.   HDL 55.00 >39.00 mg/dL   Total CHOL/HDL Ratio 5   Hemoglobin A1c  Result Value Ref Range   Hgb A1c MFr Bld 5.8  4.6 - 6.5 %  LDL cholesterol, direct  Result Value Ref Range   Direct LDL 174.0 mg/dL    This visit occurred during the SARS-CoV-2 public health emergency.  Safety protocols were in place, including screening questions prior to the visit, additional usage of staff PPE, and extensive cleaning of exam room while observing appropriate contact time as indicated for disinfecting solutions.   COVID 19 screen:  No recent travel or known exposure to COVID19 The patient denies respiratory symptoms of COVID 19 at this time. The importance of social distancing was discussed today.   Assessment and Plan  The patient's preventative maintenance and recommended screening tests for an annual wellness exam were reviewed in full today. Brought up to date unless services declined.  Counselled on the importance of diet, exercise, and its role in overall health and mortality. The patient's FH and SH was reviewed, including their home life, tobacco status, and drug and alcohol status.   Vaccines:Up to datefor flu,PCV 13/23 and zostavax.. RefusedTdap Colon: 07/2019 Dr. Carlean Purl no family history of colon cancer. Nonsmoker Hep C neg PSApreviously stable, not indicated after age 39. Father with prostate cancer.  Alcohol2 cocktails a day.  Problem List Items Addressed This Visit    Controlled type 2 diabetes mellitus with hyperglycemia ([place])    Diet controlled .      Relevant Medications   atorvastatin (LIPITOR) 80 MG tablet   Depression, major, recurrent (HCC)    Stable, chronic.  Continue current medication.   Venlafaxine XL 112.5 mg daily      Dyslipidemia    Poor control LDL off statin ( goal < 70 given CAD). Triglycerides high as well.   Restart atorvastatin 80 mg daily and re-eval abs in 3 months.      Relevant Medications   atorvastatin (LIPITOR) 80 MG  tablet   Essential hypertension    Stable, chronic.  Continue current medication.   Carvedilol 12.5 mg BID          Relevant Medications   atorvastatin (LIPITOR) 80 MG tablet   Imbalance    Most likely secondary to  Spinal stenosis , foot drop and less likely MS symptoms. No clear med SE.  Not clearly BPPV issue or vertigo but pt interested in Eply maneuver if indicated. If not improving with PT.. can refer to ENT for vertigo evaluation.       Personal history of multiple sclerosis ([place])     2022 MRI brain.Marland Kitchen slight progression seen compared to 2011. Not currently interested in medication to treat. He is not interested in returning to see Dr. Posey Pronto.      Spinal stenosis    Some low back pain and slight foot drop but improving with PT. Pt refuses referral for further eval and treat with back specialist.       Other Visit Diagnoses    Routine general medical examination at a health care facility    -  Primary       Eliezer Lofts, MD

## 2020-05-08 NOTE — Assessment & Plan Note (Addendum)
Some low back pain and slight foot drop but improving with PT. Pt refuses referral for further eval and treat with back specialist.

## 2020-05-08 NOTE — Assessment & Plan Note (Signed)
Diet controlled.  

## 2020-05-08 NOTE — Assessment & Plan Note (Signed)
Most likely secondary to  Spinal stenosis , foot drop and less likely MS symptoms. No clear med SE.  Not clearly BPPV issue or vertigo but pt interested in Eply maneuver if indicated. If not improving with PT.. can refer to ENT for vertigo evaluation.

## 2020-05-08 NOTE — Assessment & Plan Note (Signed)
Stable, chronic.  Continue current medication.   Carvedilol 12.5 mg BID

## 2020-05-08 NOTE — Patient Instructions (Addendum)
Restart atorvastatin 80 mg daily for cholesterol.. Can try to wean off omeprazole if able.. restart if heartburn occurs. Call if low back pain starting or new leg weakness.  If PT and home strengthening not helping with balance please call for possible ENT evaluation for inner ear issues.

## 2020-05-10 DIAGNOSIS — M5459 Other low back pain: Secondary | ICD-10-CM | POA: Diagnosis not present

## 2020-05-10 DIAGNOSIS — R2681 Unsteadiness on feet: Secondary | ICD-10-CM | POA: Diagnosis not present

## 2020-05-10 DIAGNOSIS — R262 Difficulty in walking, not elsewhere classified: Secondary | ICD-10-CM | POA: Diagnosis not present

## 2020-05-14 ENCOUNTER — Ambulatory Visit: Payer: Medicare HMO | Admitting: Neurology

## 2020-05-15 DIAGNOSIS — M5459 Other low back pain: Secondary | ICD-10-CM | POA: Diagnosis not present

## 2020-05-15 DIAGNOSIS — R262 Difficulty in walking, not elsewhere classified: Secondary | ICD-10-CM | POA: Diagnosis not present

## 2020-05-15 DIAGNOSIS — R2681 Unsteadiness on feet: Secondary | ICD-10-CM | POA: Diagnosis not present

## 2020-05-18 DIAGNOSIS — M5459 Other low back pain: Secondary | ICD-10-CM | POA: Diagnosis not present

## 2020-05-18 DIAGNOSIS — R2681 Unsteadiness on feet: Secondary | ICD-10-CM | POA: Diagnosis not present

## 2020-05-18 DIAGNOSIS — R262 Difficulty in walking, not elsewhere classified: Secondary | ICD-10-CM | POA: Diagnosis not present

## 2020-05-28 ENCOUNTER — Telehealth: Payer: Medicare HMO

## 2020-05-28 ENCOUNTER — Telehealth: Payer: Self-pay

## 2020-05-28 ENCOUNTER — Other Ambulatory Visit: Payer: Self-pay | Admitting: Cardiovascular Disease

## 2020-05-28 NOTE — Telephone Encounter (Signed)
  Care Management   Follow Up Note   05/28/2020 Name: Roy [name] MRN: [id number] DOB: October 29, 1945   Referred by: Jinny Sanders, MD Reason for referral : Chronic Care Management (CAD, HTN, Falls)   An unsuccessful telephone outreach was attempted today. The patient was referred to the case management team for assistance with care management and care coordination.   Follow Up Plan: A HIPPA compliant phone message was left for the patient providing contact information and requesting a return call.  The Case management team will outreach to the patient again over the next 2 weeks.   Quinn Plowman RN,BSN,CCM RN Case Manager [place]  [phone]

## 2020-06-01 DIAGNOSIS — R262 Difficulty in walking, not elsewhere classified: Secondary | ICD-10-CM | POA: Diagnosis not present

## 2020-06-01 DIAGNOSIS — M5459 Other low back pain: Secondary | ICD-10-CM | POA: Diagnosis not present

## 2020-06-01 DIAGNOSIS — R2681 Unsteadiness on feet: Secondary | ICD-10-CM | POA: Diagnosis not present

## 2020-06-05 DIAGNOSIS — M5459 Other low back pain: Secondary | ICD-10-CM | POA: Diagnosis not present

## 2020-06-05 DIAGNOSIS — R2681 Unsteadiness on feet: Secondary | ICD-10-CM | POA: Diagnosis not present

## 2020-06-05 DIAGNOSIS — R262 Difficulty in walking, not elsewhere classified: Secondary | ICD-10-CM | POA: Diagnosis not present

## 2020-06-12 DIAGNOSIS — R2681 Unsteadiness on feet: Secondary | ICD-10-CM | POA: Diagnosis not present

## 2020-06-12 DIAGNOSIS — R262 Difficulty in walking, not elsewhere classified: Secondary | ICD-10-CM | POA: Diagnosis not present

## 2020-06-12 DIAGNOSIS — M5459 Other low back pain: Secondary | ICD-10-CM | POA: Diagnosis not present

## 2020-06-14 ENCOUNTER — Other Ambulatory Visit: Payer: Self-pay | Admitting: Cardiovascular Disease

## 2020-06-15 MED ORDER — ATORVASTATIN CALCIUM 80 MG PO TABS: 80.0000 mg | ORAL_TABLET | Freq: Every day | ORAL | 3 refills | Status: DC

## 2020-06-23 ENCOUNTER — Other Ambulatory Visit: Payer: Self-pay | Admitting: Family Medicine

## 2020-07-05 DIAGNOSIS — E785 Hyperlipidemia, unspecified: Secondary | ICD-10-CM | POA: Diagnosis not present

## 2020-07-05 DIAGNOSIS — E669 Obesity, unspecified: Secondary | ICD-10-CM | POA: Diagnosis not present

## 2020-07-05 DIAGNOSIS — E119 Type 2 diabetes mellitus without complications: Secondary | ICD-10-CM | POA: Diagnosis not present

## 2020-07-05 DIAGNOSIS — R32 Unspecified urinary incontinence: Secondary | ICD-10-CM | POA: Diagnosis not present

## 2020-07-05 DIAGNOSIS — I10 Essential (primary) hypertension: Secondary | ICD-10-CM | POA: Diagnosis not present

## 2020-07-05 DIAGNOSIS — Z008 Encounter for other general examination: Secondary | ICD-10-CM | POA: Diagnosis not present

## 2020-07-05 DIAGNOSIS — R69 Illness, unspecified: Secondary | ICD-10-CM | POA: Diagnosis not present

## 2020-07-05 DIAGNOSIS — K219 Gastro-esophageal reflux disease without esophagitis: Secondary | ICD-10-CM | POA: Diagnosis not present

## 2020-07-05 DIAGNOSIS — N529 Male erectile dysfunction, unspecified: Secondary | ICD-10-CM | POA: Diagnosis not present

## 2020-07-05 DIAGNOSIS — I25119 Atherosclerotic heart disease of native coronary artery with unspecified angina pectoris: Secondary | ICD-10-CM | POA: Diagnosis not present

## 2020-07-05 DIAGNOSIS — Z933 Colostomy status: Secondary | ICD-10-CM | POA: Diagnosis not present

## 2020-07-13 ENCOUNTER — Telehealth: Payer: Self-pay | Admitting: *Deleted

## 2020-07-13 NOTE — Chronic Care Management (AMB) (Signed)
  Care Management   Note  07/13/2020 Name: [name] MRN: [id number] DOB: 01-Jul-1945  DASTAN KRIDER is a 74 y.o. year old male who is a primary care patient of [name], [name], MD and is actively engaged with the care management team. I reached out to [name] by phone today to assist with re-scheduling a follow up visit with the RN Case Manager  Follow up plan: Telephone appointment with care management team member scheduled for:07/20/2020  Julian Hy, [place], [place] Management  Direct Dial: [phone]

## 2020-07-20 ENCOUNTER — Telehealth: Payer: Medicare HMO

## 2020-07-23 ENCOUNTER — Ambulatory Visit (INDEPENDENT_AMBULATORY_CARE_PROVIDER_SITE_OTHER): Payer: Medicare HMO

## 2020-07-23 ENCOUNTER — Telehealth: Payer: Medicare HMO

## 2020-07-23 DIAGNOSIS — I10 Essential (primary) hypertension: Secondary | ICD-10-CM

## 2020-07-23 DIAGNOSIS — I2510 Atherosclerotic heart disease of native coronary artery without angina pectoris: Secondary | ICD-10-CM | POA: Diagnosis not present

## 2020-07-23 DIAGNOSIS — Z9181 History of falling: Secondary | ICD-10-CM

## 2020-07-24 NOTE — Patient Instructions (Signed)
Visit Information:  Thank you for taking the time to speak with me today  PATIENT GOALS:  Goals Addressed             This Visit's Progress    Improve my heart health: Coronary artery disease   On track    Timeframe:  Long-Range Goal Priority:  High Start Date:  02/24/2020                          Expected End Date:  09/12/2020                    Follow Up Date 09/11/2020  -  Continue to watch for signs of a heart attack: chest pain or discomfort, feeling weak, lightheaded or faint, pain or discomfort in the jaw, neck or back, Pain or discomfort in one or both arms or shoulders, Shortness of breath. - Call 911 for severe symptoms -  Continue to follow up with your cardiologist as recommended and report any ongoing or new symptoms to your doctor as soon as possible.  -  Continue to take your medications as prescribed.  -  Monitor your blood pressure at least 1-2 times per week.   Take these reading to your provider appointment.    -  Continue to adhere to a heart healthy diet consisting of fruits, vegetables and whole grains - and low in saturated fat, cholesterol, sodium (salt) and added sugar. - Discuss adding exercise to your daily routine with your doctor     Why is this important?   Lifestyle changes are key to improving the blood flow to your heart. Think about the things you can change and set a goal to live healthy.  Remember, when the blood vessels to your heart start to get clogged you may not have any symptoms.  Over time, they can get worse.  Don't ignore the signs, like chest pain, and get help right away.           Patient will report having no falls   On track    Timeframe:  Long-Range Goal Priority:  High Start Date:  02/24/2020                           Expected End Date: 09/12/2020               Follow Up Date 09/11/2020  - always keep your cell phone with you.  - Keep walkways clear of clutter and avoid throw rugs or use non slip rugs - Continue to use  assistive walking device as needed or as advised by your provider - use a nightlight in the bathroom/ room/ hallway - wear my glasses and/or hearing aid - Follow up with your providers as recommended.  - Wear secure fitting shoes at all times with ambulation - Check your blood pressure at least 1-2 times per week.    Why is this important?   Most falls happen when it is hard for you to walk safely. Your balance may be off because of an illness. You may have pain in your knees, hip or other joints.  You may be overly tired or taking medicines that make you sleepy. You may not be able to see or hear clearly.  Falls can lead to broken bones, bruises or other injuries.  There are things you can do to help prevent falling.  Patient will report returning to his parttime job in 6 months   On track    Timeframe:  Long-Range Goal Priority:  Medium Start Date:  02/24/2019                           Expected End Date: 09/12/2020                    Follow Up Date: 09/11/2020  - Call your provider office for new concerns or questions - talk with your employer about a plan to go back to work  - Follow up with your providers as recommended and discuss return to work strategy    Why is this important?   You might be worried about going back to work after a heart attack.  You may wonder if you will have another heart attack if you do.   You might also think that you can't do what you used to do.  Going back to work is possible.   You, your doctor and employer will need to work together to figure how to make it okay for you.           Track and Manage My Blood Pressure-Hypertension   On track    Timeframe:  Long-Range Goal Priority:  Medium Start Date: 02/24/2020                            Expected End Date:   09/12/2020                   Follow Up Date 09/11/2020   -  Start checking your blood pressure at least 1-2 times per week and record results.  -  Continue to take medications  as prescribed - Continue to attend scheduled provider appointments - Calls provider office for new concerns, questions, or BP outside discussed parameters - Follows a low sodium diet / DASH    Why is this important?   You won't feel high blood pressure, but it can still hurt your blood vessels.  High blood pressure can cause heart or kidney problems. It can also cause a stroke.  Making lifestyle changes like losing a little weight or eating less salt will help.  Checking your blood pressure at home and at different times of the day can help to control blood pressure.  If the doctor prescribes medicine remember to take it the way the doctor ordered.  Call the office if you cannot afford the medicine or if there are questions about it.              Patient verbalizes understanding of instructions provided today and agrees to view in [place].   The patient has been provided with contact information for the care management team and has been advised to call with any health related questions or concerns.  The care management team will reach out to the patient again over the next 45 days.   Quinn Plowman RN,BSN,CCM RN Case Manager [place]  [phone]

## 2020-07-24 NOTE — Chronic Care Management (AMB) (Signed)
Chronic Care Management   CCM RN Visit Note  07/24/2020 late entry for 07/23/2020 Name: Roy [name] MRN: [id number] DOB: [birth year]  Subjective: Roy [name] is a 75 y.o. year old male who is a primary care patient of [name], [name], MD. The care management team was consulted for assistance with disease management and care coordination needs.    Engaged with patient by telephone for follow up visit in response to provider referral for case management and/or care coordination services.   Consent to Services:  The patient was given information about Chronic Care Management services, agreed to services, and gave verbal consent prior to initiation of services.  Please see initial visit note for detailed documentation.   Patient agreed to services and verbal consent obtained.   Assessment: Review of patient past medical history, allergies, medications, health status, including review of consultants reports, laboratory and other test data, was performed as part of comprehensive evaluation and provision of chronic care management services.   SDOH (Social Determinants of Health) assessments and interventions performed:    CCM Care Plan  No Known Allergies  Outpatient Encounter Medications as of 07/23/2020  Medication Sig   acetaminophen (TYLENOL) 325 MG tablet Take 2 tablets (650 mg total) by mouth every 6 (six) hours as needed.   atorvastatin (LIPITOR) 80 MG tablet Take 1 tablet (80 mg total) by mouth daily.   carvedilol (COREG) 12.5 MG tablet TAKE 1 TABLET BY MOUTH TWICE A DAY   clopidogrel (PLAVIX) 75 MG tablet TAKE 1 TABLET BY MOUTH EVERY DAY   fluocinonide (LIDEX) 0.05 % external solution Apply 1 application topically at bedtime as needed (psoriasis).   hydrocortisone cream 1 % Apply 1 application topically 2 (two) times daily as needed for itching.   melatonin 3 MG TABS tablet Take 6 mg by mouth at bedtime as needed (sleep).   nitroGLYCERIN (NITROSTAT) 0.4 MG SL tablet Place  1 tablet (0.4 mg total) under the tongue every 5 (five) minutes x 3 doses as needed for chest pain.   omeprazole (PRILOSEC) 20 MG capsule TAKE 1 CAPSULE BY MOUTH EVERY DAY   triamcinolone (KENALOG) 0.1 % Apply 1 application topically 3 (three) times daily as needed (psoriasis).   venlafaxine XR (EFFEXOR-XR) 37.5 MG 24 hr capsule TAKE ONE TABLET DAILY IN ADDITION TO 75 MG TABLETS   venlafaxine XR (EFFEXOR-XR) 75 MG 24 hr capsule TAKE 1 CAPSULE BY MOUTH DAILY WITH BREAKFAST.   [DISCONTINUED] gabapentin (NEURONTIN) 300 MG capsule Take 1 capsule (300 mg total) by mouth 2 (two) times daily for 5 days.   No facility-administered encounter medications on file as of 07/23/2020.    Patient Active Problem List   Diagnosis Date Noted   Spinal stenosis 05/08/2020   Imbalance 05/08/2020   History of colostomy reversal 03/07/2020   Controlled type 2 diabetes mellitus with hyperglycemia ([place]) 07/23/2019   Insomnia 07/23/2019   Personal history of multiple sclerosis ([place]) 12/02/2018   Weakness 03/31/2017   Internal hemorrhoids with complication 56/31/4970   CAD, RCA PCI 2001, urgent Dx2 DES 04/24/14 04/24/2014   Unstable angina with ST elelvation and NSVT while on treadmill 04/24/14    History of basal cell carcinoma 03/07/2014   GERD (gastroesophageal reflux disease) 03/07/2014   Incomplete emptying of bladder 03/07/2014   Obstructive sleep apnea 08/20/2009   Dyslipidemia 11/09/2008   Depression, major, recurrent ([place]) 07/06/2007   Essential hypertension 07/06/2007   Coronary artery disease involving native coronary artery of native heart without angina pectoris 07/06/2007  Conditions to be addressed/monitored:CAD, HTN, and Falls  Care Plan : Fall Risk (Adult)  Updates made by Dannielle Karvonen, RN since 07/24/2020 12:00 AM   Problem: At risk for falls   Priority: High   Long-Range Goal: Absence of Fall and Fall-Related Injury   Start Date: 02/24/2020  Expected End Date: 09/12/2020  This  Visit's Progress: On track  Recent Progress: On track  Priority: Medium  Current Barriers:  Knowledge Deficits related to fall precautions in patient with symptoms of balance issues: Patient denies any falls since last outreach with RNCM. He states he feels he is getting better daily and that his balance is much better.  Patient reports he has stopped physical therapy because it was causing his back to hurt.   Clinical Goal(s):  Patient will demonstrate ongoing adherence to prescribed treatment plan for decreasing falls Patient will not experience additional falls Patient will verbalize understanding of plan for fall precautions:  Patient will attend all scheduled medical appointments  Interventions:  Collaboration with Jinny Sanders, MD regarding development and update of comprehensive plan of care as evidenced by provider attestation and co-signature Inter-disciplinary care team collaboration (see longitudinal plan of care) Provided verbal education re: Potential causes of falls and Fall prevention strategies:  Patient states he is not checking his blood pressure.  He states he is not having any problems.  Re-advised patient to monitor blood pressure at least 1-2 times per week and record.  Assessed for falls since last encounter. Patient denies any falls since last telephone outreach with [facility]. Assessed patients knowledge of fall risk prevention  Patient Goals/Self-Care Activities - always keep your cell phone with you.  - Keep walkways clear of clutter and avoid throw rugs or use non slip rugs - Continue to use assistive walking device as needed or as advised by your provider - use a nightlight in the bathroom/ room/ hallway - wear my glasses and/or hearing aid - Follow up with your providers as recommended.  - Wear secure fitting shoes at all times with ambulation - Check your blood pressure at least 1-2 times per week.  Follow Up Plan: The patient has been provided with contact  information for the care management team and has been advised to call with any health related questions or concerns.  The care management team will reach out to the patient again over the next 45 days.     Problem: Decrease quality of life   Priority: High   Long-Range Goal: Development of long term plan for return to work   Start Date: 03/21/2020  Expected End Date: 09/12/2020  This Visit's Progress: On track  Recent Progress: On track  Priority: Medium  Current Barriers Knowledge deficit related to long term plan for return to work : Patient states he has not returned to his job but states he continues to do better everyday. Patient states his balance issues are much improved. He states he discontinued physical therapy because it caused his back to hurt.  Clinical Goal(s):  Collaboration with Jinny Sanders, MD regarding development and update of comprehensive plan of care as evidenced by provider attestation and co-signature Inter-disciplinary care team collaboration (see longitudinal plan of care) patient will work with care management team to address care coordination and chronic disease management needs Allow patient opportunity to express thoughts about present/future circumstances Interventions:  Evaluation of current treatment plan related to self-management and patient's adherence to plan as established by provider. Collaboration with Jinny Sanders, MD regarding  development and update of comprehensive plan of care as evidenced by provider attestation       and co-signature Inter-disciplinary care team collaboration (see longitudinal plan of care) Discussed plans with patient for ongoing care management follow up and provided patient with direct contact information for care management team Advised client to discuss return to work plan with provider and employer.  Patient [place] Activities:  - Call your provider office for new concerns or questions - talk with your  employer about a plan to go back to work  - Follow up with your providers as recommended and discuss return to work strategy Follow Up Plan: The patient has been provided with contact information for the care management team and has been advised to call with any health related questions or concerns.  The care management team will reach out to the patient again over the next 45 days.     Care Plan : Coronary Artery Disease (Adult)  Updates made by Dannielle Karvonen, RN since 07/24/2020 12:00 AM   Problem: Knowledge deficit related to coronary artery disease management   Priority: Medium  Onset Date: 02/24/2020   Long-Range Goal: Disease Progression Prevented or Minimized   Start Date: 02/24/2020  Expected End Date: 09/12/2020  This Visit's Progress: On track  Recent Progress: On track  Priority: Medium  Current Barriers:  Knowledge deficit related to self management of coronary artery disease: Patient continues to report he is not checking his blood pressures. He states he is feeling better overall and is not having any issues.  Clinical Goal(s) related to coronary artery disease    patient will:  Work with the care management team to address educational, disease management, and care coordination needs  Begin or continue self health monitoring activities as directed Call provider office for new or worsened signs and symptoms: Chest pain, shortness of breath, weakness, light-headedness, nausea or cold sweat.  Call care management team with questions or concerns Keep follow up appointments with your providers   Eat a heart healthy diet consisting of fruits, vegetables and whole grains - and low in saturated fat, cholesterol, sodium (salt) and added suga Interventions related to coronary artery disease:  Evaluation of current treatment plans and patient's adherence to plan as established by provider: Re-advised patient to monitor his blood pressure 1-2 times per week and log.   Provided disease  specific education to patient Collaborated with appropriate clinical care team members regarding patient needs as needed. Patient Self Care Activities related to coronary artery disease:  -  Continue to watch for signs of a heart attack: chest pain or discomfort, feeling weak, lightheaded or faint, pain or discomfort in the jaw, neck or back, Pain or discomfort in one or both arms or shoulders, Shortness of breath. - Call 911 for severe symptoms -  Continue to follow up with your cardiologist as recommended and report any ongoing or new symptoms to your doctor as soon as possible.  -  Continue to take your medications as prescribed.  -  Monitor your blood pressure at least 1-2 times per week.   Take these reading to your provider appointment.    -  Continue to adhere to a heart healthy diet consisting of fruits, vegetables and whole grains - and low in saturated fat, cholesterol, sodium (salt) and added sugar. - Discuss adding exercise to your daily routine with your doctor  Follow Up: The patient has been provided with contact information for the care management team and has  been advised to call with any health related questions or concerns.  The care management team will reach out to the patient again over the next 45 days.      Care Plan : Hypertension (Adult)  Updates made by Dannielle Karvonen, RN since 07/24/2020 12:00 AM   Problem: Hypertension (Hypertension)   Priority: Medium   Long-Range Goal: Patient will verbalize blood pressure managed without hypertensive episodes.   Start Date: 02/24/2020  Expected End Date: 09/12/2020  Recent Progress: On track  Priority: Medium  Objective:  Last practice recorded BP readings:  BP Readings from Last 3 Encounters:  05/08/20 130/82  04/13/20 140/82  03/10/20 (!) 162/99  Current Barriers:  Knowledge Deficits related to basic understanding of hypertension self care management:  Patient continues to report he is not checking his blood  pressure. Case Manager Clinical Goal(s):  patient will attend all scheduled medical appointments patient will demonstrate improved adherence to prescribed treatment plan for hypertension as evidenced by taking all medications as prescribed, monitoring and recording blood pressure as directed, adhering to low sodium diet:  RNCM re-discussed importance of monitoring blood pressure.  Requested patient monitor and record blood pressures at least 1-2 times a week.   Interventions:  Collaboration with Jinny Sanders, MD regarding development and update of comprehensive plan of care as evidenced by provider attestation and co-signature Inter-disciplinary care team collaboration (see longitudinal plan of care) Reviewed medications with patient and discussed importance of compliance: Patient reports he is taking his medications as prescribed.  Discussed plans with patient for ongoing care management follow up and provided patient with direct contact information for care management team Advised patient, providing education and rationale, to monitor blood pressure daily and record, calling PCP for findings outside established parameters:   Patient Goals/Self-Care Activities -  Start checking your blood pressure at least 1-2 times per week and record results.  -  Continue to take medications as prescribed - Continue to attend scheduled provider appointments - Calls provider office for new concerns, questions, or BP outside discussed parameters - Follows a low sodium diet / DASH  Follow Up Plan: The patient has been provided with contact information for the care management team and has been advised to call with any health related questions or concerns.  The care management team will reach out to the patient again over the next 45 days.       Plan:The patient has been provided with contact information for the care management team and has been advised to call with any health related questions or concerns.  and  The care management team will reach out to the patient again over the next 45 days. Quinn Plowman RN,BSN,CCM RN Case Manager [place]  [phone]

## 2020-08-08 DIAGNOSIS — K432 Incisional hernia without obstruction or gangrene: Secondary | ICD-10-CM | POA: Diagnosis not present

## 2020-08-13 ENCOUNTER — Other Ambulatory Visit: Payer: Self-pay | Admitting: Surgery

## 2020-08-13 DIAGNOSIS — K432 Incisional hernia without obstruction or gangrene: Secondary | ICD-10-CM

## 2020-08-22 ENCOUNTER — Other Ambulatory Visit: Payer: Self-pay

## 2020-08-22 ENCOUNTER — Ambulatory Visit
Admission: RE | Admit: 2020-08-22 | Discharge: 2020-08-22 | Disposition: A | Discharge status: Home / Self Care | Payer: Medicare HMO | Source: Ambulatory Visit | Attending: Surgery | Admitting: Surgery

## 2020-08-22 DIAGNOSIS — I700 Atherosclerosis of aorta: Secondary | ICD-10-CM | POA: Diagnosis not present

## 2020-08-22 DIAGNOSIS — K432 Incisional hernia without obstruction or gangrene: Secondary | ICD-10-CM

## 2020-08-22 DIAGNOSIS — N281 Cyst of kidney, acquired: Secondary | ICD-10-CM | POA: Diagnosis not present

## 2020-08-22 DIAGNOSIS — Z9049 Acquired absence of other specified parts of digestive tract: Secondary | ICD-10-CM | POA: Diagnosis not present

## 2020-08-22 DIAGNOSIS — K439 Ventral hernia without obstruction or gangrene: Secondary | ICD-10-CM | POA: Diagnosis not present

## 2020-08-22 MED ORDER — IOPAMIDOL (ISOVUE-300) INJECTION 61%
100.0000 mL | Freq: Once | INTRAVENOUS | Status: AC | PRN
  Administered 2020-08-22: 100 mL via INTRAVENOUS

## 2020-08-30 ENCOUNTER — Telehealth: Payer: Self-pay | Admitting: *Deleted

## 2020-08-30 NOTE — Telephone Encounter (Signed)
   [facility] Pre-operative Risk Assessment    Patient Name: Roy [name]  DOB: 08/19/1945 MRN: [id number]  HEARTCARE STAFF:  - IMPORTANT!!!!!! Under Visit Info/Reason for Call, type in Other and utilize the format Clearance MM/DD/YY or Clearance TBD. Do not use dashes or single digits. - Please review there is not already an duplicate clearance open for this procedure. - If request is for dental extraction, please clarify the # of teeth to be extracted. - If the patient is currently at the dentist's office, call Pre-Op Callback Staff (MA/nurse) to input urgent request.  - If the patient is not currently in the dentist office, please route to the Pre-Op pool.  Request for surgical clearance:  What type of surgery is being performed?  LAPAROSCOPIC INCISIONAL HERNIA REPAIR  When is this surgery scheduled?  TBD  What type of clearance is required (medical clearance vs. Pharmacy clearance to hold med vs. Both)?  BOTH  Are there any medications that need to be held prior to surgery and how long?  PLAVIX   Practice name and name of physician performing surgery?  [facility] / DR. Kae Heller  What is the office phone number?  [phone]   7.   What is the office fax number?  [phone]  ATTN:  RAVEN   8.   Anesthesia type (None, local, MAC, general) ?  GENERAL   Jeanann Lewandowsky 08/30/2020, 2:18 PM  _________________________________________________________________   (provider comments below)

## 2020-08-31 NOTE — Telephone Encounter (Signed)
   Primary Cardiologist: Jenkins Rouge, MD  Chart reviewed as part of pre-operative protocol coverage. Given past medical history and time since last visit, based on ACC/AHA guidelines, EUFEMIO STFLEUR would be at acceptable risk for the planned procedure without further cardiovascular testing.   His RCRI is a class III risk 6.6% risk of major cardiac event.  He is able to complete greater than 4 METS of physical activity.  His Plavix may be held for 5 days prior to his procedure.  Please resume as soon as hemostasis is achieved.  Patient was advised that if he develops new symptoms prior to surgery to contact our office to arrange a follow-up appointment.  He verbalized understanding.  I will route this recommendation to the requesting party via Epic fax function and remove from pre-op pool.  Please call with questions.  Jossie Ng. Heli Dino NP-C    08/31/2020, 9:35 AM [place] [address] Office [phone] Fax [phone]

## 2020-09-11 ENCOUNTER — Telehealth: Payer: Medicare HMO

## 2020-09-12 NOTE — Progress Notes (Signed)
Please enter orders for PAT visit scheduled for 09-18-20.

## 2020-09-13 ENCOUNTER — Ambulatory Visit: Payer: Self-pay | Admitting: Surgery

## 2020-09-13 NOTE — H&P (View-Only) (Signed)
Surgical Evaluation Requesting provider: n/a  Chief Complaint: hernia  HPI: Patient is well-known to me following urgent Hartman's procedure for obstructing diverticular stricture in August 2021, followed by laparoscopic reversal on 03/07/2020 In general he has been doing well, but about a month ago noted a protrusion/swelling on the left lower abdomen just lateral to his colostomy scar.  It is not painful, and he denies any associated GI symptoms, but it is uncomfortable when the seatbelt pressed against this for example.    No Known Allergies  Past Medical History:  Diagnosis Date   Basal cell carcinoma    Bilateral Legs   CAD (coronary artery disease)    a.  stent to the RCA in 2001;  b.  Negative Myoview in January 2012;  c.  LHC 4/16:  dLM 20, small D1 50-70, D2 sub-total, oLCx 50-70, RCA stent ok, dPLB 90, EF 35-40% >> DES to D2   Depression    pt denies    History of echocardiogram    a.  Echo 4/16: Mild LVH, EF 55-60%, grade 1 diastolic dysfunction, normal wall motion, MAC   HLD (hyperlipidemia)    HTN (hypertension)    MS (multiple sclerosis) (HCC)    OSA (obstructive sleep apnea)     no cpap    Pre-diabetes    Vertigo     Past Surgical History:  Procedure Laterality Date   BIOPSY  07/26/2019   Procedure: BIOPSY;  Surgeon: Gessner, Carl E, MD;  Location: [facility];  Service: Endoscopy;;   CATARACT EXTRACTION, BILATERAL     COLON RESECTION SIGMOID N/A 08/30/2019   Procedure: SIGMOID COLON RESECTION;  Surgeon: Littie Chiem A, MD;  Location: [facility];  Service: General;  Laterality: N/A;   COLONOSCOPY N/A 07/26/2019   Procedure: COLONOSCOPY;  Surgeon: Gessner, Carl E, MD;  Location: [facility];  Service: Endoscopy;  Laterality: N/A;   COLONOSCOPY     COLOSTOMY N/A 08/30/2019   Procedure: COLOSTOMY;  Surgeon: Nicholous Girgenti A, MD;  Location: [facility];  Service: General;  Laterality: N/A;   COLOSTOMY TAKEDOWN N/A 03/07/2020   Procedure: LAPAROSCOPIC COLOSTOMY REVERSAL,  RIGID PROCTOSCOPY;  Surgeon: Jemya Depierro A, MD;  Location: [facility];  Service: General;  Laterality: N/A;   CORONARY STENT PLACEMENT  2001    RCA   LEFT HEART CATHETERIZATION WITH CORONARY ANGIOGRAM N/A 02/13/2012   Procedure: LEFT HEART CATHETERIZATION WITH CORONARY ANGIOGRAM;  Surgeon: Peter M Jordan, MD;  [facility]: [facility];  Service: Cardiovascular;  Laterality: N/A;   LEFT HEART CATHETERIZATION WITH CORONARY ANGIOGRAM N/A 04/24/2014   Procedure: LEFT HEART CATHETERIZATION WITH CORONARY ANGIOGRAM;  Surgeon: Jonathan J Berry, MD;  [facility]: [facility];  Service: Cardiovascular;  Laterality: N/A;   LYSIS OF ADHESION N/A 03/07/2020   Procedure: LYSIS OF ADHESION;  Surgeon: Rober Skeels A, MD;  Location: [facility];  Service: General;  Laterality: N/A;   TONSILLECTOMY      Family History  Problem Relation Age of Onset   Cirrhosis Mother    Alcohol abuse Mother    Pancreatic cancer Mother    Heart disease Father    Hypertension Father    Peripheral vascular disease Father    Coronary artery disease Father    Depression Sister    Heart attack Neg Hx    Stroke Neg Hx    Stomach cancer Neg Hx    Colon cancer Neg Hx    Esophageal cancer Neg Hx     Social History   Socioeconomic History     Marital status: Divorced    Spouse name: Not on file   Number of children: Not on file   Years of education: Not on file   Highest education level: Not on file  Occupational History   Not on file  Tobacco Use   Smoking status: Former    Packs/day: 1.00    Years: 25.00    Pack years: 25.00    Types: Cigarettes    Quit date: 01/14/2000    Years since quitting: 20.6   Smokeless tobacco: Never  Vaping Use   Vaping Use: Never used  Substance and Sexual Activity   Alcohol use: Yes    Alcohol/week: 3.0 standard drinks    Types: 1 Cans of beer, 2 Shots of liquor per week    Comment: 4-5 drinks per week    Drug use: No   Sexual activity: Not Currently  Other Topics Concern   Not on file   Social History Narrative   2 kids, 4 grandkids, separated   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   Right handed   Drinks caffeine   Single story home   Social Determinants of Health   Financial Resource Strain: Low Risk    Difficulty of Paying Living Expenses: Not hard at all  Food Insecurity: No Food Insecurity   Worried About Running Out of Food in the Last Year: Never true   Ran Out of Food in the Last Year: Never true  Transportation Needs: No Transportation Needs   Lack of Transportation (Medical): No   Lack of Transportation (Non-Medical): No  Physical Activity: Insufficiently Active   Days of Exercise per Week: 2 days   Minutes of Exercise per Session: 60 min  Stress: No Stress Concern Present   Feeling of Stress : Not at all  Social Connections: Unknown   Frequency of Communication with Friends and Family: Not on file   Frequency of Social Gatherings with Friends and Family: Twice a week   Attends Religious Services: Never   Active Member of Clubs or Organizations: No   Attends Club or Organization Meetings: Never   Marital Status: Divorced    Current Outpatient Medications on File Prior to Visit  Medication Sig Dispense Refill   acetaminophen (TYLENOL) 325 MG tablet Take 2 tablets (650 mg total) by mouth every 6 (six) hours as needed. 30 tablet 0   atorvastatin (LIPITOR) 80 MG tablet Take 1 tablet (80 mg total) by mouth daily. 90 tablet 3   carvedilol (COREG) 12.5 MG tablet TAKE 1 TABLET BY MOUTH TWICE A DAY 180 tablet 3   clopidogrel (PLAVIX) 75 MG tablet TAKE 1 TABLET BY MOUTH EVERY DAY 90 tablet 2   fluocinonide (LIDEX) 0.05 % external solution Apply 1 application topically at bedtime as needed (psoriasis).     hydrocortisone cream 1 % Apply 1 application topically 2 (two) times daily as needed for itching.     melatonin 3 MG TABS tablet Take 6 mg by mouth at bedtime as needed (sleep).     nitroGLYCERIN (NITROSTAT) 0.4 MG SL  tablet Place 1 tablet (0.4 mg total) under the tongue every 5 (five) minutes x 3 doses as needed for chest pain. 30 tablet 0   omeprazole (PRILOSEC) 20 MG capsule TAKE 1 CAPSULE BY MOUTH EVERY DAY 90 capsule 3   triamcinolone (KENALOG) 0.1 % Apply 1 application topically 3 (three) times daily as needed (psoriasis).     venlafaxine XR (EFFEXOR-XR) 37.5 MG 24   hr capsule TAKE ONE TABLET DAILY IN ADDITION TO 75 MG TABLETS 90 capsule 1   venlafaxine XR (EFFEXOR-XR) 75 MG 24 hr capsule TAKE 1 CAPSULE BY MOUTH DAILY WITH BREAKFAST. 90 capsule 1   [DISCONTINUED] gabapentin (NEURONTIN) 300 MG capsule Take 1 capsule (300 mg total) by mouth 2 (two) times daily for 5 days. 10 capsule 0   No current facility-administered medications on file prior to visit.    Review of Systems: a complete, 10pt review of systems was completed with pertinent positives and negatives as documented in the HPI  Physical Exam: Vitals:    08/08/20 1019  BP: 126/80  Pulse: 88  Temp: 36.8 C (98.2 F)  SpO2: 96%  Weight: 87.6 kg (193 lb 3.2 oz)  Height: 167.6 cm (5' 6")    Body mass index is 31.18 kg/m.   Alert, well-appearing Unlabored respirations Abdomen is soft, nontender, nondistended, incisions are all well-healed.  With the patient standing there is a protrusion in the left lower quadrant just lateral to the colostomy scar.  This reduces when he is supine, the fascial defect is not really palpable.   CBC Latest Ref Rng & Units 03/09/2020 03/08/2020 02/29/2020  WBC 4.0 - 10.5 K/uL 8.9 10.8(H) 6.4  Hemoglobin 13.0 - 17.0 g/dL 13.6 13.3 16.0  Hematocrit 39.0 - 52.0 % 40.8 39.3 44.8  Platelets 150 - 400 K/uL 141(L) 142(L) 197    CMP Latest Ref Rng & Units 05/02/2020 03/09/2020 03/08/2020  Glucose 70 - 99 mg/dL 146(H) 125(H) 134(H)  BUN 6 - 23 mg/dL 17 10 13  Creatinine 0.40 - 1.50 mg/dL 1.09 1.08 1.22  Sodium 135 - 145 mEq/L 138 142 141  Potassium 3.5 - 5.1 mEq/L 4.5 4.8 4.7  Chloride 96 - 112 mEq/L 104 105 106   CO2 19 - 32 mEq/L 27 27 26  Calcium 8.4 - 10.5 mg/dL 9.4 8.9 8.3(L)  Total Protein 6.0 - 8.3 g/dL 6.7 - -  Total Bilirubin 0.2 - 1.2 mg/dL 0.4 - -  Alkaline Phos 39 - 117 U/L 60 - -  AST 0 - 37 U/L 19 - -  ALT 0 - 53 U/L 25 - -    Lab Results  Component Value Date   INR 1.2 08/10/2019   INR 1.1 07/24/2019   INR 1.0 02/10/2012    Imaging: No results found.   A/P: Incisional hernia without obstruction or gangrene -     CT abdomen pelvis with contrast     Likely an incisional hernia although it is difficult to palpate the fascia to appreciate the defect size.  We will proceed with a CT scan to further evaluate and for surgical planning.  I will contact him once that is done.    Patient Active Problem List   Diagnosis Date Noted   Spinal stenosis 05/08/2020   Imbalance 05/08/2020   History of colostomy reversal 03/07/2020   Controlled type 2 diabetes mellitus with hyperglycemia (HCC) 07/23/2019   Insomnia 07/23/2019   Personal history of multiple sclerosis (HCC) 12/02/2018   Weakness 03/31/2017   Internal hemorrhoids with complication 05/25/2015   CAD, RCA PCI 2001, urgent Dx2 DES 04/24/14 04/24/2014   Unstable angina with ST elelvation and NSVT while on treadmill 04/24/14    History of basal cell carcinoma 03/07/2014   GERD (gastroesophageal reflux disease) 03/07/2014   Incomplete emptying of bladder 03/07/2014   Obstructive sleep apnea 08/20/2009   Dyslipidemia 11/09/2008   Depression, major, recurrent (HCC) 07/06/2007   Essential hypertension 07/06/2007     Coronary artery disease involving native coronary artery of native heart without angina pectoris 07/06/2007       Brinley Rosete, MD [facility]  See AMION to contact appropriate on-call provider   

## 2020-09-13 NOTE — H&P (Signed)
Surgical Evaluation Requesting provider: n/a  Chief Complaint: hernia  HPI: Patient is well-known to me following urgent Hartman's procedure for obstructing diverticular stricture in August 2021, followed by laparoscopic reversal on 03/07/2020 In general he has been doing well, but about a month ago noted a protrusion/swelling on the left lower abdomen just lateral to his colostomy scar.  It is not painful, and he denies any associated GI symptoms, but it is uncomfortable when the seatbelt pressed against this for example.    No Known Allergies  Past Medical History:  Diagnosis Date   Basal cell carcinoma    Bilateral Legs   CAD (coronary artery disease)    a.  stent to the RCA in 2001;  b.  Negative Myoview in January 2012;  c.  [place] 4/16:  dLM 20, small D1 50-70, D2 sub-total, oLCx 50-70, RCA stent ok, dPLB 90, EF 35-40% >> DES to D2   Depression    pt denies    History of echocardiogram    a.  Echo 4/16: Mild LVH, EF 81-01%, grade 1 diastolic dysfunction, normal wall motion, MAC   HLD (hyperlipidemia)    HTN (hypertension)    MS (multiple sclerosis) (HCC)    OSA (obstructive sleep apnea)     no cpap    Pre-diabetes    Vertigo     Past Surgical History:  Procedure Laterality Date   BIOPSY  07/26/2019   Procedure: BIOPSY;  Surgeon: Gatha Mayer, MD;  Location: [place];  Service: Endoscopy;;   CATARACT EXTRACTION, BILATERAL     COLON RESECTION SIGMOID N/A 08/30/2019   Procedure: SIGMOID COLON RESECTION;  Surgeon: Clovis Riley, MD;  Location: [place];  Service: General;  Laterality: N/A;   COLONOSCOPY N/A 07/26/2019   Procedure: COLONOSCOPY;  Surgeon: Gatha Mayer, MD;  Location: [facility] ENDOSCOPY;  Service: Endoscopy;  Laterality: N/A;   COLONOSCOPY     COLOSTOMY N/A 08/30/2019   Procedure: COLOSTOMY;  Surgeon: Clovis Riley, MD;  Location: [place];  Service: General;  Laterality: N/A;   COLOSTOMY TAKEDOWN N/A 03/07/2020   Procedure: LAPAROSCOPIC COLOSTOMY REVERSAL,  RIGID PROCTOSCOPY;  Surgeon: Clovis Riley, MD;  Location[facility];  Service: General;  Laterality: N/A;   CORONARY STENT PLACEMENT  2001    RCA   LEFT HEART CATHETERIZATION WITH CORONARY ANGIOGRAM N/A 02/13/2012   Procedure: LEFT HEART CATHETERIZATION WITH CORONARY ANGIOGRAM;  Surgeon: [name], MD;  [facility] CATH LAB;  Service: Cardiovascular;  Laterality: N/A;   LEFT HEART CATHETERIZATION WITH CORONARY ANGIOGRAM N/A 04/24/2014   Procedure: LEFT HEART CATHETERIZATION WITH CORONARY ANGIOGRAM;  Surgeon: [name], MD;  Location: [facility] LAB;  Service: Cardiovascular;  Laterality: N/A;   LYSIS OF ADHESION N/A 03/07/2020   Procedure: LYSIS OF ADHESION;  Surgeon: Clovis Riley, MD;  Location: [facility];  Service: General;  Laterality: N/A;   TONSILLECTOMY      Family History  Problem Relation Age of Onset   Cirrhosis Mother    Alcohol abuse Mother    Pancreatic cancer Mother    Heart disease Father    Hypertension Father    Peripheral vascular disease Father    Coronary artery disease Father    Depression Sister    Heart attack Neg Hx    Stroke Neg Hx    Stomach cancer Neg Hx    Colon cancer Neg Hx    Esophageal cancer Neg Hx     Social History   Socioeconomic History  Marital status: Divorced    Spouse name: Not on file   Number of children: Not on file   Years of education: Not on file   Highest education level: Not on file  Occupational History   Not on file  Tobacco Use   Smoking status: Former    Packs/day: 1.00    Years: 25.00    Pack years: 25.00    Types: Cigarettes    Quit date: 01/14/2000    Years since quitting: 20.6   Smokeless tobacco: Never  Vaping Use   Vaping Use: Never used  Substance and Sexual Activity   Alcohol use: Yes    Alcohol/week: 3.0 standard drinks    Types: 1 Cans of beer, 2 Shots of liquor per week    Comment: 4-5 drinks per week    Drug use: No   Sexual activity: Not Currently  Other Topics Concern   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   Right handed   Drinks caffeine   Single story home   Social Determinants of Health   Financial Resource Strain: Low Risk    Difficulty of Paying Living Expenses: Not hard at all  Food Insecurity: No Food Insecurity   Worried About Charity fundraiser in the Last Year: Never true   Arboriculturist in the Last Year: Never true  Transportation Needs: No Transportation Needs   Lack of Transportation (Medical): No   Lack of Transportation (Non-Medical): No  Physical Activity: Insufficiently Active   Days of Exercise per Week: 2 days   Minutes of Exercise per Session: 60 min  Stress: No Stress Concern Present   Feeling of Stress : Not at all  Social Connections: Unknown   Frequency of Communication with Friends and Family: Not on file   Frequency of Social Gatherings with Friends and Family: Twice a week   Attends Religious Services: Never   Marine scientist or Organizations: No   Attends Music therapist: Never   Marital Status: Divorced    Current Outpatient Medications on File Prior to Visit  Medication Sig Dispense Refill   acetaminophen (TYLENOL) 325 MG tablet Take 2 tablets (650 mg total) by mouth every 6 (six) hours as needed. 30 tablet 0   atorvastatin (LIPITOR) 80 MG tablet Take 1 tablet (80 mg total) by mouth daily. 90 tablet 3   carvedilol (COREG) 12.5 MG tablet TAKE 1 TABLET BY MOUTH TWICE A DAY 180 tablet 3   clopidogrel (PLAVIX) 75 MG tablet TAKE 1 TABLET BY MOUTH EVERY DAY 90 tablet 2   fluocinonide (LIDEX) 0.05 % external solution Apply 1 application topically at bedtime as needed (psoriasis).     hydrocortisone cream 1 % Apply 1 application topically 2 (two) times daily as needed for itching.     melatonin 3 MG TABS tablet Take 6 mg by mouth at bedtime as needed (sleep).     nitroGLYCERIN (NITROSTAT) 0.4 MG SL  tablet Place 1 tablet (0.4 mg total) under the tongue every 5 (five) minutes x 3 doses as needed for chest pain. 30 tablet 0   omeprazole (PRILOSEC) 20 MG capsule TAKE 1 CAPSULE BY MOUTH EVERY DAY 90 capsule 3   triamcinolone (KENALOG) 0.1 % Apply 1 application topically 3 (three) times daily as needed (psoriasis).     venlafaxine XR (EFFEXOR-XR) 37.5 MG 24  hr capsule TAKE ONE TABLET DAILY IN ADDITION TO 75 MG TABLETS 90 capsule 1   venlafaxine XR (EFFEXOR-XR) 75 MG 24 hr capsule TAKE 1 CAPSULE BY MOUTH DAILY WITH BREAKFAST. 90 capsule 1   [DISCONTINUED] gabapentin (NEURONTIN) 300 MG capsule Take 1 capsule (300 mg total) by mouth 2 (two) times daily for 5 days. 10 capsule 0   No current facility-administered medications on file prior to visit.    Review of Systems: a complete, 10pt review of systems was completed with pertinent positives and negatives as documented in the HPI  Physical Exam: Vitals:    08/08/20 1019  BP: 126/80  Pulse: 88  Temp: 36.8 C (98.2 F)  SpO2: 96%  Weight: 87.6 kg (193 lb 3.2 oz)  Height: 167.6 cm (_0 )    Body mass index is 31.18 kg/m.   Alert, well-appearing Unlabored respirations Abdomen is soft, nontender, nondistended, incisions are all well-healed.  With the patient standing there is a protrusion in the left lower quadrant just lateral to the colostomy scar.  This reduces when he is supine, the fascial defect is not really palpable.   CBC Latest Ref Rng & Units 03/09/2020 03/08/2020 02/29/2020  WBC 4.0 - 10.5 K/uL 8.9 10.8(H) 6.4  Hemoglobin 13.0 - 17.0 g/dL 13.6 13.3 16.0  Hematocrit 39.0 - 52.0 % 40.8 39.3 44.8  Platelets 150 - 400 K/uL 141(L) 142(L) 197    CMP Latest Ref Rng & Units 05/02/2020 03/09/2020 03/08/2020  Glucose 70 - 99 mg/dL 146(H) 125(H) 134(H)  BUN 6 - 23 mg/dL _1 Creatinine 0.40 - 1.50 mg/dL 1.09 1.08 1.22  Sodium 135 - 145 mEq/L 138 142 141  Potassium 3.5 - 5.1 mEq/L 4.5 4.8 4.7  Chloride 96 - 112 mEq/L 104 105 106   CO2 19 - 32 mEq/L _2 Calcium 8.4 - 10.5 mg/dL 9.4 8.9 8.3(L)  Total Protein 6.0 - 8.3 g/dL 6.7 - -  Total Bilirubin 0.2 - 1.2 mg/dL 0.4 - -  Alkaline Phos 39 - 117 U/L 60 - -  AST 0 - 37 U/L 19 - -  ALT 0 - 53 U/L 25 - -    Lab Results  Component Value Date   INR 1.2 08/10/2019   INR 1.1 07/24/2019   INR 1.0 02/10/2012    Imaging: No results found.   A/P: Incisional hernia without obstruction or gangrene -     CT abdomen pelvis with contrast     Likely an incisional hernia although it is difficult to palpate the fascia to appreciate the defect size.  We will proceed with a CT scan to further evaluate and for surgical planning.  I will contact him once that is done.    Patient Active Problem List   Diagnosis Date Noted   Spinal stenosis 05/08/2020   Imbalance 05/08/2020   History of colostomy reversal 03/07/2020   Controlled type 2 diabetes mellitus with hyperglycemia ([place]) 07/23/2019   Insomnia 07/23/2019   Personal history of multiple sclerosis ([place]) 12/02/2018   Weakness 03/31/2017   Internal hemorrhoids with complication 81/01/7508   CAD, RCA PCI 2001, urgent Dx2 DES 04/24/14 04/24/2014   Unstable angina with ST elelvation and NSVT while on treadmill 04/24/14    History of basal cell carcinoma 03/07/2014   GERD (gastroesophageal reflux disease) 03/07/2014   Incomplete emptying of bladder 03/07/2014   Obstructive sleep apnea 08/20/2009   Dyslipidemia 11/09/2008   Depression, major, recurrent ([place]) 07/06/2007   Essential hypertension 07/06/2007  Coronary artery disease involving native coronary artery of native heart without angina pectoris 07/06/2007       Romana Juniper, MD [facility]  See AMION to contact appropriate on-call provider

## 2020-09-14 NOTE — Progress Notes (Signed)
DUE TO COVID-19 ONLY ONE VISITOR IS ALLOWED TO COME WITH YOU AND STAY IN THE WAITING ROOM ONLY DURING PRE OP AND PROCEDURE DAY OF SURGERY. THE 1 VISITOR  MAY VISIT WITH YOU AFTER SURGERY IN YOUR PRIVATE ROOM DURING VISITING HOURS ONLY!  YOU NEED TO HAVE A COVID 19 TEST ON__9/08/2020 _____ '@_______'$ , THIS TEST MUST BE DONE BEFORE SURGERY,  COVID TESTING SITE IS AT [place]. PLEASE REMAIN IN YOUR CAR THIS IS A DRIVER UP TEST. AFTER YOUR COVID TEST PLEASE WEAR A MASK OUT IN PUBLIC AND SOCIAL DISTANCE AND [place] YOUR HANDS FREQUENTLY. PLEASE ASK ALL YOUR CLOSE CONTACTS TO WEAR A MASK OUT IN PUBLIC AND SOCIAL DISTANCE AND [place] HANDS FREQUENTLY ALSO.               Roy [name]  09/14/2020   Your procedure is scheduled on:                  09/24/2020   Report to [facility] Main  Entrance   Report to admitting at   1030 AM     Call this number if you have problems the morning of surgery [phone]    Remember: Do not eat food , candy gum or mints :After Midnight. You may have clear liquids from midnight until 0945am __    CLEAR LIQUID DIET   Foods Allowed                                                                       Coffee and tea, regular and decaf                              Plain Jell-O any favor except red or purple                                            Fruit ices (not with fruit pulp)                                      Iced Popsicles                                     Carbonated beverages, regular and diet                                    Cranberry, grape and apple juices Sports drinks like Gatorade Lightly seasoned clear broth or consume(fat free) Sugar   _____________________________________________________________________    BRUSH YOUR TEETH MORNING OF SURGERY AND RINSE YOUR MOUTH OUT, NO CHEWING GUM CANDY OR MINTS.     Take these medicines the morning of surgery with A SIP OF WATER:  Coreg, prilosec, effexor  DO NOT  TAKE ANY DIABETIC MEDICATIONS DAY OF YOUR SURGERY  You may not have any metal on your body including hair pins and              piercings  Do not wear jewelry, make-up, lotions, powders or perfumes, deodorant             Do not wear nail polish on your fingernails.  Do not shave  48 hours prior to surgery.              Men may shave face and neck.   Do not bring valuables to the hospital. [place].  Contacts, dentures or bridgework may not be worn into surgery.  Leave suitcase in the car. After surgery it may be brought to your room.     Patients discharged the day of surgery will not be allowed to drive home. IF YOU ARE HAVING SURGERY AND GOING HOME THE SAME DAY, YOU MUST HAVE AN ADULT TO DRIVE YOU HOME AND BE WITH YOU FOR 24 HOURS. YOU MAY GO HOME BY TAXI OR UBER OR ORTHERWISE, BUT AN ADULT MUST ACCOMPANY YOU HOME AND STAY WITH YOU FOR 24 HOURS.  Name and phone number of your driver:  Special Instructions: N/A              Please read over the following fact sheets you were given: _____________________________________________________________________  [facility] - Preparing for Surgery Before surgery, you can play an important role.  Because skin is not sterile, your skin needs to be as free of germs as possible.  You can reduce the number of germs on your skin by washing with CHG (chlorahexidine gluconate) soap before surgery.  CHG is an antiseptic cleaner which kills germs and bonds with the skin to continue killing germs even after washing. Please DO NOT use if you have an allergy to CHG or antibacterial soaps.  If your skin becomes reddened/irritated stop using the CHG and inform your nurse when you arrive at Short Stay. Do not shave (including legs and underarms) for at least 48 hours prior to the first CHG shower.  You may shave your face/neck. Please follow these instructions carefully:  1.  Shower with  CHG Soap the night before surgery and the  morning of Surgery.  2.  If you choose to wash your hair, wash your hair first as usual with your  normal  shampoo.  3.  After you shampoo, rinse your hair and body thoroughly to remove the  shampoo.                           4.  Use CHG as you would any other liquid soap.  You can apply chg directly  to the skin and wash                       Gently with a scrungie or clean washcloth.  5.  Apply the CHG Soap to your body ONLY FROM THE NECK DOWN.   Do not use on face/ open                           Wound or open sores. Avoid contact with eyes, ears mouth and genitals (private parts).  Wash face,  Genitals (private parts) with your normal soap.             6.  Wash thoroughly, paying special attention to the area where your surgery  will be performed.  7.  Thoroughly rinse your body with warm water from the neck down.  8.  DO NOT shower/wash with your normal soap after using and rinsing off  the CHG Soap.                9.  Pat yourself dry with a clean towel.            10.  Wear clean pajamas.            11.  Place clean sheets on your bed the night of your first shower and do not  sleep with pets. Day of Surgery : Do not apply any lotions/deodorants the morning of surgery.  Please wear clean clothes to the hospital/surgery center.  FAILURE TO FOLLOW THESE INSTRUCTIONS MAY RESULT IN THE CANCELLATION OF YOUR SURGERY PATIENT SIGNATURE_________________________________  NURSE SIGNATURE__________________________________  ________________________________________________________________________

## 2020-09-18 ENCOUNTER — Encounter (HOSPITAL_COMMUNITY): Payer: Self-pay

## 2020-09-18 ENCOUNTER — Encounter (HOSPITAL_COMMUNITY)
Admission: RE | Admit: 2020-09-18 | Discharge: 2020-09-18 | Disposition: A | Discharge status: Home / Self Care | Payer: Medicare HMO | Source: Ambulatory Visit | Attending: Surgery | Admitting: Surgery

## 2020-09-18 ENCOUNTER — Other Ambulatory Visit: Payer: Self-pay

## 2020-09-18 DIAGNOSIS — Z01818 Encounter for other preprocedural examination: Secondary | ICD-10-CM | POA: Insufficient documentation

## 2020-09-18 DIAGNOSIS — K432 Incisional hernia without obstruction or gangrene: Secondary | ICD-10-CM | POA: Insufficient documentation

## 2020-09-18 LAB — CBC WITH DIFFERENTIAL/PLATELET
Abs Immature Granulocytes: 0.05 10*3/uL (ref 0.00–0.07)
Basophils Absolute: 0.1 10*3/uL (ref 0.0–0.1)
Basophils Relative: 1 %
Eosinophils Absolute: 0.2 10*3/uL (ref 0.0–0.5)
Eosinophils Relative: 2 %
HCT: 44.3 % (ref 39.0–52.0)
Hemoglobin: 15.6 g/dL (ref 13.0–17.0)
Immature Granulocytes: 1 %
Lymphocytes Relative: 23 %
Lymphs Abs: 1.7 10*3/uL (ref 0.7–4.0)
MCH: 32.4 pg (ref 26.0–34.0)
MCHC: 35.2 g/dL (ref 30.0–36.0)
MCV: 92.1 fL (ref 80.0–100.0)
Monocytes Absolute: 0.8 10*3/uL (ref 0.1–1.0)
Monocytes Relative: 11 %
Neutro Abs: 4.7 10*3/uL (ref 1.7–7.7)
Neutrophils Relative %: 62 %
Platelets: 183 10*3/uL (ref 150–400)
RBC: 4.81 MIL/uL (ref 4.22–5.81)
RDW: 13.2 % (ref 11.5–15.5)
WBC: 7.4 10*3/uL (ref 4.0–10.5)
nRBC: 0 % (ref 0.0–0.2)

## 2020-09-18 LAB — BASIC METABOLIC PANEL
Anion gap: 9 (ref 5–15)
BUN: 14 mg/dL (ref 8–23)
CO2: 25 mmol/L (ref 22–32)
Calcium: 9.6 mg/dL (ref 8.9–10.3)
Chloride: 108 mmol/L (ref 98–111)
Creatinine, Ser: 1.03 mg/dL (ref 0.61–1.24)
GFR, Estimated: >60 mL/min (ref >60)
Glucose, Bld: 123 mg/dL — ABNORMAL HIGH (ref 70–99)
Potassium: 4.5 mmol/L (ref 3.5–5.1)
Sodium: 142 mmol/L (ref 135–145)

## 2020-09-18 LAB — HEMOGLOBIN A1C
Hgb A1c MFr Bld: 6.3 % — ABNORMAL HIGH (ref 4.8–5.6)
Mean Plasma Glucose: 134.11 mg/dL

## 2020-09-18 NOTE — Progress Notes (Addendum)
Anesthesia Review:  PCP: DR amy [name] LOV 05/08/20  Cardiologist : DR Jenkins Rouge  LOV 04/13/20  Clearance- Coletta Memos, NP- 08/30/20- Telephone encounter  Chest x-ray : EKG :09/18/20  Echo : 07/25/2019  Stress test:2018  Cardiac Cath :  Activity level: can do a flight of stairs withoiut difficulty  Sleep Study/ CPAP : has sleep apnea no cpap  Fasting Blood Sugar :      / Checks Blood Sugar -- times a day:   Blood Thinner/ Instructions /Last Dose: ASA / Instructions/ Last Dose :   Prediabetes Hgba1c-09/18/20- 6.3  Plavix- PT to stop 5 days prior to surgery per pt  AT preop appt blood pressure was 133/102 in left arm and 144/95 in right arm.  Pt denies any chest pain, shortness of breath, dizziness or lightheadedness.   Covid test on 09/20/2020

## 2020-09-19 NOTE — Anesthesia Preprocedure Evaluation (Addendum)
Anesthesia Evaluation  Patient identified by MRN, date of birth, ID band Patient awake    Reviewed: Allergy & Precautions, NPO status , Patient's Chart, lab work & pertinent test results  Airway Mallampati: II  TM Distance: >3 FB Neck ROM: Full    Dental  (+) Teeth Intact   Pulmonary sleep apnea , former smoker,    Pulmonary exam normal        Cardiovascular hypertension, Pt. on medications and Pt. on home beta blockers + CAD and + Cardiac Stents   Rhythm:Regular Rate:Normal     Neuro/Psych Depression  Neuromuscular disease (MS)    GI/Hepatic Neg liver ROS, GERD  Medicated,Incisional hernia    Endo/Other    Renal/GU negative Renal ROS  negative genitourinary   Musculoskeletal negative musculoskeletal ROS (+)   Abdominal (+)  Abdomen: soft.    Peds  Hematology negative hematology ROS (+)   Anesthesia Other Findings   Reproductive/Obstetrics                            Anesthesia Physical Anesthesia Plan  ASA: 3  Anesthesia Plan: General   Post-op Pain Management:    Induction: Intravenous  PONV Risk Score and Plan: 2 and Ondansetron, Dexamethasone and Treatment may vary due to age or medical condition  Airway Management Planned: Mask and Oral ETT  Additional Equipment: None  Intra-op Plan:   Post-operative Plan: Extubation in OR  Informed Consent: I have reviewed the patients History and Physical, chart, labs and discussed the procedure including the risks, benefits and alternatives for the proposed anesthesia with the patient or authorized representative who has indicated his/her understanding and acceptance.     Dental advisory given  Plan Discussed with: CRNA  Anesthesia Plan Comments: (See PAT note 09/18/2020, Konrad Felix Ward, PA-C Lab Results      Component                Value               Date                      WBC                      7.4                  09/18/2020                HGB                      15.6                09/18/2020                HCT                      44.3                09/18/2020                MCV                      92.1                09/18/2020                PLT  183                 09/18/2020           Lab Results      Component                Value               Date                      NA                       142                 09/18/2020                K                        4.5                 09/18/2020                CO2                      25                  09/18/2020                GLUCOSE                  123 (H)             09/18/2020                BUN                      14                  09/18/2020                CREATININE               1.03                09/18/2020                CALCIUM                  9.6                 09/18/2020                GFRNONAA                 >60                 09/18/2020                GFRAA                    >60                 09/06/2019           Per preoperative cardiology evaluation 08/31/2020, "Chart reviewed as part of pre-operative protocol coverage. Given past medical history and time since last visit, based on ACC/AHA guidelines, QUIRINO RENNA would be at acceptable risk for the planned procedure without further cardiovascular testing.  Echo 07/25/2019 1. Left ventricular ejection fraction, by estimation, is 70 to 75%. The  left ventricle has hyperdynamic function. The left ventricle has no  regional wall motion abnormalities. Left ventricular diastolic parameters  are consistent with Grade I diastolic  dysfunction (impaired relaxation).  2. Right ventricular systolic function is normal. The right ventricular  size is normal. There is normal pulmonary artery systolic pressure.  3. The mitral valve is normal in structure. Trivial mitral valve  regurgitation. No evidence of mitral stenosis.  4. The aortic  valve is tricuspid. Aortic valve regurgitation is not  visualized. No aortic stenosis is present.  5. The inferior vena cava is normal in size with greater than 50%  respiratory variability, suggesting right atrial pressure of 3 mmHg. )       Anesthesia Quick Evaluation

## 2020-09-19 NOTE — Progress Notes (Signed)
Anesthesia Chart Review   Case: [id number] Date/Time: 09/24/20 1230   Procedure: LAPAROSCOPIC INCISIONAL HERNIA REPAIR POSSIBLE OPEN   Anesthesia type: General   Pre-op diagnosis: INCISIONAL HERNIA   Location: [facility] ROOM 01 / [facility]   Surgeons: Clovis Riley, MD       DISCUSSION:75 y.o. former smoker with h/o HTN, OSA, CAD, incisional hernia scheduled for above procedure 09/24/2020 with Dr. Romana Juniper.   Per preoperative cardiology evaluation 08/31/2020, "Chart reviewed as part of pre-operative protocol coverage. Given past medical history and time since last visit, based on ACC/AHA guidelines, Roy [name] would be at acceptable risk for the planned procedure without further cardiovascular testing.    His RCRI is a class III risk 6.6% risk of major cardiac event.  He is able to complete greater than 4 METS of physical activity.   His Plavix may be held for 5 days prior to his procedure.  Please resume as soon as hemostasis is achieved."  BP Readings from Last 3 Encounters:  09/18/20 (!) 144/95  05/08/20 130/82  04/13/20 140/82   Evaluate blood pressure DOS.   VS: BP (!) 144/95   Pulse 84   Temp 36.8 C (Oral)   Resp 16   Ht _0  (1.676 m)   Wt 86.2 kg   SpO2 96%   BMI 30.67 kg/m   PROVIDERS: Jinny Sanders, MD is PCP   Jenkins Rouge, MD is Cardiologist  LABS: Labs reviewed: Acceptable for surgery. (all labs ordered are listed, but only abnormal results are displayed)  Labs Reviewed  BASIC METABOLIC PANEL - Abnormal; Notable for the following components:      Result Value   Glucose, Bld 123 (*)    All other components within normal limits  HEMOGLOBIN A1C - Abnormal; Notable for the following components:   Hgb A1c MFr Bld 6.3 (*)    All other components within normal limits  CBC WITH DIFFERENTIAL/PLATELET     IMAGES:   EKG: 09/18/2020 Rate 85 bpm  Sinus rhythm with Premature atrial complexes Left axis deviation Low voltage QRS Incomplete right  bundle branch block Inferior infarct , age undetermined Abnormal ECG  CV: Echo 07/25/2019  1. Left ventricular ejection fraction, by estimation, is 70 to 75%. The  left ventricle has hyperdynamic function. The left ventricle has no  regional wall motion abnormalities. Left ventricular diastolic parameters  are consistent with Grade I diastolic  dysfunction (impaired relaxation).   2. Right ventricular systolic function is normal. The right ventricular  size is normal. There is normal pulmonary artery systolic pressure.   3. The mitral valve is normal in structure. Trivial mitral valve  regurgitation. No evidence of mitral stenosis.   4. The aortic valve is tricuspid. Aortic valve regurgitation is not  visualized. No aortic stenosis is present.   5. The inferior vena cava is normal in size with greater than 50%  respiratory variability, suggesting right atrial pressure of 3 mmHg.   Stress Test 03/26/2016 The patient walked for 6:00 of a Bruce protocol. Peak HR of 157 which is 104% predicted maximal HR . There were no ST or T wave changes to suggest ischemia. Defect 1: There is a medium defect of moderate severity present in the basal inferior, basal inferolateral, mid inferior, mid inferolateral and apical inferior location. 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 Nuclear stress EF: 58%. The left ventricular ejection fraction is normal (55-65%). This is a low risk study. Past Medical History:  Diagnosis Date  Basal cell carcinoma    Bilateral Legs   CAD (coronary artery disease)    a.  stent to the RCA in 2001;  b.  Negative Myoview in January 2012;  c.  [place] 4/16:  dLM 20, small D1 50-70, D2 sub-total, oLCx 50-70, RCA stent ok, dPLB 90, EF 35-40% >> DES to D2   History of echocardiogram    a.  Echo 4/16: Mild LVH, EF 64-68%, grade 1 diastolic dysfunction, normal wall motion, MAC   HLD (hyperlipidemia)    HTN (hypertension)    MS (multiple sclerosis)  (HCC)    OSA (obstructive sleep apnea)     no cpap    Pre-diabetes    Vertigo     Past Surgical History:  Procedure Laterality Date   BIOPSY  07/26/2019   Procedure: BIOPSY;  Surgeon: Gatha Mayer, MD;  Location: [facility];  Service: Endoscopy;;   CATARACT EXTRACTION, BILATERAL     COLON RESECTION SIGMOID N/A 08/30/2019   Procedure: SIGMOID COLON RESECTION;  Surgeon: Clovis Riley, MD;  Location: [facility];  Service: General;  Laterality: N/A;   COLONOSCOPY N/A 07/26/2019   Procedure: COLONOSCOPY;  Surgeon: Gatha Mayer, MD;  Location[facility] ENDOSCOPY;  Service: Endoscopy;  Laterality: N/A;   COLONOSCOPY     COLOSTOMY N/A 08/30/2019   Procedure: COLOSTOMY;  Surgeon: Clovis Riley, MD;  Location: [facility];  Service: General;  Laterality: N/A;   COLOSTOMY TAKEDOWN N/A 03/07/2020   Procedure: LAPAROSCOPIC COLOSTOMY REVERSAL, RIGID PROCTOSCOPY;  Surgeon: Clovis Riley, MD;  Location: [facility];  Service: General;  Laterality: N/A;   CORONARY STENT PLACEMENT  2001    RCA   LEFT HEART CATHETERIZATION WITH CORONARY ANGIOGRAM N/A 02/13/2012   Procedure: LEFT HEART CATHETERIZATION WITH CORONARY ANGIOGRAM;  Surgeon: [name], MD;  Location: [facility] CATH LAB;  Service: Cardiovascular;  Laterality: N/A;   LEFT HEART CATHETERIZATION WITH CORONARY ANGIOGRAM N/A 04/24/2014   Procedure: LEFT HEART CATHETERIZATION WITH CORONARY ANGIOGRAM;  Surgeon: [name], MD;  Location: [facility] CATH LAB;  Service: Cardiovascular;  Laterality: N/A;   LYSIS OF ADHESION N/A 03/07/2020   Procedure: LYSIS OF ADHESION;  Surgeon: Clovis Riley, MD;  Location: [facility];  Service: General;  Laterality: N/A;   TONSILLECTOMY      MEDICATIONS:  acetaminophen (TYLENOL) 325 MG tablet   atorvastatin (LIPITOR) 80 MG tablet   carvedilol (COREG) 12.5 MG tablet   clopidogrel (PLAVIX) 75 MG tablet   feeding supplement (ENSURE IMMUNE HEALTH) LIQD   fluocinonide (LIDEX) 0.05 % external solution   hydrocortisone cream 1 %    nitroGLYCERIN (NITROSTAT) 0.4 MG SL tablet   omeprazole (PRILOSEC) 20 MG capsule   triamcinolone (KENALOG) 0.1 %   venlafaxine XR (EFFEXOR-XR) 37.5 MG 24 hr capsule   venlafaxine XR (EFFEXOR-XR) 75 MG 24 hr capsule   No current facility-administered medications for this encounter.     Konrad Felix Ward, PA-C [facility] Pre-Surgical Testing [phone]

## 2020-09-21 ENCOUNTER — Other Ambulatory Visit: Payer: Self-pay | Admitting: Surgery

## 2020-09-21 ENCOUNTER — Telehealth: Payer: Self-pay | Admitting: Family Medicine

## 2020-09-21 ENCOUNTER — Other Ambulatory Visit: Payer: Self-pay | Admitting: Cardiovascular Disease

## 2020-09-21 ENCOUNTER — Ambulatory Visit (INDEPENDENT_AMBULATORY_CARE_PROVIDER_SITE_OTHER): Payer: Medicare HMO

## 2020-09-21 DIAGNOSIS — I10 Essential (primary) hypertension: Secondary | ICD-10-CM

## 2020-09-21 DIAGNOSIS — I2510 Atherosclerotic heart disease of native coronary artery without angina pectoris: Secondary | ICD-10-CM

## 2020-09-21 DIAGNOSIS — Z9181 History of falling: Secondary | ICD-10-CM

## 2020-09-21 NOTE — Telephone Encounter (Signed)
Please schedule follow diabetes appointment with Dr. Diona Browner sometime around 11/07/20.

## 2020-09-21 NOTE — Chronic Care Management (AMB) (Signed)
Chronic Care Management   CCM RN Visit Note  09/21/2020 Name: Roy [name] MRN: [id number] DOB: 08/18/1945  Subjective: Roy [name] is a 75 y.o. year old male who is a primary care patient of [name], [name], MD. The care management team was consulted for assistance with disease management and care coordination needs.    Engaged with patient by telephone for follow up visit in response to provider referral for case management and/or care coordination services.   Consent to Services:  The patient was given information about Chronic Care Management services, agreed to services, and gave verbal consent prior to initiation of services.  Please see initial visit note for detailed documentation.   Patient agreed to services and verbal consent obtained.   Assessment: Review of patient past medical history, allergies, medications, health status, including review of consultants reports, laboratory and other test data, was performed as part of comprehensive evaluation and provision of chronic care management services.   SDOH (Social Determinants of Health) assessments and interventions performed:    CCM Care Plan  No Known Allergies  Outpatient Encounter Medications as of 09/21/2020  Medication Sig Note   acetaminophen (TYLENOL) 325 MG tablet Take 2 tablets (650 mg total) by mouth every 6 (six) hours as needed.    atorvastatin (LIPITOR) 80 MG tablet Take 1 tablet (80 mg total) by mouth daily. (Patient taking differently: Take 40 mg by mouth daily.)    carvedilol (COREG) 12.5 MG tablet TAKE 1 TABLET BY MOUTH TWICE A DAY    clopidogrel (PLAVIX) 75 MG tablet TAKE 1 TABLET BY MOUTH EVERY DAY    feeding supplement (ENSURE IMMUNE HEALTH) LIQD Take 237 mLs by mouth in the morning.    fluocinonide (LIDEX) 0.05 % external solution Apply 1 application topically at bedtime as needed (psoriasis).    hydrocortisone cream 1 % Apply 1 application topically 2 (two) times daily as needed for itching.     nitroGLYCERIN (NITROSTAT) 0.4 MG SL tablet Place 1 tablet (0.4 mg total) under the tongue every 5 (five) minutes x 3 doses as needed for chest pain.    omeprazole (PRILOSEC) 20 MG capsule TAKE 1 CAPSULE BY MOUTH EVERY DAY    triamcinolone (KENALOG) 0.1 % Apply 1 application topically 3 (three) times daily as needed (psoriasis).    venlafaxine XR (EFFEXOR-XR) 37.5 MG 24 hr capsule TAKE ONE TABLET DAILY IN ADDITION TO 75 MG TABLETS 09/14/2020: Take with 37.5 mg capsule for a total of 112.5 mg daily   venlafaxine XR (EFFEXOR-XR) 75 MG 24 hr capsule TAKE 1 CAPSULE BY MOUTH DAILY WITH BREAKFAST. 09/14/2020: Take with 37.5 mg capsule for a total of 112.5 mg daily   [DISCONTINUED] gabapentin (NEURONTIN) 300 MG capsule Take 1 capsule (300 mg total) by mouth 2 (two) times daily for 5 days.    No facility-administered encounter medications on file as of 09/21/2020.    Patient Active Problem List   Diagnosis Date Noted   Spinal stenosis 05/08/2020   Imbalance 05/08/2020   History of colostomy reversal 03/07/2020   Controlled type 2 diabetes mellitus with hyperglycemia ([place]) 07/23/2019   Insomnia 07/23/2019   Personal history of multiple sclerosis ([place]) 12/02/2018   Weakness 03/31/2017   Internal hemorrhoids with complication [id number]   CAD, RCA PCI 2001, urgent Dx2 DES 04/24/14 04/24/2014   Unstable angina with ST elelvation and NSVT while on treadmill 04/24/14    History of basal cell carcinoma 03/07/2014   GERD (gastroesophageal reflux disease) 03/07/2014   Incomplete emptying  of bladder 03/07/2014   Obstructive sleep apnea 08/20/2009   Dyslipidemia 11/09/2008   Depression, major, recurrent ([place]) 07/06/2007   Essential hypertension 07/06/2007   Coronary artery disease involving native coronary artery of native heart without angina pectoris 07/06/2007    Conditions to be addressed/monitored:CAD, HTN, and Falls  Care Plan : Fall Risk (Adult)  Updates made by Dannielle Karvonen, RN since 09/21/2020  12:00 AM     Problem: At risk for falls   Priority: High     Long-Range Goal: Absence of Fall and Fall-Related Injury   Start Date: 02/24/2020  Expected End Date: 12/12/2020  This Visit's Progress: On track  Recent Progress: On track  Priority: Medium  Note:   Current Barriers:  Knowledge Deficits related to fall precautions in patient with symptoms of balance issues: Patient denies any falls since last outreach with RNCM.    Clinical Goal(s):  Patient will demonstrate ongoing adherence to prescribed treatment plan for decreasing falls Patient will not experience additional falls Patient will verbalize understanding of plan for fall precautions:  Patient will attend all scheduled medical appointments  Interventions:  Collaboration with Jinny Sanders, MD regarding development and update of comprehensive plan of care as evidenced by provider attestation and co-signature Inter-disciplinary care team collaboration (see longitudinal plan of care) Provided verbal education re: Potential causes of falls and Fall prevention strategies:  Patient states he is not checking his blood pressure.  He states he is not having any problems.  Re-advised patient to monitor blood pressure at least 1-2 times per week and record.  Assessed for falls since last encounter. Assessed patients knowledge of fall risk prevention  Patient Goals/Self-Care Activities - always keep your cell phone with you.  - Keep walkways clear of clutter and avoid throw rugs or use non slip rugs - Continue to use assistive walking device as needed or as advised by your provider - use a nightlight in the bathroom/ room/ hallway - wear my glasses and/or hearing aid - Continue to follow up with your providers as recommended.  - Wear secure fitting shoes at all times with ambulation - Check your blood pressure at least 1-2 times per week.  Follow Up Plan: The patient has been provided with contact information for the care  management team and has been advised to call with any health related questions or concerns.  The care management team will reach out to the patient again over the next 45 days.      Problem: Decrease quality of life   Priority: High     Long-Range Goal: Development of long term plan for return to work   Start Date: 03/21/2020  Expected End Date: 12/12/2020  This Visit's Progress: On track  Recent Progress: On track  Priority: Medium  Note:   Current Barriers Knowledge deficit related to long term plan for return to work : Patient states he has not returned to his job but states he continues to do better everyday. Patient states his balance issues are much improved.  Clinical Goal(s):  Collaboration with Jinny Sanders, MD regarding development and update of comprehensive plan of care as evidenced by provider attestation and co-signature Inter-disciplinary care team collaboration (see longitudinal plan of care) patient will work with care management team to address care coordination and chronic disease management needs Allow patient opportunity to express thoughts about present/future circumstances Interventions:  Evaluation of current treatment plan related to self-management and patient's adherence to plan as established by provider. Collaboration with  Jinny Sanders, MD regarding development and update of comprehensive plan of care as evidenced by provider attestation       and co-signature Inter-disciplinary care team collaboration (see longitudinal plan of care) Discussed plans with patient for ongoing care management follow up and provided patient with direct contact information for care management team Advised client to discuss return to work plan with provider and employer.  Patient [place] Activities:  - Call your provider office for new concerns or questions - talk with your employer about a plan to go back to work  - Follow up with your providers as recommended and  discuss return to work strategy Follow Up Plan: The patient has been provided with contact information for the care management team and has been advised to call with any health related questions or concerns.  The care management team will reach out to the patient again over the next 45 days.      Care Plan : Coronary Artery Disease (Adult)  Updates made by Dannielle Karvonen, RN since 09/21/2020 12:00 AM     Problem: Knowledge deficit related to coronary artery disease management   Priority: Medium  Onset Date: 02/24/2020     Long-Range Goal: Disease Progression Prevented or Minimized   Start Date: 02/24/2020  Expected End Date: 12/12/2020  This Visit's Progress: On track  Recent Progress: On track  Priority: Medium  Note:   Current Barriers:  Knowledge deficit related to self management of coronary artery disease: Patient continues to report he is not checking his blood pressures.  He states, " I just for get to do it."   He denies any concerns. He states he continues to remain active by doing yard work.   Patient reports he is scheduled to have hernia surgery on 09/24/2020.    Clinical Goal(s) related to coronary artery disease    patient will:  Work with the care management team to address educational, disease management, and care coordination needs  Begin or continue self health monitoring activities as directed Call provider office for new or worsened signs and symptoms: Chest pain, shortness of breath, weakness, light-headedness, nausea or cold sweat.  Call care management team with questions or concerns Keep follow up appointments with your providers   Eat a heart healthy diet consisting of fruits, vegetables and whole grains - and low in saturated fat, cholesterol, sodium (salt) and added sugar Interventions related to coronary artery disease:  Evaluation of current treatment plans and patient's adherence to plan as established by provider: Encouraged patient to monitor blood  pressure at least 1 time per week.  Provided disease specific education to patient Collaborated with appropriate clinical care team members regarding patient needs as needed. Patient Self Care Activities related to coronary artery disease:  -  Continue to watch for signs of a heart attack: chest pain or discomfort, feeling weak, lightheaded or faint, pain or discomfort in the jaw, neck or back, Pain or discomfort in one or both arms or shoulders, Shortness of breath. - Call 911 for severe symptoms -  Continue to follow up with your cardiologist as recommended and report any ongoing or new symptoms to your doctor as soon as possible.  -  Continue to take your medications as prescribed and refill timely  -  Plan to monitor your blood pressure at least 1 time per week.   Take these reading to your provider appointment.    -  Continue to adhere to a heart healthy diet consisting of  fruits, vegetables and whole grains - and low in saturated fat, cholesterol, sodium (salt) and added sugar. - Discuss adding exercise to your daily routine with your doctor  Follow Up: The patient has been provided with contact information for the care management team and has been advised to call with any health related questions or concerns.  The care management team will reach out to the patient again over the next 45 days.      Care Plan : Hypertension (Adult)  Updates made by Dannielle Karvonen, RN since 09/21/2020 12:00 AM     Problem: Hypertension (Hypertension)   Priority: Medium     Long-Range Goal: Patient will verbalize blood pressure managed without hypertensive episodes.   Start Date: 02/24/2020  Expected End Date: 09/12/2020  Recent Progress: On track  Priority: Medium  Note:   Objective:  Last practice recorded BP readings:  BP Readings from Last 3 Encounters:  09/18/20 (!) 144/95  05/08/20 130/82  04/13/20 140/82  Current Barriers:  Knowledge Deficits related to basic understanding of hypertension  self care management:  Patient continues to report he is not checking his blood pressure. Case Manager Clinical Goal(s):  patient will attend all scheduled medical appointments patient will demonstrate improved adherence to prescribed treatment plan for hypertension as evidenced by taking all medications as prescribed, monitoring and recording blood pressure as directed, adhering to low sodium diet:  RNCM re-discussed importance of monitoring blood pressure.  Requested patient monitor and record blood pressures at least 1-2 times a week.   Interventions:  Collaboration with Jinny Sanders, MD regarding development and update of comprehensive plan of care as evidenced by provider attestation and co-signature Inter-disciplinary care team collaboration (see longitudinal plan of care) Reviewed medications with patient and discussed importance of compliance: Patient reports he is taking his medications as prescribed.  Discussed plans with patient for ongoing care management follow up and provided patient with direct contact information for care management team Advised patient, providing education and rationale, to monitor blood pressure daily and record, calling PCP for findings outside established parameters:   Patient Goals/Self-Care Activities -  Start checking your blood pressure at least 1-2 times per week and record results.  -  Continue to take medications as prescribed - Continue to attend scheduled provider appointments - Calls provider office for new concerns, questions, or BP outside discussed parameters - Follows a low sodium diet / DASH  Follow Up Plan: The patient has been provided with contact information for the care management team and has been advised to call with any health related questions or concerns.  The care management team will reach out to the patient again over the next 45 days.        Plan:The patient has been provided with contact information for the care management  team and has been advised to call with any health related questions or concerns.  and The care management team will reach out to the patient again over the next 45 days. Quinn Plowman RN,BSN,CCM RN Case Manager [place]  [phone]

## 2020-09-21 NOTE — Patient Instructions (Signed)
Visit Information:  Thank you for taking the time to speak with me today.   PATIENT GOALS:  Goals Addressed             This Visit's Progress    Improve my heart health: Coronary artery disease   On track    Timeframe:  Long-Range Goal Priority:  High Start Date:  02/24/2020                          Expected End Date:  12/12/2020                    Follow Up Date 10/18/2020  -  Continue to watch for signs of a heart attack: chest pain or discomfort, feeling weak, lightheaded or faint, pain or discomfort in the jaw, neck or back, Pain or discomfort in one or both arms or shoulders, Shortness of breath. - Call 911 for severe symptoms -  Continue to follow up with your cardiologist as recommended and report any ongoing or new symptoms to your doctor as soon as possible.  -  Continue to take your medications as prescribed and refill timely  -  Plan to monitor your blood pressure at least 1 time per week.   Take these reading to your provider appointment.    -  Continue to adhere to a heart healthy diet consisting of fruits, vegetables and whole grains - and low in saturated fat, cholesterol, sodium (salt) and added sugar. - Discuss adding exercise to your daily routine with your doctor     Why is this important?   Lifestyle changes are key to improving the blood flow to your heart. Think about the things you can change and set a goal to live healthy.  Remember, when the blood vessels to your heart start to get clogged you may not have any symptoms.  Over time, they can get worse.  Don't ignore the signs, like chest pain, and get help right away.          Patient will report having no falls   On track    Timeframe:  Long-Range Goal Priority:  High Start Date:  02/24/2020                           Expected End Date: 12/12/2020              Follow Up Date 10/18/2020  - always keep your cell phone with you.  - Keep walkways clear of clutter and avoid throw rugs or use non slip rugs -  Continue to use assistive walking device as needed or as advised by your provider - use a nightlight in the bathroom/ room/ hallway - wear my glasses and/or hearing aid - Continue to follow up with your providers as recommended.  - Wear secure fitting shoes at all times with ambulation - Check your blood pressure at least 1-2 times per week.    Why is this important?   Most falls happen when it is hard for you to walk safely. Your balance may be off because of an illness. You may have pain in your knees, hip or other joints.  You may be overly tired or taking medicines that make you sleepy. You may not be able to see or hear clearly.  Falls can lead to broken bones, bruises or other injuries.  There are things you can do to help prevent  falling.          Patient will report returning to his parttime job in 6 months   On track    Timeframe:  Long-Range Goal Priority:  Medium Start Date:  02/24/2019                           Expected End Date: 12/12/2020                   Follow Up Date: 10/18/2020  - Call your provider office for new concerns or questions - talk with your employer about a plan to go back to work  - Follow up with your providers as recommended and discuss return to work strategy    Why is this important?   You might be worried about going back to work after a heart attack.  You may wonder if you will have another heart attack if you do.   You might also think that you can't do what you used to do.  Going back to work is possible.   You, your doctor and employer will need to work together to figure how to make it okay for you.          Track and Manage My Blood Pressure-Hypertension   On track    Timeframe:  Long-Range Goal Priority:  Medium Start Date: 02/24/2020                            Expected End Date:   12/12/2020                  Follow Up Date 10/18/2020   -  Continue to watch for signs of a heart attack: chest pain or discomfort, feeling weak,  lightheaded or faint, pain or discomfort in the jaw, neck or back, Pain or discomfort in one or both arms or shoulders, Shortness of breath. - Call 911 for severe symptoms -  Continue to follow up with your cardiologist as recommended and report any ongoing or new symptoms to your doctor as soon as possible.  -  Continue to take your medications as prescribed and refill timely  -  Plan to monitor your blood pressure at least 1 time per week.   Take these reading to your provider appointment.    -  Continue to adhere to a heart healthy diet consisting of fruits, vegetables and whole grains - and low in saturated fat, cholesterol, sodium (salt) and added sugar. - Discuss adding exercise to your daily routine with your doctor    Why is this important?   You won't feel high blood pressure, but it can still hurt your blood vessels.  High blood pressure can cause heart or kidney problems. It can also cause a stroke.  Making lifestyle changes like losing a little weight or eating less salt will help.  Checking your blood pressure at home and at different times of the day can help to control blood pressure.  If the doctor prescribes medicine remember to take it the way the doctor ordered.  Call the office if you cannot afford the medicine or if there are questions about it.             Patient verbalizes understanding of instructions provided today and agrees to view in [place].   The patient has been provided with contact information for the care management team and has been  advised to call with any health related questions or concerns.  The care management team will reach out to the patient again over the next 45 days.   Quinn Plowman RN,BSN,CCM RN Case Manager [place]  [phone]

## 2020-09-22 LAB — SARS CORONAVIRUS 2 (TAT 6-24 HRS): SARS Coronavirus 2: NEGATIVE

## 2020-09-24 ENCOUNTER — Ambulatory Visit (HOSPITAL_COMMUNITY)
Admission: RE | Admit: 2020-09-24 | Discharge: 2020-09-25 | Disposition: A | Discharge status: Home / Self Care | Payer: Medicare HMO | Attending: Surgery | Admitting: Surgery

## 2020-09-24 ENCOUNTER — Ambulatory Visit (HOSPITAL_COMMUNITY): Payer: Medicare HMO | Admitting: Certified Registered Nurse Anesthetist

## 2020-09-24 ENCOUNTER — Encounter (HOSPITAL_COMMUNITY): Payer: Self-pay | Admitting: Surgery

## 2020-09-24 ENCOUNTER — Other Ambulatory Visit: Payer: Self-pay

## 2020-09-24 ENCOUNTER — Ambulatory Visit (HOSPITAL_COMMUNITY): Payer: Medicare HMO | Admitting: Physician Assistant

## 2020-09-24 ENCOUNTER — Encounter (HOSPITAL_COMMUNITY)
Admission: RE | Disposition: A | Discharge status: Home / Self Care | Payer: Self-pay | Source: Home / Self Care | Attending: Surgery

## 2020-09-24 DIAGNOSIS — Z8249 Family history of ischemic heart disease and other diseases of the circulatory system: Secondary | ICD-10-CM | POA: Insufficient documentation

## 2020-09-24 DIAGNOSIS — G4700 Insomnia, unspecified: Secondary | ICD-10-CM | POA: Diagnosis not present

## 2020-09-24 DIAGNOSIS — Z7902 Long term (current) use of antithrombotics/antiplatelets: Secondary | ICD-10-CM | POA: Insufficient documentation

## 2020-09-24 DIAGNOSIS — Z800 Family history of malignant neoplasm of digestive organs: Secondary | ICD-10-CM | POA: Insufficient documentation

## 2020-09-24 DIAGNOSIS — Z8719 Personal history of other diseases of the digestive system: Secondary | ICD-10-CM | POA: Diagnosis present

## 2020-09-24 DIAGNOSIS — Z9889 Other specified postprocedural states: Secondary | ICD-10-CM

## 2020-09-24 DIAGNOSIS — G35 Multiple sclerosis: Secondary | ICD-10-CM | POA: Insufficient documentation

## 2020-09-24 DIAGNOSIS — K430 Incisional hernia with obstruction, without gangrene: Secondary | ICD-10-CM | POA: Diagnosis not present

## 2020-09-24 DIAGNOSIS — I10 Essential (primary) hypertension: Secondary | ICD-10-CM | POA: Insufficient documentation

## 2020-09-24 DIAGNOSIS — Z87891 Personal history of nicotine dependence: Secondary | ICD-10-CM | POA: Diagnosis not present

## 2020-09-24 DIAGNOSIS — I2510 Atherosclerotic heart disease of native coronary artery without angina pectoris: Secondary | ICD-10-CM | POA: Insufficient documentation

## 2020-09-24 DIAGNOSIS — E785 Hyperlipidemia, unspecified: Secondary | ICD-10-CM | POA: Diagnosis not present

## 2020-09-24 DIAGNOSIS — Z79899 Other long term (current) drug therapy: Secondary | ICD-10-CM | POA: Diagnosis not present

## 2020-09-24 DIAGNOSIS — G4733 Obstructive sleep apnea (adult) (pediatric): Secondary | ICD-10-CM | POA: Diagnosis not present

## 2020-09-24 HISTORY — PX: INCISIONAL HERNIA REPAIR: SHX193

## 2020-09-24 LAB — GLUCOSE, CAPILLARY: Glucose-Capillary: 153 mg/dL — ABNORMAL HIGH (ref 70–99)

## 2020-09-24 SURGERY — REPAIR, HERNIA, INCISIONAL, LAPAROSCOPIC
Anesthesia: General | Site: Abdomen

## 2020-09-24 MED ORDER — METOPROLOL TARTRATE 5 MG/5ML IV SOLN
5.0000 mg | Freq: Four times a day (QID) | INTRAVENOUS | Status: DC | PRN
Start: 2020-09-24 — End: 2020-09-25

## 2020-09-24 MED ORDER — CEFAZOLIN SODIUM-DEXTROSE 2-4 GM/100ML-% IV SOLN
2.0000 g | INTRAVENOUS | Status: AC
  Administered 2020-09-24: 2 g via INTRAVENOUS
  Filled 2020-09-24: qty 100

## 2020-09-24 MED ORDER — PROPOFOL 10 MG/ML IV BOLUS
INTRAVENOUS | Status: DC | PRN
  Administered 2020-09-24: 170 mg via INTRAVENOUS
  Administered 2020-09-24: 100 mg via INTRAVENOUS

## 2020-09-24 MED ORDER — 0.9 % SODIUM CHLORIDE (POUR BTL) OPTIME
TOPICAL | Status: DC | PRN
  Administered 2020-09-24: 1000 mL

## 2020-09-24 MED ORDER — FENTANYL CITRATE (PF) 100 MCG/2ML IJ SOLN
INTRAMUSCULAR | Status: AC
  Filled 2020-09-24: qty 2

## 2020-09-24 MED ORDER — OXYCODONE HCL 5 MG PO TABS
5.0000 mg | ORAL_TABLET | Freq: Four times a day (QID) | ORAL | Status: DC | PRN
  Filled 2020-09-24: qty 1

## 2020-09-24 MED ORDER — METHOCARBAMOL 1000 MG/10ML IJ SOLN
500.0000 mg | Freq: Four times a day (QID) | INTRAVENOUS | Status: DC | PRN
  Filled 2020-09-24: qty 5

## 2020-09-24 MED ORDER — VENLAFAXINE HCL ER 37.5 MG PO CP24
37.5000 mg | ORAL_CAPSULE | Freq: Every day | ORAL | Status: DC
  Administered 2020-09-25: 37.5 mg via ORAL
  Filled 2020-09-24: qty 1

## 2020-09-24 MED ORDER — FENTANYL CITRATE PF 50 MCG/ML IJ SOSY: 25.0000 ug | PREFILLED_SYRINGE | INTRAMUSCULAR | Status: DC | PRN

## 2020-09-24 MED ORDER — EPHEDRINE SULFATE-NACL 50-0.9 MG/10ML-% IV SOSY
PREFILLED_SYRINGE | INTRAVENOUS | Status: DC | PRN
  Administered 2020-09-24: 5 mg via INTRAVENOUS

## 2020-09-24 MED ORDER — ACETAMINOPHEN 500 MG PO TABS
1000.0000 mg | ORAL_TABLET | ORAL | Status: AC
  Administered 2020-09-24: 1000 mg via ORAL
  Filled 2020-09-24: qty 2

## 2020-09-24 MED ORDER — ONDANSETRON 4 MG PO TBDP: 4.0000 mg | ORAL_TABLET | Freq: Four times a day (QID) | ORAL | Status: DC | PRN

## 2020-09-24 MED ORDER — FENTANYL CITRATE (PF) 100 MCG/2ML IJ SOLN
INTRAMUSCULAR | Status: DC | PRN
  Administered 2020-09-24 (×2): 50 ug via INTRAVENOUS
  Administered 2020-09-24: 100 ug via INTRAVENOUS
  Administered 2020-09-24 (×2): 50 ug via INTRAVENOUS

## 2020-09-24 MED ORDER — KETOROLAC TROMETHAMINE 15 MG/ML IJ SOLN: 15.0000 mg | Freq: Four times a day (QID) | INTRAMUSCULAR | Status: DC | PRN

## 2020-09-24 MED ORDER — DIPHENHYDRAMINE HCL 12.5 MG/5ML PO ELIX: 12.5000 mg | ORAL_SOLUTION | Freq: Four times a day (QID) | ORAL | Status: DC | PRN

## 2020-09-24 MED ORDER — LIDOCAINE 2% (20 MG/ML) 5 ML SYRINGE
INTRAMUSCULAR | Status: DC | PRN
Start: 2020-09-24 — End: 2020-09-24
  Administered 2020-09-24: 80 mg via INTRAVENOUS

## 2020-09-24 MED ORDER — BUPIVACAINE LIPOSOME 1.3 % IJ SUSP: 20.0000 mL | Freq: Once | INTRAMUSCULAR | Status: DC

## 2020-09-24 MED ORDER — METHOCARBAMOL 500 MG PO TABS: 500.0000 mg | ORAL_TABLET | Freq: Four times a day (QID) | ORAL | Status: DC | PRN

## 2020-09-24 MED ORDER — DEXAMETHASONE SODIUM PHOSPHATE 4 MG/ML IJ SOLN
INTRAMUSCULAR | Status: DC | PRN
  Administered 2020-09-24: 4 mg via INTRAVENOUS

## 2020-09-24 MED ORDER — CARVEDILOL 12.5 MG PO TABS
12.5000 mg | ORAL_TABLET | Freq: Two times a day (BID) | ORAL | Status: DC
  Administered 2020-09-24 – 2020-09-25 (×2): 12.5 mg via ORAL
  Filled 2020-09-24 (×2): qty 1

## 2020-09-24 MED ORDER — OXYCODONE HCL 5 MG PO TABS: 5.0000 mg | ORAL_TABLET | Freq: Once | ORAL | Status: DC | PRN

## 2020-09-24 MED ORDER — CHLORHEXIDINE GLUCONATE 0.12 % MT SOLN
15.0000 mL | Freq: Once | OROMUCOSAL | Status: AC
  Administered 2020-09-24: 15 mL via OROMUCOSAL

## 2020-09-24 MED ORDER — PHENYLEPHRINE 40 MCG/ML (10ML) SYRINGE FOR IV PUSH (FOR BLOOD PRESSURE SUPPORT)
PREFILLED_SYRINGE | INTRAVENOUS | Status: DC | PRN
  Administered 2020-09-24 (×2): 40 ug via INTRAVENOUS
  Administered 2020-09-24 (×3): 80 ug via INTRAVENOUS
  Administered 2020-09-24: 40 ug via INTRAVENOUS

## 2020-09-24 MED ORDER — ENOXAPARIN SODIUM 40 MG/0.4ML IJ SOSY
40.0000 mg | PREFILLED_SYRINGE | INTRAMUSCULAR | Status: DC
  Administered 2020-09-25: 40 mg via SUBCUTANEOUS
  Filled 2020-09-24: qty 0.4

## 2020-09-24 MED ORDER — LACTATED RINGERS IV SOLN: INTRAVENOUS | Status: DC

## 2020-09-24 MED ORDER — ONDANSETRON HCL 4 MG/2ML IJ SOLN: 4.0000 mg | Freq: Four times a day (QID) | INTRAMUSCULAR | Status: DC | PRN

## 2020-09-24 MED ORDER — HYDROMORPHONE HCL 1 MG/ML IJ SOLN: 0.5000 mg | INTRAMUSCULAR | Status: DC | PRN

## 2020-09-24 MED ORDER — BUPIVACAINE LIPOSOME 1.3 % IJ SUSP
INTRAMUSCULAR | Status: AC
  Filled 2020-09-24: qty 20

## 2020-09-24 MED ORDER — SODIUM CHLORIDE 0.9 % IV SOLN: INTRAVENOUS | Status: DC

## 2020-09-24 MED ORDER — SUGAMMADEX SODIUM 200 MG/2ML IV SOLN
INTRAVENOUS | Status: DC | PRN
Start: 2020-09-24 — End: 2020-09-24
  Administered 2020-09-24: 200 mg via INTRAVENOUS

## 2020-09-24 MED ORDER — GABAPENTIN 300 MG PO CAPS
300.0000 mg | ORAL_CAPSULE | ORAL | Status: AC
  Administered 2020-09-24: 300 mg via ORAL
  Filled 2020-09-24: qty 1

## 2020-09-24 MED ORDER — BUPIVACAINE-EPINEPHRINE (PF) 0.25% -1:200000 IJ SOLN
INTRAMUSCULAR | Status: AC
  Filled 2020-09-24: qty 30

## 2020-09-24 MED ORDER — ACETAMINOPHEN 500 MG PO TABS
1000.0000 mg | ORAL_TABLET | Freq: Four times a day (QID) | ORAL | Status: DC
  Administered 2020-09-24 – 2020-09-25 (×4): 1000 mg via ORAL
  Filled 2020-09-24 (×4): qty 2

## 2020-09-24 MED ORDER — HYDRALAZINE HCL 20 MG/ML IJ SOLN: 10.0000 mg | INTRAMUSCULAR | Status: DC | PRN

## 2020-09-24 MED ORDER — MIDAZOLAM HCL 5 MG/5ML IJ SOLN
INTRAMUSCULAR | Status: DC | PRN
  Administered 2020-09-24: 2 mg via INTRAVENOUS

## 2020-09-24 MED ORDER — VENLAFAXINE HCL ER 75 MG PO CP24
75.0000 mg | ORAL_CAPSULE | Freq: Every day | ORAL | Status: DC
  Administered 2020-09-25: 75 mg via ORAL
  Filled 2020-09-24: qty 1

## 2020-09-24 MED ORDER — CHLORHEXIDINE GLUCONATE 4 % EX LIQD: 60.0000 mL | Freq: Once | CUTANEOUS | Status: DC

## 2020-09-24 MED ORDER — PANTOPRAZOLE SODIUM 40 MG IV SOLR
40.0000 mg | Freq: Every day | INTRAVENOUS | Status: DC
  Administered 2020-09-24: 40 mg via INTRAVENOUS
  Filled 2020-09-24: qty 40

## 2020-09-24 MED ORDER — DIPHENHYDRAMINE HCL 50 MG/ML IJ SOLN: 12.5000 mg | Freq: Four times a day (QID) | INTRAMUSCULAR | Status: DC | PRN

## 2020-09-24 MED ORDER — TRAMADOL HCL 50 MG PO TABS: 50.0000 mg | ORAL_TABLET | Freq: Four times a day (QID) | ORAL | Status: DC | PRN

## 2020-09-24 MED ORDER — PROMETHAZINE HCL 25 MG/ML IJ SOLN: 6.2500 mg | INTRAMUSCULAR | Status: DC | PRN

## 2020-09-24 MED ORDER — GABAPENTIN 300 MG PO CAPS
300.0000 mg | ORAL_CAPSULE | Freq: Two times a day (BID) | ORAL | Status: DC
  Administered 2020-09-24 – 2020-09-25 (×2): 300 mg via ORAL
  Filled 2020-09-24 (×2): qty 1

## 2020-09-24 MED ORDER — MIDAZOLAM HCL 2 MG/2ML IJ SOLN
INTRAMUSCULAR | Status: AC
  Filled 2020-09-24: qty 2

## 2020-09-24 MED ORDER — PROPOFOL 10 MG/ML IV BOLUS
INTRAVENOUS | Status: AC
  Filled 2020-09-24: qty 20

## 2020-09-24 MED ORDER — DOCUSATE SODIUM 100 MG PO CAPS
100.0000 mg | ORAL_CAPSULE | Freq: Two times a day (BID) | ORAL | Status: DC
  Administered 2020-09-24 – 2020-09-25 (×2): 100 mg via ORAL
  Filled 2020-09-24 (×2): qty 1

## 2020-09-24 MED ORDER — ROCURONIUM BROMIDE 10 MG/ML (PF) SYRINGE
PREFILLED_SYRINGE | INTRAVENOUS | Status: DC | PRN
  Administered 2020-09-24: 60 mg via INTRAVENOUS
  Administered 2020-09-24: 20 mg via INTRAVENOUS

## 2020-09-24 MED ORDER — OXYCODONE HCL 5 MG/5ML PO SOLN
5.0000 mg | Freq: Once | ORAL | Status: DC | PRN
Start: 2020-09-24 — End: 2020-09-24

## 2020-09-24 MED ORDER — ORAL CARE MOUTH RINSE: 15.0000 mL | Freq: Once | OROMUCOSAL | Status: AC

## 2020-09-24 SURGICAL SUPPLY — 41 items
ADH SKN CLS APL DERMABOND .7 (GAUZE/BANDAGES/DRESSINGS) ×1
APL PRP STRL LF DISP 70% ISPRP (MISCELLANEOUS) ×1
APL SKNCLS STERI-STRIP NONHPOA (GAUZE/BANDAGES/DRESSINGS) ×1
BAG COUNTER SPONGE SURGICOUNT (BAG) IMPLANT
BAG SPNG CNTER NS LX DISP (BAG)
BENZOIN TINCTURE PRP APPL 2/3 (GAUZE/BANDAGES/DRESSINGS) ×1 IMPLANT
BINDER ABDOMINAL 12 ML 46-62 (SOFTGOODS) ×1 IMPLANT
CABLE HIGH FREQUENCY MONO STRZ (ELECTRODE) ×2 IMPLANT
CHLORAPREP W/TINT 26 (MISCELLANEOUS) ×2 IMPLANT
CLSR STERI-STRIP ANTIMIC 1/2X4 (GAUZE/BANDAGES/DRESSINGS) ×1 IMPLANT
COVER SURGICAL LIGHT HANDLE (MISCELLANEOUS) ×2 IMPLANT
DECANTER SPIKE VIAL GLASS SM (MISCELLANEOUS) IMPLANT
DERMABOND ADVANCED (GAUZE/BANDAGES/DRESSINGS) ×1
DERMABOND ADVANCED .7 DNX12 (GAUZE/BANDAGES/DRESSINGS) ×1 IMPLANT
DEVICE SECURE STRAP 25 ABSORB (INSTRUMENTS) ×2 IMPLANT
ELECT REM PT RETURN 15FT ADLT (MISCELLANEOUS) ×2 IMPLANT
GAUZE SPONGE 4X4 12PLY STRL (GAUZE/BANDAGES/DRESSINGS) ×1 IMPLANT
GLOVE SURG ENC MOIS LTX SZ6 (GLOVE) ×2 IMPLANT
GLOVE SURG MICRO LTX SZ6 (GLOVE) ×2 IMPLANT
GLOVE SURG UNDER LTX SZ6.5 (GLOVE) ×2 IMPLANT
GOWN STRL REUS W/TWL LRG LVL3 (GOWN DISPOSABLE) ×2 IMPLANT
GOWN STRL REUS W/TWL XL LVL3 (GOWN DISPOSABLE) ×4 IMPLANT
GRASPER SUT TROCAR 14GX15 (MISCELLANEOUS) ×2 IMPLANT
KIT BASIN OR (CUSTOM PROCEDURE TRAY) ×2 IMPLANT
KIT TURNOVER KIT A (KITS) ×2 IMPLANT
MARKER SKIN DUAL TIP RULER LAB (MISCELLANEOUS) ×2 IMPLANT
MESH VENTRALIGHT ST 4X6IN (Mesh General) ×1 IMPLANT
PENCIL SMOKE EVACUATOR (MISCELLANEOUS) IMPLANT
SCISSORS LAP 5X35 DISP (ENDOMECHANICALS) ×2 IMPLANT
SET IRRIG TUBING LAPAROSCOPIC (IRRIGATION / IRRIGATOR) IMPLANT
SET TUBE SMOKE EVAC HIGH FLOW (TUBING) ×2 IMPLANT
SHEARS HARMONIC ACE PLUS 36CM (ENDOMECHANICALS) IMPLANT
SLEEVE XCEL OPT CAN 5 100 (ENDOMECHANICALS) ×2 IMPLANT
STRIP CLOSURE SKIN 1/2X4 (GAUZE/BANDAGES/DRESSINGS) ×2 IMPLANT
SUT ETHIBOND 0 (SUTURE) ×4 IMPLANT
SUT MNCRL AB 4-0 PS2 18 (SUTURE) ×2 IMPLANT
TAPE CLOTH SURG 6X10 WHT LF (GAUZE/BANDAGES/DRESSINGS) ×1 IMPLANT
TOWEL OR 17X26 10 PK STRL BLUE (TOWEL DISPOSABLE) ×2 IMPLANT
TRAY LAPAROSCOPIC (CUSTOM PROCEDURE TRAY) ×2 IMPLANT
TROCAR BLADELESS OPT 5 100 (ENDOMECHANICALS) ×2 IMPLANT
TROCAR XCEL 12X100 BLDLESS (ENDOMECHANICALS) ×2 IMPLANT

## 2020-09-24 NOTE — Anesthesia Procedure Notes (Signed)
Procedure Name: Intubation Date/Time: 09/24/2020 12:52 PM Performed by: Claudia Desanctis, CRNA Pre-anesthesia Checklist: Patient identified, Emergency Drugs available, Suction available and Patient being monitored Patient Re-evaluated:Patient Re-evaluated prior to induction Oxygen Delivery Method: Circle system utilized Preoxygenation: Pre-oxygenation with 100% oxygen Induction Type: IV induction Ventilation: Mask ventilation without difficulty Laryngoscope Size: 2 and Miller Grade View: Grade II Tube type: Oral Tube size: 7.5 mm Number of attempts: 1 Airway Equipment and Method: Stylet Placement Confirmation: ETT inserted through vocal cords under direct vision, positive ETCO2 and breath sounds checked- equal and bilateral Secured at: 23 cm Tube secured with: Tape Dental Injury: Teeth and Oropharynx as per pre-operative assessment

## 2020-09-24 NOTE — Anesthesia Postprocedure Evaluation (Signed)
Anesthesia Post Note  Patient: Roy [name]  Procedure(s) Performed: LAPAROSCOPIC INCISIONAL HERNIA REPAIR WITH MESH (Abdomen)     Patient location during evaluation: PACU Anesthesia Type: General Level of consciousness: awake and alert Pain management: pain level controlled Vital Signs Assessment: post-procedure vital signs reviewed and stable Respiratory status: spontaneous breathing, nonlabored ventilation, respiratory function stable and patient connected to nasal cannula oxygen Cardiovascular status: blood pressure returned to baseline and stable Postop Assessment: no apparent nausea or vomiting Anesthetic complications: no   No notable events documented.  Last Vitals:  Vitals:   09/24/20 1530 09/24/20 1545  BP: (!) 158/106 (!) 162/103  Pulse: 84 79  Resp: 11 12  Temp:    SpO2: 99% 96%    Last Pain:  Vitals:   09/24/20 1545  TempSrc:   PainSc: 0-No pain                 Belenda Cruise P Mckenna Boruff

## 2020-09-24 NOTE — Transfer of Care (Signed)
Immediate Anesthesia Transfer of Care Note  Patient: Roy [name]  Procedure(s) Performed: LAPAROSCOPIC INCISIONAL HERNIA REPAIR WITH MESH (Abdomen)  Patient Location: PACU  Anesthesia Type:General  Level of Consciousness: awake  Airway & Oxygen Therapy: Patient Spontanous Breathing and Patient connected to face mask oxygen  Post-op Assessment: Report given to RN, Post -op Vital signs reviewed and stable and Patient moving all extremities X 4  Post vital signs: Reviewed and stable  Last Vitals:  Vitals Value Taken Time  BP 151/99   Temp    Pulse 86   Resp    SpO2 99     Last Pain:  Vitals:   09/24/20 1043  TempSrc: Oral  PainSc: 0-No pain         Complications: No notable events documented.

## 2020-09-24 NOTE — Interval H&P Note (Signed)
History and Physical Interval Note:  09/24/2020 10:54 AM  Roy [name]  has presented today for surgery, with the diagnosis of INCISIONAL HERNIA.  The various methods of treatment have been discussed with the patient and family. After consideration of risks, benefits and other options for treatment, the patient has consented to  Procedure(s): [name] (N/A) as a surgical intervention.  The patient's history has been reviewed, patient examined, no change in status, stable for surgery.  I have reviewed the patient's chart and labs.  Questions were answered to the patient's satisfaction.     Swade Shonka Rich Brave

## 2020-09-24 NOTE — Op Note (Signed)
Operative Note  Roy [name]  [id number]  ED:[id number]  09/24/2020   Surgeon: Romana Juniper MD FACS   Assistant: Drucie Ip MD (PGY3) I was personally present and scrubbed during the entirety of this procedure as documented in my operative note.    Procedure performed: Laparoscopic lysis of adhesions x30 minutes, laparoscopic assisted incisional hernia repair with mesh underlay   Preop diagnosis: Incarcerated incisional hernia Post-op diagnosis/intraop findings: Same   Specimens: No Retained items: no  EBL: [id number] Complications: none   Description of procedure: After obtaining informed consent the patient was taken to the operating room and placed supine on operating room table where general endotracheal anesthesia was initiated, preoperative antibiotics were administered, SCDs applied, and a formal timeout was performed.  Foley catheter was inserted which is removed at the end of the case.  The abdomen was then clipped, prepped and draped in usual sterile fashion.  Peritoneal access was gained with an optical entry in the left upper quadrant and insufflation to 15 mmHg ensued without incident.  2 additional 5 mm trochars were placed in the right hemiabdomen under direct visualization.  The bowel which had been incarcerated in the hernia had reduced with insufflation.  Omental adhesions to the anterior abdominal wall surrounding the hernia in the left lower quadrant were carefully taken down with blunt dissection and harmonic scalpel, clearing off the hernia defect and the abdominal wall sufficiently to have a landing zone for the mesh.  We then turned to the left lower quadrant where the prior hypertrophic scar was excised sharply and then cautery and blunt dissection were used to dissect through the soft tissues, circumferentially skeletonizing the hernia sac down to the level of the fascia where it was then excised and discarded.  The fascia was cleared off circumferentially elevating skin  flaps slightly.  The fascial defect measured 5 cm cephalocaudad by 3 cm wide.  A 10 x 15 cm coated ventralight mesh was selected and marked for orientation.  #1 Novafil sutures were placed in the 4 apical directions along the mesh and then this was rolled up and inserted into the abdomen through the hernia defect.  The fascia was closed vertically with simple interrupted #1 Novafil's.  The abdomen was then reinsufflated and inspected.  The closure was airtight with no entrapped structures.  The mesh was unfurled and oriented with the coated side facing the viscera and the rough side facing the abdominal wall.  Using the laparoscopic suture passer the previously placed stay sutures on the mesh were brought through the abdominal wall in a transvaginal fashion securing the mesh to the abdominal wall.  These were tied down and the mesh was noted to be flush against the abdominal wall.  The secure strap tacker was then used to circumferentially affix the mesh to the abdominal wall with an outer crown and an inner crown of tacks.  On completion the mesh is flush and flat with no exposed rough surface.  The omentum was brought back down to cover the viscera as much as possible.  Exparel mixed with quarter percent Marcaine was then infiltrated throughout the abdominal wall surrounding the hernia and in a taps fashion on the left side. The abdomen was then once again inspected and hemostasis confirmed.  Peritoneal cavity was desufflated and the trochars removed.  The soft tissue at the hernia site was reapproximated with interrupted 3-0 Vicryl's.  Deep dermal interrupted 3-0 Vicryl's followed by running subcuticular 4-0 Monocryl were then used to close the skin.  The port sites were then closed with subcuticular 4-0 Monocryl.  Benzoin, Steri-Strips and Band-Aids were applied as well as a gauze and tape dressing at the larger incision.  An abdominal binder is placed.  The patient was then awakened, extubated and taken to PACU  in stable condition.    All counts were correct at the completion of the case.

## 2020-09-25 ENCOUNTER — Encounter (HOSPITAL_COMMUNITY): Payer: Self-pay | Admitting: Surgery

## 2020-09-25 DIAGNOSIS — I2510 Atherosclerotic heart disease of native coronary artery without angina pectoris: Secondary | ICD-10-CM | POA: Diagnosis not present

## 2020-09-25 DIAGNOSIS — K430 Incisional hernia with obstruction, without gangrene: Secondary | ICD-10-CM | POA: Diagnosis not present

## 2020-09-25 DIAGNOSIS — Z87891 Personal history of nicotine dependence: Secondary | ICD-10-CM | POA: Diagnosis not present

## 2020-09-25 DIAGNOSIS — Z800 Family history of malignant neoplasm of digestive organs: Secondary | ICD-10-CM | POA: Diagnosis not present

## 2020-09-25 DIAGNOSIS — Z8719 Personal history of other diseases of the digestive system: Secondary | ICD-10-CM | POA: Diagnosis not present

## 2020-09-25 DIAGNOSIS — Z7902 Long term (current) use of antithrombotics/antiplatelets: Secondary | ICD-10-CM | POA: Diagnosis not present

## 2020-09-25 DIAGNOSIS — Z8249 Family history of ischemic heart disease and other diseases of the circulatory system: Secondary | ICD-10-CM | POA: Diagnosis not present

## 2020-09-25 DIAGNOSIS — G35 Multiple sclerosis: Secondary | ICD-10-CM | POA: Diagnosis not present

## 2020-09-25 DIAGNOSIS — Z9889 Other specified postprocedural states: Secondary | ICD-10-CM | POA: Diagnosis not present

## 2020-09-25 DIAGNOSIS — Z79899 Other long term (current) drug therapy: Secondary | ICD-10-CM | POA: Diagnosis not present

## 2020-09-25 LAB — CBC
HCT: 41 % (ref 39.0–52.0)
Hemoglobin: 14 g/dL (ref 13.0–17.0)
MCH: 31.3 pg (ref 26.0–34.0)
MCHC: 34.1 g/dL (ref 30.0–36.0)
MCV: 91.5 fL (ref 80.0–100.0)
Platelets: 146 10*3/uL — ABNORMAL LOW (ref 150–400)
RBC: 4.48 MIL/uL (ref 4.22–5.81)
RDW: 12.6 % (ref 11.5–15.5)
WBC: 10.9 10*3/uL — ABNORMAL HIGH (ref 4.0–10.5)
nRBC: 0 % (ref 0.0–0.2)

## 2020-09-25 LAB — MAGNESIUM: Magnesium: 1.7 mg/dL (ref 1.7–2.4)

## 2020-09-25 LAB — BASIC METABOLIC PANEL
Anion gap: 7 (ref 5–15)
BUN: 14 mg/dL (ref 8–23)
CO2: 26 mmol/L (ref 22–32)
Calcium: 8.7 mg/dL — ABNORMAL LOW (ref 8.9–10.3)
Chloride: 103 mmol/L (ref 98–111)
Creatinine, Ser: 1.01 mg/dL (ref 0.61–1.24)
GFR, Estimated: >60 mL/min (ref >60)
Glucose, Bld: 150 mg/dL — ABNORMAL HIGH (ref 70–99)
Potassium: 4.2 mmol/L (ref 3.5–5.1)
Sodium: 136 mmol/L (ref 135–145)

## 2020-09-25 MED ORDER — MAGNESIUM SULFATE 50 % IJ SOLN: 3.0000 g | Freq: Once | INTRAVENOUS | Status: DC

## 2020-09-25 MED ORDER — DOCUSATE SODIUM 100 MG PO CAPS: 100.0000 mg | ORAL_CAPSULE | Freq: Two times a day (BID) | ORAL | 0 refills | Status: DC

## 2020-09-25 MED ORDER — GABAPENTIN 300 MG PO CAPS: 300.0000 mg | ORAL_CAPSULE | Freq: Two times a day (BID) | ORAL | 0 refills | Status: DC

## 2020-09-25 MED ORDER — OXYCODONE HCL 5 MG PO TABS: 5.0000 mg | ORAL_TABLET | Freq: Four times a day (QID) | ORAL | 0 refills | Status: DC | PRN

## 2020-09-25 MED ORDER — MAGNESIUM SULFATE IN D5W 1-5 GM/100ML-% IV SOLN
1.0000 g | Freq: Once | INTRAVENOUS | Status: AC
  Administered 2020-09-25: 1 g via INTRAVENOUS
  Filled 2020-09-25: qty 100

## 2020-09-25 MED ORDER — MAGNESIUM SULFATE 2 GM/50ML IV SOLN
2.0000 g | Freq: Once | INTRAVENOUS | Status: AC
  Administered 2020-09-25: 2 g via INTRAVENOUS
  Filled 2020-09-25: qty 50

## 2020-09-25 NOTE — Discharge Instructions (Addendum)
HERNIA REPAIR: POST OP INSTRUCTIONS   EAT Gradually transition to a high fiber diet with a fiber supplement over the next few weeks after discharge.  Start with a pureed / full liquid diet (see below)  WALK Walk an hour a day (cumulative- not all at once).  Control your pain to do that.  Wear your abdominal binder at all times for the first week after surgery, and then wear it whenever you are up and moving around.  CONTROL PAIN Control pain so that you can walk, sleep, tolerate sneezing/coughing, and go up/down stairs.  HAVE A BOWEL MOVEMENT DAILY Keep your bowels regular to avoid problems.  OK to try a laxative to override constipation.  OK to use an antidairrheal to slow down diarrhea.  Call if not better after 2 tries  CALL IF YOU HAVE PROBLEMS/CONCERNS Call if you are still struggling despite following these instructions. Call if you have concerns not answered by these instructions  ######################################################################    DIET: Follow a light bland diet & liquids the first 24 hours after arrival home, such as soup, liquids, starches, etc.  Be sure to drink plenty of fluids.  Quickly advance to a usual solid diet within a few days.  Avoid fast food or heavy meals as your are more likely to get nauseated or have irregular bowels.  A low-sugar, high-fiber diet for the rest of your life is ideal.   Take your usually prescribed home medications unless otherwise directed.  PAIN CONTROL: Pain is best controlled by a usual combination of three different methods TOGETHER: Ice/Heat Over the counter pain medication Prescription pain medication Most patients will experience some swelling and bruising around the hernia(s) such as the bellybutton, groins, or old incisions.  Ice packs or heating pads (30-60 minutes up to 6 times a day) will help. Use ice for the first few days to help decrease swelling and bruising, then switch to heat to help relax tight/sore  spots and speed recovery.  Some people prefer to use ice alone, heat alone, alternating between ice & heat.  Experiment to what works for you.  Swelling and bruising can take several weeks to resolve.   It is helpful to take an over-the-counter pain medication regularly for the first days: Naproxen (Aleve, etc)  Two '220mg'$  tabs twice a day OR Ibuprofen (Advil, etc) Three '200mg'$  tabs four times a day (every meal & bedtime) AND Acetaminophen (Tylenol, etc) 325-'650mg'$  four times a day (every meal & bedtime) A  prescription for pain medication should be given to you upon discharge.  Take your pain medication as prescribed, IF NEEDED.  If you are having problems/concerns with the prescription medicine (does not control pain, nausea, vomiting, rash, itching, etc), please call us [phone] to see if we need to switch you to a different pain medicine that will work better for you and/or control your side effect better. If you need a refill on your pain medication, please contact your pharmacy.  They will contact our office to request authorization. Prescriptions will not be filled after 5 pm or on week-ends.  Avoid getting constipated.  Between the surgery and the pain medications, it is common to experience some constipation.  Increasing fluid intake and taking a fiber supplement (such as Metamucil, Citrucel, FiberCon, MiraLax, etc) 1-2 times a day regularly will usually help prevent this problem from occurring.  A mild laxative (prune juice, Milk of Magnesia, MiraLax, etc) should be taken according to package directions if there are no bowel  movements after 48 hours.    Wash / shower every day, starting 2 days after surgery.  You may shower over the steri strips which are waterproof.  Do not submerge or soak until incisions are completely healed.  No rubbing, scrubbing, lotions or ointments to incisions.  Remove your outer bandage 2 days after surgery.  Steri-Strips should stay on for 1 to 2 weeks.  You may  leave the incision open to air.  You may replace a dressing/Band-Aid to cover an incision for comfort if you wish.  Continue to shower over incision(s) after the dressing is off.  ACTIVITIES as tolerated:   You may resume regular (light) daily activities beginning the next day--such as daily self-care, walking, climbing stairs--gradually increasing activities as tolerated.  Control your pain so that you can walk an hour a day.  If you can walk 30 minutes without difficulty, it is safe to try more intense activity such as jogging, treadmill, bicycling, low-impact aerobics, swimming, etc. Refrain from the most intensive and strenuous activity such as sit-ups, heavy lifting, contact sports, etc  Refrain from any heavy lifting or straining until 6 weeks after surgery.   DO NOT PUSH THROUGH PAIN.  Let pain be your guide: If it hurts to do something, don't do it.  Pain is your body warning you to avoid that activity for another week until the pain goes down. You may drive when you are no longer taking prescription pain medication, you can comfortably wear a seatbelt, and you can safely maneuver your car and apply brakes. You may have sexual intercourse when it is comfortable.   FOLLOW UP in our office Please call [facility] at [phone] to set up an appointment to see your surgeon in the office for a follow-up appointment approximately 2-3 weeks after your surgery. Make sure that you call for this appointment the day you arrive home to insure a convenient appointment time.  9.  If you have disability of FMLA / Family leave forms, please bring the forms to the office for processing.  (do not give to your surgeon).  WHEN TO CALL us [phone]: Poor pain control Reactions / problems with new medications (rash/itching, nausea, etc)  Fever over 101.5 F (38.5 C) Inability to urinate Nausea and/or vomiting Worsening swelling or bruising Continued bleeding from incision. Increased pain, redness, or  drainage from the incision   The clinic staff is available to answer your questions during regular business hours (8:30am-5pm).  Please don't hesitate to call and ask to speak to one of our nurses for clinical concerns.   If you have a medical emergency, go to the nearest emergency room or call 911.  A surgeon from [facility] is always on call at the hospitals in [facility], [place], [place], [place], [place]  [address] ?  [address] [address], [place], [place]   [address] MAIN: [phone] ? TOLL FREE: [phone] ? FAX: [phone] [email or web address]

## 2020-09-25 NOTE — Discharge Summary (Signed)
Physician Discharge Summary  Patient ID: Roy [name] MRN: [id number] DOB/AGE: 05/31/1945 75 y.o.  Admit date: 09/24/2020 Discharge date: 09/25/20  Admission Diagnoses: Incarcerated incisional hernia  Discharge Diagnoses:  Active Problems:   S/P hernia surgery   Discharged Condition: good  Hospital Course: He was admitted for standard postoperative care following laparoscopic assisted repair of incarcerated incisional hernia with mesh underlay.  On postop day 1 he was tolerating a diet, passing flatus, and pain was very well controlled with minimal medication.  He was able to mobilize well.  He was deemed stable for discharge.   Discharge Exam: Blood pressure (!) 141/94, pulse 83, temperature 98.2 F (36.8 C), temperature source Oral, resp. rate 18, height '5\' 6"'$  (1.676 m), weight 87.1 kg, SpO2 98 %. See rounding note  Disposition: Discharge disposition: 01-Home or Self Care       Allergies as of 09/25/2020   No Known Allergies      Medication List     TAKE these medications    acetaminophen 325 MG tablet Commonly known as: TYLENOL Take 2 tablets (650 mg total) by mouth every 6 (six) hours as needed.   atorvastatin 80 MG tablet Commonly known as: LIPITOR Take 1 tablet (80 mg total) by mouth daily. What changed: how much to take   carvedilol 12.5 MG tablet Commonly known as: COREG TAKE 1 TABLET BY MOUTH TWICE A DAY   clopidogrel 75 MG tablet Commonly known as: PLAVIX TAKE 1 TABLET BY MOUTH EVERY DAY   docusate sodium 100 MG capsule Commonly known as: COLACE Take 1 capsule (100 mg total) by mouth 2 (two) times daily.   feeding supplement Liqd Take 237 mLs by mouth in the morning.   fluocinonide 0.05 % external solution Commonly known as: LIDEX Apply 1 application topically at bedtime as needed (psoriasis).   gabapentin 300 MG capsule Commonly known as: NEURONTIN Take 1 capsule (300 mg total) by mouth 2 (two) times daily for 5 days.    hydrocortisone cream 1 % Apply 1 application topically 2 (two) times daily as needed for itching.   nitroGLYCERIN 0.4 MG SL tablet Commonly known as: NITROSTAT Place 1 tablet (0.4 mg total) under the tongue every 5 (five) minutes x 3 doses as needed for chest pain.   omeprazole 20 MG capsule Commonly known as: PRILOSEC TAKE 1 CAPSULE BY MOUTH EVERY DAY   oxyCODONE 5 MG immediate release tablet Commonly known as: Oxy IR/ROXICODONE Take 1 tablet (5 mg total) by mouth every 6 (six) hours as needed for moderate pain.   triamcinolone cream 0.1 % Commonly known as: KENALOG Apply 1 application topically 3 (three) times daily as needed (psoriasis).   venlafaxine XR 75 MG 24 hr capsule Commonly known as: EFFEXOR-XR TAKE 1 CAPSULE BY MOUTH DAILY WITH BREAKFAST.   venlafaxine XR 37.5 MG 24 hr capsule Commonly known as: EFFEXOR-XR TAKE ONE TABLET DAILY IN ADDITION TO 5 MG TABLETS        Follow-up Information     Clovis Riley, MD Follow up in 3 week(s).   Specialty: General Surgery Contact information: [address] [place] [address] [phone]                 Signed: Clovis Riley 09/26/2020, 10:50 AM

## 2020-09-25 NOTE — Telephone Encounter (Signed)
Called pt he said that he would call back once he is discharged from the hospital in the next few days

## 2020-09-25 NOTE — Progress Notes (Signed)
S: No complaints, slept well and has minimal pain.  Tolerating p.o. without nausea, abdominal bloating, endorses flatus  O: Vitals, labs, intake/output, and orders reviewed at this time.  T-max 99.3, heart rate 66, mildly hypertensive, sats 100% on room air reported although he has a nasal cannula on the this morning when I see him.  PO2 40, urine output 1140.  BMP unremarkable, magnesium 1.7. WBC 10.9 (7.4), hgb 14 (15.6), plt 146 (183).  Has received no PRNs overnight   Gen: A&Ox3, no distress  H&N: EOMI, atraumatic, neck supple Chest: unlabored respirations, RRR Abd: soft, minimally appropriately tender, minimally distended, incision(s) c/d/i no cellulitis or hematoma.  Surgical dressing on left lower quadrant incision clean and dry. Ext: warm, no edema Neuro: grossly normal  Lines/tubes/drains: piv  A/P: Postop day 1 status post laparoscopic repair of incarcerated incisional hernia, doing well -Mobilize, continue SCDs while in bed and abdominal binder at all times -Pulmonary toilet, wean off oxygen -Replace magnesium IV  Plan discharge this afternoon if tolerating diet and mobilizing well with good pain control  Romana Juniper, MD [facility], [place]

## 2020-09-25 NOTE — Progress Notes (Signed)
Mobility Specialist - Progress Note    09/25/20 1047  Oxygen Therapy  SpO2 98 %  O2 Device Room Air  Mobility  Activity Ambulated in hall  Level of Assistance Modified independent, requires aide device or extra time  Assistive Device None  Distance Ambulated (ft) 650 ft  Mobility Ambulated with assistance in hallway  Mobility Response Tolerated well  Mobility performed by Mobility specialist  $Mobility charge 1 Mobility    Pre-mobility: 98% SpO2 Post-mobility: 99% SPO2  Upon entry pt was agreeable to ambulate and used IV pole for stability while walking ~650 ft in hallway. No reports of SOB, pain, or dizziness were presented. Pt stated feeling "worn out" at the end, but tolerated session well. Pt was returned to bed and left with call bell at side.   [facility] Phone: [phone] 09/25/20, 10:50 AM

## 2020-09-25 NOTE — Progress Notes (Signed)
Discharge instructions given to patient and all questions were answered.  

## 2020-10-12 DIAGNOSIS — I10 Essential (primary) hypertension: Secondary | ICD-10-CM | POA: Diagnosis not present

## 2020-10-12 DIAGNOSIS — I2510 Atherosclerotic heart disease of native coronary artery without angina pectoris: Secondary | ICD-10-CM

## 2020-10-18 ENCOUNTER — Ambulatory Visit (INDEPENDENT_AMBULATORY_CARE_PROVIDER_SITE_OTHER): Payer: Medicare HMO

## 2020-10-18 DIAGNOSIS — Z9181 History of falling: Secondary | ICD-10-CM

## 2020-10-18 DIAGNOSIS — I10 Essential (primary) hypertension: Secondary | ICD-10-CM

## 2020-10-18 DIAGNOSIS — I2510 Atherosclerotic heart disease of native coronary artery without angina pectoris: Secondary | ICD-10-CM

## 2020-10-18 NOTE — Patient Instructions (Signed)
Visit Information:  Thank you for taking the time to speak with me today.   PATIENT GOALS:  Goals Addressed             This Visit's Progress    Patient will report having no falls   Not on track    Timeframe:  Long-Range Goal Priority:  High Start Date:  02/24/2020                           Expected End Date: 01/11/2021            Follow Up Date 01/03/2021   - always keep your cell phone with you.  - Keep walkways clear of clutter and avoid throw rugs or use non slip rugs - use a nightlight in the bathroom/ room/ hallway - wear my glasses and/or hearing aid - Continue to follow up with your providers as recommended.  - Contact the surgeon office for any increase / worsening symptoms to hernia surgical site.  - Wear secure fitting shoes at all times with ambulation - Always be mindful of your surroundings when walking. Try to walk on even surfaces.  - Check your blood pressure at least 1-2 times per month  Why is this important?   Most falls happen when it is hard for you to walk safely. Your balance may be off because of an illness. You may have pain in your knees, hip or other joints.  You may be overly tired or taking medicines that make you sleepy. You may not be able to see or hear clearly.  Falls can lead to broken bones, bruises or other injuries.  There are things you can do to help prevent falling.          Patient will report returning to his parttime job in 6 months   On track    Timeframe:  Long-Range Goal Priority:  Medium Start Date:  02/24/2019                           Expected End Date: 01/11/2021                  Follow Up Date: 01/03/2021  - Call your provider office for new concerns or questions - talk with your employer about a plan to go back to work when ready - Follow up with your providers as recommended and discuss return to work strategy    Why is this important?   You might be worried about going back to work after a heart attack.  You may  wonder if you will have another heart attack if you do.   You might also think that you can't do what you used to do.  Going back to work is possible.   You, your doctor and employer will need to work together to figure how to make it okay for you.             Patient verbalizes understanding of instructions provided today and agrees to view in [place].   The patient has been provided with contact information for the care management team and has been advised to call with any health related questions or concerns.  The care management team will reach out to the patient again over the next 1-2 months    Quinn Plowman RN,BSN,CCM RN Case Manager [facility]  [phone]

## 2020-10-18 NOTE — Chronic Care Management (AMB) (Addendum)
Encounter details: CCM Time Spent       Value Time User   Time spent with patient (minutes)  42 10/18/2020 11:49 AM Dannielle Karvonen, RN   Time spent performing Chart review  13 10/18/2020 11:49 AM Dannielle Karvonen, RN   Total time (minutes)  55 10/18/2020 11:49 AM Dannielle Karvonen, RN      Moderate to High Complex Decision Making       Value Time User   Moderate to High complex decision making  Yes 10/18/2020 11:49 AM Dannielle Karvonen, RN      CCM Services: This encounter meets complex CCM services and moderate to high decision making.  Prior to outreach and patient consent for Chronic Care Management, I referred this patient for services after reviewing the nominated patient list or from a personal encounter with the patient.  I have personally reviewed this encounter including the documentation in this note and have collaborated with the care management provider regarding care management and care coordination activities to include development and update of the comprehensive care plan. I am certifying that I agree with the content of this note and encounter as supervising physician.       Chronic Care Management   CCM RN Visit Note  10/18/2020 Name: Roy [name] MRN: [id number] DOB: April 22, 1945  Subjective: Roy [name] is a 75 y.o. year old male who is a primary care patient of [name], Roy E, MD. The care management team was consulted for assistance with disease management and care coordination needs.    Engaged with patient by telephone for follow up visit in response to provider referral for case management and/or care coordination services.   Consent to Services:  The patient was given information about Chronic Care Management services, agreed to services, and gave verbal consent prior to initiation of services.  Please see initial visit note for detailed documentation.   Patient agreed to services and verbal consent obtained.   Assessment: Review of patient past  medical history, allergies, medications, health status, including review of consultants reports, laboratory and other test data, was performed as part of comprehensive evaluation and provision of chronic care management services.   SDOH (Social Determinants of Health) assessments and interventions performed:    CCM Care Plan  No Known Allergies  Outpatient Encounter Medications as of 10/18/2020  Medication Sig Note   acetaminophen (TYLENOL) 325 MG tablet Take 2 tablets (650 mg total) by mouth every 6 (six) hours as needed.    atorvastatin (LIPITOR) 80 MG tablet Take 1 tablet (80 mg total) by mouth daily. (Patient taking differently: Take 40 mg by mouth daily.)    carvedilol (COREG) 12.5 MG tablet TAKE 1 TABLET BY MOUTH TWICE A DAY    clopidogrel (PLAVIX) 75 MG tablet TAKE 1 TABLET BY MOUTH EVERY DAY    docusate sodium (COLACE) 100 MG capsule Take 1 capsule (100 mg total) by mouth 2 (two) times daily.    feeding supplement (ENSURE IMMUNE HEALTH) LIQD Take 237 mLs by mouth in the morning.    fluocinonide (LIDEX) 0.05 % external solution Apply 1 application topically at bedtime as needed (psoriasis).    gabapentin (NEURONTIN) 300 MG capsule Take 1 capsule (300 mg total) by mouth 2 (two) times daily for 5 days.    hydrocortisone cream 1 % Apply 1 application topically 2 (two) times daily as needed for itching.    nitroGLYCERIN (NITROSTAT) 0.4 MG SL tablet Place 1 tablet (0.4 mg total) under the tongue  every 5 (five) minutes x 3 doses as needed for chest pain.    omeprazole (PRILOSEC) 20 MG capsule TAKE 1 CAPSULE BY MOUTH EVERY DAY    oxyCODONE (OXY IR/ROXICODONE) 5 MG immediate release tablet Take 1 tablet (5 mg total) by mouth every 6 (six) hours as needed for moderate pain.    triamcinolone (KENALOG) 0.1 % Apply 1 application topically 3 (three) times daily as needed (psoriasis).    venlafaxine XR (EFFEXOR-XR) 37.5 MG 24 hr capsule TAKE ONE TABLET DAILY IN ADDITION TO 75 MG TABLETS     venlafaxine XR (EFFEXOR-XR) 75 MG 24 hr capsule TAKE 1 CAPSULE BY MOUTH DAILY WITH BREAKFAST. 09/14/2020: Take with 37.5 mg capsule for a total of 112.5 mg daily   No facility-administered encounter medications on file as of 10/18/2020.    Patient Active Problem List   Diagnosis Date Noted   S/P hernia surgery 09/24/2020   Spinal stenosis 05/08/2020   Imbalance 05/08/2020   History of colostomy reversal 03/07/2020   Controlled type 2 diabetes mellitus with hyperglycemia ([place]) 07/23/2019   Insomnia 07/23/2019   Personal history of multiple sclerosis ([place]) 12/02/2018   Weakness 03/31/2017   Internal hemorrhoids with complication 09/81/1914   CAD, RCA PCI 2001, urgent Dx2 DES 04/24/14 04/24/2014   Unstable angina with ST elelvation and NSVT while on treadmill 04/24/14    History of basal cell carcinoma 03/07/2014   GERD (gastroesophageal reflux disease) 03/07/2014   Incomplete emptying of bladder 03/07/2014   Obstructive sleep apnea 08/20/2009   Dyslipidemia 11/09/2008   Depression, major, recurrent ([place]) 07/06/2007   Essential hypertension 07/06/2007   Coronary artery disease involving native coronary artery of native heart without angina pectoris 07/06/2007    Conditions to be addressed/monitored:CAD, HTN, and Falls  Care Plan : Fall Risk (Adult)  Updates made by Dannielle Karvonen, RN since 10/18/2020 12:00 AM     Problem: At risk for falls   Priority: High     Long-Range Goal: Absence of Fall and Fall-Related Injury   Start Date: 02/24/2020  Expected End Date: 01/11/2021  Recent Progress: On track  Priority: High  Note:   Current Barriers:  Knowledge Deficits related to fall precautions in patient with symptoms of balance issues: Patient reports sustaining fall since last outreach with provider. States he fell outside of his home walking his dog.  He states he thinks his foot might have gotten caught on tree branch or tripped on rock.  Patient denies serious injury with fall.  States he had only mild bruising.   RNCM discussed with patient fall safety/ prevention.  Patient reports he does not ambulate with cane or walker.  He states he does not feel it is necessary at this time.  Patient reports having hernia surgery on 09/24/2020.  He reports he is still having some swelling in area with mild to moderate pain.  He reports having a follow up with surgeon on 10/11/2020 and plan was to monitor surgical area and return to office on 10/26/2020.    Clinical Goal(s):  Patient will demonstrate ongoing adherence to prescribed treatment plan for decreasing falls Patient will not experience additional falls Patient will verbalize understanding of plan for fall precautions:  Patient will attend all scheduled medical appointments  Interventions:  Collaboration with Jinny Sanders, MD regarding development and update of comprehensive plan of care as evidenced by provider attestation and co-signature Inter-disciplinary care team collaboration (see longitudinal plan of care) Provided verbal education re: Potential causes of falls and  Fall prevention strategies  Assessed for falls since last encounter. Assessed patients knowledge of fall risk prevention  Patient Goals/Self-Care Activities - always keep your cell phone with you.  - Keep walkways clear of clutter and avoid throw rugs or use non slip rugs - use a nightlight in the bathroom/ room/ hallway - wear my glasses and/or hearing aid - Continue to follow up with your providers as recommended.  - Contact the surgeon office for any increase / worsening symptoms to hernia surgical site.  - Wear secure fitting shoes at all times with ambulation - Always be mindful of your surroundings when walking. Try to walk on even surfaces.  - Check your blood pressure at least 1-2 times per month Follow Up Plan: The patient has been provided with contact information for the care management team and has been advised to call with any health related  questions or concerns.  The care management team will reach out to the patient again over the next 1-2 months.     Plan:The patient has been provided with contact information for the care management team and has been advised to call with any health related questions or concerns.  and The care management team will reach out to the patient again over the next 1-2 months . Quinn Plowman RN,BSN,CCM RN Case Manager [place]  [phone]

## 2020-11-08 ENCOUNTER — Ambulatory Visit: Payer: Medicare HMO | Admitting: Family Medicine

## 2020-11-09 ENCOUNTER — Other Ambulatory Visit: Payer: Self-pay

## 2020-11-09 ENCOUNTER — Ambulatory Visit (INDEPENDENT_AMBULATORY_CARE_PROVIDER_SITE_OTHER): Payer: Medicare HMO | Admitting: Family Medicine

## 2020-11-09 ENCOUNTER — Encounter: Payer: Self-pay | Admitting: Family Medicine

## 2020-11-09 VITALS — BP 136/88 | HR 86 | Temp 97.6°F | Ht 66.0 in | Wt 189.0 lb

## 2020-11-09 DIAGNOSIS — E785 Hyperlipidemia, unspecified: Secondary | ICD-10-CM

## 2020-11-09 DIAGNOSIS — R69 Illness, unspecified: Secondary | ICD-10-CM | POA: Diagnosis not present

## 2020-11-09 DIAGNOSIS — R42 Dizziness and giddiness: Secondary | ICD-10-CM | POA: Diagnosis not present

## 2020-11-09 DIAGNOSIS — E7800 Pure hypercholesterolemia, unspecified: Secondary | ICD-10-CM

## 2020-11-09 DIAGNOSIS — F331 Major depressive disorder, recurrent, moderate: Secondary | ICD-10-CM

## 2020-11-09 DIAGNOSIS — E1159 Type 2 diabetes mellitus with other circulatory complications: Secondary | ICD-10-CM | POA: Diagnosis not present

## 2020-11-09 DIAGNOSIS — I10 Essential (primary) hypertension: Secondary | ICD-10-CM

## 2020-11-09 NOTE — Patient Instructions (Addendum)
Start COQ10  for muscle ache from statin medications.   Please stop at the lab to have labs drawn. Continue 40 mg daily of atorvastatin for now.  Referral placed for ENT.  Call if back pain, weakness or balance issues worsening.

## 2020-11-09 NOTE — Assessment & Plan Note (Signed)
Stable, chronic.  Continue current medication.   Good control on  coreg 12.5 mg BID

## 2020-11-09 NOTE — Assessment & Plan Note (Signed)
Due for re-eval. 

## 2020-11-09 NOTE — Progress Notes (Signed)
Patient ID: Roy [name], male    DOB: 1945-11-27, 75 y.o.   MRN: [id number]  This visit was conducted in person.  BP 136/88   Pulse 86   Temp 97.6 F (36.4 C) (Temporal)   Ht 5\' 6"  (1.676 m)   Wt 189 lb (85.7 kg)   SpO2 95%   BMI 30.51 kg/m    CC: Chief Complaint  Patient presents with   Diabetes    Subjective:   HPI: Roy [name] is a 75 y.o. male presenting on 11/09/2020 for Diabetes   S/P incisional hernia surgery 09/2020  Dr. Windle Guard.  Diabetes:   Diet controlled. Lab Results  Component Value Date   HGBA1C 6.3 (H) 09/18/2020  Using medications without difficulties: Hypoglycemic episodes: Hyperglycemic episodes: Feet problems: no ulcers Blood Sugars averaging: not checking eye exam within last year:  Elevated Cholesterol: Due for re- eval given very high trigs last check 523  On atorvastatin 40 mg 1/2 tablet daily given CAD Using medications without problems: decreased form 80 given leg cramps. Muscle aches:  Diet compliance: Exercise: Other complaints:   MDD PHQ2 0 today.  Hypertension:    Good control on  coreg 12.5 mg BID BP Readings from Last 3 Encounters:  11/09/20 136/88  09/25/20 (!) 141/94  09/18/20 (!) 144/95  Using medication without problems or lightheadedness:  none Chest pain with exertion:none Edema:none Short of breath:none Average home BPs: Other issues:      Vertigo off and on long term, ears itch.. he requests referrla to ENT. Relevant past medical, surgical, family and social history reviewed and updated as indicated. Interim medical history since our last visit reviewed. Allergies and medications reviewed and updated. Outpatient Medications Prior to Visit  Medication Sig Dispense Refill   acetaminophen (TYLENOL) 325 MG tablet Take 2 tablets (650 mg total) by mouth every 6 (six) hours as needed. 30 tablet 0   atorvastatin (LIPITOR) 80 MG tablet Take 1 tablet (80 mg total) by mouth daily. (Patient taking  differently: Take 40 mg by mouth daily.) 90 tablet 3   carvedilol (COREG) 12.5 MG tablet TAKE 1 TABLET BY MOUTH TWICE A DAY 180 tablet 3   clopidogrel (PLAVIX) 75 MG tablet TAKE 1 TABLET BY MOUTH EVERY DAY 90 tablet 1   docusate sodium (COLACE) 100 MG capsule Take 1 capsule (100 mg total) by mouth 2 (two) times daily. 10 capsule 0   feeding supplement (ENSURE IMMUNE HEALTH) LIQD Take 237 mLs by mouth in the morning.     fluocinonide (LIDEX) 0.05 % external solution Apply 1 application topically at bedtime as needed (psoriasis).     hydrocortisone cream 1 % Apply 1 application topically 2 (two) times daily as needed for itching.     nitroGLYCERIN (NITROSTAT) 0.4 MG SL tablet Place 1 tablet (0.4 mg total) under the tongue every 5 (five) minutes x 3 doses as needed for chest pain. 30 tablet 0   omeprazole (PRILOSEC) 20 MG capsule TAKE 1 CAPSULE BY MOUTH EVERY DAY 90 capsule 3   oxyCODONE (OXY IR/ROXICODONE) 5 MG immediate release tablet Take 1 tablet (5 mg total) by mouth every 6 (six) hours as needed for moderate pain. 30 tablet 0   triamcinolone (KENALOG) 0.1 % Apply 1 application topically 3 (three) times daily as needed (psoriasis).     venlafaxine XR (EFFEXOR-XR) 37.5 MG 24 hr capsule TAKE ONE TABLET DAILY IN ADDITION TO 75 MG TABLETS 90 capsule 0   venlafaxine XR (EFFEXOR-XR) 75 MG 24  hr capsule TAKE 1 CAPSULE BY MOUTH DAILY WITH BREAKFAST. 90 capsule 1   gabapentin (NEURONTIN) 300 MG capsule Take 1 capsule (300 mg total) by mouth 2 (two) times daily for 5 days. 10 capsule 0   No facility-administered medications prior to visit.     Per HPI unless specifically indicated in ROS section below Review of Systems  Constitutional:  Negative for fatigue and fever.  HENT:  Negative for ear pain.   Eyes:  Negative for pain.  Respiratory:  Negative for cough and shortness of breath.   Cardiovascular:  Negative for chest pain, palpitations and leg swelling.  Gastrointestinal:  Negative for abdominal  pain.  Genitourinary:  Negative for dysuria.  Musculoskeletal:  Negative for arthralgias.  Neurological:  Negative for syncope, light-headedness and headaches.  Psychiatric/Behavioral:  Negative for dysphoric mood.   Objective:  BP 136/88   Pulse 86   Temp 97.6 F (36.4 C) (Temporal)   Ht 5\' 6"  (1.676 m)   Wt 189 lb (85.7 kg)   SpO2 95%   BMI 30.51 kg/m   Wt Readings from Last 3 Encounters:  11/09/20 189 lb (85.7 kg)  09/24/20 192 lb (87.1 kg)  09/18/20 190 lb (86.2 kg)      Physical Exam Constitutional:      Appearance: He is well-developed.  HENT:     Head: Normocephalic.     Right Ear: Hearing normal.     Left Ear: Hearing normal.     Nose: Nose normal.  Neck:     Thyroid: No thyroid mass or thyromegaly.     Vascular: No carotid bruit.     Trachea: Trachea normal.  Cardiovascular:     Rate and Rhythm: Normal rate and regular rhythm.     Pulses: Normal pulses.     Heart sounds: Heart sounds not distant. No murmur heard.   No friction rub. No gallop.     Comments: No peripheral edema Pulmonary:     Effort: Pulmonary effort is normal. No respiratory distress.     Breath sounds: Normal breath sounds.  Skin:    General: Skin is warm and dry.     Findings: No rash.  Psychiatric:        Speech: Speech normal.        Behavior: Behavior normal.        Thought Content: Thought content normal.      Results for orders placed or performed during the hospital encounter of 09/24/20  Glucose, capillary  Result Value Ref Range   Glucose-Capillary 153 (H) 70 - 99 mg/dL   Comment 1 Notify RN   CBC  Result Value Ref Range   WBC 10.9 (H) 4.0 - 10.5 K/uL   RBC 4.48 4.22 - 5.81 MIL/uL   Hemoglobin 14.0 13.0 - 17.0 g/dL   HCT 41.0 39.0 - 52.0 %   MCV 91.5 80.0 - 100.0 fL   MCH 31.3 26.0 - 34.0 pg   MCHC 34.1 30.0 - 36.0 g/dL   RDW 12.6 11.5 - 15.5 %   Platelets 146 (L) 150 - 400 K/uL   nRBC 0.0 0.0 - 0.2 %  Basic metabolic panel  Result Value Ref Range   Sodium 136  135 - 145 mmol/L   Potassium 4.2 3.5 - 5.1 mmol/L   Chloride 103 98 - 111 mmol/L   CO2 26 22 - 32 mmol/L   Glucose, Bld 150 (H) 70 - 99 mg/dL   BUN 14 8 - 23 mg/dL   Creatinine,  Ser 1.01 0.61 - 1.24 mg/dL   Calcium 8.7 (L) 8.9 - 10.3 mg/dL   GFR, Estimated >60 >60 mL/min   Anion gap 7 5 - 15  Magnesium  Result Value Ref Range   Magnesium 1.7 1.7 - 2.4 mg/dL    This visit occurred during the SARS-CoV-2 public health emergency.  Safety protocols were in place, including screening questions prior to the visit, additional usage of staff PPE, and extensive cleaning of exam room while observing appropriate contact time as indicated for disinfecting solutions.   COVID 19 screen:  No recent travel or known exposure to COVID19 The patient denies respiratory symptoms of COVID 19 at this time. The importance of social distancing was discussed today.   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      Depression, major, recurrent ([facility])    Stable, chronic.  Continue current medication.    Venlafaxine 112 mcg daily      Dyslipidemia    Due for re-eval.      Essential hypertension    Stable, chronic.  Continue current medication.   Good control on  coreg 12.5 mg BID      Vertigo - Primary    Chronic, requests referral to ENT.      Relevant Orders   Ambulatory referral to ENT   Other Visit Diagnoses     High cholesterol       Relevant Orders   Lipid panel          Eliezer Lofts, MD

## 2020-11-09 NOTE — Assessment & Plan Note (Signed)
Stable, chronic.  Continue current medication.    Venlafaxine 112 mcg daily

## 2020-11-09 NOTE — Assessment & Plan Note (Signed)
Stable control with diet.  

## 2020-11-09 NOTE — Assessment & Plan Note (Signed)
Chronic, requests referral to ENT.

## 2020-11-10 LAB — LIPID PANEL
Cholesterol: 212 mg/dL — ABNORMAL HIGH (ref <200)
HDL: 62 mg/dL (ref >=40)
Non-HDL Cholesterol (Calc): 150 mg/dL (calc) — ABNORMAL HIGH (ref <130)
Total CHOL/HDL Ratio: 3.4 (calc) (ref <5.0)
Triglycerides: 569 mg/dL — ABNORMAL HIGH (ref <150)

## 2020-11-12 DIAGNOSIS — I2510 Atherosclerotic heart disease of native coronary artery without angina pectoris: Secondary | ICD-10-CM

## 2020-11-12 DIAGNOSIS — I10 Essential (primary) hypertension: Secondary | ICD-10-CM | POA: Diagnosis not present

## 2020-11-20 ENCOUNTER — Other Ambulatory Visit: Payer: Self-pay | Admitting: Cardiovascular Disease

## 2020-11-21 ENCOUNTER — Other Ambulatory Visit: Payer: Self-pay | Admitting: Cardiovascular Disease

## 2020-12-10 ENCOUNTER — Other Ambulatory Visit: Payer: Self-pay | Admitting: *Deleted

## 2020-12-10 ENCOUNTER — Other Ambulatory Visit: Payer: Self-pay | Admitting: Family Medicine

## 2020-12-10 MED ORDER — ATORVASTATIN CALCIUM 40 MG PO TABS
40.0000 mg | ORAL_TABLET | Freq: Every day | ORAL | 3 refills | Status: DC
Start: 2020-12-10 — End: 2021-05-10

## 2020-12-10 NOTE — Telephone Encounter (Signed)
Roy [name] is asking for Rx for atorvastatin 40 mg so he doesn't have to cut the 80 mg tablet in half.  Rx sent as requested. FYI to Dr. Diona Browner.

## 2020-12-20 ENCOUNTER — Ambulatory Visit: Payer: Medicare HMO

## 2020-12-20 DIAGNOSIS — I10 Essential (primary) hypertension: Secondary | ICD-10-CM

## 2020-12-20 DIAGNOSIS — Z9181 History of falling: Secondary | ICD-10-CM

## 2020-12-20 DIAGNOSIS — I2510 Atherosclerotic heart disease of native coronary artery without angina pectoris: Secondary | ICD-10-CM

## 2020-12-20 NOTE — Chronic Care Management (AMB) (Signed)
Chronic Care Management   CCM RN Visit Note  12/20/2020 Name: BROCK LARMON MRN: [id number] DOB: 04/19/1945  Subjective: Roy [name] is a 75 y.o. year old male who is a primary care patient of [name], [name], MD. The care management team was consulted for assistance with disease management and care coordination needs.    Engaged with patient by telephone for follow up visit in response to provider referral for case management and/or care coordination services.   Consent to Services:  The patient was given information about Chronic Care Management services, agreed to services, and gave verbal consent prior to initiation of services.  Please see initial visit note for detailed documentation.   Patient agreed to services and verbal consent obtained.   Assessment: Review of patient past medical history, allergies, medications, health status, including review of consultants reports, laboratory and other test data, was performed as part of comprehensive evaluation and provision of chronic care management services.   SDOH (Social Determinants of Health) assessments and interventions performed:    CCM Care Plan  No Known Allergies  Outpatient Encounter Medications as of 12/20/2020  Medication Sig Note   acetaminophen (TYLENOL) 325 MG tablet Take 2 tablets (650 mg total) by mouth every 6 (six) hours as needed.    atorvastatin (LIPITOR) 40 MG tablet Take 1 tablet (40 mg total) by mouth daily. 12/20/2020: Patient states he is now taking 80 mg 1 x per day.    carvedilol (COREG) 12.5 MG tablet TAKE 1 TABLET BY MOUTH TWICE A DAY    clopidogrel (PLAVIX) 75 MG tablet TAKE 1 TABLET BY MOUTH EVERY DAY    co-enzyme Q-10 30 MG capsule Take 30 mg by mouth daily.    docusate sodium (COLACE) 100 MG capsule Take 1 capsule (100 mg total) by mouth 2 (two) times daily.    fluocinonide (LIDEX) 0.05 % external solution Apply 1 application topically at bedtime as needed (psoriasis).    hydrocortisone  cream 1 % Apply 1 application topically 2 (two) times daily as needed for itching.    nitroGLYCERIN (NITROSTAT) 0.4 MG SL tablet Place 1 tablet (0.4 mg total) under the tongue every 5 (five) minutes x 3 doses as needed for chest pain.    omeprazole (PRILOSEC) 20 MG capsule TAKE 1 CAPSULE BY MOUTH EVERY DAY    oxyCODONE (OXY IR/ROXICODONE) 5 MG immediate release tablet Take 1 tablet (5 mg total) by mouth every 6 (six) hours as needed for moderate pain.    triamcinolone (KENALOG) 0.1 % Apply 1 application topically 3 (three) times daily as needed (psoriasis).    venlafaxine XR (EFFEXOR-XR) 37.5 MG 24 hr capsule TAKE ONE CAPSULE DAILY IN ADDITION TO 75 MG TABLETS    venlafaxine XR (EFFEXOR-XR) 75 MG 24 hr capsule TAKE 1 CAPSULE BY MOUTH DAILY WITH BREAKFAST.    feeding supplement (ENSURE IMMUNE HEALTH) LIQD Take 237 mLs by mouth in the morning.    gabapentin (NEURONTIN) 300 MG capsule Take 1 capsule (300 mg total) by mouth 2 (two) times daily for 5 days.    No facility-administered encounter medications on file as of 12/20/2020.    Patient Active Problem List   Diagnosis Date Noted   Vertigo 11/09/2020   S/P hernia surgery 09/24/2020   Spinal stenosis 05/08/2020   Imbalance 05/08/2020   History of colostomy reversal 03/07/2020   Controlled type 2 diabetes mellitus with circulatory disorder ([place]) 07/23/2019   Insomnia 07/23/2019   Personal history of multiple sclerosis ([place]) 12/02/2018  Weakness 03/31/2017   Internal hemorrhoids with complication 81/85/6314   CAD, RCA PCI 2001, urgent Dx2 DES 04/24/14 04/24/2014   Unstable angina with ST elelvation and NSVT while on treadmill 04/24/14    History of basal cell carcinoma 03/07/2014   GERD (gastroesophageal reflux disease) 03/07/2014   Incomplete emptying of bladder 03/07/2014   Obstructive sleep apnea 08/20/2009   Dyslipidemia 11/09/2008   Depression, major, recurrent ([place]) 07/06/2007   Essential hypertension 07/06/2007   Coronary artery  disease involving native coronary artery of native heart without angina pectoris 07/06/2007    Conditions to be addressed/monitored:CAD, HTN, and Falls   Care Plan : [facility] plan of care  Updates made by Dannielle Karvonen, RN since 12/20/2020 12:00 AM   Problem: chronic disease management education and/ or care coordination   Priority: High   Long-Range Goal: Development of plan of care to address chronic disease management and/ or care coordination needs.   Start Date: 12/20/2020  Expected End Date: 04/12/2021  Priority: High  Current Barriers:  Knowledge Deficits related to plan of care for management of CAD, HTN, and falls  Chronic Disease Management support and education needs related to CAD, HTN, and falls Reviewed recent cholesterol lab results with patient. Lab from 11/09/2020 Total cholesterol 212, Triglycerides 569.   Patient states his primary care provider increased his atorvastatin to 80 mg Qd and placed him on CoQ10 and fish oil. Patient states he heard through media outlet that fish oil did not work therefore he stopped taking it.  Patient states his gait/ balance issues are somewhat better but not 100%.  Denies any falls since last outreach from [facility].  Patient states he requested an ENT referral due to dizziness and discharge from ears.  Patient states he is not checking his blood pressures. RNCM Clinical Goal(s):  Patient will verbalize basic understanding of CAD, HTN, and falls disease process and self health management plan as evidenced by patient report and/ or notation in chart take all medications exactly as prescribed and will call provider for medication related questions as evidenced by patient report and/ or notation in chart    attend all scheduled medical appointments:   as evidenced by patient report and/ or notation in chart        continue to work with RN Care Manager and/or Social Worker to address care management and care coordination needs related to CAD, HTN, and  falls as evidenced by adherence to CM Team Scheduled appointments     through collaboration with Consulting civil engineer, provider, and care team.   Interventions: 1:1 collaboration with primary care provider regarding development and update of comprehensive plan of care as evidenced by provider attestation and co-signature Inter-disciplinary care team collaboration (see longitudinal plan of care) Evaluation of current treatment plan related to  self management and patient's adherence to plan as established by provider   CAD Interventions: (Status:  Goal on track:  Yes.) Long Term Goal Assessed understanding of CAD diagnosis Medications reviewed including medications utilized in CAD treatment plan Provided education on Importance of limiting foods high in cholesterol Reviewed Importance of taking all medications as prescribed Reviewed Importance of attending all scheduled provider appointments   Falls Interventions:  (Status:  Goal on track:  Yes.) Long Term Goal Advised patient of importance of notifying provider of falls Assessed for falls since last encounter Assessed patients knowledge of fall risk prevention secondary to previously provided education   Hypertension Interventions:  (Status:  Goal on track:  Yes.) Long  Term Goal Last practice recorded BP readings:  BP Readings from Last 3 Encounters:  11/09/20 136/88  09/25/20 (!) 141/94  09/18/20 (!) 144/95  Most recent eGFR/CrCl: No results found for: EGFR  No components found for: CRCL  Evaluation of current treatment plan related to hypertension self management and patient's adherence to plan as established by provider Reviewed medications with patient and discussed importance of compliance Discussed plans with patient for ongoing care management follow up and provided patient with direct contact information for care management team Reviewed scheduled/upcoming provider appointments including:    Patient Goals/Self-Care  Activities: Take medications as prescribed   Attend all scheduled provider appointments Call pharmacy for medication refills 3-7 days in advance of running out of medications Call provider office for new concerns or questions  - always keep your cell phone with you.  - Keep walkways clear of clutter and avoid throw rugs or use non slip rugs -  Continue to watch for signs of a heart attack: chest pain or discomfort, feeling weak, lightheaded or faint, pain or discomfort in the jaw, neck or back, Pain or discomfort in one or both arms or shoulders, Shortness of breath. - Call 911 for severe symptoms -  Plan to monitor your blood pressure at least 1 time per week.   Take these reading to your provider appointment.    -  Continue to adhere to a heart healthy diet consisting of fruits, vegetables and whole grains - and low in saturated fat, cholesterol, sodium (salt) and added sugar. Follow low salt / DASH diet         Plan:The patient has been provided with contact information for the care management team and has been advised to call with any health related questions or concerns.  The care management team will reach out to the patient again over the next 3 months. Quinn Plowman RN,BSN,CCM RN Case Manager [place]  [phone]

## 2020-12-20 NOTE — Patient Instructions (Signed)
Visit Information  Thank you for taking time to visit with me today. Please don't hesitate to contact me if I can be of assistance to you before our next scheduled telephone appointment.   Patient Goals/Self-Care Activities: Take medications as prescribed   Attend all scheduled provider appointments Call pharmacy for medication refills 3-7 days in advance of running out of medications Call provider office for new concerns or questions  - always keep your cell phone with you.  - Keep walkways clear of clutter and avoid throw rugs or use non slip rugs -  Continue to watch for signs of a heart attack: chest pain or discomfort, feeling weak, lightheaded or faint, pain or discomfort in the jaw, neck or back, Pain or discomfort in one or both arms or shoulders, Shortness of breath. - Call 911 for severe symptoms -  Plan to monitor your blood pressure at least 1 time per week.   Take these reading to your provider appointment.    -  Continue to adhere to a heart healthy diet consisting of fruits, vegetables and whole grains - and low in saturated fat, cholesterol, sodium (salt) and added sugar. Follow low salt / DASH diet  Our next appointment is by telephone on March 05, 2021 at 3:00 pm  Please call the care guide team at [phone] if you need to cancel or reschedule your appointment.   If you are experiencing a Mental Health or [place] or need someone to talk to, please call the Suicide and Crisis Lifeline: 988 call the [place] National Suicide Prevention Lifeline: (325)516-0407 or TTY: (669) 084-8590 TTY 225-824-3826) to talk to a trained counselor call 1-800-273-TALK (toll free, 24 hour hotline)   Patient verbalizes understanding of instructions provided today and agrees to view in [place].   Quinn Plowman RN,BSN,CCM RN Case Manager [place]  [phone]

## 2021-01-02 DIAGNOSIS — H903 Sensorineural hearing loss, bilateral: Secondary | ICD-10-CM | POA: Diagnosis not present

## 2021-01-02 DIAGNOSIS — R2689 Other abnormalities of gait and mobility: Secondary | ICD-10-CM | POA: Diagnosis not present

## 2021-01-03 ENCOUNTER — Telehealth: Payer: Medicare HMO

## 2021-01-17 DIAGNOSIS — C44719 Basal cell carcinoma of skin of left lower limb, including hip: Secondary | ICD-10-CM | POA: Diagnosis not present

## 2021-01-29 ENCOUNTER — Telehealth: Payer: Self-pay | Admitting: Radiation Oncology

## 2021-01-29 NOTE — Telephone Encounter (Signed)
Called patient to schedule consultation with Dr. Squire. No answer, LVM for return call. 

## 2021-01-30 ENCOUNTER — Telehealth: Payer: Self-pay | Admitting: Radiation Oncology

## 2021-01-30 NOTE — Telephone Encounter (Signed)
Second attempt: Called patient to schedule consultation with Dr. Isidore Moos. No answer, LVM for return call.

## 2021-02-05 NOTE — Progress Notes (Incomplete)
Histology and Location of Primary Skin Cancer:    Roy [name] presented with the following signs/symptoms,  {:19994} months ago: ***  Past/Anticipated interventions by patient's surgeon/dermatologist for current problematic lesion, if any:  01/17/2021 --Dr. Harriett Sine Biopsies to left inferior medial anterior tibial and left superios central anterior ankle  Past skin cancers, if any:   History of Blistering sunburns, if any: ***  SAFETY ISSUES: Prior radiation? *** Pacemaker/ICD? *** Possible current pregnancy? N/A Is the patient on methotrexate? ***  Current Complaints / other details:  ***

## 2021-02-05 NOTE — Progress Notes (Incomplete)
Radiation Oncology         [phone] ________________________________  Initial Outpatient Consultation  Name: Roy [name] MRN: [id number]  Date: 02/06/2021  DOB: [birth year]  [id number], Mervyn Gay, MD  Harriett Sine, MD   REFERRING PHYSICIAN: Harriett Sine, MD  DIAGNOSIS: No diagnosis found.  Basal cell carcinoma - left central anterior ankle, and left inferior medial tibia   Cancer Staging  No matching staging information was found for the patient.  CHIEF COMPLAINT: Here to discuss management of skin cancer  HISTORY OF PRESENT ILLNESS::Roy [name] is a 76 y.o. male with a history of significant sun exposure, who presents today for consideration of radiation therapy in management of multiple sites of [place].  The patient presented with 2 lesions on his left ankle and left tibia to Dr. Elvera Lennox on 01/17/21. Per Dr. Elvera Lennox, the patient has numerous other sites of BCC's on his legs that he does not wish to treat these.   Skin shave biopsies performed by Dr. Elvera Lennox of the left ankle and left tibial lesions on 01/17/21 revealed superficial basal cell carcinoma. Left ankle shave biopsy indicated both nodular and superficial BCC.    PREVIOUS RADIATION THERAPY: {EXAM; YES/NO:19492::"No"}  PAST MEDICAL HISTORY:  has a past medical history of Basal cell carcinoma, CAD (coronary artery disease), History of echocardiogram, HLD (hyperlipidemia), HTN (hypertension), MS (multiple sclerosis) ([place]), OSA (obstructive sleep apnea), Pre-diabetes, and Vertigo.    PAST SURGICAL HISTORY: Past Surgical History:  Procedure Laterality Date   BIOPSY  07/26/2019   Procedure: BIOPSY;  Surgeon: Gatha Mayer, MD;  Location: [facility];  Service: Endoscopy;;   CATARACT EXTRACTION, BILATERAL     COLON RESECTION SIGMOID N/A 08/30/2019   Procedure: SIGMOID COLON RESECTION;  Surgeon: Clovis Riley, MD;  Location: [facility];  Service: General;  Laterality: N/A;   COLONOSCOPY N/A  07/26/2019   Procedure: COLONOSCOPY;  Surgeon: Gatha Mayer, MD;  Location: [facility] ENDOSCOPY;  Service: Endoscopy;  Laterality: N/A;   COLONOSCOPY     COLOSTOMY N/A 08/30/2019   Procedure: COLOSTOMY;  Surgeon: Clovis Riley, MD;  Location: [facility];  Service: General;  Laterality: N/A;   COLOSTOMY TAKEDOWN N/A 03/07/2020   Procedure: LAPAROSCOPIC COLOSTOMY REVERSAL, RIGID PROCTOSCOPY;  Surgeon: Clovis Riley, MD;  Location: [facility];  Service: General;  Laterality: N/A;   CORONARY STENT PLACEMENT  2001    RCA   INCISIONAL HERNIA REPAIR N/A 09/24/2020   Procedure: LAPAROSCOPIC INCISIONAL HERNIA REPAIR WITH MESH;  Surgeon: Clovis Riley, MD;  Location: [facility];  Service: General;  Laterality: N/A;   LEFT HEART CATHETERIZATION WITH CORONARY ANGIOGRAM N/A 02/13/2012   Procedure: LEFT HEART CATHETERIZATION WITH CORONARY ANGIOGRAM;  Surgeon: [name], MD;  Location: [facility] CATH LAB;  Service: Cardiovascular;  Laterality: N/A;   LEFT HEART CATHETERIZATION WITH CORONARY ANGIOGRAM N/A 04/24/2014   Procedure: LEFT HEART CATHETERIZATION WITH CORONARY ANGIOGRAM;  Surgeon: [name], MD;  Location: [facility] CATH LAB;  Service: Cardiovascular;  Laterality: N/A;   LYSIS OF ADHESION N/A 03/07/2020   Procedure: LYSIS OF ADHESION;  Surgeon: Clovis Riley, MD;  Location: [facility];  Service: General;  Laterality: N/A;   TONSILLECTOMY      FAMILY HISTORY: family history includes Alcohol abuse in his mother; Cirrhosis in his mother; Coronary artery disease in his father; Depression in his sister; Heart disease in his father; Hypertension in his father; Pancreatic cancer in his mother; Peripheral vascular disease in his father.  SOCIAL HISTORY:  reports  that he quit smoking about 21 years ago. His smoking use included cigarettes. He has a 25.00 pack-year smoking history. He has never used smokeless tobacco. He reports current alcohol use of about 3.0 standard drinks per week. He reports that he does not use  drugs.  ALLERGIES: Patient has no known allergies.  MEDICATIONS:  Current Outpatient Medications  Medication Sig Dispense Refill   acetaminophen (TYLENOL) 325 MG tablet Take 2 tablets (650 mg total) by mouth every 6 (six) hours as needed. 30 tablet 0   atorvastatin (LIPITOR) 40 MG tablet Take 1 tablet (40 mg total) by mouth daily. 90 tablet 3   carvedilol (COREG) 12.5 MG tablet TAKE 1 TABLET BY MOUTH TWICE A DAY 180 tablet 1   clopidogrel (PLAVIX) 75 MG tablet TAKE 1 TABLET BY MOUTH EVERY DAY 90 tablet 1   co-enzyme Q-10 30 MG capsule Take 30 mg by mouth daily.     docusate sodium (COLACE) 100 MG capsule Take 1 capsule (100 mg total) by mouth 2 (two) times daily. 10 capsule 0   feeding supplement (ENSURE IMMUNE HEALTH) LIQD Take 237 mLs by mouth in the morning.     fluocinonide (LIDEX) 0.05 % external solution Apply 1 application topically at bedtime as needed (psoriasis).     gabapentin (NEURONTIN) 300 MG capsule Take 1 capsule (300 mg total) by mouth 2 (two) times daily for 5 days. 10 capsule 0   hydrocortisone cream 1 % Apply 1 application topically 2 (two) times daily as needed for itching.     nitroGLYCERIN (NITROSTAT) 0.4 MG SL tablet Place 1 tablet (0.4 mg total) under the tongue every 5 (five) minutes x 3 doses as needed for chest pain. 30 tablet 0   omeprazole (PRILOSEC) 20 MG capsule TAKE 1 CAPSULE BY MOUTH EVERY DAY 90 capsule 1   oxyCODONE (OXY IR/ROXICODONE) 5 MG immediate release tablet Take 1 tablet (5 mg total) by mouth every 6 (six) hours as needed for moderate pain. 30 tablet 0   triamcinolone (KENALOG) 0.1 % Apply 1 application topically 3 (three) times daily as needed (psoriasis).     venlafaxine XR (EFFEXOR-XR) 37.5 MG 24 hr capsule TAKE ONE CAPSULE DAILY IN ADDITION TO 75 MG TABLETS 90 capsule 1   venlafaxine XR (EFFEXOR-XR) 75 MG 24 hr capsule TAKE 1 CAPSULE BY MOUTH DAILY WITH BREAKFAST. 90 capsule 1   No current facility-administered medications for this encounter.     REVIEW OF SYSTEMS:  Notable for that above.   PHYSICAL EXAM:  vitals were not taken for this visit.   General: Alert and oriented, in no acute distress *** HEENT: Head is normocephalic. Extraocular movements are intact. Oropharynx is clear. Neck: Neck is supple, no palpable cervical or supraclavicular lymphadenopathy. Heart: Regular in rate and rhythm with no murmurs, rubs, or gallops. Chest: Clear to auscultation bilaterally, with no rhonchi, wheezes, or rales. Abdomen: Soft, nontender, nondistended, with no rigidity or guarding. Extremities: No cyanosis or edema. Lymphatics: see Neck Exam Skin: No concerning lesions. Musculoskeletal: symmetric strength and muscle tone throughout. Neurologic: Cranial nerves II through XII are grossly intact. No obvious focalities. Speech is fluent. Coordination is intact. Psychiatric: Judgment and insight are intact. Affect is appropriate.   ECOG = ***  0 - Asymptomatic (Fully active, able to carry on all predisease activities without restriction)  1 - Symptomatic but completely ambulatory (Restricted in physically strenuous activity but ambulatory and able to carry out work of a light or sedentary nature. For example, light housework, office  work)  2 - Symptomatic, <50% in bed during the day (Ambulatory and capable of all self care but unable to carry out any work activities. Up and about more than 50% of waking hours)  3 - Symptomatic, >50% in bed, but not bedbound (Capable of only limited self-care, confined to bed or chair 50% or more of waking hours)  4 - Bedbound (Completely disabled. Cannot carry on any self-care. Totally confined to bed or chair)  5 - Death   Eustace Pen MM, Creech RH, Tormey DC, et al. [phone]). "Toxicity and response criteria of the [facility]". [place] Oncol. 5 (6): 649-55   LABORATORY DATA:  Lab Results  Component Value Date   WBC 10.9 (H) 09/25/2020   HGB 14.0 09/25/2020   HCT 41.0  09/25/2020   MCV 91.5 09/25/2020   PLT 146 (L) 09/25/2020   CMP     Component Value Date/Time   NA 136 09/25/2020 0350   K 4.2 09/25/2020 0350   CL 103 09/25/2020 0350   CO2 26 09/25/2020 0350   GLUCOSE 150 (H) 09/25/2020 0350   GLUCOSE 118 (H) 12/08/2005 0741   BUN 14 09/25/2020 0350   CREATININE 1.01 09/25/2020 0350   CREATININE 1.11 04/02/2015 1514   CALCIUM 8.7 (L) 09/25/2020 0350   PROT 6.7 05/02/2020 0929   PROT 6.6 03/26/2016 0736   ALBUMIN 3.8 05/02/2020 0929   ALBUMIN 4.0 03/26/2016 0736   AST 19 05/02/2020 0929   ALT 25 05/02/2020 0929   ALKPHOS 60 05/02/2020 0929   BILITOT 0.4 05/02/2020 0929   BILITOT 0.3 03/26/2016 0736   GFRNONAA >60 09/25/2020 0350   GFRAA >60 09/06/2019 0556         RADIOGRAPHY: No results found.    IMPRESSION/PLAN:***    On date of service, in total, I spent *** minutes on this encounter. Patient was seen in person.   __________________________________________   Eppie Gibson, MD  This document serves as a record of services personally performed by Eppie Gibson, MD. It was created on her behalf by Roney Mans, a trained medical scribe. The creation of this record is based on the scribe's personal observations and the provider's statements to them. This document has been checked and approved by the attending provider.

## 2021-02-06 ENCOUNTER — Telehealth: Payer: Self-pay

## 2021-02-06 ENCOUNTER — Ambulatory Visit: Payer: Medicare HMO

## 2021-02-06 ENCOUNTER — Ambulatory Visit
Admission: RE | Admit: 2021-02-06 | Discharge: 2021-02-06 | Disposition: A | Discharge status: Home / Self Care | Payer: Medicare HMO | Source: Ambulatory Visit | Attending: Radiation Oncology | Admitting: Radiation Oncology

## 2021-02-06 NOTE — Telephone Encounter (Signed)
Left VM and sent MyChart message to see if patient was aware of his consultation with Dr. Isidore Moos this morning and still planned to attend, or needed to reschedule. Direct call back number provided

## 2021-02-07 ENCOUNTER — Encounter: Payer: Self-pay | Admitting: *Deleted

## 2021-02-07 DIAGNOSIS — Z006 Encounter for examination for normal comparison and control in clinical research program: Secondary | ICD-10-CM

## 2021-02-07 NOTE — Research (Signed)
Spoke with Roy [name] about Core research study. He states he is not interested

## 2021-02-10 NOTE — Progress Notes (Signed)
Radiation Oncology         [phone] ________________________________  Initial Outpatient Consultation  Name: Roy [name] MRN: [id number]  Date: 02/12/2021  DOB: [birth year]  QM:[id number], Mervyn Gay, MD  Harriett Sine, MD   REFERRING PHYSICIAN: Harriett Sine, MD  DIAGNOSIS:    ICD-10-CM   1. Basal cell carcinoma (BCC) of left lower leg  C44.719     2. Basal cell carcinoma (BCC) of skin of left ankle  C44.719     3. Basal cell carcinoma (BCC), unspecified site  C44.91      cT1 N0 M0 STAGE I  Basal cell carcinoma - left central anterior ankle, and left inferior medial tibia   Cancer Staging  No matching staging information was found for the patient.  CHIEF COMPLAINT: Here to discuss management of skin cancer  HISTORY OF PRESENT ILLNESS::Roy [name] is a 76 y.o. male with a history of significant sun exposure, who presents today for consideration of radiation therapy in management of multiple sites of [place].  The patient presented with 2 lesions on his left ankle and left tibia to Dr. Elvera Lennox on 01/17/21. Per Dr. Elvera Lennox, the patient has numerous other sites of BCC's on his legs that he does not wish to treat these.   Skin shave biopsies performed by Dr. Elvera Lennox of the left ankle and left tibial lesions on 01/17/21 revealed superficial basal cell carcinoma. Left ankle shave biopsy indicated both nodular and superficial BCC.    He spends a lot of time in the sun in [place] and [place].  He reports that Dr Elvera Lennox did not recommend surgery to his leg due to concerns re: healing issues.  PREVIOUS RADIATION THERAPY: No  PAST MEDICAL HISTORY:  has a past medical history of Basal cell carcinoma, CAD (coronary artery disease), History of echocardiogram, HLD (hyperlipidemia), HTN (hypertension), MS (multiple sclerosis) ([place]), OSA (obstructive sleep apnea), Pre-diabetes, and Vertigo.    PAST SURGICAL HISTORY: Past Surgical History:  Procedure Laterality Date    BIOPSY  07/26/2019   Procedure: BIOPSY;  Surgeon: Gatha Mayer, MD;  Location: [facility];  Service: Endoscopy;;   CATARACT EXTRACTION, BILATERAL     COLON RESECTION SIGMOID N/A 08/30/2019   Procedure: SIGMOID COLON RESECTION;  Surgeon: Clovis Riley, MD;  Location: [facility];  Service: General;  Laterality: N/A;   COLONOSCOPY N/A 07/26/2019   Procedure: COLONOSCOPY;  Surgeon: Gatha Mayer, MD;  Location[facility] ENDOSCOPY;  Service: Endoscopy;  Laterality: N/A;   COLONOSCOPY     COLOSTOMY N/A 08/30/2019   Procedure: COLOSTOMY;  Surgeon: Clovis Riley, MD;  Location: [place];  Service: General;  Laterality: N/A;   COLOSTOMY TAKEDOWN N/A 03/07/2020   Procedure: LAPAROSCOPIC COLOSTOMY REVERSAL, RIGID PROCTOSCOPY;  Surgeon: Clovis Riley, MD;  Location: [facility];  Service: General;  Laterality: N/A;   CORONARY STENT PLACEMENT  2001    RCA   INCISIONAL HERNIA REPAIR N/A 09/24/2020   Procedure: LAPAROSCOPIC INCISIONAL HERNIA REPAIR WITH MESH;  Surgeon: Clovis Riley, MD;  Location: [facility];  Service: General;  Laterality: N/A;   LEFT HEART CATHETERIZATION WITH CORONARY ANGIOGRAM N/A 02/13/2012   Procedure: LEFT HEART CATHETERIZATION WITH CORONARY ANGIOGRAM;  Surgeon: [name], MD;  Location[facility] C[facility] LAB;  Service: Cardiovascular;  Laterality: N/A;   LEFT HEART CATHETERIZATION WITH CORONARY ANGIOGRAM N/A 04/24/2014   Procedure: LEFT HEART CATHETERIZATION WITH CORONARY ANGIOGRAM;  Surgeon: [name], MD;  Location: [facility] CATH LAB;  Service: Cardiovascular;  Laterality: N/A;  LYSIS OF ADHESION N/A 03/07/2020   Procedure: LYSIS OF ADHESION;  Surgeon: Clovis Riley, MD;  Location: [facility];  Service: General;  Laterality: N/A;   TONSILLECTOMY      FAMILY HISTORY: family history includes Alcohol abuse in his mother; Cirrhosis in his mother; Coronary artery disease in his father; Depression in his sister; Heart disease in his father; Hypertension in his father; Pancreatic cancer in his  mother; Peripheral vascular disease in his father.  SOCIAL HISTORY:  reports that he quit smoking about 21 years ago. His smoking use included cigarettes. He has a 25.00 pack-year smoking history. He has never used smokeless tobacco. He reports current alcohol use of about 3.0 standard drinks per week. He reports that he does not use drugs.  ALLERGIES: Patient has no known allergies.  MEDICATIONS:  Current Outpatient Medications  Medication Sig Dispense Refill   acetaminophen (TYLENOL) 325 MG tablet Take 2 tablets (650 mg total) by mouth every 6 (six) hours as needed. 30 tablet 0   atorvastatin (LIPITOR) 40 MG tablet Take 1 tablet (40 mg total) by mouth daily. 90 tablet 3   carvedilol (COREG) 12.5 MG tablet TAKE 1 TABLET BY MOUTH TWICE A DAY 180 tablet 1   clopidogrel (PLAVIX) 75 MG tablet TAKE 1 TABLET BY MOUTH EVERY DAY 90 tablet 1   co-enzyme Q-10 30 MG capsule Take 30 mg by mouth daily.     docusate sodium (COLACE) 100 MG capsule Take 1 capsule (100 mg total) by mouth 2 (two) times daily. 10 capsule 0   feeding supplement (ENSURE IMMUNE HEALTH) LIQD Take 237 mLs by mouth in the morning.     fluocinonide (LIDEX) 0.05 % external solution Apply 1 application topically at bedtime as needed (psoriasis).     gabapentin (NEURONTIN) 300 MG capsule Take 1 capsule (300 mg total) by mouth 2 (two) times daily for 5 days. 10 capsule 0   hydrocortisone cream 1 % Apply 1 application topically 2 (two) times daily as needed for itching.     nitroGLYCERIN (NITROSTAT) 0.4 MG SL tablet Place 1 tablet (0.4 mg total) under the tongue every 5 (five) minutes x 3 doses as needed for chest pain. 30 tablet 0   omeprazole (PRILOSEC) 20 MG capsule TAKE 1 CAPSULE BY MOUTH EVERY DAY 90 capsule 1   oxyCODONE (OXY IR/ROXICODONE) 5 MG immediate release tablet Take 1 tablet (5 mg total) by mouth every 6 (six) hours as needed for moderate pain. 30 tablet 0   triamcinolone (KENALOG) 0.1 % Apply 1 application topically 3  (three) times daily as needed (psoriasis).     venlafaxine XR (EFFEXOR-XR) 37.5 MG 24 hr capsule TAKE ONE CAPSULE DAILY IN ADDITION TO 75 MG TABLETS 90 capsule 1   venlafaxine XR (EFFEXOR-XR) 75 MG 24 hr capsule TAKE 1 CAPSULE BY MOUTH DAILY WITH BREAKFAST. 90 capsule 1   No current facility-administered medications for this encounter.    REVIEW OF SYSTEMS:  Notable for that above.   PHYSICAL EXAM:  weight is 198 lb (89.8 kg). His blood pressure is 115/87 and his pulse is 98. His respiration is 17 and oxygen saturation is 97%.   General: Alert and oriented, in no acute distress    Skin: diffuse evidence of chronic sun exposure. See Ext exam below. Ext: mild swelling in left ankle with two sores at previous sites of biopsy (medial superior left ankle.) Crusted and scabbed lesions over left pretibial region. See photo. Psychiatric: Judgment and insight are intact. Affect is  appropriate.   ECOG = 1  0 - Asymptomatic (Fully active, able to carry on all predisease activities without restriction)  1 - Symptomatic but completely ambulatory (Restricted in physically strenuous activity but ambulatory and able to carry out work of a light or sedentary nature. For example, light housework, office work)  2 - Symptomatic, <50% in bed during the day (Ambulatory and capable of all self care but unable to carry out any work activities. Up and about more than 50% of waking hours)  3 - Symptomatic, >50% in bed, but not bedbound (Capable of only limited self-care, confined to bed or chair 50% or more of waking hours)  4 - Bedbound (Completely disabled. Cannot carry on any self-care. Totally confined to bed or chair)  5 - Death   Eustace Pen MM, Creech RH, Tormey DC, et al. [id number]). "Toxicity and response criteria of the [facility]". [place] Oncol. 5 (6): 649-55   LABORATORY DATA:  Lab Results  Component Value Date   WBC 10.9 (H) 09/25/2020   HGB 14.0 09/25/2020   HCT 41.0  09/25/2020   MCV 91.5 09/25/2020   PLT 146 (L) 09/25/2020   CMP     Component Value Date/Time   NA 136 09/25/2020 0350   K 4.2 09/25/2020 0350   CL 103 09/25/2020 0350   CO2 26 09/25/2020 0350   GLUCOSE 150 (H) 09/25/2020 0350   GLUCOSE 118 (H) 12/08/2005 0741   BUN 14 09/25/2020 0350   CREATININE 1.01 09/25/2020 0350   CREATININE 1.11 04/02/2015 1514   CALCIUM 8.7 (L) 09/25/2020 0350   PROT 6.7 05/02/2020 0929   PROT 6.6 03/26/2016 0736   ALBUMIN 3.8 05/02/2020 0929   ALBUMIN 4.0 03/26/2016 0736   AST 19 05/02/2020 0929   ALT 25 05/02/2020 0929   ALKPHOS 60 05/02/2020 0929   BILITOT 0.4 05/02/2020 0929   BILITOT 0.3 03/26/2016 0736   GFRNONAA >60 09/25/2020 0350   GFRAA >60 09/06/2019 0556        RADIOGRAPHY: No results found.    IMPRESSION/PLAN: Today, I talked to the patient about the findings and work-up thus far.  We discussed the patient's diagnosis of multiple cases of basal cell carcinoma of the skin and general treatment for this, highlighting the role of radiotherapy in the management.  We discussed the available radiation techniques, and focused on the details of logistics and delivery.  Treatment would take 6 weeks. We discussed the risks, benefits, and side effects of radiotherapy. Side effects may include but not necessarily be limited to: fatigue, skin irritation/ulceration, possible nonhealing serious wound to lower leg. No guarantees of treatment were given. The patient was encouraged to ask questions that I answered to the best of my ability.    In conclusion, I recommend he see medical oncology to consider a systemic option such as a hedgehog pathway inhibitor.  This is partly because radiation carries significant healing risks/ possibility of wound complication like surgery and partly because he has had many cases of basal cell carcinoma over the past several years.   Patient is enthusiastic to talk to med/onc about this. Referral has been placed to Dr.  Alen Blew. I will see him back PRN.   On date of service, in total, I spent 45 minutes on this encounter. Patient was seen in person.  __________________________________________   Eppie Gibson, MD  This document serves as a record of services personally performed by Eppie Gibson, MD. It was created on her behalf by  Roney Mans, a trained medical scribe. The creation of this record is based on the scribe's personal observations and the provider's statements to them. This document has been checked and approved by the attending provider.

## 2021-02-11 NOTE — Progress Notes (Signed)
Histology and Location of Primary Skin Cancer:    Roy [name] presented with the following signs/symptoms: reports he's been dealing with areas of concern for quite awhile   Past/Anticipated interventions by patient's surgeon/dermatologist for current problematic lesion, if any:  01/17/2021 --Dr. Harriett Sine Biopsies to left inferior medial anterior tibial and left superios central anterior ankle   Past skin cancers, if any:   History of Blistering sunburns, if any: Yes    SAFETY ISSUES: Prior radiation? No Pacemaker/ICD? No Possible current pregnancy? N/A Is the patient on methotrexate? No   Current Complaints / other details:  Denies any known family history of cancer

## 2021-02-12 ENCOUNTER — Encounter: Payer: Self-pay | Admitting: Radiation Oncology

## 2021-02-12 ENCOUNTER — Ambulatory Visit
Admission: RE | Admit: 2021-02-12 | Discharge: 2021-02-12 | Disposition: A | Discharge status: Home / Self Care | Payer: Medicare HMO | Source: Ambulatory Visit | Attending: Radiation Oncology | Admitting: Radiation Oncology

## 2021-02-12 ENCOUNTER — Other Ambulatory Visit: Payer: Self-pay

## 2021-02-12 VITALS — BP 115/87 | HR 98 | Resp 17 | Wt 198.0 lb

## 2021-02-12 DIAGNOSIS — C44719 Basal cell carcinoma of skin of left lower limb, including hip: Secondary | ICD-10-CM

## 2021-02-12 DIAGNOSIS — C4491 Basal cell carcinoma of skin, unspecified: Secondary | ICD-10-CM | POA: Insufficient documentation

## 2021-02-12 DIAGNOSIS — Z79899 Other long term (current) drug therapy: Secondary | ICD-10-CM | POA: Diagnosis not present

## 2021-02-12 DIAGNOSIS — Z800 Family history of malignant neoplasm of digestive organs: Secondary | ICD-10-CM | POA: Diagnosis not present

## 2021-02-12 DIAGNOSIS — G4700 Insomnia, unspecified: Secondary | ICD-10-CM | POA: Insufficient documentation

## 2021-02-12 DIAGNOSIS — Z87891 Personal history of nicotine dependence: Secondary | ICD-10-CM | POA: Diagnosis not present

## 2021-02-12 DIAGNOSIS — M069 Rheumatoid arthritis, unspecified: Secondary | ICD-10-CM | POA: Insufficient documentation

## 2021-02-12 DIAGNOSIS — I2510 Atherosclerotic heart disease of native coronary artery without angina pectoris: Secondary | ICD-10-CM | POA: Diagnosis not present

## 2021-02-12 DIAGNOSIS — I10 Essential (primary) hypertension: Secondary | ICD-10-CM | POA: Insufficient documentation

## 2021-02-12 DIAGNOSIS — E785 Hyperlipidemia, unspecified: Secondary | ICD-10-CM | POA: Insufficient documentation

## 2021-02-12 DIAGNOSIS — H903 Sensorineural hearing loss, bilateral: Secondary | ICD-10-CM | POA: Diagnosis not present

## 2021-02-12 DIAGNOSIS — C44711 Basal cell carcinoma of skin of unspecified lower limb, including hip: Secondary | ICD-10-CM | POA: Insufficient documentation

## 2021-02-12 DIAGNOSIS — Z85828 Personal history of other malignant neoplasm of skin: Secondary | ICD-10-CM

## 2021-02-13 ENCOUNTER — Telehealth: Payer: Self-pay | Admitting: Oncology

## 2021-02-13 NOTE — Telephone Encounter (Signed)
Scheduled appt per 1/31 referral. Spoke to pt who is aware of appt date and time. Pt is aware to arrive 20 mins prior to appt time.

## 2021-03-03 ENCOUNTER — Other Ambulatory Visit: Payer: Self-pay | Admitting: Cardiovascular Disease

## 2021-03-05 ENCOUNTER — Ambulatory Visit (INDEPENDENT_AMBULATORY_CARE_PROVIDER_SITE_OTHER): Payer: Medicare HMO

## 2021-03-05 ENCOUNTER — Inpatient Hospital Stay: Payer: Medicare HMO | Admitting: Oncology

## 2021-03-05 ENCOUNTER — Telehealth: Payer: Self-pay | Admitting: *Deleted

## 2021-03-05 DIAGNOSIS — I2510 Atherosclerotic heart disease of native coronary artery without angina pectoris: Secondary | ICD-10-CM

## 2021-03-05 DIAGNOSIS — Z9181 History of falling: Secondary | ICD-10-CM

## 2021-03-05 DIAGNOSIS — C44711 Basal cell carcinoma of skin of unspecified lower limb, including hip: Secondary | ICD-10-CM

## 2021-03-05 DIAGNOSIS — I10 Essential (primary) hypertension: Secondary | ICD-10-CM

## 2021-03-05 NOTE — Patient Instructions (Signed)
Visit Information  Thank you for taking time to visit with me today. Please don't hesitate to contact me if I can be of assistance to you before our next scheduled telephone appointment.  Following are the goals we discussed today:  Continue to take medications as prescribed   Attend all scheduled provider appointments Call pharmacy for medication refills 3-7 days in advance of running out of medications Call provider office for new concerns or questions  - Keep walkways clear of clutter and avoid throw rugs or use non slip rugs - Call 911 for severe symptoms - Reschedule oncology appointment -  Consider monitoring your blood pressure at least 1 time per week.   Take these reading to your provider appointment.    -  Continue to adhere to a heart healthy diet consisting of fruits, vegetables and whole grains - and low in saturated fat, cholesterol, sodium (salt) and added sugar. Follow low salt / DASH diet   Our next appointment is by telephone on 04/23/21 at 3:00 pm  Please call the care guide team at [phone] if you need to cancel or reschedule your appointment.   If you are experiencing a Mental Health or [place] or need someone to talk to, please call the Suicide and Crisis Lifeline: 988 call 1-800-273-TALK (toll free, 24 hour hotline)   Patient verbalizes understanding of instructions and care plan provided today and agrees to view in [place]. Active MyChart status confirmed with patient.    Quinn Plowman RN,BSN,CCM RN Case Manager [place]  [phone]

## 2021-03-05 NOTE — Progress Notes (Signed)
error 

## 2021-03-05 NOTE — Chronic Care Management (AMB) (Signed)
Chronic Care Management   CCM RN Visit Note  03/05/2021 Name: Roy [name] MRN: [id number] DOB: 05/07/1945  Subjective: Roy [name] is a 76 y.o. year old male who is a primary care patient of [name], [name], MD. The care management team was consulted for assistance with disease management and care coordination needs.    Engaged with patient by telephone for follow up visit in response to provider referral for case management and/or care coordination services.   Consent to Services:  The patient was given information about Chronic Care Management services, agreed to services, and gave verbal consent prior to initiation of services.  Please see initial visit note for detailed documentation.   Patient agreed to services and verbal consent obtained.   Assessment: Review of patient past medical history, allergies, medications, health status, including review of consultants reports, laboratory and other test data, was performed as part of comprehensive evaluation and provision of chronic care management services.   SDOH (Social Determinants of Health) assessments and interventions performed:    CCM Care Plan  No Known Allergies  Outpatient Encounter Medications as of 03/05/2021  Medication Sig Note   acetaminophen (TYLENOL) 325 MG tablet Take 2 tablets (650 mg total) by mouth every 6 (six) hours as needed.    atorvastatin (LIPITOR) 40 MG tablet Take 1 tablet (40 mg total) by mouth daily. 12/20/2020: Patient states he is now taking 80 mg 1 x per day.    carvedilol (COREG) 12.5 MG tablet Take 1 tablet (12.5 mg total) by mouth 2 (two) times daily with a meal. Please make yearly appt with Dr. Johnsie Cancel in April 2023 for future refills. Thank you 1st attempt    clopidogrel (PLAVIX) 75 MG tablet TAKE 1 TABLET BY MOUTH EVERY DAY    co-enzyme Q-10 30 MG capsule Take 30 mg by mouth daily.    docusate sodium (COLACE) 100 MG capsule Take 1 capsule (100 mg total) by mouth 2 (two) times daily.     hydrocortisone cream 1 % Apply 1 application topically 2 (two) times daily as needed for itching.    nitroGLYCERIN (NITROSTAT) 0.4 MG SL tablet Place 1 tablet (0.4 mg total) under the tongue every 5 (five) minutes x 3 doses as needed for chest pain.    Omega-3 Fatty Acids (FISH OIL) 1200 MG CPDR Take 1,200 mg by mouth.    omeprazole (PRILOSEC) 20 MG capsule TAKE 1 CAPSULE BY MOUTH EVERY DAY    oxyCODONE (OXY IR/ROXICODONE) 5 MG immediate release tablet Take 1 tablet (5 mg total) by mouth every 6 (six) hours as needed for moderate pain.    triamcinolone (KENALOG) 0.1 % Apply 1 application topically 3 (three) times daily as needed (psoriasis).    venlafaxine XR (EFFEXOR-XR) 37.5 MG 24 hr capsule TAKE ONE CAPSULE DAILY IN ADDITION TO 75 MG TABLETS    venlafaxine XR (EFFEXOR-XR) 75 MG 24 hr capsule TAKE 1 CAPSULE BY MOUTH DAILY WITH BREAKFAST.    feeding supplement (ENSURE IMMUNE HEALTH) LIQD Take 237 mLs by mouth in the morning.    fluocinonide (LIDEX) 0.05 % external solution Apply 1 application topically at bedtime as needed (psoriasis).    gabapentin (NEURONTIN) 300 MG capsule Take 1 capsule (300 mg total) by mouth 2 (two) times daily for 5 days.    No facility-administered encounter medications on file as of 03/05/2021.    Patient Active Problem List   Diagnosis Date Noted   Basal cell carcinoma (BCC) of skin of ankle 02/12/2021  Vertigo 11/09/2020   S/P hernia surgery 09/24/2020   Spinal stenosis 05/08/2020   Imbalance 05/08/2020   History of colostomy reversal 03/07/2020   Controlled type 2 diabetes mellitus with circulatory disorder ([place]) 07/23/2019   Insomnia 07/23/2019   Personal history of multiple sclerosis ([place]) 12/02/2018   Weakness 03/31/2017   Internal hemorrhoids with complication 77/03/4033   CAD, RCA PCI 2001, urgent Dx2 DES 04/24/14 04/24/2014   Unstable angina with ST elelvation and NSVT while on treadmill 04/24/14    History of basal cell carcinoma 03/07/2014   GERD  (gastroesophageal reflux disease) 03/07/2014   Incomplete emptying of bladder 03/07/2014   Obstructive sleep apnea 08/20/2009   Dyslipidemia 11/09/2008   Depression, major, recurrent ([place]) 07/06/2007   Essential hypertension 07/06/2007   Coronary artery disease involving native coronary artery of native heart without angina pectoris 07/06/2007    Conditions to be addressed/monitored:CAD, HTN, and fall, skin cancer  Care Plan : [facility] plan of care  Updates made by Dannielle Karvonen, RN since 03/05/2021 12:00 AM     Problem: chronic disease management education and/ or care coordination   Priority: High     Long-Range Goal: Development of plan of care to address chronic disease management and/ or care coordination needs.   Start Date: 12/20/2020  Expected End Date: 06/12/2021  Priority: High  Note:   Current Barriers:  Knowledge Deficits related to plan of care for management of CAD, HTN, and falls  Chronic Disease Management support and education needs related to CAD, HTN, and falls Patient reports seeing otolaryngologist regarding his balance issues.  Patient states his ears checked out fine.  He reports ordering and receiving new hearing aids.   He states he is trying them out and will follow up with the doctor on 03/15/21.  Patient states he saw  a neurologist regarding his balance issues.  He states he was not pleased with the visit and decided not to return.  He states the imbalance has gotten somewhat better. He states he still has some uncertainty at times but symptoms are not as bad.  Patient denies using assistive device for ambulation. And denies any falls.   Patient reports having basal cell lesion to left lower leg.  He states he was to follow up with the oncologist today but thought his appointment was scheduled for tomorrow.  He states he is waiting to hear back from the office to reschedule. Patient states his doctor increased his cholesterol medication and he is taking Coq10 and fish  oil to treat his elevated cholesterol/ triglycerides. Per chart review patient Is scheduled for his annual wellness exam on 05/10/21.    RNCM Clinical Goal(s):  Patient will verbalize basic understanding of CAD, HTN, and falls disease process and self health management plan as evidenced by patient report and/ or notation in chart take all medications exactly as prescribed and will call provider for medication related questions as evidenced by patient report and/ or notation in chart    attend all scheduled medical appointments:   as evidenced by patient report and/ or notation in chart        continue to work with RN Care Manager and/or Social Worker to address care management and care coordination needs related to CAD, HTN, and falls as evidenced by adherence to CM Team Scheduled appointments     through collaboration with Consulting civil engineer, provider, and care team.   Interventions: 1:1 collaboration with primary care provider regarding development and update of comprehensive plan  of care as evidenced by provider attestation and co-signature Inter-disciplinary care team collaboration (see longitudinal plan of care) Evaluation of current treatment plan related to  self management and patient's adherence to plan as established by provider   CAD Interventions: (Status:  Goal on track:  Yes.) Long Term Goal Medications reviewed including medications utilized in CAD treatment plan Provided education on Importance of limiting foods high in cholesterol Reviewed Importance of taking all medications as prescribed Reviewed Importance of attending all scheduled provider appointments Encouraged patient to follow heart healthy diet.    Falls Interventions:  (Status:  Goal on track:  Yes.) Long Term Goal Advised patient of importance of notifying provider of falls Assessed for falls since last encounter Assessed patients knowledge of fall risk prevention secondary to previously provided education  Skin  (basal cell) cancer:  ( status: New goal):  Long term Reviewed scheduled / upcoming provider appointments Discussed plans with patient for ongoing care management follow up and provided patient with direct contact information for care management team   Hypertension Interventions:  (Status:  Goal on track:  Yes.) Long Term Goal Last practice recorded BP readings:  BP Readings from Last 3 Encounters:  02/12/21 115/87  11/09/20 136/88  09/25/20 (!) 141/94  Most recent eGFR/CrCl: No results found for: EGFR  No components found for: CRCL  Evaluation of current treatment plan related to hypertension self management and patient's adherence to plan as established by provider Reviewed medications with patient and discussed importance of compliance Discussed plans with patient for ongoing care management follow up and provided patient with direct contact information for care management team Reviewed scheduled/upcoming provider appointments i  Encouraged patient to follow low salt diet.   Patient Goals/Self-Care Activities: Continue to take medications as prescribed   Attend all scheduled provider appointments Call pharmacy for medication refills 3-7 days in advance of running out of medications Call provider office for new concerns or questions  - Keep walkways clear of clutter and avoid throw rugs or use non slip rugs - Call 911 for severe symptoms - Reschedule oncology appointment -  Consider monitoring your blood pressure at least 1 time per week.   Take these reading to your provider appointment.    -  Continue to adhere to a heart healthy diet consisting of fruits, vegetables and whole grains - and low in saturated fat, cholesterol, sodium (salt) and added sugar. Follow low salt / DASH diet         Plan:The patient has been provided with contact information for the care management team and has been advised to call with any health related questions or concerns.  The care management team  will reach out to the patient again over the next 2 months . Quinn Plowman RN,BSN,CCM RN Case Manager [place]  [phone]

## 2021-03-05 NOTE — Telephone Encounter (Signed)
PC to patient regarding missed appointment today, patient thought it was tomorrow.  Informed him scheduling will contact him to reschedule.  He verbalizes understanding, scheduling message sent.

## 2021-03-06 ENCOUNTER — Telehealth: Payer: Self-pay | Admitting: Oncology

## 2021-03-06 NOTE — Telephone Encounter (Signed)
Scheduled appt per 1/31 referral. Pt is aware of appt date and time. Pt is aware to arrive 15 mins prior to appt time and to bring and updated insurance card. Pt is aware of appt location.   

## 2021-03-12 DIAGNOSIS — I10 Essential (primary) hypertension: Secondary | ICD-10-CM | POA: Diagnosis not present

## 2021-03-12 DIAGNOSIS — I2510 Atherosclerotic heart disease of native coronary artery without angina pectoris: Secondary | ICD-10-CM | POA: Diagnosis not present

## 2021-03-21 ENCOUNTER — Telehealth: Payer: Self-pay

## 2021-03-21 ENCOUNTER — Other Ambulatory Visit: Payer: Self-pay

## 2021-03-21 ENCOUNTER — Other Ambulatory Visit (HOSPITAL_COMMUNITY): Payer: Self-pay

## 2021-03-21 ENCOUNTER — Inpatient Hospital Stay: Payer: Medicare HMO | Attending: Oncology | Admitting: Oncology

## 2021-03-21 VITALS — BP 134/82 | HR 98 | Temp 97.3°F | Resp 17 | Wt 200.1 lb

## 2021-03-21 DIAGNOSIS — C44711 Basal cell carcinoma of skin of unspecified lower limb, including hip: Secondary | ICD-10-CM

## 2021-03-21 MED ORDER — ERIVEDGE 150 MG PO CAPS
150.0000 mg | ORAL_CAPSULE | Freq: Every day | ORAL | 1 refills | Status: DC
  Filled 2021-03-21: qty 84, 84d supply, fill #0

## 2021-03-21 NOTE — Telephone Encounter (Signed)
Oral Oncology Patient Advocate Encounter ?  ?Received notification from [place] that prior authorization for [employer] is required. ?  ?PA submitted on CoverMyMeds ?Key [id number] ?Status is pending ?  ?Oral Oncology Clinic will continue to follow. ? ?Wynn Maudlin CPHT ?Specialty Pharmacy Patient Advocate ?[place] ?Phone [phone] ?Fax [phone] ?03/21/2021 2:33 PM ? ? ?

## 2021-03-21 NOTE — Progress Notes (Signed)
Reason for the request:    Basal cell carcinoma  HPI: I was asked by Dr. Isidore Moos  to evaluate Roy [name] for evaluation of recurrent basal cell carcinoma.  He is a 76 year old man with history of hypertension, coronary disease as well as recurrent skin cancer.  He is currently under the care of Dr. Elvera Lennox with 2 lesions detected on his left ankle and left tibia that were biopsied and proven to be basal cell carcinoma.  He had recurrent disease dating back to 2015 including the left leg, lower back, right and left tibia that occurred on multiple occasions between 2015 and 2021.  Dr. Isidore Moos evaluated the patient and felt that wound healing will become an issue with radiation and recommended evaluation for systemic therapy.  Clinically, he reports no specific complaints at this time.  He denies any nausea, vomiting or abdominal pain.  He denies any diffuse rash or lesions.     He does not report any headaches, blurry vision, syncope or seizures. Does not report any fevers, chills or sweats.  Does not report any cough, wheezing or hemoptysis.  Does not report any chest pain, palpitation, orthopnea or leg edema.  Does not report any nausea, vomiting or abdominal pain.  Does not report any constipation or diarrhea.  Does not report any skeletal complaints.    Does not report frequency, urgency or hematuria.  Does not report any skin rashes or lesions. Does not report any heat or cold intolerance.  Does not report any lymphadenopathy or petechiae.  Does not report any anxiety or depression.  Remaining review of systems is negative.     Past Medical History:  Diagnosis Date   Basal cell carcinoma    Bilateral Legs   CAD (coronary artery disease)    a.  stent to the RCA in 2001;  b.  Negative Myoview in January 2012;  c.  [place] 4/16:  dLM 20, small D1 50-70, D2 sub-total, oLCx 50-70, RCA stent ok, dPLB 90, EF 35-40% >> DES to D2   History of echocardiogram    a.  Echo 4/16: Mild LVH, EF 55-60%, grade 1  diastolic dysfunction, normal wall motion, MAC   HLD (hyperlipidemia)    HTN (hypertension)    MS (multiple sclerosis) (HCC)    OSA (obstructive sleep apnea)     no cpap    Pre-diabetes    Vertigo   :   Past Surgical History:  Procedure Laterality Date   BIOPSY  07/26/2019   Procedure: BIOPSY;  Surgeon: Gatha Mayer, MD;  Location: [facility];  Service: Endoscopy;;   CATARACT EXTRACTION, BILATERAL     COLON RESECTION SIGMOID N/A 08/30/2019   Procedure: SIGMOID COLON RESECTION;  Surgeon: Clovis Riley, MD;  Location: [facility];  Service: General;  Laterality: N/A;   COLONOSCOPY N/A 07/26/2019   Procedure: COLONOSCOPY;  Surgeon: Gatha Mayer, MD;  Location: [facility] ENDOSCOPY;  Service: Endoscopy;  Laterality: N/A;   COLONOSCOPY     COLOSTOMY N/A 08/30/2019   Procedure: COLOSTOMY;  Surgeon: Clovis Riley, MD;  Location: [facility];  Service: General;  Laterality: N/A;   COLOSTOMY TAKEDOWN N/A 03/07/2020   Procedure: LAPAROSCOPIC COLOSTOMY REVERSAL, RIGID PROCTOSCOPY;  Surgeon: Clovis Riley, MD;  Location: [facility];  Service: General;  Laterality: N/A;   CORONARY STENT PLACEMENT  2001    RCA   INCISIONAL HERNIA REPAIR N/A 09/24/2020   Procedure: LAPAROSCOPIC INCISIONAL HERNIA REPAIR WITH MESH;  Surgeon: Clovis Riley, MD;  Location: [facility];  Service: General;  Laterality: N/A;   LEFT HEART CATHETERIZATION WITH CORONARY ANGIOGRAM N/A 02/13/2012   Procedure: LEFT HEART CATHETERIZATION WITH CORONARY ANGIOGRAM;  Surgeon: [name], MD;  Location: [facility] CATH LAB;  Service: Cardiovascular;  Laterality: N/A;   LEFT HEART CATHETERIZATION WITH CORONARY ANGIOGRAM N/A 04/24/2014   Procedure: LEFT HEART CATHETERIZATION WITH CORONARY ANGIOGRAM;  Surgeon: [name], MD;  Location: [facility] CATH LAB;  Service: Cardiovascular;  Laterality: N/A;   LYSIS OF ADHESION N/A 03/07/2020   Procedure: LYSIS OF ADHESION;  Surgeon: Clovis Riley, MD;  Location: [facility];  Service: General;  Laterality: N/A;    TONSILLECTOMY    :   Current Outpatient Medications:    acetaminophen (TYLENOL) 325 MG tablet, Take 2 tablets (650 mg total) by mouth every 6 (six) hours as needed., Disp: 30 tablet, Rfl: 0   atorvastatin (LIPITOR) 40 MG tablet, Take 1 tablet (40 mg total) by mouth daily., Disp: 90 tablet, Rfl: 3   carvedilol (COREG) 12.5 MG tablet, Take 1 tablet (12.5 mg total) by mouth 2 (two) times daily with a meal. Please make yearly appt with Dr. Johnsie Cancel in April 2023 for future refills. Thank you 1st attempt, Disp: 180 tablet, Rfl: 0   clopidogrel (PLAVIX) 75 MG tablet, TAKE 1 TABLET BY MOUTH EVERY DAY, Disp: 90 tablet, Rfl: 1   co-enzyme Q-10 30 MG capsule, Take 30 mg by mouth daily., Disp: , Rfl:    docusate sodium (COLACE) 100 MG capsule, Take 1 capsule (100 mg total) by mouth 2 (two) times daily., Disp: 10 capsule, Rfl: 0   feeding supplement (ENSURE IMMUNE HEALTH) LIQD, Take 237 mLs by mouth in the morning., Disp: , Rfl:    fluocinonide (LIDEX) 0.05 % external solution, Apply 1 application topically at bedtime as needed (psoriasis)., Disp: , Rfl:    gabapentin (NEURONTIN) 300 MG capsule, Take 1 capsule (300 mg total) by mouth 2 (two) times daily for 5 days., Disp: 10 capsule, Rfl: 0   hydrocortisone cream 1 %, Apply 1 application topically 2 (two) times daily as needed for itching., Disp: , Rfl:    nitroGLYCERIN (NITROSTAT) 0.4 MG SL tablet, Place 1 tablet (0.4 mg total) under the tongue every 5 (five) minutes x 3 doses as needed for chest pain., Disp: 30 tablet, Rfl: 0   Omega-3 Fatty Acids (FISH OIL) 1200 MG CPDR, Take 1,200 mg by mouth., Disp: , Rfl:    omeprazole (PRILOSEC) 20 MG capsule, TAKE 1 CAPSULE BY MOUTH EVERY DAY, Disp: 90 capsule, Rfl: 1   oxyCODONE (OXY IR/ROXICODONE) 5 MG immediate release tablet, Take 1 tablet (5 mg total) by mouth every 6 (six) hours as needed for moderate pain., Disp: 30 tablet, Rfl: 0   triamcinolone (KENALOG) 0.1 %, Apply 1 application topically 3 (three) times  daily as needed (psoriasis)., Disp: , Rfl:    venlafaxine XR (EFFEXOR-XR) 37.5 MG 24 hr capsule, TAKE ONE CAPSULE DAILY IN ADDITION TO 75 MG TABLETS, Disp: 90 capsule, Rfl: 1   venlafaxine XR (EFFEXOR-XR) 75 MG 24 hr capsule, TAKE 1 CAPSULE BY MOUTH DAILY WITH BREAKFAST., Disp: 90 capsule, Rfl: 1:  No Known Allergies:   Family History  Problem Relation Age of Onset   Cirrhosis Mother    Alcohol abuse Mother    Pancreatic cancer Mother    Heart disease Father    Hypertension Father    Peripheral vascular disease Father    Coronary artery disease Father    Depression Sister  Heart attack Neg Hx    Stroke Neg Hx    Stomach cancer Neg Hx    Colon cancer Neg Hx    Esophageal cancer Neg Hx   :   Social History   Socioeconomic History   Marital status: Divorced    Spouse name: Not on file   Number of children: Not on file   Years of education: Not on file   Highest education level: Not on file  Occupational History   Not on file  Tobacco Use   Smoking status: Former    Packs/day: 1.00    Years: 25.00    Pack years: 25.00    Types: Cigarettes    Quit date: 01/14/2000    Years since quitting: 21.1   Smokeless tobacco: Never  Vaping Use   Vaping Use: Never used  Substance and Sexual Activity   Alcohol use: Yes    Alcohol/week: 3.0 standard drinks    Types: 1 Cans of beer, 2 Shots of liquor per week    Comment: 4-5 drinks per week    Drug use: No   Sexual activity: Not Currently  Other Topics Concern   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   Right handed   Drinks caffeine   Single story home   Social Determinants of Health   Financial Resource Strain: Low Risk    Difficulty of Paying Living Expenses: Not hard at all  Food Insecurity: No Food Insecurity   Worried About Charity fundraiser in the Last Year: Never true   Arboriculturist in the Last Year:  Never true  Transportation Needs: No Transportation Needs   Lack of Transportation (Medical): No   Lack of Transportation (Non-Medical): No  Physical Activity: Insufficiently Active   Days of Exercise per Week: 2 days   Minutes of Exercise per Session: 60 min  Stress: No Stress Concern Present   Feeling of Stress : Not at all  Social Connections: Unknown   Frequency of Communication with Friends and Family: Not on file   Frequency of Social Gatherings with Friends and Family: Twice a week   Attends Religious Services: Never   Marine scientist or Organizations: No   Attends Music therapist: Never   Marital Status: Divorced  Human resources officer Violence: Not At Risk   Fear of Current or Ex-Partner: No   Emotionally Abused: No   Physically Abused: No   Sexually Abused: No  :  Pertinent items are noted in HPI.  Exam:  General appearance: alert and cooperative appeared without distress. Head: atraumatic without any abnormalities. Eyes: conjunctivae/corneas clear. PERRL.  Sclera anicteric. Throat: lips, mucosa, and tongue normal; without oral thrush or ulcers. Resp: clear to auscultation bilaterally without rhonchi, wheezes or dullness to percussion. Cardio: regular rate and rhythm, S1, S2 normal, no murmur, click, rub or gallop GI: soft, non-tender; bowel sounds normal; no masses,  no organomegaly Skin: Raised dry and scaly lesions noted in his left lower extremity. Lymph nodes: Cervical, supraclavicular, and axillary nodes normal. Neurologic: Grossly normal without any motor, sensory or deep tendon reflexes. Musculoskeletal: No joint deformity or effusion.   Assessment and Plan:    76 year old with:  1.  Recurrent basal cell carcinoma of the skin that was detected on multiple occasions most recently in January 2023 at the left lower extremity including the  ankle and tibia.  Management options at this time were discussed.  Surgical resection versus radiation  therapy options but reiterated and discussed previously and the wound healing complications have been prohibitive.  The role for systemic therapy were discussed at this time.  Erivedge is a drug that can be used in this setting for recurrent advanced basal cell carcinoma with reasonable response rate and potentially can prevent the need for surgical resection or radiation locally.  Complication associated with this treatment including nausea, vomiting, fatigue, anorexia and weight loss.  After discussion today, he is agreeable to proceed which we will start in the near future.   2.  Follow-up: We will be in the near future to assess response and complication related to his medication.   45  minutes were dedicated to this visit. The time was spent on reviewing pathology results, discussing treatment options, discussing discussing complications related to therapy and answering questions regarding future plan.    Thank you for the referral.  I had the pleasure of meeting this patient today.  A copy of this consult has been forwarded to the requesting physician.

## 2021-03-21 NOTE — Telephone Encounter (Signed)
Oral Oncology Pharmacist Encounter ? ?Received new prescription for vismodegib (Erivedge) for the treatment of basal cell carcinoma, planned duration until disease progression or unacceptable toxicity. Prescription dose and frequency assessed. ? ?Labs from 03/21/2021 (CBC/ CMP) not assessed, standing order. ? ?Current medication list in Epic reviewed, DDIs with Erivedge identified: none ? ?Evaluated chart and no patient barriers to medication adherence noted.  ? ?Patient agreement for treatment documented in MD note on 03/21/21. ? ?Prescription has been e-scribed to the [facility] for benefits analysis and approval. ? ?Oral Oncology Clinic will continue to follow for insurance authorization, copayment issues, initial counseling and start date. ? ?Drema Halon, PharmD ?Hematology/Oncology Clinical Pharmacist ?[facility] ?[phone] ?03/21/2021 2:58 PM ? ? ?

## 2021-03-21 NOTE — Telephone Encounter (Signed)
Oral Oncology Patient Advocate Encounter ? ?Prior Authorization for [facility] has been approved.   ? ?PA# [id number] ?Effective dates: 01/13/21 through 01/12/22 ? ?Patients co-pay is $[id number] ? ?Oral Oncology Clinic will continue to follow.  ? ?Wynn Maudlin CPHT ?Specialty Pharmacy Patient Advocate ?[place] ?Phone [phone] ?Fax [phone] ?03/21/2021 2:46 PM ? ?

## 2021-03-22 ENCOUNTER — Telehealth: Payer: Self-pay

## 2021-03-22 NOTE — Telephone Encounter (Signed)
Oral Oncology Patient Advocate Encounter ? ?Met patient in lobby room to complete application for [employer] in an effort to reduce patient's out of pocket expense for Erivedge to $0.   ? ?Application completed and faxed to [phone] and [phone].  ? ?[employer] patient assistance phone number for follow up is [phone].  ? ?This encounter will be updated until final determination.  ? ?Wynn Maudlin CPHT ?Specialty Pharmacy Patient Advocate ?[place] ?Phone [phone] ?Fax [phone] ?03/22/2021 3:17 PM ? ? ?

## 2021-03-28 NOTE — Telephone Encounter (Signed)
Patient is approved for Erivedge at no cost from [facility] 03/27/21-until ? ?[place] uses [employer] ? ?Wynn Maudlin CPHT ?Specialty Pharmacy Patient Advocate ?[place] ?Phone [phone] ?Fax [phone] ?03/28/2021 9:43 AM ? ?

## 2021-03-29 NOTE — Telephone Encounter (Signed)
Oral Chemotherapy Pharmacist Encounter ? ?Shipment is pending - patient will receive through [employer] assistance, [facility]. Patient will call today (03/29/21) to set up shipment.   ? ?Patient Education ?I spoke with patient for overview of new oral chemotherapy medication: vismodegib (Erivedge) for the treatment of basal cell carcinoma, planned duration until disease progression or unacceptable drug toxicity.  ? ?Pt is doing well. Counseled patient on administration, dosing, side effects, monitoring, drug-food interactions, safe handling, storage, and disposal. ?Patient will take 1 capsule (150 mg) by mouth daily without regard to food. ? ?Side effects include but not limited to: muscle spasms, hair loss, taste changes, nausea, vomiting, or diarrhea.   ? ?Reviewed with patient importance of keeping a medication schedule and plan for any missed doses. ? ?After discussion with patient no patient barriers to medication adherence identified.  ? ?Mr. Etheredge voiced understanding and appreciation. All questions answered. Medication handout provided. ? ?Provided patient with [facility] phone number. Patient knows to call the office with questions or concerns. Oral Chemotherapy [facility] will continue to follow. ? ?Benn Moulder, PharmD ?Pharmacy Resident  ?03/29/2021 ?10:58 AM ? ?

## 2021-04-02 NOTE — Telephone Encounter (Signed)
Oral Chemotherapy Pharmacist Encounter ? ?Spoke to patient and he stated he has set up shipment with [employer]. They will deliver it on the 29th.  ? ?Drema Halon, PharmD ?Hematology/Oncology Clinical Pharmacist ?[facility] ?[phone] ? ?

## 2021-04-13 ENCOUNTER — Other Ambulatory Visit: Payer: Self-pay | Admitting: Cardiovascular Disease

## 2021-04-22 LAB — HM DIABETES FOOT EXAM

## 2021-04-23 ENCOUNTER — Ambulatory Visit (INDEPENDENT_AMBULATORY_CARE_PROVIDER_SITE_OTHER): Payer: Medicare HMO

## 2021-04-23 DIAGNOSIS — I2510 Atherosclerotic heart disease of native coronary artery without angina pectoris: Secondary | ICD-10-CM

## 2021-04-23 DIAGNOSIS — C44711 Basal cell carcinoma of skin of unspecified lower limb, including hip: Secondary | ICD-10-CM

## 2021-04-23 DIAGNOSIS — Z9181 History of falling: Secondary | ICD-10-CM

## 2021-04-23 DIAGNOSIS — I10 Essential (primary) hypertension: Secondary | ICD-10-CM

## 2021-04-23 NOTE — Patient Instructions (Signed)
Visit Information ? ?Thank you for taking time to visit with me today. Please don't hesitate to contact me if I can be of assistance to you before our next scheduled telephone appointment. ? ?Following are the goals we discussed today:  ?Continue to take medications as prescribed   ?Attend all scheduled provider appointments ?Call pharmacy for medication refills 3-7 days in advance of running out of medications ?Call provider office for new concerns or questions  ?- Keep walkways clear of clutter and avoid throw rugs or use non slip rugs ?-  Consider monitoring your blood pressure at least 1 time per week.      ?-  Continue to adhere to a heart healthy diet consisting of fruits, vegetables and whole grains - and low in saturated fat, cholesterol, sodium (salt) and added sugar. ?Follow low salt / DASH diet ?Notify your provider if you have symptoms related to new treatment for basal cell ( nausea, vomiting, fatigue, anorexia, and/ or weight loss) ? ?Our next appointment is by telephone on 06/24/21 at 2:00 pm ? ?Please call the care guide team at [phone] if you need to cancel or reschedule your appointment.  ? ?If you are experiencing a Mental Health or [place] or need someone to talk to, please call the Suicide and Crisis Lifeline: 988 ?call 1-800-273-TALK (toll free, 24 hour hotline)  ? ?Patient verbalizes understanding of instructions and care plan provided today and agrees to view in [place]. Active MyChart status confirmed with patient.   ? ?Quinn Plowman RN,BSN,CCM ?RN Case Manager ?[facility]  ?[phone] ? ?

## 2021-04-23 NOTE — Chronic Care Management (AMB) (Signed)
?Chronic Care Management  ? ?CCM RN Visit Note ? ?04/23/2021 ?Name: Roy [name] MRN: [id number] DOB: 09-26-1945 ? ?Subjective: ?Roy [name] is a 76 y.o. year old male who is a primary care patient of [name], [name], MD. The care management team was consulted for assistance with disease management and care coordination needs.   ? ?Engaged with patient by telephone for follow up visit in response to provider referral for case management and/or care coordination services.  ? ?Consent to Services:  ?The patient was given information about Chronic Care Management services, agreed to services, and gave verbal consent prior to initiation of services.  Please see initial visit note for detailed documentation.  ? ?Patient agreed to services and verbal consent obtained.  ? ?Assessment: Review of patient past medical history, allergies, medications, health status, including review of consultants reports, laboratory and other test data, was performed as part of comprehensive evaluation and provision of chronic care management services.  ? ?SDOH (Social Determinants of Health) assessments and interventions performed:   ? ?CCM Care Plan ? ?No Known Allergies ? ?Outpatient Encounter Medications as of 04/23/2021  ?Medication Sig Note  ? acetaminophen (TYLENOL) 325 MG tablet Take 2 tablets (650 mg total) by mouth every 6 (six) hours as needed.   ? atorvastatin (LIPITOR) 40 MG tablet Take 1 tablet (40 mg total) by mouth daily. 12/20/2020: Patient states he is now taking 80 mg 1 x per day.   ? carvedilol (COREG) 12.5 MG tablet Take 1 tablet (12.5 mg total) by mouth 2 (two) times daily with a meal. Please make yearly appt with Dr. Johnsie Cancel in April 2023 for future refills. Thank you 1st attempt   ? clopidogrel (PLAVIX) 75 MG tablet Take 1 tablet (75 mg total) by mouth daily. Please make overdue appt with Dr. Johnsie Cancel before anymore refills. Thank you 1st attempt   ? co-enzyme Q-10 30 MG capsule Take 30 mg by mouth daily.   ?  docusate sodium (COLACE) 100 MG capsule Take 1 capsule (100 mg total) by mouth 2 (two) times daily.   ? feeding supplement (ENSURE IMMUNE HEALTH) LIQD Take 237 mLs by mouth in the morning.   ? fluocinonide (LIDEX) 0.05 % external solution Apply 1 application topically at bedtime as needed (psoriasis).   ? gabapentin (NEURONTIN) 300 MG capsule Take 1 capsule (300 mg total) by mouth 2 (two) times daily for 5 days.   ? hydrocortisone cream 1 % Apply 1 application topically 2 (two) times daily as needed for itching.   ? nitroGLYCERIN (NITROSTAT) 0.4 MG SL tablet Place 1 tablet (0.4 mg total) under the tongue every 5 (five) minutes x 3 doses as needed for chest pain.   ? Omega-3 Fatty Acids (FISH OIL) 1200 MG CPDR Take 1,200 mg by mouth.   ? omeprazole (PRILOSEC) 20 MG capsule TAKE 1 CAPSULE BY MOUTH EVERY DAY   ? oxyCODONE (OXY IR/ROXICODONE) 5 MG immediate release tablet Take 1 tablet (5 mg total) by mouth every 6 (six) hours as needed for moderate pain.   ? triamcinolone (KENALOG) 0.1 % Apply 1 application topically 3 (three) times daily as needed (psoriasis).   ? venlafaxine XR (EFFEXOR-XR) 37.5 MG 24 hr capsule TAKE ONE CAPSULE DAILY IN ADDITION TO 75 MG TABLETS   ? venlafaxine XR (EFFEXOR-XR) 75 MG 24 hr capsule TAKE 1 CAPSULE BY MOUTH DAILY WITH BREAKFAST.   ? vismodegib (ERIVEDGE) 150 MG capsule Take 1 capsule (150 mg total) by mouth daily.   ? ?No  facility-administered encounter medications on file as of 04/23/2021.  ? ? ?Patient Active Problem List  ? Diagnosis Date Noted  ? Basal cell carcinoma (BCC) of skin of ankle 02/12/2021  ? Vertigo 11/09/2020  ? S/P hernia surgery 09/24/2020  ? Spinal stenosis 05/08/2020  ? Imbalance 05/08/2020  ? History of colostomy reversal 03/07/2020  ? Controlled type 2 diabetes mellitus with circulatory disorder ([place]) 07/23/2019  ? Insomnia 07/23/2019  ? Personal history of multiple sclerosis (Roy [name]) 12/02/2018  ? Weakness 03/31/2017  ? Internal hemorrhoids with complication  94/17/4081  ? CAD, RCA PCI 2001, urgent Dx2 DES 04/24/14 04/24/2014  ? Unstable angina with ST elelvation and NSVT while on treadmill 04/24/14   ? History of basal cell carcinoma 03/07/2014  ? GERD (gastroesophageal reflux disease) 03/07/2014  ? Incomplete emptying of bladder 03/07/2014  ? Obstructive sleep apnea 08/20/2009  ? Dyslipidemia 11/09/2008  ? Depression, major, recurrent (Roy [name]) 07/06/2007  ? Essential hypertension 07/06/2007  ? Coronary artery disease involving native coronary artery of native heart without angina pectoris 07/06/2007  ? ? ?Conditions to be addressed/monitored:CAD, HTN, and Falls, basal cell cancer ? ?Care Plan : RNCM plan of care  ?Updates made by Dannielle Karvonen, RN since 04/23/2021 12:00 AM  ?  ? ?Problem: chronic disease management education and/ or care coordination   ?Priority: High  ?  ? ?Long-Range Goal: Development of plan of care to address chronic disease management and/ or care coordination needs.   ?Start Date: 12/20/2020  ?Expected End Date: 07/12/2021  ?Priority: High  ?Note:   ?Current Barriers:  ?Knowledge Deficits related to plan of care for management of CAD, HTN, and falls  ?Chronic Disease Management support and education needs related to CAD, HTN, and falls ?Patient reports his balance is better but not 100%.  He denies any falls.  Patient states he is taking oral medication treatment for basal cell cancer lesions.  Patient denies any ill effect from medication at this time other than fatigue.  Patient states he has received his new hearing aids. Confirmed with patient his annual wellness visit is 05/10/2021.  ?RNCM Clinical Goal(s):  ?Patient will verbalize basic understanding of CAD, HTN, and falls disease process and self health management plan as evidenced by patient report and/ or notation in chart ?take all medications exactly as prescribed and will call provider for medication related questions as evidenced by patient report and/ or notation in chart    ?attend all  scheduled medical appointments:   as evidenced by patient report and/ or notation in chart        ?continue to work with Consulting civil engineer and/or Social Worker to address care management and care coordination needs related to CAD, HTN, and falls as evidenced by adherence to CM Team Scheduled appointments     through collaboration with Consulting civil engineer, provider, and care team.  ? ?Interventions: ?1:1 collaboration with primary care provider regarding development and update of comprehensive plan of care as evidenced by provider attestation and co-signature ?Inter-disciplinary care team collaboration (see longitudinal plan of care) ?Evaluation of current treatment plan related to  self management and patient's adherence to plan as established by provider ? ? ?CAD Interventions: (Status:  Goal on track:  Yes.) Long Term Goal ?Medications reviewed and discussed importance of compliance.  ?Provided education on Importance of limiting foods high in cholesterol ?Reviewed Importance of taking all medications as prescribed ?Reviewed Importance of attending all scheduled provider appointments ?Encouraged patient to follow heart healthy diet and low  salt diet.  ? ? ?Falls Interventions:  (Status:  Goal on track:  Yes.) Long Term Goal ?Advised patient of importance of notifying provider of falls ?Assessed for falls since last encounter ?Reviewed fall precautions.  ? ?Skin (basal cell) cancer:  ( status: New goal):  Long term ?Reviewed scheduled / upcoming provider appointments ?Discussed plans with patient for ongoing care management follow up and provided patient with direct contact information for care management team ?Notify provider for symptoms related to new treatment ( nausea, vomiting, fatigue, anorexia, or weigh loss) ? ? ?Hypertension Interventions:  (Status:  Goal on track:  Yes.) Long Term Goal ?Last practice recorded BP readings:  ?BP Readings from Last 3 Encounters:  ?03/21/21 134/82  ?02/12/21 115/87  ?11/09/20  136/88  ?Most recent eGFR/CrCl: No results found for: EGFR  No components found for: CRCL ? ?Evaluation of current treatment plan related to hypertension self management and patient's adherence to plan as established b

## 2021-05-02 ENCOUNTER — Ambulatory Visit (INDEPENDENT_AMBULATORY_CARE_PROVIDER_SITE_OTHER): Payer: Medicare HMO

## 2021-05-02 ENCOUNTER — Other Ambulatory Visit: Payer: Self-pay

## 2021-05-02 VITALS — Ht 66.0 in | Wt 200.0 lb

## 2021-05-02 DIAGNOSIS — Z0000 Encounter for general adult medical examination without abnormal findings: Secondary | ICD-10-CM | POA: Diagnosis not present

## 2021-05-02 NOTE — Progress Notes (Signed)
?Virtual Visit via Telephone Note ? ?I connected with  Roy [name] on 05/02/21 at  2:15 PM EDT by telephone and verified that I am speaking with the correct person using two identifiers. ? ?Location: ?Patient: home ?Provider: [place] ?Persons participating in the virtual visit: patient/Nurse Health Advisor ?  ?I discussed the limitations, risks, security and privacy concerns of performing an evaluation and management service by telephone and the availability of in person appointments. The patient expressed understanding and agreed to proceed. ? ?Interactive audio and video telecommunications were attempted between this nurse and patient, however failed, due to patient having technical difficulties OR patient did not have access to video capability.  We continued and completed visit with audio only. ? ?Some vital signs may be absent or patient reported.  ? ?Roy David, LPN ? ?Subjective:  ? Roy [name] is a 76 y.o. male who presents for Medicare Annual/Subsequent preventive examination. ? ?Review of Systems    ? ?  ? ?   ?Objective:  ?  ?There were no vitals filed for this visit. ?There is no height or weight on file to calculate BMI. ? ? ?  03/21/2021  ?  2:01 PM 02/12/2021  ?  7:38 AM 09/24/2020  ?  5:00 PM 09/18/2020  ? 12:56 PM 05/01/2020  ?  2:05 PM 03/07/2020  ?  6:54 AM 02/29/2020  ?  2:07 PM  ?Advanced Directives  ?Does Patient Have a Medical Advance Directive? _0  Yes Yes  ?Type of Paramedic of [place];Living will [place];Living will [place];Living will [place];Living will [place];Living will [place];Living will [employer];Living will  ?Does patient want to make changes to medical advance directive?  No - Patient declined No - Patient declined   No - Patient declined   ?Copy of [place] in Chart? No - copy  requested No - copy requested   No - copy requested No - copy requested   ? ? ?Current Medications (verified) ?Outpatient Encounter Medications as of 05/02/2021  ?Medication Sig  ? acetaminophen (TYLENOL) 325 MG tablet Take 2 tablets (650 mg total) by mouth every 6 (six) hours as needed.  ? atorvastatin (LIPITOR) 40 MG tablet Take 1 tablet (40 mg total) by mouth daily.  ? carvedilol (COREG) 12.5 MG tablet Take 1 tablet (12.5 mg total) by mouth 2 (two) times daily with a meal. Please make yearly appt with Dr. Johnsie Cancel in April 2023 for future refills. Thank you 1st attempt  ? clopidogrel (PLAVIX) 75 MG tablet Take 1 tablet (75 mg total) by mouth daily. Please make overdue appt with Dr. Johnsie Cancel before anymore refills. Thank you 1st attempt  ? co-enzyme Q-10 30 MG capsule Take 30 mg by mouth daily.  ? docusate sodium (COLACE) 100 MG capsule Take 1 capsule (100 mg total) by mouth 2 (two) times daily.  ? feeding supplement (ENSURE IMMUNE HEALTH) LIQD Take 237 mLs by mouth in the morning.  ? fluocinonide (LIDEX) 0.05 % external solution Apply 1 application topically at bedtime as needed (psoriasis).  ? gabapentin (NEURONTIN) 300 MG capsule Take 1 capsule (300 mg total) by mouth 2 (two) times daily for 5 days.  ? hydrocortisone cream 1 % Apply 1 application topically 2 (two) times daily as needed for itching.  ? MODERNA COVID-19 BIVAL BOOSTER 50 MCG/0.5ML injection   ? nitroGLYCERIN (NITROSTAT) 0.4 MG  SL tablet Place 1 tablet (0.4 mg total) under the tongue every 5 (five) minutes x 3 doses as needed for chest pain.  ? Omega-3 Fatty Acids (FISH OIL) 1200 MG CPDR Take 1,200 mg by mouth.  ? omeprazole (PRILOSEC) 20 MG capsule TAKE 1 CAPSULE BY MOUTH EVERY DAY  ? oxyCODONE (OXY IR/ROXICODONE) 5 MG immediate release tablet Take 1 tablet (5 mg total) by mouth every 6 (six) hours as needed for moderate pain.  ? triamcinolone (KENALOG) 0.1 % Apply 1 application topically 3 (three) times daily as needed (psoriasis).  ? venlafaxine XR  (EFFEXOR-XR) 37.5 MG 24 hr capsule TAKE ONE CAPSULE DAILY IN ADDITION TO 75 MG TABLETS  ? venlafaxine XR (EFFEXOR-XR) 75 MG 24 hr capsule TAKE 1 CAPSULE BY MOUTH DAILY WITH BREAKFAST.  ? vismodegib (ERIVEDGE) 150 MG capsule Take 1 capsule (150 mg total) by mouth daily.  ? ?No facility-administered encounter medications on file as of 05/02/2021.  ? ? ?Allergies (verified) ?Patient has no known allergies.  ? ?History: ?Past Medical History:  ?Diagnosis Date  ? Basal cell carcinoma   ? Bilateral Legs  ? CAD (coronary artery disease)   ? a.  stent to the RCA in 2001;  b.  Negative Myoview in January 2012;  c.  LHC 4/16:  dLM 20, small D1 50-70, D2 sub-total, oLCx 50-70, RCA stent ok, dPLB 90, EF 35-40% >> DES to D2  ? History of echocardiogram   ? a.  Echo 4/16: Mild LVH, EF 47-82%, grade 1 diastolic dysfunction, normal wall motion, MAC  ? HLD (hyperlipidemia)   ? HTN (hypertension)   ? MS (multiple sclerosis) ([place])   ? OSA (obstructive sleep apnea)   ?  no cpap   ? Pre-diabetes   ? Vertigo   ? ?Past Surgical History:  ?Procedure Laterality Date  ? BIOPSY  07/26/2019  ? Procedure: BIOPSY;  Surgeon: Gatha Mayer, MD;  Location: [place];  Service: Endoscopy;;  ? CATARACT EXTRACTION, BILATERAL    ? COLON RESECTION SIGMOID N/A 08/30/2019  ? Procedure: SIGMOID COLON RESECTION;  Surgeon: Clovis Riley, MD;  Location: [place];  Service: General;  Laterality: N/A;  ? COLONOSCOPY N/A 07/26/2019  ? Procedure: COLONOSCOPY;  Surgeon: Gatha Mayer, MD;  [facility] ENDOSCOPY;  Service: Endoscopy;  Laterality: N/A;  ? COLONOSCOPY    ? COLOSTOMY N/A 08/30/2019  ? Procedure: COLOSTOMY;  Surgeon: Clovis Riley, MD;  Location: [place];  Service: General;  Laterality: N/A;  ? COLOSTOMY TAKEDOWN N/A 03/07/2020  ? Procedure: LAPAROSCOPIC COLOSTOMY REVERSAL, RIGID PROCTOSCOPY;  Surgeon: Clovis Riley, MD;  Location: [facility];  Service: General;  Laterality: N/A;  ? CORONARY STENT PLACEMENT  2001  ?  RCA  ? INCISIONAL HERNIA  REPAIR N/A 09/24/2020  ? Procedure: LAPAROSCOPIC INCISIONAL HERNIA REPAIR WITH MESH;  Surgeon: Clovis Riley, MD;  Location: [facility];  Service: General;  Laterality: N/A;  ? LEFT HEART CATHETERIZATION WITH CORONARY ANGIOGRAM N/A 02/13/2012  ? Procedure: LEFT HEART CATHETERIZATION WITH CORONARY ANGIOGRAM;  Surgeon: [name], MD;  Location[facility] CATH LAB;  Service: Cardiovascular;  Laterality: N/A;  ? LEFT HEART CATHETERIZATION WITH CORONARY ANGIOGRAM N/A 04/24/2014  ? Procedure: LEFT HEART CATHETERIZATION WITH CORONARY ANGIOGRAM;  Surgeon: [name], MD;  Location[facility]ATH LAB;  Service: Cardiovascular;  Laterality: N/A;  ? LYSIS OF ADHESION N/A 03/07/2020  ? Procedure: LYSIS OF ADHESION;  Surgeon: Clovis Riley, MD;  Location: [facility];  Service: General;  Laterality:  N/A;  ? TONSILLECTOMY    ? ?Family History  ?Problem Relation Age of Onset  ? Cirrhosis Mother   ? Alcohol abuse Mother   ? Pancreatic cancer Mother   ? Heart disease Father   ? Hypertension Father   ? Peripheral vascular disease Father   ? Coronary artery disease Father   ? Depression Sister   ? Heart attack Neg Hx   ? Stroke Neg Hx   ? Stomach cancer Neg Hx   ? Colon cancer Neg Hx   ? Esophageal cancer Neg Hx   ? ?Social History  ? ?Socioeconomic History  ? Marital status: Divorced  ?  Spouse name: Not on file  ? Number of children: Not on file  ? Years of education: Not on file  ? Highest education level: Not on file  ?Occupational History  ? Not on file  ?Tobacco Use  ? Smoking status: Former  ?  Packs/day: 1.00  ?  Years: 25.00  ?  Pack years: 25.00  ?  Types: Cigarettes  ?  Quit date: 01/14/2000  ?  Years since quitting: 21.3  ? Smokeless tobacco: Never  ?Vaping Use  ? Vaping Use: Never used  ?Substance and Sexual Activity  ? Alcohol use: Yes  ?  Alcohol/week: 3.0 standard drinks  ?  Types: 1 Cans of beer, 2 Shots of liquor per week  ?  Comment: 4-5 drinks per week   ? Drug use: No  ? Sexual activity: Not Currently  ?Other Topics  Concern  ? Not on file  ?Social History Narrative  ? 2 kids, 4 grandkids, separated  ? Salesman.  ? 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  ? Right handed  ? Drinks caffeine  ? Single story home

## 2021-05-02 NOTE — Patient Instructions (Addendum)
Mr. Hippler , ?Thank you for taking time to come for your Medicare Wellness Visit. I appreciate your ongoing commitment to your health goals. Please review the following plan we discussed and let me know if I can assist you in the future.  ? ?Screening recommendations/referrals: ?Colonoscopy: 07/26/19 ?Recommended yearly ophthalmology/optometry visit for glaucoma screening and checkup ?Recommended yearly dental visit for hygiene and checkup ? ?Vaccinations: ?Influenza vaccine: 09/20/20 ?Pneumococcal vaccine: 08/09/18 ?Tdap vaccine: n/c ?Shingles vaccine: Shingrix 06/14/19, 09/29/19   ?Covid-19: 03/08/19, 03/29/19, 12/05/19, 01/17/21 ? ?Advanced directives: no ? ?Conditions/risks identified: none ? ?Next appointment: Follow up in one year for your annual wellness visit. -  05/05/22 @ 9:45am by phone ? ?Preventive Care 75 Years and Older, Male ?Preventive care refers to lifestyle choices and visits with your health care provider that can promote health and wellness. ?What does preventive care include? ?A yearly physical exam. This is also called an annual well check. ?Dental exams once or twice a year. ?Routine eye exams. Ask your health care provider how often you should have your eyes checked. ?Personal lifestyle choices, including: ?Daily care of your teeth and gums. ?Regular physical activity. ?Eating a healthy diet. ?Avoiding tobacco and drug use. ?Limiting alcohol use. ?Practicing safe sex. ?Taking low doses of aspirin every day. ?Taking vitamin and mineral supplements as recommended by your health care provider. ?What happens during an annual well check? ?The services and screenings done by your health care provider during your annual well check will depend on your age, overall health, lifestyle risk factors, and family history of disease. ?Counseling  ?Your health care provider may ask you questions about your: ?Alcohol use. ?Tobacco use. ?Drug use. ?Emotional well-being. ?Home and relationship well-being. ?Sexual  activity. ?Eating habits. ?History of falls. ?Memory and ability to understand (cognition). ?Work and work Statistician. ?Screening  ?You may have the following tests or measurements: ?Height, weight, and BMI. ?Blood pressure. ?Lipid and cholesterol levels. These may be checked every 5 years, or more frequently if you are over 75 years old. ?Skin check. ?Lung cancer screening. You may have this screening every year starting at age 15 if you have a 30-pack-year history of smoking and currently smoke or have quit within the past 15 years. ?Fecal occult blood test (FOBT) of the stool. You may have this test every year starting at age 14. ?Flexible sigmoidoscopy or colonoscopy. You may have a sigmoidoscopy every 5 years or a colonoscopy every 10 years starting at age 54. ?Prostate cancer screening. Recommendations will vary depending on your family history and other risks. ?Hepatitis C blood test. ?Hepatitis B blood test. ?Sexually transmitted disease (STD) testing. ?Diabetes screening. This is done by checking your blood sugar (glucose) after you have not eaten for a while (fasting). You may have this done every 1-3 years. ?Abdominal aortic aneurysm (AAA) screening. You may need this if you are a current or former smoker. ?Osteoporosis. You may be screened starting at age 54 if you are at high risk. ?Talk with your health care provider about your test results, treatment options, and if necessary, the need for more tests. ?Vaccines  ?Your health care provider may recommend certain vaccines, such as: ?Influenza vaccine. This is recommended every year. ?Tetanus, diphtheria, and acellular pertussis (Tdap, Td) vaccine. You may need a Td booster every 10 years. ?Zoster vaccine. You may need this after age 58. ?Pneumococcal 13-valent conjugate (PCV13) vaccine. One dose is recommended after age 28. ?Pneumococcal polysaccharide (PPSV23) vaccine. One dose is recommended after age 80. ?Talk  to your health care provider about which  screenings and vaccines you need and how often you need them. ?This information is not intended to replace advice given to you by your health care provider. Make sure you discuss any questions you have with your health care provider. ?Document Released: 01/26/2015 Document Revised: 09/19/2015 Document Reviewed: 10/31/2014 ?Elsevier Interactive Patient Education ? 2017 [employer]. ? ?Fall Prevention in the Home ?Falls can cause injuries. They can happen to people of all ages. There are many things you can do to make your home safe and to help prevent falls. ?What can I do on the outside of my home? ?Regularly fix the edges of walkways and driveways and fix any cracks. ?Remove anything that might make you trip as you walk through a door, such as a raised step or threshold. ?Trim any bushes or trees on the path to your home. ?Use bright outdoor lighting. ?Clear any walking paths of anything that might make someone trip, such as rocks or tools. ?Regularly check to see if handrails are loose or broken. Make sure that both sides of any steps have handrails. ?Any raised decks and porches should have guardrails on the edges. ?Have any leaves, snow, or ice cleared regularly. ?Use sand or salt on walking paths during winter. ?Clean up any spills in your garage right away. This includes oil or grease spills. ?What can I do in the bathroom? ?Use night lights. ?Install grab bars by the toilet and in the tub and shower. Do not use towel bars as grab bars. ?Use non-skid mats or decals in the tub or shower. ?If you need to sit down in the shower, use a plastic, non-slip stool. ?Keep the floor dry. Clean up any water that spills on the floor as soon as it happens. ?Remove soap buildup in the tub or shower regularly. ?Attach bath mats securely with double-sided non-slip rug tape. ?Do not have throw rugs and other things on the floor that can make you trip. ?What can I do in the bedroom? ?Use night lights. ?Make sure that you have a  light by your bed that is easy to reach. ?Do not use any sheets or blankets that are too big for your bed. They should not hang down onto the floor. ?Have a firm chair that has side arms. You can use this for support while you get dressed. ?Do not have throw rugs and other things on the floor that can make you trip. ?What can I do in the kitchen? ?Clean up any spills right away. ?Avoid walking on wet floors. ?Keep items that you use a lot in easy-to-reach places. ?If you need to reach something above you, use a strong step stool that has a grab bar. ?Keep electrical cords out of the way. ?Do not use floor polish or wax that makes floors slippery. If you must use wax, use non-skid floor wax. ?Do not have throw rugs and other things on the floor that can make you trip. ?What can I do with my stairs? ?Do not leave any items on the stairs. ?Make sure that there are handrails on both sides of the stairs and use them. Fix handrails that are broken or loose. Make sure that handrails are as long as the stairways. ?Check any carpeting to make sure that it is firmly attached to the stairs. Fix any carpet that is loose or worn. ?Avoid having throw rugs at the top or bottom of the stairs. If you do have throw rugs,  attach them to the floor with carpet tape. ?Make sure that you have a light switch at the top of the stairs and the bottom of the stairs. If you do not have them, ask someone to add them for you. ?What else can I do to help prevent falls? ?Wear shoes that: ?Do not have high heels. ?Have rubber bottoms. ?Are comfortable and fit you well. ?Are closed at the toe. Do not wear sandals. ?If you use a stepladder: ?Make sure that it is fully opened. Do not climb a closed stepladder. ?Make sure that both sides of the stepladder are locked into place. ?Ask someone to hold it for you, if possible. ?Clearly mark and make sure that you can see: ?Any grab bars or handrails. ?First and last steps. ?Where the edge of each step  is. ?Use tools that help you move around (mobility aids) if they are needed. These include: ?Canes. ?Walkers. ?Scooters. ?Crutches. ?Turn on the lights when you go into a dark area. Replace any light bulbs as

## 2021-05-08 ENCOUNTER — Other Ambulatory Visit: Payer: Self-pay | Admitting: Cardiovascular Disease

## 2021-05-10 ENCOUNTER — Ambulatory Visit (INDEPENDENT_AMBULATORY_CARE_PROVIDER_SITE_OTHER): Payer: Medicare HMO | Admitting: Family Medicine

## 2021-05-10 ENCOUNTER — Encounter: Payer: Self-pay | Admitting: Family Medicine

## 2021-05-10 VITALS — BP 100/70 | HR 90 | Temp 98.2°F | Ht 65.5 in | Wt 197.5 lb

## 2021-05-10 DIAGNOSIS — E1159 Type 2 diabetes mellitus with other circulatory complications: Secondary | ICD-10-CM | POA: Diagnosis not present

## 2021-05-10 DIAGNOSIS — I2510 Atherosclerotic heart disease of native coronary artery without angina pectoris: Secondary | ICD-10-CM | POA: Diagnosis not present

## 2021-05-10 DIAGNOSIS — I10 Essential (primary) hypertension: Secondary | ICD-10-CM | POA: Diagnosis not present

## 2021-05-10 DIAGNOSIS — G4700 Insomnia, unspecified: Secondary | ICD-10-CM | POA: Diagnosis not present

## 2021-05-10 DIAGNOSIS — Z0000 Encounter for general adult medical examination without abnormal findings: Secondary | ICD-10-CM

## 2021-05-10 DIAGNOSIS — G35 Multiple sclerosis: Secondary | ICD-10-CM

## 2021-05-10 DIAGNOSIS — R69 Illness, unspecified: Secondary | ICD-10-CM | POA: Diagnosis not present

## 2021-05-10 DIAGNOSIS — F331 Major depressive disorder, recurrent, moderate: Secondary | ICD-10-CM | POA: Diagnosis not present

## 2021-05-10 MED ORDER — NITROGLYCERIN 0.4 MG SL SUBL: 0.4000 mg | SUBLINGUAL_TABLET | SUBLINGUAL | 0 refills | Status: DC | PRN

## 2021-05-10 MED ORDER — TRAZODONE HCL 50 MG PO TABS: 25.0000 mg | ORAL_TABLET | Freq: Every evening | ORAL | 3 refills | Status: DC | PRN

## 2021-05-10 NOTE — Assessment & Plan Note (Signed)
Per patient asymptomatic at this time and not interested in referral or treatment. ?

## 2021-05-10 NOTE — Assessment & Plan Note (Signed)
Followed by cardiology 

## 2021-05-10 NOTE — Patient Instructions (Addendum)
Please stop at the lab to have labs drawn. ? Start trazadone for sleep at night. ? Can make a follow up mood appt in 4 weeks if needed. ?Work on [employer], low carb diet and regular exercise. ? ?

## 2021-05-10 NOTE — Assessment & Plan Note (Addendum)
Chronic,  Inadequate control. ? Not tinterested in seeing a counselor. ? ?Continue venlafaxine &5 mg...112.5 mg daily ( 37.5 and 75 mg ) was not effective. Daily ? will treat sleep first. ?

## 2021-05-10 NOTE — Assessment & Plan Note (Signed)
Due for reevaluation. ?Diet controlled ?

## 2021-05-10 NOTE — Assessment & Plan Note (Signed)
Start trazodone for sleep. ? Reassess  Mood in about 4 weeks. ?

## 2021-05-10 NOTE — Assessment & Plan Note (Signed)
Stable, chronic.  Continue current medication. ? ? ? coreg 12.5 mg BID ?

## 2021-05-10 NOTE — Progress Notes (Signed)
? ? Patient ID: Roy [name], male    DOB: 09-30-1945, 76 y.o.   MRN: [id number] ? ?This visit was conducted in person. ? ?BP 100/70   Pulse 90   Temp 98.2 ?F (36.8 ?C) (Oral)   Ht 5' 5.5" (1.664 m)   Wt 197 lb 8 oz (89.6 kg)   SpO2 94%   BMI 32.37 kg/m?   ? ?CC:  ?Chief Complaint  ?Patient presents with  ? Annual Exam  ?  Part 2  ? ? ?Subjective:  ? ?HPI: ?Roy [name] is a 76 y.o. male presenting on 05/10/2021 for Annual Exam (Part 2) ? ?The patient presents for  complete physical and review of chronic health problems. He/She also has the following acute concerns today: none ? ?The patient saw a LPN or RN for medicare wellness visit. ? Prevention and wellness was reviewed in detail. ?Note reviewed and important notes copied below. ? ? He is being treated with oral chemo for basal cell carcinoma. Started 03/2021.  Having some cramps and fatigue. ? ?Wt Readings from Last 3 Encounters:  ?05/10/21 197 lb 8 oz (89.6 kg)  ?05/02/21 200 lb (90.7 kg)  ?03/21/21 200 lb 2 oz (90.8 kg)  ? ?Hypertension:   Well-controlled on Coreg 12.5 mg twice daily,  ?BP Readings from Last 3 Encounters:  ?05/10/21 100/70  ?03/21/21 134/82  ?02/12/21 115/87  ?Using medication without problems or lightheadedness:  none ?Chest pain with exertion:none ?Edema: occ ?Short of breath: none ?Average home BPs: ?Other issues: ? ?Elevated Cholesterol: LDL goal < 70 given CAD history.  On atorvastatin 80 mg daily ?Due for re-eval. ?Using medications without problems: ?Muscle aches:  ?Diet compliance: moderate ?Exercise: walking  occasional ?Other complaints: ? ?Diabetes: previously well controlled ?Due for re-eval  diet controlled ?Using medications without difficulties: ?Hypoglycemic episodes: ?Hyperglycemic episodes: ?Feet problems: no ucler ?Blood Sugars averaging: not checking ?eye exam within last year: scheduled ? ?MDD: He reports decreased motivation and worsening mood more in last few months. ? He states that the higher dose of  venlafaxine 112.5 mg daily did not help more than the 75 mg.. so he has gone back to this. ? PHQ9: 11 ? GAd7: 0 ? ? ?Relevant past medical, surgical, family and social history reviewed and updated as indicated. Interim medical history since our last visit reviewed. ?Allergies and medications reviewed and updated. ?Outpatient Medications Prior to Visit  ?Medication Sig Dispense Refill  ? acetaminophen (TYLENOL) 325 MG tablet Take 2 tablets (650 mg total) by mouth every 6 (six) hours as needed. 30 tablet 0  ? atorvastatin (LIPITOR) 80 MG tablet Take 80 mg by mouth daily.    ? carvedilol (COREG) 12.5 MG tablet Take 1 tablet (12.5 mg total) by mouth 2 (two) times daily with a meal. Please make yearly appt with Dr. Johnsie Cancel in April 2023 for future refills. Thank you 1st attempt 180 tablet 0  ? clopidogrel (PLAVIX) 75 MG tablet Take 1 tablet (75 mg total) by mouth daily. Please make overdue appt with Dr. Johnsie Cancel before anymore refills. 2nd attempt 15 tablet 0  ? co-enzyme Q-10 30 MG capsule Take 30 mg by mouth daily.    ? feeding supplement (ENSURE IMMUNE HEALTH) LIQD Take 237 mLs by mouth in the morning.    ? fluocinonide (LIDEX) 0.05 % external solution Apply 1 application topically at bedtime as needed (psoriasis).    ? hydrocortisone cream 1 % Apply 1 application topically 2 (two) times daily as needed for itching.    ?  nitroGLYCERIN (NITROSTAT) 0.4 MG SL tablet Place 1 tablet (0.4 mg total) under the tongue every 5 (five) minutes x 3 doses as needed for chest pain. 30 tablet 0  ? Omega-3 Fatty Acids (FISH OIL) 1200 MG CPDR Take 1,200 mg by mouth.    ? omeprazole (PRILOSEC) 20 MG capsule TAKE 1 CAPSULE BY MOUTH EVERY DAY 90 capsule 1  ? triamcinolone (KENALOG) 0.1 % Apply 1 application topically 3 (three) times daily as needed (psoriasis).    ? venlafaxine XR (EFFEXOR-XR) 75 MG 24 hr capsule TAKE 1 CAPSULE BY MOUTH DAILY WITH BREAKFAST. 90 capsule 1  ? vismodegib (ERIVEDGE) 150 MG capsule Take 1 capsule (150 mg total)  by mouth daily. 60 capsule 1  ? atorvastatin (LIPITOR) 40 MG tablet Take 1 tablet (40 mg total) by mouth daily. 90 tablet 3  ? docusate sodium (COLACE) 100 MG capsule Take 1 capsule (100 mg total) by mouth 2 (two) times daily. (Patient not taking: Reported on 05/02/2021) 10 capsule 0  ? gabapentin (NEURONTIN) 300 MG capsule Take 1 capsule (300 mg total) by mouth 2 (two) times daily for 5 days. 10 capsule 0  ? MODERNA COVID-19 BIVAL BOOSTER 50 MCG/0.5ML injection     ? oxyCODONE (OXY IR/ROXICODONE) 5 MG immediate release tablet Take 1 tablet (5 mg total) by mouth every 6 (six) hours as needed for moderate pain. (Patient not taking: Reported on 05/02/2021) 30 tablet 0  ? venlafaxine XR (EFFEXOR-XR) 37.5 MG 24 hr capsule TAKE ONE CAPSULE DAILY IN ADDITION TO 75 MG TABLETS 90 capsule 1  ? ?No facility-administered medications prior to visit.  ?  ? ?Per HPI unless specifically indicated in ROS section below ?Review of Systems  ?Constitutional:  Negative for fatigue and fever.  ?HENT:  Negative for ear pain.   ?Eyes:  Negative for pain.  ?Respiratory:  Negative for cough and shortness of breath.   ?Cardiovascular:  Negative for chest pain, palpitations and leg swelling.  ?Gastrointestinal:  Negative for abdominal pain.  ?Genitourinary:  Negative for dysuria.  ?Musculoskeletal:  Negative for arthralgias.  ?Neurological:  Negative for syncope, light-headedness and headaches.  ?Psychiatric/Behavioral:  Positive for dysphoric mood and sleep disturbance. Negative for suicidal ideas.   ?Objective:  ?BP 100/70   Pulse 90   Temp 98.2 ?F (36.8 ?C) (Oral)   Ht 5' 5.5" (1.664 m)   Wt 197 lb 8 oz (89.6 kg)   SpO2 94%   BMI 32.37 kg/m?   ?Wt Readings from Last 3 Encounters:  ?05/10/21 197 lb 8 oz (89.6 kg)  ?05/02/21 200 lb (90.7 kg)  ?03/21/21 200 lb 2 oz (90.8 kg)  ?  ?  ?Physical Exam ?Constitutional:   ?   General: He is not in acute distress. ?   Appearance: Normal appearance. He is well-developed. He is not ill-appearing or  toxic-appearing.  ?HENT:  ?   Head: Normocephalic and atraumatic.  ?   Right Ear: Hearing, tympanic membrane, ear canal and external ear normal.  ?   Left Ear: Hearing, tympanic membrane, ear canal and external ear normal.  ?   Nose: Nose normal.  ?   Mouth/Throat:  ?   Pharynx: Uvula midline.  ?Eyes:  ?   General: Lids are normal. Lids are everted, no foreign bodies appreciated.  ?   Conjunctiva/sclera: Conjunctivae normal.  ?   Pupils: Pupils are equal, round, and reactive to light.  ?Neck:  ?   Thyroid: No thyroid mass or thyromegaly.  ?   Vascular:  No carotid bruit.  ?   Trachea: Trachea and phonation normal.  ?Cardiovascular:  ?   Rate and Rhythm: Normal rate and regular rhythm.  ?   Pulses: Normal pulses.  ?   Heart sounds: S1 normal and S2 normal. No murmur heard. ?  No gallop.  ?Pulmonary:  ?   Breath sounds: Normal breath sounds. No wheezing, rhonchi or rales.  ?Abdominal:  ?   General: Bowel sounds are normal.  ?   Palpations: Abdomen is soft.  ?   Tenderness: There is no abdominal tenderness. There is no guarding or rebound.  ?   Hernia: No hernia is present.  ?Musculoskeletal:  ?   Cervical back: Normal range of motion and neck supple.  ?Lymphadenopathy:  ?   Cervical: No cervical adenopathy.  ?Skin: ?   General: Skin is warm and dry.  ?   Findings: No rash.  ?Neurological:  ?   Mental Status: He is alert.  ?   Cranial Nerves: No cranial nerve deficit.  ?   Sensory: No sensory deficit.  ?   Gait: Gait normal.  ?   Deep Tendon Reflexes: Reflexes are normal and symmetric.  ?Psychiatric:     ?   Speech: Speech normal.     ?   Behavior: Behavior normal.     ?   Judgment: Judgment normal.  ? ?Diabetic foot exam: ?Normal inspection ?No skin breakdown ?No calluses  ?Normal DP pulses ?Normal sensation to light touch and monofilament ?Nails normal ? ?   ?Results for orders placed or performed in visit on 11/09/20  ?Lipid panel  ?Result Value Ref Range  ? Cholesterol 212 (H) <200 mg/dL  ? HDL 62 > OR = 40 mg/dL  ?  Triglycerides 569 (H) <150 mg/dL  ? LDL Cholesterol (Calc)  mg/dL (calc)  ? Total CHOL/HDL Ratio 3.4 <5.0 (calc)  ? Non-HDL Cholesterol (Calc) 150 (H) <130 mg/dL (calc)  ? ? ?This visit occurred during the SARS-CoV

## 2021-05-11 LAB — HEMOGLOBIN A1C
Hgb A1c MFr Bld: 6.5 % of total Hgb — ABNORMAL HIGH (ref <5.7)
Mean Plasma Glucose: 140 mg/dL
eAG (mmol/L): 7.7 mmol/L

## 2021-05-11 LAB — COMPREHENSIVE METABOLIC PANEL
AG Ratio: 1.7 (calc) (ref 1.0–2.5)
ALT: 38 U/L (ref 9–46)
AST: 27 U/L (ref 10–35)
Albumin: 4.1 g/dL (ref 3.6–5.1)
Alkaline phosphatase (APISO): 49 U/L (ref 35–144)
BUN: 14 mg/dL (ref 7–25)
CO2: 27 mmol/L (ref 20–32)
Calcium: 9.1 mg/dL (ref 8.6–10.3)
Chloride: 104 mmol/L (ref 98–110)
Creat: 1.11 mg/dL (ref 0.70–1.28)
Globulin: 2.4 g/dL (calc) (ref 1.9–3.7)
Glucose, Bld: 151 mg/dL — ABNORMAL HIGH (ref 65–99)
Potassium: 4.6 mmol/L (ref 3.5–5.3)
Sodium: 140 mmol/L (ref 135–146)
Total Bilirubin: 0.5 mg/dL (ref 0.2–1.2)
Total Protein: 6.5 g/dL (ref 6.1–8.1)

## 2021-05-11 LAB — LIPID PANEL
Cholesterol: 226 mg/dL — ABNORMAL HIGH (ref <200)
HDL: 60 mg/dL (ref >=40)
Non-HDL Cholesterol (Calc): 166 mg/dL (calc) — ABNORMAL HIGH (ref <130)
Total CHOL/HDL Ratio: 3.8 (calc) (ref <5.0)
Triglycerides: 755 mg/dL — ABNORMAL HIGH (ref <150)

## 2021-05-11 LAB — MICROALBUMIN / CREATININE URINE RATIO
Creatinine, Urine: 212 mg/dL (ref 20–320)
Microalb Creat Ratio: 21 mcg/mg creat (ref <30)
Microalb, Ur: 4.4 mg/dL

## 2021-05-12 DIAGNOSIS — I2510 Atherosclerotic heart disease of native coronary artery without angina pectoris: Secondary | ICD-10-CM

## 2021-05-12 DIAGNOSIS — Z87891 Personal history of nicotine dependence: Secondary | ICD-10-CM

## 2021-05-12 DIAGNOSIS — I10 Essential (primary) hypertension: Secondary | ICD-10-CM

## 2021-05-14 ENCOUNTER — Telehealth: Payer: Self-pay

## 2021-05-14 ENCOUNTER — Telehealth: Payer: Self-pay | Admitting: Oncology

## 2021-05-14 DIAGNOSIS — E7800 Pure hypercholesterolemia, unspecified: Secondary | ICD-10-CM

## 2021-05-14 NOTE — Telephone Encounter (Signed)
Patient called was not able to respond to my chart message that was sent. He would like to make any changes Dr. Diona Browner recommends just send in to pharmacy and they will let him know when to pick up.  ?

## 2021-05-14 NOTE — Telephone Encounter (Signed)
Called patient regarding upcoming appointments, patient has been called and notified. 

## 2021-05-15 ENCOUNTER — Inpatient Hospital Stay: Payer: Medicare HMO | Attending: Oncology

## 2021-05-15 ENCOUNTER — Inpatient Hospital Stay (HOSPITAL_BASED_OUTPATIENT_CLINIC_OR_DEPARTMENT_OTHER): Payer: Medicare HMO | Admitting: Oncology

## 2021-05-15 ENCOUNTER — Other Ambulatory Visit: Payer: Self-pay

## 2021-05-15 VITALS — BP 135/90 | HR 87 | Temp 97.9°F | Resp 17 | Ht 65.5 in | Wt 198.3 lb

## 2021-05-15 DIAGNOSIS — C44711 Basal cell carcinoma of skin of unspecified lower limb, including hip: Secondary | ICD-10-CM

## 2021-05-15 LAB — CBC WITH DIFFERENTIAL (CANCER CENTER ONLY)
Abs Immature Granulocytes: 0.02 10*3/uL (ref 0.00–0.07)
Basophils Absolute: 0 10*3/uL (ref 0.0–0.1)
Basophils Relative: 0 %
Eosinophils Absolute: 0.2 10*3/uL (ref 0.0–0.5)
Eosinophils Relative: 3 %
HCT: 45.1 % (ref 39.0–52.0)
Hemoglobin: 15.5 g/dL (ref 13.0–17.0)
Immature Granulocytes: 0 %
Lymphocytes Relative: 23 %
Lymphs Abs: 1.6 10*3/uL (ref 0.7–4.0)
MCH: 31 pg (ref 26.0–34.0)
MCHC: 34.4 g/dL (ref 30.0–36.0)
MCV: 90.2 fL (ref 80.0–100.0)
Monocytes Absolute: 0.8 10*3/uL (ref 0.1–1.0)
Monocytes Relative: 11 %
Neutro Abs: 4.3 10*3/uL (ref 1.7–7.7)
Neutrophils Relative %: 63 %
Platelet Count: 163 10*3/uL (ref 150–400)
RBC: 5 MIL/uL (ref 4.22–5.81)
RDW: 13.1 % (ref 11.5–15.5)
WBC Count: 6.9 10*3/uL (ref 4.0–10.5)
nRBC: 0 % (ref 0.0–0.2)

## 2021-05-15 LAB — CMP (CANCER CENTER ONLY)
ALT: 41 U/L (ref 0–44)
AST: 27 U/L (ref 15–41)
Albumin: 4.3 g/dL (ref 3.5–5.0)
Alkaline Phosphatase: 53 U/L (ref 38–126)
Anion gap: 9 (ref 5–15)
BUN: 15 mg/dL (ref 8–23)
CO2: 27 mmol/L (ref 22–32)
Calcium: 9.9 mg/dL (ref 8.9–10.3)
Chloride: 107 mmol/L (ref 98–111)
Creatinine: 1.2 mg/dL (ref 0.61–1.24)
GFR, Estimated: >60 mL/min (ref >60)
Glucose, Bld: 156 mg/dL — ABNORMAL HIGH (ref 70–99)
Potassium: 5.4 mmol/L — ABNORMAL HIGH (ref 3.5–5.1)
Sodium: 143 mmol/L (ref 135–145)
Total Bilirubin: 0.7 mg/dL (ref 0.3–1.2)
Total Protein: 7.3 g/dL (ref 6.5–8.1)

## 2021-05-15 MED ORDER — FENOFIBRATE 145 MG PO TABS: 145.0000 mg | ORAL_TABLET | Freq: Every day | ORAL | 11 refills | Status: DC

## 2021-05-15 NOTE — Addendum Note (Signed)
Addended by: Eliezer Lofts E on: 05/15/2021 09:27 AM ? ? Modules accepted: Orders ? ?

## 2021-05-15 NOTE — Telephone Encounter (Signed)
Mr. Gerry notified as instructed by telephone.  Patient states understanding.  Lab appointment scheduled for 06/13/21 at 9:15 am.  Medication list updated.  ?

## 2021-05-15 NOTE — Telephone Encounter (Signed)
? ?  Per pt response.. he cannot take the 80 mg of atorvastatin and can only tolerate 40 mg.  This is what he is taking.  [facility].  He will need to take this in addition to the atorvastatin.  Please make him a lab appointment to check cholesterol in 1 month. ?Also please change his medicine list to reflect that he is only taking 40 mg of atorvastatin. ? ?Meds ordered this encounter  ?Medications  ? fenofibrate (TRICOR) 145 MG tablet  ?  Sig: Take 1 tablet (145 mg total) by mouth daily.  ?  Dispense:  30 tablet  ?  Refill:  11  ? ? ?Orders Placed This Encounter  ?Procedures  ? Lipid panel  ?  Standing Status:   Future  ?  Standing Expiration Date:   05/16/2022  ? ? ?

## 2021-05-15 NOTE — Progress Notes (Signed)
Hematology and Oncology Follow Up Visit ? ?BIRD SWETZ ?[id number] ?01-15-1945 76 y.o. ?05/15/2021 1:17 PM ?Roy [name], MDBedsole, [name], MD  ? ?Principle Diagnosis: 76 year old with recurrent basal cell carcinoma diagnosed in January 2023 in the left lower extremity.  He initially diagnosed back in 2015 and had multiple recurrences in the past. ? ? ? ?Current therapy: Erivedge 150 mg daily started in March 2023. ? ? ?Interim History: Roy [name] returns today for a follow-up visit.  Since last visit, he started Erivedge without any complications.  He denies any nausea, vomiting or fatigue.  He does report some decline in his energy and occasional cramps.  He is eating well and appetite remained reasonable.  He denies any other complications related to Erivedge. ? ? ? ? ?Medications: I have reviewed the patient's current medications.  ?Current Outpatient Medications  ?Medication Sig Dispense Refill  ? acetaminophen (TYLENOL) 325 MG tablet Take 2 tablets (650 mg total) by mouth every 6 (six) hours as needed. 30 tablet 0  ? atorvastatin (LIPITOR) 40 MG tablet Take 40 mg by mouth daily.    ? carvedilol (COREG) 12.5 MG tablet Take 1 tablet (12.5 mg total) by mouth 2 (two) times daily with a meal. Please make yearly appt with Dr. Johnsie Cancel in April 2023 for future refills. Thank you 1st attempt 180 tablet 0  ? clopidogrel (PLAVIX) 75 MG tablet Take 1 tablet (75 mg total) by mouth daily. Please make overdue appt with Dr. Johnsie Cancel before anymore refills. 2nd attempt 15 tablet 0  ? co-enzyme Q-10 30 MG capsule Take 30 mg by mouth daily.    ? feeding supplement (ENSURE IMMUNE HEALTH) LIQD Take 237 mLs by mouth in the morning.    ? fenofibrate (TRICOR) 145 MG tablet Take 1 tablet (145 mg total) by mouth daily. 30 tablet 11  ? fluocinonide (LIDEX) 0.05 % external solution Apply 1 application topically at bedtime as needed (psoriasis).    ? hydrocortisone cream 1 % Apply 1 application topically 2 (two) times daily as needed  for itching.    ? nitroGLYCERIN (NITROSTAT) 0.4 MG SL tablet Place 1 tablet (0.4 mg total) under the tongue every 5 (five) minutes x 3 doses as needed for chest pain. 30 tablet 0  ? Omega-3 Fatty Acids (FISH OIL) 1200 MG CPDR Take 1,200 mg by mouth.    ? omeprazole (PRILOSEC) 20 MG capsule TAKE 1 CAPSULE BY MOUTH EVERY DAY 90 capsule 1  ? traZODone (DESYREL) 50 MG tablet Take 0.5-1 tablets (25-50 mg total) by mouth at bedtime as needed for sleep. 30 tablet 3  ? triamcinolone (KENALOG) 0.1 % Apply 1 application topically 3 (three) times daily as needed (psoriasis).    ? venlafaxine XR (EFFEXOR-XR) 75 MG 24 hr capsule TAKE 1 CAPSULE BY MOUTH DAILY WITH BREAKFAST. 90 capsule 1  ? vismodegib (ERIVEDGE) 150 MG capsule Take 1 capsule (150 mg total) by mouth daily. 60 capsule 1  ? ?No current facility-administered medications for this visit.  ? ? ? ?Allergies: No Known Allergies ? ? ? ?Physical Exam: ?Blood pressure 135/90, pulse 87, temperature 97.9 ?F (36.6 ?C), temperature source Temporal, resp. rate 17, height 5' 5.5" (1.664 m), weight 198 lb 4.8 oz (89.9 kg), SpO2 100 %. ? ?ECOG: 1 ? ? ?General appearance: Comfortable appearing without any discomfort ?Head: Normocephalic without any trauma ?Oropharynx: Mucous membranes are moist and pink without any thrush or ulcers. ?Eyes: Pupils are equal and round reactive to light. ?Lymph nodes: No cervical, supraclavicular,  inguinal or axillary lymphadenopathy.   ?Heart:regular rate and rhythm.  S1 and S2 without leg edema. ?Lung: Clear without any rhonchi or wheezes.  No dullness to percussion. ?Abdomin: Soft, nontender, nondistended with good bowel sounds.  No hepatosplenomegaly. ?Musculoskeletal: No joint deformity or effusion.  Full range of motion noted. ?Neurological: No deficits noted on motor, sensory and deep tendon reflex exam. ?Skin: Discoloration noted on his left ankle and left lower extremity.  Appears to to be healing without any raised or scaly component to  it. ? ? ? ?Lab Results: ?Lab Results  ?Component Value Date  ? WBC 6.9 05/15/2021  ? HGB 15.5 05/15/2021  ? HCT 45.1 05/15/2021  ? MCV 90.2 05/15/2021  ? PLT 163 05/15/2021  ? ?  Chemistry   ?   ?Component Value Date/Time  ? NA 140 05/10/2021 1534  ? K 4.6 05/10/2021 1534  ? CL 104 05/10/2021 1534  ? CO2 27 05/10/2021 1534  ? BUN 14 05/10/2021 1534  ? CREATININE 1.11 05/10/2021 1534  ?    ?Component Value Date/Time  ? CALCIUM 9.1 05/10/2021 1534  ? ALKPHOS 60 05/02/2020 0929  ? AST 27 05/10/2021 1534  ? ALT 38 05/10/2021 1534  ? BILITOT 0.5 05/10/2021 1534  ? BILITOT 0.3 03/26/2016 0736  ?  ? ? ? ? ? ?Impression and Plan: ? ? ?76 year old with: ? ?1.  Basal cell carcinoma of the skin involving the left lower extremity over the ankle and tibia noticed in January 2023.  He had recurrent disease dating back to 2015.   ? ?He is currently on Erivedge which she has tolerated very well without any major complications.  He had a reasonable response to therapy with improvement in his left lower extremity lesion.  These lesions are becoming less ulcerated and less visible.  Risks and benefits of continuing the current treatment were discussed at this time.  Given his positive response to therapy I recommended continued treatment for another 4 weeks and reevaluation.  Therapy discontinuation could be considered at that time. ? ?2.  Cramps: Unclear if this is related to Erivedge versus his statin.  We will continue to monitor at this time. ?  ?2.  Follow-up: In 4 weeks for repeat follow-up. ? ?30  minutes were spent on this encounter.  The time was dedicated to reviewing his disease status, laboratory data review, treatment choices and addressing complication related to his cancer and cancer therapy. ? ? ? ?Roy Button, MD ?5/3/20231:17 PM ? ?

## 2021-05-15 NOTE — Addendum Note (Signed)
Addended by: Carter Kitten on: 05/15/2021 09:41 AM ? ? Modules accepted: Orders ? ?

## 2021-05-18 ENCOUNTER — Other Ambulatory Visit: Payer: Self-pay | Admitting: Cardiovascular Disease

## 2021-05-21 ENCOUNTER — Telehealth: Payer: Self-pay | Admitting: Oncology

## 2021-05-21 NOTE — Telephone Encounter (Signed)
Called patient regarding upcoming appointments, patient is notified. 

## 2021-05-24 ENCOUNTER — Other Ambulatory Visit: Payer: Self-pay | Admitting: Cardiovascular Disease

## 2021-06-01 ENCOUNTER — Other Ambulatory Visit: Payer: Self-pay | Admitting: Family Medicine

## 2021-06-02 ENCOUNTER — Other Ambulatory Visit: Payer: Self-pay | Admitting: Family Medicine

## 2021-06-03 ENCOUNTER — Other Ambulatory Visit: Payer: Self-pay | Admitting: Cardiovascular Disease

## 2021-06-03 ENCOUNTER — Other Ambulatory Visit: Payer: Self-pay | Admitting: Family Medicine

## 2021-06-06 ENCOUNTER — Encounter: Payer: Self-pay | Admitting: Family Medicine

## 2021-06-12 ENCOUNTER — Other Ambulatory Visit: Payer: Self-pay

## 2021-06-12 ENCOUNTER — Inpatient Hospital Stay (HOSPITAL_BASED_OUTPATIENT_CLINIC_OR_DEPARTMENT_OTHER): Payer: Medicare HMO | Admitting: Oncology

## 2021-06-12 ENCOUNTER — Inpatient Hospital Stay: Payer: Medicare HMO

## 2021-06-12 VITALS — BP 141/97 | HR 105 | Temp 97.7°F | Resp 17 | Wt 198.0 lb

## 2021-06-12 DIAGNOSIS — C44711 Basal cell carcinoma of skin of unspecified lower limb, including hip: Secondary | ICD-10-CM

## 2021-06-12 LAB — CMP (CANCER CENTER ONLY)
ALT: 22 U/L (ref 0–44)
AST: 22 U/L (ref 15–41)
Albumin: 4 g/dL (ref 3.5–5.0)
Alkaline Phosphatase: 29 U/L — ABNORMAL LOW (ref 38–126)
Anion gap: 8 (ref 5–15)
BUN: 21 mg/dL (ref 8–23)
CO2: 24 mmol/L (ref 22–32)
Calcium: 9.6 mg/dL (ref 8.9–10.3)
Chloride: 108 mmol/L (ref 98–111)
Creatinine: 1.55 mg/dL — ABNORMAL HIGH (ref 0.61–1.24)
GFR, Estimated: 46 mL/min — ABNORMAL LOW (ref >60)
Glucose, Bld: 151 mg/dL — ABNORMAL HIGH (ref 70–99)
Potassium: 4.9 mmol/L (ref 3.5–5.1)
Sodium: 140 mmol/L (ref 135–145)
Total Bilirubin: 0.7 mg/dL (ref 0.3–1.2)
Total Protein: 7.5 g/dL (ref 6.5–8.1)

## 2021-06-12 LAB — CBC WITH DIFFERENTIAL (CANCER CENTER ONLY)
Abs Immature Granulocytes: 0.05 10*3/uL (ref 0.00–0.07)
Basophils Absolute: 0 10*3/uL (ref 0.0–0.1)
Basophils Relative: 0 %
Eosinophils Absolute: 0.1 10*3/uL (ref 0.0–0.5)
Eosinophils Relative: 2 %
HCT: 42.8 % (ref 39.0–52.0)
Hemoglobin: 15.1 g/dL (ref 13.0–17.0)
Immature Granulocytes: 1 %
Lymphocytes Relative: 21 %
Lymphs Abs: 1.8 10*3/uL (ref 0.7–4.0)
MCH: 31.5 pg (ref 26.0–34.0)
MCHC: 35.3 g/dL (ref 30.0–36.0)
MCV: 89.2 fL (ref 80.0–100.0)
Monocytes Absolute: 1 10*3/uL (ref 0.1–1.0)
Monocytes Relative: 11 %
Neutro Abs: 5.7 10*3/uL (ref 1.7–7.7)
Neutrophils Relative %: 65 %
Platelet Count: 209 10*3/uL (ref 150–400)
RBC: 4.8 MIL/uL (ref 4.22–5.81)
RDW: 12.7 % (ref 11.5–15.5)
WBC Count: 8.7 10*3/uL (ref 4.0–10.5)
nRBC: 0 % (ref 0.0–0.2)

## 2021-06-12 NOTE — Progress Notes (Signed)
Hematology and Oncology Follow Up Visit  Roy [name] [id number] 10-10-1945 76 y.o. 06/12/2021 2:54 PM Diona Browner, [name], MDBedsole, [name], MD   Principle Diagnosis: 76 year old with basal cell carcinoma of the left lower extremity diagnosed in January 2023.  He developed recurrent disease after originally diagnosed in 2015.  Current therapy: Erivedge 150 mg daily started in March 2023.   Interim History: Roy [name] is here for repeat follow-up.  Since last visit, he reported few complications related to this treatment.  He reported cramps and lack of taste for most foods.  His appetite remained reasonable and his weight is unchanged.  He denies any nausea, vomiting or abdominal pain.  He denies any hospitalizations or illnesses.  He reported that he discontinued her vision the last week with improvement in his cramps.     Medications: Updated on review. Current Outpatient Medications  Medication Sig Dispense Refill   acetaminophen (TYLENOL) 325 MG tablet Take 2 tablets (650 mg total) by mouth every 6 (six) hours as needed. 30 tablet 0   atorvastatin (LIPITOR) 40 MG tablet Take 40 mg by mouth daily.     carvedilol (COREG) 12.5 MG tablet Take 1 tablet (12.5 mg total) by mouth 2 (two) times daily with a meal. Please make yearly appt with Dr. Johnsie Cancel in April 2023 for future refills. Thank you 1st attempt 180 tablet 0   clopidogrel (PLAVIX) 75 MG tablet Take 1 tablet (75 mg total) by mouth daily. Please schedule overdue appointment for future refills. 3rd and final attempt. Thank you 15 tablet 0   co-enzyme Q-10 30 MG capsule Take 30 mg by mouth daily.     feeding supplement (ENSURE IMMUNE HEALTH) LIQD Take 237 mLs by mouth in the morning.     fenofibrate (TRICOR) 145 MG tablet Take 1 tablet (145 mg total) by mouth daily. 30 tablet 11   fluocinonide (LIDEX) 0.05 % external solution Apply 1 application topically at bedtime as needed (psoriasis).     hydrocortisone cream 1 % Apply 1  application topically 2 (two) times daily as needed for itching.     nitroGLYCERIN (NITROSTAT) 0.4 MG SL tablet Place 1 tablet (0.4 mg total) under the tongue every 5 (five) minutes x 3 doses as needed for chest pain. 30 tablet 0   Omega-3 Fatty Acids (FISH OIL) 1200 MG CPDR Take 1,200 mg by mouth.     omeprazole (PRILOSEC) 20 MG capsule TAKE 1 CAPSULE BY MOUTH EVERY DAY 90 capsule 1   traZODone (DESYREL) 50 MG tablet TAKE 0.5-1 TABLETS BY MOUTH AT BEDTIME AS NEEDED FOR SLEEP. 90 tablet 0   triamcinolone (KENALOG) 0.1 % Apply 1 application topically 3 (three) times daily as needed (psoriasis).     venlafaxine XR (EFFEXOR-XR) 75 MG 24 hr capsule TAKE 1 CAPSULE BY MOUTH DAILY WITH BREAKFAST. 90 capsule 1   vismodegib (ERIVEDGE) 150 MG capsule Take 1 capsule (150 mg total) by mouth daily. 60 capsule 1   No current facility-administered medications for this visit.     Allergies: No Known Allergies    Physical Exam: Blood pressure (!) 141/97, pulse (!) 105, temperature 97.7 F (36.5 C), temperature source Temporal, resp. rate 17, weight 198 lb (89.8 kg), SpO2 94 %.   ECOG: 1   General appearance: Alert, awake without any distress. Head: Atraumatic without abnormalities Oropharynx: Without any thrush or ulcers. Eyes: No scleral icterus. Lymph nodes: No lymphadenopathy noted in the cervical, supraclavicular, or axillary nodes Heart:regular rate and rhythm, without any murmurs  or gallops.   Lung: Clear to auscultation without any rhonchi, wheezes or dullness to percussion. Abdomin: Soft, nontender without any shifting dullness or ascites. Musculoskeletal: No clubbing or cyanosis. Neurological: No motor or sensory deficits. Skin: Well-healed scar noted on the medial aspect of his left leg indicating treated tumor.    Lab Results: Lab Results  Component Value Date   WBC 6.9 05/15/2021   HGB 15.5 05/15/2021   HCT 45.1 05/15/2021   MCV 90.2 05/15/2021   PLT 163 05/15/2021      Chemistry      Component Value Date/Time   NA 143 05/15/2021 1307   K 5.4 (H) 05/15/2021 1307   CL 107 05/15/2021 1307   CO2 27 05/15/2021 1307   BUN 15 05/15/2021 1307   CREATININE 1.20 05/15/2021 1307   CREATININE 1.11 05/10/2021 1534      Component Value Date/Time   CALCIUM 9.9 05/15/2021 1307   ALKPHOS 53 05/15/2021 1307   AST 27 05/15/2021 1307   ALT 41 05/15/2021 1307   BILITOT 0.7 05/15/2021 1307         Impression and Plan:   76 year old with:  1.  Recurrent basal cell carcinoma of the skin involving the left lower extremity diagnosed in January 2023.     He is currently on Erivedge which she has tolerated very well with excellent response to therapy.  Risks and benefits of continuing this treatment were discussed at this time.  After discussion today, we opted to discontinue treatment for the time being.  And monitor for tumor recurrence.  He has relapsed disease will consider restarting Erivedge at that time.  2.  Cramps: Related to Erivedge and has improved since therapy discontinuation.   3.  Follow-up: In 3 months for a follow-up visit.  30  minutes were dedicated to this visit.  The time was spent on reviewing disease status, treatment choices and addressing complications related to his cancer and cancer therapy.    Zola Button, MD 5/31/20232:54 PM

## 2021-06-13 ENCOUNTER — Other Ambulatory Visit (INDEPENDENT_AMBULATORY_CARE_PROVIDER_SITE_OTHER): Payer: Medicare HMO

## 2021-06-13 DIAGNOSIS — E7800 Pure hypercholesterolemia, unspecified: Secondary | ICD-10-CM

## 2021-06-13 LAB — LDL CHOLESTEROL, DIRECT: Direct LDL: 171 mg/dL

## 2021-06-13 LAB — LIPID PANEL
Cholesterol: 305 mg/dL — ABNORMAL HIGH (ref 0–200)
HDL: 59.9 mg/dL (ref >39.00)
Total CHOL/HDL Ratio: 5
Triglycerides: 472 mg/dL — ABNORMAL HIGH (ref 0.0–149.0)

## 2021-06-15 ENCOUNTER — Encounter: Payer: Self-pay | Admitting: Family Medicine

## 2021-06-19 DIAGNOSIS — R1032 Left lower quadrant pain: Secondary | ICD-10-CM | POA: Diagnosis not present

## 2021-06-22 ENCOUNTER — Other Ambulatory Visit: Payer: Self-pay | Admitting: Cardiovascular Disease

## 2021-06-24 ENCOUNTER — Ambulatory Visit (INDEPENDENT_AMBULATORY_CARE_PROVIDER_SITE_OTHER): Payer: Medicare HMO

## 2021-06-24 DIAGNOSIS — I10 Essential (primary) hypertension: Secondary | ICD-10-CM

## 2021-06-24 DIAGNOSIS — I2510 Atherosclerotic heart disease of native coronary artery without angina pectoris: Secondary | ICD-10-CM

## 2021-06-24 DIAGNOSIS — E785 Hyperlipidemia, unspecified: Secondary | ICD-10-CM

## 2021-06-24 DIAGNOSIS — Z9181 History of falling: Secondary | ICD-10-CM

## 2021-06-24 DIAGNOSIS — A046 Enteritis due to Yersinia enterocolitica: Secondary | ICD-10-CM

## 2021-06-24 DIAGNOSIS — C44711 Basal cell carcinoma of skin of unspecified lower limb, including hip: Secondary | ICD-10-CM

## 2021-06-25 NOTE — Chronic Care Management (AMB) (Signed)
Chronic Care Management   CCM RN Visit Note  06/25/2021 Name: Roy [name] MRN: [id number] DOB: [birth year] Late entry for 06/24/21  Subjective: Roy [name] is a 76 y.o. year old male who is a primary care patient of [name], [name], MD. The care management team was consulted for assistance with disease management and care coordination needs.    Engaged with patient by telephone for follow up visit in response to provider referral for case management and/or care coordination services.   Consent to Services:  The patient was given information about Chronic Care Management services, agreed to services, and gave verbal consent prior to initiation of services.  Please see initial visit note for detailed documentation.   Patient agreed to services and verbal consent obtained.   Assessment: Review of patient past medical history, allergies, medications, health status, including review of consultants reports, laboratory and other test data, was performed as part of comprehensive evaluation and provision of chronic care management services.   SDOH (Social Determinants of Health) assessments and interventions performed:    CCM Care Plan  No Known Allergies  Outpatient Encounter Medications as of 06/24/2021  Medication Sig Note   carvedilol (COREG) 12.5 MG tablet Take 1 tablet (12.5 mg total) by mouth 2 (two) times daily with a meal. Please make yearly appt with Dr. Johnsie Cancel in April 2023 for future refills. Thank you 1st attempt    clopidogrel (PLAVIX) 75 MG tablet Take 1 tablet (75 mg total) by mouth daily. Please schedule overdue appointment for future refills. 3rd and final attempt. Thank you    co-enzyme Q-10 30 MG capsule Take 30 mg by mouth daily.    fluocinonide (LIDEX) 0.05 % external solution Apply 1 application topically at bedtime as needed (psoriasis).    hydrocortisone cream 1 % Apply 1 application topically 2 (two) times daily as needed for itching.    nitroGLYCERIN  (NITROSTAT) 0.4 MG SL tablet Place 1 tablet (0.4 mg total) under the tongue every 5 (five) minutes x 3 doses as needed for chest pain.    omeprazole (PRILOSEC) 20 MG capsule TAKE 1 CAPSULE BY MOUTH EVERY DAY    traZODone (DESYREL) 50 MG tablet TAKE 0.5-1 TABLETS BY MOUTH AT BEDTIME AS NEEDED FOR SLEEP.    triamcinolone (KENALOG) 0.1 % Apply 1 application topically 3 (three) times daily as needed (psoriasis).    venlafaxine XR (EFFEXOR-XR) 75 MG 24 hr capsule TAKE 1 CAPSULE BY MOUTH DAILY WITH BREAKFAST.    acetaminophen (TYLENOL) 325 MG tablet Take 2 tablets (650 mg total) by mouth every 6 (six) hours as needed.    atorvastatin (LIPITOR) 40 MG tablet Take 40 mg by mouth daily.    feeding supplement (ENSURE IMMUNE HEALTH) LIQD Take 237 mLs by mouth in the morning.    fenofibrate (TRICOR) 145 MG tablet Take 1 tablet (145 mg total) by mouth daily. 06/24/2021: Patient states he is taking this medication as prescribed.    Omega-3 Fatty Acids (FISH OIL) 1200 MG CPDR Take 1,200 mg by mouth. (Patient not taking: Reported on 06/24/2021)    vismodegib (ERIVEDGE) 150 MG capsule Take 1 capsule (150 mg total) by mouth daily. (Patient not taking: Reported on 06/24/2021)    No facility-administered encounter medications on file as of 06/24/2021.    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) 07/23/2019   Insomnia 07/23/2019   Personal history of multiple sclerosis (HCC) 12/02/2018   Weakness 03/31/2017   Internal hemorrhoids with complication 05/25/2015   CAD, RCA PCI 2001, urgent Dx2 DES 04/24/14 04/24/2014   Unstable angina with ST elelvation and NSVT while on treadmill 04/24/14    History of basal cell carcinoma 03/07/2014    GERD (gastroesophageal reflux disease) 03/07/2014   Incomplete emptying of bladder 03/07/2014   Obstructive sleep apnea 08/20/2009   Dyslipidemia 11/09/2008   Depression, major, recurrent (HCC) 07/06/2007   Essential hypertension 07/06/2007   Coronary artery disease involving native coronary artery of native heart without angina pectoris 07/06/2007    Conditions to be addressed/monitored:CAD, HTN, and Falls, basal cell cancer  Care Plan : RNCM plan of care  Updates made by ,  E, RN since 06/25/2021 12:00 AM     Problem: chronic disease management education and/ or care coordination   Priority: High     Long-Range Goal: Development of plan of care to address chronic disease management and/ or care coordination needs.   Start Date: 12/20/2020  Expected End Date: 07/12/2021  Priority: High  Note:   Current Barriers:  Knowledge Deficits related to plan of care for management of CAD, HTN, and falls  Chronic Disease Management support and education needs related to CAD, HTN, and falls Patient states he is doing ok.  He reports having his annual wellness exam on 05/10/21.   He states his cholesterol / triglycerides were very high. Per chart review total cholesterol 305/ triglycerides 472.   He states he is taking fenofibrate and following a healthier diet.  Patient reports having follow up with his oncologist on 06/12/21. Patient states his oncologist stopped his oral chemotherapy medication.  He states the basal cell cancer responded well to treatment. Patient states he doesn't have any taste since stopping the medication. He states if this does not get better within the week he will report it to his oncologist. Patient reports having a fall since last outreach with RNCM.  He states he was having a lot of leg cramps from either his chemo or statin medication or both which caused him to fall.  Denies fall injury.  Patient states he is scheduled to have an eye appointment this month.        RNCM Clinical Goal(s):  Patient will verbalize basic understanding of CAD, HTN, and falls disease process and self health management plan as evidenced by patient report and/ or notation in chart take all medications exactly as prescribed and will call provider for medication related questions as evidenced by patient report and/ or notation in chart    attend all scheduled medical appointments:   as evidenced by patient report and/ or notation in chart        continue to work with RN Care Manager and/or Social Worker to address care management and care coordination needs related to CAD, HTN, and falls as evidenced by adherence to CM Team Scheduled appointments     through collaboration with RN Care manager, provider, and care team.   Interventions: 1:1 collaboration with primary care provider regarding development and update of comprehensive plan of care as evidenced by provider attestation and co-signature Inter-disciplinary care team collaboration (see longitudinal plan of care) Evaluation of current treatment plan related to  self management and patient's adherence to plan as established by provider     CAD Interventions: (Status:  Goal on track:  Yes.) Long Term Goal Medications reviewed and discussed importance of compliance.  Reviewed Importance of taking all medications as prescribed Reviewed Importance of attending all scheduled provider appointments Encouraged patient to follow heart healthy diet and low salt diet.  Heart healthy diet education article sent to patient.    Falls Interventions:  (Status:  Goal on track:  Yes.) Long Term Goal Advised patient of importance of notifying provider of falls Assessed for falls since last encounter Reviewed fall precautions.   Skin (basal cell) cancer:  ( status: New goal):  Long term Reviewed scheduled / upcoming provider appointments Discussed plans with patient for ongoing care management follow up and provided patient with direct contact  information for care management team Advised patient to notify provider if taste symptoms do not return   Hypertension Interventions:  (Status:  Goal on track:  Yes.) Long Term Goal Last practice recorded BP readings:  BP Readings from Last 3 Encounters:  06/12/21 (!) 141/97  05/15/21 135/90  05/10/21 100/70  Most recent eGFR/CrCl: No results found for: "EGFR"  No components found for: "CRCL"  Evaluation of current treatment plan related to hypertension self management and patient's adherence to plan as established by provider Reviewed medications with patient and discussed importance of compliance Discussed plans with patient for ongoing care management follow up and provided patient with direct contact information for care management team Reviewed scheduled/upcoming provider appointments i  Encouraged patient to follow low salt diet.   Patient Goals/Self-Care Activities: Continue to take medications as prescribed   Attend all scheduled provider appointments Call pharmacy for medication refills 3-7 days in advance of running out of medications Call provider office for new concerns or questions  - Keep walkways clear of clutter and avoid throw rugs or use non slip rugs -  Consider monitoring your blood pressure at least 1 time per week.      -  Review the heart healthy education article sent to you in MyChart.  Continue to adhere to a heart healthy diet consisting of fruits, vegetables and whole grains - and low in saturated fat, cholesterol, sodium (salt) and added sugar. Follow low salt / DASH diet Notify your provider if your taste does not return          Plan:The patient has been provided with contact information for the care management team and has been advised to call with any health related questions or concerns.  The care management team will reach out to the patient again over the next 2 months .   RN,BSN,CCM RN Case Manager [place]   [phone]          

## 2021-06-25 NOTE — Patient Instructions (Signed)
Visit Information  Thank you for taking time to visit with me today. Please don't hesitate to contact me if I can be of assistance to you before our next scheduled telephone appointment.  Following are the goals we discussed today:  Continue to take medications as prescribed   Attend all scheduled provider appointments Call pharmacy for medication refills 3-7 days in advance of running out of medications Call provider office for new concerns or questions  - Keep walkways clear of clutter and avoid throw rugs or use non slip rugs -  Consider monitoring your blood pressure at least 1 time per week.      -  Review the heart healthy education article sent to you in MyChart.  Continue to adhere to a heart healthy diet consisting of fruits, vegetables and whole grains - and low in saturated fat, cholesterol, sodium (salt) and added sugar. Follow low salt / DASH diet Notify your provider if your taste does not return   Our next appointment is by telephone on 09/03/21 at 2:00 pm  Please call the care guide team at [phone] if you need to cancel or reschedule your appointment.   If you are experiencing a Mental Health or [place] or need someone to talk to, please   call the Suicide and Crisis Lifeline: 988 call 1-800-273-TALK (toll free, 24 hour hotline) Patient verbalizes understanding of instructions and care plan provided today and agrees to view in [place]. Active MyChart status and patient understanding of how to access instructions and care plan via MyChart confirmed with patient.     Quinn Plowman RN,BSN,CCM RN Case Manager [place]  [phone]

## 2021-06-29 ENCOUNTER — Encounter: Payer: Self-pay | Admitting: Oncology

## 2021-07-04 DIAGNOSIS — Z961 Presence of intraocular lens: Secondary | ICD-10-CM | POA: Diagnosis not present

## 2021-07-04 DIAGNOSIS — H5203 Hypermetropia, bilateral: Secondary | ICD-10-CM | POA: Diagnosis not present

## 2021-07-04 DIAGNOSIS — H52223 Regular astigmatism, bilateral: Secondary | ICD-10-CM | POA: Diagnosis not present

## 2021-07-04 DIAGNOSIS — E119 Type 2 diabetes mellitus without complications: Secondary | ICD-10-CM | POA: Diagnosis not present

## 2021-07-04 DIAGNOSIS — H524 Presbyopia: Secondary | ICD-10-CM | POA: Diagnosis not present

## 2021-07-04 LAB — HM DIABETES EYE EXAM

## 2021-07-08 ENCOUNTER — Encounter: Payer: Self-pay | Admitting: Family Medicine

## 2021-07-11 ENCOUNTER — Other Ambulatory Visit: Payer: Self-pay | Admitting: Family Medicine

## 2021-07-12 DIAGNOSIS — I10 Essential (primary) hypertension: Secondary | ICD-10-CM

## 2021-07-12 DIAGNOSIS — I2510 Atherosclerotic heart disease of native coronary artery without angina pectoris: Secondary | ICD-10-CM

## 2021-08-08 ENCOUNTER — Other Ambulatory Visit: Payer: Self-pay | Admitting: Cardiovascular Disease

## 2021-08-13 ENCOUNTER — Ambulatory Visit: Payer: Medicare HMO | Admitting: Family Medicine

## 2021-08-15 ENCOUNTER — Encounter: Payer: Self-pay | Admitting: Family Medicine

## 2021-08-15 ENCOUNTER — Ambulatory Visit (INDEPENDENT_AMBULATORY_CARE_PROVIDER_SITE_OTHER): Payer: Medicare HMO | Admitting: Family Medicine

## 2021-08-15 VITALS — BP 102/74 | HR 79 | Temp 98.2°F | Ht 65.5 in | Wt 191.5 lb

## 2021-08-15 DIAGNOSIS — R69 Illness, unspecified: Secondary | ICD-10-CM | POA: Diagnosis not present

## 2021-08-15 DIAGNOSIS — F331 Major depressive disorder, recurrent, moderate: Secondary | ICD-10-CM

## 2021-08-15 DIAGNOSIS — E1169 Type 2 diabetes mellitus with other specified complication: Secondary | ICD-10-CM | POA: Diagnosis not present

## 2021-08-15 DIAGNOSIS — I152 Hypertension secondary to endocrine disorders: Secondary | ICD-10-CM | POA: Diagnosis not present

## 2021-08-15 DIAGNOSIS — K219 Gastro-esophageal reflux disease without esophagitis: Secondary | ICD-10-CM

## 2021-08-15 DIAGNOSIS — E785 Hyperlipidemia, unspecified: Secondary | ICD-10-CM

## 2021-08-15 DIAGNOSIS — E1159 Type 2 diabetes mellitus with other circulatory complications: Secondary | ICD-10-CM

## 2021-08-15 LAB — POCT GLYCOSYLATED HEMOGLOBIN (HGB A1C): Hemoglobin A1C: 6.6 % — AB (ref 4.0–5.6)

## 2021-08-15 MED ORDER — ATORVASTATIN CALCIUM 80 MG PO TABS: 80.0000 mg | ORAL_TABLET | Freq: Every day | ORAL | 3 refills | Status: DC

## 2021-08-15 NOTE — Assessment & Plan Note (Signed)
Chronic, diet controlled. 

## 2021-08-15 NOTE — Progress Notes (Signed)
Patient ID: Roy [name], male    DOB: 10/16/1945, 76 y.o.   MRN: [id number]  This visit was conducted in person.  BP 102/74   Pulse 79   Temp 98.2 F (36.8 C) (Oral)   Ht 5' 5.5" (1.664 m)   Wt 191 lb 8 oz (86.9 kg)   SpO2 95%   BMI 31.38 kg/m    CC:  Chief Complaint  Patient presents with   Follow-up    Patient states he was called and needed to come in to have his A1c checked    Subjective:   HPI: Roy [name] is a 76 y.o. male presenting on 08/15/2021 for Follow-up (Patient states he was called and needed to come in to have his A1c checked)  Diabetes:  Diet controlled Lab Results  Component Value Date   HGBA1C 6.6 (A) 08/15/2021  Using medications without difficulties: Hypoglycemic episodes: Hyperglycemic episodes: Feet problems: swelling in bilateral ankles in last 3-4 weeks Blood Sugars averaging: Not checking eye exam within last year: Yes  Elevated Cholesterol: Inadequate control..  Stopped atorvastatin given leg cramps but now he feels it may have been secondary to his basal cell carcinoma treatment.  He continues to take Tricor 150 mg daily for elevated triglycerides. Lab Results  Component Value Date   CHOL 305 (H) 06/13/2021   HDL 59.90 06/13/2021   [place]  05/10/2021     Comment:     . LDL cholesterol not calculated. Triglyceride levels greater than 400 mg/dL invalidate calculated LDL results. . Reference range: <100 . Desirable range <100 mg/dL for primary prevention;   <70 mg/dL for patients with CHD or diabetic patients  with > or = 2 CHD risk factors. Marland Kitchen LDL-C is now calculated using the Martin-Hopkins  calculation, which is a validated novel method providing  better accuracy than the Friedewald equation in the  estimation of LDL-C.  Cresenciano Genre et al. Annamaria Helling. 4650;354(65): 2061-2068  (http://education.[facility].com/faq/FAQ164)    LDLDIRECT 171.0 06/13/2021   TRIG (H) 06/13/2021    472.0 Triglyceride is over 400;  calculations on Lipids are invalid.   CHOLHDL 5 06/13/2021  Using medications without problems: Muscle aches:  Diet compliance: good Exercise:  none Other complaints:  Hypertension:   Well controlled on coreg 12.5 mg BID  BP Readings from Last 3 Encounters:  08/15/21 102/74  06/12/21 (!) 141/97  05/15/21 135/90  Using medication without problems or lightheadedness:  Chest pain with exertion: none Edema: yes Short of breath: none Average home BPs: Other issues:     Now off chemo for basal cell... did cause him to lose appetite .Marland Kitchen May have been the cause of the cramps. Wt Readings from Last 3 Encounters:  08/15/21 191 lb 8 oz (86.9 kg)  06/12/21 198 lb (89.8 kg)  05/15/21 198 lb 4.8 oz (89.9 kg)   Body mass index is 31.38 kg/m.   Relevant past medical, surgical, family and social history reviewed and updated as indicated. Interim medical history since our last visit reviewed. Allergies and medications reviewed and updated. Outpatient Medications Prior to Visit  Medication Sig Dispense Refill   carvedilol (COREG) 12.5 MG tablet Take 1 tablet (12.5 mg total) by mouth 2 (two) times daily with a meal. Please make overdue appt with Dr. Johnsie Cancel before anymore refills. Thank you 2nd attempt 30 tablet 0   feeding supplement (ENSURE IMMUNE HEALTH) LIQD Take 237 mLs by mouth in the morning.     fenofibrate (TRICOR) 145 MG tablet Take 145  mg by mouth daily.     fluocinonide (LIDEX) 0.05 % external solution Apply 1 application topically at bedtime as needed (psoriasis).     hydrocortisone cream 1 % Apply 1 application topically 2 (two) times daily as needed for itching.     nitroGLYCERIN (NITROSTAT) 0.4 MG SL tablet Place 1 tablet (0.4 mg total) under the tongue every 5 (five) minutes x 3 doses as needed for chest pain. 30 tablet 0   omeprazole (PRILOSEC) 20 MG capsule TAKE 1 CAPSULE BY MOUTH EVERY DAY 90 capsule 1   traZODone (DESYREL) 50 MG tablet TAKE 0.5-1 TABLETS BY MOUTH AT BEDTIME  AS NEEDED FOR SLEEP. 90 tablet 0   triamcinolone (KENALOG) 0.1 % Apply 1 application topically 3 (three) times daily as needed (psoriasis).     venlafaxine XR (EFFEXOR-XR) 75 MG 24 hr capsule TAKE 1 CAPSULE BY MOUTH DAILY WITH BREAKFAST. 90 capsule 1   clopidogrel (PLAVIX) 75 MG tablet Take 1 tablet (75 mg total) by mouth daily. Please schedule overdue appointment for future refills. 3rd and final attempt. Thank you 15 tablet 0   co-enzyme Q-10 30 MG capsule Take 30 mg by mouth daily.     Omega-3 Fatty Acids (FISH OIL) 1200 MG CPDR Take 1,200 mg by mouth. (Patient not taking: Reported on 06/24/2021)     vismodegib (ERIVEDGE) 150 MG capsule Take 1 capsule (150 mg total) by mouth daily. (Patient not taking: Reported on 06/24/2021) 60 capsule 1   No facility-administered medications prior to visit.     Per HPI unless specifically indicated in ROS section below Review of Systems  Constitutional:  Negative for fatigue and fever.  HENT:  Negative for ear pain.   Eyes:  Negative for pain.  Respiratory:  Negative for cough and shortness of breath.   Cardiovascular:  Negative for chest pain, palpitations and leg swelling.  Gastrointestinal:  Negative for abdominal pain.  Genitourinary:  Negative for dysuria.  Musculoskeletal:  Negative for arthralgias.  Neurological:  Negative for syncope, light-headedness and headaches.  Psychiatric/Behavioral:  Negative for dysphoric mood.    Objective:  BP 102/74   Pulse 79   Temp 98.2 F (36.8 C) (Oral)   Ht 5' 5.5" (1.664 m)   Wt 191 lb 8 oz (86.9 kg)   SpO2 95%   BMI 31.38 kg/m   Wt Readings from Last 3 Encounters:  08/15/21 191 lb 8 oz (86.9 kg)  06/12/21 198 lb (89.8 kg)  05/15/21 198 lb 4.8 oz (89.9 kg)      Physical Exam Constitutional:      Appearance: He is well-developed.  HENT:     Head: Normocephalic.     Right Ear: Hearing normal.     Left Ear: Hearing normal.     Nose: Nose normal.  Neck:     Thyroid: No thyroid mass or  thyromegaly.     Vascular: No carotid bruit.     Trachea: Trachea normal.  Cardiovascular:     Rate and Rhythm: Normal rate and regular rhythm.     Pulses: Normal pulses.     Heart sounds: Heart sounds not distant. No murmur heard.    No friction rub. No gallop.     Comments: No peripheral edema Pulmonary:     Effort: Pulmonary effort is normal. No respiratory distress.     Breath sounds: Normal breath sounds.  Skin:    General: Skin is warm and dry.     Findings: No rash.  Psychiatric:  Speech: Speech normal.        Behavior: Behavior normal.        Thought Content: Thought content normal.       Results for orders placed or performed in visit on 08/15/21  POCT glycosylated hemoglobin (Hb A1C)  Result Value Ref Range   Hemoglobin A1C 6.6 (A) 4.0 - 5.6 %   HbA1c POC (<> result, manual entry)     HbA1c, POC (prediabetic range)     HbA1c, POC (controlled diabetic range)       COVID 19 screen:  No recent travel or known exposure to COVID19 The patient denies respiratory symptoms of COVID 19 at this time. The importance of social distancing was discussed today.   Assessment and Plan Problem List Items Addressed This Visit     Controlled type 2 diabetes mellitus with circulatory disorder ([place]) - Primary    Chronic, diet controlled      Relevant Medications   fenofibrate (TRICOR) 145 MG tablet   atorvastatin (LIPITOR) 80 MG tablet   Other Relevant Orders   POCT glycosylated hemoglobin (Hb A1C) (Completed)   Depression, major, recurrent (HCC)    Chronic, well controlled PHQ-9 8 today Doing well on venlafaxine 75 mg p.o. daily      GERD (gastroesophageal reflux disease)    Resolved, he would like to wean off omeprazole.      Hyperlipidemia associated with type 2 diabetes mellitus ([place])    Chronic, poorly controlled at lab test in June. LDL goal less than 7) given cardiovascular disease He appears he has stopped atorvastatin due to leg cramps (now feels the  cramps could be due to medication he was on for basal cell carcinoma) but he is willing to restart. He will continue Tricor given elevated triglycerides.      Relevant Medications   fenofibrate (TRICOR) 145 MG tablet   atorvastatin (LIPITOR) 80 MG tablet   Hypertension associated with diabetes (HCC)    Stable, chronic.  Continue current medication.   Coreg 12.5 mg p.o. twice daily      Relevant Medications   fenofibrate (TRICOR) 145 MG tablet   atorvastatin (LIPITOR) 80 MG tablet   Meds ordered this encounter  Medications   atorvastatin (LIPITOR) 80 MG tablet    Sig: Take 1 tablet (80 mg total) by mouth daily.    Dispense:  90 tablet    Refill:  3       Eliezer Lofts, MD

## 2021-08-15 NOTE — Assessment & Plan Note (Signed)
Resolved, he would like to wean off omeprazole.

## 2021-08-15 NOTE — Assessment & Plan Note (Addendum)
Chronic, poorly controlled at lab test in June. LDL goal less than 7) given cardiovascular disease He appears he has stopped atorvastatin due to leg cramps (now feels the cramps could be due to medication he was on for basal cell carcinoma) but he is willing to restart. He will continue Tricor given elevated triglycerides.

## 2021-08-15 NOTE — Assessment & Plan Note (Signed)
Stable, chronic.  Continue current medication.   Coreg 12.5 mg p.o. twice daily 

## 2021-08-15 NOTE — Assessment & Plan Note (Signed)
Chronic, well controlled PHQ-9 8 today Doing well on venlafaxine 75 mg p.o. daily

## 2021-08-15 NOTE — Patient Instructions (Addendum)
Restart atorvastatin 80 mg daily.  Continue Tricor (fenofibrate) to help control triglycerides as well.  Work on low [employer], regular exercise and weight loss.  Can decrease the omeprazole 20 mg to every other day for 1-2 weeks then every 2 days for 1 week then off. Can use as needed for heartburn, acid indigestion.

## 2021-08-27 ENCOUNTER — Other Ambulatory Visit: Payer: Self-pay | Admitting: Family Medicine

## 2021-08-27 NOTE — Telephone Encounter (Signed)
Last filled 06/03/21 Last ov 08/15/21

## 2021-09-01 ENCOUNTER — Other Ambulatory Visit: Payer: Self-pay | Admitting: Cardiovascular Disease

## 2021-09-03 ENCOUNTER — Ambulatory Visit: Payer: Medicare HMO

## 2021-09-03 NOTE — Chronic Care Management (AMB) (Signed)
Care Management    RN Visit Note  09/03/2021 Name: Roy [name] MRN: [id number] DOB: 1945/08/01  Subjective: Roy [name] is a 76 y.o. year old male who is a primary care patient of [name], [name], MD. The care management team was consulted for assistance with disease management and care coordination needs.    Engaged with patient by telephone for follow up visit in response to provider referral for case management and/or care coordination services.   Consent to Services:   Roy [name] was given information about Care Management services today including:  Care Management services includes personalized support from designated clinical staff supervised by his physician, including individualized plan of care and coordination with other care providers 24/7 contact phone numbers for assistance for urgent and routine care needs. The patient may stop case management services at any time by phone call to the office staff.  Patient agreed to services and consent obtained.   Assessment: Review of patient past medical history, allergies, medications, health status, including review of consultants reports, laboratory and other test data, was performed as part of comprehensive evaluation and provision of chronic care management services.   SDOH (Social Determinants of Health) assessments and interventions performed:    Care Plan  No Known Allergies  Outpatient Encounter Medications as of 09/03/2021  Medication Sig   atorvastatin (LIPITOR) 80 MG tablet Take 1 tablet (80 mg total) by mouth daily.   carvedilol (COREG) 12.5 MG tablet Take 1 tablet (12.5 mg total) by mouth 2 (two) times daily with a meal. Please make overdue appt with Dr. Eden Emms for additional refills. FINAL ATTEMPT   feeding supplement (ENSURE IMMUNE HEALTH) LIQD Take 237 mLs by mouth in the morning.   fenofibrate (TRICOR) 145 MG tablet Take 145 mg by mouth daily.   fluocinonide (LIDEX) 0.05 % external solution Apply 1  application topically at bedtime as needed (psoriasis).   hydrocortisone cream 1 % Apply 1 application topically 2 (two) times daily as needed for itching.   nitroGLYCERIN (NITROSTAT) 0.4 MG SL tablet Place 1 tablet (0.4 mg total) under the tongue every 5 (five) minutes x 3 doses as needed for chest pain.   omeprazole (PRILOSEC) 20 MG capsule TAKE 1 CAPSULE BY MOUTH EVERY DAY   traZODone (DESYREL) 50 MG tablet TAKE 1/2 TO 1 TABLET BY MOUTH AT BEDTIME AS NEEDED FOR SLEEP   triamcinolone (KENALOG) 0.1 % Apply 1 application topically 3 (three) times daily as needed (psoriasis).   venlafaxine XR (EFFEXOR-XR) 75 MG 24 hr capsule TAKE 1 CAPSULE BY MOUTH DAILY WITH BREAKFAST.   No facility-administered encounter medications on file as of 09/03/2021.    Patient Active Problem List   Diagnosis Date Noted   Basal cell carcinoma (BCC) of skin of ankle 02/12/2021   S/P hernia surgery 09/24/2020   Spinal stenosis 05/08/2020   History of colostomy reversal 03/07/2020   Controlled type 2 diabetes mellitus with circulatory disorder (HCC) 07/23/2019   Insomnia 07/23/2019   Personal history of multiple sclerosis (HCC) 12/02/2018   Internal hemorrhoids with complication 05/25/2015   CAD, RCA PCI 2001, urgent Dx2 DES 04/24/14 04/24/2014   Unstable angina with ST elelvation and NSVT while on treadmill 04/24/14    History of basal cell carcinoma 03/07/2014   GERD (gastroesophageal reflux disease) 03/07/2014   Incomplete emptying of bladder 03/07/2014   Obstructive sleep apnea 08/20/2009   Hyperlipidemia associated with type 2 diabetes mellitus (HCC) 11/09/2008   Depression, major, recurrent (HCC) 07/06/2007   Hypertension  associated with diabetes ([place]) 07/06/2007   Coronary artery disease involving native coronary artery of native heart without angina pectoris 07/06/2007    Conditions to be addressed/monitored: CAD, HTN, HLD, and falls, basal cell cancer  Care Plan : [facility] plan of care  Updates made by  Dannielle Karvonen, RN since 09/03/2021 12:00 AM     Problem: chronic disease management education and/ or care coordination   Priority: High     Long-Range Goal: Development of plan of care to address chronic disease management and/ or care coordination needs.   Start Date: 12/20/2020  Expected End Date: 07/12/2021  Priority: High  Note:   RESOLVED DUE TO DUPLICATE GOAL/ TRANSITIONING TO CARE COORDINATION  Current Barriers:  Knowledge Deficits related to plan of care for management of CAD, HTN, and falls  Chronic Disease Management support and education needs related to CAD, HTN, and falls Patient states he is doing well.  He continues on Tricor and Lipitor as prescribed. Patient denies leg cramps. He states the leg cramps went away once he stopped taking the chemotherapy. Patient reports follow up with cardiologist is 11/05/21 and next lab draws with primary provider office is 02/11/21. Patient denies exercising.  He reports being active with dog rescuing.  Patient states he continues to adhere low carb, heart healthy diet.   He states he continues to be off chemotherapy and has frequent follow up with oncologist.   Patient denies any falls.  RNCM Clinical Goal(s):  Patient will verbalize basic understanding of CAD, HTN, and falls disease process and self health management plan as evidenced by patient report and/ or notation in chart take all medications exactly as prescribed and will call provider for medication related questions as evidenced by patient report and/ or notation in chart    attend all scheduled medical appointments:   as evidenced by patient report and/ or notation in chart        continue to work with RN Care Manager and/or Social Worker to address care management and care coordination needs related to CAD, HTN, and falls as evidenced by adherence to CM Team Scheduled appointments     through collaboration with Consulting civil engineer, provider, and care team.   Interventions: 1:1  collaboration with primary care provider regarding development and update of comprehensive plan of care as evidenced by provider attestation and co-signature Inter-disciplinary care team collaboration (see longitudinal plan of care) Evaluation of current treatment plan related to  self management and patient's adherence to plan as established by provider   CAD Interventions: (Status:  Goal Met.)  Medications reviewed and discussed importance of compliance.  Reviewed Importance of taking all medications as prescribed Reviewed Importance of attending all scheduled provider appointments Encouraged patient to follow heart healthy diet and low salt diet.  Heart healthy diet education article sent to patient.    Hyperlipidemia Interventions:  (Status:  New goal.) Long Term Goal Medication review performed; medication list updated in electronic medical record.  Provider established cholesterol goals reviewed Counseled on importance of regular laboratory monitoring as prescribed Provided HLD educational materials Reviewed importance of limiting foods high in cholesterol   Falls Interventions:  (Status:  Goal Met.) Advised patient of importance of notifying provider of falls Assessed for falls since last encounter Reviewed fall precautions.   Skin (basal cell) cancer:  ( status: Goal met.  Reviewed scheduled / upcoming provider appointments Discussed plans with patient for ongoing care management follow up and provided patient with direct contact information for care  management team Advised patient to notify provider if taste symptoms do not return   Hypertension Interventions:  (Status:  Goal met.  Last practice recorded BP readings:  BP Readings from Last 3 Encounters:  08/15/21 102/74  06/12/21 (!) 141/97  05/15/21 135/90  Most recent eGFR/CrCl: No results found for: "EGFR"  No components found for: "CRCL"  Evaluation of current treatment plan related to hypertension self management  and patient's adherence to plan as established by provider Reviewed medications with patient and discussed importance of compliance Discussed plans with patient for ongoing care management follow up and provided patient with direct contact information for care management team Reviewed scheduled/upcoming provider appointments i  Encouraged patient to follow low salt diet.   Patient Goals/Self-Care Activities: Continue to take medications as prescribed   Attend all scheduled provider appointments Call pharmacy for medication refills 3-7 days in advance of running out of medications Call provider office for new concerns or questions  - Keep walkways clear of clutter and avoid throw rugs or use non slip rugs -  Consider monitoring your blood pressure at least 1 time per week.      -  Review High cholesterol education article sent to you in MyChart.  Continue to adhere to a heart healthy diet consisting of fruits, vegetables and whole grains - and low in saturated fat, cholesterol, sodium (salt) and added sugar.         Plan: The care management team will reach out to the patient again over the next 2 months   Quinn Plowman [facility] RN Care Manager Coordinator [phone]

## 2021-09-03 NOTE — Patient Instructions (Addendum)
Visit Information  Thank you for taking time to visit with me today. Please don't hesitate to contact me if I can be of assistance to you before our next scheduled telephone appointment.  Following are the goals we discussed today:  Continue to take medications as prescribed   Attend all scheduled provider appointments Call pharmacy for medication refills 3-7 days in advance of running out of medications Call provider office for new concerns or questions  - Keep walkways clear of clutter and avoid throw rugs or use non slip rugs -  Consider monitoring your blood pressure at least 1 time per week.      -  Review High cholesterol education article sent to you in MyChart.  Continue to adhere to a heart healthy diet consisting of fruits, vegetables and whole grains - and low in saturated fat, cholesterol, sodium (salt) and added sugar.  Our next appointment is by telephone on 11/07/21 at 2:00 pm  Please call the care guide team at [phone] if you need to cancel or reschedule your appointment.   If you are experiencing a Mental Health or [place] or need someone to talk to, please call the Suicide and Crisis Lifeline: 988 call 1-800-273-TALK (toll free, 24 hour hotline)   Patient verbalizes understanding of instructions and care plan provided today and agrees to view in [place]. Active MyChart status and patient understanding of how to access instructions and care plan via MyChart confirmed with patient.      Quinn Plowman RN,BSN,CCM RN Care Manager Coordinator [phone]  High Cholesterol  High cholesterol is a condition in which the blood has high levels of a white, waxy substance similar to fat (cholesterol). The liver makes all the cholesterol that the body needs. The human body needs small amounts of cholesterol to help build cells. A person gets extra or excess cholesterol from the food that he or she eats. The blood carries cholesterol from the liver to the rest  of the body. If you have high cholesterol, deposits (plaques) may build up on the walls of your arteries. Arteries are the blood vessels that carry blood away from your heart. These plaques make the arteries narrow and stiff. Cholesterol plaques increase your risk for heart attack and stroke. Work with your health care provider to keep your cholesterol levels in a healthy range. What increases the risk? The following factors may make you more likely to develop this condition: Eating foods that are high in animal fat (saturated fat) or cholesterol. Being overweight. Not getting enough exercise. A family history of high cholesterol (familial hypercholesterolemia). Use of tobacco products. Having diabetes. What are the signs or symptoms? In most cases, high cholesterol does not usually cause any symptoms. In severe cases, very high cholesterol levels can cause: Fatty bumps under the skin (xanthomas). A white or gray ring around the black center (pupil) of the eye. How is this diagnosed? This condition may be diagnosed based on the results of a blood test. If you are older than 75 years of age, your health care provider may check your cholesterol levels every 4-6 years. You may be checked more often if you have high cholesterol or other risk factors for heart disease. The blood test for cholesterol measures: "Bad" cholesterol, or LDL cholesterol. This is the main type of cholesterol that causes heart disease. The desired level is less than 100 mg/dL (2.59 mmol/L). "Good" cholesterol, or HDL cholesterol. HDL helps protect against heart disease by cleaning the arteries and carrying  the LDL to the liver for processing. The desired level for HDL is 60 mg/dL (1.55 mmol/L) or higher. Triglycerides. These are fats that your body can store or burn for energy. The desired level is less than 150 mg/dL (1.69 mmol/L). Total cholesterol. This measures the total amount of cholesterol in your blood and includes  LDL, HDL, and triglycerides. The desired level is less than 200 mg/dL (5.17 mmol/L). How is this treated? Treatment for high cholesterol starts with lifestyle changes, such as diet and exercise. Diet changes. You may be asked to eat foods that have more fiber and less saturated fats or added sugar. Lifestyle changes. These may include regular exercise, maintaining a healthy weight, and quitting use of tobacco products. Medicines. These are given when diet and lifestyle changes have not worked. You may be prescribed a statin medicine to help lower your cholesterol levels. Follow these instructions at home: Eating and drinking  Eat a healthy, balanced diet. This diet includes: Daily servings of a variety of fresh, frozen, or canned fruits and vegetables. Daily servings of whole grain foods that are rich in fiber. Foods that are low in saturated fats and trans fats. These include poultry and fish without skin, lean cuts of meat, and low-fat dairy products. A variety of fish, especially oily fish that contain omega-3 fatty acids. Aim to eat fish at least 2 times a week. Avoid foods and drinks that have added sugar. Use healthy cooking methods, such as roasting, grilling, broiling, baking, poaching, steaming, and stir-frying. Do not fry your food except for stir-frying. If you drink alcohol: Limit how much you have to: 0-1 drink a day for women who are not pregnant. 0-2 drinks a day for men. Know how much alcohol is in a drink. In the [place] one drink equals one 12 oz bottle of beer (355 mL), one 5 oz glass of wine (148 mL), or one 1 oz glass of hard liquor (44 mL). Lifestyle  Get regular exercise. Aim to exercise for a total of 150 minutes a week. Increase your activity level by doing activities such as gardening, walking, and taking the stairs. Do not use any products that contain nicotine or tobacco. These products include cigarettes, chewing tobacco, and vaping devices, such as e-cigarettes.  If you need help quitting, ask your health care provider. General instructions Take over-the-counter and prescription medicines only as told by your health care provider. Keep all follow-up visits. This is important. Where to find more information American Heart Association: www.heart.org National Heart, Lung, and Blood Institute: https://wilson-eaton.com/ Contact a health care provider if: You have trouble achieving or maintaining a healthy diet or weight. You are starting an exercise program. You are unable to stop smoking. Get help right away if: You have chest pain. You have trouble breathing. You have discomfort or pain in your jaw, neck, back, shoulder, or arm. You have any symptoms of a stroke. "BE FAST" is an easy way to remember the main warning signs of a stroke: B - Balance. Signs are dizziness, sudden trouble walking, or loss of balance. E - Eyes. Signs are trouble seeing or a sudden change in vision. F - Face. Signs are sudden weakness or numbness of the face, or the face or eyelid drooping on one side. A - Arms. Signs are weakness or numbness in an arm. This happens suddenly and usually on one side of the body. S - Speech. Signs are sudden trouble speaking, slurred speech, or trouble understanding what people say. T -  Time. Time to call emergency services. Write down what time symptoms started. You have other signs of a stroke, such as: A sudden, severe headache with no known cause. Nausea or vomiting. Seizure. These symptoms may represent a serious problem that is an emergency. Do not wait to see if the symptoms will go away. Get medical help right away. Call your local emergency services (911 in the [place] Do not drive yourself to the hospital. Summary Cholesterol plaques increase your risk for heart attack and stroke. Work with your health care provider to keep your cholesterol levels in a healthy range. Eat a healthy, balanced diet, get regular exercise, and maintain a healthy  weight. Do not use any products that contain nicotine or tobacco. These products include cigarettes, chewing tobacco, and vaping devices, such as e-cigarettes. Get help right away if you have any symptoms of a stroke. This information is not intended to replace advice given to you by your health care provider. Make sure you discuss any questions you have with your health care provider. Document Revised: 03/15/2020 Document Reviewed: 03/05/2020 Elsevier Patient Education  [place].

## 2021-09-11 ENCOUNTER — Telehealth: Payer: Self-pay | Admitting: Oncology

## 2021-09-11 NOTE — Telephone Encounter (Signed)
Called patient regarding upcoming September appointments, left a voicemail. 

## 2021-09-13 ENCOUNTER — Other Ambulatory Visit: Payer: Self-pay

## 2021-09-13 ENCOUNTER — Inpatient Hospital Stay: Payer: Medicare HMO | Attending: Oncology | Admitting: Oncology

## 2021-09-13 VITALS — BP 96/66 | HR 89 | Temp 97.6°F | Resp 17 | Ht 65.5 in | Wt 193.7 lb

## 2021-09-13 DIAGNOSIS — C44711 Basal cell carcinoma of skin of unspecified lower limb, including hip: Secondary | ICD-10-CM | POA: Insufficient documentation

## 2021-09-13 NOTE — Progress Notes (Signed)
Hematology and Oncology Follow Up Visit  Roy [name] [id number] 07-Jun-1945 76 y.o. 09/13/2021 1:14 PM Roy [name], Roy [name], MDBedsole, Roy E, MD   Principle Diagnosis: 76 year old with relapsed basal cell carcinoma of the left lower extremity noted in January 2023.  He initially  Prior therapy: Erivedge 150 mg daily started in March 2023.  Therapy was discontinued in June 2023.     Current therapy: He is currently on active surveillance.   Interim History: [name] is here for repeat evaluation.  Since last visit, he has been off Erivedge without any complications.  He reports cramps have resolved at this time.  He denies any skin rashes or lesions.  He denies any hospitalizations or illnesses.     Medications: Reviewed without changes. Current Outpatient Medications  Medication Sig Dispense Refill   atorvastatin (LIPITOR) 80 MG tablet Take 1 tablet (80 mg total) by mouth daily. 90 tablet 3   carvedilol (COREG) 12.5 MG tablet Take 1 tablet (12.5 mg total) by mouth 2 (two) times daily with a meal. Please make overdue appt with Dr. Johnsie Cancel for additional refills. FINAL ATTEMPT 15 tablet 0   feeding supplement (ENSURE IMMUNE HEALTH) LIQD Take 237 mLs by mouth in the morning.     fenofibrate (TRICOR) 145 MG tablet Take 145 mg by mouth daily.     fluocinonide (LIDEX) 0.05 % external solution Apply 1 application topically at bedtime as needed (psoriasis).     hydrocortisone cream 1 % Apply 1 application topically 2 (two) times daily as needed for itching.     nitroGLYCERIN (NITROSTAT) 0.4 MG SL tablet Place 1 tablet (0.4 mg total) under the tongue every 5 (five) minutes x 3 doses as needed for chest pain. 30 tablet 0   omeprazole (PRILOSEC) 20 MG capsule TAKE 1 CAPSULE BY MOUTH EVERY DAY 90 capsule 1   traZODone (DESYREL) 50 MG tablet TAKE 1/2 TO 1 TABLET BY MOUTH AT BEDTIME AS NEEDED FOR SLEEP 90 tablet 0   triamcinolone (KENALOG) 0.1 % Apply 1 application topically 3 (three) times daily  as needed (psoriasis).     venlafaxine XR (EFFEXOR-XR) 75 MG 24 hr capsule TAKE 1 CAPSULE BY MOUTH DAILY WITH BREAKFAST. 90 capsule 1   No current facility-administered medications for this visit.     Allergies: No Known Allergies    Physical Exam:    ECOG: 1    General appearance: Comfortable appearing without any discomfort Head: Normocephalic without any trauma Oropharynx: Mucous membranes are moist and pink without any thrush or ulcers. Eyes: Pupils are equal and round reactive to light. Lymph nodes: No cervical, supraclavicular, inguinal or axillary lymphadenopathy.   Heart:regular rate and rhythm.  S1 and S2 without leg edema. Lung: Clear without any rhonchi or wheezes.  No dullness to percussion. Abdomin: Soft, nontender, nondistended with good bowel sounds.  No hepatosplenomegaly. Musculoskeletal: No joint deformity or effusion.  Full range of motion noted. Neurological: No deficits noted on motor, sensory and deep tendon reflex exam. Skin: Left lower extremity lesions well-healed without any recurrence.    Lab Results: Lab Results  Component Value Date   WBC 8.7 06/12/2021   HGB 15.1 06/12/2021   HCT 42.8 06/12/2021   MCV 89.2 06/12/2021   PLT 209 06/12/2021   PSA 0.61 07/08/2016     Chemistry      Component Value Date/Time   NA 140 06/12/2021 1445   K 4.9 06/12/2021 1445   CL 108 06/12/2021 1445   CO2 24 06/12/2021 1445  BUN 21 06/12/2021 1445   CREATININE 1.55 (H) 06/12/2021 1445   CREATININE 1.11 05/10/2021 1534      Component Value Date/Time   CALCIUM 9.6 06/12/2021 1445   ALKPHOS 29 (L) 06/12/2021 1445   AST 22 06/12/2021 1445   ALT 22 06/12/2021 1445   BILITOT 0.7 06/12/2021 1445         Impression and Plan:   76 year old with:  1.  Basal cell carcinoma of the left lower extremity diagnosed in 2015.  He developed recurrent disease in 2023.  He is currently off systemic treatment given his excellent response as well as side  effects associated with Erivedge.  Risks and benefits of restarting this treatment versus continued surveillance were discussed.  At this time, I see no evidence of recurrent disease and I recommended to discontinue this treatment altogether.  This will be considered in the future if he has locally advanced disease that cannot be resected.   2.  GI toxicity and cramps: Related to Erivedge which has resolved at this time.   3.  Follow-up: I am happy to see him in the future as needed.  20  minutes were spent on this encounter.  The time was dedicated to reviewing his disease status, treatment choices and addressing complications related to his cancer and cancer therapy.    Zola Button, MD 9/1/20231:14 PM

## 2021-11-01 NOTE — Progress Notes (Signed)
Cardiology Office Note    Date:  11/05/2021   ID:  Ival, Pacer 1945-04-14, MRN [id number]  PCP:  Jinny Sanders, MD  Cardiologist:  Dr. Johnsie Cancel  Chief Complaint: hospital follow up  History of Present Illness:   Roy [name] is a 76 y.o. 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  Last Myoview in 2018 was nonischemic.  Admitted summer 2021 with large bowel obstruction with diverticulitis..  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  No chest pain Had colostomy reversal with Dr Kae Heller 03/07/20 with no cardiac complications and held plavix  Has some spinal stenosis by MR 03/15/20 seeing Dr Posey Pronto and getting PT  He has had relapse of left lower extremity basal cell carcinoma Rx with [employer] till June   Has some cramps with it but better now No angina   Past Medical History:  Diagnosis Date   Basal cell carcinoma    Bilateral Legs   CAD (coronary artery disease)    a.  stent to the RCA in 2001;  b.  Negative Myoview in January 2012;  c.  [place] 4/16:  dLM 20, small D1 50-70, D2 sub-total, oLCx 50-70, RCA stent ok, dPLB 90, EF 35-40% >> DES to D2   History of echocardiogram    a.  Echo 4/16: Mild LVH, EF 18-56%, grade 1 diastolic dysfunction, normal wall motion, MAC   HLD (hyperlipidemia)    HTN (hypertension)    MS (multiple sclerosis) (HCC)    OSA (obstructive sleep apnea)     no cpap    Pre-diabetes    Vertigo     Past Surgical History:  Procedure Laterality Date   BIOPSY  07/26/2019   Procedure: BIOPSY;  Surgeon: Gatha Mayer, MD;  Location: [place];  Service: Endoscopy;;   CATARACT EXTRACTION, BILATERAL     COLON RESECTION SIGMOID N/A 08/30/2019   Procedure: SIGMOID COLON RESECTION;   Surgeon: Clovis Riley, MD;  Location: [place];  Service: General;  Laterality: N/A;   COLONOSCOPY N/A 07/26/2019   Procedure: COLONOSCOPY;  Surgeon: Gatha Mayer, MD;  Location[facility] ENDOSCOPY;  Service: Endoscopy;  Laterality: N/A;   COLONOSCOPY     COLOSTOMY N/A 08/30/2019   Procedure: COLOSTOMY;  Surgeon: Clovis Riley, MD;  Location: [place];  Service: General;  Laterality: N/A;   COLOSTOMY TAKEDOWN N/A 03/07/2020   Procedure: LAPAROSCOPIC COLOSTOMY REVERSAL, RIGID PROCTOSCOPY;  Surgeon: Clovis Riley, MD;  Location: [facility];  Service: General;  Laterality: N/A;   CORONARY STENT PLACEMENT  2001    RCA   INCISIONAL HERNIA REPAIR N/A 09/24/2020   Procedure: LAPAROSCOPIC INCISIONAL HERNIA REPAIR WITH MESH;  Surgeon: Clovis Riley, MD;  Location: [facility];  Service: General;  Laterality: N/A;   LEFT HEART CATHETERIZATION WITH CORONARY ANGIOGRAM N/A 02/13/2012   Procedure: LEFT HEART CATHETERIZATION WITH CORONARY ANGIOGRAM;  Surgeon: Aoki Wedemeyer M Martinique, MD;  Location[facility] C[facility] LAB;  Service: Cardiovascular;  Laterality: N/A;   LEFT HEART CATHETERIZATION WITH CORONARY ANGIOGRAM N/A 04/24/2014   Procedure: LEFT HEART CATHETERIZATION WITH CORONARY ANGIOGRAM;  Surgeon: [name], MD;  Location: [facility] CATH LAB;  Service: Cardiovascular;  Laterality: N/A;   LYSIS OF ADHESION N/A 03/07/2020   Procedure:  LYSIS OF ADHESION;  Surgeon: Clovis Riley, MD;  Location: [facility];  Service: General;  Laterality: N/A;   TONSILLECTOMY      Current Medications: Prior to Admission medications   Medication Sig Start Date End Date Taking? Authorizing Provider  acetaminophen (TYLENOL) 325 MG tablet Take 2 tablets (650 mg total) by mouth every 6 (six) hours as needed. 09/06/19   Maczis, Barth Kirks, PA-C  carvedilol (COREG) 12.5 MG tablet TAKE 1 TABLET BY MOUTH TWICE A DAY 09/15/19   Josue Hector, MD  clopidogrel (PLAVIX) 75 MG tablet TAKE 1 TABLET BY MOUTH EVERY DAY Patient taking differently: Take 75 mg by  mouth daily.  10/12/18   Josue Hector, MD  dicyclomine (BENTYL) 20 MG tablet Take 1 tablet (20 mg total) by mouth every 6 (six) hours as needed (abdominal pain). 07/28/19   Aline August, MD  feeding supplement, ENSURE ENLIVE, (ENSURE ENLIVE) LIQD Take 237 mLs by mouth 2 (two) times daily between meals. 09/06/19   Darliss Cheney, MD  loperamide (IMODIUM) 2 MG capsule Take 1 capsule (2 mg total) by mouth 2 (two) times daily. 09/06/19   Maczis, Barth Kirks, PA-C  Magnesium Oxide 400 MG CAPS Take 1 capsule (400 mg total) by mouth daily. 08/15/19   Shelly Coss, MD  mesalamine (LIALDA) 1.2 g EC tablet Take 4 tablets (4.8 g total) by mouth daily with breakfast. 08/16/19   Shelly Coss, MD  nitroGLYCERIN (NITROSTAT) 0.4 MG SL tablet Place 1 tablet (0.4 mg total) under the tongue every 5 (five) minutes x 3 doses as needed for chest pain. 07/28/19   Aline August, MD  Nutritional Supplements (,FEEDING SUPPLEMENT, PROSOURCE PLUS) liquid Take 30 mLs by mouth at bedtime. 09/06/19 10/06/19  Darliss Cheney, MD  omeprazole (PRILOSEC) 20 MG capsule TAKE 1 CAPSULE BY MOUTH EVERY DAY 09/15/19   Josue Hector, MD  ondansetron (ZOFRAN) 4 MG tablet Take 1 tablet (4 mg total) by mouth every 6 (six) hours as needed for nausea. 09/06/19   Maczis, Barth Kirks, PA-C  oxyCODONE (OXY IR/ROXICODONE) 5 MG immediate release tablet Take 1 tablet (5 mg total) by mouth every 6 (six) hours as needed for severe pain or breakthrough pain. 09/06/19   Maczis, Barth Kirks, PA-C  potassium chloride 20 MEQ TBCR Take 20 mEq by mouth daily. Take 2 pills (40 mEq) for a week then continue taking 20 mEq daily.Check potassium  level in a week Patient taking differently: Take 20 mEq by mouth daily. 20 mEq, Oral, Daily, Take 2 pills (40 mEq) for a week then continue taking 20 mEq daily.Check potassium  level in a week 08/15/19   Shelly Coss, MD  venlafaxine XR (EFFEXOR-XR) 37.5 MG 24 hr capsule TAKE IN ADDITION TO 75 MG TABLETS 09/20/19   [name], [name], MD   venlafaxine XR (EFFEXOR-XR) 75 MG 24 hr capsule TAKE 1 CAPSULE (75 MG TOTAL) BY MOUTH DAILY WITH BREAKFAST. 06/20/19   [name], [name], MD    Allergies:   Patient has no known allergies.   Social History   Socioeconomic History   Marital status: Divorced    Spouse name: Not on file   Number of children: Not on file   Years of education: Not on file   Highest education level: Not on file  Occupational History   Not on file  Tobacco Use   Smoking status: Former    Packs/day: 1.00    Years: 25.00    Total pack years: 25.00  Types: Cigarettes    Quit date: 01/14/2000    Years since quitting: 21.8   Smokeless tobacco: Never  Vaping Use   Vaping Use: Never used  Substance and Sexual Activity   Alcohol use: Yes    Alcohol/week: 3.0 standard drinks of alcohol    Types: 1 Cans of beer, 2 Shots of liquor per week    Comment: 4-5 drinks per week    Drug use: No   Sexual activity: Not Currently  Other Topics Concern   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   Right handed   Drinks caffeine   Single story home   Social Determinants of Health   Financial Resource Strain: Low Risk  (05/02/2021)   Overall Financial Resource Strain (CARDIA)    Difficulty of Paying Living Expenses: Not hard at all  Food Insecurity: No Food Insecurity (05/02/2021)   Hunger Vital Sign    Worried About Running Out of Food in the Last Year: Never true    Ran Out of Food in the Last Year: Never true  Transportation Needs: No Transportation Needs (05/02/2021)   PRAPARE - Hydrologist (Medical): No    Lack of Transportation (Non-Medical): No  Physical Activity: Inactive (05/02/2021)   Exercise Vital Sign    Days of Exercise per Week: 0 days    Minutes of Exercise per Session: 0 min  Stress: No Stress Concern Present (05/02/2021)   [place]    Feeling of Stress : Only a little  Social Connections: Socially Isolated (05/02/2021)   Social Connection and Isolation Panel [NHANES]    Frequency of Communication with Friends and Family: More than three times a week    Frequency of Social Gatherings with Friends and Family: Twice a week    Attends Religious Services: Never    Marine scientist or Organizations: No    Attends Music therapist: Never    Marital Status: Divorced     Family History:  The patient's family history includes Alcohol abuse in his mother; Cirrhosis in his mother; Coronary artery disease in his father; Depression in his sister; Heart disease in his father; Hypertension in his father; Pancreatic cancer in his mother; Peripheral vascular disease in his father.   ROS:   Please see the history of present illness.    ROS All other systems reviewed and are negative.   PHYSICAL EXAM:   VS:  BP 124/86   Pulse 76   Ht _0  (1.676 m)   Wt 195 lb 6.4 oz (88.6 kg)   SpO2 97%   BMI 31.54 kg/m    Affect appropriate Healthy:  appears stated age 50: normal Neck supple with no adenopathy JVP normal no bruits no thyromegaly Lungs clear with no wheezing and good diaphragmatic motion Heart:  S1/S2 no murmur, no rub, gallop or click PMI normal Abdomen: benighn, BS positve, no tenderness, no AAA no bruit.  No HSM or HJR Distal pulses intact with no bruits No edema Neuro non-focal LLE healed skin cancer still with some basal cells spots No muscular weakness   Wt Readings from Last 3 Encounters:  11/05/21 195 lb 6.4 oz (88.6 kg)  09/13/21 193 lb 11.2 oz (87.9 kg)  08/15/21 191 lb 8 oz (86.9 kg)      Studies/Labs  Reviewed:   EKG:  SR rate 86 Q lead 2 08/28/19 11/05/21 SR rate 76 ? Old IMI no acute changes   Recent Labs: 06/12/2021: ALT 22; BUN 21; Creatinine 1.55; Hemoglobin 15.1; Platelet Count 209; Potassium 4.9; Sodium 140   Lipid Panel     Component Value Date/Time   CHOL 305 (H) 06/13/2021 1208   CHOL 174 03/26/2016 0736   TRIG (H) 06/13/2021 1208    472.0 Triglyceride is over 400; calculations on Lipids are invalid.   TRIG 130 12/08/2005 0741   HDL 59.90 06/13/2021 1208   HDL 40 03/26/2016 0736   CHOLHDL 5 06/13/2021 1208   VLDL 59.0 (H) 11/08/2018 0904   LDLCALC  05/10/2021 1534     Comment:     . LDL cholesterol not calculated. Triglyceride levels greater than 400 mg/dL invalidate calculated LDL results. . Reference range: <100 . Desirable range <100 mg/dL for primary prevention;   <70 mg/dL for patients with CHD or diabetic patients  with > or = 2 CHD risk factors. Marland Kitchen LDL-C is now calculated using the Martin-Hopkins  calculation, which is a validated novel method providing  better accuracy than the Friedewald equation in the  estimation of LDL-C.  Cresenciano Genre et al. Annamaria Helling. 3846;659(93): 2061-2068  (http://education.[employer].com/faq/FAQ164)    LDLDIRECT 171.0 06/13/2021 1208    Additional studies/ records that were reviewed today include:   Echocardiogram: 07/2019  1. Left ventricular ejection fraction, by estimation, is 70 to 75%. The  left ventricle has hyperdynamic function. The left ventricle has no  regional wall motion abnormalities. Left ventricular diastolic parameters  are consistent with Grade I diastolic  dysfunction (impaired relaxation).   2. Right ventricular systolic function is normal. The right ventricular  size is normal. There is normal pulmonary artery systolic pressure.   3. The mitral valve is normal in structure. Trivial mitral valve  regurgitation. No evidence of mitral stenosis.   4. The aortic valve is tricuspid. Aortic valve regurgitation is not  visualized. No aortic stenosis is present.   5. The inferior vena cava is normal in size with greater than 50%  respiratory variability, suggesting right atrial pressure of 3 mmHg.      ASSESSMENT & PLAN:    CAD  - no  angina continue beta blocker and plavix   2. HLD - supposed to be on statin no recent labs in Epic  F/u primary   3. s/p colectomy with colostomy and reversal  -  03/07/20 f/u Dr Kae Heller improved   4. HTN - Well controlled.  Continue current medications and low sodium Dash type diet.    5.  Basal cell :  LLE post Rx on observation now continue f/u oncology   F/U in a year   Signed, Jenkins Rouge, MD  11/05/2021 4:13 PM    [facility] [address] [place], [place], [place]  [address] Phone: [phone]; Fax: [phone]

## 2021-11-04 ENCOUNTER — Other Ambulatory Visit: Payer: Self-pay | Admitting: Cardiovascular Disease

## 2021-11-04 ENCOUNTER — Other Ambulatory Visit: Payer: Self-pay | Admitting: Family Medicine

## 2021-11-05 ENCOUNTER — Ambulatory Visit: Payer: Medicare HMO | Attending: Cardiovascular Disease | Admitting: Cardiovascular Disease

## 2021-11-05 VITALS — BP 124/86 | HR 76 | Ht 66.0 in | Wt 195.4 lb

## 2021-11-05 DIAGNOSIS — F17200 Nicotine dependence, unspecified, uncomplicated: Secondary | ICD-10-CM | POA: Insufficient documentation

## 2021-11-05 DIAGNOSIS — I2510 Atherosclerotic heart disease of native coronary artery without angina pectoris: Secondary | ICD-10-CM | POA: Diagnosis not present

## 2021-11-05 DIAGNOSIS — E782 Mixed hyperlipidemia: Secondary | ICD-10-CM

## 2021-11-05 DIAGNOSIS — I10 Essential (primary) hypertension: Secondary | ICD-10-CM | POA: Diagnosis not present

## 2021-11-05 MED ORDER — BUPROPION HCL ER (SR) 150 MG PO TB12: 150.0000 mg | ORAL_TABLET | Freq: Every day | ORAL | 3 refills | Status: DC

## 2021-11-05 NOTE — Patient Instructions (Addendum)
Medication Instructions:  Your physician recommends that you continue on your current medications as directed. Please refer to the Current Medication list given to you today.   Lab Work: If you have labs (blood work) drawn today and your tests are completely normal, you will receive your results only by: [place] (if you have MyChart) OR A paper copy in the mail If you have any lab test that is abnormal or we need to change your treatment, we will call you to review the results.  Follow-Up: At [facility], you and your health needs are our priority.  As part of our continuing mission to provide you with exceptional heart care, we have created designated Provider Care Teams.  These Care Teams include your primary Cardiologist (physician) and Advanced Practice Providers (APPs -  Physician Assistants and Nurse Practitioners) who all work together to provide you with the care you need, when you need it.  We recommend signing up for the patient portal called "MyChart".  Sign up information is provided on this After Visit Summary.  MyChart is used to connect with patients for Virtual Visits (Telemedicine).  Patients are able to view lab/test results, encounter notes, upcoming appointments, etc.  Non-urgent messages can be sent to your provider as well.   To learn more about what you can do with MyChart, go to [email or web address].    Your next appointment:   12 month(s)  The format for your next appointment:   In Person  Provider:   Jenkins Rouge, MD      Important Information About Sugar

## 2021-11-07 ENCOUNTER — Ambulatory Visit: Payer: Self-pay

## 2021-11-07 NOTE — Patient Outreach (Signed)
  Care Coordination   Follow Up Visit Note   11/07/2021 Name: Roy [name] MRN: [id number] DOB: 08-23-1945  Roy [name] is a 76 y.o. year old male who sees Jinny Sanders, MD for primary care. I spoke with  [name] by phone today.  What matters to the patients health and wellness today?  Patient states he is doing well. He states provider added an additional cholesterol medication.  Patient states he is attempting to make some changes with diet but still needs to work on it.  He states he is not formally exercises.   Patient states he feels well overall. He reports having recent follow up with cardiologist on 11/05/21 and next follow up with primary care provider is February 2024. Goals reviewed. Patient in agreement that goals have been met and therefore will be closed to care coordination services.  Patient advised to call RNCM or primary care provider if care coordination services needed in the future.     Goals Addressed             This Visit's Progress    COMPLETED: Care coordination activites - no follow up needed.       Care Coordination Interventions: Reviewed importance of limiting foods high in cholesterol Reviewed exercise goals and target of 150 minutes per week Reviewed medications and importance of compliances Reviewed scheduled/ upcoming provider visits          SDOH assessments and interventions completed:  No     Care Coordination Interventions Activated:  Yes  Care Coordination Interventions:  Yes, provided   Follow up plan: No further follow needed.  Encounter Outcome:  Pt. Visit Completed   Quinn Plowman RN,BSN,CCM [place] [phone] direct line

## 2021-11-20 ENCOUNTER — Encounter: Payer: Self-pay | Admitting: Family Medicine

## 2021-11-20 ENCOUNTER — Telehealth: Payer: Self-pay | Admitting: Family Medicine

## 2021-11-20 ENCOUNTER — Ambulatory Visit (INDEPENDENT_AMBULATORY_CARE_PROVIDER_SITE_OTHER): Payer: Medicare HMO | Admitting: Family Medicine

## 2021-11-20 VITALS — BP 124/78 | HR 87 | Temp 97.3°F | Ht 66.0 in | Wt 194.8 lb

## 2021-11-20 DIAGNOSIS — E1159 Type 2 diabetes mellitus with other circulatory complications: Secondary | ICD-10-CM | POA: Diagnosis not present

## 2021-11-20 DIAGNOSIS — J209 Acute bronchitis, unspecified: Secondary | ICD-10-CM

## 2021-11-20 MED ORDER — PREDNISONE 20 MG PO TABS: ORAL_TABLET | ORAL | 0 refills | Status: DC

## 2021-11-20 MED ORDER — GUAIFENESIN-CODEINE 100-10 MG/5ML PO SYRP: 5.0000 mL | ORAL_SOLUTION | Freq: Two times a day (BID) | ORAL | 0 refills | Status: DC | PRN

## 2021-11-20 NOTE — Telephone Encounter (Signed)
Noted  

## 2021-11-20 NOTE — Telephone Encounter (Signed)
If he needs something like a cough suppressant I would be happy to send that in.  I will not send an antibiotic and without an office visit.  Please triage.

## 2021-11-20 NOTE — Telephone Encounter (Signed)
Patient called in and stated he was unable to take a covid test due to them been expired. He stated he will be here still for his appointment.

## 2021-11-20 NOTE — Patient Instructions (Addendum)
I think you have acute bronchitis. Start cough syrup with codeine.  Take prednisone '40mg'$  daily for 3 days followed by '20mg'$  daily for 3 days. Limit sugars and carbs while on it as it will raise sugar levels.  Push fluids and plenty of rest.  Watch for fever >101, worsening productive cough, worsening shortness of breath, let us know if this happens.   I want you to reach out to heart doctor's office about plavix.

## 2021-11-20 NOTE — Assessment & Plan Note (Signed)
Anticipate viral bronchitis, discussed with patient. Rx cheratussin with sedation precautions Rx 6d prednisone taper with hyperglycemia precautions  Update if not improving with treatment.

## 2021-11-20 NOTE — Telephone Encounter (Addendum)
I spoke with pt; pt has been taking robitussin and mucinex with no improvement of symptoms. For 2 wks Pt has no fever, pt has head and chest congestion but does not have mucus when blows nose and has dry cough. Pt has wheezing at nighttime. Pt has not cked covid test but has home covid test and will ck for covid now and will cb with results. Pt already has appt to come to [facility] for appt 11/20/21 at 4 PM to see Dr Darnell Level. Sending note to Dr Diona Browner as PCP, Dr Darnell Level and Lattie Haw CMA.

## 2021-11-20 NOTE — Progress Notes (Signed)
Patient ID: Roy [name], male    DOB: September 30, 1945, 76 y.o.   MRN: [id number]  This visit was conducted in person.  BP 124/78   Pulse 87   Temp (!) 97.3 F (36.3 C) (Temporal)   Ht '5\' 6"'$  (1.676 m)   Wt 194 lb 12.8 oz (88.4 kg)   SpO2 95%   BMI 31.44 kg/m    CC: cough Subjective:   HPI: Roy [name] is a 76 y.o. male presenting on 11/20/2021 for Cough (C/o dry cough, head/chest congestion and wheezing- at night.  Denies fever, SOB or body aches.  Sxs started 2 wks ago. )   Roy [name]  has a past medical history of Basal cell carcinoma, CAD (coronary artery disease), History of echocardiogram, HLD (hyperlipidemia), HTN (hypertension), MS (multiple sclerosis) ([place]), OSA (obstructive sleep apnea), Pre-diabetes, and Vertigo.  2 wk h/o cough, head congestion > chest congestion and wheezing worse at night time. Chest rattling. Having coughing fits. Unable to cough anything up.   H/o OSA not on CPAP H/o MS 1968 - no current issue.   No fevers/chills, dyspnea, body aches, ear or tooth pain, ST, PNdrainage.  Tried robitussin DM, mucinex, flonase nasal spray.   No sick contacts at home.  No h/o asthma, COPD, smoking, allergies.  GERD managed with omeprazole daily.   H/o diet controlled DM  Lab Results  Component Value Date   HGBA1C 6.6 (A) 08/15/2021        Relevant past medical, surgical, family and social history reviewed and updated as indicated. Interim medical history since our last visit reviewed. Allergies and medications reviewed and updated. Outpatient Medications Prior to Visit  Medication Sig Dispense Refill   atorvastatin (LIPITOR) 80 MG tablet Take 1 tablet (80 mg total) by mouth daily. 90 tablet 3   carvedilol (COREG) 12.5 MG tablet Take 1 tablet (12.5 mg total) by mouth 2 (two) times daily with a meal. Please make overdue appt with Dr. Johnsie Cancel for additional refills. FINAL ATTEMPT 15 tablet 0   feeding supplement (ENSURE IMMUNE HEALTH) LIQD Take 237 mLs by  mouth in the morning.     fenofibrate (TRICOR) 145 MG tablet Take 145 mg by mouth daily.     fluocinonide (LIDEX) 0.05 % external solution Apply 1 application topically at bedtime as needed (psoriasis).     hydrocortisone cream 1 % Apply 1 application topically 2 (two) times daily as needed for itching.     nitroGLYCERIN (NITROSTAT) 0.4 MG SL tablet Place 1 tablet (0.4 mg total) under the tongue every 5 (five) minutes x 3 doses as needed for chest pain. 30 tablet 0   omeprazole (PRILOSEC) 20 MG capsule TAKE 1 CAPSULE BY MOUTH EVERY DAY 90 capsule 1   traZODone (DESYREL) 50 MG tablet TAKE 1/2 TO 1 TABLET BY MOUTH AT BEDTIME AS NEEDED FOR SLEEP 90 tablet 0   triamcinolone (KENALOG) 0.1 % Apply 1 application topically 3 (three) times daily as needed (psoriasis).     venlafaxine XR (EFFEXOR-XR) 75 MG 24 hr capsule Take 75 mg by mouth daily.     No facility-administered medications prior to visit.     Per HPI unless specifically indicated in ROS section below Review of Systems  Objective:  BP 124/78   Pulse 87   Temp (!) 97.3 F (36.3 C) (Temporal)   Ht '5\' 6"'$  (1.676 m)   Wt 194 lb 12.8 oz (88.4 kg)   SpO2 95%   BMI 31.44 kg/m   Wt Readings  from Last 3 Encounters:  11/20/21 194 lb 12.8 oz (88.4 kg)  11/05/21 195 lb 6.4 oz (88.6 kg)  09/13/21 193 lb 11.2 oz (87.9 kg)      Physical Exam Vitals and nursing note reviewed.  Constitutional:      Appearance: Normal appearance. He is not ill-appearing.  HENT:     Head: Normocephalic and atraumatic.     Right Ear: Hearing, tympanic membrane, ear canal and external ear normal. There is no impacted cerumen.     Left Ear: Hearing, tympanic membrane, ear canal and external ear normal. There is no impacted cerumen.     Nose: Nose normal. No mucosal edema, congestion or rhinorrhea.     Right Turbinates: Not enlarged or swollen.     Left Turbinates: Not enlarged or swollen.     Right Sinus: No maxillary sinus tenderness or frontal sinus  tenderness.     Left Sinus: No maxillary sinus tenderness or frontal sinus tenderness.     Mouth/Throat:     Mouth: Mucous membranes are moist.     Pharynx: Oropharynx is clear. No oropharyngeal exudate or posterior oropharyngeal erythema.  Eyes:     Extraocular Movements: Extraocular movements intact.     Conjunctiva/sclera: Conjunctivae normal.     Pupils: Pupils are equal, round, and reactive to light.  Cardiovascular:     Rate and Rhythm: Normal rate and regular rhythm.     Pulses: Normal pulses.     Heart sounds: Normal heart sounds. No murmur heard. Pulmonary:     Effort: Pulmonary effort is normal. No respiratory distress.     Breath sounds: Normal breath sounds. No wheezing, rhonchi or rales.     Comments: Persistent cough present Musculoskeletal:     Cervical back: Normal range of motion and neck supple. No rigidity.     Right lower leg: No edema.     Left lower leg: No edema.  Lymphadenopathy:     Cervical: No cervical adenopathy.  Skin:    General: Skin is warm and dry.     Findings: No rash.  Neurological:     Mental Status: He is alert.  Psychiatric:        Mood and Affect: Mood normal.        Behavior: Behavior normal.       Lab Results  Component Value Date   CREATININE 1.55 (H) 06/12/2021   BUN 21 06/12/2021   NA 140 06/12/2021   K 4.9 06/12/2021   CL 108 06/12/2021   CO2 24 06/12/2021   Assessment & Plan:   Problem List Items Addressed This Visit     Controlled type 2 diabetes mellitus with circulatory disorder ([place])    Discussed steroid precautions, rec limit sugar/carbs while on prednisone taper for bronchitis.       Acute bronchitis - Primary    Anticipate viral bronchitis, discussed with patient. Rx cheratussin with sedation precautions Rx 6d prednisone taper with hyperglycemia precautions  Update if not improving with treatment.        Meds ordered this encounter  Medications   guaiFENesin-codeine (ROBITUSSIN AC) 100-10 MG/5ML syrup     Sig: Take 5 mLs by mouth 2 (two) times daily as needed for cough (sedation precautions).    Dispense:  120 mL    Refill:  0   predniSONE (DELTASONE) 20 MG tablet    Sig: Take two tablets daily for 3 days followed by one tablet daily for 3 days    Dispense:  9 tablet  Refill:  0   No orders of the defined types were placed in this encounter.    Patient Instructions  I think you have acute bronchitis. Start cough syrup with codeine.  Take prednisone '40mg'$  daily for 3 days followed by '20mg'$  daily for 3 days. Limit sugars and carbs while on it as it will raise sugar levels.  Push fluids and plenty of rest.  Watch for fever >101, worsening productive cough, worsening shortness of breath, let us know if this happens.   I want you to reach out to heart doctor's office about plavix.   Follow up plan: Return if symptoms worsen or fail to improve.  Ria Bush, MD

## 2021-11-20 NOTE — Telephone Encounter (Signed)
Patient called in and was wanting to know if Dr. Diona Browner could send in something for his congestion and coughing but no fever. Informed patient he will need to be seen but he wanted to see if she could send it in without scheduling anything. Please advise. Thank you!

## 2021-11-20 NOTE — Assessment & Plan Note (Signed)
Discussed steroid precautions, rec limit sugar/carbs while on prednisone taper for bronchitis.

## 2021-11-22 ENCOUNTER — Encounter: Payer: Self-pay | Admitting: Cardiovascular Disease

## 2021-11-22 ENCOUNTER — Encounter: Payer: Self-pay | Admitting: Family Medicine

## 2021-12-10 DIAGNOSIS — I10 Essential (primary) hypertension: Secondary | ICD-10-CM | POA: Diagnosis not present

## 2021-12-10 DIAGNOSIS — Z85828 Personal history of other malignant neoplasm of skin: Secondary | ICD-10-CM | POA: Diagnosis not present

## 2021-12-10 DIAGNOSIS — R69 Illness, unspecified: Secondary | ICD-10-CM | POA: Diagnosis not present

## 2021-12-10 DIAGNOSIS — E785 Hyperlipidemia, unspecified: Secondary | ICD-10-CM | POA: Diagnosis not present

## 2021-12-10 DIAGNOSIS — K219 Gastro-esophageal reflux disease without esophagitis: Secondary | ICD-10-CM | POA: Diagnosis not present

## 2021-12-10 DIAGNOSIS — Z811 Family history of alcohol abuse and dependence: Secondary | ICD-10-CM | POA: Diagnosis not present

## 2021-12-10 DIAGNOSIS — E669 Obesity, unspecified: Secondary | ICD-10-CM | POA: Diagnosis not present

## 2021-12-10 DIAGNOSIS — L409 Psoriasis, unspecified: Secondary | ICD-10-CM | POA: Diagnosis not present

## 2021-12-10 DIAGNOSIS — R32 Unspecified urinary incontinence: Secondary | ICD-10-CM | POA: Diagnosis not present

## 2021-12-10 DIAGNOSIS — I739 Peripheral vascular disease, unspecified: Secondary | ICD-10-CM | POA: Diagnosis not present

## 2021-12-10 DIAGNOSIS — Z8249 Family history of ischemic heart disease and other diseases of the circulatory system: Secondary | ICD-10-CM | POA: Diagnosis not present

## 2021-12-18 ENCOUNTER — Other Ambulatory Visit: Payer: Self-pay | Admitting: Cardiovascular Disease

## 2022-01-14 DIAGNOSIS — C44719 Basal cell carcinoma of skin of left lower limb, including hip: Secondary | ICD-10-CM | POA: Diagnosis not present

## 2022-01-14 DIAGNOSIS — L853 Xerosis cutis: Secondary | ICD-10-CM | POA: Diagnosis not present

## 2022-01-14 DIAGNOSIS — Z85828 Personal history of other malignant neoplasm of skin: Secondary | ICD-10-CM | POA: Diagnosis not present

## 2022-01-14 DIAGNOSIS — C44712 Basal cell carcinoma of skin of right lower limb, including hip: Secondary | ICD-10-CM | POA: Diagnosis not present

## 2022-01-14 DIAGNOSIS — L821 Other seborrheic keratosis: Secondary | ICD-10-CM | POA: Diagnosis not present

## 2022-01-30 ENCOUNTER — Telehealth: Payer: Self-pay | Admitting: Family Medicine

## 2022-01-30 DIAGNOSIS — E1159 Type 2 diabetes mellitus with other circulatory complications: Secondary | ICD-10-CM

## 2022-01-30 NOTE — Telephone Encounter (Signed)
-----  Message from Velna Hatchet, RT sent at 01/28/2022 10:19 AM EST ----- Regarding: Tue 1/30 lab Future lab orders needed for a 6 month follow up appt, please.  Thanks, Anda Kraft

## 2022-02-11 ENCOUNTER — Other Ambulatory Visit (INDEPENDENT_AMBULATORY_CARE_PROVIDER_SITE_OTHER): Payer: Medicare HMO

## 2022-02-11 DIAGNOSIS — Z6830 Body mass index (BMI) 30.0-30.9, adult: Secondary | ICD-10-CM | POA: Diagnosis not present

## 2022-02-11 DIAGNOSIS — E1159 Type 2 diabetes mellitus with other circulatory complications: Secondary | ICD-10-CM

## 2022-02-11 DIAGNOSIS — H6123 Impacted cerumen, bilateral: Secondary | ICD-10-CM | POA: Diagnosis not present

## 2022-02-11 DIAGNOSIS — I10 Essential (primary) hypertension: Secondary | ICD-10-CM | POA: Diagnosis not present

## 2022-02-11 LAB — COMPREHENSIVE METABOLIC PANEL
ALT: 22 U/L (ref 0–53)
AST: 18 U/L (ref 0–37)
Albumin: 4.2 g/dL (ref 3.5–5.2)
Alkaline Phosphatase: 37 U/L — ABNORMAL LOW (ref 39–117)
BUN: 18 mg/dL (ref 6–23)
CO2: 27 mEq/L (ref 19–32)
Calcium: 9.6 mg/dL (ref 8.4–10.5)
Chloride: 104 mEq/L (ref 96–112)
Creatinine, Ser: 1.36 mg/dL (ref 0.40–1.50)
GFR: 50.73 mL/min — ABNORMAL LOW (ref >60.00)
Glucose, Bld: 180 mg/dL — ABNORMAL HIGH (ref 70–99)
Potassium: 4.5 mEq/L (ref 3.5–5.1)
Sodium: 140 mEq/L (ref 135–145)
Total Bilirubin: 0.3 mg/dL (ref 0.2–1.2)
Total Protein: 6.8 g/dL (ref 6.0–8.3)

## 2022-02-11 LAB — LDL CHOLESTEROL, DIRECT: Direct LDL: 84 mg/dL

## 2022-02-11 LAB — LIPID PANEL
Cholesterol: 182 mg/dL (ref 0–200)
HDL: 42.6 mg/dL (ref >39.00)
Total CHOL/HDL Ratio: 4
Triglycerides: 550 mg/dL — ABNORMAL HIGH (ref 0.0–149.0)

## 2022-02-11 LAB — HEMOGLOBIN A1C: Hgb A1c MFr Bld: 7 % — ABNORMAL HIGH (ref 4.6–6.5)

## 2022-02-11 NOTE — Progress Notes (Signed)
No critical labs need to be addressed urgently. We will discuss labs in detail at upcoming office visit.   

## 2022-02-18 ENCOUNTER — Ambulatory Visit (INDEPENDENT_AMBULATORY_CARE_PROVIDER_SITE_OTHER): Payer: Medicare HMO | Admitting: Family Medicine

## 2022-02-18 ENCOUNTER — Encounter: Payer: Self-pay | Admitting: Family Medicine

## 2022-02-18 VITALS — BP 106/70 | HR 102 | Temp 97.6°F | Ht 66.0 in | Wt 199.2 lb

## 2022-02-18 DIAGNOSIS — E1159 Type 2 diabetes mellitus with other circulatory complications: Secondary | ICD-10-CM | POA: Diagnosis not present

## 2022-02-18 DIAGNOSIS — I152 Hypertension secondary to endocrine disorders: Secondary | ICD-10-CM | POA: Diagnosis not present

## 2022-02-18 DIAGNOSIS — E785 Hyperlipidemia, unspecified: Secondary | ICD-10-CM | POA: Diagnosis not present

## 2022-02-18 DIAGNOSIS — E1169 Type 2 diabetes mellitus with other specified complication: Secondary | ICD-10-CM

## 2022-02-18 NOTE — Assessment & Plan Note (Signed)
Inadequate control despite compliance with atorvastatin 80 mg daily and fenofibrate 145 mg daily.  He is not compliant with diet and exercise.  We spent considerable time discussing improvements.  Will recheck this in 3 months.

## 2022-02-18 NOTE — Assessment & Plan Note (Addendum)
Chronic, likely worsened due to recent prednisone use and the holidays. He will get back on track with low carbohydrate diet and increased exercise and weight management.  He would be a good candidate for a GLP-1 medication if not at goal at next check in 3 months

## 2022-02-18 NOTE — Progress Notes (Signed)
Patient ID: Roy [name], male    DOB: [birth year], 77 y.o.   MRN: [id number]  This visit was conducted in person.  BP 106/70   Pulse (!) 102   Temp 97.6 F (36.4 C) (Temporal)   Ht '5\' 6"'$  (1.676 m)   Wt 199 lb 4 oz (90.4 kg)   SpO2 96%   BMI 32.16 kg/m    CC:  Chief Complaint  Patient presents with   Diabetes   Hyperlipidemia    Subjective:   HPI: Roy [name] is a 77 y.o. male presenting on 02/18/2022 for Diabetes and Hyperlipidemia Reviewed last office visit note from cardiology Dr. Roosvelt Harps from November 05, 2021 Diabetes: Slightly worsened control on no medication... He was on steroids in November for bronchitis Lab Results  Component Value Date   HGBA1C 7.0 (H) 02/11/2022  Using medications without difficulties: Hypoglycemic episodes: Hyperglycemic episodes: Feet problems: none Blood Sugars averaging: not chking eye exam within last year: yes  Hypertension:   Well-controlled on Coreg 12.5 mg p.o. twice daily BP Readings from Last 3 Encounters:  02/18/22 106/70  11/20/21 124/78  11/05/21 124/86  Using medication without problems or lightheadedness:  none Chest pain with exertion:none Edema:none Short of breath:none Average home BP nones: Other issues:  Wt Readings from Last 3 Encounters:  02/18/22 199 lb 4 oz (90.4 kg)  11/20/21 194 lb 12.8 oz (88.4 kg)  11/05/21 195 lb 6.4 oz (88.6 kg)     Elevated Cholesterol: Triglycerides very elevated despite fenofibrate 145 mg daily and atorvastatin 80 mg daily LDL goal < 70 given  CAD... LDL has improved significantly from 8 months ago at 171 now down to 84 History of stenting to RCA in 2001. 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 Last Myoview in 2018 was nonischemic.  Lab Results  Component Value Date   CHOL 182 02/11/2022   HDL 42.60 02/11/2022   LDLCALC  05/10/2021     Comment:     . LDL cholesterol not calculated. Triglyceride levels greater than 400 mg/dL invalidate  calculated LDL results. . Reference range: <100 . Desirable range <100 mg/dL for primary prevention;   <70 mg/dL for patients with CHD or diabetic patients  with > or = 2 CHD risk factors. Marland Kitchen LDL-C is now calculated using the Martin-Hopkins  calculation, which is a validated novel method providing  better accuracy than the Friedewald equation in the  estimation of LDL-C.  Cresenciano Genre et al. Annamaria Helling. 3244;010(27): 2061-2068  (http://education.[facility].com/faq/FAQ164)    LDLDIRECT 84.0 02/11/2022   TRIG (H) 02/11/2022    550.0 Triglyceride is over 400; calculations on Lipids are invalid.   CHOLHDL 4 02/11/2022  Using medications without problems: Muscle aches:  Diet compliance: Exercise: Other complaints:      Relevant past medical, surgical, family and social history reviewed and updated as indicated. Interim medical history since our last visit reviewed. Allergies and medications reviewed and updated. Outpatient Medications Prior to Visit  Medication Sig Dispense Refill   atorvastatin (LIPITOR) 80 MG tablet Take 1 tablet (80 mg total) by mouth daily. 90 tablet 3   feeding supplement (ENSURE IMMUNE HEALTH) LIQD Take 237 mLs by mouth in the morning.     fenofibrate (TRICOR) 145 MG tablet Take 145 mg by mouth daily.     fluocinonide (LIDEX) 0.05 % external solution Apply 1 application topically at bedtime as needed (psoriasis).     hydrocortisone cream 1 % Apply 1 application topically 2 (two)  times daily as needed for itching.     nitroGLYCERIN (NITROSTAT) 0.4 MG SL tablet Place 1 tablet (0.4 mg total) under the tongue every 5 (five) minutes x 3 doses as needed for chest pain. 30 tablet 0   omeprazole (PRILOSEC) 20 MG capsule TAKE 1 CAPSULE BY MOUTH EVERY DAY 90 capsule 1   traZODone (DESYREL) 50 MG tablet TAKE 1/2 TO 1 TABLET BY MOUTH AT BEDTIME AS NEEDED FOR SLEEP 90 tablet 0   triamcinolone (KENALOG) 0.1 % Apply 1 application topically 3 (three) times daily as needed  (psoriasis).     venlafaxine XR (EFFEXOR-XR) 75 MG 24 hr capsule Take 75 mg by mouth daily.     carvedilol (COREG) 12.5 MG tablet Take 1 tablet (12.5 mg total) by mouth 2 (two) times daily with a meal. Please make overdue appt with Dr. Johnsie Cancel for additional refills. FINAL ATTEMPT 15 tablet 0   guaiFENesin-codeine (ROBITUSSIN AC) 100-10 MG/5ML syrup Take 5 mLs by mouth 2 (two) times daily as needed for cough (sedation precautions). 120 mL 0   predniSONE (DELTASONE) 20 MG tablet Take two tablets daily for 3 days followed by one tablet daily for 3 days 9 tablet 0   No facility-administered medications prior to visit.     Per HPI unless specifically indicated in ROS section below Review of Systems  Constitutional:  Negative for fatigue and fever.  HENT:  Negative for ear pain.   Eyes:  Negative for pain.  Respiratory:  Negative for cough and shortness of breath.   Cardiovascular:  Negative for chest pain, palpitations and leg swelling.  Gastrointestinal:  Negative for abdominal pain.  Genitourinary:  Negative for dysuria.  Musculoskeletal:  Negative for arthralgias.  Neurological:  Negative for syncope, light-headedness and headaches.  Psychiatric/Behavioral:  Negative for dysphoric mood.    Objective:  BP 106/70   Pulse (!) 102   Temp 97.6 F (36.4 C) (Temporal)   Ht '5\' 6"'$  (1.676 m)   Wt 199 lb 4 oz (90.4 kg)   SpO2 96%   BMI 32.16 kg/m   Wt Readings from Last 3 Encounters:  02/18/22 199 lb 4 oz (90.4 kg)  11/20/21 194 lb 12.8 oz (88.4 kg)  11/05/21 195 lb 6.4 oz (88.6 kg)      Physical Exam Constitutional:      Appearance: He is well-developed.  HENT:     Head: Normocephalic.     Right Ear: Hearing normal.     Left Ear: Hearing normal.     Nose: Nose normal.  Neck:     Thyroid: No thyroid mass or thyromegaly.     Vascular: No carotid bruit.     Trachea: Trachea normal.  Cardiovascular:     Rate and Rhythm: Normal rate and regular rhythm.     Pulses: Normal pulses.      Heart sounds: Heart sounds not distant. No murmur heard.    No friction rub. No gallop.     Comments: No peripheral edema Pulmonary:     Effort: Pulmonary effort is normal. No respiratory distress.     Breath sounds: Normal breath sounds.  Skin:    General: Skin is warm and dry.     Findings: No rash.  Psychiatric:        Speech: Speech normal.        Behavior: Behavior normal.        Thought Content: Thought content normal.       Results for orders placed or performed in visit  on 02/11/22  Comprehensive metabolic panel  Result Value Ref Range   Sodium 140 135 - 145 mEq/L   Potassium 4.5 3.5 - 5.1 mEq/L   Chloride 104 96 - 112 mEq/L   CO2 27 19 - 32 mEq/L   Glucose, Bld 180 (H) 70 - 99 mg/dL   BUN 18 6 - 23 mg/dL   Creatinine, Ser 1.36 0.40 - 1.50 mg/dL   Total Bilirubin 0.3 0.2 - 1.2 mg/dL   Alkaline Phosphatase 37 (L) 39 - 117 U/L   AST 18 0 - 37 U/L   ALT 22 0 - 53 U/L   Total Protein 6.8 6.0 - 8.3 g/dL   Albumin 4.2 3.5 - 5.2 g/dL   GFR 50.73 (L) >60.00 mL/min   Calcium 9.6 8.4 - 10.5 mg/dL  Lipid panel  Result Value Ref Range   Cholesterol 182 0 - 200 mg/dL   Triglycerides (H) 0.0 - 149.0 mg/dL    550.0 Triglyceride is over 400; calculations on Lipids are invalid.   HDL 42.60 >39.00 mg/dL   Total CHOL/HDL Ratio 4   Hemoglobin A1c  Result Value Ref Range   Hgb A1c MFr Bld 7.0 (H) 4.6 - 6.5 %  LDL cholesterol, direct  Result Value Ref Range   Direct LDL 84.0 mg/dL    Assessment and Plan  Controlled type 2 diabetes mellitus with other circulatory complication, without long-term current use of insulin (HCC) Assessment & Plan: Chronic, likely worsened due to recent prednisone use and the holidays. He will get back on track with low carbohydrate diet and increased exercise and weight management.  He would be a good candidate for a GLP-1 medication if not at goal at next check in 3 months   Hyperlipidemia associated with type 2 diabetes mellitus  ([place]) Assessment & Plan: Inadequate control despite compliance with atorvastatin 80 mg daily and fenofibrate 145 mg daily.  He is not compliant with diet and exercise.  We spent considerable time discussing improvements.  Will recheck this in 3 months.   Hypertension associated with diabetes ([place]) Assessment & Plan: Stable, chronic.  Continue current medication.   Coreg 12.5 mg p.o. twice daily     Return in about 3 months (around 05/19/2022) for diabetes follow up with fasting labs prior.   Eliezer Lofts, MD

## 2022-02-18 NOTE — Assessment & Plan Note (Signed)
Stable, chronic.  Continue current medication.   Coreg 12.5 mg p.o. twice daily

## 2022-02-18 NOTE — Patient Instructions (Signed)
Decrease ice cream, pop tart intake, work on decreasing  animal fats and carbs in diet.  Work on regular exercise.

## 2022-03-29 ENCOUNTER — Other Ambulatory Visit: Payer: Self-pay | Admitting: Family Medicine

## 2022-03-29 ENCOUNTER — Other Ambulatory Visit: Payer: Self-pay | Admitting: Cardiovascular Disease

## 2022-03-29 MED ORDER — TRAZODONE HCL 50 MG PO TABS: 25.0000 mg | ORAL_TABLET | Freq: Every evening | ORAL | 0 refills | Status: DC | PRN

## 2022-04-14 ENCOUNTER — Telehealth: Payer: Self-pay | Admitting: *Deleted

## 2022-04-14 DIAGNOSIS — E1169 Type 2 diabetes mellitus with other specified complication: Secondary | ICD-10-CM

## 2022-04-14 DIAGNOSIS — E1159 Type 2 diabetes mellitus with other circulatory complications: Secondary | ICD-10-CM

## 2022-04-14 NOTE — Telephone Encounter (Signed)
-----   Message from Roy [name] sent at 04/14/2022 10:06 AM EDT ----- Regarding: Lab orders for Monday, 4.15.24 Lab orders, thanks

## 2022-04-28 ENCOUNTER — Other Ambulatory Visit (INDEPENDENT_AMBULATORY_CARE_PROVIDER_SITE_OTHER): Payer: Medicare HMO

## 2022-04-28 DIAGNOSIS — E1159 Type 2 diabetes mellitus with other circulatory complications: Secondary | ICD-10-CM

## 2022-04-28 DIAGNOSIS — E1169 Type 2 diabetes mellitus with other specified complication: Secondary | ICD-10-CM

## 2022-04-28 DIAGNOSIS — E785 Hyperlipidemia, unspecified: Secondary | ICD-10-CM | POA: Diagnosis not present

## 2022-04-28 LAB — LDL CHOLESTEROL, DIRECT: Direct LDL: 92 mg/dL

## 2022-04-28 LAB — LIPID PANEL
Cholesterol: 191 mg/dL (ref 0–200)
HDL: 64.1 mg/dL (ref >39.00)
Total CHOL/HDL Ratio: 3
Triglycerides: 467 mg/dL — ABNORMAL HIGH (ref 0.0–149.0)

## 2022-04-28 LAB — COMPREHENSIVE METABOLIC PANEL
ALT: 28 U/L (ref 0–53)
AST: 23 U/L (ref 0–37)
Albumin: 4.4 g/dL (ref 3.5–5.2)
Alkaline Phosphatase: 37 U/L — ABNORMAL LOW (ref 39–117)
BUN: 20 mg/dL (ref 6–23)
CO2: 27 mEq/L (ref 19–32)
Calcium: 10.1 mg/dL (ref 8.4–10.5)
Chloride: 103 mEq/L (ref 96–112)
Creatinine, Ser: 1.58 mg/dL — ABNORMAL HIGH (ref 0.40–1.50)
GFR: 42.31 mL/min — ABNORMAL LOW (ref >60.00)
Glucose, Bld: 159 mg/dL — ABNORMAL HIGH (ref 70–99)
Potassium: 4.4 mEq/L (ref 3.5–5.1)
Sodium: 140 mEq/L (ref 135–145)
Total Bilirubin: 0.5 mg/dL (ref 0.2–1.2)
Total Protein: 7.2 g/dL (ref 6.0–8.3)

## 2022-04-28 LAB — HEMOGLOBIN A1C: Hgb A1c MFr Bld: 6.7 % — ABNORMAL HIGH (ref 4.6–6.5)

## 2022-04-29 LAB — MICROALBUMIN / CREATININE URINE RATIO
Creatinine,U: 180.6 mg/dL
Microalb Creat Ratio: 2.4 mg/g (ref 0.0–30.0)
Microalb, Ur: 4.4 mg/dL — ABNORMAL HIGH (ref 0.0–1.9)

## 2022-04-29 NOTE — Progress Notes (Signed)
No critical labs need to be addressed urgently. We will discuss labs in detail at upcoming office visit.   

## 2022-05-15 ENCOUNTER — Ambulatory Visit (INDEPENDENT_AMBULATORY_CARE_PROVIDER_SITE_OTHER): Payer: Medicare HMO

## 2022-05-15 VITALS — Ht 66.0 in | Wt 186.0 lb

## 2022-05-15 DIAGNOSIS — Z0000 Encounter for general adult medical examination without abnormal findings: Secondary | ICD-10-CM | POA: Diagnosis not present

## 2022-05-15 NOTE — Progress Notes (Signed)
I connected with  Louie Bun on 05/15/22 by a audio enabled telemedicine application and verified that I am speaking with the correct person using two identifiers.  Patient Location: Home  Provider Location: Home Office  I discussed the limitations of evaluation and management by telemedicine. The patient expressed understanding and agreed to proceed.  Subjective:   Roy [name] is a 77 y.o. male who presents for Medicare Annual/Subsequent preventive examination.  Review of Systems      Cardiac Risk Factors include: advanced age (>31men, >74 women);male gender;Other (see comment)     Objective:    Today's Vitals   05/15/22 0843  Weight: 186 lb (84.4 kg)  Height: 5\' 6"  (1.676 m)   Body mass index is 30.02 kg/m.     05/15/2022    8:59 AM 05/02/2021    2:24 PM 03/21/2021    2:01 PM 02/12/2021    7:38 AM 09/24/2020    5:00 PM 09/18/2020   12:56 PM 05/01/2020    2:05 PM  Advanced Directives  Does Patient Have a Medical Advance Directive? Yes No Yes Yes Yes Yes Yes  Type of Estate agent of [place];Living will  Healthcare Power of [place];Living will Healthcare Power of [place];Living will Healthcare Power of [place];Living will Healthcare Power of [place];Living will Healthcare Power of [place];Living will  Does patient want to make changes to medical advance directive?    No - Patient declined No - Patient declined    Copy of Healthcare Power of Attorney in Chart? No - copy requested  No - copy requested No - copy requested   No - copy requested  Would patient like information on creating a medical advance directive?  No - Patient declined         Current Medications (verified) Outpatient Encounter Medications as of 05/15/2022  Medication Sig   aspirin EC 81 MG tablet Take 81 mg by mouth daily. Swallow whole.   atorvastatin (LIPITOR) 80 MG tablet Take 1 tablet (80 mg total) by mouth daily.   feeding supplement (ENSURE IMMUNE HEALTH) LIQD Take  237 mLs by mouth in the morning.   fenofibrate (TRICOR) 145 MG tablet TAKE 1 TABLET BY MOUTH EVERY DAY   fluocinonide (LIDEX) 0.05 % external solution Apply 1 application topically at bedtime as needed (psoriasis).   hydrocortisone cream 1 % Apply 1 application topically 2 (two) times daily as needed for itching.   Magnesium 250 MG TABS Take by mouth.   omeprazole (PRILOSEC) 20 MG capsule TAKE 1 CAPSULE BY MOUTH EVERY DAY   traZODone (DESYREL) 50 MG tablet Take 0.5-1 tablets (25-50 mg total) by mouth at bedtime as needed. for sleep   triamcinolone (KENALOG) 0.1 % Apply 1 application topically 3 (three) times daily as needed (psoriasis).   venlafaxine XR (EFFEXOR-XR) 75 MG 24 hr capsule TAKE 1 CAPSULE BY MOUTH DAILY WITH BREAKFAST.   nitroGLYCERIN (NITROSTAT) 0.4 MG SL tablet Place 1 tablet (0.4 mg total) under the tongue every 5 (five) minutes x 3 doses as needed for chest pain. (Patient not taking: Reported on 05/15/2022)   No facility-administered encounter medications on file as of 05/15/2022.    Allergies (verified) Patient has no known allergies.   History: Past Medical History:  Diagnosis Date   Basal cell carcinoma    Bilateral Legs   CAD (coronary artery disease)    a.  stent to the RCA in 2001;  b.  Negative Myoview in January 2012;  c.  LHC 4/16:  dLM  20, small D1 50-70, D2 sub-total, oLCx 50-70, RCA stent ok, dPLB 90, EF 35-40% >> DES to D2   History of echocardiogram    a.  Echo 4/16: Mild LVH, EF 55-60%, grade 1 diastolic dysfunction, normal wall motion, MAC   HLD (hyperlipidemia)    HTN (hypertension)    MS (multiple sclerosis) (HCC)    OSA (obstructive sleep apnea)     no cpap    Pre-diabetes    Vertigo    Past Surgical History:  Procedure Laterality Date   BIOPSY  07/26/2019   Procedure: BIOPSY;  Surgeon: Iva Boop, MD;  Location[facility] ENDOSCOPY;  Service: Endoscopy;;   CATARACT EXTRACTION, BILATERAL     COLON RESECTION SIGMOID N/A 08/30/2019   Procedure: SIGMOID  COLON RESECTION;  Surgeon: Berna Bue, MD;  Location: [facility] OR;  Service: General;  Laterality: N/A;   COLONOSCOPY N/A 07/26/2019   Procedure: COLONOSCOPY;  Surgeon: Iva Boop, MD;  Location: [facility] ENDOSCOPY;  Service: Endoscopy;  Laterality: N/A;   COLONOSCOPY     COLOSTOMY N/A 08/30/2019   Procedure: COLOSTOMY;  Surgeon: Berna Bue, MD;  Location: [facility] OR;  Service: General;  Laterality: N/A;   COLOSTOMY TAKEDOWN N/A 03/07/2020   Procedure: LAPAROSCOPIC COLOSTOMY REVERSAL, RIGID PROCTOSCOPY;  Surgeon: Berna Bue, MD;  Location: [facility] ORS;  Service: General;  Laterality: N/A;   CORONARY STENT PLACEMENT  2001    RCA   INCISIONAL HERNIA REPAIR N/A 09/24/2020   Procedure: LAPAROSCOPIC INCISIONAL HERNIA REPAIR WITH MESH;  Surgeon: Berna Bue, MD;  Location: [facility] ORS;  Service: General;  Laterality: N/A;   LEFT HEART CATHETERIZATION WITH CORONARY ANGIOGRAM N/A 02/13/2012   Procedure: LEFT HEART CATHETERIZATION WITH CORONARY ANGIOGRAM;  Surgeon: Peter M Swaz[place]and, MD;  Location: [facility] CATH LAB;  Service: Cardiovascular;  Laterality: N/A;   LEFT HEART CATHETERIZATION WITH CORONARY ANGIOGRAM N/A 04/24/2014   Procedure: LEFT HEART CATHETERIZATION WITH CORONARY ANGIOGRAM;  Surgeon: Runell Gess, MD;  Location: [facility] CATH LAB;  Service: Cardiovascular;  Laterality: N/A;   LYSIS OF ADHESION N/A 03/07/2020   Procedure: LYSIS OF ADHESION;  Surgeon: Berna Bue, MD;  Location: [facility] ORS;  Service: General;  Laterality: N/A;   TONSILLECTOMY     Family History  Problem Relation Age of Onset   Cirrhosis Mother    Alcohol abuse Mother    Pancreatic cancer Mother    Heart disease Father    Hypertension Father    Peripheral vascular disease Father    Coronary artery disease Father    Depression Sister    Heart attack Neg Hx    Stroke Neg Hx    Stomach cancer Neg Hx    Colon cancer Neg Hx    Esophageal cancer Neg Hx    Social History   Socioeconomic History   Marital status: Divorced     Spouse name: Not on file   Number of children: Not on file   Years of education: Not on file   Highest education level: Not on file  Occupational History   Not on file  Tobacco Use   Smoking status: Former    Packs/day: 1.00    Years: 25.00    Additional pack years: 0.00    Total pack years: 25.00    Types: Cigarettes    Quit date: 01/14/2000    Years since quitting: 22.3   Smokeless tobacco: Never  Vaping Use   Vaping Use: Never used  Substance and Sexual Activity   Alcohol  use: Yes    Alcohol/week: 3.0 standard drinks of alcohol    Types: 1 Cans of beer, 2 Shots of liquor per week    Comment: 4-5 drinks per week    Drug use: No   Sexual activity: Not Currently  Other Topics Concern   Not on file  Social History Narrative   2 kids, 4 grandkids, separated   Medical illustrator.   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   Right handed   Drinks caffeine   Single story home   Social Determinants of Health   Financial Resource Strain: Low Risk  (05/15/2022)   Overall Financial Resource Strain (CARDIA)    Difficulty of Paying Living Expenses: Not hard at all  Food Insecurity: No Food Insecurity (05/15/2022)   Hunger Vital Sign    Worried About Running Out of Food in the Last Year: Never true    Ran Out of Food in the Last Year: Never true  Transportation Needs: No Transportation Needs (05/15/2022)   PRAPARE - Administrator, Civil Service (Medical): No    Lack of Transportation (Non-Medical): No  Physical Activity: Insufficiently Active (05/15/2022)   Exercise Vital Sign    Days of Exercise per Week: 3 days    Minutes of Exercise per Session: 30 min  Stress: No Stress Concern Present (05/15/2022)   [employer] of Occupational Health - Occupational Stress Questionnaire    Feeling of Stress : Not at all  Social Connections: Socially Isolated (05/15/2022)   Social Connection and Isolation Panel [NHANES]    Frequency of Communication with Friends and Family:  More than three times a week    Frequency of Social Gatherings with Friends and Family: More than three times a week    Attends Religious Services: Never    Database administrator or Organizations: No    Attends Engineer, structural: Never    Marital Status: Divorced    Tobacco Counseling Counseling given: Not Answered   Clinical Intake:  Pre-visit preparation completed: Yes  Pain : No/denies pain     Nutritional Risks: None Diabetes: No  How often do you need to have someone help you when you read instructions, pamphlets, or other written materials from your doctor or pharmacy?: 1 - Never  Diabetic? no  Interpreter Needed?: No  Information entered by :: C.Franky Reier LPN   Activities of Daily Living    05/15/2022    9:00 AM 05/11/2022   10:19 AM  In your present state of health, do you have any difficulty performing the following activities:  Hearing? 0 1  Vision? 0 0  Difficulty concentrating or making decisions? 1 0  Comment Occasionally has difficulty with finding words in conversation.   Walking or climbing stairs? 1 1  Comment Balance issues due to MS   Dressing or bathing? 0 0  Doing errands, shopping? 0 0  Preparing Food and eating ? N N  Using the Toilet? N N  In the past six months, have you accidently leaked urine? Y Y  Comment occasionally   Do you have problems with loss of bowel control? N Y  Managing your Medications? N N  Managing your Finances? N N  Housekeeping or managing your Housekeeping? N N    Patient Care Team: Excell Seltzer, MD as PCP - General (Family Medicine) Wendall Stade, MD as PCP - Cardiology (Cardiology)  Indicate any recent Medical Services you may have received from other than [facility]  providers in the past year (date may be approximate).     Assessment:   This is a routine wellness examination for Angell.  Hearing/Vision screen Hearing Screening - Comments:: Wears aids Vision Screening - Comments:: Glasses - [facility]  Lois Huxley issues and exercise activities discussed: Current Exercise Habits: Home exercise routine, Type of exercise: walking, Time (Minutes): 30, Frequency (Times/Week): 3, Weekly Exercise (Minutes/Week): 90, Intensity: Mild, Exercise limited by: Other - see comments (MS)   Goals Addressed             This Visit's Progress    Patient Stated       Lose 10 pounds, work on balance and gait.       Depression Screen    05/15/2022    8:54 AM 02/18/2022    2:26 PM 08/15/2021   10:50 AM 05/10/2021    3:55 PM 05/02/2021    2:22 PM 11/09/2020    2:25 PM 05/01/2020    2:09 PM  PHQ 2/9 Scores  PHQ - 2 Score 3 1 2 4  0 0 0  PHQ- 9 Score 9 5 7 11  6  0    Fall Risk    05/15/2022    8:48 AM 05/11/2022   10:19 AM 02/18/2022    2:26 PM 05/02/2021    2:24 PM 10/18/2020   11:33 AM  Fall Risk   Falls in the past year? 0 0 0 0 1  Number falls in past yr: 0 0 0 0 1  Injury with Fall? 0 0 0 0 0  Risk for fall due to : Impaired balance/gait;Impaired mobility  No Fall Risks No Fall Risks History of fall(s);Impaired balance/gait  Risk for fall due to: Comment MS      Follow up Falls prevention discussed;Falls evaluation completed;Education provided  Falls evaluation completed Falls evaluation completed Falls prevention discussed;Education provided    FALL RISK PREVENTION PERTAINING TO THE HOME:  Any stairs in or around the home? Yes  If so, are there any without handrails? No  Home free of loose throw rugs in walkways, pet beds, electrical cords, etc? Yes  Adequate lighting in your home to reduce risk of falls? Yes   ASSISTIVE DEVICES UTILIZED TO PREVENT FALLS:  Life alert? No  Use of a cane, walker or w/c? No  Grab bars in the bathroom? Yes  Shower chair or bench in shower? Yes  Elevated toilet seat or a handicapped toilet? No    Cognitive Function:    05/01/2020    2:13 PM 11/06/2017    2:22 PM 11/06/2017   11:03 AM 07/08/2016   11:12 AM  MMSE - Mini Mental State Exam  Not  completed: Refused     Orientation to time  5 5 5   Orientation to Place  5 5 5   Registration   3 3  Attention/ Calculation   0 0  Recall   3 3  Language- name 2 objects   0 0  Language- repeat   1 1  Language- follow 3 step command   3 3  Language- read & follow direction   0 0  Write a sentence   0 0  Copy design   0 0  Total score   20 20        05/15/2022    9:05 AM  6CIT Screen  What Year? 0 points  What month? 0 points  What time? 0 points  Count back from 20 0 points  Months in  reverse 0 points  Repeat phrase 6 points  Total Score 6 points    Immunizations Immunization History  Administered Date(s) Administered   Fluad Quad(high Dose 65+) 09/13/2018, 09/27/2021   Influenza, High Dose Seasonal PF 10/08/2016, 09/16/2017, 09/29/2019, 09/20/2020   Influenza-Unspecified 11/13/2012, 09/14/2015   Moderna Covid-19 Vaccine Bivalent Booster 80yrs & up 01/17/2021   Moderna Sars-Covid-2 Vaccination 12/05/2019   PFIZER(Purple Top)SARS-COV-2 Vaccination 03/08/2019, 03/29/2019   Pneumococcal Conjugate-13 06/01/2015, 08/09/2018   Pneumococcal Polysaccharide-23 07/08/2016   Zoster Recombinat (Shingrix) 06/14/2019, 09/29/2019    TDAP status: Due, Education has been provided regarding the importance of this vaccine. Advised may receive this vaccine at local pharmacy or Health Dept. Aware to provide a copy of the vaccination record if obtained from local pharmacy or Health Dept. Verbalized acceptance and understanding.  Flu Vaccine status: Up to date  Pneumococcal vaccine status: Up to date  Covid-19 vaccine status: Information provided on how to obtain vaccines.   Qualifies for Shingles Vaccine? Yes   Zostavax completed Yes   Shingrix Completed?: Yes  Screening Tests Health Maintenance  Topic Date Due   DTaP/Tdap/Td (1 - Tdap) Never done   COVID-19 Vaccine (5 - 2023-24 season) 09/13/2021   FOOT EXAM  04/23/2022   OPHTHALMOLOGY EXAM  07/05/2022   INFLUENZA VACCINE   08/14/2022   HEMOGLOBIN A1C  10/28/2022   Diabetic kidney evaluation - eGFR measurement  04/28/2023   Diabetic kidney evaluation - Urine ACR  04/28/2023   Medicare Annual Wellness (AWV)  05/15/2023   Pneumonia Vaccine 78+ Years old  Completed   Hepatitis C Screening  Completed   Zoster Vaccines- Shingrix  Completed   HPV VACCINES  Aged Out   COLONOSCOPY (Pts 45-65yrs Insurance coverage will need to be confirmed)  Discontinued    Health Maintenance  Health Maintenance Due  Topic Date Due   DTaP/Tdap/Td (1 - Tdap) Never done   COVID-19 Vaccine (5 - 2023-24 season) 09/13/2021   FOOT EXAM  04/23/2022    Colorectal cancer screening: No longer required.   Lung Cancer Screening: (Low Dose CT Chest recommended if Age 70-80 years, 30 pack-year currently smoking OR have quit w/in 15years.) does not qualify.   Lung Cancer Screening Referral: no  Additional Screening:  Hepatitis C Screening: does qualify; Completed 07/26/2019  Vision Screening: Recommended annual ophthalmology exams for early detection of glaucoma and other disorders of the eye. Is the patient up to date with their annual eye exam?  Yes  Who is the provider or what is the name of the office in which the patient attends annual eye exams? [facility] If pt is not established with a provider, would they like to be referred to a provider to establish care? Yes .   Dental Screening: Recommended annual dental exams for proper oral hygiene  Community Resource Referral / Chronic Care Management: CRR required this visit?  No   CCM required this visit?  No      Plan:     I have personally reviewed and noted the following in the patient's chart:   Medical and social history Use of alcohol, tobacco or illicit drugs  Current medications and supplements including opioid prescriptions. Patient is not currently taking opioid prescriptions. Functional ability and status Nutritional status Physical activity Advanced  directives List of other physicians Hospitalizations, surgeries, and ER visits in previous 12 months Vitals Screenings to include cognitive, depression, and falls Referrals and appointments  In addition, I have reviewed and discussed with patient certain preventive protocols,  quality metrics, and best practice recommendations. A written personalized care plan for preventive services as well as general preventive health recommendations were provided to patient.     Maryan Puls, LPN   01/18/1094   Nurse Notes: Pt stated that he thinks he has an abdominal hernia.Denies any pain or discomfort. Pt declined appointment for evaluation but stated that if it becomes bothersome he will call to schedule OV.

## 2022-05-15 NOTE — Patient Instructions (Signed)
Roy [name] , Thank you for taking time to come for your Medicare Wellness Visit. I appreciate your ongoing commitment to your health goals. Please review the following plan we discussed and let me know if I can assist you in the future.   These are the goals we discussed:  Goals      DIET - EAT MORE FRUITS AND VEGETABLES     Patient Stated     05/01/2020, I will continue physical therapy for 2 days a week for 1 hour.      Patient Stated     Lose 10 pounds, work on balance and gait.        This is a list of the screening recommended for you and due dates:  Health Maintenance  Topic Date Due   DTaP/Tdap/Td vaccine (1 - Tdap) Never done   COVID-19 Vaccine (5 - 2023-24 season) 09/13/2021   Complete foot exam   04/23/2022   Eye exam for diabetics  07/05/2022   Flu Shot  08/14/2022   Hemoglobin A1C  10/28/2022   Yearly kidney function blood test for diabetes  04/28/2023   Yearly kidney health urinalysis for diabetes  04/28/2023   Medicare Annual Wellness Visit  05/15/2023   Pneumonia Vaccine  Completed   Hepatitis C Screening: USPSTF Recommendation to screen - Ages 31-79 yo.  Completed   Zoster (Shingles) Vaccine  Completed   HPV Vaccine  Aged Out   Colon Cancer Screening  Discontinued    Advanced directives: Please bring a copy of your health care power of attorney and living will to the office to be added to your chart at your convenience.   Conditions/risks identified: Aim for 30 minutes of exercise or brisk walking, 6-8 glasses of water, and 5 servings of fruits and vegetables each day.   Next appointment: Follow up in one year for your annual wellness visit. 05/18/23 @ 2:00 telephone visit.  Preventive Care 59 Years and Older, Male  Preventive care refers to lifestyle choices and visits with your health care provider that can promote health and wellness. What does preventive care include? A yearly physical exam. This is also called an annual well check. Dental exams once  or twice a year. Routine eye exams. Ask your health care provider how often you should have your eyes checked. Personal lifestyle choices, including: Daily care of your teeth and gums. Regular physical activity. Eating a healthy diet. Avoiding tobacco and drug use. Limiting alcohol use. Practicing safe sex. Taking low doses of aspirin every day. Taking vitamin and mineral supplements as recommended by your health care provider. What happens during an annual well check? The services and screenings done by your health care provider during your annual well check will depend on your age, overall health, lifestyle risk factors, and family history of disease. Counseling  Your health care provider may ask you questions about your: Alcohol use. Tobacco use. Drug use. Emotional well-being. Home and relationship well-being. Sexual activity. Eating habits. History of falls. Memory and ability to understand (cognition). Work and work Astronomer. Screening  You may have the following tests or measurements: Height, weight, and BMI. Blood pressure. Lipid and cholesterol levels. These may be checked every 5 years, or more frequently if you are over 70 years old. Skin check. Lung cancer screening. You may have this screening every year starting at age 72 if you have a 30-pack-year history of smoking and currently smoke or have quit within the past 15 years. Fecal occult blood test (FOBT)  of the stool. You may have this test every year starting at age 81. Flexible sigmoidoscopy or colonoscopy. You may have a sigmoidoscopy every 5 years or a colonoscopy every 10 years starting at age 9. Prostate cancer screening. Recommendations will vary depending on your family history and other risks. Hepatitis C blood test. Hepatitis B blood test. Sexually transmitted disease (STD) testing. Diabetes screening. This is done by checking your blood sugar (glucose) after you have not eaten for a while (fasting).  You may have this done every 1-3 years. Abdominal aortic aneurysm (AAA) screening. You may need this if you are a current or former smoker. Osteoporosis. You may be screened starting at age 61 if you are at high risk. Talk with your health care provider about your test results, treatment options, and if necessary, the need for more tests. Vaccines  Your health care provider may recommend certain vaccines, such as: Influenza vaccine. This is recommended every year. Tetanus, diphtheria, and acellular pertussis (Tdap, Td) vaccine. You may need a Td booster every 10 years. Zoster vaccine. You may need this after age 45. Pneumococcal 13-valent conjugate (PCV13) vaccine. One dose is recommended after age 47. Pneumococcal polysaccharide (PPSV23) vaccine. One dose is recommended after age 24. Talk to your health care provider about which screenings and vaccines you need and how often you need them. This information is not intended to replace advice given to you by your health care provider. Make sure you discuss any questions you have with your health care provider. Document Released: 01/26/2015 Document Revised: 09/19/2015 Document Reviewed: 10/31/2014 Elsevier Interactive Patient Education  2017 [employer].  Fall Prevention in the Home Falls can cause injuries. They can happen to people of all ages. There are many things you can do to make your home safe and to help prevent falls. What can I do on the outside of my home? Regularly fix the edges of walkways and driveways and fix any cracks. Remove anything that might make you trip as you walk through a door, such as a raised step or threshold. Trim any bushes or trees on the path to your home. Use bright outdoor lighting. Clear any walking paths of anything that might make someone trip, such as rocks or tools. Regularly check to see if handrails are loose or broken. Make sure that both sides of any steps have handrails. Any raised decks and  porches should have guardrails on the edges. Have any leaves, snow, or ice cleared regularly. Use sand or salt on walking paths during winter. Clean up any spills in your garage right away. This includes oil or grease spills. What can I do in the bathroom? Use night lights. Install grab bars by the toilet and in the tub and shower. Do not use towel bars as grab bars. Use non-skid mats or decals in the tub or shower. If you need to sit down in the shower, use a plastic, non-slip stool. Keep the floor dry. Clean up any water that spills on the floor as soon as it happens. Remove soap buildup in the tub or shower regularly. Attach bath mats securely with double-sided non-slip rug tape. Do not have throw rugs and other things on the floor that can make you trip. What can I do in the bedroom? Use night lights. Make sure that you have a light by your bed that is easy to reach. Do not use any sheets or blankets that are too big for your bed. They should not hang down onto  the floor. Have a firm chair that has side arms. You can use this for support while you get dressed. Do not have throw rugs and other things on the floor that can make you trip. What can I do in the kitchen? Clean up any spills right away. Avoid walking on wet floors. Keep items that you use a lot in easy-to-reach places. If you need to reach something above you, use a strong step stool that has a grab bar. Keep electrical cords out of the way. Do not use floor polish or wax that makes floors slippery. If you must use wax, use non-skid floor wax. Do not have throw rugs and other things on the floor that can make you trip. What can I do with my stairs? Do not leave any items on the stairs. Make sure that there are handrails on both sides of the stairs and use them. Fix handrails that are broken or loose. Make sure that handrails are as long as the stairways. Check any carpeting to make sure that it is firmly attached to the  stairs. Fix any carpet that is loose or worn. Avoid having throw rugs at the top or bottom of the stairs. If you do have throw rugs, attach them to the floor with carpet tape. Make sure that you have a light switch at the top of the stairs and the bottom of the stairs. If you do not have them, ask someone to add them for you. What else can I do to help prevent falls? Wear shoes that: Do not have high heels. Have rubber bottoms. Are comfortable and fit you well. Are closed at the toe. Do not wear sandals. If you use a stepladder: Make sure that it is fully opened. Do not climb a closed stepladder. Make sure that both sides of the stepladder are locked into place. Ask someone to hold it for you, if possible. Clearly mark and make sure that you can see: Any grab bars or handrails. First and last steps. Where the edge of each step is. Use tools that help you move around (mobility aids) if they are needed. These include: Canes. Walkers. Scooters. Crutches. Turn on the lights when you go into a dark area. Replace any light bulbs as soon as they burn out. Set up your furniture so you have a clear path. Avoid moving your furniture around. If any of your floors are uneven, fix them. If there are any pets around you, be aware of where they are. Review your medicines with your doctor. Some medicines can make you feel dizzy. This can increase your chance of falling. Ask your doctor what other things that you can do to help prevent falls. This information is not intended to replace advice given to you by your health care provider. Make sure you discuss any questions you have with your health care provider. Document Released: 10/26/2008 Document Revised: 06/07/2015 Document Reviewed: 02/03/2014 Elsevier Interactive Patient Education  2017 [employer].

## 2022-05-20 ENCOUNTER — Ambulatory Visit (INDEPENDENT_AMBULATORY_CARE_PROVIDER_SITE_OTHER): Payer: Medicare HMO | Admitting: Family Medicine

## 2022-05-20 VITALS — BP 110/78 | HR 116 | Temp 98.4°F | Ht 66.0 in | Wt 194.4 lb

## 2022-05-20 DIAGNOSIS — R351 Nocturia: Secondary | ICD-10-CM | POA: Diagnosis not present

## 2022-05-20 DIAGNOSIS — E1159 Type 2 diabetes mellitus with other circulatory complications: Secondary | ICD-10-CM

## 2022-05-20 DIAGNOSIS — E1169 Type 2 diabetes mellitus with other specified complication: Secondary | ICD-10-CM

## 2022-05-20 DIAGNOSIS — I152 Hypertension secondary to endocrine disorders: Secondary | ICD-10-CM

## 2022-05-20 DIAGNOSIS — R251 Tremor, unspecified: Secondary | ICD-10-CM | POA: Diagnosis not present

## 2022-05-20 DIAGNOSIS — E785 Hyperlipidemia, unspecified: Secondary | ICD-10-CM | POA: Diagnosis not present

## 2022-05-20 DIAGNOSIS — K921 Melena: Secondary | ICD-10-CM | POA: Insufficient documentation

## 2022-05-20 DIAGNOSIS — R0789 Other chest pain: Secondary | ICD-10-CM | POA: Diagnosis not present

## 2022-05-20 LAB — HM DIABETES FOOT EXAM

## 2022-05-20 NOTE — Assessment & Plan Note (Signed)
Stable, chronic.  Continue current medication.   Coreg 12.5 mg p.o. twice daily 

## 2022-05-20 NOTE — Assessment & Plan Note (Addendum)
Stable, chronic.   Triglycerides improving but still high, LDL almost at goal. Continue current medication.  atorvastatin 80 mg daily and fenofibrate 145 mg daily

## 2022-05-20 NOTE — Assessment & Plan Note (Signed)
Chronic, improved control with diet changes.  Given improvement with hold off on GLP1 med but would be first choice if control worsens given ASCVD risk.

## 2022-05-20 NOTE — Assessment & Plan Note (Signed)
Acute, intermitttent.  Decrease caffeine.. consider lab eval if persistent.

## 2022-05-20 NOTE — Patient Instructions (Addendum)
Increase water daily, earlier in the day.  Decrease cafffeine after  4 PM. If chest tightness continues follow up with cardiology.  Start flomax for frequent uriation at night.

## 2022-05-20 NOTE — Assessment & Plan Note (Signed)
BPH likely cause.. enlarged uniform prostate on exam.  Offered flomax.. pt not interested.  Wills tart by moving liquids earlier in day, stop caffeine and ETOH.

## 2022-05-20 NOTE — Assessment & Plan Note (Signed)
Acute, new issue... hemeoccult today negative but small sample.  If persistent consider eval with repeat stool testing.  Consider cbc at next OV.

## 2022-05-20 NOTE — Assessment & Plan Note (Signed)
Acute, has improved with nitro.  EKG today unchanged from previous.  If persistent.. follow up with  cardiology for possible repeat stress testing.

## 2022-05-20 NOTE — Progress Notes (Signed)
Patient ID: Roy [name], male    DOB: 12-25-1945, 77 y.o.   MRN: [id number]  This visit was conducted in person.  BP 110/78   Pulse (!) 116   Temp 98.4 F (36.9 C) (Oral)   Ht 5\' 6"  (1.676 m)   Wt 194 lb 6.4 oz (88.2 kg)   SpO2 97%   BMI 31.38 kg/m    CC:  Chief Complaint  Patient presents with   Medical Management of Chronic Issues    Subjective:   HPI: Roy [name] is a 77 y.o. male presenting on 05/20/2022 for Medical Management of Chronic Issues   Diabetes: Improved control on no medication.. he has been trying to decrease ice cream and cookies.  Lab Results  Component Value Date   HGBA1C 6.7 (H) 04/28/2022  Using medications without difficulties: Hypoglycemic episodes: Hyperglycemic episodes: Feet problems: none Blood Sugars averaging: not checking eye exam within last year: yes    Wt Readings from Last 3 Encounters:  05/20/22 194 lb 6.4 oz (88.2 kg)  05/15/22 186 lb (84.4 kg)  02/18/22 199 lb 4 oz (90.4 kg)     Hypertension:   Well-controlled on Coreg 12.5 mg p.o. twice daily BP Readings from Last 3 Encounters:  05/20/22 110/78  02/18/22 106/70  11/20/21 124/78  Using medication without problems or lightheadedness:  none Chest pain with exertion:none, 2 episodes of chest tightness that he took nitroglycerin for. Edema:none Short of breath:none Average home BP nones: Other issues:  Wt Readings from Last 3 Encounters:  05/20/22 194 lb 6.4 oz (88.2 kg)  05/15/22 186 lb (84.4 kg)  02/18/22 199 lb 4 oz (90.4 kg)     Elevated Cholesterol: Triglycerides very elevated despite fenofibrate 145 mg daily and atorvastatin 80 mg daily LDL goal < 70 given  CAD... LDL has improved significantly from at 171 now down to 92 History of stenting to RCA in 2001. 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 Last Myoview in 2018 was nonischemic.  Lab Results  Component Value Date   CHOL 191 04/28/2022   HDL 64.10 04/28/2022   LDLCALC   05/10/2021     Comment:     . LDL cholesterol not calculated. Triglyceride levels greater than 400 mg/dL invalidate calculated LDL results. . Reference range: <100 . Desirable range <100 mg/dL for primary prevention;   <70 mg/dL for patients with CHD or diabetic patients  with > or = 2 CHD risk factors. Marland Kitchen LDL-C is now calculated using the Martin-Hopkins  calculation, which is a validated novel method providing  better accuracy than the Friedewald equation in the  estimation of LDL-C.  Horald Pollen et al. Lenox Ahr. 4034;742(59): 2061-2068  (http://education.[facility].com/faq/FAQ164)    LDLDIRECT 92.0 04/28/2022   TRIG (H) 04/28/2022    467.0 Triglyceride is over 400; calculations on Lipids are invalid.   CHOLHDL 3 04/28/2022  Using medications without problems: Muscle aches:  Diet compliance: moderate Exercise: walking QOD for 30 min Other complaints:    Slight decreased kidney function... drinking some water  has decreased some.  No recent  NSAIDs.   Using  fiber for constipation  He has noted  dark black stools.  Firm, some straining.. small amounts.  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   Has noted some increasing in nocturia, some urinary leakage... ongoing for  months... off and on.  No dysuria.   He has noted some tremor in hands and left arm shaking off and on in last months.  Relevant past medical, surgical, family and social history reviewed and updated as indicated. Interim medical history since our last visit reviewed. Allergies and medications reviewed and updated. Outpatient Medications Prior to Visit  Medication Sig Dispense Refill   aspirin EC 81 MG tablet Take 81 mg by mouth daily. Swallow whole.     atorvastatin (LIPITOR) 80 MG tablet Take 1 tablet (80 mg total) by mouth daily. 90 tablet 3   feeding supplement (ENSURE IMMUNE HEALTH) LIQD Take 237 mLs by  mouth in the morning.     fenofibrate (TRICOR) 145 MG tablet TAKE 1 TABLET BY MOUTH EVERY DAY 90 tablet 3   fluocinonide (LIDEX) 0.05 % external solution Apply 1 application topically at bedtime as needed (psoriasis).     hydrocortisone cream 1 % Apply 1 application topically 2 (two) times daily as needed for itching.     Magnesium 250 MG TABS Take by mouth.     nitroGLYCERIN (NITROSTAT) 0.4 MG SL tablet Place 1 tablet (0.4 mg total) under the tongue every 5 (five) minutes x 3 doses as needed for chest pain. 30 tablet 0   omeprazole (PRILOSEC) 20 MG capsule TAKE 1 CAPSULE BY MOUTH EVERY DAY 90 capsule 2   traZODone (DESYREL) 50 MG tablet Take 0.5-1 tablets (25-50 mg total) by mouth at bedtime as needed. for sleep 90 tablet 0   triamcinolone (KENALOG) 0.1 % Apply 1 application topically 3 (three) times daily as needed (psoriasis).     venlafaxine XR (EFFEXOR-XR) 75 MG 24 hr capsule TAKE 1 CAPSULE BY MOUTH DAILY WITH BREAKFAST. 90 capsule 1   No facility-administered medications prior to visit.     Per HPI unless specifically indicated in ROS section below Review of Systems  Constitutional:  Negative for fatigue and fever.  HENT:  Negative for ear pain.   Eyes:  Negative for pain.  Respiratory:  Negative for cough and shortness of breath.   Cardiovascular:  Negative for chest pain, palpitations and leg swelling.  Gastrointestinal:  Negative for abdominal pain.  Genitourinary:  Negative for dysuria.  Musculoskeletal:  Negative for arthralgias.  Neurological:  Negative for syncope, light-headedness and headaches.  Psychiatric/Behavioral:  Negative for dysphoric mood.    Objective:  BP 110/78   Pulse (!) 116   Temp 98.4 F (36.9 C) (Oral)   Ht 5\' 6"  (1.676 m)   Wt 194 lb 6.4 oz (88.2 kg)   SpO2 97%   BMI 31.38 kg/m   Wt Readings from Last 3 Encounters:  05/20/22 194 lb 6.4 oz (88.2 kg)  05/15/22 186 lb (84.4 kg)  02/18/22 199 lb 4 oz (90.4 kg)      Physical Exam Constitutional:       General: He is not in acute distress.    Appearance: Normal appearance. He is well-developed. He is not ill-appearing or toxic-appearing.  HENT:     Head: Normocephalic and atraumatic.     Right Ear: Hearing, tympanic membrane, ear canal and external ear normal.     Left Ear: Hearing, tympanic membrane, ear canal and external ear normal.     Nose: Nose normal.     Mouth/Throat:     Pharynx: Uvula midline.  Eyes:     General: Lids are normal. Lids are everted, no foreign bodies appreciated.     Conjunctiva/sclera: Conjunctivae normal.     Pupils: Pupils are equal, round,  and reactive to light.  Neck:     Thyroid: No thyroid mass or thyromegaly.     Vascular: No carotid bruit.     Trachea: Trachea and phonation normal.  Cardiovascular:     Rate and Rhythm: Normal rate and regular rhythm.     Pulses: Normal pulses.     Heart sounds: S1 normal and S2 normal. Heart sounds not distant. No murmur heard.    No friction rub. No gallop.     Comments: No peripheral edema Pulmonary:     Effort: Pulmonary effort is normal. No respiratory distress.     Breath sounds: Normal breath sounds. No wheezing, rhonchi or rales.  Abdominal:     General: Bowel sounds are normal.     Palpations: Abdomen is soft.     Tenderness: There is no abdominal tenderness. There is no guarding or rebound.     Hernia: No hernia is present.  Genitourinary:    Rectum: Normal. Guaiac result negative.     Comments:  Enlarged prostate , uniform on exam. Musculoskeletal:     Cervical back: Normal range of motion and neck supple.  Lymphadenopathy:     Cervical: No cervical adenopathy.  Skin:    General: Skin is warm and dry.     Findings: No rash.  Neurological:     Mental Status: He is alert.     Cranial Nerves: No cranial nerve deficit.     Sensory: No sensory deficit.     Gait: Gait normal.     Deep Tendon Reflexes: Reflexes are normal and symmetric.  Psychiatric:        Speech: Speech normal.         Behavior: Behavior normal.        Thought Content: Thought content normal.        Judgment: Judgment normal.    Diabetic foot exam: Normal inspection No skin breakdown No calluses  Normal DP pulses Normal sensation to light touch and monofilament Nails normal     Results for orders placed or performed in visit on 05/20/22  HM DIABETES FOOT EXAM  Result Value Ref Range   HM Diabetic Foot Exam done     Assessment and Plan  EKG: normal EKG, normal sinus rhythm except sinus tachy, no change from previous..  Hypertension associated with diabetes (HCC) Assessment & Plan: Stable, chronic.  Continue current medication.   Coreg 12.5 mg p.o. twice daily   Controlled type 2 diabetes mellitus with other circulatory complication, without long-term current use of insulin (HCC) Assessment & Plan: Chronic, improved control with diet changes.  Given improvement with hold off on GLP1 med but would be first choice if control worsens given ASCVD risk.   Hyperlipidemia associated with type 2 diabetes mellitus (HCC) Assessment & Plan: Stable, chronic.   Triglycerides improving but still high, LDL almost at goal. Continue current medication.  atorvastatin 80 mg daily and fenofibrate 145 mg daily    Nocturia Assessment & Plan:  BPH likely cause.. enlarged uniform prostate on exam.  Offered flomax.. pt not interested.  Wills tart by moving liquids earlier in day, stop caffeine and ETOH.   Melena Assessment & Plan:  Acute, new issue... hemeoccult today negative but small sample.  If persistent consider eval with repeat stool testing.  Consider cbc at next OV.   Chest tightness Assessment & Plan:  Acute, has improved with nitro.  EKG today unchanged from previous.  If persistent.. follow up with  cardiology for possible repeat stress  testing.  Orders: -     EKG 12-Lead  Tremor Assessment & Plan:  Acute, intermitttent.  Decrease caffeine.. consider lab eval if  persistent.     Return in about 4 weeks (around 06/17/2022) for  follow up muliptle issues.   Kerby Nora, MD

## 2022-05-23 IMAGING — CT CT ABD-PELV W/ CM
2 of 5 series · 15 of 46 positions shown, 17 images · IV contrast (Omni 300)
Comparison: None.

CLINICAL DATA: Abdominal distension and back pain.  Bloody stools.

EXAM:
CT ABDOMEN AND PELVIS WITH CONTRAST
TECHNIQUE: Multidetector CT imaging of the abdomen and pelvis was performed
using the standard protocol following bolus administration of
intravenous contrast.
CONTRAST:  75mL OMNIPAQUE IOHEXOL 300 MG/ML  SOLN

[Series 3: a/p w/ 5mm · axial · 0.90mm/px · z∈[+832,+1318]mm · 12 of 109 slices shown, 14 images]
[im 6/109  soft-tissue]
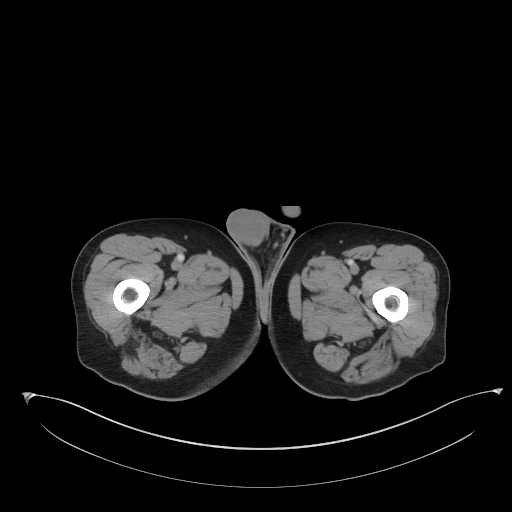
[im 6/109  bone]
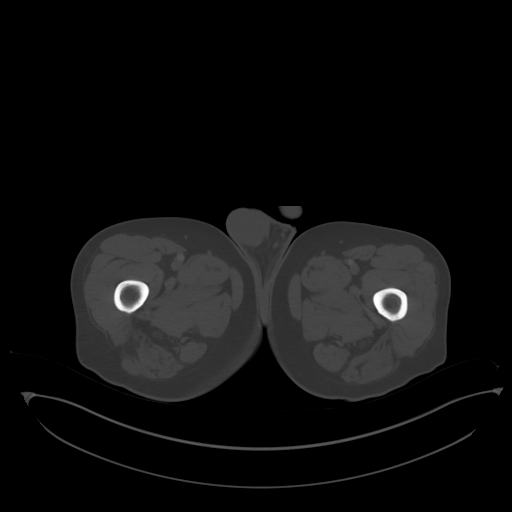
[im 18/109  soft-tissue]
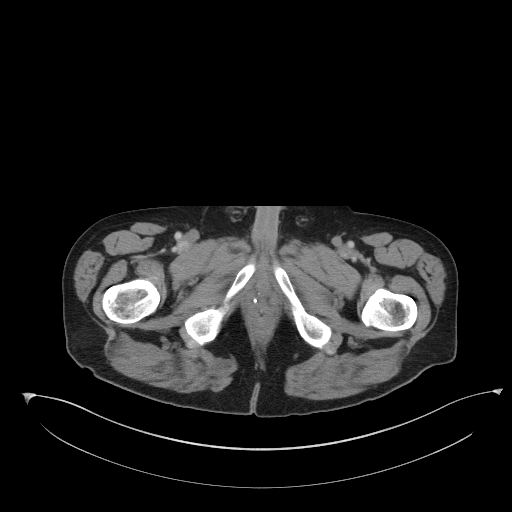
[im 23/109  soft-tissue]
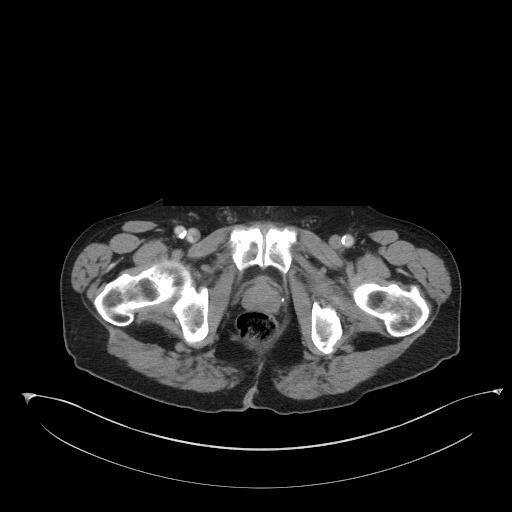
[im 35/109  soft-tissue]
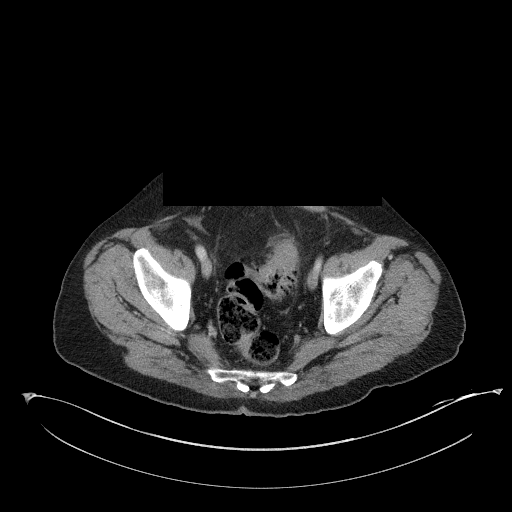
[im 40/109  soft-tissue]
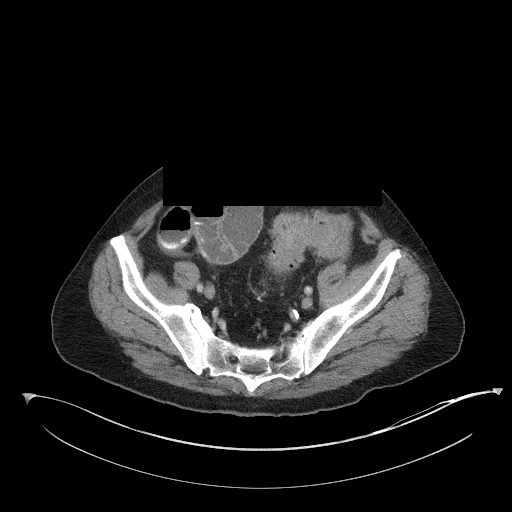
[im 52/109  soft-tissue]
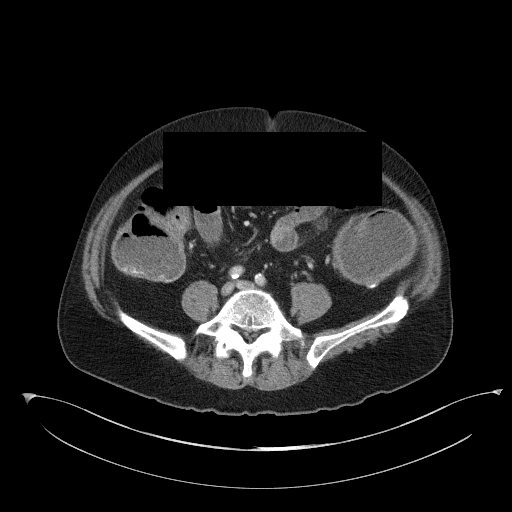
[im 57/109  soft-tissue]
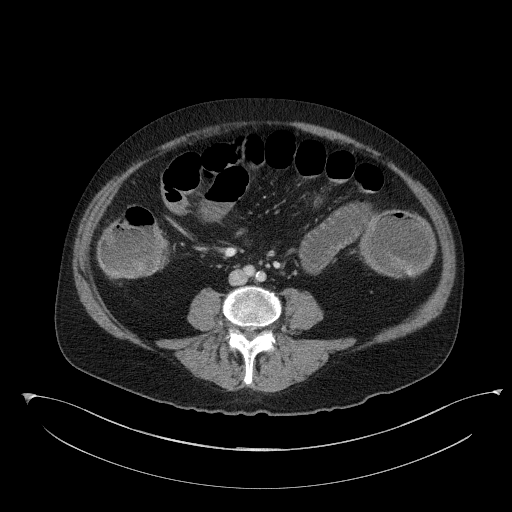
[im 69/109  soft-tissue]
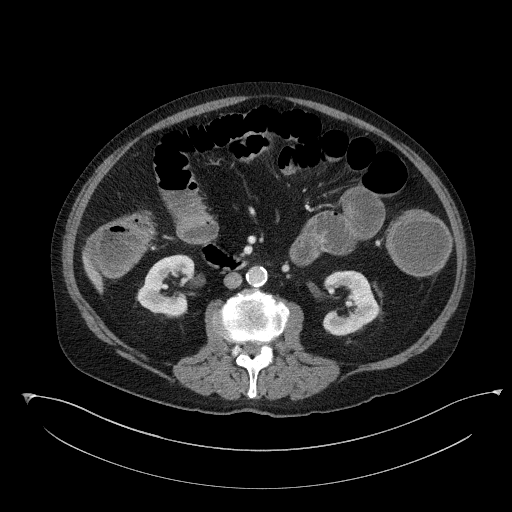
[im 74/109  soft-tissue]
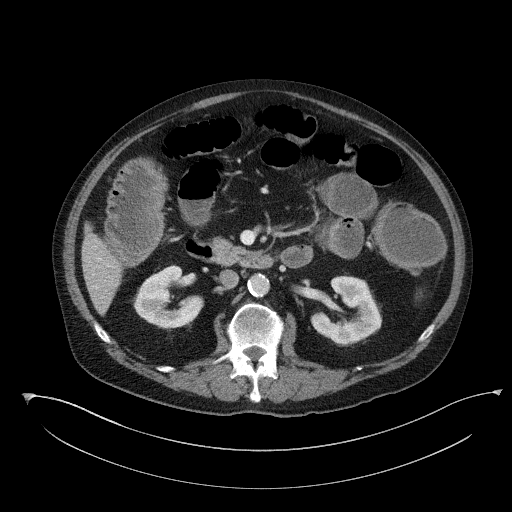
[im 74/109  bone]
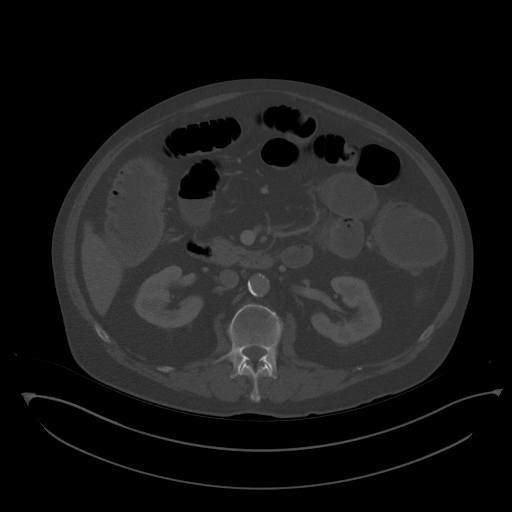
[im 86/109  soft-tissue]
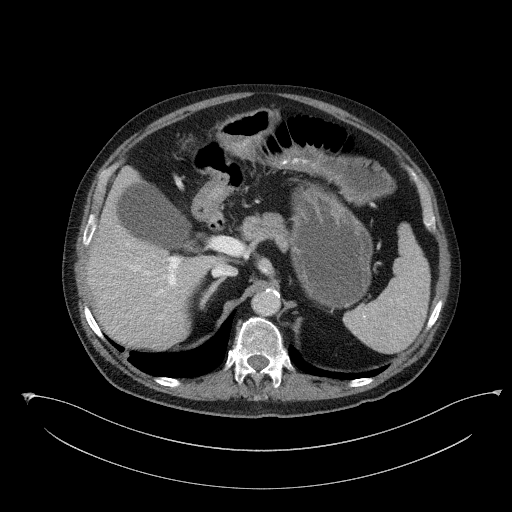
[im 91/109  soft-tissue]
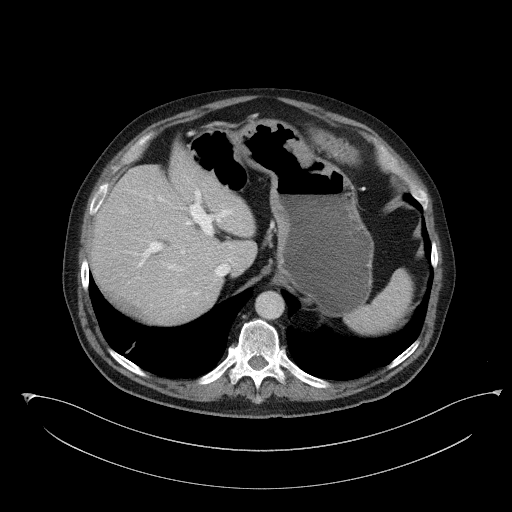
[im 103/109  soft-tissue]
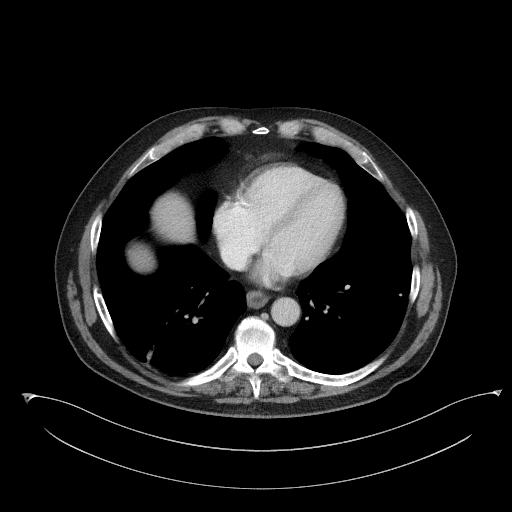

[Series 6: a/p w/ cor · coronal · 0.94mm/px · 3 of 170 slices shown]
[im 57/170  soft-tissue]
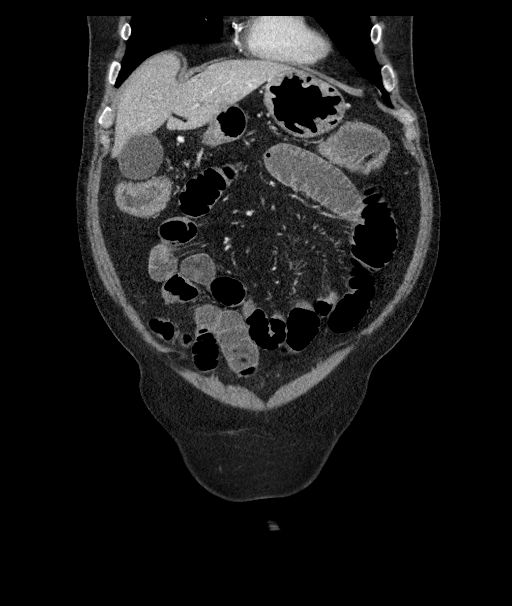
[im 76/170  soft-tissue]
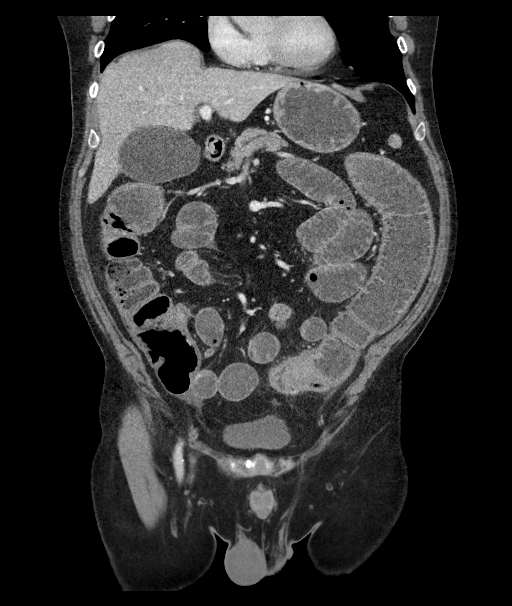
[im 94/170  soft-tissue]
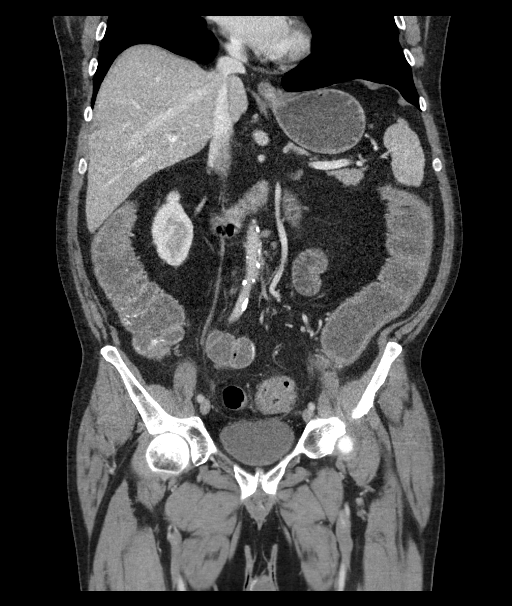

[15 of 46 positions shown; findings below may reference images not displayed]

FINDINGS: Lower chest: Streaky right basilar scarring changes are noted. There
is a calcified granuloma noted at the left lung base. No worrisome
pulmonary lesions or pleural effusion. The heart is normal in size.
No pericardial effusion. Coronary artery calcifications are noted.
The distal esophagus is grossly normal.

Hepatobiliary: Very small low-attenuation lesion at the hepatic dome
is likely a benign cyst. No worrisome hepatic lesions are
identified. The gallbladder is unremarkable. No common bile duct
dilatation.

Pancreas: No mass, inflammation or ductal dilatation.

Spleen: Normal size. A few small scattered calcified granulomas are
noted.

Adrenals/Urinary Tract: The adrenal glands are normal.

No renal lesions or hydronephrosis.  Bladder is unremarkable.

Stomach/Bowel: The stomach is mildly distended with fluid and air.
The small bowel is dilated and demonstrates scattered air-fluid
levels. The colon is also dilated with fluid and air down to an
obstructing enhancing lesion in the upper sigmoid colon near the
sigmoid colon descending colon junction region. This measures
approximately 5 cm and is highly suspicious for colonic neoplasm
causing low colonic obstruction. Recommend surgical consultation.

Vascular/Lymphatic: Advanced atherosclerotic calcifications
involving the aorta and iliac arteries. No aneurysm. The branch
vessels are patent. The major venous structures are patent.

There are small scattered subcentimeter retroperitoneal lymph nodes.
No mass or overt adenopathy. No pelvic adenopathy. I do not see any
enlarged lymph nodes in the sigmoid mesocolon or in the pelvis to
suggest locoregional adenopathy.

Reproductive: The prostate gland and seminal vesicles are
unremarkable.

Other: No free pelvic fluid collections. No inguinal mass or
adenopathy.

Musculoskeletal: No significant bony findings.
IMPRESSION: 1. 5 cm obstructing enhancing lesion in the upper sigmoid colon near
the sigmoid colon descending colon junction region worrisome for
colonic neoplasm causing low colonic obstruction. Fairly dilated
colon and small bowel above the lesion. Recommend surgical
consultation.
2. No findings for locoregional adenopathy or distant metastatic
disease.
3. Advanced atherosclerotic calcifications involving the aorta and
iliac arteries.
4. Aortic atherosclerosis.

Aortic Atherosclerosis ([id number]).

## 2022-07-11 DIAGNOSIS — C44719 Basal cell carcinoma of skin of left lower limb, including hip: Secondary | ICD-10-CM | POA: Diagnosis not present

## 2022-07-11 DIAGNOSIS — L820 Inflamed seborrheic keratosis: Secondary | ICD-10-CM | POA: Diagnosis not present

## 2022-07-25 ENCOUNTER — Other Ambulatory Visit: Payer: Self-pay | Admitting: Family Medicine

## 2022-08-07 DIAGNOSIS — Z961 Presence of intraocular lens: Secondary | ICD-10-CM | POA: Diagnosis not present

## 2022-08-07 DIAGNOSIS — H5203 Hypermetropia, bilateral: Secondary | ICD-10-CM | POA: Diagnosis not present

## 2022-08-07 DIAGNOSIS — H52223 Regular astigmatism, bilateral: Secondary | ICD-10-CM | POA: Diagnosis not present

## 2022-08-07 DIAGNOSIS — H524 Presbyopia: Secondary | ICD-10-CM | POA: Diagnosis not present

## 2022-08-07 DIAGNOSIS — E119 Type 2 diabetes mellitus without complications: Secondary | ICD-10-CM | POA: Diagnosis not present

## 2022-08-25 ENCOUNTER — Encounter (INDEPENDENT_AMBULATORY_CARE_PROVIDER_SITE_OTHER): Payer: Medicare HMO | Admitting: Ophthalmology

## 2022-08-25 DIAGNOSIS — H43813 Vitreous degeneration, bilateral: Secondary | ICD-10-CM | POA: Diagnosis not present

## 2022-08-25 DIAGNOSIS — H353132 Nonexudative age-related macular degeneration, bilateral, intermediate dry stage: Secondary | ICD-10-CM

## 2022-08-25 DIAGNOSIS — H5340 Unspecified visual field defects: Secondary | ICD-10-CM

## 2022-08-25 DIAGNOSIS — H35033 Hypertensive retinopathy, bilateral: Secondary | ICD-10-CM

## 2022-08-25 DIAGNOSIS — D3131 Benign neoplasm of right choroid: Secondary | ICD-10-CM | POA: Diagnosis not present

## 2022-08-25 DIAGNOSIS — I10 Essential (primary) hypertension: Secondary | ICD-10-CM | POA: Diagnosis not present

## 2022-09-03 DIAGNOSIS — H9193 Unspecified hearing loss, bilateral: Secondary | ICD-10-CM | POA: Diagnosis not present

## 2022-09-03 DIAGNOSIS — Z23 Encounter for immunization: Secondary | ICD-10-CM | POA: Diagnosis not present

## 2022-09-03 DIAGNOSIS — Z6829 Body mass index (BMI) 29.0-29.9, adult: Secondary | ICD-10-CM | POA: Diagnosis not present

## 2022-09-23 DIAGNOSIS — H5203 Hypermetropia, bilateral: Secondary | ICD-10-CM | POA: Diagnosis not present

## 2022-09-23 LAB — HM DIABETES EYE EXAM

## 2022-10-22 ENCOUNTER — Ambulatory Visit (INDEPENDENT_AMBULATORY_CARE_PROVIDER_SITE_OTHER): Payer: Medicare HMO | Admitting: Family Medicine

## 2022-10-22 ENCOUNTER — Encounter: Payer: Self-pay | Admitting: Family Medicine

## 2022-10-22 VITALS — BP 122/80 | HR 110 | Temp 98.3°F | Ht 66.0 in | Wt 192.0 lb

## 2022-10-22 DIAGNOSIS — E1159 Type 2 diabetes mellitus with other circulatory complications: Secondary | ICD-10-CM

## 2022-10-22 DIAGNOSIS — I152 Hypertension secondary to endocrine disorders: Secondary | ICD-10-CM

## 2022-10-22 DIAGNOSIS — M545 Low back pain, unspecified: Secondary | ICD-10-CM

## 2022-10-22 DIAGNOSIS — Z9861 Coronary angioplasty status: Secondary | ICD-10-CM | POA: Diagnosis not present

## 2022-10-22 DIAGNOSIS — R2689 Other abnormalities of gait and mobility: Secondary | ICD-10-CM | POA: Diagnosis not present

## 2022-10-22 DIAGNOSIS — R42 Dizziness and giddiness: Secondary | ICD-10-CM

## 2022-10-22 DIAGNOSIS — I200 Unstable angina: Secondary | ICD-10-CM | POA: Diagnosis not present

## 2022-10-22 DIAGNOSIS — I2510 Atherosclerotic heart disease of native coronary artery without angina pectoris: Secondary | ICD-10-CM

## 2022-10-22 DIAGNOSIS — M79605 Pain in left leg: Secondary | ICD-10-CM

## 2022-10-22 DIAGNOSIS — G35 Multiple sclerosis: Secondary | ICD-10-CM | POA: Diagnosis not present

## 2022-10-22 LAB — POCT GLYCOSYLATED HEMOGLOBIN (HGB A1C): Hemoglobin A1C: 6.7 % — AB (ref 4.0–5.6)

## 2022-10-22 MED ORDER — PREDNISONE 20 MG PO TABS
ORAL_TABLET | ORAL | 0 refills | Status: DC
Start: 2022-10-22 — End: 2022-11-10

## 2022-10-22 NOTE — Assessment & Plan Note (Signed)
Chronic, well controlled with diet. 

## 2022-10-22 NOTE — Assessment & Plan Note (Signed)
Chronic, slight progression seen on MRI in 2022.  Patient reports symptoms that could potentially be associated with multiple sclerosis.  He was unhappy wi.th Dr. Eliane Decree care. Referral to different neurologist office placed today to consider beginning medication to treat  MS.

## 2022-10-22 NOTE — Assessment & Plan Note (Addendum)
He reports continued increased frequency of anginal symptoms and increased use of nitroglycerin.  At last office visit EKG was unchanged.  I have again encouraged him to follow-up with cardiology for further evaluation.  He has appointment in December but I encouraged him to call to set up appointment sooner for further evaluation.  Return and ER precautions were provided.  Go to ER if chest pain not improving with nitroglycerin.  This issue could potentially be contributing to some of his fatigue and weakness on exertion.

## 2022-10-22 NOTE — Assessment & Plan Note (Signed)
Chronic, MRI in 2022 showed significant moderate to severe changes in the left lumbar spine multilevel.  Physical therapy was minimally helpful.  Per patient.  Will treat with prednisone taper but consider moving forward if not improving with referral to back specialist for possible steroid injections.  He will restart physical therapy at home.

## 2022-10-22 NOTE — Assessment & Plan Note (Addendum)
Chronic, potentially secondary to MS versus some of the vertigo symptoms he is having versus back issues resulting in leg weakness and pain. He is minimally active and there is likely also an element of deconditioning.

## 2022-10-22 NOTE — Assessment & Plan Note (Signed)
Stable, chronic.  Continue current medication.   Coreg 12.5 mg p.o. twice daily 

## 2022-10-22 NOTE — Progress Notes (Signed)
Patient ID: Roy [name], male    DOB: 1945-08-08, 77 y.o.   MRN: [id number]  This visit was conducted in person.  BP 122/80 (BP Location: Left Arm, Patient Position: Sitting, Cuff Size: Normal)   Pulse (!) 110   Temp 98.3 F (36.8 C) (Oral)   Ht 5\' 6"  (1.676 m)   Wt 192 lb (87.1 kg)   SpO2 94%   BMI 30.99 kg/m    CC:  Chief Complaint  Patient presents with   Back Pain    And balance issues that have been ongoing x several years     Subjective:   HPI: Roy [name] is a 77 y.o. male presenting on 10/22/2022 for Back Pain (And balance issues that have been ongoing x several years/)   Continued issue with poor balance for years. Poor equilibriuum. No dizziness, is is somewhat dizzy when turning head.   He has been having pain  in  left low back with walking , on left lower back into left leg. Ongoing in last 4 onths.  Gets better with rest. Pain with more walking.  Noted bilateral foot drop  Continued decrease in energy. Wakes up with energy but more endurance.   Saw neurologist Dr. Allena Katz 2022.. for  similar issues.  He was not pleased with this MD.  MRI brain: IMPRESSION: Periventricular deep white matter hyperintensities, with progression since 2011. These findings could be seen with demyelinating disease or chronic microvascular ischemia. No acute abnormality.  MRI spine: IMPRESSION: Moderate to severe left L2-3, bilateral L3-4, L4-5 neural foraminal narrowing.  Mild spinal canal narrowing at the L2-L5 levels.   Did PT  2022.   History of spinal stenosis, foot drop  DM  Lab Results  Component Value Date   HGBA1C 6.7 (A) 10/22/2022     Has upcoming appt this month with cardiology... increasing frequency of angina continued in last few months. requiring nitroglycerin.      Relevant past medical, surgical, family and social history reviewed and updated as indicated. Interim medical history since our last visit reviewed. Allergies and medications  reviewed and updated. Outpatient Medications Prior to Visit  Medication Sig Dispense Refill   aspirin EC 81 MG tablet Take 81 mg by mouth daily. Swallow whole.     atorvastatin (LIPITOR) 80 MG tablet TAKE 1 TABLET BY MOUTH EVERY DAY 90 tablet 3   feeding supplement (ENSURE IMMUNE HEALTH) LIQD Take 237 mLs by mouth in the morning.     fenofibrate (TRICOR) 145 MG tablet TAKE 1 TABLET BY MOUTH EVERY DAY 90 tablet 3   fluocinonide (LIDEX) 0.05 % external solution Apply 1 application topically at bedtime as needed (psoriasis).     hydrocortisone cream 1 % Apply 1 application topically 2 (two) times daily as needed for itching.     Magnesium 250 MG TABS Take by mouth.     nitroGLYCERIN (NITROSTAT) 0.4 MG SL tablet Place 1 tablet (0.4 mg total) under the tongue every 5 (five) minutes x 3 doses as needed for chest pain. 30 tablet 0   omeprazole (PRILOSEC) 20 MG capsule TAKE 1 CAPSULE BY MOUTH EVERY DAY 90 capsule 2   traZODone (DESYREL) 50 MG tablet Take 0.5-1 tablets (25-50 mg total) by mouth at bedtime as needed. for sleep 90 tablet 0   triamcinolone (KENALOG) 0.1 % Apply 1 application topically 3 (three) times daily as needed (psoriasis).     venlafaxine XR (EFFEXOR-XR) 75 MG 24 hr capsule TAKE 1 CAPSULE BY MOUTH  DAILY WITH BREAKFAST. 90 capsule 1   No facility-administered medications prior to visit.     Per HPI unless specifically indicated in ROS section below Review of Systems  Constitutional:  Negative for fatigue and fever.  HENT:  Negative for ear pain.   Eyes:  Negative for pain.  Respiratory:  Negative for cough and shortness of breath.   Cardiovascular:  Negative for chest pain, palpitations and leg swelling.  Gastrointestinal:  Negative for abdominal pain.  Genitourinary:  Negative for dysuria.  Musculoskeletal:  Positive for back pain. Negative for arthralgias.  Neurological:  Positive for dizziness, weakness and light-headedness. Negative for syncope and headaches.   Psychiatric/Behavioral:  Negative for dysphoric mood.    Objective:  BP 122/80 (BP Location: Left Arm, Patient Position: Sitting, Cuff Size: Normal)   Pulse (!) 110   Temp 98.3 F (36.8 C) (Oral)   Ht 5\' 6"  (1.676 m)   Wt 192 lb (87.1 kg)   SpO2 94%   BMI 30.99 kg/m   Wt Readings from Last 3 Encounters:  10/22/22 192 lb (87.1 kg)  05/20/22 194 lb 6.4 oz (88.2 kg)  05/15/22 186 lb (84.4 kg)      Physical Exam Constitutional:      Appearance: He is well-developed.  HENT:     Head: Normocephalic.     Right Ear: Hearing normal.     Left Ear: Hearing normal.     Nose: Nose normal.  Neck:     Thyroid: No thyroid mass or thyromegaly.     Vascular: No carotid bruit.     Trachea: Trachea normal.  Cardiovascular:     Rate and Rhythm: Normal rate and regular rhythm.     Pulses: Normal pulses.     Heart sounds: Heart sounds not distant. No murmur heard.    No friction rub. No gallop.     Comments: No peripheral edema Pulmonary:     Effort: Pulmonary effort is normal. No respiratory distress.     Breath sounds: Normal breath sounds.  Musculoskeletal:     Lumbar back: Tenderness present. Decreased range of motion. Negative right straight leg raise test and negative left straight leg raise test.     Comments: TTP in left lower back and sciatic notch  Skin:    General: Skin is warm and dry.     Findings: No rash.  Neurological:     Mental Status: He is oriented to person, place, and time.     Cranial Nerves: Cranial nerves 2-12 are intact.     Sensory: Sensation is intact.     Motor: Tremor present. No weakness or atrophy.  Psychiatric:        Attention and Perception: Attention normal.        Mood and Affect: Mood is anxious.        Speech: Speech normal.        Behavior: Behavior normal.        Thought Content: Thought content normal.        Cognition and Memory: Cognition normal.       Results for orders placed or performed in visit on 10/22/22  POCT glycosylated  hemoglobin (Hb A1C)  Result Value Ref Range   Hemoglobin A1C 6.7 (A) 4.0 - 5.6 %   HbA1c POC (<> result, manual entry)     HbA1c, POC (prediabetic range)     HbA1c, POC (controlled diabetic range)      Assessment and Plan  Personal history of multiple sclerosis (HCC)  Assessment & Plan: Chronic, slight progression seen on MRI in 2022.  Patient reports symptoms that could potentially be associated with multiple sclerosis.  He was unhappy wi.th Dr. Eliane Decree care. Referral to different neurologist office placed today to consider beginning medication to treat  MS.  Orders: -     Ambulatory referral to Neurology  Controlled type 2 diabetes mellitus with other circulatory complication, without long-term current use of insulin (HCC) Assessment & Plan: Chronic, well-controlled with diet.  Orders: -     POCT glycosylated hemoglobin (Hb A1C)  Poor balance Assessment & Plan: Chronic, potentially secondary to MS versus some of the vertigo symptoms he is having versus back issues resulting in leg weakness and pain. He is minimally active and there is likely also an element of deconditioning.   Orders: -     Ambulatory referral to Neurology  Low back pain radiating to left lower extremity Assessment & Plan: Chronic, MRI in 2022 showed significant moderate to severe changes in the left lumbar spine multilevel.  Physical therapy was minimally helpful.  Per patient.  Will treat with prednisone taper but consider moving forward if not improving with referral to back specialist for possible steroid injections.  He will restart physical therapy at home.   Vertigo -     Ambulatory referral to Neurology  Unstable angina [facility]) Assessment & Plan: He reports continued increased frequency of anginal symptoms and increased use of nitroglycerin.  At last office visit EKG was unchanged.  I have again encouraged him to follow-up with cardiology for further evaluation.  He has appointment in December  but I encouraged him to call to set up appointment sooner for further evaluation.  Return and ER precautions were provided.  Go to ER if chest pain not improving with nitroglycerin.  This issue could potentially be contributing to some of his fatigue and weakness on exertion.   CAD, RCA PCI 2001, urgent Dx2 DES 04/24/14  Hypertension associated with diabetes (HCC) Assessment & Plan: Stable, chronic.  Continue current medication.   Coreg 12.5 mg p.o. twice daily   Other orders -     predniSONE; 3 tabs by mouth daily x 3 days, then 2 tabs by mouth daily x 2 days then 1 tab by mouth daily x 2 days  Dispense: 15 tablet; Refill: 0    Return in about 4 weeks (around 11/19/2022) for  follow up mulitple issues.   Kerby Nora, MD

## 2022-10-22 NOTE — Patient Instructions (Addendum)
Call cardiology Dr. Eden Emms today about moving appt sooner given increasing angina and use of nitroglycerin.  We will try a trial of prednisone for back pain from possible  lumbar radiculopathy. Start home PT/ walking.. start using cane for balance.  We will refer to a different neurologist for MS treatment options and further evaluation.

## 2022-10-23 NOTE — Progress Notes (Signed)
Cardiology Office Note    Date:  10/24/2022   ID:  Roy, [name] 1945/03/02, MRN [id number]  PCP:  Excell Seltzer, MD  Cardiologist:  Dr. Eden Emms  Chief Complaint: hospital follow up  History of Present Illness:   Roy [name] is a 77 y.o. 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  Last Myoview in 2018 was nonischemic.  Admitted summer 2021 with large bowel obstruction with diverticulitis..  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  No chest pain Had colostomy reversal with Dr Fredricka Bonine 03/07/20 with no cardiac complications and held plavix  Has some spinal stenosis by MR 03/15/20 seeing Dr Allena Katz and getting PT  He has had relapse of left lower extremity basal cell carcinoma Rx with [facility] till June   He has had new onset angina a month ago. Exertional with mild ambulation. No rest pain Pain similar to prior angina. He also has had weakness, and new balance issues To see neurology at end of month Nitroglycerin has helped and he has been taking it almost daily. He is contemplating going to [place] for extended trip in January and may be in more remote environment   Past Medical History:  Diagnosis Date   Basal cell carcinoma    Bilateral Legs   CAD (coronary artery disease)    a.  stent to the RCA in 2001;  b.  Negative Myoview in January 2012;  c.  LHC 4/16:  dLM 20, small D1 50-70, D2 sub-total, oLCx 50-70, RCA stent ok, dPLB 90, EF 35-40% >> DES to D2   History of echocardiogram    a.  Echo 4/16: Mild LVH, EF 55-60%, grade 1 diastolic dysfunction, normal wall motion, MAC   HLD (hyperlipidemia)    HTN (hypertension)    MS (multiple sclerosis) (HCC)    OSA (obstructive sleep apnea)     no cpap     Pre-diabetes    Vertigo     Past Surgical History:  Procedure Laterality Date   BIOPSY  07/26/2019   Procedure: BIOPSY;  Surgeon: Iva Boop, MD;  Location[facility] ENDOSCOPY;  Service: Endoscopy;;   CATARACT EXTRACTION, BILATERAL     COLON RESECTION SIGMOID N/A 08/30/2019   Procedure: SIGMOID COLON RESECTION;  Surgeon: Berna Bue, MD;  Location: [facility] OR;  Service: General;  Laterality: N/A;   COLONOSCOPY N/A 07/26/2019   Procedure: COLONOSCOPY;  Surgeon: Iva Boop, MD;  Location: [facility] ENDOSCOPY;  Service: Endoscopy;  Laterality: N/A;   COLONOSCOPY     COLOSTOMY N/A 08/30/2019   Procedure: COLOSTOMY;  Surgeon: Berna Bue, MD;  Location: [facility] OR;  Service: General;  Laterality: N/A;   COLOSTOMY TAKEDOWN N/A 03/07/2020   Procedure: LAPAROSCOPIC COLOSTOMY REVERSAL, RIGID PROCTOSCOPY;  Surgeon: Berna Bue, MD;  Location: [facility] ORS;  Service: General;  Laterality: N/A;   CORONARY STENT PLACEMENT  2001    RCA   INCISIONAL HERNIA REPAIR N/A 09/24/2020   Procedure: LAPAROSCOPIC INCISIONAL HERNIA REPAIR WITH MESH;  Surgeon: Berna Bue, MD;  Location: [facility] ORS;  Service: General;  Laterality: N/A;   LEFT HEART CATHETERIZATION WITH CORONARY ANGIOGRAM N/A 02/13/2012   Procedure: LEFT HEART CATHETERIZATION WITH CORONARY ANGIOGRAM;  Surgeon: Rogue Pautler M [place], MD;  Location: [facility] CATH LAB;  Service: Cardiovascular;  Laterality: N/A;   LEFT HEART CATHETERIZATION WITH CORONARY ANGIOGRAM N/A 04/24/2014   Procedure: LEFT HEART CATHETERIZATION WITH CORONARY ANGIOGRAM;  Surgeon: Runell Gess, MD;  Location: [facility] CATH LAB;  Service: Cardiovascular;  Laterality: N/A;   LYSIS OF ADHESION N/A 03/07/2020   Procedure: LYSIS OF ADHESION;  Surgeon: Berna Bue, MD;  Location: [facility] ORS;  Service: General;  Laterality: N/A;   TONSILLECTOMY      Current Medications: Prior to Admission medications   Medication Sig Start Date End Date Taking? Authorizing Provider  acetaminophen (TYLENOL) 325 MG  tablet Take 2 tablets (650 mg total) by mouth every 6 (six) hours as needed. 09/06/19   Maczis, Elmer Sow, PA-C  carvedilol (COREG) 12.5 MG tablet TAKE 1 TABLET BY MOUTH TWICE A DAY 09/15/19   Wendall Stade, MD  clopidogrel (PLAVIX) 75 MG tablet TAKE 1 TABLET BY MOUTH EVERY DAY Patient taking differently: Take 75 mg by mouth daily.  10/12/18   Wendall Stade, MD  dicyclomine (BENTYL) 20 MG tablet Take 1 tablet (20 mg total) by mouth every 6 (six) hours as needed (abdominal pain). 07/28/19   Glade Lloyd, MD  feeding supplement, ENSURE ENLIVE, (ENSURE ENLIVE) LIQD Take 237 mLs by mouth 2 (two) times daily between meals. 09/06/19   Hughie Closs, MD  loperamide (IMODIUM) 2 MG capsule Take 1 capsule (2 mg total) by mouth 2 (two) times daily. 09/06/19   Maczis, Elmer Sow, PA-C  Magnesium Oxide 400 MG CAPS Take 1 capsule (400 mg total) by mouth daily. 08/15/19   Burnadette Pop, MD  mesalamine (LIALDA) 1.2 g EC tablet Take 4 tablets (4.8 g total) by mouth daily with breakfast. 08/16/19   Burnadette Pop, MD  nitroGLYCERIN (NITROSTAT) 0.4 MG SL tablet Place 1 tablet (0.4 mg total) under the tongue every 5 (five) minutes x 3 doses as needed for chest pain. 07/28/19   Glade Lloyd, MD  Nutritional Supplements (,FEEDING SUPPLEMENT, PROSOURCE PLUS) liquid Take 30 mLs by mouth at bedtime. 09/06/19 10/06/19  Hughie Closs, MD  omeprazole (PRILOSEC) 20 MG capsule TAKE 1 CAPSULE BY MOUTH EVERY DAY 09/15/19   Wendall Stade, MD  ondansetron (ZOFRAN) 4 MG tablet Take 1 tablet (4 mg total) by mouth every 6 (six) hours as needed for nausea. 09/06/19   Maczis, Elmer Sow, PA-C  oxyCODONE (OXY IR/ROXICODONE) 5 MG immediate release tablet Take 1 tablet (5 mg total) by mouth every 6 (six) hours as needed for severe pain or breakthrough pain. 09/06/19   Maczis, Elmer Sow, PA-C  potassium chloride 20 MEQ TBCR Take 20 mEq by mouth daily. Take 2 pills (40 mEq) for a week then continue taking 20 mEq daily.Check potassium  level in a  week Patient taking differently: Take 20 mEq by mouth daily. 20 mEq, Oral, Daily, Take 2 pills (40 mEq) for a week then continue taking 20 mEq daily.Check potassium  level in a week 08/15/19   Burnadette Pop, MD  venlafaxine XR (EFFEXOR-XR) 37.5 MG 24 hr capsule TAKE IN ADDITION TO 75 MG TABLETS 09/20/19   [name], [name], MD  venlafaxine XR (EFFEXOR-XR) 75 MG 24 hr capsule TAKE 1 CAPSULE (75 MG TOTAL) BY MOUTH DAILY WITH BREAKFAST. 06/20/19   [name], [name], MD    Allergies:   Patient has no known allergies.   Social History   Socioeconomic History   Marital status: Divorced    Spouse name: Not on file   Number of  children: Not on file   Years of education: Not on file   Highest education level: Bachelor's degree (e.g., BA, AB, BS)  Occupational History   Not on file  Tobacco Use   Smoking status: Former    Current packs/day: 0.00    Average packs/day: 1 pack/day for 25.0 years (25.0 ttl pk-yrs)    Types: Cigarettes    Start date: 01/14/1975    Quit date: 01/14/2000    Years since quitting: 22.7   Smokeless tobacco: Never  Vaping Use   Vaping status: Never Used  Substance and Sexual Activity   Alcohol use: Yes    Alcohol/week: 3.0 standard drinks of alcohol    Types: 1 Cans of beer, 2 Shots of liquor per week    Comment: 4-5 drinks per week    Drug use: No   Sexual activity: Not Currently  Other Topics Concern   Not on file  Social History Narrative   2 kids, 4 grandkids, separated   Medical illustrator.   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   Right handed   Drinks caffeine   Single story home   Social Determinants of Health   Financial Resource Strain: Low Risk  (05/16/2022)   Overall Financial Resource Strain (CARDIA)    Difficulty of Paying Living Expenses: Not hard at all  Food Insecurity: No Food Insecurity (05/16/2022)   Hunger Vital Sign    Worried About Running Out of Food in the Last Year: Never true    Ran Out of Food in the Last Year: Never true   Transportation Needs: No Transportation Needs (05/16/2022)   PRAPARE - Administrator, Civil Service (Medical): No    Lack of Transportation (Non-Medical): No  Physical Activity: Insufficiently Active (05/16/2022)   Exercise Vital Sign    Days of Exercise per Week: 2 days    Minutes of Exercise per Session: 30 min  Stress: Stress Concern Present (05/16/2022)   [employer] of Occupational Health - Occupational Stress Questionnaire    Feeling of Stress : To some extent  Social Connections: Unknown (05/16/2022)   Social Connection and Isolation Panel [NHANES]    Frequency of Communication with Friends and Family: Three times a week    Frequency of Social Gatherings with Friends and Family: Three times a week    Attends Religious Services: Patient declined    Active Member of Clubs or Organizations: No    Attends Banker Meetings: Never    Marital Status: Divorced  Recent Concern: Social Connections - Socially Isolated (05/15/2022)   Social Connection and Isolation Panel [NHANES]    Frequency of Communication with Friends and Family: More than three times a week    Frequency of Social Gatherings with Friends and Family: More than three times a week    Attends Religious Services: Never    Database administrator or Organizations: No    Attends Engineer, structural: Never    Marital Status: Divorced     Family History:  The patient's family history includes Alcohol abuse in his mother; Cirrhosis in his mother; Coronary artery disease in his father; Depression in his sister; Heart disease in his father; Hypertension in his father; Pancreatic cancer in his mother; Peripheral vascular disease in his father.   ROS:   Please see the history of present illness.    ROS All other systems reviewed and are negative.   PHYSICAL EXAM:   VS:  BP 112/64  Pulse 100   Ht 5\' 6"  (1.676 m)   Wt 192 lb 6.4 oz (87.3 kg)   SpO2 96%   BMI 31.05 kg/m    Affect  appropriate Healthy:  appears stated age HEENT: normal Neck supple with no adenopathy JVP normal no bruits no thyromegaly Lungs clear with no wheezing and good diaphragmatic motion Heart:  S1/S2 no murmur, no rub, gallop or click PMI normal Abdomen: benighn, BS positve, no tenderness, no AAA no bruit.  No HSM or HJR Distal pulses intact with no bruits No edema Neuro non-focal LLE healed skin cancer still with some basal cells spots No muscular weakness   Wt Readings from Last 3 Encounters:  10/24/22 192 lb 6.4 oz (87.3 kg)  10/22/22 192 lb (87.1 kg)  05/20/22 194 lb 6.4 oz (88.2 kg)      Studies/Labs Reviewed:   EKG:  SR rate 86 Q lead 2 08/28/19 11/05/21 SR rate 76 ? Old IMI no acute changes   Recent Labs: 04/28/2022: ALT 28; BUN 20; Creatinine, Ser 1.58; Potassium 4.4; Sodium 140   Lipid Panel    Component Value Date/Time   CHOL 191 04/28/2022 1349   CHOL 174 03/26/2016 0736   TRIG (H) 04/28/2022 1349    467.0 Triglyceride is over 400; calculations on Lipids are invalid.   TRIG 130 12/08/2005 0741   HDL 64.10 04/28/2022 1349   HDL 40 03/26/2016 0736   CHOLHDL 3 04/28/2022 1349   VLDL 59.0 (H) 11/08/2018 0904   LDLCALC  05/10/2021 1534     Comment:     . LDL cholesterol not calculated. Triglyceride levels greater than 400 mg/dL invalidate calculated LDL results. . Reference range: <100 . Desirable range <100 mg/dL for primary prevention;   <70 mg/dL for patients with CHD or diabetic patients  with > or = 2 CHD risk factors. Marland Kitchen LDL-C is now calculated using the Martin-Hopkins  calculation, which is a validated novel method providing  better accuracy than the Friedewald equation in the  estimation of LDL-C.  Horald Pollen et al. Lenox Ahr. 4696;295(28): 2061-2068  (http://education.[facility].com/faq/FAQ164)    LDLDIRECT 92.0 04/28/2022 1349    Additional studies/ records that were reviewed today include:   Echocardiogram: 07/2019  1. Left ventricular  ejection fraction, by estimation, is 70 to 75%. The  left ventricle has hyperdynamic function. The left ventricle has no  regional wall motion abnormalities. Left ventricular diastolic parameters  are consistent with Grade I diastolic  dysfunction (impaired relaxation).   2. Right ventricular systolic function is normal. The right ventricular  size is normal. There is normal pulmonary artery systolic pressure.   3. The mitral valve is normal in structure. Trivial mitral valve  regurgitation. No evidence of mitral stenosis.   4. The aortic valve is tricuspid. Aortic valve regurgitation is not  visualized. No aortic stenosis is present.   5. The inferior vena cava is normal in size with greater than 50%  respiratory variability, suggesting right atrial pressure of 3 mmHg.      ASSESSMENT & PLAN:    CAD  - new onset angina Exertional and helped by nitroglycerin.  Shared decision making favor diagnostic cath to further risk stratify. Add Toprol 50 mg to meds  Shared Decision Making/Informed Consent The risks [stroke (1 in 1000), death (1 in 1000), kidney failure [usually temporary] (1 in 500), bleeding (1 in 200), allergic reaction [possibly serious] (1 in 200)], benefits (diagnostic support and management of coronary artery disease) and alternatives of a cardiac catheterization  were discussed in detail with Ms. Wanita Chamberlain and she is willing to proceed.   2. HLD - supposed to be on Lipitor 80 mg and Tricor.  LDL 92 on 04/28/22 Add Zetia 10 mg to get to goal   3. s/p colectomy with colostomy and reversal  -  03/07/20 f/u Dr Fredricka Bonine improved   4. HTN - Well controlled.  Continue current medications and low sodium Dash type diet.    5.  Basal cell :  LLE post Rx on observation now continue f/u oncology  Cath next week Lab called orders written Pre cath labs today Toprol 50 mg Zetia 10 mg     F/U post cath   Signed, Charlton Haws, MD  10/24/2022 8:01 AM    [facility]  [facility] [address] [place], [place], [place]  [address] Phone: [phone]; Fax: [phone]

## 2022-10-23 NOTE — H&P (View-Only) (Signed)
Cardiology Office Note    Date:  10/24/2022   ID:  [name], [name] 1945/03/02, MRN [id number]  PCP:  Excell Seltzer, MD  Cardiologist:  Dr. Eden Emms  Chief Complaint: hospital follow up  History of Present Illness:   Roy [name] is a 77 y.o. 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  Last Myoview in 2018 was nonischemic.  Admitted summer 2021 with large bowel obstruction with diverticulitis..  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  No chest pain Had colostomy reversal with Dr Fredricka Bonine 03/07/20 with no cardiac complications and held plavix  Has some spinal stenosis by MR 03/15/20 seeing Dr Allena Katz and getting PT  He has had relapse of left lower extremity basal cell carcinoma Rx with [facility] till June   He has had new onset angina a month ago. Exertional with mild ambulation. No rest pain Pain similar to prior angina. He also has had weakness, and new balance issues To see neurology at end of month Nitroglycerin has helped and he has been taking it almost daily. He is contemplating going to [place] for extended trip in January and may be in more remote environment   Past Medical History:  Diagnosis Date   Basal cell carcinoma    Bilateral Legs   CAD (coronary artery disease)    a.  stent to the RCA in 2001;  b.  Negative Myoview in January 2012;  c.  LHC 4/16:  dLM 20, small D1 50-70, D2 sub-total, oLCx 50-70, RCA stent ok, dPLB 90, EF 35-40% >> DES to D2   History of echocardiogram    a.  Echo 4/16: Mild LVH, EF 55-60%, grade 1 diastolic dysfunction, normal wall motion, MAC   HLD (hyperlipidemia)    HTN (hypertension)    MS (multiple sclerosis) (HCC)    OSA (obstructive sleep apnea)     no cpap     Pre-diabetes    Vertigo     Past Surgical History:  Procedure Laterality Date   BIOPSY  07/26/2019   Procedure: BIOPSY;  Surgeon: Iva Boop, MD;  Location[facility] ENDOSCOPY;  Service: Endoscopy;;   CATARACT EXTRACTION, BILATERAL     COLON RESECTION SIGMOID N/A 08/30/2019   Procedure: SIGMOID COLON RESECTION;  Surgeon: Berna Bue, MD;  Location: [facility] OR;  Service: General;  Laterality: N/A;   COLONOSCOPY N/A 07/26/2019   Procedure: COLONOSCOPY;  Surgeon: Iva Boop, MD;  Location: [facility] ENDOSCOPY;  Service: Endoscopy;  Laterality: N/A;   COLONOSCOPY     COLOSTOMY N/A 08/30/2019   Procedure: COLOSTOMY;  Surgeon: Berna Bue, MD;  Location: [facility] OR;  Service: General;  Laterality: N/A;   COLOSTOMY TAKEDOWN N/A 03/07/2020   Procedure: LAPAROSCOPIC COLOSTOMY REVERSAL, RIGID PROCTOSCOPY;  Surgeon: Berna Bue, MD;  Location: [facility] ORS;  Service: General;  Laterality: N/A;   CORONARY STENT PLACEMENT  2001    RCA   INCISIONAL HERNIA REPAIR N/A 09/24/2020   Procedure: LAPAROSCOPIC INCISIONAL HERNIA REPAIR WITH MESH;  Surgeon: Berna Bue, MD;  Location: [facility] ORS;  Service: General;  Laterality: N/A;   LEFT HEART CATHETERIZATION WITH CORONARY ANGIOGRAM N/A 02/13/2012   Procedure: LEFT HEART CATHETERIZATION WITH CORONARY ANGIOGRAM;  Surgeon: Rogue Pautler M [place], MD;  Location: [facility] CATH LAB;  Service: Cardiovascular;  Laterality: N/A;   LEFT HEART CATHETERIZATION WITH CORONARY ANGIOGRAM N/A 04/24/2014   Procedure: LEFT HEART CATHETERIZATION WITH CORONARY ANGIOGRAM;  Surgeon: Runell Gess, MD;  Location: [facility] CATH LAB;  Service: Cardiovascular;  Laterality: N/A;   LYSIS OF ADHESION N/A 03/07/2020   Procedure: LYSIS OF ADHESION;  Surgeon: Berna Bue, MD;  Location: [facility] ORS;  Service: General;  Laterality: N/A;   TONSILLECTOMY      Current Medications: Prior to Admission medications   Medication Sig Start Date End Date Taking? Authorizing Provider  acetaminophen (TYLENOL) 325 MG  tablet Take 2 tablets (650 mg total) by mouth every 6 (six) hours as needed. 09/06/19   Maczis, Elmer Sow, PA-C  carvedilol (COREG) 12.5 MG tablet TAKE 1 TABLET BY MOUTH TWICE A DAY 09/15/19   Wendall Stade, MD  clopidogrel (PLAVIX) 75 MG tablet TAKE 1 TABLET BY MOUTH EVERY DAY Patient taking differently: Take 75 mg by mouth daily.  10/12/18   Wendall Stade, MD  dicyclomine (BENTYL) 20 MG tablet Take 1 tablet (20 mg total) by mouth every 6 (six) hours as needed (abdominal pain). 07/28/19   Glade Lloyd, MD  feeding supplement, ENSURE ENLIVE, (ENSURE ENLIVE) LIQD Take 237 mLs by mouth 2 (two) times daily between meals. 09/06/19   Hughie Closs, MD  loperamide (IMODIUM) 2 MG capsule Take 1 capsule (2 mg total) by mouth 2 (two) times daily. 09/06/19   Maczis, Elmer Sow, PA-C  Magnesium Oxide 400 MG CAPS Take 1 capsule (400 mg total) by mouth daily. 08/15/19   Burnadette Pop, MD  mesalamine (LIALDA) 1.2 g EC tablet Take 4 tablets (4.8 g total) by mouth daily with breakfast. 08/16/19   Burnadette Pop, MD  nitroGLYCERIN (NITROSTAT) 0.4 MG SL tablet Place 1 tablet (0.4 mg total) under the tongue every 5 (five) minutes x 3 doses as needed for chest pain. 07/28/19   Glade Lloyd, MD  Nutritional Supplements (,FEEDING SUPPLEMENT, PROSOURCE PLUS) liquid Take 30 mLs by mouth at bedtime. 09/06/19 10/06/19  Hughie Closs, MD  omeprazole (PRILOSEC) 20 MG capsule TAKE 1 CAPSULE BY MOUTH EVERY DAY 09/15/19   Wendall Stade, MD  ondansetron (ZOFRAN) 4 MG tablet Take 1 tablet (4 mg total) by mouth every 6 (six) hours as needed for nausea. 09/06/19   Maczis, Elmer Sow, PA-C  oxyCODONE (OXY IR/ROXICODONE) 5 MG immediate release tablet Take 1 tablet (5 mg total) by mouth every 6 (six) hours as needed for severe pain or breakthrough pain. 09/06/19   Maczis, Elmer Sow, PA-C  potassium chloride 20 MEQ TBCR Take 20 mEq by mouth daily. Take 2 pills (40 mEq) for a week then continue taking 20 mEq daily.Check potassium  level in a  week Patient taking differently: Take 20 mEq by mouth daily. 20 mEq, Oral, Daily, Take 2 pills (40 mEq) for a week then continue taking 20 mEq daily.Check potassium  level in a week 08/15/19   Burnadette Pop, MD  venlafaxine XR (EFFEXOR-XR) 37.5 MG 24 hr capsule TAKE IN ADDITION TO 75 MG TABLETS 09/20/19   [name], [name], MD  venlafaxine XR (EFFEXOR-XR) 75 MG 24 hr capsule TAKE 1 CAPSULE (75 MG TOTAL) BY MOUTH DAILY WITH BREAKFAST. 06/20/19   [name], [name], MD    Allergies:   Patient has no known allergies.   Social History   Socioeconomic History   Marital status: Divorced    Spouse name: Not on file   Number of  children: Not on file   Years of education: Not on file   Highest education level: Bachelor's degree (e.g., BA, AB, BS)  Occupational History   Not on file  Tobacco Use   Smoking status: Former    Current packs/day: 0.00    Average packs/day: 1 pack/day for 25.0 years (25.0 ttl pk-yrs)    Types: Cigarettes    Start date: 01/14/1975    Quit date: 01/14/2000    Years since quitting: 22.7   Smokeless tobacco: Never  Vaping Use   Vaping status: Never Used  Substance and Sexual Activity   Alcohol use: Yes    Alcohol/week: 3.0 standard drinks of alcohol    Types: 1 Cans of beer, 2 Shots of liquor per week    Comment: 4-5 drinks per week    Drug use: No   Sexual activity: Not Currently  Other Topics Concern   Not on file  Social History Narrative   2 kids, 4 grandkids, separated   Medical illustrator.   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   Right handed   Drinks caffeine   Single story home   Social Determinants of Health   Financial Resource Strain: Low Risk  (05/16/2022)   Overall Financial Resource Strain (CARDIA)    Difficulty of Paying Living Expenses: Not hard at all  Food Insecurity: No Food Insecurity (05/16/2022)   Hunger Vital Sign    Worried About Running Out of Food in the Last Year: Never true    Ran Out of Food in the Last Year: Never true   Transportation Needs: No Transportation Needs (05/16/2022)   PRAPARE - Administrator, Civil Service (Medical): No    Lack of Transportation (Non-Medical): No  Physical Activity: Insufficiently Active (05/16/2022)   Exercise Vital Sign    Days of Exercise per Week: 2 days    Minutes of Exercise per Session: 30 min  Stress: Stress Concern Present (05/16/2022)   [employer] of Occupational Health - Occupational Stress Questionnaire    Feeling of Stress : To some extent  Social Connections: Unknown (05/16/2022)   Social Connection and Isolation Panel [NHANES]    Frequency of Communication with Friends and Family: Three times a week    Frequency of Social Gatherings with Friends and Family: Three times a week    Attends Religious Services: Patient declined    Active Member of Clubs or Organizations: No    Attends Banker Meetings: Never    Marital Status: Divorced  Recent Concern: Social Connections - Socially Isolated (05/15/2022)   Social Connection and Isolation Panel [NHANES]    Frequency of Communication with Friends and Family: More than three times a week    Frequency of Social Gatherings with Friends and Family: More than three times a week    Attends Religious Services: Never    Database administrator or Organizations: No    Attends Engineer, structural: Never    Marital Status: Divorced     Family History:  The patient's family history includes Alcohol abuse in his mother; Cirrhosis in his mother; Coronary artery disease in his father; Depression in his sister; Heart disease in his father; Hypertension in his father; Pancreatic cancer in his mother; Peripheral vascular disease in his father.   ROS:   Please see the history of present illness.    ROS All other systems reviewed and are negative.   PHYSICAL EXAM:   VS:  BP 112/64  Pulse 100   Ht 5\' 6"  (1.676 m)   Wt 192 lb 6.4 oz (87.3 kg)   SpO2 96%   BMI 31.05 kg/m    Affect  appropriate Healthy:  appears stated age HEENT: normal Neck supple with no adenopathy JVP normal no bruits no thyromegaly Lungs clear with no wheezing and good diaphragmatic motion Heart:  S1/S2 no murmur, no rub, gallop or click PMI normal Abdomen: benighn, BS positve, no tenderness, no AAA no bruit.  No HSM or HJR Distal pulses intact with no bruits No edema Neuro non-focal LLE healed skin cancer still with some basal cells spots No muscular weakness   Wt Readings from Last 3 Encounters:  10/24/22 192 lb 6.4 oz (87.3 kg)  10/22/22 192 lb (87.1 kg)  05/20/22 194 lb 6.4 oz (88.2 kg)      Studies/Labs Reviewed:   EKG:  SR rate 86 Q lead 2 08/28/19 11/05/21 SR rate 76 ? Old IMI no acute changes   Recent Labs: 04/28/2022: ALT 28; BUN 20; Creatinine, Ser 1.58; Potassium 4.4; Sodium 140   Lipid Panel    Component Value Date/Time   CHOL 191 04/28/2022 1349   CHOL 174 03/26/2016 0736   TRIG (H) 04/28/2022 1349    467.0 Triglyceride is over 400; calculations on Lipids are invalid.   TRIG 130 12/08/2005 0741   HDL 64.10 04/28/2022 1349   HDL 40 03/26/2016 0736   CHOLHDL 3 04/28/2022 1349   VLDL 59.0 (H) 11/08/2018 0904   LDLCALC  05/10/2021 1534     Comment:     . LDL cholesterol not calculated. Triglyceride levels greater than 400 mg/dL invalidate calculated LDL results. . Reference range: <100 . Desirable range <100 mg/dL for primary prevention;   <70 mg/dL for patients with CHD or diabetic patients  with > or = 2 CHD risk factors. Marland Kitchen LDL-C is now calculated using the Martin-Hopkins  calculation, which is a validated novel method providing  better accuracy than the Friedewald equation in the  estimation of LDL-C.  Horald Pollen et al. Lenox Ahr. 4696;295(28): 2061-2068  (http://education.QuestDiagnostics.com/faq/FAQ164)    LDLDIRECT 92.0 04/28/2022 1349    Additional studies/ records that were reviewed today include:   Echocardiogram: 07/2019  1. Left ventricular  ejection fraction, by estimation, is 70 to 75%. The  left ventricle has hyperdynamic function. The left ventricle has no  regional wall motion abnormalities. Left ventricular diastolic parameters  are consistent with Grade I diastolic  dysfunction (impaired relaxation).   2. Right ventricular systolic function is normal. The right ventricular  size is normal. There is normal pulmonary artery systolic pressure.   3. The mitral valve is normal in structure. Trivial mitral valve  regurgitation. No evidence of mitral stenosis.   4. The aortic valve is tricuspid. Aortic valve regurgitation is not  visualized. No aortic stenosis is present.   5. The inferior vena cava is normal in size with greater than 50%  respiratory variability, suggesting right atrial pressure of 3 mmHg.      ASSESSMENT & PLAN:    CAD  - new onset angina Exertional and helped by nitroglycerin.  Shared decision making favor diagnostic cath to further risk stratify. Add Toprol 50 mg to meds  Shared Decision Making/Informed Consent The risks [stroke (1 in 1000), death (1 in 1000), kidney failure [usually temporary] (1 in 500), bleeding (1 in 200), allergic reaction [possibly serious] (1 in 200)], benefits (diagnostic support and management of coronary artery disease) and alternatives of a cardiac catheterization  were discussed in detail with Ms. Wanita Chamberlain and she is willing to proceed.   2. HLD - supposed to be on Lipitor 80 mg and Tricor.  LDL 92 on 04/28/22 Add Zetia 10 mg to get to goal   3. s/p colectomy with colostomy and reversal  -  03/07/20 f/u Dr Fredricka Bonine improved   4. HTN - Well controlled.  Continue current medications and low sodium Dash type diet.    5.  Basal cell :  LLE post Rx on observation now continue f/u oncology  Cath next week Lab called orders written Pre cath labs today Toprol 50 mg Zetia 10 mg     F/U post cath   Signed, Charlton Haws, MD  10/24/2022 8:01 AM    [facility]  [facility] [address] [place], [place], [place]  [address] Phone: [phone]; Fax: [phone]

## 2022-10-24 ENCOUNTER — Ambulatory Visit: Payer: Medicare HMO | Attending: Cardiovascular Disease | Admitting: Cardiovascular Disease

## 2022-10-24 ENCOUNTER — Other Ambulatory Visit: Payer: Self-pay | Admitting: Cardiovascular Disease

## 2022-10-24 ENCOUNTER — Encounter: Payer: Self-pay | Admitting: Cardiovascular Disease

## 2022-10-24 VITALS — BP 112/64 | HR 100 | Ht 66.0 in | Wt 192.4 lb

## 2022-10-24 DIAGNOSIS — E782 Mixed hyperlipidemia: Secondary | ICD-10-CM

## 2022-10-24 DIAGNOSIS — I209 Angina pectoris, unspecified: Secondary | ICD-10-CM

## 2022-10-24 DIAGNOSIS — I10 Essential (primary) hypertension: Secondary | ICD-10-CM | POA: Diagnosis not present

## 2022-10-24 LAB — CBC WITH DIFFERENTIAL/PLATELET
Basophils Absolute: 0 x10E3/uL (ref 0.0–0.2)
Basos: 1 %
EOS (ABSOLUTE): 0.1 x10E3/uL (ref 0.0–0.4)
Eos: 2 %
Hematocrit: 47.1 % (ref 37.5–51.0)
Hemoglobin: 15.7 g/dL (ref 13.0–17.7)
Immature Grans (Abs): 0 x10E3/uL (ref 0.0–0.1)
Immature Granulocytes: 0 %
Lymphocytes Absolute: 1.1 x10E3/uL (ref 0.7–3.1)
Lymphs: 15 %
MCH: 31.4 pg (ref 26.6–33.0)
MCHC: 33.3 g/dL (ref 31.5–35.7)
MCV: 94 fL (ref 79–97)
Monocytes Absolute: 0.4 x10E3/uL (ref 0.1–0.9)
Monocytes: 6 %
Neutrophils Absolute: 5.5 x10E3/uL (ref 1.4–7.0)
Neutrophils: 76 %
Platelets: 217 x10E3/uL (ref 150–450)
RBC: 5 x10E6/uL (ref 4.14–5.80)
RDW: 12.3 % (ref 11.6–15.4)
WBC: 7.2 x10E3/uL (ref 3.4–10.8)

## 2022-10-24 LAB — BASIC METABOLIC PANEL WITH GFR
BUN/Creatinine Ratio: 11 (ref 10–24)
BUN: 14 mg/dL (ref 8–27)
CO2: 22 mmol/L (ref 20–29)
Calcium: 9.7 mg/dL (ref 8.6–10.2)
Chloride: 102 mmol/L (ref 96–106)
Creatinine, Ser: 1.29 mg/dL — ABNORMAL HIGH (ref 0.76–1.27)
Glucose: 216 mg/dL — ABNORMAL HIGH (ref 70–99)
Potassium: 4.4 mmol/L (ref 3.5–5.2)
Sodium: 139 mmol/L (ref 134–144)
eGFR: 57 mL/min/1.73 — ABNORMAL LOW (ref >59)

## 2022-10-24 MED ORDER — NITROGLYCERIN 0.4 MG SL SUBL: 0.4000 mg | SUBLINGUAL_TABLET | SUBLINGUAL | 5 refills | Status: AC | PRN

## 2022-10-24 MED ORDER — METOPROLOL SUCCINATE ER 50 MG PO TB24
50.0000 mg | ORAL_TABLET | Freq: Every day | ORAL | 3 refills | Status: AC
Start: 2022-10-24 — End: ?

## 2022-10-24 MED ORDER — EZETIMIBE 10 MG PO TABS
10.0000 mg | ORAL_TABLET | Freq: Every day | ORAL | 3 refills | Status: AC
Start: 2022-10-24 — End: ?

## 2022-10-24 NOTE — Patient Instructions (Signed)
Medication Instructions:  Your physician has recommended you make the following change in your medication:  1- START Metoprolol 50 mg by mouth daily 2- START Zetia 10 mg by mouth dialy   *If you need a refill on your cardiac medications before your next appointment, please call your pharmacy*  Lab Work: Your physician recommends that you have lab work today- BMET and CBC  If you have labs (blood work) drawn today and your tests are completely normal, you will receive your results only by: [employer] (if you have MyChart) OR A paper copy in the mail If you have any lab test that is abnormal or we need to change your treatment, we will call you to review the results.  Testing/Procedures: Your physician has requested that you have a cardiac catheterization. Cardiac catheterization is used to diagnose and/or treat various heart conditions. Doctors may recommend this procedure for a number of different reasons. The most common reason is to evaluate chest pain. Chest pain can be a symptom of coronary artery disease (CAD), and cardiac catheterization can show whether plaque is narrowing or blocking your heart's arteries. This procedure is also used to evaluate the valves, as well as measure the blood flow and oxygen levels in different parts of your heart. For further information please visit [email or web address] Please follow instruction sheet, as given.   Follow-Up: At [facility], you and your health needs are our priority.  As part of our continuing mission to provide you with exceptional heart care, we have created designated Provider Care Teams.  These Care Teams include your primary Cardiologist (physician) and Advanced Practice Providers (APPs -  Physician Assistants and Nurse Practitioners) who all work together to provide you with the care you need, when you need it.  We recommend signing up for the patient portal called "MyChart".  Sign up information is provided on this  After Visit Summary.  MyChart is used to connect with patients for Virtual Visits (Telemedicine).  Patients are able to view lab/test results, encounter notes, upcoming appointments, etc.  Non-urgent messages can be sent to your provider as well.   To learn more about what you can do with MyChart, go to [email or web address].    Your next appointment:   2 week(s)  Provider:   Jari Favre, PA-C, Ronie Spies, PA-C, Robin Searing, NP, Jacolyn Reedy, PA-C, Eligha Bridegroom, NP, Tereso Newcomer, PA-C, or Perlie Gold, PA-C     Then, Charlton Haws, MD will plan to see you again in 6 month(s).    Other Instructions  [facility] A DEPT OF [place]. [facility] AT [facility] [address] [place], [place] [address] [place] [address] Dept: [phone] Loc: [phone]  Roy [name]  10/24/2022  You are scheduled for a Cardiac Catheterization on Tuesday, October 15 with Dr. Alverda Skeans.  1. Please arrive at the [facility] (Main Entrance A) at [facility]: [address] [place], [place] [address] at 5:30 AM (This time is 2 hour(s) before your procedure to ensure your preparation). Free valet parking service is available. You will check in at ADMITTING. The support person will be asked to wait in the waiting room.  It is OK to have someone drop you off and come back when you are ready to be discharged.    Special note: Every effort is made to have your procedure done on time. Please understand that emergencies sometimes delay scheduled procedures.  2. Diet: Do not eat solid  foods after midnight.  The patient may have clear liquids until 5am upon the day of the procedure.  3. Labs: You will need to have blood drawn on Friday, October 11 at [facility]. [address], [place]  Open: 7:30am - 5pm    Phone: [phone]. You do not need to be fasting.  4. Medication instructions in preparation for your  procedure:   Contrast Allergy: No  On the morning of your procedure, take your Aspirin 81 mg and any morning medicines NOT listed above.  You may use sips of water.  5. Plan to go home the same day, you will only stay overnight if medically necessary. 6. Bring a current list of your medications and current insurance cards. 7. You MUST have a responsible person to drive you home. 8. Someone MUST be with you the first 24 hours after you arrive home or your discharge will be delayed. 9. Please wear clothes that are easy to get on and off and wear slip-on shoes.  Thank you for allowing [place] to care for you!   -- [facility]

## 2022-10-27 ENCOUNTER — Telehealth: Payer: Self-pay | Admitting: *Deleted

## 2022-10-27 NOTE — Telephone Encounter (Addendum)
Cardiac Catheterization scheduled at [facility] for: Tuesday October 28, 2022 7:30 AM Arrival time [facility] Main Entrance A at: 5:30 AM  Nothing to eat after midnight prior to procedure, clear liquids until 5 AM day of procedure  Medication instructions: -Usual morning medications can be taken with sips of water including aspirin 81 mg.  Plan to go home the same day, you will only stay overnight if medically necessary.  You must have responsible adult to drive you home.  Someone must be with you the first 24 hours after you arrive home.  Reviewed procedure instructions with patient.

## 2022-10-28 ENCOUNTER — Ambulatory Visit (HOSPITAL_COMMUNITY)
Admission: RE | Admit: 2022-10-28 | Discharge: 2022-10-28 | Disposition: A | Discharge status: Home / Self Care | Payer: Medicare HMO | Source: Ambulatory Visit | Attending: Internal Medicine | Admitting: Internal Medicine

## 2022-10-28 ENCOUNTER — Encounter (HOSPITAL_COMMUNITY)
Admission: RE | Disposition: A | Discharge status: Home / Self Care | Payer: Self-pay | Source: Ambulatory Visit | Attending: Internal Medicine

## 2022-10-28 ENCOUNTER — Other Ambulatory Visit: Payer: Self-pay

## 2022-10-28 DIAGNOSIS — Z9049 Acquired absence of other specified parts of digestive tract: Secondary | ICD-10-CM | POA: Diagnosis not present

## 2022-10-28 DIAGNOSIS — R7303 Prediabetes: Secondary | ICD-10-CM | POA: Diagnosis not present

## 2022-10-28 DIAGNOSIS — E785 Hyperlipidemia, unspecified: Secondary | ICD-10-CM | POA: Insufficient documentation

## 2022-10-28 DIAGNOSIS — Z85828 Personal history of other malignant neoplasm of skin: Secondary | ICD-10-CM | POA: Diagnosis not present

## 2022-10-28 DIAGNOSIS — I252 Old myocardial infarction: Secondary | ICD-10-CM | POA: Diagnosis not present

## 2022-10-28 DIAGNOSIS — Z955 Presence of coronary angioplasty implant and graft: Secondary | ICD-10-CM | POA: Diagnosis not present

## 2022-10-28 DIAGNOSIS — Z87891 Personal history of nicotine dependence: Secondary | ICD-10-CM | POA: Diagnosis not present

## 2022-10-28 DIAGNOSIS — I209 Angina pectoris, unspecified: Secondary | ICD-10-CM

## 2022-10-28 DIAGNOSIS — I25119 Atherosclerotic heart disease of native coronary artery with unspecified angina pectoris: Secondary | ICD-10-CM | POA: Insufficient documentation

## 2022-10-28 DIAGNOSIS — I10 Essential (primary) hypertension: Secondary | ICD-10-CM | POA: Diagnosis not present

## 2022-10-28 DIAGNOSIS — I2510 Atherosclerotic heart disease of native coronary artery without angina pectoris: Secondary | ICD-10-CM | POA: Diagnosis not present

## 2022-10-28 DIAGNOSIS — Z79899 Other long term (current) drug therapy: Secondary | ICD-10-CM | POA: Diagnosis not present

## 2022-10-28 HISTORY — PX: LEFT HEART CATH AND CORONARY ANGIOGRAPHY: CATH118249

## 2022-10-28 SURGERY — LEFT HEART CATH AND CORONARY ANGIOGRAPHY
Anesthesia: LOCAL

## 2022-10-28 MED ORDER — HEPARIN SODIUM (PORCINE) 1000 UNIT/ML IJ SOLN
INTRAMUSCULAR | Status: DC | PRN
  Administered 2022-10-28: 5000 [IU] via INTRAVENOUS

## 2022-10-28 MED ORDER — LIDOCAINE HCL (PF) 1 % IJ SOLN
INTRAMUSCULAR | Status: AC
  Filled 2022-10-28: qty 30

## 2022-10-28 MED ORDER — METOPROLOL SUCCINATE ER 25 MG PO TB24
50.0000 mg | ORAL_TABLET | Freq: Once | ORAL | Status: AC
  Administered 2022-10-28: 50 mg via ORAL
  Filled 2022-10-28: qty 2

## 2022-10-28 MED ORDER — SODIUM CHLORIDE 0.9 % IV SOLN: 250.0000 mL | INTRAVENOUS | Status: DC | PRN

## 2022-10-28 MED ORDER — SODIUM CHLORIDE 0.9 % WEIGHT BASED INFUSION: 1.0000 mL/kg/h | INTRAVENOUS | Status: DC

## 2022-10-28 MED ORDER — VERAPAMIL HCL 2.5 MG/ML IV SOLN
INTRAVENOUS | Status: DC | PRN
  Administered 2022-10-28: 10 mL via INTRA_ARTERIAL

## 2022-10-28 MED ORDER — ASPIRIN 81 MG PO CHEW: 81.0000 mg | CHEWABLE_TABLET | ORAL | Status: DC

## 2022-10-28 MED ORDER — FENTANYL CITRATE (PF) 100 MCG/2ML IJ SOLN
INTRAMUSCULAR | Status: AC
  Filled 2022-10-28: qty 2

## 2022-10-28 MED ORDER — ONDANSETRON HCL 4 MG/2ML IJ SOLN: 4.0000 mg | Freq: Four times a day (QID) | INTRAMUSCULAR | Status: DC | PRN

## 2022-10-28 MED ORDER — HEPARIN SODIUM (PORCINE) 1000 UNIT/ML IJ SOLN
INTRAMUSCULAR | Status: AC
  Filled 2022-10-28: qty 10

## 2022-10-28 MED ORDER — MIDAZOLAM HCL 2 MG/2ML IJ SOLN
INTRAMUSCULAR | Status: AC
  Filled 2022-10-28: qty 2

## 2022-10-28 MED ORDER — SODIUM CHLORIDE 0.9 % WEIGHT BASED INFUSION
3.0000 mL/kg/h | INTRAVENOUS | Status: DC
  Administered 2022-10-28: 3 mL/kg/h via INTRAVENOUS

## 2022-10-28 MED ORDER — SODIUM CHLORIDE 0.9 % IV SOLN: INTRAVENOUS | Status: DC

## 2022-10-28 MED ORDER — LIDOCAINE HCL (PF) 1 % IJ SOLN
INTRAMUSCULAR | Status: DC | PRN
  Administered 2022-10-28: 2 mL

## 2022-10-28 MED ORDER — FENTANYL CITRATE (PF) 100 MCG/2ML IJ SOLN
INTRAMUSCULAR | Status: DC | PRN
  Administered 2022-10-28: 25 ug via INTRAVENOUS

## 2022-10-28 MED ORDER — HYDRALAZINE HCL 20 MG/ML IJ SOLN: 10.0000 mg | INTRAMUSCULAR | Status: DC | PRN

## 2022-10-28 MED ORDER — MIDAZOLAM HCL 2 MG/2ML IJ SOLN
INTRAMUSCULAR | Status: DC | PRN
  Administered 2022-10-28: 1 mg via INTRAVENOUS

## 2022-10-28 MED ORDER — ACETAMINOPHEN 325 MG PO TABS: 650.0000 mg | ORAL_TABLET | ORAL | Status: DC | PRN

## 2022-10-28 MED ORDER — IOHEXOL 350 MG/ML SOLN
INTRAVENOUS | Status: DC | PRN
  Administered 2022-10-28: 40 mL

## 2022-10-28 MED ORDER — SODIUM CHLORIDE 0.9% FLUSH: 3.0000 mL | Freq: Two times a day (BID) | INTRAVENOUS | Status: DC

## 2022-10-28 MED ORDER — SODIUM CHLORIDE 0.9% FLUSH: 3.0000 mL | INTRAVENOUS | Status: DC | PRN

## 2022-10-28 MED ORDER — LABETALOL HCL 5 MG/ML IV SOLN: 10.0000 mg | INTRAVENOUS | Status: DC | PRN

## 2022-10-28 SURGICAL SUPPLY — 9 items
CATH INFINITI 5FR ANG PIGTAIL (CATHETERS) IMPLANT
CATH INFINITI AMBI 6FR TG (CATHETERS) IMPLANT
DEVICE RAD COMP TR BAND LRG (VASCULAR PRODUCTS) IMPLANT
GLIDESHEATH SLEND SS 6F .021 (SHEATH) IMPLANT
GUIDEWIRE INQWIRE 1.5J.035X260 (WIRE) IMPLANT
INQWIRE 1.5J .035X260CM (WIRE) ×1
KIT SYRINGE INJ CVI SPIKEX1 (MISCELLANEOUS) IMPLANT
PACK CARDIAC CATHETERIZATION (CUSTOM PROCEDURE TRAY) ×1 IMPLANT
SET ATX-X65L (MISCELLANEOUS) IMPLANT

## 2022-10-28 NOTE — Interval H&P Note (Signed)
History and Physical Interval Note:  10/28/2022 6:55 AM  Roy [name]  has presented today for surgery, with the diagnosis of chest pain.  The various methods of treatment have been discussed with the patient and family. After consideration of risks, benefits and other options for treatment, the patient has consented to  Procedure(s): LEFT HEART CATH AND CORONARY ANGIOGRAPHY (N/A) as a surgical intervention.  The patient's history has been reviewed, patient examined, no change in status, stable for surgery.  I have reviewed the patient's chart and labs.  Questions were answered to the patient's satisfaction.     [name]

## 2022-10-28 NOTE — Discharge Instructions (Signed)

## 2022-10-29 ENCOUNTER — Encounter (HOSPITAL_COMMUNITY): Payer: Self-pay | Admitting: Internal Medicine

## 2022-11-10 ENCOUNTER — Ambulatory Visit: Payer: Medicare HMO | Attending: Cardiology | Admitting: Cardiology

## 2022-11-10 ENCOUNTER — Encounter: Payer: Self-pay | Admitting: Cardiology

## 2022-11-10 VITALS — BP 120/88 | HR 100 | Ht 66.0 in | Wt 190.4 lb

## 2022-11-10 DIAGNOSIS — E1159 Type 2 diabetes mellitus with other circulatory complications: Secondary | ICD-10-CM

## 2022-11-10 DIAGNOSIS — I152 Hypertension secondary to endocrine disorders: Secondary | ICD-10-CM | POA: Diagnosis not present

## 2022-11-10 DIAGNOSIS — E785 Hyperlipidemia, unspecified: Secondary | ICD-10-CM | POA: Diagnosis not present

## 2022-11-10 DIAGNOSIS — E1169 Type 2 diabetes mellitus with other specified complication: Secondary | ICD-10-CM

## 2022-11-10 DIAGNOSIS — R0789 Other chest pain: Secondary | ICD-10-CM | POA: Diagnosis not present

## 2022-11-10 DIAGNOSIS — I2510 Atherosclerotic heart disease of native coronary artery without angina pectoris: Secondary | ICD-10-CM | POA: Diagnosis not present

## 2022-11-10 MED ORDER — ISOSORBIDE MONONITRATE ER 30 MG PO TB24: 30.0000 mg | ORAL_TABLET | Freq: Every day | ORAL | 3 refills | Status: DC

## 2022-11-10 NOTE — Patient Instructions (Signed)
Medication Instructions:  Your physician has recommended you make the following change in your medication:   START Isosorbide (Imdur) 30 mg taking 1 daily  *If you need a refill on your cardiac medications before your next appointment, please call your pharmacy*   Lab Work: None ordere  If you have labs (blood work) drawn today and your tests are completely normal, you will receive your results only by: MyChart Message (if you have MyChart) OR A paper copy in the mail If you have any lab test that is abnormal or we need to change your treatment, we will call you to review the results.   Testing/Procedures: None ordered   Follow-Up: At [facility], you and your health needs are our priority.  As part of our continuing mission to provide you with exceptional heart care, we have created designated Provider Care Teams.  These Care Teams include your primary Cardiologist (physician) and Advanced Practice Providers (APPs -  Physician Assistants and Nurse Practitioners) who all work together to provide you with the care you need, when you need it.  We recommend signing up for the patient portal called "MyChart".  Sign up information is provided on this After Visit Summary.  MyChart is used to connect with patients for Virtual Visits (Telemedicine).  Patients are able to view lab/test results, encounter notes, upcoming appointments, etc.  Non-urgent messages can be sent to your provider as well.   To learn more about what you can do with MyChart, go to [email or web address].    Your next appointment:   1 month  Provider:   Charlton Haws, MD  Perlie Gold, PA-C   Other Instructions

## 2022-11-10 NOTE — Progress Notes (Signed)
Cardiology Office Note:   Date:  11/10/2022  ID:  Roy [name], Roy [name] [birth year], MRN [id number] PCP: Excell Seltzer, MD  [facility] Providers Cardiologist:  Charlton Haws, MD    History of Present Illness:   Discussed the use of AI scribe software for clinical note transcription with the patient, who gave verbal consent to proceed.  History of Present Illness   The patient, with a history of coronary artery disease and multiple stents, presented to visit with Dr. Eden Emms earlier this month with chest discomfort. Given hx, LHC was arranged. This found patent stents (widely patent first diag stent, distal RCA stent patient with moderate in-stent restenosis) and only a small vessel 90% stenosis of ostium of diagonal (felt that PCI would jeopardize LAD).   Since this LHC, patient reports largely unchanged chest pain. The pain was described as starting in central chest, radiating halfway down both arms, typically occurring at rest, particularly after getting up at night. The discomfort was noted to be less severe than prior to stent placement, but similar in nature. Nitroglycerin was used twice in the past month for relief, with each episode lasting about 15 minutes before resolution. He denies any occurrence with exertion, has been able to walk without angina or dyspnea.   The patient also reported balance problems and instability, potentially related to multiple sclerosis.    ROS: Positive for chest pain. Negative for shortness of breath, lower extremity edema, fatigue, palpitations, melena, hematuria, hemoptysis, diaphoresis, weakness, presyncope, syncope, orthopnea, and PND.   Studies Reviewed:   10/28/22 LHC    1st Diag-1 lesion is 90% stenosed.   Dist RCA lesion is 20% stenosed.   Previously placed 1st Diag-2 stent of unknown type is  widely patent.   1.  Mild LAD disease. 2.  Widely patent first diagonal stent; the ostium of the diagonal has a 90% stenosis.  PCI of this area  would jeopardize the LAD.  Medical therapy should be pursued. 3.  Right coronary artery disease with mild diffuse disease; the distal right coronary artery stent is patent with moderate in-stent restenosis. 4.  LVEDP of 17 mmHg.   Recommendation: Medical therapy.    Risk Assessment/Calculations:              Physical Exam:   VS:  BP 120/88   Pulse 100   Ht 5\' 6"  (1.676 m)   Wt 190 lb 6.4 oz (86.4 kg)   SpO2 95%   BMI 30.73 kg/m    Wt Readings from Last 3 Encounters:  11/10/22 190 lb 6.4 oz (86.4 kg)  10/28/22 191 lb (86.6 kg)  10/24/22 192 lb 6.4 oz (87.3 kg)     Physical Exam Vitals reviewed.  Constitutional:      Appearance: Normal appearance.  HENT:     Head: Normocephalic.  Eyes:     Pupils: Pupils are equal, round, and reactive to light.  Cardiovascular:     Rate and Rhythm: Normal rate and regular rhythm.     Pulses: Normal pulses.     Heart sounds: Normal heart sounds.  Pulmonary:     Effort: Pulmonary effort is normal.     Breath sounds: Normal breath sounds.  Abdominal:     General: Abdomen is flat.     Palpations: Abdomen is soft.  Musculoskeletal:     Right lower leg: No edema.     Left lower leg: No edema.  Skin:    General: Skin is warm and dry.  Capillary Refill: Capillary refill takes less than 2 seconds.  Neurological:     General: No focal deficit present.     Mental Status: He is alert and oriented to person, place, and time.  Psychiatric:        Mood and Affect: Mood normal.        Behavior: Behavior normal.        Thought Content: Thought content normal.        Judgment: Judgment normal.     ASSESSMENT AND PLAN:     Assessment and Plan    Coronary Artery Disease Recent catheterization due to chest pain concerning for accelerating angina revealed patent stents and stenosis of the ostium of the first diagonal proximal to stent (not intervened on due to location, risk of LAD occlusion with PCI). Chest discomfort described as less  severe than pre-stent pain but similar, radiating down both arms, primarily at night, and relieved by nitroglycerin. -Add Isosorbide Mononitrate (Imdur) 30mg  daily for long-acting nitrate anti-anginal therapy. -Continue Aspirin, Metoprolol Succinate (Toprol XL) 50mg , and monitor for potential adjustment based on response to Imdur. -Consider Ranexa as a potential addition in the future if needed. -Advise patient to call 911 if he has chest pain that does not improve with nitroglycerin.  Hyperlipidemia Cholesterol was 92 as of April 2024, goal is 70 or less. -Continue Lipitor 80mg , Zetia 10mg , and Tricor 145mg . -Recheck cholesterol in about a month.  Blood Pressure Management Current BP is stable (120/88), but potential for changes with addition of Imdur. -Patient to monitor BP at home, aiming for systolic between 846-962. -Notify provider if BP consistently less than 100 or symptoms of dizziness or worsened balance occur.  Follow-up in about a month to assess response to Imdur, recheck cholesterol, and monitor blood pressure.           Signed, Perlie Gold, PA-C

## 2022-11-12 ENCOUNTER — Telehealth: Payer: Self-pay | Admitting: Neurology

## 2022-11-12 NOTE — Telephone Encounter (Signed)
My chart confirmation for 11/13/22 appt

## 2022-11-12 NOTE — Progress Notes (Unsigned)
[facility]  PATIENT: Roy [name] DOB: Jun 11, 1945  REFERRING DOCTOR OR PCP: Kerby Nora, MD SOURCE: Patient, notes from primary care, imaging and lab reports, MRI studies personally reviewed and detailed below.  _________________________________   HISTORICAL  CHIEF COMPLAINT:  Chief Complaint  Patient presents with   New Patient (Initial Visit)    Pt in room 11. New patient here for MS and poor balance. Pt said last fall 1 month ago, patient said she drags his feet at homes at times. Pt said if he turns his head too fast he can lose his balance. Patient saw eye doctor 3 weeks ago goes to [facility] eye care.     HISTORY OF PRESENT ILLNESS:  I had the pleasure of seeing patient, Roy [name], at [facility] for neurologic consultation regarding his history of multiple sclerosis.  He is a 77 year old man who was diagnosed in 72 at age 20.   He was in college at the time.  He woke up with dizziness and visual changes.  He did better with an eye patch.   He returned to [facility] and had an LP performed, leading to a diagnosis of MS.  He was started on ACTH and improved in a couple weeks.   He had similar symptoms in 1971 and had a course of ACTH.  Symptoms cleared up and he did well with no further recurrence.     In 0222, he had diverticulitis requiring surgery (colostomy).   He had issues with balance but no other neuro symptoms.  His Gi symptoms are better but the neurologic continued.   Due to the unsteady gait, she was referred to Dr. Allena Katz of [facility] in 2022..  She ordered an MRI of the brain that reconfirmed the white matter changes.  There was mild progression compared to the 2011 MRI (I concur, see below) additionally an MRI of the lumbar spine was performed showing multilevel degenerative changes.  She also did a NCV/EMG study which showed a chronic L5 radiculopathy bilaterally and no evidence of  polyneuropathy.  Currently, gait is off balanced.  He does not veer.  Turns are less steady.   He has difficulty on stairs - had a lot of problems at a recent football game.    He does not experience vertigo.    He has coronary stents and has been experiencing angina.  Recent Cath showed no new blockage.      Imaging: MRI of the brain 04/05/2020 (compared to MRI from 09/25/2009) showed multiple T2/FLAIR hyperintense foci in the periventricular, subcortical and deep white matter.  No foci are noted in the infratentorial white matter.  None of the foci appear to be acute and they do not enhance.  There has been some progression compared to the MRI from 2011.    This finding is nonspecific and could be due to chronic microvascular ischemic change, demyelination or combination of these.  MRI of the lumbar spine 03/15/2020 shows disc protrusion to the left at L2-L3 causing mild spinal stenosis and left greater than right lateral recess stenosis with potential for left L3 nerve root compression.  At L3-L4, degenerative changes causing mild foraminal and lateral recess stenosis but no nerve root compression.  At L4-L5, there is mild spinal stenosis and right greater than left mild to moderate foraminal narrowing and moderate left greater than right lateral recess stenosis.   REVIEW OF SYSTEMS: Constitutional: No fevers, chills, sweats, or change in appetite Eyes: No visual changes, double vision,  eye pain Ear, nose and throat: No hearing loss, ear pain, nasal congestion, sore throat Cardiovascular: History of CAD/stents.  He has had some angina. Respiratory:  No shortness of breath at rest or with exertion.   No wheezes GastrointestinaI: No nausea, vomiting, diarrhea, abdominal pain, fecal incontinence Genitourinary:  No dysuria, urinary retention or frequency.  No nocturia. Musculoskeletal:  No neck pain, back pain Integumentary: Has some skin changes in the legs Neurological: as above Psychiatric: No  depression at this time.  No anxiety Endocrine: No palpitations, diaphoresis, change in appetite, change in weigh or increased thirst Hematologic/Lymphatic:  No anemia, purpura, petechiae. Allergic/Immunologic: No itchy/runny eyes, nasal congestion, recent allergic reactions, rashes  ALLERGIES: No Known Allergies  HOME MEDICATIONS:  Current Outpatient Medications:    aspirin EC 81 MG tablet, Take 81 mg by mouth daily. Swallow whole., Disp: , Rfl:    atorvastatin (LIPITOR) 80 MG tablet, TAKE 1 TABLET BY MOUTH EVERY DAY, Disp: 90 tablet, Rfl: 3   ezetimibe (ZETIA) 10 MG tablet, Take 1 tablet (10 mg total) by mouth daily., Disp: 90 tablet, Rfl: 3   feeding supplement (ENSURE IMMUNE HEALTH) LIQD, Take 237 mLs by mouth in the morning., Disp: , Rfl:    fenofibrate (TRICOR) 145 MG tablet, TAKE 1 TABLET BY MOUTH EVERY DAY, Disp: 90 tablet, Rfl: 3   fluocinonide (LIDEX) 0.05 % external solution, Apply 1 application topically at bedtime as needed (psoriasis)., Disp: , Rfl:    hydrocortisone cream 1 %, Apply 1 application topically 2 (two) times daily as needed for itching., Disp: , Rfl:    isosorbide mononitrate (IMDUR) 30 MG 24 hr tablet, Take 1 tablet (30 mg total) by mouth daily., Disp: 90 tablet, Rfl: 3   Magnesium 250 MG TABS, Take 250 mg by mouth daily., Disp: , Rfl:    metoprolol succinate (TOPROL-XL) 50 MG 24 hr tablet, Take 1 tablet (50 mg total) by mouth daily. Take with or immediately following a meal., Disp: 90 tablet, Rfl: 3   nitroGLYCERIN (NITROSTAT) 0.4 MG SL tablet, Place 1 tablet (0.4 mg total) under the tongue every 5 (five) minutes x 3 doses as needed for chest pain., Disp: 25 tablet, Rfl: 5   omeprazole (PRILOSEC) 20 MG capsule, TAKE 1 CAPSULE BY MOUTH EVERY DAY, Disp: 90 capsule, Rfl: 2   traZODone (DESYREL) 50 MG tablet, Take 0.5-1 tablets (25-50 mg total) by mouth at bedtime as needed. for sleep, Disp: 90 tablet, Rfl: 0   triamcinolone (KENALOG) 0.1 %, Apply 1 application  topically 3 (three) times daily as needed (psoriasis)., Disp: , Rfl:    venlafaxine XR (EFFEXOR-XR) 75 MG 24 hr capsule, TAKE 1 CAPSULE BY MOUTH DAILY WITH BREAKFAST., Disp: 90 capsule, Rfl: 1   Psyllium (METAMUCIL PO), Take 15 mLs by mouth daily. (Patient not taking: Reported on 11/13/2022), Disp: , Rfl:   PAST MEDICAL HISTORY: Past Medical History:  Diagnosis Date   Basal cell carcinoma    Bilateral Legs   CAD (coronary artery disease)    a.  stent to the RCA in 2001;  b.  Negative Myoview in January 2012;  c.  LHC 4/16:  dLM 20, small D1 50-70, D2 sub-total, oLCx 50-70, RCA stent ok, dPLB 90, EF 35-40% >> DES to D2   History of echocardiogram    a.  Echo 4/16: Mild LVH, EF 55-60%, grade 1 diastolic dysfunction, normal wall motion, MAC   HLD (hyperlipidemia)    HTN (hypertension)    MS (multiple sclerosis) (HCC)  OSA (obstructive sleep apnea)     no cpap    Pre-diabetes    Vertigo     PAST SURGICAL HISTORY: Past Surgical History:  Procedure Laterality Date   BIOPSY  07/26/2019   Procedure: BIOPSY;  Surgeon: Iva Boop, MD;  Location: [facility] ENDOSCOPY;  Service: Endoscopy;;   CATARACT EXTRACTION, BILATERAL     COLON RESECTION SIGMOID N/A 08/30/2019   Procedure: SIGMOID COLON RESECTION;  Surgeon: Berna Bue, MD;  Location: [facility] OR;  Service: General;  Laterality: N/A;   COLONOSCOPY N/A 07/26/2019   Procedure: COLONOSCOPY;  Surgeon: Iva Boop, MD;  Location: [facility] ENDOSCOPY;  Service: Endoscopy;  Laterality: N/A;   COLONOSCOPY     COLOSTOMY N/A 08/30/2019   Procedure: COLOSTOMY;  Surgeon: Berna Bue, MD;  Location: [facility] OR;  Service: General;  Laterality: N/A;   COLOSTOMY TAKEDOWN N/A 03/07/2020   Procedure: LAPAROSCOPIC COLOSTOMY REVERSAL, RIGID PROCTOSCOPY;  Surgeon: Berna Bue, MD;  Location: [facility] ORS;  Service: General;  Laterality: N/A;   CORONARY STENT PLACEMENT  2001    RCA   INCISIONAL HERNIA REPAIR N/A 09/24/2020   Procedure: LAPAROSCOPIC INCISIONAL  HERNIA REPAIR WITH MESH;  Surgeon: Berna Bue, MD;  Location: [facility] ORS;  Service: General;  Laterality: N/A;   LEFT HEART CATH AND CORONARY ANGIOGRAPHY N/A 10/28/2022   Procedure: LEFT HEART CATH AND CORONARY ANGIOGRAPHY;  Surgeon: [name], MD;  Location: [facility] INVASIVE CV LAB;  Service: Cardiovascular;  Laterality: N/A;   LEFT HEART CATHETERIZATION WITH CORONARY ANGIOGRAM N/A 02/13/2012   Procedure: LEFT HEART CATHETERIZATION WITH CORONARY ANGIOGRAM;  Surgeon: Peter M Swaziland, MD;  Location: [facility] CATH LAB;  Service: Cardiovascular;  Laterality: N/A;   LEFT HEART CATHETERIZATION WITH CORONARY ANGIOGRAM N/A 04/24/2014   Procedure: LEFT HEART CATHETERIZATION WITH CORONARY ANGIOGRAM;  Surgeon: Runell Gess, MD;  Location: [facility] CATH LAB;  Service: Cardiovascular;  Laterality: N/A;   LYSIS OF ADHESION N/A 03/07/2020   Procedure: LYSIS OF ADHESION;  Surgeon: Berna Bue, MD;  Location: [facility] ORS;  Service: General;  Laterality: N/A;   TONSILLECTOMY      FAMILY HISTORY: Family History  Problem Relation Age of Onset   Cirrhosis Mother    Alcohol abuse Mother    Pancreatic cancer Mother    Heart disease Father    Hypertension Father    Peripheral vascular disease Father    Coronary artery disease Father    Depression Sister    Heart attack Neg Hx    Stroke Neg Hx    Stomach cancer Neg Hx    Colon cancer Neg Hx    Esophageal cancer Neg Hx     SOCIAL HISTORY: Social History   Socioeconomic History   Marital status: Divorced    Spouse name: Not on file   Number of children: Not on file   Years of education: Not on file   Highest education level: Bachelor's degree (e.g., BA, AB, BS)  Occupational History   Not on file  Tobacco Use   Smoking status: Former    Current packs/day: 0.00    Average packs/day: 1 pack/day for 25.0 years (25.0 ttl pk-yrs)    Types: Cigarettes    Start date: 01/14/1975    Quit date: 01/14/2000    Years since quitting: 22.8   Smokeless tobacco: Never   Vaping Use   Vaping status: Never Used  Substance and Sexual Activity   Alcohol use: Yes    Alcohol/week: 3.0 standard drinks of alcohol  Types: 1 Cans of beer, 2 Shots of liquor per week    Comment: 4-5 drinks per week    Drug use: No   Sexual activity: Not Currently  Other Topics Concern   Not on file  Social History Narrative   2 kids, 4 grandkids, separated   Medical illustrator.   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   Right handed   Drinks caffeine   Single story home   Social Determinants of Health   Financial Resource Strain: Low Risk  (05/16/2022)   Overall Financial Resource Strain (CARDIA)    Difficulty of Paying Living Expenses: Not hard at all  Food Insecurity: No Food Insecurity (05/16/2022)   Hunger Vital Sign    Worried About Running Out of Food in the Last Year: Never true    Ran Out of Food in the Last Year: Never true  Transportation Needs: No Transportation Needs (05/16/2022)   PRAPARE - Administrator, Civil Service (Medical): No    Lack of Transportation (Non-Medical): No  Physical Activity: Insufficiently Active (05/16/2022)   Exercise Vital Sign    Days of Exercise per Week: 2 days    Minutes of Exercise per Session: 30 min  Stress: Stress Concern Present (05/16/2022)   [employer] of Occupational Health - Occupational Stress Questionnaire    Feeling of Stress : To some extent  Social Connections: Unknown (05/16/2022)   Social Connection and Isolation Panel [NHANES]    Frequency of Communication with Friends and Family: Three times a week    Frequency of Social Gatherings with Friends and Family: Three times a week    Attends Religious Services: Patient declined    Active Member of Clubs or Organizations: No    Attends Banker Meetings: Never    Marital Status: Divorced  Recent Concern: Social Connections - Socially Isolated (05/15/2022)   Social Connection and Isolation Panel [NHANES]    Frequency of Communication  with Friends and Family: More than three times a week    Frequency of Social Gatherings with Friends and Family: More than three times a week    Attends Religious Services: Never    Database administrator or Organizations: No    Attends Banker Meetings: Never    Marital Status: Divorced  Catering manager Violence: Not At Risk (05/15/2022)   Humiliation, Afraid, Rape, and Kick questionnaire    Fear of Current or Ex-Partner: No    Emotionally Abused: No    Physically Abused: No    Sexually Abused: No       PHYSICAL EXAM  Vitals:   11/13/22 1322  BP: 120/84  Pulse: (!) 111  Weight: 189 lb 8 oz (86 kg)  Height: 5\' 6"  (1.676 m)    Body mass index is 30.59 kg/m.   General: The patient is well-developed and well-nourished and in no acute distress  HEENT:  Head is [place]/AT.  Sclera are anicteric.  Funduscopic exam shows normal optic discs and retinal vessels.  Neck: No carotid bruits are noted.  The neck is nontender.  Cardiovascular: The heart has a regular rate and rhythm with a normal S1 and S2. There were no murmurs, gallops or rubs.    Skin: Extremities show mild edema and skin changes  Musculoskeletal:  Back is nontender  Neurologic Exam  Mental status: The patient is alert and oriented x 3 at the time of the examination. The patient has apparent normal recent and remote memory, with  an apparently normal attention span and concentration ability.   Speech is normal.  Cranial nerves: Extraocular movements are full.   There is good facial sensation to soft touch bilaterally.Facial strength is normal.  Trapezius and sternocleidomastoid strength is normal. No dysarthria is noted.  The tongue is midline, and the patient has symmetric elevation of the soft palate.  Reduced hearing on the left (he has hearing aids).  The Weber was midline..  Motor:  Muscle bulk is normal.   Tone is normal. Strength is  5 / 5 in all 4 extremities.   Sensory: Sensory testing is intact  to pinprick, soft touch and vibration sensation in arms but vibraiton is only 25% at ankles and 10% toes.  .  Coordination: Cerebellar testing reveals good finger-nose-finger and heel-to-shin bilaterally.  Gait and station: Station is normal.   Gait is mildly wide and turn is mildly off balanced. . Romberg is negative.   Reflexes: Deep tendon reflexes are symmetric and normal in arms but trace at the knees and absent at the ankles.   Plantar responses are flexor.    DIAGNOSTIC DATA (LABS, IMAGING, TESTING) - I reviewed patient records, labs, notes, testing and imaging myself where available.  Lab Results  Component Value Date   WBC 7.2 10/24/2022   HGB 15.7 10/24/2022   HCT 47.1 10/24/2022   MCV 94 10/24/2022   PLT 217 10/24/2022      Component Value Date/Time   NA 139 10/24/2022 0840   K 4.4 10/24/2022 0840   CL 102 10/24/2022 0840   CO2 22 10/24/2022 0840   GLUCOSE 216 (H) 10/24/2022 0840   GLUCOSE 159 (H) 04/28/2022 1349   GLUCOSE 118 (H) 12/08/2005 0741   BUN 14 10/24/2022 0840   CREATININE 1.29 (H) 10/24/2022 0840   CREATININE 1.55 (H) 06/12/2021 1445   CREATININE 1.11 05/10/2021 1534   CALCIUM 9.7 10/24/2022 0840   PROT 7.2 04/28/2022 1349   PROT 6.6 03/26/2016 0736   ALBUMIN 4.4 04/28/2022 1349   ALBUMIN 4.0 03/26/2016 0736   AST 23 04/28/2022 1349   AST 22 06/12/2021 1445   ALT 28 04/28/2022 1349   ALT 22 06/12/2021 1445   ALKPHOS 37 (L) 04/28/2022 1349   BILITOT 0.5 04/28/2022 1349   BILITOT 0.7 06/12/2021 1445   GFRNONAA 46 (L) 06/12/2021 1445   GFRAA >60 09/06/2019 0556   Lab Results  Component Value Date   CHOL 191 04/28/2022   HDL 64.10 04/28/2022   LDLCALC  05/10/2021     Comment:     . LDL cholesterol not calculated. Triglyceride levels greater than 400 mg/dL invalidate calculated LDL results. . Reference range: <100 . Desirable range <100 mg/dL for primary prevention;   <70 mg/dL for patients with CHD or diabetic patients  with > or = 2  CHD risk factors. Marland Kitchen LDL-C is now calculated using the Martin-Hopkins  calculation, which is a validated novel method providing  better accuracy than the Friedewald equation in the  estimation of LDL-C.  Horald Pollen et al. Lenox Ahr. 4696;295(28): 2061-2068  (http://education.[facility].com/faq/FAQ164)    LDLDIRECT 92.0 04/28/2022   TRIG (H) 04/28/2022    467.0 Triglyceride is over 400; calculations on Lipids are invalid.   CHOLHDL 3 04/28/2022   Lab Results  Component Value Date   HGBA1C 6.7 (A) 10/22/2022   Lab Results  Component Value Date   VITAMINB12 327 02/09/2020   Lab Results  Component Value Date   TSH 2.69 12/02/2018  ASSESSMENT AND PLAN  Polyneuropathy - Plan: Vitamin B12, Copper, serum, Multiple Myeloma Panel (SPEP&IFE w/QIG)  Gait disturbance - Plan: MR CERVICAL SPINE WO CONTRAST  Numbness - Plan: MR CERVICAL SPINE WO CONTRAST   In summary, Mr. Papenfuss is a 77 year old man who was diagnosed with MS at age 59 but had a very benign course with just 1 exacerbation a couple years later.  Since a prolonged hospitalization in 2022, he has had more difficulties with gait.  On his exam, he does have reduced vibration sensation that is moderate at the ankles and severe at the toes.  The pattern it would be most consistent with polyneuropathy but EMG in 2022 did not show this.  We will check some blood work for B12, copper (has had colon resection), SPEP/IFE.  An exacerbation of MS at age 72, would have been very unlikely, especially in a patient who has had such a benign course.  Possibly, however, he could have had spinal cord lesions in the past that became more symptomatic due to his medical problems in 2022.  We will check an MRI of the cervical spine to assess for MS changes as well as to determine if there is any myelopathy that could also explain symptoms.  He will return to see [place] after the studies based on the results or sooner if there are new or worsening  neurologic symptoms.    Shelia Kingsberry A. Epimenio Foot, MD, [facility] 11/13/2022, 5:35 PM Certified in Neurology, Clinical Neurophysiology, Sleep Medicine and Neuroimaging  [facility] [address], [address] [place], [place] [address] [phone]

## 2022-11-13 ENCOUNTER — Ambulatory Visit: Payer: Medicare HMO | Admitting: Neurology

## 2022-11-13 ENCOUNTER — Encounter: Payer: Self-pay | Admitting: Neurology

## 2022-11-13 ENCOUNTER — Telehealth: Payer: Self-pay | Admitting: Neurology

## 2022-11-13 VITALS — BP 120/84 | HR 111 | Ht 66.0 in | Wt 189.5 lb

## 2022-11-13 DIAGNOSIS — R269 Unspecified abnormalities of gait and mobility: Secondary | ICD-10-CM | POA: Diagnosis not present

## 2022-11-13 DIAGNOSIS — R200 Anesthesia of skin: Secondary | ICD-10-CM

## 2022-11-13 DIAGNOSIS — G629 Polyneuropathy, unspecified: Secondary | ICD-10-CM

## 2022-11-13 NOTE — Telephone Encounter (Signed)
sent to GI they obtain [employer] medicare auth [phone] 

## 2022-11-18 ENCOUNTER — Encounter: Payer: Self-pay | Admitting: Neurology

## 2022-11-19 LAB — MULTIPLE MYELOMA PANEL, SERUM
Albumin SerPl Elph-Mcnc: 3.7 g/dL (ref 2.9–4.4)
Albumin/Glob SerPl: 1.3 (ref 0.7–1.7)
Alpha 1: 0.3 g/dL (ref 0.0–0.4)
Alpha2 Glob SerPl Elph-Mcnc: 0.9 g/dL (ref 0.4–1.0)
B-Globulin SerPl Elph-Mcnc: 1.2 g/dL (ref 0.7–1.3)
Gamma Glob SerPl Elph-Mcnc: 0.6 g/dL (ref 0.4–1.8)
Globulin, Total: 2.9 g/dL (ref 2.2–3.9)
IgA/Immunoglobulin A, Serum: 200 mg/dL (ref 61–437)
IgG (Immunoglobin G), Serum: 652 mg/dL (ref 603–1613)
IgM (Immunoglobulin M), Srm: 46 mg/dL (ref 15–143)
Total Protein: 6.6 g/dL (ref 6.0–8.5)

## 2022-11-19 LAB — COPPER, SERUM: Copper: 81 ug/dL (ref 69–132)

## 2022-11-19 LAB — VITAMIN B12: Vitamin B-12: 515 pg/mL (ref 232–1245)

## 2022-11-20 ENCOUNTER — Ambulatory Visit: Payer: Medicare HMO | Admitting: Family Medicine

## 2022-11-20 ENCOUNTER — Encounter: Payer: Self-pay | Admitting: Family Medicine

## 2022-11-20 VITALS — BP 92/68 | HR 115 | Temp 97.7°F | Ht 66.0 in | Wt 193.4 lb

## 2022-11-20 DIAGNOSIS — I200 Unstable angina: Secondary | ICD-10-CM

## 2022-11-20 DIAGNOSIS — M79605 Pain in left leg: Secondary | ICD-10-CM

## 2022-11-20 DIAGNOSIS — E785 Hyperlipidemia, unspecified: Secondary | ICD-10-CM | POA: Diagnosis not present

## 2022-11-20 DIAGNOSIS — R2689 Other abnormalities of gait and mobility: Secondary | ICD-10-CM

## 2022-11-20 DIAGNOSIS — M545 Low back pain, unspecified: Secondary | ICD-10-CM

## 2022-11-20 DIAGNOSIS — E1169 Type 2 diabetes mellitus with other specified complication: Secondary | ICD-10-CM | POA: Diagnosis not present

## 2022-11-20 NOTE — Progress Notes (Signed)
Patient ID: Roy [name], male    DOB: 1945/12/31, 77 y.o.   MRN: [id number]  This visit was conducted in person.  BP 92/68 (BP Location: Right Arm, Patient Position: Sitting, Cuff Size: Large)   Pulse (!) 115   Temp 97.7 F (36.5 C) (Temporal)   Ht 5\' 6"  (1.676 m)   Wt 193 lb 6 oz (87.7 kg)   SpO2 95%   BMI 31.21 kg/m    CC:  Chief Complaint  Patient presents with   Medical Management of Chronic Issues    Follow up Multiple Issues    Subjective:   HPI: Roy [name] is a 77 y.o. male presenting on 11/20/2022 for Medical Management of Chronic Issues (Follow up Multiple Issues)  At last office visit October 22, 2022:  Referred to new neurologist for consideration of recurrence of MS given worsening balance  Continued issue with poor balance for years. Poor equilibriuum. No dizziness, is is somewhat dizzy when turning head.  Chronic low back pain, bilateral foot drop: Treated with prednisone taper but discussed restarting physical therapy at home.  Recommended consideration if not improving with referral back to back specialist for steroid injections  He had also noted unstable angina and increased use of nitroglycerin.  He was referred back to cardiology, Dr. Eden Emms, encouraged earlier office visit. Fortunately patient was able to be seen October 11 Recommended cardiac catheterization which was performed on October 14 This revealed patent stents and stenosis of ostium of the first diagonal proximal to stent (no intervention given location) Added isosorbide 30 mg daily as a long-acting nitrate. Recommended continued aspirin, metoprolol. Start  He also was able to see Dr. Epimenio Foot neurology for new patient office visit Felt most likely polyneuropathy as opposed to MS causing symptoms given his past benign course.    Sent patient for labs including B12, copper, multiple myeloma panel ( all normal), MRI cervical spine to evaluate for spinal cord lesions from MS..  scheduled 11/10.  Today he reports he is having some improvement in angina.  No SE to new zetia.  Some slight improvement temporarily with back pain on  prednisone  Still having low back pain after activity.  BP Readings from Last 3 Encounters:  11/26/22 120/80  11/20/22 92/68  11/13/22 120/84     Relevant past medical, surgical, family and social history reviewed and updated as indicated. Interim medical history since our last visit reviewed. Allergies and medications reviewed and updated. Outpatient Medications Prior to Visit  Medication Sig Dispense Refill   aspirin EC 81 MG tablet Take 81 mg by mouth daily. Swallow whole.     atorvastatin (LIPITOR) 80 MG tablet TAKE 1 TABLET BY MOUTH EVERY DAY 90 tablet 3   ezetimibe (ZETIA) 10 MG tablet Take 1 tablet (10 mg total) by mouth daily. 90 tablet 3   feeding supplement (ENSURE IMMUNE HEALTH) LIQD Take 237 mLs by mouth in the morning.     fenofibrate (TRICOR) 145 MG tablet TAKE 1 TABLET BY MOUTH EVERY DAY 90 tablet 3   fluocinonide (LIDEX) 0.05 % external solution Apply 1 application topically at bedtime as needed (psoriasis).     hydrocortisone cream 1 % Apply 1 application topically 2 (two) times daily as needed for itching.     Magnesium 250 MG TABS Take 250 mg by mouth daily.     metoprolol succinate (TOPROL-XL) 50 MG 24 hr tablet Take 1 tablet (50 mg total) by mouth daily. Take with or immediately following a  meal. 90 tablet 3   nitroGLYCERIN (NITROSTAT) 0.4 MG SL tablet Place 1 tablet (0.4 mg total) under the tongue every 5 (five) minutes x 3 doses as needed for chest pain. 25 tablet 5   omeprazole (PRILOSEC) 20 MG capsule TAKE 1 CAPSULE BY MOUTH EVERY DAY 90 capsule 2   traZODone (DESYREL) 50 MG tablet Take 0.5-1 tablets (25-50 mg total) by mouth at bedtime as needed. for sleep 90 tablet 0   triamcinolone (KENALOG) 0.1 % Apply 1 application topically 3 (three) times daily as needed (psoriasis).     venlafaxine XR (EFFEXOR-XR) 75 MG  24 hr capsule TAKE 1 CAPSULE BY MOUTH DAILY WITH BREAKFAST. 90 capsule 1   isosorbide mononitrate (IMDUR) 30 MG 24 hr tablet Take 1 tablet (30 mg total) by mouth daily. 90 tablet 3   Psyllium (METAMUCIL PO) Take 15 mLs by mouth daily. (Patient not taking: Reported on 11/13/2022)     No facility-administered medications prior to visit.     Per HPI unless specifically indicated in ROS section below Review of Systems  Constitutional:  Negative for fatigue and fever.  HENT:  Negative for ear pain.   Eyes:  Negative for pain.  Respiratory:  Negative for cough and shortness of breath.   Cardiovascular:  Negative for chest pain, palpitations and leg swelling.  Gastrointestinal:  Negative for abdominal pain.  Genitourinary:  Negative for dysuria.  Musculoskeletal:  Positive for back pain. Negative for arthralgias.  Neurological:  Positive for dizziness, weakness and light-headedness. Negative for syncope and headaches.  Psychiatric/Behavioral:  Negative for dysphoric mood.    Objective:  BP 92/68 (BP Location: Right Arm, Patient Position: Sitting, Cuff Size: Large)   Pulse (!) 115   Temp 97.7 F (36.5 C) (Temporal)   Ht 5\' 6"  (1.676 m)   Wt 193 lb 6 oz (87.7 kg)   SpO2 95%   BMI 31.21 kg/m   Wt Readings from Last 3 Encounters:  11/26/22 191 lb (86.6 kg)  11/20/22 193 lb 6 oz (87.7 kg)  11/13/22 189 lb 8 oz (86 kg)      Physical Exam Constitutional:      Appearance: He is well-developed.  HENT:     Head: Normocephalic.     Right Ear: Hearing normal.     Left Ear: Hearing normal.     Nose: Nose normal.  Neck:     Thyroid: No thyroid mass or thyromegaly.     Vascular: No carotid bruit.     Trachea: Trachea normal.  Cardiovascular:     Rate and Rhythm: Normal rate and regular rhythm.     Pulses: Normal pulses.     Heart sounds: Heart sounds not distant. No murmur heard.    No friction rub. No gallop.     Comments: No peripheral edema Pulmonary:     Effort: Pulmonary effort  is normal. No respiratory distress.     Breath sounds: Normal breath sounds.  Musculoskeletal:     Lumbar back: Tenderness present. Decreased range of motion. Negative right straight leg raise test and negative left straight leg raise test.     Comments: TTP in left lower back and sciatic notch  Skin:    General: Skin is warm and dry.     Findings: No rash.  Neurological:     Mental Status: He is oriented to person, place, and time.     Cranial Nerves: Cranial nerves 2-12 are intact.     Sensory: Sensation is intact.  Motor: Tremor present. No weakness or atrophy.  Psychiatric:        Attention and Perception: Attention normal.        Mood and Affect: Mood is anxious.        Speech: Speech normal.        Behavior: Behavior normal.        Thought Content: Thought content normal.        Cognition and Memory: Cognition normal.       Results for orders placed or performed in visit on 11/13/22  Vitamin B12  Result Value Ref Range   Vitamin B-12 515 232 - 1,245 pg/mL  Copper, serum  Result Value Ref Range   Copper 81 69 - 132 ug/dL  Multiple Myeloma Panel (SPEP&IFE w/QIG)  Result Value Ref Range   IgG (Immunoglobin G), Serum 652 603 - 1,613 mg/dL   IgA/Immunoglobulin A, Serum 200 61 - 437 mg/dL   IgM (Immunoglobulin M), Srm 46 15 - 143 mg/dL   Total Protein 6.6 6.0 - 8.5 g/dL   Albumin SerPl Elph-Mcnc 3.7 2.9 - 4.4 g/dL   Alpha 1 0.3 0.0 - 0.4 g/dL   Alpha2 Glob SerPl Elph-Mcnc 0.9 0.4 - 1.0 g/dL   B-Globulin SerPl Elph-Mcnc 1.2 0.7 - 1.3 g/dL   Gamma Glob SerPl Elph-Mcnc 0.6 0.4 - 1.8 g/dL   M Protein SerPl Elph-Mcnc Not Observed Not Observed g/dL   Globulin, Total 2.9 2.2 - 3.9 g/dL   Albumin/Glob SerPl 1.3 0.7 - 1.7   IFE 1 Comment    Please Note Comment     Assessment and Plan  Poor balance Assessment & Plan:  Reviewed last OV note: Dr. Epimenio Foot neurology for new patient office visit Felt most likely polyneuropathy as opposed to MS causing symptoms given his past  benign course.    Sent patient for labs including B12, copper, multiple myeloma panel ( all normal), MRI cervical spine to evaluate for spinal cord lesions from MS.. scheduled 11/10.   Low back pain radiating to left lower extremity Assessment & Plan: Treated with prednisone taper but discussed restarting physical therapy at home.  Recommended consideration if not improving with referral back to back specialist for steroid injections   Hyperlipidemia associated with type 2 diabetes mellitus (HCC) Assessment & Plan: Chronic.. now on zetia.Marland Kitchen re-eval cholesterol in 3 months.   Unstable angina [facility]) Assessment & Plan:  He was referred back to cardiology, Dr. Eden Emms, encouraged earlier office visit. Fortunately patient was able to be seen October 11 Recommended cardiac catheterization which was performed on October 14 This revealed patent stents and stenosis of ostium of the first diagonal proximal to stent (no intervention given location) Added isosorbide 30 mg daily as a long-acting nitrate. Recommended continued aspirin, metoprolol. Start      No follow-ups on file.   Kerby Nora, MD

## 2022-11-23 ENCOUNTER — Ambulatory Visit
Admission: RE | Admit: 2022-11-23 | Discharge: 2022-11-23 | Disposition: A | Discharge status: Home / Self Care | Payer: Medicare HMO | Source: Ambulatory Visit | Attending: Neurology | Admitting: Neurology

## 2022-11-23 DIAGNOSIS — R269 Unspecified abnormalities of gait and mobility: Secondary | ICD-10-CM

## 2022-11-23 DIAGNOSIS — R200 Anesthesia of skin: Secondary | ICD-10-CM

## 2022-11-26 ENCOUNTER — Encounter: Payer: Self-pay | Admitting: Cardiology

## 2022-11-26 ENCOUNTER — Ambulatory Visit: Payer: Medicare HMO | Attending: Cardiology | Admitting: Cardiology

## 2022-11-26 VITALS — BP 120/80 | HR 80 | Ht 66.0 in | Wt 191.0 lb

## 2022-11-26 DIAGNOSIS — R2689 Other abnormalities of gait and mobility: Secondary | ICD-10-CM

## 2022-11-26 DIAGNOSIS — I2510 Atherosclerotic heart disease of native coronary artery without angina pectoris: Secondary | ICD-10-CM

## 2022-11-26 DIAGNOSIS — E1169 Type 2 diabetes mellitus with other specified complication: Secondary | ICD-10-CM

## 2022-11-26 DIAGNOSIS — E785 Hyperlipidemia, unspecified: Secondary | ICD-10-CM

## 2022-11-26 MED ORDER — ISOSORBIDE MONONITRATE ER 30 MG PO TB24: 60.0000 mg | ORAL_TABLET | Freq: Every day | ORAL | Status: DC

## 2022-11-26 NOTE — Progress Notes (Signed)
Cardiology Office Note:   Date:  11/26/2022  ID:  Roy [name], DOB 1945/08/26, MRN [id number] PCP: Excell Seltzer, MD  [facility] Providers Cardiologist:  Charlton Haws, MD    History of Present Illness:   Discussed the use of AI scribe software for clinical note transcription with the patient, who gave verbal consent to proceed.    Roy [name] has a history of coronary artery disease and multiple stents.  He presented to visit with Dr. Eden Emms in October with chest discomfort. Given hx, LHC was arranged. This found patent stents (widely patent first diag stent, distal RCA stent patient with moderate in-stent restenosis) and only a small vessel 90% stenosis of ostium of diagonal (felt that PCI would jeopardize LAD).    Following this LHC, patient reported largely unchanged chest pain. The pain was described as starting in central chest, radiating halfway down both arms, typically occurring at rest, particularly after getting up at night. The discomfort was noted to be less severe than prior to stent placement, but similar in nature. Nitroglycerin had been used twice in the prior month for relief, with each episode lasting about 15 minutes before resolution. He denied any occurrence with exertion, has been able to walk without angina or dyspnea.   Today he presents for reassessment after starting Imdur 30mg . Patient reports a significant improvement in chest pain since the last consultation. He reported only one episode of chest pain in the past month, which was similar in nature to his previous episodes, characterized by pain starting in the chest and radiating halfway down the arms. This episode occurred at rest, specifically after waking up in the middle of the night to urinate. The pain was relieved after a few minutes with nitroglycerin, although the patient noted that it took slightly longer for the nitroglycerin to act compared to previous instances ( instead of ).  He  continues to deny chest pain or dyspnea with exertion. He also reported occasional leg cramps, primarily occurring at night in bed, which he attributed to inactivity.   The patient also reported ongoing occasional lightheadedness and dizziness, for which he recently underwent an MRI. The results of the MRI were reportedly normal. Additionally, the patient mentioned an increase in appetite, but no other new symptoms. He denied experiencing shortness of breath, palpitations, or swelling in the legs.    Studies Reviewed:    10/28/22 LHC     1st Diag-1 lesion is 90% stenosed.   Dist RCA lesion is 20% stenosed.   Previously placed 1st Diag-2 stent of unknown type is  widely patent.   1.  Mild LAD disease. 2.  Widely patent first diagonal stent; the ostium of the diagonal has a 90% stenosis.  PCI of this area would jeopardize the LAD.  Medical therapy should be pursued. 3.  Right coronary artery disease with mild diffuse disease; the distal right coronary artery stent is patent with moderate in-stent restenosis. 4.  LVEDP of 17 mmHg.   Recommendation: Medical therapy.  Risk Assessment/Calculations:              Physical Exam:   VS:  BP 120/80 (BP Location: Left Arm, Patient Position: Sitting, Cuff Size: Normal)   Pulse 80   Ht 5\' 6"  (1.676 m)   Wt 191 lb (86.6 kg)   BMI 30.83 kg/m    Wt Readings from Last 3 Encounters:  11/26/22 191 lb (86.6 kg)  11/20/22 193 lb 6 oz (87.7 kg)  11/13/22 189 lb  8 oz (86 kg)     Physical Exam Vitals reviewed.  Constitutional:      Appearance: Normal appearance.  HENT:     Head: Normocephalic.  Eyes:     Pupils: Pupils are equal, round, and reactive to light.  Cardiovascular:     Rate and Rhythm: Normal rate and regular rhythm.     Pulses: Normal pulses.     Heart sounds: Normal heart sounds.  Pulmonary:     Effort: Pulmonary effort is normal.     Breath sounds: Normal breath sounds.  Musculoskeletal:     Right lower leg: No edema.      Left lower leg: No edema.  Skin:    General: Skin is warm and dry.     Capillary Refill: Capillary refill takes less than 2 seconds.  Neurological:     General: No focal deficit present.     Mental Status: He is alert and oriented to person, place, and time.  Psychiatric:        Mood and Affect: Mood normal.        Behavior: Behavior normal.        Thought Content: Thought content normal.        Judgment: Judgment normal.     Physical Exam   CHEST: Lung sounds clear on auscultation. CARDIOVASCULAR: Heart sounds normal on auscultation. EXTREMITIES: No edema.       ASSESSMENT AND PLAN:     Assessment and Plan    Angina Pectoris   Coronary Artery Disease  Recent LHC arranged due to chest pain found widely patent first diag stent, distal RCA stent patient with moderate in-stent restenosis and only a small vessel 90% stenosis of ostium of diagonal (felt that PCI would jeopardize LAD). Continues with intermittent chest pain at rest, improved since starting Imdur. One episode in the past month, relieved by nitroglycerin. No associated dyspnea, palpitations, or edema. Discussed increasing Imdur to 60 mg daily to reduce episodes. Potential side effects include headaches and dizziness. Patient agreeable.   - Increase Imdur to 60 mg daily   - Continue aspirin   - Continue metoprolol succinate 50 mg daily   - Monitor for dizziness, headaches\ - Reassess in three months    Hyperlipidemia   LDL cholesterol at 92 mg/dL in April. Currently on Lipitor 80 mg, Zetia 10 mg, and Tricor 145 mg. Goal LDL <70 mg/dL. Discussed rechecking LDL to assess therapy effectiveness.   - Recheck LDL and liver function tests today   - Continue current lipid-lowering therapy    Blood Pressure Management Current BP is stable (120/80). -Patient to monitor BP at home, aiming for systolic between 528-413. -Notify provider if BP consistently less than 100 or symptoms of dizziness or worsened balance  occur.  Dizziness   Intermittent dizziness, recent MRI with no significant findings. No associated headaches. Discussed monitoring symptoms and reporting changes.   - Monitor symptoms and report changes to neurology.   Leg Cramps   Nighttime cramping. No pain while walking. Low concern for PAD etiology. Discussed increasing physical activity as tolerated.   - Increase physical activity as tolerated    General Health Maintenance   Blood pressure well-controlled. No recent lab work other than for catheterization and multiple myeloma monitoring.   - Monitor blood pressure regularly   - Follow up with primary care physician as needed    Follow-up   - Schedule follow-up appointment in three months   - Perform lab work for cholesterol and liver function  today.            Signed, Perlie Gold, PA-C

## 2022-11-26 NOTE — Patient Instructions (Addendum)
Medication Instructions:  Your physician has recommended you make the following change in your medication:   1) INCREASE isosorbide mononitrate (Imdur) to 60 mg daily      You may take 2 of the 30 mg tablets you already have, when you have about a      week left of medication please call our office to send in a new prescription for a      60 mg tablet for you.  *If you need a refill on your cardiac medications before your next appointment, please call your pharmacy*  Lab Work: TODAY: LDL direct, LFTs If you have labs (blood work) drawn today and your tests are completely normal, you will receive your results only by: MyChart Message (if you have MyChart) OR A paper copy in the mail If you have any lab test that is abnormal or we need to change your treatment, we will call you to review the results.  Testing/Procedures: None ordered today.  Follow-Up: At [facility], you and your health needs are our priority.  As part of our continuing mission to provide you with exceptional heart care, we have created designated Provider Care Teams.  These Care Teams include your primary Cardiologist (physician) and Advanced Practice Providers (APPs -  Physician Assistants and Nurse Practitioners) who all work together to provide you with the care you need, when you need it.  Your next appointment:   3 month(s)  The format for your next appointment:   In Person  Provider:   Perlie Gold, PA-C

## 2022-11-27 LAB — HEPATIC FUNCTION PANEL
ALT: 26 [IU]/L (ref 0–44)
AST: 24 [IU]/L (ref 0–40)
Albumin: 4 g/dL (ref 3.8–4.8)
Alkaline Phosphatase: 39 [IU]/L — ABNORMAL LOW (ref 44–121)
Bilirubin Total: 0.4 mg/dL (ref 0.0–1.2)
Bilirubin, Direct: 0.15 mg/dL (ref 0.00–0.40)
Total Protein: 6.3 g/dL (ref 6.0–8.5)

## 2022-11-27 LAB — LDL CHOLESTEROL, DIRECT: LDL Direct: 58 mg/dL (ref 0–99)

## 2022-12-03 NOTE — Assessment & Plan Note (Signed)
He was referred back to cardiology, Dr. Eden Emms, encouraged earlier office visit. Fortunately patient was able to be seen October 11 Recommended cardiac catheterization which was performed on October 14 This revealed patent stents and stenosis of ostium of the first diagonal proximal to stent (no intervention given location) Added isosorbide 30 mg daily as a long-acting nitrate. Recommended continued aspirin, metoprolol. Start

## 2022-12-03 NOTE — Assessment & Plan Note (Signed)
Reviewed last OV note: Dr. Epimenio Foot neurology for new patient office visit Felt most likely polyneuropathy as opposed to MS causing symptoms given his past benign course.    Sent patient for labs including B12, copper, multiple myeloma panel ( all normal), MRI cervical spine to evaluate for spinal cord lesions from MS.. scheduled 11/10.

## 2022-12-03 NOTE — Assessment & Plan Note (Signed)
Chronic.. now on zetia.Marland Kitchen re-eval cholesterol in 3 months.

## 2022-12-03 NOTE — Assessment & Plan Note (Signed)
Treated with prednisone taper but discussed restarting physical therapy at home.  Recommended consideration if not improving with referral back to back specialist for steroid injections

## 2022-12-25 ENCOUNTER — Ambulatory Visit: Payer: Medicare HMO | Admitting: Cardiovascular Disease

## 2022-12-28 DIAGNOSIS — Z008 Encounter for other general examination: Secondary | ICD-10-CM | POA: Diagnosis not present

## 2023-01-12 ENCOUNTER — Other Ambulatory Visit: Payer: Self-pay | Admitting: Cardiovascular Disease

## 2023-02-08 ENCOUNTER — Encounter: Payer: Self-pay | Admitting: Family Medicine

## 2023-02-12 ENCOUNTER — Encounter: Payer: Self-pay | Admitting: Family Medicine

## 2023-02-12 ENCOUNTER — Ambulatory Visit: Payer: Medicare HMO | Admitting: Family Medicine

## 2023-02-12 VITALS — BP 100/70 | HR 80 | Temp 98.2°F | Ht 66.0 in | Wt 194.2 lb

## 2023-02-12 DIAGNOSIS — J101 Influenza due to other identified influenza virus with other respiratory manifestations: Secondary | ICD-10-CM | POA: Insufficient documentation

## 2023-02-12 DIAGNOSIS — R051 Acute cough: Secondary | ICD-10-CM | POA: Diagnosis not present

## 2023-02-12 LAB — POC INFLUENZA A&B (BINAX/QUICKVUE)
Influenza A, POC: POSITIVE — AB
Influenza B, POC: NEGATIVE

## 2023-02-12 LAB — POC COVID19 BINAXNOW: SARS Coronavirus 2 Ag: NEGATIVE

## 2023-02-12 MED ORDER — PREDNISONE 20 MG PO TABS: ORAL_TABLET | ORAL | 0 refills | Status: DC

## 2023-02-12 MED ORDER — OSELTAMIVIR PHOSPHATE 75 MG PO CAPS: 75.0000 mg | ORAL_CAPSULE | Freq: Two times a day (BID) | ORAL | 0 refills | Status: DC

## 2023-02-12 MED ORDER — TAMSULOSIN HCL 0.4 MG PO CAPS: 0.4000 mg | ORAL_CAPSULE | Freq: Every day | ORAL | 3 refills | Status: DC

## 2023-02-12 MED ORDER — BENZONATATE 200 MG PO CAPS: 200.0000 mg | ORAL_CAPSULE | Freq: Two times a day (BID) | ORAL | 0 refills | Status: DC | PRN

## 2023-02-12 NOTE — Telephone Encounter (Signed)
Sent in prescription for Flomax when patient was in the office today.

## 2023-02-12 NOTE — Progress Notes (Signed)
Patient ID: Roy [name], male    DOB: September 19, 1945, 78 y.o.   MRN: [id number]  This visit was conducted in person.  BP 100/70 (BP Location: Right Arm, Patient Position: Sitting, Cuff Size: Large)   Pulse 80   Temp 98.2 F (36.8 C) (Temporal)   Ht 5\' 6"  (1.676 m)   Wt 194 lb 4 oz (88.1 kg)   SpO2 95%   BMI 31.35 kg/m    CC:  Chief Complaint  Patient presents with   Cough    Dry-Started yesterday    Subjective:   HPI: Roy [name] is a 78 y.o. male presenting on 02/12/2023 for Cough (Dry-Started yesterday)   Date of onset:  24 hours Initial symptoms included  sneeze and hoarseness. Symptoms progressed to cough, dry  Crackling when breathing   He  is SOB with rest and exertion.  Wheeze. No fever  No body aches... achy in abd with coughing  No ear pain, no face pain, no ST.   Sick contacts:  none COVID testing:   none     He has tried to treat with   tylenol, cough medication.. cough keeping him up at night.     No history of chronic lung disease such as asthma or COPD.  Has DM, CAD, PSA Formers moker.       Relevant past medical, surgical, family and social history reviewed and updated as indicated. Interim medical history since our last visit reviewed. Allergies and medications reviewed and updated. Outpatient Medications Prior to Visit  Medication Sig Dispense Refill   aspirin EC 81 MG tablet Take 81 mg by mouth daily. Swallow whole.     atorvastatin (LIPITOR) 80 MG tablet TAKE 1 TABLET BY MOUTH EVERY DAY 90 tablet 3   ezetimibe (ZETIA) 10 MG tablet Take 1 tablet (10 mg total) by mouth daily. 90 tablet 3   feeding supplement (ENSURE IMMUNE HEALTH) LIQD Take 237 mLs by mouth in the morning.     fenofibrate (TRICOR) 145 MG tablet TAKE 1 TABLET BY MOUTH EVERY DAY 90 tablet 3   fluocinonide (LIDEX) 0.05 % external solution Apply 1 application topically at bedtime as needed (psoriasis).     hydrocortisone cream 1 % Apply 1 application topically 2  (two) times daily as needed for itching.     isosorbide mononitrate (IMDUR) 30 MG 24 hr tablet Take 2 tablets (60 mg total) by mouth daily.     Magnesium 250 MG TABS Take 250 mg by mouth daily.     metoprolol succinate (TOPROL-XL) 50 MG 24 hr tablet Take 1 tablet (50 mg total) by mouth daily. Take with or immediately following a meal. 90 tablet 3   nitroGLYCERIN (NITROSTAT) 0.4 MG SL tablet Place 1 tablet (0.4 mg total) under the tongue every 5 (five) minutes x 3 doses as needed for chest pain. 25 tablet 5   omeprazole (PRILOSEC) 20 MG capsule TAKE 1 CAPSULE BY MOUTH EVERY DAY 90 capsule 3   traZODone (DESYREL) 50 MG tablet Take 0.5-1 tablets (25-50 mg total) by mouth at bedtime as needed. for sleep 90 tablet 0   triamcinolone (KENALOG) 0.1 % Apply 1 application topically 3 (three) times daily as needed (psoriasis).     venlafaxine XR (EFFEXOR-XR) 75 MG 24 hr capsule TAKE 1 CAPSULE BY MOUTH DAILY WITH BREAKFAST. 90 capsule 1   No facility-administered medications prior to visit.     Per HPI unless specifically indicated in ROS section below Review of Systems  Constitutional:  Negative for fatigue and fever.  HENT:  Negative for ear pain.   Eyes:  Negative for pain.  Respiratory:  Positive for cough, shortness of breath and wheezing.   Cardiovascular:  Negative for chest pain, palpitations and leg swelling.  Gastrointestinal:  Negative for abdominal pain.  Genitourinary:  Negative for dysuria.  Musculoskeletal:  Negative for arthralgias.  Neurological:  Negative for syncope, light-headedness and headaches.  Psychiatric/Behavioral:  Negative for dysphoric mood.    Objective:  BP 100/70 (BP Location: Right Arm, Patient Position: Sitting, Cuff Size: Large)   Pulse 80   Temp 98.2 F (36.8 C) (Temporal)   Ht 5\' 6"  (1.676 m)   Wt 194 lb 4 oz (88.1 kg)   SpO2 95%   BMI 31.35 kg/m   Wt Readings from Last 3 Encounters:  02/12/23 194 lb 4 oz (88.1 kg)  11/26/22 191 lb (86.6 kg)  11/20/22  193 lb 6 oz (87.7 kg)      Physical Exam Constitutional:      General: He is not in acute distress.    Appearance: Normal appearance. He is well-developed. He is not ill-appearing or toxic-appearing.  HENT:     Head: Normocephalic and atraumatic.     Right Ear: Hearing, tympanic membrane, ear canal and external ear normal. No tenderness. No foreign body. Tympanic membrane is not retracted or bulging.     Left Ear: Hearing, tympanic membrane, ear canal and external ear normal. No tenderness. No foreign body. Tympanic membrane is not retracted or bulging.     Nose: Nose normal. No mucosal edema or rhinorrhea.     Right Sinus: No maxillary sinus tenderness or frontal sinus tenderness.     Left Sinus: No maxillary sinus tenderness or frontal sinus tenderness.     Mouth/Throat:     Dentition: Normal dentition. No dental caries.     Pharynx: Uvula midline. No oropharyngeal exudate.     Tonsils: No tonsillar abscesses.  Eyes:     General: Lids are normal. Lids are everted, no foreign bodies appreciated.     Conjunctiva/sclera: Conjunctivae normal.     Pupils: Pupils are equal, round, and reactive to light.  Neck:     Thyroid: No thyroid mass or thyromegaly.     Vascular: No carotid bruit.     Trachea: Trachea and phonation normal.  Cardiovascular:     Rate and Rhythm: Normal rate and regular rhythm.     Pulses: Normal pulses.     Heart sounds: Normal heart sounds, S1 normal and S2 normal. No murmur heard.    No gallop.  Pulmonary:     Effort: Pulmonary effort is normal. No respiratory distress.     Breath sounds: Examination of the right-lower field reveals rhonchi. Examination of the left-lower field reveals rhonchi. Rhonchi present. No wheezing or rales.  Abdominal:     General: Bowel sounds are normal.     Palpations: Abdomen is soft.     Tenderness: There is no abdominal tenderness. There is no guarding or rebound.     Hernia: No hernia is present.  Musculoskeletal:     Cervical  back: Normal range of motion and neck supple.  Skin:    General: Skin is warm and dry.     Findings: No rash.  Neurological:     Mental Status: He is alert.     Deep Tendon Reflexes: Reflexes are normal and symmetric.  Psychiatric:        Speech: Speech normal.  Behavior: Behavior normal.        Judgment: Judgment normal.       Results for orders placed or performed in visit on 02/12/23  POC COVID-19   Collection Time: 02/12/23 10:46 AM  Result Value Ref Range   SARS Coronavirus 2 Ag Negative Negative  POC Influenza A&B (Binax test)   Collection Time: 02/12/23 10:46 AM  Result Value Ref Range   Influenza A, POC Positive (A) Negative   Influenza B, POC Negative Negative    Assessment and Plan  Acute cough -     POC COVID-19 BinaxNow -     POC Influenza A&B(BINAX/QUICKVUE)  Influenza A Assessment & Plan: Discussed symptomatic care.  Hydration, rest. Call if SOB, cough worsening or prolongued fever.  Go to ED if severe SOB. Reviewed flu timeline.  No complications from influenza he is high risk p so we will treat with Tamiflu 75 mg p.o. twice daily x 5 days.  He has some symptoms of reactive airway disease possible COPD not yet diagnosed given tobacco history.  Will treat with prednisone taper.  t agreed. Discussed family prophylaxis,He was advised to not return to work until 24 hour after fever resolved on no antipyretics.     Other orders -     Oseltamivir Phosphate; Take 1 capsule (75 mg total) by mouth 2 (two) times daily.  Dispense: 10 capsule; Refill: 0 -     predniSONE; 3 tabs by mouth daily x 3 days, then 2 tabs by mouth daily x 2 days then 1 tab by mouth daily x 2 days  Dispense: 15 tablet; Refill: 0 -     Benzonatate; Take 1 capsule (200 mg total) by mouth 2 (two) times daily as needed for cough.  Dispense: 20 capsule; Refill: 0 -     Tamsulosin HCl; Take 1 capsule (0.4 mg total) by mouth daily.  Dispense: 30 capsule; Refill: 3    No follow-ups on file.    Kerby Nora, MD

## 2023-02-12 NOTE — Assessment & Plan Note (Signed)
Discussed symptomatic care.  Hydration, rest. Call if SOB, cough worsening or prolongued fever.  Go to ED if severe SOB. Reviewed flu timeline.  No complications from influenza he is high risk p so we will treat with Tamiflu 75 mg p.o. twice daily x 5 days.  He has some symptoms of reactive airway disease possible COPD not yet diagnosed given tobacco history.  Will treat with prednisone taper.  t agreed. Discussed family prophylaxis,He was advised to not return to work until 24 hour after fever resolved on no antipyretics.

## 2023-02-18 ENCOUNTER — Encounter: Payer: Self-pay | Admitting: Family Medicine

## 2023-02-18 NOTE — Telephone Encounter (Signed)
 I spoke with pt;  pt was seen 02/12/23 and pt thought was + for covid and the flu per lab result note pt was neg for covid but + for influenza A. Pt voiced understanding. Pt said he has not improved a lot; still has dry cough,wheezing, mid chest pain with cough and SOB upon exertion. Pt does not have any energy and today wt is 182.6; pt does not have much appetite.  Pt has been taking meds as instructed. Pt said he is in no distress at this time and wants appt at [facility]. No available appts today; pt scheduled appt with Dr Avelina on 02/19/23 at 9:00 with UC & ED precautions and pt voiced understanding. Sending note to Dr Avelina and [facility].

## 2023-02-18 NOTE — Telephone Encounter (Signed)
 noted

## 2023-02-19 ENCOUNTER — Encounter: Payer: Self-pay | Admitting: Family Medicine

## 2023-02-19 ENCOUNTER — Ambulatory Visit (INDEPENDENT_AMBULATORY_CARE_PROVIDER_SITE_OTHER)
Admission: RE | Admit: 2023-02-19 | Discharge: 2023-02-19 | Disposition: A | Discharge status: Home / Self Care | Payer: Medicare HMO | Source: Ambulatory Visit | Attending: Family Medicine | Admitting: Family Medicine

## 2023-02-19 ENCOUNTER — Ambulatory Visit (INDEPENDENT_AMBULATORY_CARE_PROVIDER_SITE_OTHER): Payer: Medicare HMO | Admitting: Family Medicine

## 2023-02-19 ENCOUNTER — Other Ambulatory Visit: Payer: Self-pay | Admitting: Family Medicine

## 2023-02-19 VITALS — BP 102/62 | HR 108 | Temp 98.1°F | Ht 66.0 in | Wt 182.6 lb

## 2023-02-19 DIAGNOSIS — R0602 Shortness of breath: Secondary | ICD-10-CM | POA: Diagnosis not present

## 2023-02-19 DIAGNOSIS — R5383 Other fatigue: Secondary | ICD-10-CM | POA: Diagnosis not present

## 2023-02-19 DIAGNOSIS — R051 Acute cough: Secondary | ICD-10-CM | POA: Diagnosis not present

## 2023-02-19 DIAGNOSIS — J111 Influenza due to unidentified influenza virus with other respiratory manifestations: Secondary | ICD-10-CM | POA: Diagnosis not present

## 2023-02-19 DIAGNOSIS — R059 Cough, unspecified: Secondary | ICD-10-CM | POA: Diagnosis not present

## 2023-02-19 LAB — CBC WITH DIFFERENTIAL/PLATELET
Basophils Absolute: 0.1 10*3/uL (ref 0.0–0.1)
Basophils Relative: 0.5 % (ref 0.0–3.0)
Eosinophils Absolute: 0.1 10*3/uL (ref 0.0–0.7)
Eosinophils Relative: 0.9 % (ref 0.0–5.0)
HCT: 45 % (ref 39.0–52.0)
Hemoglobin: 15.1 g/dL (ref 13.0–17.0)
Lymphocytes Relative: 13.2 % (ref 12.0–46.0)
Lymphs Abs: 1.6 10*3/uL (ref 0.7–4.0)
MCHC: 33.5 g/dL (ref 30.0–36.0)
MCV: 91 fL (ref 78.0–100.0)
Monocytes Absolute: 1.1 10*3/uL — ABNORMAL HIGH (ref 0.1–1.0)
Monocytes Relative: 9.3 % (ref 3.0–12.0)
Neutro Abs: 9.2 10*3/uL — ABNORMAL HIGH (ref 1.4–7.7)
Neutrophils Relative %: 76.1 % (ref 43.0–77.0)
Platelets: 396 10*3/uL (ref 150.0–400.0)
RBC: 4.94 Mil/uL (ref 4.22–5.81)
RDW: 13.2 % (ref 11.5–15.5)
WBC: 12.1 10*3/uL — ABNORMAL HIGH (ref 4.0–10.5)

## 2023-02-19 LAB — COMPREHENSIVE METABOLIC PANEL
ALT: 61 U/L — ABNORMAL HIGH (ref 0–53)
AST: 28 U/L (ref 0–37)
Albumin: 3.6 g/dL (ref 3.5–5.2)
Alkaline Phosphatase: 41 U/L (ref 39–117)
BUN: 31 mg/dL — ABNORMAL HIGH (ref 6–23)
CO2: 26 meq/L (ref 19–32)
Calcium: 9.7 mg/dL (ref 8.4–10.5)
Chloride: 103 meq/L (ref 96–112)
Creatinine, Ser: 1.55 mg/dL — ABNORMAL HIGH (ref 0.40–1.50)
GFR: 43.05 mL/min — ABNORMAL LOW (ref >60.00)
Glucose, Bld: 165 mg/dL — ABNORMAL HIGH (ref 70–99)
Potassium: 4 meq/L (ref 3.5–5.1)
Sodium: 140 meq/L (ref 135–145)
Total Bilirubin: 0.6 mg/dL (ref 0.2–1.2)
Total Protein: 7.1 g/dL (ref 6.0–8.3)

## 2023-02-19 MED ORDER — AZITHROMYCIN 250 MG PO TABS: ORAL_TABLET | ORAL | 0 refills | Status: DC

## 2023-02-19 MED ORDER — BENZONATATE 200 MG PO CAPS: 200.0000 mg | ORAL_CAPSULE | Freq: Two times a day (BID) | ORAL | 0 refills | Status: DC | PRN

## 2023-02-19 NOTE — Patient Instructions (Signed)
 Increase water intake, keep up with food..  We will call with lab results.

## 2023-02-19 NOTE — Progress Notes (Signed)
 Patient ID: Roy [name], male    DOB: July 06, 1945, 78 y.o.   MRN: [id number]  This visit was conducted in person.  BP 102/62 (BP Location: Left Arm, Patient Position: Sitting, Cuff Size: Normal)   Pulse (!) 108   Temp 98.1 F (36.7 C) (Temporal)   Ht 5' 6 (1.676 m)   Wt 182 lb 9.6 oz (82.8 kg)   SpO2 95%   BMI 29.47 kg/m    CC:  Chief Complaint  Patient presents with   Acute Visit    Reports not being able to get rid of a cough. He is also losing weight.    Subjective:   HPI: Roy [name] is a 78 y.o. male presenting on 02/19/2023 for Acute Visit (Reports not being able to get rid of a cough. He is also losing weight.)  Patient was seen on January  30 for acute cough.  Found to be positive for influenza.  Treated with Tamiflu  75 mg twice daily x 5 days as well as prednisone  taper. Today he reports continued cough, dry.  Chest wall pain. New headaches, sinus pain across face at nose  He felt no better.  Very fatigued.  No fever, having night sweats, occ confusion at night ( lives alon)  He is feeling SOB and wheeze  Has had loss of appetite, weight loss.. note eating much. Drinking Gatorade (24-32 oz per day), meal supplement.  Nml urine output.    Wt Readings from Last 3 Encounters:  02/19/23 182 lb 9.6 oz (82.8 kg)  02/12/23 194 lb 4 oz (88.1 kg)  11/26/22 191 lb (86.6 kg)         Relevant past medical, surgical, family and social history reviewed and updated as indicated. Interim medical history since our last visit reviewed. Allergies and medications reviewed and updated. Outpatient Medications Prior to Visit  Medication Sig Dispense Refill   aspirin  EC 81 MG tablet Take 81 mg by mouth daily. Swallow whole.     atorvastatin  (LIPITOR ) 80 MG tablet TAKE 1 TABLET BY MOUTH EVERY DAY 90 tablet 3   ezetimibe  (ZETIA ) 10 MG tablet Take 1 tablet (10 mg total) by mouth daily. 90 tablet 3   feeding supplement (ENSURE IMMUNE HEALTH) LIQD Take 237 mLs by  mouth in the morning.     fenofibrate  (TRICOR ) 145 MG tablet TAKE 1 TABLET BY MOUTH EVERY DAY 90 tablet 3   fluocinonide (LIDEX) 0.05 % external solution Apply 1 application topically at bedtime as needed (psoriasis).     hydrocortisone  cream 1 % Apply 1 application topically 2 (two) times daily as needed for itching.     isosorbide  mononitrate (IMDUR ) 30 MG 24 hr tablet Take 2 tablets (60 mg total) by mouth daily.     Magnesium  250 MG TABS Take 250 mg by mouth daily.     metoprolol  succinate (TOPROL -XL) 50 MG 24 hr tablet Take 1 tablet (50 mg total) by mouth daily. Take with or immediately following a meal. 90 tablet 3   nitroGLYCERIN  (NITROSTAT ) 0.4 MG SL tablet Place 1 tablet (0.4 mg total) under the tongue every 5 (five) minutes x 3 doses as needed for chest pain. 25 tablet 5   omeprazole  (PRILOSEC) 20 MG capsule TAKE 1 CAPSULE BY MOUTH EVERY DAY 90 capsule 3   oseltamivir  (TAMIFLU ) 75 MG capsule Take 1 capsule (75 mg total) by mouth 2 (two) times daily. 10 capsule 0   tamsulosin  (FLOMAX ) 0.4 MG CAPS capsule Take 1 capsule (0.4  mg total) by mouth daily. 30 capsule 3   traZODone  (DESYREL ) 50 MG tablet Take 0.5-1 tablets (25-50 mg total) by mouth at bedtime as needed. for sleep 90 tablet 0   triamcinolone (KENALOG) 0.1 % Apply 1 application topically 3 (three) times daily as needed (psoriasis).     venlafaxine  XR (EFFEXOR -XR) 75 MG 24 hr capsule TAKE 1 CAPSULE BY MOUTH DAILY WITH BREAKFAST. 90 capsule 1   benzonatate  (TESSALON ) 200 MG capsule Take 1 capsule (200 mg total) by mouth 2 (two) times daily as needed for cough. 20 capsule 0   predniSONE  (DELTASONE ) 20 MG tablet 3 tabs by mouth daily x 3 days, then 2 tabs by mouth daily x 2 days then 1 tab by mouth daily x 2 days 15 tablet 0   No facility-administered medications prior to visit.     Per HPI unless specifically indicated in ROS section below Review of Systems  Constitutional:  Positive for fatigue. Negative for fever.  HENT:   Positive for congestion. Negative for ear pain.   Eyes:  Negative for pain.  Respiratory:  Positive for cough, shortness of breath and wheezing.   Cardiovascular:  Negative for chest pain, palpitations and leg swelling.  Gastrointestinal:  Negative for abdominal pain.  Genitourinary:  Negative for dysuria.  Musculoskeletal:  Negative for arthralgias.  Neurological:  Negative for syncope, light-headedness and headaches.  Psychiatric/Behavioral:  Positive for confusion. Negative for dysphoric mood.    Objective:  BP 102/62 (BP Location: Left Arm, Patient Position: Sitting, Cuff Size: Normal)   Pulse (!) 108   Temp 98.1 F (36.7 C) (Temporal)   Ht 5' 6 (1.676 m)   Wt 182 lb 9.6 oz (82.8 kg)   SpO2 95%   BMI 29.47 kg/m   Wt Readings from Last 3 Encounters:  02/19/23 182 lb 9.6 oz (82.8 kg)  02/12/23 194 lb 4 oz (88.1 kg)  11/26/22 191 lb (86.6 kg)      Physical Exam Constitutional:      General: He is not in acute distress.    Appearance: Normal appearance. He is well-developed. He is not ill-appearing or toxic-appearing.  HENT:     Head: Normocephalic and atraumatic.     Right Ear: Hearing, tympanic membrane, ear canal and external ear normal. No tenderness. No foreign body. Tympanic membrane is not retracted or bulging.     Left Ear: Hearing, tympanic membrane, ear canal and external ear normal. No tenderness. No foreign body. Tympanic membrane is not retracted or bulging.     Nose: Nose normal. No mucosal edema or rhinorrhea.     Right Sinus: No maxillary sinus tenderness or frontal sinus tenderness.     Left Sinus: No maxillary sinus tenderness or frontal sinus tenderness.     Mouth/Throat:     Dentition: Normal dentition. No dental caries.     Pharynx: Uvula midline. No oropharyngeal exudate.     Tonsils: No tonsillar abscesses.  Eyes:     General: Lids are normal. Lids are everted, no foreign bodies appreciated.     Conjunctiva/sclera: Conjunctivae normal.     Pupils:  Pupils are equal, round, and reactive to light.  Neck:     Thyroid : No thyroid  mass or thyromegaly.     Vascular: No carotid bruit.     Trachea: Trachea and phonation normal.  Cardiovascular:     Rate and Rhythm: Normal rate and regular rhythm.     Pulses: Normal pulses.     Heart sounds: Normal heart sounds,  S1 normal and S2 normal. No murmur heard.    No gallop.  Pulmonary:     Effort: Pulmonary effort is normal. No respiratory distress.     Breath sounds: Normal breath sounds. No wheezing, rhonchi or rales.  Abdominal:     General: Bowel sounds are normal.     Palpations: Abdomen is soft.     Tenderness: There is no abdominal tenderness. There is no guarding or rebound.     Hernia: No hernia is present.  Musculoskeletal:     Cervical back: Normal range of motion and neck supple.  Skin:    General: Skin is warm and dry.     Findings: No rash.  Neurological:     Mental Status: He is alert.     Deep Tendon Reflexes: Reflexes are normal and symmetric.  Psychiatric:        Speech: Speech normal.        Behavior: Behavior normal.        Judgment: Judgment normal.       Results for orders placed or performed in visit on 02/12/23  POC COVID-19   Collection Time: 02/12/23 10:46 AM  Result Value Ref Range   SARS Coronavirus 2 Ag Negative Negative  POC Influenza A&B (Binax test)   Collection Time: 02/12/23 10:46 AM  Result Value Ref Range   Influenza A, POC Positive (A) Negative   Influenza B, POC Negative Negative    Assessment and Plan Patient with recent diagnosis 1 week ago of influenza A.  He has completed Tamiflu  5-day course.  Lung exam cleared some today compared to January 30. Patient feels he is minimally better.  He is somewhat tachycardic in office today.  Concern for dehydration or electrolyte abnormality given weight loss and patient not eating. Will evaluate with labs. Concern for possible bacterial superinfection.  Will evaluate with chest x-ray and consider  antibiotics. Recommend pushing fluids and increasing food intake, not simply relying on meal supplement. He is to having trouble resting at night.  Will refill benzonatate  and have him take 1 right prior to bed.  Will avoid sedating cough syrups given age and some confusion.  Recommend ER and return precautions.  Acute cough -     DG Chest 2 View; Future  Shortness of breath -     DG Chest 2 View; Future -     Comprehensive metabolic panel -     CBC with Differential/Platelet  Other fatigue  Other orders -     Benzonatate ; Take 1 capsule (200 mg total) by mouth 2 (two) times daily as needed for cough.  Dispense: 20 capsule; Refill: 0    No follow-ups on file.   Greig Ring, MD

## 2023-02-23 ENCOUNTER — Encounter: Payer: Self-pay | Admitting: Family Medicine

## 2023-02-24 ENCOUNTER — Other Ambulatory Visit: Payer: Self-pay | Admitting: Family Medicine

## 2023-02-24 MED ORDER — PREDNISONE 20 MG PO TABS: ORAL_TABLET | ORAL | 0 refills | Status: DC

## 2023-02-24 MED ORDER — DOXYCYCLINE HYCLATE 100 MG PO TABS: 100.0000 mg | ORAL_TABLET | Freq: Two times a day (BID) | ORAL | 0 refills | Status: DC

## 2023-03-05 ENCOUNTER — Ambulatory Visit: Payer: Medicare HMO | Admitting: Cardiology

## 2023-04-18 ENCOUNTER — Other Ambulatory Visit: Payer: Self-pay | Admitting: Family Medicine

## 2023-04-20 NOTE — Telephone Encounter (Signed)
 Please schedule CPE with fasting labs prior with Dr. Ermalene Searing after her MWV on 05/18/2023.

## 2023-04-20 NOTE — Telephone Encounter (Signed)
 Spoke to pt, scheduled cpe for 05/27/23

## 2023-05-10 ENCOUNTER — Other Ambulatory Visit: Payer: Self-pay | Admitting: Family Medicine

## 2023-05-19 ENCOUNTER — Encounter: Payer: Self-pay | Admitting: Family Medicine

## 2023-05-20 ENCOUNTER — Encounter (HOSPITAL_COMMUNITY): Payer: Self-pay

## 2023-05-20 MED ORDER — TRAZODONE HCL 50 MG PO TABS: 25.0000 mg | ORAL_TABLET | Freq: Every evening | ORAL | 1 refills | Status: DC | PRN

## 2023-05-27 ENCOUNTER — Ambulatory Visit (INDEPENDENT_AMBULATORY_CARE_PROVIDER_SITE_OTHER): Admitting: Family Medicine

## 2023-05-27 ENCOUNTER — Encounter: Payer: Self-pay | Admitting: Family Medicine

## 2023-05-27 VITALS — BP 118/62 | HR 85 | Temp 98.1°F | Ht 65.43 in | Wt 189.4 lb

## 2023-05-27 DIAGNOSIS — I152 Hypertension secondary to endocrine disorders: Secondary | ICD-10-CM

## 2023-05-27 DIAGNOSIS — G35 Multiple sclerosis: Secondary | ICD-10-CM | POA: Diagnosis not present

## 2023-05-27 DIAGNOSIS — E1169 Type 2 diabetes mellitus with other specified complication: Secondary | ICD-10-CM | POA: Diagnosis not present

## 2023-05-27 DIAGNOSIS — E1159 Type 2 diabetes mellitus with other circulatory complications: Secondary | ICD-10-CM

## 2023-05-27 DIAGNOSIS — Z0000 Encounter for general adult medical examination without abnormal findings: Secondary | ICD-10-CM | POA: Diagnosis not present

## 2023-05-27 DIAGNOSIS — F331 Major depressive disorder, recurrent, moderate: Secondary | ICD-10-CM | POA: Diagnosis not present

## 2023-05-27 DIAGNOSIS — G4733 Obstructive sleep apnea (adult) (pediatric): Secondary | ICD-10-CM | POA: Diagnosis not present

## 2023-05-27 DIAGNOSIS — E785 Hyperlipidemia, unspecified: Secondary | ICD-10-CM | POA: Diagnosis not present

## 2023-05-27 LAB — MICROALBUMIN / CREATININE URINE RATIO
Creatinine,U: 134.2 mg/dL
Microalb Creat Ratio: 6.3 mg/g (ref 0.0–30.0)
Microalb, Ur: 0.8 mg/dL (ref 0.0–1.9)

## 2023-05-27 LAB — LIPID PANEL
Cholesterol: 177 mg/dL (ref 0–200)
HDL: 57.5 mg/dL (ref >39.00)
LDL Cholesterol: 72 mg/dL (ref 0–99)
NonHDL: 119.41
Total CHOL/HDL Ratio: 3
Triglycerides: 236 mg/dL — ABNORMAL HIGH (ref 0.0–149.0)
VLDL: 47.2 mg/dL — ABNORMAL HIGH (ref 0.0–40.0)

## 2023-05-27 LAB — HEMOGLOBIN A1C: Hgb A1c MFr Bld: 6.6 % — ABNORMAL HIGH (ref 4.6–6.5)

## 2023-05-27 LAB — HM DIABETES FOOT EXAM

## 2023-05-27 NOTE — Assessment & Plan Note (Signed)
Stable, chronic.  Continue current medication.   Coreg 12.5 mg p.o. twice daily 

## 2023-05-27 NOTE — Assessment & Plan Note (Signed)
Refuses referral for repeat testing or consideration of oral appliance or CPAP.

## 2023-05-27 NOTE — Progress Notes (Signed)
 Patient ID: Roy [name], male    DOB: 1945/06/10, 78 y.o.   MRN: [id number]  This visit was conducted in person.  BP 118/62   Pulse 85   Temp 98.1 F (36.7 C) (Oral)   Ht 5' 5.43" (1.662 m)   Wt 189 lb 6.4 oz (85.9 kg)   SpO2 94%   BMI 31.10 kg/m    CC:  Chief Complaint  Patient presents with   Medicare Wellness Visit    Subjective:   HPI: Roy [name] is a 78 y.o. male presenting on 05/27/2023 for Medicare Wellness Visit  The patient presents for annual medicare wellness, complete physical and review of chronic health problems. He/She also has the following acute concerns today: On prednisone  for sinus infection.  Completed antibiotics for sinus issues after teeth removed.  I have personally reviewed the Medicare Annual Wellness questionnaire and have noted 1. The patient's medical and social history 2. Their use of alcohol, tobacco or illicit drugs 3. Their current medications and supplements 4. The patient's functional ability including ADL's, fall risks, home safety risks and hearing or visual             impairment. 5. Diet and physical activities 6. Evidence for depression or mood disorders 7.         Updated provider list Cognitive evaluation was performed and recorded on pt medicare questionnaire form. The patients weight, height, BMI and visual acuity have been recorded in the chart   I have made referrals, counseling and provided education to the patient based review of the above and I have provided the pt with a written personalized care plan for preventive services.   Documentation of this information was scanned into the electronic record under the media tab.  Hearing Screening - Comments:: Patient wears hearing aids  No falls in last 12 months.   Advance directives and end of life planning reviewed in detail with patient and documented in EMR. Patient given handout on advance care directives if needed. HCPOA and living will updated if  needed.  Flowsheet Row Office Visit from 05/27/2023 in [facility]  PHQ-2 Total Score 2       Diabetes:  Due for re-eval. Using medications without difficulties: Hypoglycemic episodes: Hyperglycemic episodes: Feet problems: none Blood Sugars averaging: not checking eye exam within last year: yes    Wt Readings from Last 3 Encounters:  05/27/23 189 lb 6.4 oz (85.9 kg)  02/19/23 182 lb 9.6 oz (82.8 kg)  02/12/23 194 lb 4 oz (88.1 kg)    Hypertension:   Well-controlled on Coreg  12.5 mg p.o. twice daily BP Readings from Last 3 Encounters:  05/27/23 118/62  02/19/23 102/62  02/12/23 100/70  Using medication without problems or lightheadedness:  none Chest pain with exertion:none, 2 episodes of chest tightness that he took nitroglycerin  for. Edema:none Short of breath:none Average home BP nones: Other issues:   Elevated Cholesterol: On fenofibrate  145 mg daily and atorvastatin  80 mg daily LDL goal < 70 given  CAD...  History of stenting to RCA in 2001. 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 Last Myoview  in 2018 was nonischemic.  Lab Results  Component Value Date   CHOL 191 04/28/2022   HDL 64.10 04/28/2022   LDLCALC  05/10/2021     Comment:     . LDL cholesterol not calculated. Triglyceride levels greater than 400 mg/dL invalidate calculated LDL results. . Reference range: <100 .  Desirable range <100 mg/dL for primary prevention;   <70 mg/dL for patients with CHD or diabetic patients  with > or = 2 CHD risk factors. Aaron Aas LDL-C is now calculated using the Martin-Hopkins  calculation, which is a validated novel method providing  better accuracy than the Friedewald equation in the  estimation of LDL-C.  Melinda Sprawls et al. Erroll Heard. 2956;213(08): 2061-2068  (http://education.[employer].com/faq/FAQ164)    LDLDIRECT 58 11/26/2022   TRIG (H) 04/28/2022    467.0 Triglyceride is over 400; calculations on Lipids are  invalid.   CHOLHDL 3 04/28/2022  Using medications without problems: Muscle aches:  Diet compliance: moderate Exercise: more issues with balance so not exercsie. 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    MDD: Well-controlled on venlafaxine  XR 75 mg daily but frustrated  with dental and sinus issues. Using sleep med as needed.   On flomax  for BPH  Relevant past medical, surgical, family and social history reviewed and updated as indicated. Interim medical history since our last visit reviewed. Allergies and medications reviewed and updated. Outpatient Medications Prior to Visit  Medication Sig Dispense Refill   methylPREDNISolone (MEDROL DOSEPAK) 4 MG TBPK tablet Take 4 mg by mouth daily.     sulfamethoxazole-trimethoprim (BACTRIM DS) 800-160 MG tablet Take 1 tablet by mouth 2 (two) times daily.     aspirin  EC 81 MG tablet Take 81 mg by mouth daily. Swallow whole.     atorvastatin  (LIPITOR ) 80 MG tablet TAKE 1 TABLET BY MOUTH EVERY DAY 90 tablet 3   azithromycin  (ZITHROMAX ) 250 MG tablet 2 tab po x 1 day then 1 tab po daily 6 tablet 0   benzonatate  (TESSALON ) 200 MG capsule Take 1 capsule (200 mg total) by mouth 2 (two) times daily as needed for cough. 20 capsule 0   doxycycline  (VIBRA -TABS) 100 MG tablet Take 1 tablet (100 mg total) by mouth 2 (two) times daily. 20 tablet 0   ezetimibe  (ZETIA ) 10 MG tablet Take 1 tablet (10 mg total) by mouth daily. 90 tablet 3   feeding supplement (ENSURE IMMUNE HEALTH) LIQD Take 237 mLs by mouth in the morning.     fenofibrate  (TRICOR ) 145 MG tablet TAKE 1 TABLET BY MOUTH EVERY DAY 90 tablet 0   fluocinonide (LIDEX) 0.05 % external solution Apply 1 application topically at bedtime as needed (psoriasis).     hydrocortisone  cream 1 % Apply 1 application topically 2 (two) times daily as needed for itching.     isosorbide  mononitrate (IMDUR ) 30  MG 24 hr tablet Take 2 tablets (60 mg total) by mouth daily.     Magnesium  250 MG TABS Take 250 mg by mouth daily.     metoprolol  succinate (TOPROL -XL) 50 MG 24 hr tablet Take 1 tablet (50 mg total) by mouth daily. Take with or immediately following a meal. 90 tablet 3   nitroGLYCERIN  (NITROSTAT ) 0.4 MG SL tablet Place 1 tablet (0.4 mg total) under the tongue every 5 (five) minutes x 3 doses as needed for chest pain. 25 tablet 5   omeprazole  (PRILOSEC) 20 MG capsule TAKE 1 CAPSULE BY MOUTH EVERY DAY 90 capsule 3   oseltamivir  (TAMIFLU ) 75 MG capsule Take 1 capsule (75 mg total) by mouth 2 (two) times daily. 10 capsule 0   predniSONE  (DELTASONE ) 20 MG tablet 3 tabs by mouth daily x 3 days, then 2 tabs by mouth daily x  2 days then 1 tab by mouth daily x 2 days 15 tablet 0   tamsulosin  (FLOMAX ) 0.4 MG CAPS capsule TAKE 1 CAPSULE BY MOUTH EVERY DAY 90 capsule 0   traZODone  (DESYREL ) 50 MG tablet Take 0.5-1 tablets (25-50 mg total) by mouth at bedtime as needed. for sleep 90 tablet 1   triamcinolone (KENALOG) 0.1 % Apply 1 application topically 3 (three) times daily as needed (psoriasis).     venlafaxine  XR (EFFEXOR -XR) 75 MG 24 hr capsule TAKE 1 CAPSULE BY MOUTH DAILY WITH BREAKFAST. 90 capsule 1   No facility-administered medications prior to visit.     Per HPI unless specifically indicated in ROS section below Review of Systems  Constitutional:  Negative for fatigue and fever.  HENT:  Negative for ear pain.   Eyes:  Negative for pain.  Respiratory:  Negative for cough and shortness of breath.   Cardiovascular:  Negative for chest pain, palpitations and leg swelling.  Gastrointestinal:  Negative for abdominal pain.  Genitourinary:  Negative for dysuria.  Musculoskeletal:  Negative for arthralgias.  Neurological:  Negative for syncope, light-headedness and headaches.  Psychiatric/Behavioral:  Negative for dysphoric mood.    Objective:  BP 118/62   Pulse 85   Temp 98.1 F (36.7 C) (Oral)    Ht 5' 5.43" (1.662 m)   Wt 189 lb 6.4 oz (85.9 kg)   SpO2 94%   BMI 31.10 kg/m   Wt Readings from Last 3 Encounters:  05/27/23 189 lb 6.4 oz (85.9 kg)  02/19/23 182 lb 9.6 oz (82.8 kg)  02/12/23 194 lb 4 oz (88.1 kg)      Physical Exam Constitutional:      General: He is not in acute distress.    Appearance: Normal appearance. He is well-developed. He is not ill-appearing or toxic-appearing.  HENT:     Head: Normocephalic and atraumatic.     Right Ear: Hearing, tympanic membrane, ear canal and external ear normal.     Left Ear: Hearing, tympanic membrane, ear canal and external ear normal.     Nose: Nose normal.     Mouth/Throat:     Pharynx: Uvula midline.  Eyes:     General: Lids are normal. Lids are everted, no foreign bodies appreciated.     Conjunctiva/sclera: Conjunctivae normal.     Pupils: Pupils are equal, round, and reactive to light.  Neck:     Thyroid : No thyroid  mass or thyromegaly.     Vascular: No carotid bruit.     Trachea: Trachea and phonation normal.  Cardiovascular:     Rate and Rhythm: Normal rate and regular rhythm.     Pulses: Normal pulses.     Heart sounds: S1 normal and S2 normal. Heart sounds not distant. No murmur heard.    No friction rub. No gallop.     Comments: No peripheral edema Pulmonary:     Effort: Pulmonary effort is normal. No respiratory distress.     Breath sounds: Normal breath sounds. No wheezing, rhonchi or rales.  Abdominal:     General: Bowel sounds are normal.     Palpations: Abdomen is soft.     Tenderness: There is no abdominal tenderness. There is no guarding or rebound.     Hernia: No hernia is present.  Genitourinary:    Rectum: Normal. Guaiac result negative.  Musculoskeletal:     Cervical back: Normal range of motion and neck supple.  Lymphadenopathy:     Cervical: No cervical adenopathy.  Skin:  General: Skin is warm and dry.     Findings: No rash.  Neurological:     Mental Status: He is alert.      Cranial Nerves: No cranial nerve deficit.     Sensory: No sensory deficit.     Gait: Gait normal.     Deep Tendon Reflexes: Reflexes are normal and symmetric.  Psychiatric:        Speech: Speech normal.        Behavior: Behavior normal.        Thought Content: Thought content normal.        Judgment: Judgment normal.    Diabetic foot exam: Normal inspection No skin breakdown No calluses  Normal DP pulses Normal sensation to light touch and monofilament Nails normal     Results for orders placed or performed in visit on 05/27/23  HM DIABETES FOOT EXAM   Collection Time: 05/27/23 12:00 AM  Result Value Ref Range   HM Diabetic Foot Exam done     Assessment and Plan  Vaccines: Consider tetanus vaccine at the pharmacy Prostate Cancer Screen: Not indicated due to age Colon Cancer Screen: 07/26/2019, discontinued given age      Smoking Status: Former ETOH/ drug use: None/none  Hep C: Done    Medicare annual wellness visit, subsequent  Controlled type 2 diabetes mellitus with other circulatory complication, without long-term current use of insulin  (HCC) Assessment & Plan: Due for re-eval with A1C. Diet controlled in past  Orders: -     Hemoglobin A1c -     Lipid panel -     Microalbumin / creatinine urine ratio  Hypertension associated with diabetes (HCC) Assessment & Plan: Stable, chronic.  Continue current medication.   Coreg  12.5 mg p.o. twice daily   Hyperlipidemia associated with type 2 diabetes mellitus (HCC) Assessment & Plan:  Due for re-eval   Atorvastatin  80 mg daily Zetia  10 mg daily Fenofibrate  145 mg daily   Moderate episode of recurrent major depressive disorder (HCC) Assessment & Plan: Well-controlled on venlafaxine  XR 75 mg daily   Personal history of multiple sclerosis (HCC) Assessment & Plan: Chronic, followed by neurology  Dr. Godwin Lat   Obstructive sleep apnea Assessment & Plan: Refuses referral for repeat testing or  consideration of oral appliance or CPAP.      No follow-ups on file.   Herby Lolling, MD

## 2023-05-27 NOTE — Assessment & Plan Note (Signed)
 Due for re-eval with A1C. Diet controlled in past

## 2023-05-27 NOTE — Assessment & Plan Note (Signed)
 Due for re-eval   Atorvastatin  80 mg daily Zetia  10 mg daily Fenofibrate  145 mg daily

## 2023-05-27 NOTE — Patient Instructions (Addendum)
 Call Dr. Godwin Lat for follow up neurology given continued balance issue and to discuss test results. [facility] [phone] [address] [place], [place] [address] [phone]

## 2023-05-27 NOTE — Assessment & Plan Note (Signed)
 Well-controlled on venlafaxine  XR 75 mg daily

## 2023-05-27 NOTE — Assessment & Plan Note (Addendum)
 Chronic, followed by neurology  Dr. Godwin Lat

## 2023-05-29 ENCOUNTER — Ambulatory Visit: Payer: Self-pay | Admitting: Family Medicine

## 2023-05-29 DIAGNOSIS — J4489 Other specified chronic obstructive pulmonary disease: Secondary | ICD-10-CM | POA: Diagnosis not present

## 2023-05-29 DIAGNOSIS — M1990 Unspecified osteoarthritis, unspecified site: Secondary | ICD-10-CM | POA: Diagnosis not present

## 2023-05-29 DIAGNOSIS — I739 Peripheral vascular disease, unspecified: Secondary | ICD-10-CM | POA: Diagnosis not present

## 2023-05-29 DIAGNOSIS — I25119 Atherosclerotic heart disease of native coronary artery with unspecified angina pectoris: Secondary | ICD-10-CM | POA: Diagnosis not present

## 2023-05-29 DIAGNOSIS — N1831 Chronic kidney disease, stage 3a: Secondary | ICD-10-CM | POA: Diagnosis not present

## 2023-05-29 DIAGNOSIS — G35 Multiple sclerosis: Secondary | ICD-10-CM | POA: Diagnosis not present

## 2023-05-29 DIAGNOSIS — Z008 Encounter for other general examination: Secondary | ICD-10-CM | POA: Diagnosis not present

## 2023-05-29 DIAGNOSIS — I130 Hypertensive heart and chronic kidney disease with heart failure and stage 1 through stage 4 chronic kidney disease, or unspecified chronic kidney disease: Secondary | ICD-10-CM | POA: Diagnosis not present

## 2023-05-29 DIAGNOSIS — M069 Rheumatoid arthritis, unspecified: Secondary | ICD-10-CM | POA: Diagnosis not present

## 2023-05-29 DIAGNOSIS — F324 Major depressive disorder, single episode, in partial remission: Secondary | ICD-10-CM | POA: Diagnosis not present

## 2023-05-29 DIAGNOSIS — Z8249 Family history of ischemic heart disease and other diseases of the circulatory system: Secondary | ICD-10-CM | POA: Diagnosis not present

## 2023-05-29 DIAGNOSIS — I509 Heart failure, unspecified: Secondary | ICD-10-CM | POA: Diagnosis not present

## 2023-05-29 DIAGNOSIS — K219 Gastro-esophageal reflux disease without esophagitis: Secondary | ICD-10-CM | POA: Diagnosis not present

## 2023-06-16 ENCOUNTER — Telehealth: Payer: Self-pay | Admitting: Family Medicine

## 2023-06-16 NOTE — Telephone Encounter (Signed)
 Copied from CRM [id number]. Topic: General - Other >> Jun 16, 2023 10:58 AM Jenice Mitts wrote: Reason for CRM: Sarah from [facility] is calling to leave information in home health assessment  he was given blood test and his EGFR  results were abnormal  at 46  They let the  know patient needs a follow-up appointment  with his pcp they also mailed the information which takes around 10-14 business days to receive  Call back number if needed is [phone]

## 2023-06-17 NOTE — Telephone Encounter (Signed)
 Noted. Will review upon arrival. If pt calls: This is improved from our last GFR.Aaron Aas we will continue to follow nothing additional needed at this time.

## 2023-07-09 ENCOUNTER — Other Ambulatory Visit: Payer: Self-pay | Admitting: Cardiovascular Disease

## 2023-07-09 ENCOUNTER — Other Ambulatory Visit: Payer: Self-pay | Admitting: Family Medicine

## 2023-07-09 DIAGNOSIS — E782 Mixed hyperlipidemia: Secondary | ICD-10-CM

## 2023-07-09 DIAGNOSIS — I10 Essential (primary) hypertension: Secondary | ICD-10-CM

## 2023-07-09 DIAGNOSIS — I209 Angina pectoris, unspecified: Secondary | ICD-10-CM

## 2023-07-16 ENCOUNTER — Other Ambulatory Visit: Payer: Self-pay | Admitting: Family Medicine

## 2023-08-10 ENCOUNTER — Ambulatory Visit (INDEPENDENT_AMBULATORY_CARE_PROVIDER_SITE_OTHER): Admitting: Family Medicine

## 2023-08-10 ENCOUNTER — Encounter: Payer: Self-pay | Admitting: Family Medicine

## 2023-08-10 ENCOUNTER — Ambulatory Visit: Payer: Self-pay

## 2023-08-10 VITALS — BP 118/82 | HR 133 | Temp 97.8°F | Ht 65.43 in | Wt 182.0 lb

## 2023-08-10 DIAGNOSIS — I2510 Atherosclerotic heart disease of native coronary artery without angina pectoris: Secondary | ICD-10-CM | POA: Diagnosis not present

## 2023-08-10 DIAGNOSIS — E1159 Type 2 diabetes mellitus with other circulatory complications: Secondary | ICD-10-CM

## 2023-08-10 DIAGNOSIS — U071 COVID-19: Secondary | ICD-10-CM | POA: Insufficient documentation

## 2023-08-10 DIAGNOSIS — I152 Hypertension secondary to endocrine disorders: Secondary | ICD-10-CM | POA: Diagnosis not present

## 2023-08-10 DIAGNOSIS — E1169 Type 2 diabetes mellitus with other specified complication: Secondary | ICD-10-CM

## 2023-08-10 DIAGNOSIS — Z9861 Coronary angioplasty status: Secondary | ICD-10-CM | POA: Diagnosis not present

## 2023-08-10 DIAGNOSIS — E785 Hyperlipidemia, unspecified: Secondary | ICD-10-CM

## 2023-08-10 DIAGNOSIS — R000 Tachycardia, unspecified: Secondary | ICD-10-CM | POA: Diagnosis not present

## 2023-08-10 LAB — POC COVID19 BINAXNOW: SARS Coronavirus 2 Ag: POSITIVE — AB

## 2023-08-10 MED ORDER — AMOXICILLIN-POT CLAVULANATE 875-125 MG PO TABS: 1.0000 | ORAL_TABLET | Freq: Two times a day (BID) | ORAL | 0 refills | Status: AC

## 2023-08-10 MED ORDER — NIRMATRELVIR/RITONAVIR (PAXLOVID) TABLET (RENAL DOSING): 2.0000 | ORAL_TABLET | Freq: Two times a day (BID) | ORAL | 0 refills | Status: AC

## 2023-08-10 NOTE — Assessment & Plan Note (Addendum)
 Reviewed currently approved antiviral treatments.  Reviewed expected course of illness, anticipated course of recovery, as well as red flags to suggest COVID pneumonia and/or to seek urgent in-person care. Reviewed CDC isolation/quarantine guidelines.  Encouraged fluids and rest. Reviewed further supportive care measures at home including vit C 500mg  bid, vit D 2000 IU daily, zinc 100mg  daily, tylenol  PRN, pepcid  20mg  BID PRN.   Tachycardic but otherwise vitals stable - ok for outpatient treatment, but reviewed red flags to seek further care.  Recommend:  Renally dosed paxlovid  Paxlovid  drug interactions:  Atorvastatin  - hold while on paxlovid .  Tamsulosin  - hold while on paxlovid .  Trazodone  - cut in half while on paxlovid .   paxlovid .[email or web address]

## 2023-08-10 NOTE — Telephone Encounter (Signed)
 FYI Only or Action Required?: FYI only for provider.  Patient was last seen in primary care on 05/27/2023 by Avelina Greig BRAVO, MD.  Called Nurse Triage reporting Chest Pain, weakness, SOB, thinks he has an URI.  Symptoms began several days ago.  Interventions attempted: Other: rest.  Symptoms are: unchanged.  Triage Disposition: See Physician Within 24 Hours  Patient/caregiver understands and will follow disposition?: Yes                Copied from CRM [id number]. Topic: Clinical - Red Word Triage >> Aug 10, 2023  8:06 AM Roy [name] wrote: Red Word that prompted transfer to Nurse Triage: Patient stated he's experiencing chest pains, dizziness and weakness and possible upper respiratory infection. Reason for Disposition  [1] Chest pain lasts > 5 minutes AND [2] occurred > 3 days ago (72 hours) AND [3] NO chest pain or cardiac symptoms now  Answer Assessment - Initial Assessment Questions 1. LOCATION: Where does it hurt?       Right in the middle 2. RADIATION: Does the pain go anywhere else? (e.g., into neck, jaw, arms, back)     chest 3. ONSET: When did the chest pain begin? (Minutes, hours or days)      4-5 days ago 4. PATTERN: Does the pain come and go, or has it been constant since it started?  Does it get worse with exertion?      Comes and goes 5. DURATION: How long does it last (e.g., seconds, minutes, hours)     1-2 minutes 6. SEVERITY: How bad is the pain?  (e.g., Scale 1-10; mild, moderate, or severe)     3/10 7. CARDIAC RISK FACTORS: Do you have any history of heart problems or risk factors for heart disease? (e.g., angina, prior heart attack; diabetes, high blood pressure, high cholesterol, smoker, or strong family history of heart disease)     2 stents 8. PULMONARY RISK FACTORS: Do you have any history of lung disease?  (e.g., blood clots in lung, asthma, emphysema, birth control pills)     no 9. CAUSE: What do you think is causing the chest  pain?     Inactivity and old age. 10. OTHER SYMPTOMS: Do you have any other symptoms? (e.g., dizziness, nausea, vomiting, sweating, fever, difficulty breathing, cough)       Leg cramps, weakness, fatigue  Protocols used: Chest Pain-A-AH

## 2023-08-10 NOTE — Progress Notes (Signed)
 Ph: [phone] Fax: [phone]   Patient ID: Roy [name], male    DOB: February 09, 1945, 78 y.o.   MRN: [id number]  This visit was conducted in person.  BP 118/82   Pulse (!) 133   Temp 97.8 F (36.6 C) (Oral)   Ht 5' 5.43 (1.662 m)   Wt 182 lb (82.6 kg)   SpO2 94%   BMI 29.89 kg/m   BP Readings from Last 3 Encounters:  08/10/23 118/82  05/27/23 118/62  02/19/23 102/62    Pulse Readings from Last 3 Encounters:  08/10/23 (!) 133  05/27/23 85  02/19/23 (!) 108    CC: URI, fatigue  Subjective:   HPI: Roy [name] is a 78 y.o. male presenting on 08/10/2023 for Fatigue (Pt complains of cough, congestion, cough, dizziness./Pt states it has been happening for a couple of weeks. Started taking Robitussin DM for cough 3 days ago. )   Pleasant patient of Dr Sherrel who  has a past medical history of Basal cell carcinoma, CAD (coronary artery disease), History of echocardiogram, HLD (hyperlipidemia), HTN (hypertension), MS (multiple sclerosis) (HCC), OSA (obstructive sleep apnea), Pre-diabetes, and Vertigo.   Several days of fatigue, cough, upper respiratory congestion. Head > chest congestion. Dyspnea started 4 d ago as well. Mild wheezing. Has felt some palpitations at night for last few nights.  Started feeling bad in airport on return trip from the [place] - had to be brought a wheelchair to take him to his gate.   No fevers/chills, ST, ear or tooth pain, PNdrainage. Denies chest pain.  No h/o asthma, copd, smoking.  No known sick contacts.   Also endorses 2 months of sinus discharge draingae and odor that started after dental work by oral surgery - took 2 oral courses of antibiotics - looks like possibly bactrim and medrol dosepak as well. Ongoing sinus symptoms despite this.   H/o OSA, not on CPAP. H/o MS - in recession for 20 yrs. Possible recurrent symptoms after diverticulitis surgery 7 yrs ago - has not yet established with neurology.   First day of  symptoms: 08/07/2023 Tested COVID positive: 08/10/2023  Current symptoms: see above Treatments to date: none Risk factors include: age, gender, CAD hx, DM , HTN COVID vaccination status: x at least 4   Latest GFR 43 (02/2023)     Relevant past medical, surgical, family and social history reviewed and updated as indicated. Interim medical history since our last visit reviewed. Allergies and medications reviewed and updated. Outpatient Medications Prior to Visit  Medication Sig Dispense Refill   aspirin  EC 81 MG tablet Take 81 mg by mouth daily. Swallow whole.     atorvastatin  (LIPITOR ) 80 MG tablet TAKE 1 TABLET BY MOUTH EVERY DAY 90 tablet 3   ezetimibe  (ZETIA ) 10 MG tablet Take 1 tablet (10 mg total) by mouth daily. 90 tablet 3   feeding supplement (ENSURE IMMUNE HEALTH) LIQD Take 237 mLs by mouth in the morning.     fenofibrate  (TRICOR ) 145 MG tablet TAKE 1 TABLET BY MOUTH EVERY DAY 90 tablet 3   fluocinonide (LIDEX) 0.05 % external solution Apply 1 application topically at bedtime as needed (psoriasis).     hydrocortisone  cream 1 % Apply 1 application topically 2 (two) times daily as needed for itching.     isosorbide  mononitrate (IMDUR ) 30 MG 24 hr tablet Take 2 tablets (60 mg total) by mouth daily.     Magnesium  250 MG TABS Take 250 mg by mouth daily.  metoprolol  succinate (TOPROL -XL) 50 MG 24 hr tablet Take 1 tablet (50 mg total) by mouth daily. Take with or immediately following a meal. 90 tablet 3   nitroGLYCERIN  (NITROSTAT ) 0.4 MG SL tablet Place 1 tablet (0.4 mg total) under the tongue every 5 (five) minutes x 3 doses as needed for chest pain. 25 tablet 5   omeprazole  (PRILOSEC) 20 MG capsule TAKE 1 CAPSULE BY MOUTH EVERY DAY 90 capsule 3   tamsulosin  (FLOMAX ) 0.4 MG CAPS capsule TAKE 1 CAPSULE BY MOUTH EVERY DAY 90 capsule 3   traZODone  (DESYREL ) 50 MG tablet Take 0.5-1 tablets (25-50 mg total) by mouth at bedtime as needed. for sleep 90 tablet 1   triamcinolone (KENALOG) 0.1 %  Apply 1 application topically 3 (three) times daily as needed (psoriasis).     venlafaxine  XR (EFFEXOR -XR) 75 MG 24 hr capsule TAKE 1 CAPSULE BY MOUTH DAILY WITH BREAKFAST. 90 capsule 1   azithromycin  (ZITHROMAX ) 250 MG tablet 2 tab po x 1 day then 1 tab po daily 6 tablet 0   benzonatate  (TESSALON ) 200 MG capsule Take 1 capsule (200 mg total) by mouth 2 (two) times daily as needed for cough. 20 capsule 0   doxycycline  (VIBRA -TABS) 100 MG tablet Take 1 tablet (100 mg total) by mouth 2 (two) times daily. 20 tablet 0   methylPREDNISolone (MEDROL DOSEPAK) 4 MG TBPK tablet Take 4 mg by mouth daily.     oseltamivir  (TAMIFLU ) 75 MG capsule Take 1 capsule (75 mg total) by mouth 2 (two) times daily. 10 capsule 0   predniSONE  (DELTASONE ) 20 MG tablet 3 tabs by mouth daily x 3 days, then 2 tabs by mouth daily x 2 days then 1 tab by mouth daily x 2 days 15 tablet 0   sulfamethoxazole-trimethoprim (BACTRIM DS) 800-160 MG tablet Take 1 tablet by mouth 2 (two) times daily.     No facility-administered medications prior to visit.     Per HPI unless specifically indicated in ROS section below Review of Systems  Objective:  BP 118/82   Pulse (!) 133   Temp 97.8 F (36.6 C) (Oral)   Ht 5' 5.43 (1.662 m)   Wt 182 lb (82.6 kg)   SpO2 94%   BMI 29.89 kg/m   Wt Readings from Last 3 Encounters:  08/10/23 182 lb (82.6 kg)  05/27/23 189 lb 6.4 oz (85.9 kg)  02/19/23 182 lb 9.6 oz (82.8 kg)      Physical Exam Vitals and nursing note reviewed.  Constitutional:      Appearance: Normal appearance. He is not ill-appearing.  HENT:     Head: Normocephalic and atraumatic.     Right Ear: Hearing, tympanic membrane, ear canal and external ear normal. There is no impacted cerumen.     Left Ear: Hearing, tympanic membrane, ear canal and external ear normal. There is no impacted cerumen.     Nose: Nose normal. No mucosal edema, congestion or rhinorrhea.     Right Turbinates: Not enlarged or swollen.     Left  Turbinates: Not enlarged or swollen.     Right Sinus: No maxillary sinus tenderness or frontal sinus tenderness.     Left Sinus: No maxillary sinus tenderness or frontal sinus tenderness.     Mouth/Throat:     Mouth: Mucous membranes are moist.     Pharynx: Oropharynx is clear. No oropharyngeal exudate or posterior oropharyngeal erythema.  Eyes:     Extraocular Movements: Extraocular movements intact.     Conjunctiva/sclera:  Conjunctivae normal.     Pupils: Pupils are equal, round, and reactive to light.  Cardiovascular:     Rate and Rhythm: Regular rhythm. Tachycardia present.     Pulses: Normal pulses.     Heart sounds: Normal heart sounds. No murmur heard.    Comments: Sounds regular Pulmonary:     Effort: Pulmonary effort is normal. No respiratory distress.     Breath sounds: Normal breath sounds. No wheezing, rhonchi or rales.     Comments: Lungs largely clear, but deep cough present  Musculoskeletal:     Cervical back: Normal range of motion and neck supple. No rigidity.     Right lower leg: No edema.     Left lower leg: No edema.  Lymphadenopathy:     Cervical: No cervical adenopathy.  Skin:    General: Skin is warm and dry.     Findings: No rash.  Neurological:     Mental Status: He is alert.  Psychiatric:        Mood and Affect: Mood normal.        Behavior: Behavior normal.       Results for orders placed or performed in visit on 08/10/23  POC COVID-19 BinaxNow   Collection Time: 08/10/23 10:25 AM  Result Value Ref Range   SARS Coronavirus 2 Ag Positive (A) Negative   Lab Results  Component Value Date   NA 140 02/19/2023   CL 103 02/19/2023   K 4.0 02/19/2023   CO2 26 02/19/2023   BUN 31 (H) 02/19/2023   CREATININE 1.55 (H) 02/19/2023   GFR 43.05 (L) 02/19/2023   CALCIUM  9.7 02/19/2023   PHOS 3.4 09/06/2019   ALBUMIN 3.6 02/19/2023   GLUCOSE 165 (H) 02/19/2023    EKG - sinus tachycardia rate 130s, LAD with LAFB, no acute ST/T changes, q waves  inferiorly unchanged from prior    Assessment & Plan:   Problem List Items Addressed This Visit     Hyperlipidemia associated with type 2 diabetes mellitus (HCC)   Hypertension associated with diabetes (HCC)   Coronary artery disease involving native coronary artery of native heart without angina pectoris   CAD, RCA PCI 2001, urgent Dx2 DES 04/24/14   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  Reviewed expected course of illness, anticipated course of recovery, as well as red flags to suggest COVID pneumonia and/or to seek urgent in-person care. Reviewed CDC isolation/quarantine guidelines.  Encouraged fluids and rest. Reviewed further supportive care measures at home including vit C 500mg  bid, vit D 2000 IU daily, zinc 100mg  daily, tylenol  PRN, pepcid  20mg  BID PRN.   Tachycardic but otherwise vitals stable - ok for outpatient treatment, but reviewed red flags to seek further care.  Recommend:  Renally dosed paxlovid  Paxlovid  drug interactions:  Atorvastatin  - hold while on paxlovid .  Tamsulosin  - hold while on paxlovid .  Trazodone  - cut in half while on paxlovid .   paxlovid .[email or web address]       Relevant Medications   nirmatrelvir /ritonavir , renal dosing, (PAXLOVID ) 10 x 150 MG & 10 x 100MG  TABS   Other Relevant Orders   POC COVID-19 BinaxNow (Completed)   Tachycardia   EKG today - sinus tachycardia, p waves present.  Reviewed red flags to seek ER care.       Relevant Orders   EKG 12-Lead     Meds ordered this encounter  Medications   nirmatrelvir /ritonavir , renal  dosing, (PAXLOVID ) 10 x 150 MG & 10 x 100MG  TABS    Sig: Take 2 tablets by mouth 2 (two) times daily for 5 days. (Take nirmatrelvir  150 mg one tablet twice daily for 5 days and ritonavir  100 mg one tablet twice daily for 5 days) Patient GFR is 43    Dispense:  20 tablet    Refill:  0    amoxicillin -clavulanate (AUGMENTIN ) 875-125 MG tablet    Sig: Take 1 tablet by mouth 2 (two) times daily for 10 days.    Dispense:  20 tablet    Refill:  0    Orders Placed This Encounter  Procedures   POC COVID-19 BinaxNow   EKG 12-Lead    Patient Instructions  EKG today  COVID swab today positive Take antiviral Paxlovid  sent to pharmacy.  Hold atorvastatin  while you're on paxlovid .  Hold tamsulosin  (flomax ) while you're on paxlovid .  Cut trazodone  in half while on paxlovid .  May take augmentin  antibiotic for ongoing sinusitis component of symptoms.   If paxlovid  unaffordable, you may go to below website for discount.  paxlovid [email or web address]   Follow up plan: No follow-ups on file.  Anton Blas, MD

## 2023-08-10 NOTE — Assessment & Plan Note (Addendum)
 EKG today - sinus tachycardia, p waves present.  Reviewed red flags to seek ER care.

## 2023-08-10 NOTE — Patient Instructions (Addendum)
 EKG today  COVID swab today positive Take antiviral Paxlovid  sent to pharmacy.  Hold atorvastatin  while you're on paxlovid .  Hold tamsulosin  (flomax ) while you're on paxlovid .  Cut trazodone  in half while on paxlovid .  May take augmentin  antibiotic for ongoing sinusitis component of symptoms.   If paxlovid  unaffordable, you may go to below website for discount.  paxlovid .[email or web address]

## 2023-08-13 ENCOUNTER — Encounter: Payer: Self-pay | Admitting: Family Medicine

## 2023-08-15 ENCOUNTER — Ambulatory Visit: Payer: Self-pay | Admitting: Family Medicine

## 2023-08-17 ENCOUNTER — Telehealth: Payer: Self-pay | Admitting: *Deleted

## 2023-08-17 NOTE — Telephone Encounter (Signed)
 I spoke with pt;pt said he finished the paxlovid  on 08/16/23. Pt is still taking augmentin  875-125 mg bid as instructed.pt said that he is still feeling tired, fatigued and not very mobile at this time. Pt said the upper respiratory congestion has cleared up a lot, pt does still have some prod cough with yellow phlegm and when pt blows his nose he has yellow mucus. Pt does not have any CP,SOB,H/A or dizziness and no fever. Pt wanted to emphasize he is mostly resting.  Pt wants to know what is next step since finished covid med. Pt has not taken any of the other meds mentioned at last appt.pt is trying to rest and stay hydrated but is not taking Vit C,Vit D,Zinc,Tylenol  or Pepcid . Pt also wants to know since finished paxlovid  on 08/16/23 when should pt restart Atorvastin,Tamsulosin  and Trazodone .. [facility] [employer] [facility] [address]. Pt request cb today if possible after reviewed by Dr KANDICE. UC & ED precautions given and pt voiced understanding., Sending note to  Dr KANDICE Ring pool and will let G pool to make sure he sees this note.,

## 2023-08-17 NOTE — Telephone Encounter (Signed)
 I spoke with Dr KANDICE and he said pt cold restart the Atorvastin,Tamsulosin  and Trazodone  since has finished taking the paxlovid  on 08/16/23. Pt voiced understanding.pt could not find the BP meter to ck pts pulse; pt could not feel his pulse at his wrist either. Pt said he can say that he has not felt like he felt when first having covid symptoms in his chest. If pt finds BP meter  he will ck BP and pulse and cb to [facility] with reading. Dr KANDICE said taking the Vitamin c and D might help pt. Pt wrote down taking OTC Vit C 500 mg bid and Vit D 2000 IU daily; pt said he will cb if symptoms do not completely go away when finishes abx. ( The colored mucus and phlegm)_. UC & ED precautions given and pt voiced understanding. Sending note to Dr Durwood RICK.

## 2023-08-17 NOTE — Telephone Encounter (Unsigned)
 Copied from CRM #[id number]. Topic: Clinical - Medical Advice >> Aug 17, 2023  2:06 PM Paige D wrote: Reason for CRM: Pt called stated he finished taking all his covid meds he's concerned on next steps and if he should keep taking meds or start his meds he would like a call back today as he is concerned

## 2023-08-17 NOTE — Telephone Encounter (Signed)
 First day of symptoms: 08/07/2023 Tested COVID positive: 08/10/2023  Started renally dosed paxlovid   08/10/2023  How is he feeling, what ongoing symptoms is he having?

## 2023-08-17 NOTE — Telephone Encounter (Addendum)
 Noted. Agree. Thank you.  Would offer cough medicine depending on how bothersome cough is- could try tessalon  perls if desired  Benzonatate  100mg  TID PO PRN cough #30 RF0.

## 2023-08-17 NOTE — Telephone Encounter (Signed)
 Copied from CRM [id number]. Topic: General - Other >> Aug 17, 2023  8:36 AM Rosina BIRCH wrote: Reason for CRM: patient called stating he finished his covid medication and he does not know the next step CB [phone]

## 2023-08-18 NOTE — Telephone Encounter (Signed)
 Spoke with pt relaying Dr Talmadge message. Pt expresses his thanks but declines cough med at this time. States he doesn't need it.

## 2023-09-02 NOTE — Progress Notes (Signed)
 EKG in chart at this time for 7.28.25 dos

## 2023-09-24 DIAGNOSIS — H52223 Regular astigmatism, bilateral: Secondary | ICD-10-CM | POA: Diagnosis not present

## 2023-09-24 LAB — OPHTHALMOLOGY REPORT-SCANNED

## 2023-10-03 ENCOUNTER — Other Ambulatory Visit: Payer: Self-pay | Admitting: Cardiology

## 2023-10-19 DIAGNOSIS — L218 Other seborrheic dermatitis: Secondary | ICD-10-CM | POA: Diagnosis not present

## 2023-10-19 DIAGNOSIS — C44719 Basal cell carcinoma of skin of left lower limb, including hip: Secondary | ICD-10-CM | POA: Diagnosis not present

## 2023-10-21 ENCOUNTER — Ambulatory Visit: Payer: Self-pay | Admitting: Family Medicine

## 2023-10-21 NOTE — Telephone Encounter (Signed)
 FYI Only or Action Required?: FYI only for provider.  Patient was last seen in primary care on 08/10/2023 by Rilla Baller, MD.  Called Nurse Triage reporting Headache.  Symptoms began several days ago.  Interventions attempted: OTC medications:  SABRA  Symptoms are: unchanged.  Triage Disposition: See Physician Within 24 Hours  Patient/caregiver understands and will follow disposition?: Yes             Copied from CRM #[id number]. Topic: Clinical - Red Word Triage >> Oct 21, 2023  2:32 PM Roy [name] wrote: [facility] that prompted transfer to Nurse Triage: Patient has a severe sinus headache. Reason for Disposition  [1] MODERATE headache (e.g., interferes with normal activities) AND [2] present > 24 hours AND [3] unexplained  (Exceptions: Pain medicines not tried, typical migraine, or headache part of viral illness.)  Answer Assessment - Initial Assessment Questions This RN scheduled pt for an appointment tomorrow with PCP in office. This RN educated pt on when to call back/seek emergent care. Pt verbalized understanding and agrees to plan.    Pt states he has a severe sinus headache, over left eye Pt states he has taken OTC medications and nothing is helping Pt is requesting an antibiotic   ONSET: When did the headache start? (e.g., minutes, hours, days)      Three days ago  PATTERN: Does the pain come and go, or has it been constant since it started?     Constant  SEVERITY: How bad is the pain? and What does it keep you from doing?  (e.g., Scale 1-10; mild, moderate, or severe)     8/10 pain level  RECURRENT SYMPTOM: Have you ever had headaches before? If Yes, ask: When was the last time? and What happened that time?      Yes  Denies fever  Protocols used: Headache-A-AH

## 2023-10-21 NOTE — Telephone Encounter (Signed)
 Appointment with Dr. Avelina 10/22/2023

## 2023-10-22 ENCOUNTER — Ambulatory Visit (INDEPENDENT_AMBULATORY_CARE_PROVIDER_SITE_OTHER): Admitting: Family Medicine

## 2023-10-22 VITALS — BP 100/80 | HR 114 | Temp 98.0°F | Ht 65.43 in | Wt 190.5 lb

## 2023-10-22 DIAGNOSIS — J0110 Acute frontal sinusitis, unspecified: Secondary | ICD-10-CM | POA: Insufficient documentation

## 2023-10-22 DIAGNOSIS — H5712 Ocular pain, left eye: Secondary | ICD-10-CM | POA: Diagnosis not present

## 2023-10-22 DIAGNOSIS — R000 Tachycardia, unspecified: Secondary | ICD-10-CM

## 2023-10-22 MED ORDER — PREDNISONE 20 MG PO TABS: ORAL_TABLET | ORAL | 0 refills | Status: AC

## 2023-10-22 MED ORDER — AMOXICILLIN 500 MG PO CAPS: 1000.0000 mg | ORAL_CAPSULE | Freq: Two times a day (BID) | ORAL | 0 refills | Status: AC

## 2023-10-22 NOTE — Assessment & Plan Note (Signed)
 Acute, likely secondary to likely nasal decongestant that patient is using.  Asked him to stop this medication.  Encouraged hydration.

## 2023-10-22 NOTE — Patient Instructions (Addendum)
 Stop any decongestant and nasal spray.  Start nasal saline (salt water) spray 1-2 times daily.   Start prednisone  taper and antibiotics course.  Call if any rash appears.  Call if not improving as expected in 48-72 hours... go to ER oif severe pain.  Tylenol  ES 2 tablet up to three times daily for pain.

## 2023-10-22 NOTE — Assessment & Plan Note (Addendum)
 Acute, no vision change or eye symptoms on exam.  Full normal range of motion of bilateral eyes.  No evidence of orbital or preorbital cellulitis.  No rash suggestive of shingles. Symptoms most likely secondary to sinusitis.

## 2023-10-22 NOTE — Progress Notes (Signed)
 Patient ID: Roy [name], male    DOB: 1945/04/15, 77 y.o.   MRN: [id number]  This visit was conducted in person.  BP 100/80   Pulse (!) 114   Temp 98 F (36.7 C) (Oral)   Ht 5' 5.43 (1.662 m)   Wt 190 lb 8 oz (86.4 kg)   SpO2 95%   BMI 31.29 kg/m    CC:  Chief Complaint  Patient presents with   Sinusitis    Over left eye x 3 days     Subjective:   HPI: Roy [name] is a 78 y.o. male presenting on 10/22/2023 for Sinusitis (Over left eye x 3 days/)   Date of onset:  1 week, worse in last 3 days Initial symptoms included  nasal congestion... clear nasal discharge  Wake up breathing through mouth, nose  swollen. Symptoms progressed to pain over left eye, frontal sinus  No fever.   No ear pain, no cough.  Some wheeze in AMs.   No eye watering.  No vision change   Recent eye exam 2 weeks ago.. stable.    Sick contacts:  none COVID testing:   none   Had COVID in July 2025   She has tried to treat with  tylenol  ES, zyrtec, nasal spray.. not sure if decongestant     No history of chronic lung disease such as asthma or COPD. Non-smoker.     Did have  dental surgery to remove 4 teeth, drill into left sinus,  summer 2025.  Relevant past medical, surgical, family and social history reviewed and updated as indicated. Interim medical history since our last visit reviewed. Allergies and medications reviewed and updated. Outpatient Medications Prior to Visit  Medication Sig Dispense Refill   aspirin  EC 81 MG tablet Take 81 mg by mouth daily. Swallow whole.     atorvastatin  (LIPITOR ) 80 MG tablet TAKE 1 TABLET BY MOUTH EVERY DAY 90 tablet 3   ezetimibe  (ZETIA ) 10 MG tablet Take 1 tablet (10 mg total) by mouth daily. 90 tablet 3   feeding supplement (ENSURE IMMUNE HEALTH) LIQD Take 237 mLs by mouth in the morning.     fenofibrate  (TRICOR ) 145 MG tablet TAKE 1 TABLET BY MOUTH EVERY DAY 90 tablet 3   fluocinonide (LIDEX) 0.05 % external solution Apply 1  application topically at bedtime as needed (psoriasis).     hydrocortisone  cream 1 % Apply 1 application topically 2 (two) times daily as needed for itching.     isosorbide  mononitrate (IMDUR ) 30 MG 24 hr tablet TAKE 1 TABLET BY MOUTH EVERY DAY 90 tablet 0   Magnesium  250 MG TABS Take 250 mg by mouth daily.     metoprolol  succinate (TOPROL -XL) 50 MG 24 hr tablet Take 1 tablet (50 mg total) by mouth daily. Take with or immediately following a meal. 90 tablet 3   nitroGLYCERIN  (NITROSTAT ) 0.4 MG SL tablet Place 1 tablet (0.4 mg total) under the tongue every 5 (five) minutes x 3 doses as needed for chest pain. 25 tablet 5   omeprazole  (PRILOSEC) 20 MG capsule TAKE 1 CAPSULE BY MOUTH EVERY DAY 90 capsule 3   tamsulosin  (FLOMAX ) 0.4 MG CAPS capsule TAKE 1 CAPSULE BY MOUTH EVERY DAY 90 capsule 3   traZODone  (DESYREL ) 50 MG tablet Take 0.5-1 tablets (25-50 mg total) by mouth at bedtime as needed. for sleep 90 tablet 1   triamcinolone (KENALOG) 0.1 % Apply 1 application topically 3 (three) times daily as needed (psoriasis).  venlafaxine  XR (EFFEXOR -XR) 75 MG 24 hr capsule TAKE 1 CAPSULE BY MOUTH DAILY WITH BREAKFAST. 90 capsule 1   No facility-administered medications prior to visit.     Per HPI unless specifically indicated in ROS section below Review of Systems  Constitutional:  Negative for fatigue and fever.  HENT:  Positive for congestion, sinus pressure and sinus pain. Negative for ear pain and sore throat.   Eyes:  Positive for pain. Negative for photophobia, discharge, redness, itching and visual disturbance.  Respiratory:  Negative for cough and shortness of breath.   Cardiovascular:  Negative for chest pain, palpitations and leg swelling.  Gastrointestinal:  Negative for abdominal pain.  Genitourinary:  Negative for dysuria.  Musculoskeletal:  Negative for arthralgias.  Neurological:  Negative for syncope, light-headedness and headaches.  Psychiatric/Behavioral:  Negative for dysphoric  mood.    Objective:  BP 100/80   Pulse (!) 114   Temp 98 F (36.7 C) (Oral)   Ht 5' 5.43 (1.662 m)   Wt 190 lb 8 oz (86.4 kg)   SpO2 95%   BMI 31.29 kg/m   Wt Readings from Last 3 Encounters:  10/22/23 190 lb 8 oz (86.4 kg)  08/10/23 182 lb (82.6 kg)  05/27/23 189 lb 6.4 oz (85.9 kg)      Physical Exam Vitals reviewed.  Constitutional:      General: He is not in acute distress.    Appearance: Normal appearance. He is well-developed. He is not ill-appearing or toxic-appearing.  HENT:     Head: Normocephalic and atraumatic.     Right Ear: Hearing, tympanic membrane, ear canal and external ear normal. No tenderness. No foreign body. Tympanic membrane is not retracted or bulging.     Left Ear: Hearing, tympanic membrane, ear canal and external ear normal. No tenderness. No foreign body. Tympanic membrane is not retracted or bulging.     Nose: Mucosal edema present. No rhinorrhea.     Right Turbinates: Enlarged.     Left Turbinates: Enlarged.     Right Sinus: No maxillary sinus tenderness or frontal sinus tenderness.     Left Sinus: No maxillary sinus tenderness or frontal sinus tenderness.     Mouth/Throat:     Dentition: Normal dentition. No dental caries.     Pharynx: Uvula midline. No oropharyngeal exudate.     Tonsils: No tonsillar abscesses.  Eyes:     General: Lids are normal. Lids are everted, no foreign bodies appreciated.     Extraocular Movements: Extraocular movements intact.     Right eye: Normal extraocular motion and no nystagmus.     Left eye: Normal extraocular motion and no nystagmus.     Conjunctiva/sclera: Conjunctivae normal.     Pupils: Pupils are equal, round, and reactive to light.  Neck:     Thyroid : No thyroid  mass or thyromegaly.     Vascular: No carotid bruit.     Trachea: Trachea and phonation normal.  Cardiovascular:     Rate and Rhythm: Normal rate and regular rhythm.     Pulses: Normal pulses.     Heart sounds: Normal heart sounds, S1  normal and S2 normal. Heart sounds not distant. No murmur heard.    No friction rub. No gallop.     Comments: No peripheral edema Pulmonary:     Effort: Pulmonary effort is normal. No respiratory distress.     Breath sounds: Normal breath sounds. No wheezing, rhonchi or rales.  Abdominal:     General: Bowel sounds are  normal.     Palpations: Abdomen is soft.     Tenderness: There is no abdominal tenderness. There is no guarding or rebound.     Hernia: No hernia is present.  Musculoskeletal:     Cervical back: Normal range of motion and neck supple.  Skin:    General: Skin is warm and dry.     Findings: No rash.  Neurological:     Mental Status: He is alert.     Deep Tendon Reflexes: Reflexes are normal and symmetric.  Psychiatric:        Speech: Speech normal.        Behavior: Behavior normal.        Thought Content: Thought content normal.        Judgment: Judgment normal.   This man    Results for orders placed or performed in visit on 08/10/23  POC COVID-19 [facility]   Collection Time: 08/10/23 10:25 AM  Result Value Ref Range   SARS Coronavirus 2 Ag Positive (A) Negative    Assessment and Plan  Acute left eye pain Assessment & Plan: Acute, no vision change or eye symptoms on exam.  Full normal range of motion of bilateral eyes.  No evidence of orbital or preorbital cellulitis.  No rash suggestive of shingles. Symptoms most likely secondary to sinusitis.   Acute non-recurrent frontal sinusitis Assessment & Plan: Acute, given significant worsening in the last few days, concern for bacterial superinfection. Recommend prednisone  taper, amoxicillin  500 mg 2 tablets twice daily for 10 days, nasal saline irrigation. Stop any decongestants.  Patient will let me know if symptoms or not improving as expected in the next 48 to 72 hours. Go to ER if severe pain or vision change.   Tachycardia Assessment & Plan: Acute, likely secondary to likely nasal decongestant that  patient is using.  Asked him to stop this medication.  Encouraged hydration.   Other orders -     predniSONE ; 3 tabs by mouth daily x 3 days, then 2 tabs by mouth daily x 2 days then 1 tab by mouth daily x 2 days  Dispense: 15 tablet; Refill: 0 -     Amoxicillin ; Take 2 capsules (1,000 mg total) by mouth 2 (two) times daily.  Dispense: 40 capsule; Refill: 0    No follow-ups on file.   Greig Ring, MD

## 2023-10-22 NOTE — Assessment & Plan Note (Signed)
 Acute, given significant worsening in the last few days, concern for bacterial superinfection. Recommend prednisone  taper, amoxicillin  500 mg 2 tablets twice daily for 10 days, nasal saline irrigation. Stop any decongestants.  Patient will let me know if symptoms or not improving as expected in the next 48 to 72 hours. Go to ER if severe pain or vision change.

## 2023-11-02 NOTE — Telephone Encounter (Unsigned)
 Copied from CRM #[id number]. Topic: Clinical - Medication Question >> Nov 02, 2023  4:09 PM Thersia BROCKS wrote: Reason for CRM: Patient called in regarding his medication, stated he stated prednisone  on 10 compteded on the 15th , stil have a minor headache so may need a refill

## 2023-11-03 NOTE — Telephone Encounter (Signed)
 In an effort to limit prednisone  I would suggest instead using Flonase 2 sprays per nostril daily  with as well as nasal saline or irrigation.

## 2023-11-04 NOTE — Telephone Encounter (Signed)
 Mr. Kleinsasser notified as instructed by telephone.  Patient states understanding.

## 2023-11-05 ENCOUNTER — Other Ambulatory Visit: Payer: Self-pay | Admitting: Family Medicine

## 2024-01-28 ENCOUNTER — Telehealth: Payer: Self-pay | Admitting: Cardiovascular Disease

## 2024-01-28 NOTE — Telephone Encounter (Signed)
" °*  STAT* If patient is at the pharmacy, call can be transferred to refill team.   1. Which medications need to be refilled? (please list name of each medication and dose if known)   isosorbide  mononitrate (IMDUR ) 30 MG 24 hr tablet     2. Which pharmacy/location (including street and city if local pharmacy) is medication to be sent to? [facility] [phone] [facility] - [place], [place] - [address]    3. Do they need a 30 day or 90 day supply?   30 day supply supply "

## 2024-02-01 ENCOUNTER — Other Ambulatory Visit: Payer: Self-pay

## 2024-02-03 MED ORDER — ISOSORBIDE MONONITRATE ER 30 MG PO TB24: 30.0000 mg | ORAL_TABLET | Freq: Every day | ORAL | 1 refills | Status: AC

## 2024-02-03 NOTE — Telephone Encounter (Signed)
 Pt's medication was sent to pt's pharmacy as requested. Confirmation received.

## 2024-02-08 MED ORDER — OMEPRAZOLE 20 MG PO CPDR: 20.0000 mg | DELAYED_RELEASE_CAPSULE | Freq: Every day | ORAL | 0 refills | Status: AC

## 2024-03-10 ENCOUNTER — Ambulatory Visit: Admitting: Cardiovascular Disease

## 2024-05-27 ENCOUNTER — Ambulatory Visit

## 2024-05-27 ENCOUNTER — Encounter: Admitting: Family Medicine
